# Patient Record
Sex: Male | Born: 1950
Health system: Southern US, Community
[De-identification: ages and names within clinical notes are randomized; demographics above are authoritative.]

## PROBLEM LIST (undated history)

## (undated) DIAGNOSIS — J189 Pneumonia, unspecified organism: Secondary | ICD-10-CM

## (undated) DIAGNOSIS — F1010 Alcohol abuse, uncomplicated: Secondary | ICD-10-CM

## (undated) DIAGNOSIS — T8859XA Other complications of anesthesia, initial encounter: Secondary | ICD-10-CM

## (undated) DIAGNOSIS — I509 Heart failure, unspecified: Secondary | ICD-10-CM

## (undated) DIAGNOSIS — E039 Hypothyroidism, unspecified: Secondary | ICD-10-CM

## (undated) DIAGNOSIS — I10 Essential (primary) hypertension: Secondary | ICD-10-CM

## (undated) DIAGNOSIS — C329 Malignant neoplasm of larynx, unspecified: Secondary | ICD-10-CM

## (undated) DIAGNOSIS — M1990 Unspecified osteoarthritis, unspecified site: Secondary | ICD-10-CM

## (undated) DIAGNOSIS — K7460 Unspecified cirrhosis of liver: Secondary | ICD-10-CM

## (undated) DIAGNOSIS — I779 Disorder of arteries and arterioles, unspecified: Secondary | ICD-10-CM

## (undated) DIAGNOSIS — N189 Chronic kidney disease, unspecified: Secondary | ICD-10-CM

## (undated) DIAGNOSIS — R569 Unspecified convulsions: Secondary | ICD-10-CM

## (undated) DIAGNOSIS — M87051 Idiopathic aseptic necrosis of right femur: Secondary | ICD-10-CM

## (undated) DIAGNOSIS — T4145XA Adverse effect of unspecified anesthetic, initial encounter: Secondary | ICD-10-CM

## (undated) DIAGNOSIS — E785 Hyperlipidemia, unspecified: Secondary | ICD-10-CM

## (undated) HISTORY — PX: TRACHEAL SURGERY: SHX1096

## (undated) HISTORY — DX: Pneumonia, unspecified organism: J18.9

## (undated) HISTORY — PX: TRACHEOSTOMY: SUR1362

## (undated) HISTORY — PX: ANKLE FRACTURE SURGERY: SHX122

## (undated) HISTORY — DX: Chronic kidney disease, unspecified: N18.9

---
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
## 2007-10-29 ENCOUNTER — Ambulatory Visit: Payer: Self-pay | Admitting: Family Medicine

## 2007-10-29 ENCOUNTER — Observation Stay (HOSPITAL_COMMUNITY): Admission: EM | Admit: 2007-10-29 | Discharge: 2007-10-30 | Payer: Self-pay | Admitting: Emergency Medicine

## 2007-10-30 LAB — CONVERTED CEMR LAB
HDL: 47 mg/dL
LDL Cholesterol: 90 mg/dL

## 2007-11-06 ENCOUNTER — Ambulatory Visit: Payer: Self-pay

## 2007-11-06 ENCOUNTER — Encounter: Payer: Self-pay | Admitting: Sports Medicine

## 2007-11-08 ENCOUNTER — Encounter: Payer: Self-pay | Admitting: Sports Medicine

## 2007-11-08 ENCOUNTER — Ambulatory Visit: Payer: Self-pay | Admitting: Family Medicine

## 2007-11-08 DIAGNOSIS — I10 Essential (primary) hypertension: Secondary | ICD-10-CM | POA: Insufficient documentation

## 2007-11-08 LAB — CONVERTED CEMR LAB
ALT: 20 U/L (ref 0–53)
AST: 33 units/L (ref 0–37)
Albumin: 4 g/dL (ref 3.5–5.2)
Alkaline Phosphatase: 132 units/L — ABNORMAL HIGH (ref 39–117)
BUN: 25 mg/dL — ABNORMAL HIGH (ref 6–23)
Bilirubin, Direct: 0.3 mg/dL (ref 0.0–0.3)
CO2: 25 meq/L (ref 19–32)
Calcium: 9.2 mg/dL (ref 8.4–10.5)
Chloride: 96 meq/L (ref 96–112)
Creatinine, Ser: 1.14 mg/dL
Creatinine, Ser: 1.14 mg/dL (ref 0.40–1.50)
Free T4: 0.95 ng/dL (ref 0.89–1.80)
Glucose, Bld: 88 mg/dL (ref 70–99)
Indirect Bilirubin: 1.2 mg/dL — ABNORMAL HIGH (ref 0.0–0.9)
PSA: 0.44 ng/mL (ref 0.10–4.00)
Potassium: 3.5 meq/L
Potassium: 3.5 meq/L (ref 3.5–5.3)
Sodium: 136 meq/L (ref 135–145)
T3, Free: 2.3 pg/mL (ref 2.3–4.2)
TSH: 3.527 u[IU]/mL (ref 0.350–4.50)
Total Bilirubin: 1.5 mg/dL — ABNORMAL HIGH (ref 0.3–1.2)
Total Protein: 8.3 g/dL (ref 6.0–8.3)

## 2007-11-09 DIAGNOSIS — I10 Essential (primary) hypertension: Secondary | ICD-10-CM | POA: Insufficient documentation

## 2007-11-21 ENCOUNTER — Encounter: Payer: Self-pay | Admitting: Sports Medicine

## 2007-12-30 ENCOUNTER — Observation Stay (HOSPITAL_COMMUNITY): Admission: EM | Admit: 2007-12-30 | Discharge: 2007-12-31 | Payer: Self-pay | Admitting: Emergency Medicine

## 2007-12-30 ENCOUNTER — Ambulatory Visit: Payer: Self-pay | Admitting: Family Medicine

## 2007-12-31 ENCOUNTER — Encounter: Payer: Self-pay | Admitting: Family Medicine

## 2007-12-31 DIAGNOSIS — J984 Other disorders of lung: Secondary | ICD-10-CM

## 2008-01-07 ENCOUNTER — Ambulatory Visit: Payer: Self-pay | Admitting: Family Medicine

## 2008-01-07 ENCOUNTER — Encounter: Payer: Self-pay | Admitting: *Deleted

## 2008-01-10 ENCOUNTER — Telehealth: Payer: Self-pay | Admitting: *Deleted

## 2008-11-14 ENCOUNTER — Encounter (INDEPENDENT_AMBULATORY_CARE_PROVIDER_SITE_OTHER): Payer: Self-pay | Admitting: *Deleted

## 2008-11-14 DIAGNOSIS — F17200 Nicotine dependence, unspecified, uncomplicated: Secondary | ICD-10-CM

## 2009-03-06 DIAGNOSIS — C329 Malignant neoplasm of larynx, unspecified: Secondary | ICD-10-CM

## 2009-03-06 HISTORY — DX: Malignant neoplasm of larynx, unspecified: C32.9

## 2009-04-16 ENCOUNTER — Ambulatory Visit: Payer: Self-pay | Admitting: Family Medicine

## 2009-04-16 ENCOUNTER — Encounter: Payer: Self-pay | Admitting: Sports Medicine

## 2009-04-16 LAB — CONVERTED CEMR LAB: Rapid Strep: NEGATIVE

## 2009-04-17 LAB — CONVERTED CEMR LAB
BUN: 9 mg/dL (ref 6–23)
CO2: 26 meq/L (ref 19–32)
Calcium: 8.6 mg/dL (ref 8.4–10.5)
Chloride: 100 meq/L (ref 96–112)
Creatinine, Ser: 0.7 mg/dL (ref 0.40–1.50)
Glucose, Bld: 82 mg/dL (ref 70–99)
Potassium: 3.3 meq/L — ABNORMAL LOW (ref 3.5–5.3)
Sodium: 141 meq/L (ref 135–145)

## 2009-04-19 ENCOUNTER — Ambulatory Visit (HOSPITAL_COMMUNITY): Admission: RE | Admit: 2009-04-19 | Discharge: 2009-04-19 | Payer: Self-pay | Admitting: Sports Medicine

## 2009-04-22 ENCOUNTER — Ambulatory Visit (HOSPITAL_COMMUNITY): Admission: RE | Admit: 2009-04-22 | Discharge: 2009-04-22 | Payer: Self-pay | Admitting: Sports Medicine

## 2009-04-22 ENCOUNTER — Encounter: Payer: Self-pay | Admitting: Sports Medicine

## 2009-04-30 ENCOUNTER — Ambulatory Visit: Payer: Self-pay | Admitting: Family Medicine

## 2009-04-30 ENCOUNTER — Encounter: Payer: Self-pay | Admitting: Sports Medicine

## 2009-05-03 ENCOUNTER — Encounter: Payer: Self-pay | Admitting: *Deleted

## 2009-05-10 ENCOUNTER — Encounter (INDEPENDENT_AMBULATORY_CARE_PROVIDER_SITE_OTHER): Payer: Self-pay | Admitting: *Deleted

## 2009-05-25 ENCOUNTER — Telehealth (INDEPENDENT_AMBULATORY_CARE_PROVIDER_SITE_OTHER): Payer: Self-pay | Admitting: *Deleted

## 2009-05-28 ENCOUNTER — Encounter: Payer: Self-pay | Admitting: Sports Medicine

## 2009-05-31 ENCOUNTER — Telehealth: Payer: Self-pay | Admitting: Sports Medicine

## 2009-06-14 ENCOUNTER — Telehealth: Payer: Self-pay | Admitting: Sports Medicine

## 2009-06-15 ENCOUNTER — Encounter: Payer: Self-pay | Admitting: Sports Medicine

## 2009-07-02 ENCOUNTER — Telehealth (INDEPENDENT_AMBULATORY_CARE_PROVIDER_SITE_OTHER): Payer: Self-pay | Admitting: *Deleted

## 2009-07-05 ENCOUNTER — Encounter: Payer: Self-pay | Admitting: Sports Medicine

## 2009-07-05 ENCOUNTER — Ambulatory Visit: Payer: Self-pay | Admitting: Family Medicine

## 2009-07-06 ENCOUNTER — Encounter: Payer: Self-pay | Admitting: Sports Medicine

## 2009-07-06 ENCOUNTER — Ambulatory Visit: Payer: Self-pay | Admitting: Family Medicine

## 2009-07-06 LAB — CONVERTED CEMR LAB
ALT: 14 U/L (ref 0–53)
AST: 27 U/L (ref 0–37)
Albumin: 3.4 g/dL — ABNORMAL LOW (ref 3.5–5.2)
Alkaline Phosphatase: 138 U/L — ABNORMAL HIGH (ref 39–117)
BUN: 20 mg/dL (ref 6–23)
CO2: 27 meq/L (ref 19–32)
Calcium: 9.2 mg/dL (ref 8.4–10.5)
Chloride: 95 meq/L — ABNORMAL LOW (ref 96–112)
Creatinine, Ser: 0.83 mg/dL (ref 0.40–1.50)
Glucose, Bld: 98 mg/dL (ref 70–99)
HCT: 38.5 % — ABNORMAL LOW (ref 39.0–52.0)
Hemoglobin: 13.5 g/dL (ref 13.0–17.0)
MCHC: 35.1 g/dL (ref 30.0–36.0)
MCV: 89.7 fL (ref 78.0–100.0)
Platelets: 236 10*3/uL (ref 150–400)
Potassium: 3.6 meq/L (ref 3.5–5.3)
RBC: 4.29 M/uL (ref 4.22–5.81)
RDW: 15.4 % (ref 11.5–15.5)
Sodium: 133 meq/L — ABNORMAL LOW (ref 135–145)
TSH: 2.399 u[IU]/mL (ref 0.350–4.500)
Total Bilirubin: 0.7 mg/dL (ref 0.3–1.2)
Total Protein: 7.4 g/dL (ref 6.0–8.3)
WBC: 6.6 10*3/microliter (ref 4.0–10.5)

## 2009-07-07 ENCOUNTER — Encounter: Payer: Self-pay | Admitting: Sports Medicine

## 2009-07-22 ENCOUNTER — Telehealth (INDEPENDENT_AMBULATORY_CARE_PROVIDER_SITE_OTHER): Payer: Self-pay | Admitting: Family Medicine

## 2009-07-23 ENCOUNTER — Encounter: Payer: Self-pay | Admitting: Sports Medicine

## 2009-08-12 ENCOUNTER — Encounter: Payer: Self-pay | Admitting: Sports Medicine

## 2009-09-08 ENCOUNTER — Encounter: Payer: Self-pay | Admitting: Sports Medicine

## 2009-09-14 ENCOUNTER — Encounter: Payer: Self-pay | Admitting: Sports Medicine

## 2009-09-23 ENCOUNTER — Encounter: Payer: Self-pay | Admitting: Sports Medicine

## 2009-09-29 ENCOUNTER — Encounter: Payer: Self-pay | Admitting: Sports Medicine

## 2009-10-05 ENCOUNTER — Ambulatory Visit
Admission: RE | Admit: 2009-10-05 | Discharge: 2010-01-03 | Payer: Self-pay | Source: Home / Self Care | Admitting: Radiation Oncology

## 2009-10-18 ENCOUNTER — Encounter: Payer: Self-pay | Admitting: Sports Medicine

## 2009-10-28 ENCOUNTER — Ambulatory Visit: Payer: Self-pay | Admitting: Oncology

## 2009-11-04 LAB — CBC WITH DIFFERENTIAL/PLATELET
BASO%: 0.4 % (ref 0.0–2.0)
Basophils Absolute: 0 10*3/uL (ref 0.0–0.1)
Eosinophils Absolute: 0.1 10*3/uL (ref 0.0–0.5)
HCT: 38 % — ABNORMAL LOW (ref 38.4–49.9)
HGB: 12.4 g/dL — ABNORMAL LOW (ref 13.0–17.1)
MCHC: 32.6 g/dL (ref 32.0–36.0)
MCV: 91.2 fL (ref 79.3–98.0)
Platelets: 169 10*3/uL (ref 140–400)
RBC: 4.17 10*6/uL — ABNORMAL LOW (ref 4.20–5.82)
RDW: 17.2 % — ABNORMAL HIGH (ref 11.0–14.6)
lymph#: 2.1 10*3/uL (ref 0.9–3.3)

## 2009-11-04 LAB — COMPREHENSIVE METABOLIC PANEL
Albumin: 3.3 g/dL — ABNORMAL LOW (ref 3.5–5.2)
BUN: 12 mg/dL (ref 6–23)
Calcium: 8.5 mg/dL (ref 8.4–10.5)
Chloride: 96 mEq/L (ref 96–112)
Glucose, Bld: 60 mg/dL — ABNORMAL LOW (ref 70–99)

## 2009-11-09 ENCOUNTER — Emergency Department (HOSPITAL_COMMUNITY): Admission: EM | Admit: 2009-11-09 | Discharge: 2009-11-09 | Payer: Self-pay | Admitting: Emergency Medicine

## 2009-11-09 LAB — COMPREHENSIVE METABOLIC PANEL
Calcium: 8.1 mg/dL — ABNORMAL LOW (ref 8.4–10.5)
Creatinine, Ser: 0.85 mg/dL (ref 0.40–1.50)
Potassium: 2.6 mEq/L — CL (ref 3.5–5.3)
Sodium: 134 mEq/L — ABNORMAL LOW (ref 135–145)

## 2009-11-10 LAB — HEPATITIS PANEL, ACUTE
HCV Ab: NEGATIVE
Hepatitis B Surface Ag: NEGATIVE

## 2009-11-12 ENCOUNTER — Ambulatory Visit (HOSPITAL_COMMUNITY): Admission: RE | Admit: 2009-11-12 | Discharge: 2009-11-12 | Payer: Self-pay | Admitting: Oncology

## 2009-11-19 LAB — HEPATIC FUNCTION PANEL
ALT: 125 U/L — ABNORMAL HIGH (ref 0–53)
AST: 95 U/L — ABNORMAL HIGH (ref 0–37)
Albumin: 3.1 g/dL — ABNORMAL LOW (ref 3.5–5.2)
Alkaline Phosphatase: 571 U/L — ABNORMAL HIGH (ref 39–117)
Total Bilirubin: 0.7 mg/dL (ref 0.3–1.2)

## 2009-11-19 LAB — BASIC METABOLIC PANEL
BUN: 8 mg/dL (ref 6–23)
CO2: 28 mEq/L (ref 19–32)
Chloride: 95 mEq/L — ABNORMAL LOW (ref 96–112)
Creatinine, Ser: 0.7 mg/dL (ref 0.40–1.50)
Glucose, Bld: 61 mg/dL — ABNORMAL LOW (ref 70–99)

## 2009-11-29 ENCOUNTER — Ambulatory Visit: Payer: Self-pay | Admitting: Oncology

## 2009-12-01 LAB — COMPREHENSIVE METABOLIC PANEL
ALT: 198 U/L — ABNORMAL HIGH (ref 0–53)
Albumin: 2.9 g/dL — ABNORMAL LOW (ref 3.5–5.2)
Alkaline Phosphatase: 560 U/L — ABNORMAL HIGH (ref 39–117)
BUN: 15 mg/dL (ref 6–23)
Calcium: 8.1 mg/dL — ABNORMAL LOW (ref 8.4–10.5)
Creatinine, Ser: 0.86 mg/dL (ref 0.40–1.50)
Potassium: 3.1 mEq/L — ABNORMAL LOW (ref 3.5–5.3)
Sodium: 137 mEq/L (ref 135–145)
Total Bilirubin: 0.9 mg/dL (ref 0.3–1.2)

## 2009-12-13 ENCOUNTER — Ambulatory Visit (HOSPITAL_COMMUNITY)
Admission: RE | Admit: 2009-12-13 | Discharge: 2009-12-13 | Payer: Self-pay | Source: Home / Self Care | Admitting: Oncology

## 2009-12-16 ENCOUNTER — Encounter: Payer: Self-pay | Admitting: Sports Medicine

## 2009-12-16 LAB — COMPREHENSIVE METABOLIC PANEL
ALT: 132 U/L — ABNORMAL HIGH (ref 0–53)
AST: 163 U/L — ABNORMAL HIGH (ref 0–37)
BUN: 4 mg/dL — ABNORMAL LOW (ref 6–23)
CO2: 29 mEq/L (ref 19–32)
Calcium: 8.5 mg/dL (ref 8.4–10.5)
Creatinine, Ser: 0.91 mg/dL (ref 0.40–1.50)
Total Protein: 7.5 g/dL (ref 6.0–8.3)

## 2010-01-03 ENCOUNTER — Ambulatory Visit
Admission: RE | Admit: 2010-01-03 | Discharge: 2010-01-07 | Payer: Self-pay | Source: Home / Self Care | Admitting: Radiation Oncology

## 2010-01-10 ENCOUNTER — Encounter: Payer: Self-pay | Admitting: Sports Medicine

## 2010-01-10 DIAGNOSIS — C329 Malignant neoplasm of larynx, unspecified: Secondary | ICD-10-CM

## 2010-01-19 ENCOUNTER — Ambulatory Visit
Admission: RE | Admit: 2010-01-19 | Discharge: 2010-01-19 | Payer: Self-pay | Source: Home / Self Care | Admitting: Radiation Oncology

## 2010-01-19 LAB — BUN: BUN: 6 mg/dL (ref 6–23)

## 2010-02-18 ENCOUNTER — Ambulatory Visit (HOSPITAL_COMMUNITY): Admission: RE | Admit: 2010-02-18 | Payer: Self-pay | Source: Home / Self Care | Admitting: Radiation Oncology

## 2010-03-14 ENCOUNTER — Encounter: Payer: Self-pay | Admitting: *Deleted

## 2010-03-25 ENCOUNTER — Other Ambulatory Visit: Payer: Self-pay | Admitting: Radiation Oncology

## 2010-03-26 ENCOUNTER — Encounter: Payer: Self-pay | Admitting: Oncology

## 2010-03-27 ENCOUNTER — Encounter: Payer: Self-pay | Admitting: Radiation Oncology

## 2010-03-27 ENCOUNTER — Encounter: Payer: Self-pay | Admitting: Oncology

## 2010-03-30 ENCOUNTER — Encounter: Payer: Self-pay | Admitting: Sports Medicine

## 2010-04-05 NOTE — Miscellaneous (Signed)
Summary: Disability Forms  Papers left to be filled out for disability.  Please fax when completed and call pt to pick up a copy. [name]  Jul 05, 2009 5:18 PM   tried to call pt to make appt to get these forms done. # is not working, other one he does not work there. [phone] did work. he will be here 5/12 to see md to fill these forms out.(fprms are in md chart box).Golden Circle RN  Jul 06, 2009 8:41 AM  Filled out and in to-do box. Rodney Langton MD  Jul 07, 2009 5:13 PM

## 2010-04-05 NOTE — Letter (Signed)
Summary: [facility] - Discharge Summary  [facility] - Discharge Summary   Imported By: De Nurse 10/21/2009 15:01:23  _____________________________________________________________________  External Attachment:    Type:   Image     Comment:   External Document

## 2010-04-05 NOTE — Consult Note (Signed)
Summary: [facility]   Imported By: Clydell Hakim 10/27/2009 11:36:57  _____________________________________________________________________  External Attachment:    Type:   Image     Comment:   External Document

## 2010-04-05 NOTE — Miscellaneous (Signed)
  Clinical Lists Changes  Problems: Removed problem of DYSPHAGIA UNSPECIFIED (ICD-787.20) Removed problem of SORE THROAT (ICD-462) Removed problem of SYNCOPE (ICD-780.2) Removed problem of CHEST PAIN, HX OF (ICD-V12.50) Removed problem of SPECIAL SCREENING FOR MALIGNANT NEOPLASMS COLON (ICD-V76.51) Removed problem of WEIGHT LOSS (ICD-783.21) Added new problem of NEOPLASM, MALIGNANT, LARYNX (ICD-161.9)

## 2010-04-05 NOTE — Consult Note (Signed)
Summary: [facility]   Imported By: De Nurse 09/15/2009 10:11:34  _____________________________________________________________________  External Attachment:    Type:   Image     Comment:   External Document

## 2010-04-05 NOTE — Progress Notes (Signed)
Summary: question  Phone Note Call from Patient Call back at [phone]   Caller: [name]- Daughter Summary of Call: has a secondary ins and wants to know if this will cover surgery?  Initial call taken by: De Nurse,  July 02, 2009 11:24 AM  Follow-up for Phone Call        left message to call back. Follow-up by: Theresia Lo RN,  July 02, 2009 12:16 PM  Additional Follow-up for Phone Call Additional follow up Details #1::        spoke with [name] and she has had new insurance card faxed to [place].  Insurance is [employer]. advised [name] to contact insurance company to see if there are any neurosurgeons in the area that participate with this insurance. Additional Follow-up by: Theresia Lo RN,  Jul 05, 2009 2:10 PM

## 2010-04-05 NOTE — Consult Note (Signed)
Summary[facility]   Imported By: Clydell Hakim 08/06/2009 16:19:26  _____________________________________________________________________  External Attachment:    Type:   Image     Comment:   External Document  Appended Document: [facility]    Clinical Lists Changes  Observations: Added new observation of PAST MED HX: Hypertension  04/19/09: Repeat CT chest: previous nodule stable, but multiple new nodules.  Centrilobular emphysema.  Repeat CT chest in 6-12 months needed.   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 (08/07/2009 10:11)       Past History:  Past Medical History: Hypertension  04/19/09: Repeat CT chest: previous nodule stable, but multiple new nodules.  Centrilobular emphysema.  Repeat CT chest in 6-12 months needed.   Primary Anatomic Dysphagia, to [facility]NT 07/2009, large tumor seen in supraglottic region, workup underway for tissue dx and staging.

## 2010-04-05 NOTE — Assessment & Plan Note (Signed)
Summary: sore throat,df   Vital Signs:  Patient profile:   60 year old male Weight:      114 pounds Temp:     97.4 degrees F oral Pulse rate:   102 / minute BP sitting:   147 / 104  (right arm) Cuff size:   regular  Vitals Entered By: Gladstone Pih (April 16, 2009 1:40 PM)  Serial Vital Signs/Assessments:  Time      Position  BP       Pulse  Resp  Temp     By 1:42 PM             152/102                        Gladstone Pih  Comments: 1:42 PM re check manually By: Gladstone Pih   CC: C/O sore throat X 1 month and Right earsche X 2 weeks Is Patient Diabetic? No Pain Assessment Patient in pain? no        Primary Care Provider:  Rodney Langton MD  CC:  C/O sore throat X 1 month and Right earsche X 2 weeks.  History of Present Illness: 74M  ST: x 1 month, trouble swallowing, feels like food gets stuck in neck.  Some reflux symptoms.  Pain in throat radiates to right ear.  No wt loss, fevers, chills.  Pulm nodule:  Seen on CTA in 2009, has not had repeat CT.  HTN: has not taken any of his BP meds in some time now.  Habits & Providers  Alcohol-Tobacco-Diet     Tobacco Status: current     Tobacco Counseling: to quit use of tobacco products     Cigarette Packs/Day: 0.5  Current Medications (verified): 1)  Metoprolol Tartrate 25 Mg Tabs (Metoprolol Tartrate) .... One Tab By Mouth Bid 2)  Nitroglycerin 0.4 Mg Subl (Nitroglycerin) .... 3 Tabs Sublingual As Needed For Chest Pain. 3)  Megestrol Acetate 400 Mg/71ml Susp (Megestrol Acetate) .Marland Kitchen.. 10 Ml By Mouth Daily Dispense Enough For One Month 4)  Adult Aspirin Ec Low Strength 81 Mg Tbec (Aspirin) .Marland Kitchen.. 1 Tab By Mouth Daily 5)  Nicotine 21-14-7 Mg/24hr Kit (Nicotine) .... Use Patches As Directed, Do Not Smoke When Using Patches 6)  Acetaminophen 650 Mg Cr-Tabs (Acetaminophen) .... One Tab By Mouth Q8h As Needed For Pain 7)  Prilosec 40 Mg Cpdr (Omeprazole) .... One Tab By Mouth Qhs  Allergies (verified): No Known  Drug Allergies  Social History: Packs/Day:  0.5  Review of Systems       See HPI  Physical Exam  General:  Well-developed,well-nourished,in no acute distress; alert,appropriate and cooperative throughout examination Head:  Normocephalic and atraumatic without obvious abnormalities.  Eyes:  No corneal or conjunctival inflammation noted. EOMI. Perrla.  Ears:  External ear exam shows no significant lesions or deformities.  Otoscopic examination reveals clear canals, tympanic membranes are intact bilaterally without bulging, retraction, inflammation or discharge. Hearing is grossly normal bilaterally. Nose:  External nasal examination shows no deformity or inflammation. Nasal mucosa are pink and moist without lesions or exudates. Mouth:  Oral mucosa and oropharynx without lesions or exudates.  Neck:  No deformities, masses, or tenderness noted. Lungs:  Normal respiratory effort, chest expands symmetrically. Lungs are clear to auscultation, no crackles or wheezes. Heart:  Normal rate and regular rhythm. S1 and S2 normal without gallop, murmur, click, rub or other extra sounds.   Impression & Recommendations:  Problem # 1:  SORE THROAT (  ICD-462) Assessment New Unclear etiology, strep neg, no URI symptoms.  Will tx for GERD and do MBS to assess anatomy (concern for Zenker Diverticulum).  If no improvement with PPI and neg MBS, would refer to ENT for direct laryngoscopy to assess for malignant lesion (smoker).  Acetaminophen for pain.  His updated medication list for this problem includes:    Acetaminophen 650 Mg Cr-tabs (Acetaminophen) ..... One tab by mouth q8h as needed for pain  Orders: Rapid Strep-[facility] (10272) [facility]- Est  Level 4 (99214) Mod Barium Swallow (Mod Barium Swallow)  Problem # 2:  DYSPHAGIA UNSPECIFIED (ICD-787.20) Assessment: New See Above: #1  Orders: [facility]- Est  Level 4 (99214) Mod Barium Swallow (Mod Barium Swallow)  Problem # 3:  TOBACCO USER  (ICD-305.1) Assessment: Unchanged 2 cigarettes/day.  Counselled to quit.  His updated medication list for this problem includes:    Nicotine 21-14-7 Mg/24hr Kit (Nicotine) ..... Use patches as directed, do not smoke when using patches  Orders: [facility]- Est  Level 4 (53664)  Problem # 4:  PULMONARY NODULE (ICD-518.89) Assessment: Unchanged Fu pulm nodules on CT in 2009.  Checking BMET today for contrast.  Orders: CT with Contrast (CT w/ contrast)  Problem # 5:  HYPERTENSION (ICD-401.9) Assessment: Deteriorated Pt not taking any meds.  BP elevated.  WIll start low dose toprol. BMET today.  His updated medication list for this problem includes:    Metoprolol Tartrate 25 Mg Tabs (Metoprolol tartrate) ..... One tab by mouth bid  Orders: Basic Met-[facility] (40347-42595) [facility]- Est  Level 4 (63875)  Complete Medication List: 1)  Metoprolol Tartrate 25 Mg Tabs (Metoprolol tartrate) .... One tab by mouth bid 2)  Nitroglycerin 0.4 Mg Subl (Nitroglycerin) .... 3 tabs sublingual as needed for chest pain. 3)  Megestrol Acetate 400 Mg/92ml Susp (Megestrol acetate) .Marland Kitchen.. 10 ml by mouth daily dispense enough for one month 4)  Adult Aspirin Ec Low Strength 81 Mg Tbec (Aspirin) .Marland Kitchen.. 1 tab by mouth daily 5)  Nicotine 21-14-7 Mg/24hr Kit (Nicotine) .... Use patches as directed, do not smoke when using patches 6)  Acetaminophen 650 Mg Cr-tabs (Acetaminophen) .... One tab by mouth q8h as needed for pain 7)  Prilosec 40 Mg Cpdr (Omeprazole) .... One tab by mouth qhs  Patient Instructions: 1)  Get the barium swallow. 2)  Take the prilosec 3)  Go to lab for bmet 4)  Make an appt to see me as soon as the barium swallow study is done. 5)  Take the new BP medicine metoprolol, baby aspirin a day, acetaminophen for pain. 6)  -Dr. Karie Schwalbe. Prescriptions: PRILOSEC 40 MG CPDR (OMEPRAZOLE) One tab by mouth qHS  #30 x 6   Entered and Authorized by:   Rodney Langton MD   Signed by:   Rodney Langton MD on  04/16/2009   Method used:   Electronically to        [facility] [id number] (retail)       [address] [place], [place]  [address]       Ph: [phone]       Fax: [phone]   RxID:   [id number] ACETAMINOPHEN 650 MG CR-TABS (ACETAMINOPHEN) One tab by mouth q8h as needed for pain  #30 x 0   Entered and Authorized by:   Rodney Langton MD   Signed by:   Rodney Langton MD on 04/16/2009   Method used:  Electronically to        [facility] [address] (retail)       [address] [place], [place]  [address]       Ph: [phone]       Fax: [phone]   RxID:   [id number] ADULT ASPIRIN EC LOW STRENGTH 81 MG TBEC (ASPIRIN) 1 tab by mouth daily  #90 x 11   Entered and Authorized by:   Rodney Langton MD   Signed by:   Rodney Langton MD on 04/16/2009   Method used:   Electronically to        [facility] [address] (retail)       [address]       [place], [place]  [address]       Ph: [phone]       Fax: [phone]   RxID:   [id number] METOPROLOL TARTRATE 25 MG TABS (METOPROLOL TARTRATE) One tab by mouth BID  #60 x 0   Entered and Authorized by:   Rodney Langton MD   Signed by:   Rodney Langton MD on 04/16/2009   Method used:   Electronically to        [facility] [address] (retail)       [address]       [place], [place]  [address]       Ph: [phone]       Fax: [phone]   RxID:   [id number]    Laboratory Results  Date/Time Received: April 16, 2009 1:50 PM  Date/Time Reported: April 16, 2009 1:59 PM   Other Tests  Rapid Strep: negative Comments: ...........test performed by...........Marland KitchenTerese Door, CMA

## 2010-04-05 NOTE — Miscellaneous (Signed)
Summary: Re: ENT referral.  Clinical Lists Changes patient has no insurance and he does have Debra Hill's number to get in touch with her to see if her qualifies. he requests we hold off on ENT referral until he has the chance to talk with Jaynee Eagles. will notifiy MD . Theresia Lo RN  May 10, 2009 11:00 AM   Noted, please have him let me know when he has spoken to her, referral order and letter are in [name] for when he's ready for referral. Rodney Langton MD  May 11, 2009 10:11 AM

## 2010-04-05 NOTE — Consult Note (Signed)
[facility]   Imported By: Clydell Hakim 12/27/2009 16:57:18  _____________________________________________________________________  External Attachment:    Type:   Image     Comment:   External Document

## 2010-04-05 NOTE — Miscellaneous (Signed)
 Summary: hospital summary  Clinical Lists Changes NAME:  [name], [name] NO.:  [id number]      MEDICAL RECORD NO.:  [id number]          PATIENT TYPE:  OBV      LOCATION:  4735                         FACILITY:  [facility]      PHYSICIAN:  Camie LITTIE Mulch, MD        DATE OF BIRTH:  1950-05-08      DATE OF ADMISSION:  10/29/2007   DATE OF DISCHARGE:  10/30/2007                                  DISCHARGE SUMMARY      PRIMARY CARE PROVIDER:  None.      REASON FOR ADMISSION:  Chest pain and hypertensive urgency.      BRIEF HISTORY:  This is a 60 year old male with chest pain that began   Friday, October 25, 2007, after picking the dehumidifier at work.   Gradually increased to 7/10 over the day, located over the left chest,   just below the nipple and radiates to the left flank and not associated   with physical exertion, worsened by palpation and seems to move when   the patient lies down.  Also, the patient complained of headache, some   nausea, vomiting x1 this morning.  He states what made him come to the   ED today was just that the pain had not gotten better.  Incidentally, he   was found to have a blood pressure of 217/120 in the ED and the family   practice teaching service was called.  The patient got labetalol  20 mg   IV x1 and a nitroglycerin  drip and then the blood pressure dropped to   187/110s in the ED.      BRIEF HOSPITAL COURSE:  The patient was brought on to the floor to rule   out MI.  He did well on the floor.  There were no acute events and by   discharge, the chest pain and headache had completely resolved.      PERTINENT LABORATORIES:  The patient had a fasting lipid panel done.   Total cholesterol was 145, triglycerides 39, HDL 47, and LDL 90.   Cardiac enzymes were negative x2 and urine drug screen was negative   except for cannabinoids.  EKG showed normal sinus rhythm.  Also of note,   the ED did a CT of the head for the patient's headache, which  came back   negative, and the patient's chest x-ray was also negative.  CBC and   complete metabolic panel was also negative.      DISCHARGE CONDITION:  Stable and improved.      DISCHARGE DISPOSITION:  The patient will be discharged to home.      DISCHARGE MEDICATIONS:  The patient took no medications on admission and   will be discharged with Lopressor HCT 25/100 PO daily, nitroglycerin    0.4mg  SL prn chest pain, and aspirin  81 mg p.o. daily.      INSTRUCTIONS:  No physical exertion until outpatient stress test.      FOLLOWUP:  He will follow up with me Dr. Curtis at the [facility] on November 08, 2007, at 3:30 p.m.  He will   follow up at [facility] for an outpatient stress test, November 06, 2007, at 12'o clock noon.  The number there is [phone].               Debby Petties, MD   Electronically Signed               Camie LITTIE Mulch, MD   Electronically Signed         TT/MEDQ  D:  10/30/2007  T:  10/30/2007  Job:  [id number]

## 2010-04-05 NOTE — Progress Notes (Signed)
Summary: referral  Phone Note Call from Patient Call back at [phone]   Caller: Patient Summary of Call: wants to know about referral Initial call taken by: De Nurse,  June 14, 2009 1:40 PM  Follow-up for Phone Call        explained to patient that we have sent referral to [facility] for Neurosurgeon and are still waiting to see if an appointment can be scheduled. no appointment with ENT were available to be scheduled due to lack of volunteer MD with this speciality.  he states that he is having much difficulty swallowing liquids and unable to eat. if he drinks water it burns and he gets strangled easily. he is taking in 3 Boost daily only. will send message to Dr. Benjamin Stain  to please advise.  Follow-up by: Theresia Lo RN,  June 14, 2009 2:15 PM  Additional Follow-up for Phone Call Additional follow up Details #1::        Very hungry, lots of trouble swallowing, can only take liquids.  Cannot swallow solids.  Advised to make appt to see me in a month, if hasn't gained weight would need to admit for FTT and consult ENT vs Neurosurg in the hospital. Additional Follow-up by: Rodney Langton MD,  June 14, 2009 3:26 PM

## 2010-04-05 NOTE — Progress Notes (Signed)
Summary: needs referral  Phone Note Call from Patient Call back at Home Phone [phone]   Caller: Patient Summary of Call: [facility] needs auth for him to go and he has appt tomorrow. Initial call taken by: De Nurse,  Jul 22, 2009 2:28 PM  Follow-up for Phone Call        Faxed referral to [phone] Follow-up by: Gladstone Pih,  Jul 22, 2009 4:43 PM

## 2010-04-05 NOTE — Letter (Signed)
Summary: Generic Letter  [facility]  [address]   [place], [place] [address]   Phone: [phone]  Fax: [phone]    05/03/2009  [name] [address] [place], [place]  [address]  Dear Mr. KONOPKA,     Your doctor wanted [place] to schedule an appointment with an Ear , Nose and Throat specialist. I was unable to contact you by phone. Please call our office at your earliest convenience. Also I need to ask if you have any insurance or  will you   be self pay before I make the referral.     Thank You.        Sincerely,   Theresia Lo RN

## 2010-04-05 NOTE — Assessment & Plan Note (Signed)
 Summary: np/h fu /per thekkandam/wp   Vital Signs:  Patient Profile:   60 Years Old Male Weight:      121.5 pounds Temp:     98.3 degrees F Pulse rate:   93 / minute BP sitting:   108 / 83  (left arm)  Vitals Entered By: LETITIA REUSING (November 08, 2007 3:40 PM)             Is Patient Diabetic? No     PCP:  Raela Bohl  Chief Complaint:  hospital f/u and meet new doctor.  History of Present Illness: 60 year old male with hypertension, chest pain, weight loss presents for hospital follow up after coming to the ED for chest pain, rule out MI, and hypertensive urgency.  He has only had one further episode of chest pain, relieved by his NTG.  He also completed his stress test at [facility].  I will have this faxed over so that I may address the results at his next visit.  No other complaints other than weight loss despite a good appetite.  No HA, blurry vision, weakness, cough, sore throat, SOB, N/V/D/C, melena, hematochezia, dysuria, night sweats.    Current Allergies: No known allergies   Past Medical History:    Hypertension  Past Surgical History:    The patient had a right ankle ORIF many years      ago at [facility]   Family History:    Father died of cancer of unknown type.  Mother has a      heart murmur and stroke.  Brother died of cancer.  Another brother died      of complications following esophageal surgery.  The patient has no      history of MI in the family.  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, lives with      his wife and 2 sons at their house, has a tobacco smoking history of one-      third of a pack per day for 30 years, drinks EtOH approximately 1 shot      on the weekends, and smokes marijuana occasionally   Risk Factors:  Tobacco use:  current    Counseled to quit/cut down tobacco use:  yes  HDL Result Date:  10/30/2007 HDL Result:  47 HDL Next Due:  1 yr LDL Result Date:  10/30/2007 LDL Result:   90 LDL Next Due:  1 yr Colonoscopy Next Due:  10 yr PSA Result Date:  11/08/2007 Creatinine Result Date: 11/08/2007 Creatinine Result:  1.14 Potassium Result Date:  11/08/2007 Potassium Result:  3.5   Review of Systems       As above in HPI   Physical Exam  General:     Well-developed,Appears very thin ,in no acute distress; alert,appropriate and cooperative throughout examination Head:     Normocephalic and atraumatic without obvious abnormalities. Eyes:     No corneal or conjunctival inflammation noted. EOMI. Perrla. Ears:     External ear exam shows no significant lesions or deformities. Nose:     External nasal examination shows no deformity or inflammation. Mouth:     Oral mucosa and oropharynx without lesions or exudates.  Neck:     No deformities, masses, or tenderness noted. Chest Wall:     No deformities, masses, tenderness or gynecomastia noted. Lungs:     Normal respiratory effort, chest expands symmetrically. Lungs are clear to auscultation, no crackles or wheezes. Heart:  Normal rate and regular rhythm. S1 and S2 normal without gallop, murmur, click, rub or other extra sounds. Abdomen:     Bowel sounds positive,abdomen soft and non-tender without masses, organomegaly or hernias noted. Extremities:     No clubbing, cyanosis, edema, or deformity noted with normal full range of motion of all joints.   Neurologic:     Grossly non-focal Cervical Nodes:     No lymphadenopathy noted Axillary Nodes:     No palpable lymphadenopathy Inguinal Nodes:     No significant adenopathy    Impression & Recommendations:  Problem # 1:  HYPERTENSION (ICD-401.9) Assessment: Improved Well controlled on current medication regimen, no report of side effects, no changes in medication.  His updated medication list for this problem includes:    Lopressor Hct 100-25 Mg Tabs (Metoprolol-hydrochlorothiazide) .SABRA... 1 tab by mouth daily   Problem # 2:  CHEST PAIN, HX OF  (ICD-V12.50) One episode since hospitalization.  relieved by NTG.  Will have reports of [place] stress test faxed over and added to chart.  Lipids and blood pressure controlled, risk factors optimized.  Pt is a smoker and I will address this at his next visit.  Problem # 3:  WEIGHT LOSS (ICD-783.21) Unsure as to cause, will do basic screening (colonoscopy scheduled, will check PSA, will consider CT chest with history of smoking but current concerns include pt's ability to pay for a CT, CXR negative but low sensitivity)  Will check results of TFTs at next visit.  Alk phos and T bili slightly elevated.  Will consider RUQ ultrasound to rule out biliary tract pathology/dilatation.  Will also try megestrol  for wt gain in the meantime.  Plan to plant PPD also at next visit.  Orders: Free T3-[facility] [id number]) Free T4-[facility] [id number]) TSH-[facility] [phone]) PSA-[facility] [id number]) Hepatic-[facility] [id number]) Basic Met-[facility] [id number])   Problem # 4:  SPECIAL SCREENING FOR MALIGNANT NEOPLASMS COLON (ICD-V76.51) Scheduled, will f/u results when available.  Orders: Colonoscopy (Colon)   Complete Medication List: 1)  Lopressor Hct 100-25 Mg Tabs (Metoprolol-hydrochlorothiazide) .SABRA.. 1 tab by mouth daily 2)  Nitroglycerin  0.4 Mg Subl (Nitroglycerin ) .... 3 tabs sublingual as needed for chest pain. 3)  Megestrol  Acetate 400 Mg/10ml Susp (Megestrol  acetate) .SABRA.. 10 ml by mouth daily dispense enough for one month 4)  Adult Aspirin  Ec Low Strength 81 Mg Tbec (Aspirin ) .SABRA.. 1 tab by mouth daily   Patient Instructions: 1)  It was good to see you again [name].  Your blood pressure is well controlled, so I will continue your current BP meds.  I will also draw a few labs including PSA today to assess your weight loss, depending on the results, I may need to order further tests.  I will also do a colonoscopy for routine screening.  Lastly I need the results of your stress test at [facility] cardiology.  I will  have this faxed over so its available for your next visit.   2)  lastly I will try a medication to improve your appetite while I search for a cause of your weight loss: megestrol  400 mg daily. 3)  See you again in one month. 4)  -Dr. Curtis   Prescriptions: ADULT ASPIRIN  EC LOW STRENGTH 81 MG TBEC (ASPIRIN ) 1 tab by mouth daily  #30 x 6   Entered and Authorized by:   DEBBY CURTIS MD   Signed by:   DEBBY CURTIS MD on 11/08/2007   Method used:   Electronically to  [facility] [address] (retail)       [address] [place], [place]  [address]       Ph: [phone]       Fax: [phone]   RxID:   [id number] MEGESTROL  ACETATE 400 MG/10ML SUSP (MEGESTROL  ACETATE) 10 mL by mouth daily dispense enough for one month  #300 x 3   Entered and Authorized by:   DEBBY PETTIES MD   Signed by:   DEBBY PETTIES MD on 11/08/2007   Method used:   Electronically to        [facility] [address] (retail)       [address] [place], [place]  [address]       Ph: [phone]       Fax: [phone]   RxID:   [id number] NITROGLYCERIN  0.4 MG SUBL (NITROGLYCERIN ) 3 tabs sublingual as needed for chest pain.  #90 x 6   Entered and Authorized by:   DEBBY PETTIES MD   Signed by:   DEBBY PETTIES MD on 11/08/2007   Method used:   Electronically to        [facility] [address] (retail)       [address] [place], [place]  [address]       Ph: [phone]       Fax: [phone]   RxID:   [id number] LOPRESSOR HCT 100-25 MG TABS (METOPROLOL-HYDROCHLOROTHIAZIDE) 1 tab by mouth daily  #30 x 6   Entered and Authorized by:   DEBBY PETTIES MD   Signed by:   DEBBY PETTIES MD on 11/08/2007   Method used:   Electronically to        [facility] [address] (retail)       [address]       [place], [place]  [address]        Ph: [phone]       Fax: [phone]   RxID:   [id number] ADULT ASPIRIN  EC LOW STRENGTH 81 MG TBEC (ASPIRIN ) 1 tab by mouth daily  #30 x 6   Entered and Authorized by:   DEBBY PETTIES MD   Signed by:   DEBBY PETTIES MD on 11/08/2007   Method used:   Electronically to        [facility] [address] (retail)       [address]       [place], [place]  [address]       Ph: [phone]       Fax: [phone]   RxID:   [id number] MEGESTROL  ACETATE 400 MG/10ML SUSP (MEGESTROL  ACETATE) 10 mL by mouth daily dispense enough for one month  #300 x 6   Entered and Authorized by:   DEBBY PETTIES MD   Signed by:   DEBBY PETTIES MD on 11/08/2007   Method used:   Electronically to        [facility] [address] (retail)       [address]       [place], [place]  [address]  Ph: [phone]       Fax: [phone]   RxID:   [id number] NITROGLYCERIN  0.4 MG SUBL (NITROGLYCERIN ) 3 tabs sublingual as needed for chest pain.  #90 x 6   Entered and Authorized by:   DEBBY PETTIES MD   Signed by:   DEBBY PETTIES MD on 11/08/2007   Method used:   Electronically to        [facility] [address] (retail)       [address] [place], [place]  [address]       Ph: [phone]       Fax: [phone]   RxID:   [id number] LOPRESSOR HCT 100-25 MG TABS (METOPROLOL-HYDROCHLOROTHIAZIDE) 1 tab by mouth daily  #30 x 6   Entered and Authorized by:   DEBBY PETTIES MD   Signed by:   DEBBY PETTIES MD on 11/08/2007   Method used:   Electronically to        [facility] [address] (retail)       [address]       [place], [place]  [address]       Ph: [phone]       Fax: [phone]   RxID:   [id number]  ]

## 2010-04-05 NOTE — Letter (Signed)
Summary: [facility]   [facility]   Imported By: Clydell Hakim 09/03/2009 13:51:07  _____________________________________________________________________  External Attachment:    Type:   Image     Comment:   External Document

## 2010-04-05 NOTE — Letter (Signed)
Summary: [facility] - Total Laryngectomy  [facility]   Imported By: Clydell Hakim 10/01/2009 15:13:45  _____________________________________________________________________  External Attachment:    Type:   Image     Comment:   External Document

## 2010-04-05 NOTE — Miscellaneous (Signed)
Summary: Re: neurosurgeon referral  Clinical Lists Changes   received notification from [facility] that they are unable to complete neurosurgeon referral at this time due to lack of volunteer physicians in this speciality group. They will notify patient when they can process this referral. Theresia Lo RN  June 15, 2009 8:58 AM  Noted, please notify patient. Rodney Langton MD  June 15, 2009 8:53 PM   patient notified. Theresia Lo RN  June 16, 2009 9:48 AM

## 2010-04-05 NOTE — Consult Note (Signed)
Summary: [facility] - Discharge Summary  [facility] - Discharge Summary   Imported By: Clydell Hakim 10/27/2009 12:14:17  _____________________________________________________________________  External Attachment:    Type:   Image     Comment:   External Document

## 2010-04-05 NOTE — Consult Note (Signed)
Summary: Functional Problems  Functional Problems   Imported By: De Nurse 05/12/2009 11:18:56  _____________________________________________________________________  External Attachment:    Type:   Image     Comment:   External Document

## 2010-04-05 NOTE — Assessment & Plan Note (Signed)
 Summary: h fu per [facility]   Vital Signs:  Patient Profile:   60 Years Old Male Weight:      123.3 pounds Pulse rate:   106 / minute BP sitting:   153 / 109  (right arm)  Pt. in pain?   no  Vitals Entered By: JACK BLOODGOOD CMA, (January 07, 2008 1:43 PM)              Is Patient Diabetic? No     PCP:  Rafel Garde  Chief Complaint:  hospital f/up.  History of Present Illness: Hospital follow up for syncope.  Pt states that he had been cooking and then felt weak, next thing he knew he was on the floor waking up.  No witnessed seizure, no chest pain, was ruled out for MI and PE during admission.  No prior cough, SOB, CP, fevers/chills, N/V/C/D, abd pain, HA, URI symptoms.  His vitals were stable (temp was 100.62F).  Unlikely cardiac event, CVA, or seizure.  Likely vasovagal.  Since admission, has had no further events, completely asymptomatic.  Is gaining weight (3 lbs since last office visit) eating well, but still smoking 3 cigarettes a day.  Says is cutting down slowly and would like to try the nicotine patch.      Current Allergies: No known allergies   Past Medical History:    Reviewed history from 11/08/2007 and no changes required:       Hypertension  Past Surgical History:    Reviewed history from 11/08/2007 and no changes required:       The patient had a right ankle ORIF many years         ago at [facility]   Family History:    Reviewed history from 11/08/2007 and no changes required:       Father died of cancer of unknown type.  Mother has a         heart murmur and stroke.  Brother died of cancer.  Another brother died         of complications following esophageal surgery.  The patient has no         history of MI in the family.  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[place], lives with         his wife and 2 sons at their house, has a tobacco smoking history of one-           third of a pack per day for 30 years, currently smokes 3 cigarettes a day, drinks EtOH approximately 1 shot on the weekends, and smokes marijuana occasionally   Risk Factors:     Counseled to quit/cut down tobacco use:  yes   Review of Systems       See HPI    Physical Exam  General:     no acute distress; alert,appropriate and cooperative throughout examination, thin appearing but better and fuller face than last time I saw him in the office Head:     Normocephalic and atraumatic without obvious abnormalities. Eyes:     No corneal or conjunctival inflammation noted. EOMI. Perrla. Ears:     External ear exam shows no significant lesions or deformities.  Nose:     External nasal examination shows no deformity or inflammation. Mouth:     Oral mucosa and oropharynx without lesions or exudates.  Poor dentition Neck:  No deformities, masses, or tenderness noted. Lungs:     Normal respiratory effort, chest expands symmetrically. Lungs are clear to auscultation, no crackles or wheezes. Heart:     Tachy but regular rhythm. S1 and S2 normal without gallop, murmur, click, rub or other extra sounds. Neurologic:     Grossly non-focal Cervical Nodes:     No lymphadenopathy noted Axillary Nodes:     No palpable lymphadenopathy Inguinal Nodes:     No significant adenopathy    Impression & Recommendations:  Problem # 1:  SYNCOPE (ICD-780.2) No further episodes, ruled out for MI, CVA, PE at admission.  Could have been 2/2 antihypertensive meds, vasovagal, dehydration, mild viral infection (T: 100.2 at admission).  Will hold off on tilt-table test for now, however if another episode occurs, will do it to rule out neurocardiogenic syncope.  Recommended adequate daily hydration. Orders: [facility]- Est  Level 4 (00785)   Problem # 2:  HYPERTENSION (ICD-401.9) BP meds held at discharge, BP elevated today 150/109, pulse 106, will restart Lopressor 100/25 but at 1/2 the dose.  Pt  will break tabs in half and take one half tab daily.  I will see him again in 2 weeks to see how his BP is doing  and track for further side effects of meds.  His updated medication list for this problem includes:    Lopressor Hct 100-25 Mg Tabs (Metoprolol-hydrochlorothiazide) ..... One half tab by mouth daily   Problem # 3:  PULMONARY NODULE (ICD-518.89) Found on CT scan at admission, see radiology report.  Will recheck CT in 3 months to track size of nodules.  Also recommended smoking cessation and will send script for nicotine patches to pharmacy.  Will plant PPD 3 days prior to next appt.  Problem # 4:  WEIGHT LOSS (ICD-783.21) Pt has gained 3 lbs since last office visit on megace , appetite better.  Will follow for continued improvement.  Problem # 5:  SPECIAL SCREENING FOR MALIGNANT NEOPLASMS COLON (ICD-V76.51) Pt currently unable to afford colonoscopy, will refer to [facility] for assistance, if qualified will immediately schedule colonoscopy, currently no alarm symptoms (constipation, pencil stools, melena, hematochezia).  Complete Medication List: 1)  Lopressor Hct 100-25 Mg Tabs (Metoprolol-hydrochlorothiazide) .... One half tab by mouth daily 2)  Nitroglycerin  0.4 Mg Subl (Nitroglycerin ) .... 3 tabs sublingual as needed for chest pain. 3)  Megestrol  Acetate 400 Mg/10ml Susp (Megestrol  acetate) .SABRA.. 10 ml by mouth daily dispense enough for one month 4)  Adult Aspirin  Ec Low Strength 81 Mg Tbec (Aspirin ) .SABRA.. 1 tab by mouth daily 5)  Nicotine 21-14-7 Mg/24hr Kit (Nicotine) .... Use patches as directed, do not smoke when using patches   Patient Instructions: 1)  -Good to see you again. 2)  -Your blood pressure today was very high.  Take your blood pressure meds again, but this time break the pill in half and take once a day.   3)  -I will send you to [facility]. 4)  -Once [facility] qualified, we can get you a colonoscopy. 5)  -I plan to do another Chest CT scan in 3 months  (03/31/08) to track the size of the nodules found in your lungs. 6)  -Keep yourself well hydrated 7)  -Have my nurses make an appt to see me again in 2 weeks. 8)  -Dr. ONEIDA.   Prescriptions: NICOTINE 21-14-7 MG/24HR KIT (NICOTINE) Use patches as directed, do not smoke when using patches  #1 x 0   Entered and Authorized  by:   DEBBY PETTIES MD   Signed by:   DEBBY PETTIES MD on 01/07/2008   Method used:   Electronically to        [facility] [address] (retail)       [address] [place], [place]  [address]       Ph: [phone]       Fax: [phone]   RxID:   [id number]  ]

## 2010-04-05 NOTE — Letter (Signed)
Summary: Disability Paperwork  Medical Statement Mental Ability   Imported By: Clydell Hakim 08/06/2009 16:25:57  _____________________________________________________________________  External Attachment:    Type:   Image     Comment:   External Document

## 2010-04-05 NOTE — Progress Notes (Signed)
Summary: referral  Phone Note Call from Patient Call back at [phone]   Caller: Patient Summary of Call: pt is now ready to go to ENT- pls call when referral is ready pt has [facility] Initial call taken by: De Nurse,  May 25, 2009 9:40 AM  Follow-up for Phone Call        spoke with male person answering phone and she will bring card in for a copy to be made. Follow-up by: Theresia Lo RN,  May 25, 2009 10:17 AM

## 2010-04-05 NOTE — Consult Note (Signed)
Summary: [facility]   Imported By: Clydell Hakim 10/12/2009 14:43:18  _____________________________________________________________________  External Attachment:    Type:   Image     Comment:   External Document

## 2010-04-05 NOTE — Progress Notes (Signed)
Summary: referral  Phone Note Call from Patient Call back at [phone]   Caller: Shirl Harris- foster daughter Summary of Call: wants to know how much this surgery will cost Initial call taken by: De Nurse,  May 31, 2009 2:58 PM  Follow-up for Phone Call        message left to return call. Follow-up by: Theresia Lo RN,  May 31, 2009 3:20 PM  Additional Follow-up for Phone Call Additional follow up Details #1::        explained  to Adonia that we have no estimate of cost . the surgeons are the ones that would determine this cost. she wants to know the name of the surgery that he is going to need. . she wants to try to get him insurance and needs this information. will send message to MD to ask if he can give name of surgery . will then relay message to patient. Additional Follow-up by: Theresia Lo RN,  June 01, 2009 9:01 AM    Additional Follow-up for Phone Call Additional follow up Details #2::    No idea, again, this would be the decision on the surgeons. Follow-up by: Rodney Langton MD,  June 01, 2009 10:36 AM  Additional Follow-up for Phone Call Additional follow up Details #3:: Details for Additional Follow-up Action Taken:  above message left on voicemail. Additional Follow-up by: Theresia Lo RN,  June 01, 2009 11:26 AM

## 2010-04-05 NOTE — Miscellaneous (Signed)
Summary: Re: ENT referral  Clinical Lists Changes   received notification from [facility] that they are unable to complete ENT referral at this time due to lack of physicians in this speciality group. they will notify patient when they can process the referral. Theresia Lo RN  May 28, 2009 1:53 PM  Noted, thanks.  Is there any way we can try a neurosurgeon as the primary problem does involve vertebral spurs?  Let me know if i need to put in the referral. Rodney Langton MD  May 28, 2009 1:57 PM   yes put in referral. Theresia Lo RN  May 28, 2009 4:28 PM  Done, Rodney Langton MD  May 28, 2009 5:50 PM

## 2010-04-05 NOTE — Letter (Signed)
Summary: *Referral Letter ENT  [facility]  [address]   [place], [place] [address]   Phone: [phone]  Fax: [phone]    04/30/2009  Thank you in advance for agreeing to see my patient:  [name] [address] [place], [place]  [address]  Phone: [phone]  Reason for Referral: This is a 60 year old male with the below past medical history, long history of difficulty swallowing described as food getting stuck in upper esophagus.  He has been losing weight as well.  No palliation with PPI.  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  This prevented full epiglottal deflection/inversion.  Detailed report is available in E-chart under VIEWER>MBSS MD VIEW.  A scanned copy of the report will also be included with this referral letter.  Procedures Requested: Assistance in management of the above issue.  Current Medications: 1)  METOPROLOL TARTRATE 25 MG TABS (METOPROLOL TARTRATE) Two tabs by mouth two times a day 2)  NITROGLYCERIN 0.4 MG SUBL (NITROGLYCERIN) 3 tabs sublingual as needed for chest pain. 3)  MEGESTROL ACETATE 400 MG/10ML SUSP (MEGESTROL ACETATE) 10 mL by mouth daily dispense enough for one month 4)  ADULT ASPIRIN EC LOW STRENGTH 81 MG TBEC (ASPIRIN) 1 tab by mouth daily 5)  NICOTINE 21-14-7 MG/24HR KIT (NICOTINE) Use patches as directed, do not smoke when using patches 6)  ACETAMINOPHEN 650 MG CR-TABS (ACETAMINOPHEN) One tab by mouth q8h as needed for pain 7)  PRILOSEC 40 MG CPDR (OMEPRAZOLE) One tab by mouth qHS 8)  KLOR-CON M20 20 MEQ CR-TABS (POTASSIUM CHLORIDE CRYS CR) One tab by mouth daily   Past Medical History: 1)  Hypertension 2)  04/19/09: Repeat CT chest: previous nodule stable, but multiple new nodules.  Centrilobular emphysema.  Repeat CT chest in 6-12 months needed.    Thank you again  for agreeing to see our patient; please contact us if you have any further questions or need additional information.  Sincerely,  Rodney Langton MD

## 2010-04-05 NOTE — Letter (Signed)
Summary: [facility]  [facility]   Imported By: Clydell Hakim 08/19/2009 13:56:44  _____________________________________________________________________  External Attachment:    Type:   Image     Comment:   External Document  Appended Document: [facility]    Clinical Lists Changes  Observations: Added new observation of PAST MED HX: Hypertension  04/19/09: Repeat CT chest: previous nodule stable, but multiple new nodules.  Centrilobular emphysema.  Repeat CT chest in 6-12 months needed.   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 (08/19/2009 14:06)       Past History:  Past Medical History: Hypertension  04/19/09: Repeat CT chest: previous nodule stable, but multiple new nodules.  Centrilobular emphysema.  Repeat CT chest in 6-12 months needed.   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

## 2010-04-05 NOTE — Assessment & Plan Note (Signed)
Summary: losing more weight,tcb   Vital Signs:  Patient profile:   60 year old male Weight:      106.8 pounds Temp:     98.1 degrees F oral Pulse rate:   112 / minute BP sitting:   123 / 82  (left arm) Cuff size:   regular  Vitals Entered By: Gladstone Pih (Jul 05, 2009 3:23 PM) CC: C/O losing more wt Is Patient Diabetic? No Pain Assessment Patient in pain? no        Primary Care Provider:  Rodney Langton MD  CC:  C/O losing more wt.  History of Present Illness: 54M  ST: x 2.5 months, trouble swallowing, feels like food gets stuck in neck.  Some reflux symptoms.  Pain in throat radiates to right ear.  No wt loss, fevers, chills.  Swallow eval done, noted to have severe ant vertebral spurs that obstructed epiglottal movement. (see eval in echart).  No ENT in [place] would see him.  Pulm nodule:  repeat CT showed old nodules stable, several new nodules, recommended repeat CT chest in 6-12 mos.  HTN: Controlled  Habits & Providers  Alcohol-Tobacco-Diet     Tobacco Status: current     Tobacco Counseling: to quit use of tobacco products     Cigarette Packs/Day: <0.25  Current Medications (verified): 1)  Metoprolol Tartrate 50 Mg Tabs (Metoprolol Tartrate) .... One Tab By Mouth Bid 2)  Nitroglycerin 0.4 Mg Subl (Nitroglycerin) .... 3 Tabs Sublingual As Needed For Chest Pain. 3)  Megestrol Acetate 400 Mg/48ml Susp (Megestrol Acetate) .Marland Kitchen.. 10 Ml By Mouth Daily Dispense Enough For One Month 4)  Adult Aspirin Ec Low Strength 81 Mg Tbec (Aspirin) .Marland Kitchen.. 1 Tab By Mouth Daily 5)  Nicotine 21-14-7 Mg/24hr Kit (Nicotine) .... Use Patches As Directed, Do Not Smoke When Using Patches 6)  Acetaminophen 650 Mg Cr-Tabs (Acetaminophen) .... One Tab By Mouth Q8h As Needed For Pain 7)  Prilosec 40 Mg Cpdr (Omeprazole) .... One Tab By Mouth Qhs 8)  Klor-Con M20 20 Meq Cr-Tabs (Potassium Chloride Crys Cr) .... One Tab By Mouth Daily  Allergies (verified): No Known Drug  Allergies  Past History:  Past Medical History: Hypertension  04/19/09: Repeat CT chest: previous nodule stable, but multiple new nodules.  Centrilobular emphysema.  Repeat CT chest in 6-12 months needed.   Primary Anatomic Dysphagia  Social History: Packs/Day:  <0.25  Physical Exam  General:  Thin, WD/WH Lungs:  Normal respiratory effort, chest expands symmetrically. Lungs are clear to auscultation, no crackles or wheezes. Heart:  Normal rate and regular rhythm. S1 and S2 normal without gallop, murmur, click, rub or other extra sounds.   Impression & Recommendations:  Problem # 1:  DYSPHAGIA UNSPECIFIED (ICD-787.20) Assessment Unchanged [facility] E[facility] will see pt on May 20.  I want to see him after to discuss plans.  Neurosurg couldn't see pt until September.  Pt amenable to May 20 ENT visit.  Will send referral letter and MBSS reports.  Orders: ENT Referral (ENT)  Future Orders: Comp Met-[facility] [id number]) ... 07/06/2010 CBC-[facility] ([id number]) ... 07/06/2010 TSH-[facility] [id number]) ... 07/06/2010  Problem # 2:  WEIGHT LOSS (ICD-783.21) Assessment: Deteriorated Checking the below labs (pt to come back for appt).  Likely cause is the anatomic dysphagia noted on MBSS.  Orders: ENT Referral (ENT) [facility]- Est  Level 4 (99214)Future Orders: Comp Met-[facility] ([id number]) ... 07/06/2010 CBC-[facility] ([id number]) ... 07/06/2010 TSH-[facility] [id number]) ... 07/06/2010  Problem # 3:  PULMONARY NODULE (ICD-518.89) Assessment: Unchanged  Repeat  CT chest will be needed in 04/2010.  Orders: [facility]- Est  Level 4 (25956)  Problem # 4:  HYPERTENSION (ICD-401.9) Assessment: Improved Controlled on current meds.  His updated medication list for this problem includes:    Metoprolol Tartrate 50 Mg Tabs (Metoprolol tartrate) ..... One tab by mouth bid  Complete Medication List: 1)  Metoprolol Tartrate 50 Mg Tabs (Metoprolol tartrate) .... One tab by mouth bid 2)  Nitroglycerin 0.4 Mg Subl (Nitroglycerin) .... 3  tabs sublingual as needed for chest pain. 3)  Megestrol Acetate 400 Mg/90ml Susp (Megestrol acetate) .Marland Kitchen.. 10 ml by mouth daily dispense enough for one month 4)  Adult Aspirin Ec Low Strength 81 Mg Tbec (Aspirin) .Marland Kitchen.. 1 tab by mouth daily 5)  Nicotine 21-14-7 Mg/24hr Kit (Nicotine) .... Use patches as directed, do not smoke when using patches 6)  Acetaminophen 650 Mg Cr-tabs (Acetaminophen) .... One tab by mouth q8h as needed for pain 7)  Prilosec 40 Mg Cpdr (Omeprazole) .... One tab by mouth qhs 8)  Klor-con M20 20 Meq Cr-tabs (Potassium chloride crys cr) .... One tab by mouth daily  Patient Instructions: 1)  Great to see you today, 2)  Got ENT at [facility] to see you.  May 20th. 3)  Come back to see me IMMEDIATELY after your visit with them to discuss plans. 4)  Come back for a lab visit. 5)  -Dr. Karie Schwalbe.         CMP ordered

## 2010-04-05 NOTE — Assessment & Plan Note (Signed)
Summary: f/up,tcb   Vital Signs:  Patient profile:   60 year old male Weight:      116.3 pounds Temp:     98.6 degrees F oral Pulse rate:   97 / minute BP sitting:   162 / 106  (left arm)  Vitals Entered By: Loralee Pacas CMA (April 30, 2009 2:12 PM)  Primary Care Provider:  Rodney Langton MD   History of Present Illness: 42M  ST: x 1.5 months, trouble swallowing, feels like food gets stuck in neck.  Some reflux symptoms.  Pain in throat radiates to right ear.  No wt loss, fevers, chills.  Swallow eval done, noted to have severe ant vertebral spurs that obstructed epiglottal movement. (see eval in echart)  Pulm nodule:  repeat CT showed old nodules stable, several new nodules, recommended repeat CT chest in 6-12 mos.  HTN: elevated, took all meds.  Flu shot.  Current Medications (verified): 1)  Metoprolol Tartrate 25 Mg Tabs (Metoprolol Tartrate) .... Two Tabs By Mouth Two Times A Day 2)  Nitroglycerin 0.4 Mg Subl (Nitroglycerin) .... 3 Tabs Sublingual As Needed For Chest Pain. 3)  Megestrol Acetate 400 Mg/39ml Susp (Megestrol Acetate) .Marland Kitchen.. 10 Ml By Mouth Daily Dispense Enough For One Month 4)  Adult Aspirin Ec Low Strength 81 Mg Tbec (Aspirin) .Marland Kitchen.. 1 Tab By Mouth Daily 5)  Nicotine 21-14-7 Mg/24hr Kit (Nicotine) .... Use Patches As Directed, Do Not Smoke When Using Patches 6)  Acetaminophen 650 Mg Cr-Tabs (Acetaminophen) .... One Tab By Mouth Q8h As Needed For Pain 7)  Prilosec 40 Mg Cpdr (Omeprazole) .... One Tab By Mouth Qhs 8)  Klor-Con M20 20 Meq Cr-Tabs (Potassium Chloride Crys Cr) .... One Tab By Mouth Daily  Allergies (verified): No Known Drug Allergies  Past History:  Past Medical History: Hypertension  04/19/09: Repeat CT chest: previous nodule stable, but multiple new nodules.  Centrilobular emphysema.  Repeat CT chest in 6-12 months needed.   Review of Systems       See HPI  Physical Exam  General:  Well-developed,well-nourished,in no acute  distress; alert,appropriate and cooperative throughout examination Neck:  No deformities, masses, or tenderness noted. Lungs:  Normal respiratory effort, chest expands symmetrically. Lungs are clear to auscultation, no crackles or wheezes. Heart:  Normal rate and regular rhythm. S1 and S2 normal without gallop, murmur, click, rub or other extra sounds.   Impression & Recommendations:  Problem # 1:  DYSPHAGIA UNSPECIFIED (ICD-787.20) Assessment Unchanged ENT referral, see Swallow eval in echart, report will be scanned into his chart as well and sent to ENT.  Orders: ENT Referral (ENT) [facility]- Est  Level 4 (65784)  Problem # 2:  PULMONARY NODULE (ICD-518.89) Assessment: Unchanged Repeat CT chest in 6-12 months.  Orders: [facility]- Est  Level 4 (69629)  Problem # 3:  HYPERTENSION (ICD-401.9) Assessment: Deteriorated uncontrolled, question with a HR of 90 if he is truly beta blocked.  Will double metoprolol. Can change to 50mg  two times a day when he runs out  of 25mg .  His updated medication list for this problem includes:    Metoprolol Tartrate 25 Mg Tabs (Metoprolol tartrate) .Marland Kitchen..Marland Kitchen Two tabs by mouth two times a day  Orders: [facility]- Est  Level 4 (52841)  Complete Medication List: 1)  Metoprolol Tartrate 25 Mg Tabs (Metoprolol tartrate) .... Two tabs by mouth two times a day 2)  Nitroglycerin 0.4 Mg Subl (Nitroglycerin) .... 3 tabs sublingual as needed for chest pain. 3)  Megestrol Acetate 400 Mg/57ml Susp (  Megestrol acetate) .Marland Kitchen.. 10 ml by mouth daily dispense enough for one month 4)  Adult Aspirin Ec Low Strength 81 Mg Tbec (Aspirin) .Marland Kitchen.. 1 tab by mouth daily 5)  Nicotine 21-14-7 Mg/24hr Kit (Nicotine) .... Use patches as directed, do not smoke when using patches 6)  Acetaminophen 650 Mg Cr-tabs (Acetaminophen) .... One tab by mouth q8h as needed for pain 7)  Prilosec 40 Mg Cpdr (Omeprazole) .... One tab by mouth qhs 8)  Klor-con M20 20 Meq Cr-tabs (Potassium chloride crys cr) .... One tab  by mouth daily  Patient Instructions: 1)  ENT referral 2)  Double your metoprolol to two 25mg  tabs two times a day. I will refill with 50mg  tabs when you run out. 3)  Come back to see me after your ENT visit. 4)  -Dr. Karie Schwalbe.  Last Creatinine:  0.70 (04/16/2009 8:55:00 PM) Creatinine Next Due: 1 yr Last Potassium:  3.3 (04/16/2009 8:55:00 PM) Potassium Next Due:  1 yr

## 2010-04-08 NOTE — Consult Note (Signed)
Summary: MBSS Report  MBSS Report   Imported By: De Nurse 05/12/2009 11:40:17  _____________________________________________________________________  External Attachment:    Type:   Image     Comment:   External Document

## 2010-04-13 NOTE — Consult Note (Signed)
Summary: [facility] - Otol[facility]  [facility] - [facility]   Imported By: De Nurse 04/08/2010 14:20:34  _____________________________________________________________________  External Attachment:    Type:   Image     Comment:   External Document

## 2010-04-13 NOTE — Consult Note (Signed)
Summary: [facility] - Otolaryngology  [facility] - Otolaryngology   Imported By: De Nurse 04/05/2010 17:04:02  _____________________________________________________________________  External Attachment:    Type:   Image     Comment:   External Document

## 2010-04-18 ENCOUNTER — Other Ambulatory Visit (HOSPITAL_COMMUNITY): Payer: Self-pay

## 2010-04-28 ENCOUNTER — Inpatient Hospital Stay (HOSPITAL_COMMUNITY): Admission: RE | Admit: 2010-04-28 | Payer: Self-pay | Source: Ambulatory Visit

## 2010-05-12 ENCOUNTER — Ambulatory Visit: Payer: PRIVATE HEALTH INSURANCE | Admitting: Radiation Oncology

## 2010-05-13 ENCOUNTER — Ambulatory Visit: Payer: PRIVATE HEALTH INSURANCE | Admitting: Radiation Oncology

## 2010-05-19 ENCOUNTER — Encounter: Payer: PRIVATE HEALTH INSURANCE | Admitting: Oncology

## 2010-05-19 ENCOUNTER — Ambulatory Visit (HOSPITAL_COMMUNITY)
Admission: RE | Admit: 2010-05-19 | Discharge: 2010-05-19 | Disposition: A | Discharge status: Home / Self Care | Payer: PRIVATE HEALTH INSURANCE | Source: Ambulatory Visit | Attending: Radiation Oncology | Admitting: Radiation Oncology

## 2010-05-19 ENCOUNTER — Encounter (HOSPITAL_COMMUNITY): Payer: Self-pay

## 2010-05-19 DIAGNOSIS — C329 Malignant neoplasm of larynx, unspecified: Secondary | ICD-10-CM | POA: Insufficient documentation

## 2010-05-19 DIAGNOSIS — J438 Other emphysema: Secondary | ICD-10-CM | POA: Insufficient documentation

## 2010-05-19 HISTORY — DX: Essential (primary) hypertension: I10

## 2010-05-19 LAB — CREATININE, URINE, RANDOM: Creatinine, Urine: 138.7 mg/dL

## 2010-05-19 LAB — CBC
HCT: 37 % — ABNORMAL LOW (ref 39.0–52.0)
RDW: 17.3 % — ABNORMAL HIGH (ref 11.5–15.5)
WBC: 3.6 10*3/uL — ABNORMAL LOW (ref 4.0–10.5)

## 2010-05-19 LAB — DIFFERENTIAL
Eosinophils Relative: 1 % (ref 0–5)
Monocytes Relative: 8 % (ref 3–12)
Neutrophils Relative %: 37 % — ABNORMAL LOW (ref 43–77)

## 2010-05-19 LAB — BASIC METABOLIC PANEL
BUN: 12 mg/dL (ref 6–23)
Calcium: 8.1 mg/dL — ABNORMAL LOW (ref 8.4–10.5)
Creatinine, Ser: 0.79 mg/dL (ref 0.4–1.5)
GFR calc Af Amer: >60 mL/min (ref >60)
GFR calc non Af Amer: >60 mL/min (ref >60)

## 2010-05-19 LAB — GLUCOSE, CAPILLARY: Glucose-Capillary: 173 mg/dL — ABNORMAL HIGH (ref 70–99)

## 2010-05-19 LAB — SODIUM, URINE, RANDOM: Sodium, Ur: 44 mEq/L

## 2010-05-19 LAB — MAGNESIUM: Magnesium: 2 mg/dL (ref 1.5–2.5)

## 2010-05-19 MED ORDER — IOHEXOL 300 MG/ML  SOLN
80.0000 mL | Freq: Once | INTRAMUSCULAR | Status: AC | PRN
  Administered 2010-05-19: 80 mL via INTRAVENOUS

## 2010-05-26 ENCOUNTER — Ambulatory Visit: Payer: PRIVATE HEALTH INSURANCE | Attending: Radiation Oncology | Admitting: Radiation Oncology

## 2010-07-19 NOTE — H&P (Signed)
NAMEJAECE, [name] NO.:  [id number]   MEDICAL RECORD NO.:  [id number]          PATIENT TYPE:  OBV   LOCATION:  4735                         FACILITY:  [facility]   PHYSICIAN:  Nestor Ramp, MD        DATE OF BIRTH:  December 26, 1950   DATE OF ADMISSION:  10/29/2007  DATE OF DISCHARGE:                              HISTORY & PHYSICAL   TIME OF ADMISSION:  3:30 p.m.   PRIMARY CARE Elige Shouse:  None; this is an unassigned patient.   CHIEF COMPLAINT:  Chest pain and hypertension.   HISTORY OF PRESENTING ILLNESS:  This is a 60 year old male with no past  medical history that presents with chest pain that began on Friday,  October 25, 2007, after picking up a dehumidifier at work.  Gradually,  the pain increased to 6 to 7 out of 10 over the day, was located over  the left chest just below the nipple, and radiated to the left flank.  It was not associated with physical exertion, worsened by palpation, and  seems to move downward when the patient lays down.  The patient also  complains of associated headache, some nausea, and vomited x1 this  morning.  He states that what made him come to the ED today was just  that the pain had not gotten better.  He was found to have a blood  pressure, incidentally, of 217/120 in the ED, and the [facility]  teaching service was called.  The patient got labetalol 20 mg IV x1 and  a nitroglycerin drip in the ED, and blood pressure dropped to 180s/110s.  Chest pain was resolving when I saw him and it was 3/10, but the patient  did have some worsening headache from the nitroglycerin.  Denies any  shortness of breath, constipation, diarrhea, or limb pain or weakness.   PAST MEDICAL HISTORY:  None.   PAST SURGICAL HISTORY:  The patient had a right ankle ORIF many years  ago at [facility].   ALLERGIES:  The patient has no known drug allergies.   MEDICATIONS:  None.   FAMILY HISTORY:  Father died of cancer of unknown type.  Mother has  a  heart murmur and stroke.  Brother died of cancer.  Another brother died  of complications following esophageal surgery.  The patient has no  history of MI in the family.   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   PHYSICAL EXAMINATION:  VITAL SIGNS:  Blood pressure of 180/110, pulse of  81, respiratory rate of 18, temperature of 98.3 degrees Fahrenheit, and  oxygen saturation of 98% on 2 L nasal cannula.  GENERAL:  He is alert and oriented x3, in no acute distress, appears  gaunt.  HEENT:  Normocephalic and atraumatic with bitemporal wasting.  Poor  dentition.  Pupils were equal, round,  and reactive to light and  accommodation.  Extraocular muscles were intact.  Funduscopy was normal  and showed no vascular notching, no exudate, no hemorrhages, and no  papilledema bilaterally.  NECK:  Supple with no masses.  CARDIAC:  Regular rate and rhythm with no murmurs, rubs, or gallops.  There was no edema.  No carotid bruits and no jugular venous distention.  LUNGS:  Clear to auscultation bilaterally.  The left chest was tender to  palpation just below the left nipple.  ABDOMEN:  Soft, nontender, and nondistended with positive bowel sounds  and no masses palpated.  GENITALIA:  Normal for male.  EXTREMITIES:  The patient moved all 4.  There is no edema, and pulses  were 2+ x4.   LABORATORIES AT ADMISSION:  CBC:  White blood cell count of 6,  hemoglobin 14.7, hematocrit 43.5, and platelets 220.  BMP showed sodium  of 139, potassium 3.6, chloride 104, bicarb 29, BUN of 5, creatinine of  0.8, glucose of 80.  LFTs showed total protein 6.6, albumin 2.6, total  bilirubin 1.0, direct bilirubin 0.2, alkaline phosphatase 119, AST 28,  ALT 19, and lipase 11.  Cardiac  enzymes were negative x1; these were  point-of-care enzymes.  EKG showed normal sinus rhythm with some T-wave  inversions in V1 and V2 and no other ST changes.  CT head done in the ED  was normal, and chest x-ray done in the ED showed no acute process and  COPD.   ASSESSMENT AND PLAN:  We have a 60 year old male with chest pain.  1. Chest pain.  I will place the patient on observation in Telemetry      and draw cardiac enzymes x2.  This pain is unlikely cardiac, but I      must rule this out.  I will draw a fasting lipid panel in the      morning to risk stratify.  I will also get an EKG in the morning,      and this is likely musculoskeletal pain in etiology.  2. Hypertension.  There are no signs of end-organ damage.  The patient      will be put on labetalol 200 mg t.i.d. with parameters, and it is      possible that I may start hydrochlorothiazide prior to discharge.      I will also draw a urine drug screen to rule out stimulant as a      cause of his hypertension.  3. The patient's disposition will likely be tomorrow in the morning if      cardiac enzymes are negative.      Rodney Langton, MD  Electronically Signed      Nestor Ramp, MD  Electronically Signed    TT/MEDQ  D:  10/29/2007  T:  10/30/2007  Job:  [id number]

## 2010-07-19 NOTE — H&P (Signed)
NAMESANTIEL, [name]              ACCOUNT NO.:  [id number]   MEDICAL RECORD NO.:  [id number]          PATIENT TYPE:  OBV   LOCATION:  5524                         FACILITY:  [facility]   PHYSICIAN:  Santiago Bumpers. Hensel, M.D.DATE OF BIRTH:  04/14/1950   DATE OF ADMISSION:  12/30/2007  DATE OF DISCHARGE:                              HISTORY & PHYSICAL   PRIMARY CARE PHYSICIAN:  Enid Cutter, Family Practice.   CHIEF COMPLAINT:  Syncope.   HISTORY OF PRESENT ILLNESS:  This is a 60 year old male who was in the  kitchen cutting potatoes this afternoon.  He felt his vision go black  and blurry.  Next thing he knew people were around him and was  passed  out.  He did not have chest pain or shortness of breath.  He felt fine  up until that point.  He denies fevers, chills, body aches.  He has  never passed out before.  He is not on any new medications.  He ate  breakfast this morning.  He feels better now, although he still is a  little lightheaded.  Syncope was witnessed by the family.  There was no  report of shaking of extremities, though the family is not around to  confirm this.  He had loss of consciousness for approximately 30 seconds  per EMS report.  He denies heart palpitations.  He had a low-risk  nuclear imaging study in September.   PAST MEDICAL HISTORY:  Hypertension and weight loss.   SURGICAL HISTORY:  He had an ORIF of the right ankle many years ago.   MEDICATIONS:  He is on Lopressor HCT 100/25 one tab p.o. daily, Megace  400 mg per 10 mL p.o. daily, aspirin 81 mg p.o. daily, nitroglycerin  p.r.n.   FAMILY HISTORY:  He has a father who has lung cancer.  Brother died of  some kind of cancer unknown.  There is no history of coronary artery  disease in the family.   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  He lives  with his wife and 2 sons.  He smokes approximately one third of a pack a  day.  He drinks alcohol occasionally and smokes marijuana  occasionally.   ALLERGIES:  No known drug allergies.   REVIEW OF SYSTEMS:  As in HPI as well as complains of night sweats and  weight loss.  Complains of fainting and lightheadedness.  Complains of  falling down.  He denies sore throat, difficulty breathing, cough,  palpitations, shortness of breath, edema, abdominal pain, bloody stools,  change in bowel habits, diarrhea, dysuria, rash, seizures, anxiety,  bleeding.   PHYSICAL EXAMINATION:  VITAL SIGNS:  His O2 sat is 99% on room air, his  temperature is 98.7 with a T-max of 100.2, his heart rate is 82.  His  respiratory rate is 20.  His blood pressure orthostatic supine 110/76,  sitting 103/70, and standing 105/71, his heart rate ranged from 82, 99,  104.  GENERAL:  Not in acute distress, although he is thin appearing.  HEENT:  No abnormalities of the head.  Vision intact.  His pupils equal,  round, reactive to light and accommodation.  His extraocular muscles are  intact.  Ears showed no abnormalities.  Mouth shows no abnormalities.  Mouth shows dry mucous membranes and poor dentition.  No erythema of  throat.  NECK:  No deformities, masses, or tenderness.  LUNGS:  Clear to auscultation bilaterally.  No increased work of  breathing.  HEART:  Regular rate and rhythm.  No rubs, gallops, or murmurs.  ABDOMEN:  Soft, nontender, normal bowel sounds.  No distention and no  masses.  MUSCULOSKELETAL:  He has got normal range of movement.  There is some  joint tenderness.  No swelling.  No erythema.  PULSES:  He has got normal radial, DP, and posterior tibialis pulses.  EXTREMITIES:  No edema.  NEURO:  He is oriented x3.  Cranial nerves are grossly intact.  Reflexes  are equal and symmetric, and finger-to-nose was normal.  SKIN:  Without lesions or rashes.  LYMPH NODES:  No lymphadenopathy.   LABS:  He has point of care cardiac enzymes which are negative x2.  He  has a true set of cardiac enzyme which are negative x1.  A CT angiogram   of the chest showed no pulmonary embolism, however, it does show  pulmonary nodules bilaterally, the largest being approximately 14 mm.  CBC, his white count normal 9.6, hemoglobin 12.3, hematocrit 37,  platelets 260.  His CMP, sodium 132, potassium 3.3, chloride 94, bicarb  29, glucose 85, BUN 10, creatinine 0.74, calcium 8.6, albumin 2.6, AST  20, ALT 19, alk phos 96, and BNP is 36.  Urinalysis is negative except  for a small amount of esterase.  Repeat potassium 3.8 and repeat sodium  is 130.  Chest x-ray obtained shows no acute disease and a head CT was  negative.   ASSESSMENT:  This is a 60 year old male with syncope.  1. Syncope:  Differential diagnoses include vasovagal event, cardiac      etiology (arrhythmia, MI), orthostatic hypotension related to      medications.  Orthostatic blood pressures were normal in the      emergency room, however, the patient continued to have a low blood      pressure.  We are going to hold the Lopressor for now.  We will      rule out the patient for MI.  Place him on tele monitor and repeat      an EKG in the morning.  Monitor for cardiac events and arrhythmias.      Possibly this is vasovagal event without reasons.  He has had      normal nuclear stress test.  We will consider an outpatient Holter      monitor versus tilt table test in the future.  His head CT was      negative.  2. Hypertension:  The patient's blood pressure is fairly low for him,      although he is not orthostatic.  We will hold his Lopressor as this      still caused symptomatic hypotension.  Follow his blood pressure      overnight and continue his hydrochlorothiazide.  3. Weight loss:  Given the findings of pulmonary nodules on CT, cancer      is a strong possibility.  The patient can have workup in npatient      versus outpatient setting.  I will go ahead and check an HIV as      well.  4.  Pulmonary nodule:  This is a new finding on CT angio and is      concerned for  malignancy.  No treatment at this time.  CT cannot      rule out pneumonia.  Therefore, if the patient spikes a true      temperature greater than 101.5 we will treat for hospital-acquired      pneumonia as the patient was hospitalized within 90 days.      Currently, he is afebrile.  Therefore, I will not add on      antibiotics.  5. Hyponatremia.  This is concerning for an SIADH picture related to      malignancy.  He received fluids in the emergency room.  I will      saline lock him for now and follow with the sodium in the morning.  6. Hypokalemia.  He was repleted in the emergency room and this has      resolved.  We will follow up the BMET in the morning.  7. Prophylaxis.  We will start a PPI and early ambulation Lovenox      since no discharge in the morning.  8. Disposition:  No syncopal events, we can likely discharge him      tomorrow, however, I will discuss the workup for the pulmonary      nodules and whether this could be done inpatient given the      patient's financial difficulty.      Johney Maine, M.D.  Electronically Signed      Santiago Bumpers. Leveda Anna, M.D.  Electronically Signed    JT/MEDQ  D:  12/31/2007  T:  12/31/2007  Job:  [id number]

## 2010-07-19 NOTE — Discharge Summary (Signed)
[name], [name]              ACCOUNT NO.:  [id number]   MEDICAL RECORD NO.:  [id number]          PATIENT TYPE:  OBV   LOCATION:  5524                         FACILITY:  [facility]   PHYSICIAN:  [name], M.D.DATE OF BIRTH:  1950/09/12   DATE OF ADMISSION:  12/30/2007  DATE OF DISCHARGE:  12/31/2007                               DISCHARGE SUMMARY   DATE OF ADMISSION:  December 30, 2007.   DATE OF DISCHARGE:  December 31, 2007.   PRIMARY CARE [name]:  Nathaniel Langton, MD at [facility].   DISCHARGE DIAGNOSES:  1. Syncope.  2. Pulmonary nodules.   DISCHARGE MEDICATIONS:  1. Nitroglycerin 0.4 mg to take 3 tablets sublingual as needed for      chest pain.  2. Megestrol acetate 400 mg every 10 mL, take 10 mL by mouth daily.  3. Aspirin 81 mg take 1 tablet by mouth daily.   Discontinued medications, Lopressor HCL.   CONSULTS:  None.   PROCEDURES:  None.   STUDIES AND LABORATORY DATA:  Admission labs, troponin less than 0.05,  CK-MB less than 1.0, then troponin 0.01, CK-MB 0.4.  Basic metabolic  panel, sodium 130, potassium 3.8, hemoglobin 12.3, hematocrit 37, TSH  4.630, BNP 36.0.  Other pertinent labs troponin 0.01, CK-MB 0.5, CK 44,  sodium 134, and potassium 3.6.  Hemoglobin 13.3, hematocrit 39.0, white  blood cells 9.6, and platelets 260.   RADIOLOGY:  CT of the head without contrast on December 30, 2007.  Normal  CT of the head performed without contrast.  Chest x-ray on December 30, 2007, COPD with no active lung disease.  CT angiogram of the chest on  December 30, 2007.   IMPRESSION:  No evidence of acute pulmonary thromboembolism.  Bilateral  pulmonary nodules and/or focal opacities, largest of which is 14-mm of  the right lower lobe.  Followup chest CT in 3-6 months as recommended to  ensure resolution.  Early pneumonia of the right lung base is in the  differential.  EKG on December 31, 2007, a.m., normal sinus rhythm  without ST or  T-wave changes.   BRIEF HOSPITAL COURSE:  1. Syncope:  The patient initially presented to the hospital with      reported episode of syncope that occurred last night.  He was      reportedly was in the kitchen doing some work when he passed out.      This event was preceded by some blurry vision and feeling      lightheaded.  His wife states that he was on the ground for      approximately 2 or 3 minutes.  He denied any tongue biting or loss      of continence from this episode.  This was the first time an      episode such as this had occurred.  In the hospital, we worked him      up to evaluate for any cardiac etiology.  The patient had 2 EKGs,      which were normal as well as 2 sets of cardiac enzymes  that were      normal in this hospitalization.  The patient was also kept on      telemetry throughout the hospitalization, which showed no events or      arrhythmias.  He had no further symptoms of syncope,      lightheadedness, or blurry vision during this hospitalization.      This was felt to be likely due to hypotension, so his metoprolol      was held initially during the hospitalization.  Because of the risk      for further episodes of hypotension, we decided to discharge the      patient without any metoprolol or hydrochlorothiazide, with his      blood pressure to be followed up by his primary care [name] at      the next appointment.  2. Pulmonary nodules:  The patient was found to have several pulmonary      nodules bilaterally on chest CT on admission.  The largest of these      nodules was 14-mm in the right lower lobe.  This was especially      concerning given the patient's recent history of weight loss and      his cachectic appearance, though the patient does state that his      weight loss has only been about 10 pounds over the past several      months and he has always been skinny.  There is still some      suspicion for malignancy, however, we spoke with the  radiologist      directly to ask his suspicion and he stated that at this point it      is difficult to tell and the 14-mm nodule may actually be      atelectasis in the right lower lobe given its location.  He      recommended followup chest CT in 3 months to determine if these      nodules are changing or have disappeared by that time.  If still      present, we would consider further workup for infectious causes      versus malignancy.  The patient is also noted to have smoking      history, which is one reason why we are concerned for malignancy.      Given this patient's age, however, we are concerned about ruling      out other malignant processes that could be causing his weight loss      including prostate and colon cancer.  He has a recent PSA done in      clinic, which is normal.  We also recommend that the patient have a      followup colonoscopy in the outpatient setting.   DISCHARGE INSTRUCTIONS:  1. Diet:  Low sodium, heart healthy.  2. Wound care, not applicable.  3. Activity:  No restrictions, may return to work when ready.   FOLLOWUP APPOINTMENTS:  Please return to Dr. Benjamin Solomon on January 07, 2008, at 1:30 p.m. for follow up appointment.  The phone number for Dr.  Benjamin Solomon is [phone].   DISCHARGE CONDITION:  Improved and stable.   FOLLOWUP STUDIES:  The patient would benefit from a screening  colonoscopy in the outpatient setting.  Dr. Blossom Solomon practice  frequently is able to work with patients who do not have a way to pay  such as this one.  We will suggest calling him to schedule an outpatient  colonoscopy.  The patient will also benefit from a repeat chest CT scan  in 3 months to further evaluate the pulmonary nodules found on this  hospitalization.  Furthermore, we have stopped  this patient's blood pressure medications due to hypotension before  presentation to the hospital.  We will suggest close followup and  restarting these medications carefully  as the patient does have a recent  hospitalization for hypertensive crises.      Nathaniel Muir, MD  Electronically Signed      [name], M.D.  Electronically Signed    SO/MEDQ  D:  12/31/2007  T:  01/01/2008  Job:  [id number]   cc:   Nathaniel Langton, MD

## 2010-07-19 NOTE — Discharge Summary (Signed)
[name], [name] NO.:  [id number]   MEDICAL RECORD NO.:  [id number]          PATIENT TYPE:  OBV   LOCATION:  4735                         FACILITY:  [facility]   PHYSICIAN:  Nestor Ramp, MD        DATE OF BIRTH:  12/17/1950   DATE OF ADMISSION:  10/29/2007  DATE OF DISCHARGE:  10/30/2007                               DISCHARGE SUMMARY   PRIMARY CARE Andreu Drudge:  None.   REASON FOR ADMISSION:  Chest pain and hypertensive urgency.   BRIEF HISTORY:  This is a 60 year old male with chest pain that began  Friday, October 25, 2007, after picking the dehumidifier at work.  Gradually increased to 7/10 over the day, located over the left chest,  just below the nipple and radiates to the left flank and not associated  with physical exertion, worsened by palpation and seems to move when  the patient lies down.  Also, the patient complained of headache, some  nausea, vomiting x1 this morning.  He states what made him come to the  ED today was just that the pain had not gotten better.  Incidentally, he  was found to have a blood pressure of 217/120 in the ED and the family  practice teaching service was called.  The patient got labetalol 20 mg  IV x1 and a nitroglycerin drip and then the blood pressure dropped to  187/110s in the ED.   BRIEF HOSPITAL COURSE:  The patient was brought on to the floor to rule  out MI.  He did well on the floor.  There were no acute events and by  discharge, the chest pain and headache had completely resolved.   PERTINENT LABORATORIES:  The patient had a fasting lipid panel done.  Total cholesterol was 145, triglycerides 39, HDL 47, and LDL 90.  Cardiac enzymes were negative x2 and urine drug screen was negative  except for cannabinoids.  EKG showed normal sinus rhythm.  Also of note,  the ED did a CT of the head for the patient's headache, which came back  negative, and the patient's chest x-ray was also negative.  CBC and  complete metabolic  panel was also negative.   DISCHARGE CONDITION:  Stable and improved.   DISCHARGE DISPOSITION:  The patient will be discharged to home.   DISCHARGE MEDICATIONS:  The patient took no medications on admission and  will be discharged with Lopressor HCT 25/100 PO daily, nitroglycerin  0.4mg  SL prn chest pain, and aspirin 81 mg p.o. daily.   INSTRUCTIONS:  No physical exertion until outpatient stress test.   FOLLOWUP:  He will follow up with me Dr. Benjamin Stain at the [facility] on November 08, 2007, at 3:30 p.m.  He will  follow up at [facility] for an outpatient stress test, November 06, 2007, at 12'o clock noon.  The number there is [phone].      Rodney Langton, MD  Electronically Signed      Nestor Ramp, MD  Electronically Signed    TT/MEDQ  D:  10/30/2007  T:  10/30/2007  Job:  [id number]

## 2010-09-25 ENCOUNTER — Inpatient Hospital Stay (HOSPITAL_COMMUNITY)
Admission: EM | Admit: 2010-09-25 | Discharge: 2010-09-28 | DRG: 690 | Disposition: A | Discharge status: Home / Self Care | Payer: No Typology Code available for payment source | Attending: Family Medicine | Admitting: Family Medicine

## 2010-09-25 ENCOUNTER — Emergency Department (HOSPITAL_COMMUNITY): Payer: No Typology Code available for payment source

## 2010-09-25 ENCOUNTER — Encounter: Payer: Self-pay | Admitting: Family Medicine

## 2010-09-25 DIAGNOSIS — Z79899 Other long term (current) drug therapy: Secondary | ICD-10-CM

## 2010-09-25 DIAGNOSIS — I10 Essential (primary) hypertension: Secondary | ICD-10-CM

## 2010-09-25 DIAGNOSIS — E039 Hypothyroidism, unspecified: Secondary | ICD-10-CM | POA: Diagnosis present

## 2010-09-25 DIAGNOSIS — A498 Other bacterial infections of unspecified site: Secondary | ICD-10-CM | POA: Diagnosis present

## 2010-09-25 DIAGNOSIS — N390 Urinary tract infection, site not specified: Principal | ICD-10-CM | POA: Diagnosis present

## 2010-09-25 DIAGNOSIS — Z681 Body mass index (BMI) 19 or less, adult: Secondary | ICD-10-CM

## 2010-09-25 DIAGNOSIS — D638 Anemia in other chronic diseases classified elsewhere: Secondary | ICD-10-CM | POA: Diagnosis present

## 2010-09-25 DIAGNOSIS — F17200 Nicotine dependence, unspecified, uncomplicated: Secondary | ICD-10-CM | POA: Diagnosis present

## 2010-09-25 DIAGNOSIS — F1010 Alcohol abuse, uncomplicated: Secondary | ICD-10-CM | POA: Diagnosis present

## 2010-09-25 DIAGNOSIS — K769 Liver disease, unspecified: Secondary | ICD-10-CM | POA: Diagnosis present

## 2010-09-25 DIAGNOSIS — R627 Adult failure to thrive: Secondary | ICD-10-CM | POA: Diagnosis present

## 2010-09-25 DIAGNOSIS — R64 Cachexia: Secondary | ICD-10-CM | POA: Diagnosis present

## 2010-09-25 DIAGNOSIS — R188 Other ascites: Secondary | ICD-10-CM | POA: Diagnosis present

## 2010-09-25 DIAGNOSIS — E5111 Dry beriberi: Secondary | ICD-10-CM | POA: Diagnosis present

## 2010-09-25 DIAGNOSIS — A5900 Urogenital trichomoniasis, unspecified: Secondary | ICD-10-CM | POA: Diagnosis present

## 2010-09-25 DIAGNOSIS — Z8521 Personal history of malignant neoplasm of larynx: Secondary | ICD-10-CM

## 2010-09-25 LAB — URINE MICROSCOPIC-ADD ON

## 2010-09-25 LAB — DIFFERENTIAL
Basophils Relative: 1 % (ref 0–1)
Eosinophils Absolute: 0.2 10*3/uL (ref 0.0–0.7)
Eosinophils Relative: 4 % (ref 0–5)
Lymphs Abs: 0.7 10*3/uL (ref 0.7–4.0)
Monocytes Relative: 11 % (ref 3–12)

## 2010-09-25 LAB — CBC
MCH: 31.4 pg (ref 26.0–34.0)
MCV: 89.3 fL (ref 78.0–100.0)
Platelets: 128 10*3/uL — ABNORMAL LOW (ref 150–400)
RDW: 18.6 % — ABNORMAL HIGH (ref 11.5–15.5)
WBC: 4.3 10*3/uL (ref 4.0–10.5)

## 2010-09-25 LAB — CK TOTAL AND CKMB (NOT AT ARMC)
CK, MB: 1.8 ng/mL (ref 0.3–4.0)
Relative Index: INVALID (ref 0.0–2.5)
Total CK: 28 U/L (ref 7–232)

## 2010-09-25 LAB — URINALYSIS, ROUTINE W REFLEX MICROSCOPIC
Ketones, ur: 15 mg/dL — AB
Nitrite: POSITIVE — AB
Urobilinogen, UA: >8 mg/dL — ABNORMAL HIGH (ref 0.0–1.0)
pH: 6 (ref 5.0–8.0)

## 2010-09-25 LAB — COMPREHENSIVE METABOLIC PANEL
AST: 182 U/L — ABNORMAL HIGH (ref 0–37)
Albumin: 2 g/dL — ABNORMAL LOW (ref 3.5–5.2)
Calcium: 8.5 mg/dL (ref 8.4–10.5)
Creatinine, Ser: 0.61 mg/dL (ref 0.50–1.35)
Total Protein: 7.9 g/dL (ref 6.0–8.3)

## 2010-09-25 LAB — PROTIME-INR: Prothrombin Time: 19.4 seconds — ABNORMAL HIGH (ref 11.6–15.2)

## 2010-09-25 LAB — TROPONIN I: Troponin I: <0.3 ng/mL (ref <0.30)

## 2010-09-25 LAB — ETHANOL: Alcohol, Ethyl (B): 134 mg/dL — ABNORMAL HIGH (ref 0–11)

## 2010-09-25 MED ORDER — IOHEXOL 300 MG/ML  SOLN
80.0000 mL | Freq: Once | INTRAMUSCULAR | Status: AC | PRN
  Administered 2010-09-25: 80 mL via INTRAVENOUS

## 2010-09-25 NOTE — H&P (Signed)
Family Medicine Teaching [facility] Admission History and Physical  Patient name: [name] Medical record number: [id number] Date of birth: 1950-11-20 Age: 60 y.o. Gender: male  Primary Care Provider: DE Michel Bickers, MD  Chief Complaint: weakness, alcohol abuse  History of Present Illness: [name] is a 60 y.o. year old male presenting with generalized weakness and failure to thrive. Pt reports progressive weakness over the last 3-4 weeks. Weakness most prominent in lower extremities bilaterally. Pt denies any hemiparesis. Pt states that he came in to the ED secondary to acute worsening in pain over the weekend after drinking binge over the weekend. Pt cannot speak at baseline secondary to laryngeal cancer s/p throat surgery. Pt denies any CP, SOB, abd pain, nausea, vomiting, fevers, or chills. Pt's primary complaint is lower extremity weakness. Pt denies any numbness in LEs.   Patient Active Problem List  Diagnoses  . NEOPLASM, MALIGNANT, LARYNX  . TOBACCO USER  . ESSENTIAL HYPERTENSION, BENIGN  . HYPERTENSION  . PULMONARY NODULE   Past Medical History: Past Medical History  Diagnosis Date  . Hypertension     Past Surgical History: No past surgical history on file.  Social History: Pt currently lives with son. Drinks 1 quart of liquor weekly. Smokes 1 cigarette per day. Denies any other drug use   Allergies: vicodin  No current facility-administered medications for this visit.   Current Outpatient Prescriptions  Medication Sig Dispense Refill  . acetaminophen (TYLENOL) 650 MG CR tablet Take 650 mg by mouth every 8 (eight) hours as needed. For pain       . aspirin (ADULT ASPIRIN EC LOW STRENGTH) 81 MG EC tablet Take 81 mg by mouth daily.        . megestrol (MEGACE) 400 MG/10ML SUSP Take 400 mg by mouth daily. Dispense enough for one month       . metoprolol (LOPRESSOR) 50 MG tablet Take 50 mg by mouth 2 (two) times daily.        . Nicotine 21-14-7 MG/24HR KIT  Place onto the skin as directed. Use patches as directed, do not smoke when using patches       . nitroGLYCERIN (NITROSTAT) 0.4 MG SL tablet Place under the tongue as directed. 3 tablets sublingual as needed for chest pain       . omeprazole (PRILOSEC) 40 MG capsule Take 40 mg by mouth at bedtime.        . potassium chloride SA (KLOR-CON M20) 20 MEQ tablet Take 20 mEq by mouth daily.         Facility-Administered Medications Ordered in Other Visits  Medication Dose Route Frequency Provider Last Rate Last Dose  . iohexol (OMNIPAQUE) 300 MG/ML injection 80 mL  80 mL Intravenous Once PRN Medication Radiologist   80 mL at 09/25/10 1650   Review Of Systems: See HPI, otherwise 12 point ROS negative.       Otherwise 12 point review of systems was performed and was unremarkable.  Physical Exam: Pulse: 89-112  Blood Pressure:119-137/90-105 RR:14-19  O2:97% ion RA  Temp: 97.7 General: alert, cooperative and cachectic appearing HEENT: scleral icterus, + L eye ptosis, poor dentition, stoma in lower neck, post surgical changes in neck noted.  Heart: S1, S2 normal, no murmur, rub or gallop, regular rate and rhythm Lungs: clear to auscultation, no wheezes or rales and unlabored breathing Abdomen: abdomen is soft without significant tenderness, masses, organomegaly or guarding Extremities: extremities normal, atraumatic, no cyanosis or edema and extremities mildly cool to touch  Skin:faint jaundice diffusely Neurology: CN II-XII grossly intact, 3-4/5 strength in upper extremities, 2-3/5 strength in upper extremities bilaterally, no sensory deficits noted.   Labs and Imaging: Lab Results  Component Value Date/Time   NA 133* 09/25/2010  2:56 PM   K 3.8 09/25/2010  2:56 PM   CL 95* 09/25/2010  2:56 PM   CO2 25 09/25/2010  2:56 PM   BUN 6 09/25/2010  2:56 PM   CREATININE 0.61 09/25/2010  2:56 PM   GLUCOSE 86 09/25/2010  2:56 PM   Lab Results  Component Value Date   WBC 4.3 09/25/2010   HGB 9.4* 09/25/2010     HCT 26.7* 09/25/2010   MCV 89.3 09/25/2010   PLT 128* 09/25/2010  Lactate: 5.5 CT Abd:   1. Hepatomegaly with severe diffuse hepatic steatosis.  No focal   hepatic parenchymal masses.   2.  Moderately large amount of ascites throughout the abdomen and   pelvis.   3.  Bowel gas pattern consistent with moderate generalized ileus.   No evidence of bowel obstruction or free intraperitoneal air.   4.  Non-obstructing bilateral renal calculi.   5.  Small hiatal hernia.   6.  Cholelithiasis without CT evidence of acute cholecystitis. Head CT: cerebral atrophy, no acute findings.  CXR: No acute cardiopulmonary disease.    Assessment and Plan: [name] is a 60 y.o. year old male presenting with weakness and alcohol abuse.   Weakness: likely secondary to failure to thrive in setting of chronic ETOH abuse, poor nutrition, with question of contribution of malignancy. Will check prealbumin. Will consult nutrition. I anticipate that there may a contribution of B12/thiamine deficiency in this pt as a contribution to this pt's weakness as well. Will hydrate pt w/ D5 1/2NS. Wil l also check FOBT.  Elevated lactate: Likely secondary to ETOH abuse and liver disease. Will recheck in 8-12 hours s/p hydration. If unchanged, may consider further aggressive management including infectious work up. No noted WBC or fever, which is reassuring.  Hyponatremia: Likely secondary to ETOH abuse. Will hydrate and recheck.  HTN: Noted tachycardia. Question dehydration component. BPs overall stable. Will place on 1/2-1/4 dose of home metoprolol. Will also check TSH.  ETOH abuse: Likely major contribution to overall presentation. ETOH abuse counseling. CIWA protocol ETOH education. UDS.  Will continue to follow.  FEN/GI: NPO pending swallow eval. Respiratory for trach care. D5 1/2 NS @ 125/hr. Wil also check prealbumin to assess nutritional status.  Prophylaxis: Heparin subq Disposition: Pending further evaluation.

## 2010-09-26 LAB — BASIC METABOLIC PANEL
CO2: 27 mEq/L (ref 19–32)
Chloride: 96 mEq/L (ref 96–112)
Creatinine, Ser: 0.48 mg/dL — ABNORMAL LOW (ref 0.50–1.35)
Potassium: 3.3 mEq/L — ABNORMAL LOW (ref 3.5–5.1)
Sodium: 132 mEq/L — ABNORMAL LOW (ref 135–145)

## 2010-09-26 LAB — CBC
MCV: 89.1 fL (ref 78.0–100.0)
Platelets: 105 10*3/uL — ABNORMAL LOW (ref 150–400)
RBC: 2.38 MIL/uL — ABNORMAL LOW (ref 4.22–5.81)
WBC: 3.7 10*3/uL — ABNORMAL LOW (ref 4.0–10.5)

## 2010-09-26 LAB — FERRITIN: Ferritin: 3190 ng/mL — ABNORMAL HIGH (ref 22–322)

## 2010-09-26 LAB — URINE DRUGS OF ABUSE SCREEN W ALC, ROUTINE (REF LAB)
Barbiturate Quant, Ur: NEGATIVE
Benzodiazepines.: NEGATIVE
Cocaine Metabolites: NEGATIVE
Ethyl Alcohol: 64 mg/dL — ABNORMAL HIGH (ref <10)
Phencyclidine (PCP): NEGATIVE

## 2010-09-26 LAB — LACTIC ACID, PLASMA: Lactic Acid, Venous: 2.6 mmol/L — ABNORMAL HIGH (ref 0.5–2.2)

## 2010-09-26 LAB — MAGNESIUM: Magnesium: 1.5 mg/dL (ref 1.5–2.5)

## 2010-09-26 LAB — PHOSPHORUS: Phosphorus: 2.8 mg/dL (ref 2.3–4.6)

## 2010-09-26 LAB — IRON AND TIBC: Iron: 97 ug/dL (ref 42–135)

## 2010-09-26 LAB — PREALBUMIN: Prealbumin: 4 mg/dL — ABNORMAL LOW (ref 17.0–34.0)

## 2010-09-26 LAB — TSH: TSH: 18.57 u[IU]/mL — ABNORMAL HIGH (ref 0.350–4.500)

## 2010-09-26 LAB — HIV ANTIBODY (ROUTINE TESTING W REFLEX): HIV: NONREACTIVE

## 2010-09-26 LAB — GLUCOSE, CAPILLARY: Glucose-Capillary: 115 mg/dL — ABNORMAL HIGH (ref 70–99)

## 2010-09-27 LAB — URINE CULTURE
Colony Count: >=100000
Culture  Setup Time: 201207230121

## 2010-09-27 LAB — T3, FREE: T3, Free: 1.6 pg/mL — ABNORMAL LOW (ref 2.3–4.2)

## 2010-09-27 LAB — T4, FREE: Free T4: 0.69 ng/dL — ABNORMAL LOW (ref 0.80–1.80)

## 2010-09-28 LAB — BASIC METABOLIC PANEL
Chloride: 98 mEq/L (ref 96–112)
Creatinine, Ser: 0.69 mg/dL (ref 0.50–1.35)
GFR calc Af Amer: >60 mL/min (ref >60)
GFR calc Af Amer: >60 mL/min (ref >60)
GFR calc non Af Amer: >60 mL/min (ref >60)
Potassium: 3.7 mEq/L (ref 3.5–5.1)
Potassium: 3.6 mEq/L (ref 3.5–5.1)
Sodium: 128 mEq/L — ABNORMAL LOW (ref 135–145)
Sodium: 128 mEq/L — ABNORMAL LOW (ref 135–145)

## 2010-09-28 LAB — CBC
MCHC: 36.5 g/dL — ABNORMAL HIGH (ref 30.0–36.0)
Platelets: 102 10*3/uL — ABNORMAL LOW (ref 150–400)
RDW: 18.6 % — ABNORMAL HIGH (ref 11.5–15.5)

## 2010-10-02 LAB — CULTURE, BLOOD (ROUTINE X 2)
Culture  Setup Time: 201207230833
Culture: NO GROWTH

## 2010-10-03 LAB — ETHANOL CONFIRM, URINE: Ethanol, Ur - Confirmation: 0.06 GMS% — ABNORMAL HIGH (ref ?–0.04)

## 2010-10-11 ENCOUNTER — Ambulatory Visit (INDEPENDENT_AMBULATORY_CARE_PROVIDER_SITE_OTHER): Payer: No Typology Code available for payment source | Admitting: Family Medicine

## 2010-10-11 DIAGNOSIS — I10 Essential (primary) hypertension: Secondary | ICD-10-CM

## 2010-10-11 DIAGNOSIS — C329 Malignant neoplasm of larynx, unspecified: Secondary | ICD-10-CM

## 2010-10-11 DIAGNOSIS — E039 Hypothyroidism, unspecified: Secondary | ICD-10-CM

## 2010-10-11 NOTE — Discharge Summary (Signed)
NAMEDUWAYNE, MATTERS NO.:  [id number]  MEDICAL RECORD NO.:  [id number]  LOCATION:  [facility]                         FACILITY:  [facility]  PHYSICIAN:  Nathaniel Bumpers. Sevannah Madia, M.D.DATE OF BIRTH:  1950/09/02  DATE OF ADMISSION:  09/25/2010 DATE OF DISCHARGE:  09/28/2010                              DISCHARGE SUMMARY   DISCHARGE DIAGNOSES: 1. Urinary tract infection. 2. Hypothyroidism. 3. Neoplasm of larynx, status post resection. 4. Failure to thrive. 5. Hypertension. 6. Alcohol and tobacco abuser. 7. Anemia of chronic disease.  DISCHARGE MEDICATIONS: 1. Ciprofloxacin 250 mg tablet, take 1 tablet p.o. b.i.d. for 9 days. 2. Ensure 237 mL p.o. t.i.d. 3. Folic acid 1 mg p.o. daily. 4. Levothyroxine 25 mcg take half p.o. before breakfast daily. 5. Megace 40 mg/mL take 400 mg p.o. daily. 6. Multivitamins 1 capsule p.o. daily. 7. Thiamine 100 mg take 1 tablet p.o. daily. 8. Metoprolol 25 mg 1 tablet p.o. b.i.d.  CONSULTS:  Nutrition.  LABORATORY DATA:  Ammonia 21, prealbumin 4.0, magnesium 1.5, phosphorus 2.8, anemia panel with item 97, UIBC less than 55, folate 4.1, ferritin 3190.  HIV and RPR nonreactive.  Urinalysis micro with bacteria and trichomonas and urine culture positive for E coli sensitive to Cipro. Urine drug positive for alcohol.  TSH 18.4, free T3 of 1.63, T4 of 0.69, and blood cultures negative.  PERTINENT STUDIES: 1. Chest x-ray in September 25, 2010, with no acute cardiopulmonary     disease. 2. CT of the head without contrast in September 25, 2010, with no acute     intracranial abnormality and mild cerebral atrophy. 3. CT of abdomen with contrast that showed hepatomegaly with severely     diffuse steatosis.  No focal masses.  Moderate ascites throughout     abdomen and pelvis.     Cholelithiasis without cholecystitis.     Nonobstructive bilateral renal calculi.  With moderate generalized     ileus with no bowel obstruction.  BRIEF HOSPITAL COURSE:  This  is a 60 year old male with the history of alcohol and tobacco abuse and larynx cancer, status post resection that comes with lower extremity weakness. 1. Failure to thrive.  The patient looks cachectic.  Evaluated by     nutrition that recommends Ensure t.i.d.  Also, multivitamin     supplement.  The patient is initiated on CIWA protocol due his     alcohol abuse so thiamine is given. Lower extremity weakness improved     after few days of treatment.  2. UTI.  Urine positive for trichomonas and cultures positive for E.     coli.  On treatment here 1 dose of 2 g of metronidazole and then     continue with ciprofloxacin for a total of 14 days.  Other STDs     tests were negative. 3. TSH.  High in free T3 and T4 low, so diagnosis of hypothyroidism     was established and was started with low dose of levothyroxine due     to the patient's general status and sinus tachycardia present.  We     will recommend checking TSH as an outpatient and adjusting dose as     needed. 4.  Tobacco and alcohol abuse.  The patient had evaluation for social     worker that states the patient is at low risk and recommend     reinforce abstinence. Pt urine was positive for alcohol and signs      of thiamine deficency present make [place] think that he will probably benefit from     more detail evaluation about alcohol intake to better aproach future management/     counseling      The patient was evaluated and counseled about tobacco cesation. His other medical conditions were stable at this hospitalization.  DISCHARGE INSTRUCTIONS: 1. Activity.  Return to daily activities. 2. Heart-healthy diet. 3. Appointment with Dr. Domenick Bookbinder on October 11, 2010, at 3:30 p.m. 4. The patient is discharged home in stable medical condition.    ______________________________ Nathaniel Both, MD   ______________________________ [name], M.D.    DP/MEDQ  D:  10/02/2010  T:  10/03/2010  Job:   [id number]  Electronically Signed by Delorse Lek PAZ  on 10/06/2010 11:33:44 PM Electronically Signed by Doralee Albino M.D. on 10/11/2010 10:05:00 AM

## 2010-10-11 NOTE — Patient Instructions (Addendum)
It was great to meet you today. Please contact ENT physician at [facility] for follow up appointment. Please cut back on smoking and alcohol intake. Continue to eat foods high in calories and supplement with Ensure three times a day with meals. If you develop fever, decreased energy, weakness, inability to stand/walk, please call MD or return to clinic. Return to clinic in 4-6 weeks.  I will recheck TSH at that time.

## 2010-10-16 ENCOUNTER — Encounter: Payer: Self-pay | Admitting: Family Medicine

## 2010-10-16 DIAGNOSIS — E039 Hypothyroidism, unspecified: Secondary | ICD-10-CM | POA: Insufficient documentation

## 2010-10-16 NOTE — Assessment & Plan Note (Addendum)
Last TSH was 18.4 during hospitalization. Continue with Synthroid 12.5 mg daily. Return to clinic in 4-6 weeks to repeat TSH and adjust dose.

## 2010-10-16 NOTE — Assessment & Plan Note (Signed)
Advised patient to follow up with ENT physician at [facility].   Spent 7-10 minutes discussing the importance of smoking cessation. Advised patient to continue with Megace and Ensure supplements TID with meals. Will follow up ENT recommendations.

## 2010-10-16 NOTE — Progress Notes (Signed)
  Subjective:    [name], male    DOB: 11-30-1950, 60 y.o.   MRN: [id number]  HPI  Patient here for hospital follow up: diagnosed with UTI and failure to thrive.  Patient has hx laryngeal cancer s/p radiation - he is non-verbal. Currently taking Cipro x 14 days.  Per son, patient is doing well.  His appetite has increased and is slowly gaining weight with Megace and Ensure.   Patient continues to smoke (1-2 cig/day) and drink alcohol (2-3 drinks on weekends).  Patient and son cannot remember name of ENT physician at [facility].  They do not know if patient was to follow up with him/her after radiation.    Patient denies any fever, chills, night sweats, nausea/vomiting, chest pain, abdominal pain.  Denies dysuria, burning with urination, decreased UOP.  Still endorses weakness.  Review of Systems  Per HPI    Objective:   Physical Exam  Constitutional: Vital signs are normal. He appears cachectic.       Non-verbal s/p laryngeal cancer, history provided by son  HENT:  Head: Normocephalic and atraumatic.  Eyes: EOM are normal. Pupils are equal, round, and reactive to light.  Cardiovascular: Normal rate and regular rhythm.  Exam reveals no gallop and no friction rub.   No murmur heard. Pulmonary/Chest: Effort normal and breath sounds normal. No respiratory distress. He has no wheezes. He has no rales. He exhibits no tenderness.  Abdominal: Bowel sounds are normal. There is no tenderness.       Moderately distended secondary to ascites  Musculoskeletal: Normal range of motion. He exhibits no edema.  Neurological: He is alert. No cranial nerve deficit.  Skin: No rash noted.  Psychiatric: He has a normal mood and affect. Thought content normal. He is slowed and withdrawn. Cognition and memory are normal. He is noncommunicative.          Assessment & Plan:

## 2010-10-16 NOTE — Assessment & Plan Note (Signed)
BP currently controlled on Lopressor 50 BID. Continue current regimen.  Follow up in  4-6 weeks.

## 2010-11-13 ENCOUNTER — Inpatient Hospital Stay (HOSPITAL_COMMUNITY)
Admission: EM | Admit: 2010-11-13 | Discharge: 2010-11-22 | DRG: 187 | Disposition: A | Discharge status: Home / Self Care | Payer: Medicaid Other | Attending: Internal Medicine | Admitting: Internal Medicine

## 2010-11-13 DIAGNOSIS — Z9089 Acquired absence of other organs: Secondary | ICD-10-CM

## 2010-11-13 DIAGNOSIS — F1020 Alcohol dependence, uncomplicated: Secondary | ICD-10-CM | POA: Diagnosis present

## 2010-11-13 DIAGNOSIS — IMO0002 Reserved for concepts with insufficient information to code with codable children: Secondary | ICD-10-CM | POA: Diagnosis present

## 2010-11-13 DIAGNOSIS — I252 Old myocardial infarction: Secondary | ICD-10-CM

## 2010-11-13 DIAGNOSIS — I10 Essential (primary) hypertension: Secondary | ICD-10-CM | POA: Diagnosis present

## 2010-11-13 DIAGNOSIS — Z87828 Personal history of other (healed) physical injury and trauma: Secondary | ICD-10-CM

## 2010-11-13 DIAGNOSIS — J90 Pleural effusion, not elsewhere classified: Principal | ICD-10-CM | POA: Diagnosis present

## 2010-11-13 DIAGNOSIS — F17200 Nicotine dependence, unspecified, uncomplicated: Secondary | ICD-10-CM | POA: Diagnosis present

## 2010-11-13 DIAGNOSIS — Z930 Tracheostomy status: Secondary | ICD-10-CM

## 2010-11-13 DIAGNOSIS — K5900 Constipation, unspecified: Secondary | ICD-10-CM | POA: Diagnosis present

## 2010-11-13 DIAGNOSIS — N390 Urinary tract infection, site not specified: Secondary | ICD-10-CM | POA: Diagnosis present

## 2010-11-13 DIAGNOSIS — K7030 Alcoholic cirrhosis of liver without ascites: Secondary | ICD-10-CM | POA: Diagnosis present

## 2010-11-13 DIAGNOSIS — E039 Hypothyroidism, unspecified: Secondary | ICD-10-CM | POA: Diagnosis present

## 2010-11-13 DIAGNOSIS — E860 Dehydration: Secondary | ICD-10-CM | POA: Diagnosis present

## 2010-11-13 DIAGNOSIS — R188 Other ascites: Secondary | ICD-10-CM | POA: Diagnosis present

## 2010-11-13 DIAGNOSIS — Z8521 Personal history of malignant neoplasm of larynx: Secondary | ICD-10-CM

## 2010-11-13 DIAGNOSIS — Z79899 Other long term (current) drug therapy: Secondary | ICD-10-CM

## 2010-11-13 DIAGNOSIS — T17908A Unspecified foreign body in respiratory tract, part unspecified causing other injury, initial encounter: Secondary | ICD-10-CM | POA: Diagnosis present

## 2010-11-13 DIAGNOSIS — R911 Solitary pulmonary nodule: Secondary | ICD-10-CM | POA: Diagnosis present

## 2010-11-13 DIAGNOSIS — Y92009 Unspecified place in unspecified non-institutional (private) residence as the place of occurrence of the external cause: Secondary | ICD-10-CM

## 2010-11-13 DIAGNOSIS — A498 Other bacterial infections of unspecified site: Secondary | ICD-10-CM | POA: Diagnosis present

## 2010-11-13 DIAGNOSIS — Y998 Other external cause status: Secondary | ICD-10-CM

## 2010-11-13 DIAGNOSIS — I959 Hypotension, unspecified: Secondary | ICD-10-CM | POA: Diagnosis not present

## 2010-11-14 ENCOUNTER — Emergency Department: Admission: EM | Admit: 2010-11-14 | Payer: Self-pay | Source: Ambulatory Visit | Admitting: Family Medicine

## 2010-11-14 ENCOUNTER — Encounter (HOSPITAL_COMMUNITY): Payer: Self-pay

## 2010-11-14 ENCOUNTER — Emergency Department (HOSPITAL_COMMUNITY): Payer: Medicaid Other

## 2010-11-14 LAB — COMPREHENSIVE METABOLIC PANEL
ALT: 20 U/L (ref 0–53)
Alkaline Phosphatase: 197 U/L — ABNORMAL HIGH (ref 39–117)
CO2: 21 mEq/L (ref 19–32)
Chloride: 100 mEq/L (ref 96–112)
GFR calc Af Amer: >60 mL/min (ref >60)
GFR calc non Af Amer: >60 mL/min (ref >60)
Glucose, Bld: 93 mg/dL (ref 70–99)
Potassium: 3.6 mEq/L (ref 3.5–5.1)
Sodium: 136 mEq/L (ref 135–145)

## 2010-11-14 LAB — CBC
MCH: 30.7 pg (ref 26.0–34.0)
Platelets: 188 10*3/uL (ref 150–400)
RBC: 2.93 MIL/uL — ABNORMAL LOW (ref 4.22–5.81)

## 2010-11-14 LAB — URINALYSIS, ROUTINE W REFLEX MICROSCOPIC
Nitrite: NEGATIVE
Protein, ur: 30 mg/dL — AB
pH: 7 (ref 5.0–8.0)

## 2010-11-14 LAB — DIFFERENTIAL
Basophils Relative: 0 % (ref 0–1)
Eosinophils Absolute: 0 10*3/uL (ref 0.0–0.7)
Monocytes Relative: 8 % (ref 3–12)
Neutrophils Relative %: 79 % — ABNORMAL HIGH (ref 43–77)

## 2010-11-14 LAB — LACTIC ACID, PLASMA
Lactic Acid, Venous: 7.5 mmol/L — ABNORMAL HIGH (ref 0.5–2.2)
Lactic Acid, Venous: 5.8 mmol/L — ABNORMAL HIGH (ref 0.5–2.2)

## 2010-11-14 LAB — PRO B NATRIURETIC PEPTIDE: Pro B Natriuretic peptide (BNP): 519.7 pg/mL — ABNORMAL HIGH (ref 0–125)

## 2010-11-14 LAB — POCT I-STAT TROPONIN I

## 2010-11-14 MED ORDER — IOHEXOL 300 MG/ML  SOLN
100.0000 mL | Freq: Once | INTRAMUSCULAR | Status: AC | PRN
  Administered 2010-11-14: 100 mL via INTRAVENOUS

## 2010-11-15 ENCOUNTER — Other Ambulatory Visit: Payer: Self-pay | Admitting: Interventional Radiology

## 2010-11-15 ENCOUNTER — Inpatient Hospital Stay (HOSPITAL_COMMUNITY): Payer: Medicaid Other

## 2010-11-15 LAB — T3, FREE: T3, Free: 1.4 pg/mL — ABNORMAL LOW (ref 2.3–4.2)

## 2010-11-15 LAB — CBC
HCT: 26.3 % — ABNORMAL LOW (ref 39.0–52.0)
Hemoglobin: 9.1 g/dL — ABNORMAL LOW (ref 13.0–17.0)
MCH: 31.5 pg (ref 26.0–34.0)
MCV: 91 fL (ref 78.0–100.0)
Platelets: 180 10*3/uL (ref 150–400)
RBC: 2.89 MIL/uL — ABNORMAL LOW (ref 4.22–5.81)
WBC: 7.7 10*3/uL (ref 4.0–10.5)

## 2010-11-15 LAB — COMPREHENSIVE METABOLIC PANEL
Albumin: 1.5 g/dL — ABNORMAL LOW (ref 3.5–5.2)
Alkaline Phosphatase: 179 U/L — ABNORMAL HIGH (ref 39–117)
BUN: 6 mg/dL (ref 6–23)
CO2: 22 mEq/L (ref 19–32)
Chloride: 101 mEq/L (ref 96–112)
Creatinine, Ser: 0.59 mg/dL (ref 0.50–1.35)
GFR calc Af Amer: >60 mL/min (ref >60)
GFR calc non Af Amer: >60 mL/min (ref >60)
Glucose, Bld: 86 mg/dL (ref 70–99)
Potassium: 3.5 mEq/L (ref 3.5–5.1)
Total Bilirubin: 1.3 mg/dL — ABNORMAL HIGH (ref 0.3–1.2)

## 2010-11-15 LAB — DIFFERENTIAL
Eosinophils Absolute: 0 10*3/uL (ref 0.0–0.7)
Lymphocytes Relative: 13 % (ref 12–46)
Lymphs Abs: 1 10*3/uL (ref 0.7–4.0)
Monocytes Relative: 10 % (ref 3–12)
Neutrophils Relative %: 76 % (ref 43–77)

## 2010-11-15 LAB — TSH: TSH: 27.001 u[IU]/mL — ABNORMAL HIGH (ref 0.350–4.500)

## 2010-11-15 LAB — BODY FLUID CELL COUNT WITH DIFFERENTIAL: Lymphs, Fluid: 5 %

## 2010-11-15 LAB — PROTEIN, BODY FLUID
Total protein, fluid: 1.2 g/dL
Total protein, fluid: 1.2 g/dL

## 2010-11-15 LAB — LACTATE DEHYDROGENASE, PLEURAL OR PERITONEAL FLUID: LD, Fluid: 66 U/L — ABNORMAL HIGH (ref 3–23)

## 2010-11-15 LAB — T4, FREE: Free T4: 0.5 ng/dL — ABNORMAL LOW (ref 0.80–1.80)

## 2010-11-15 MED ORDER — IOHEXOL 300 MG/ML  SOLN: 50.0000 mL | Freq: Once | INTRAMUSCULAR | Status: AC | PRN

## 2010-11-16 LAB — PATHOLOGIST SMEAR REVIEW

## 2010-11-17 ENCOUNTER — Other Ambulatory Visit (HOSPITAL_COMMUNITY): Payer: No Typology Code available for payment source

## 2010-11-17 ENCOUNTER — Inpatient Hospital Stay (HOSPITAL_COMMUNITY): Payer: Medicaid Other

## 2010-11-17 LAB — BASIC METABOLIC PANEL
BUN: 8 mg/dL (ref 6–23)
CO2: 23 mEq/L (ref 19–32)
Calcium: 6.9 mg/dL — ABNORMAL LOW (ref 8.4–10.5)
Creatinine, Ser: 0.73 mg/dL (ref 0.50–1.35)
GFR calc non Af Amer: >60 mL/min (ref >60)
Glucose, Bld: 83 mg/dL (ref 70–99)
Sodium: 131 mEq/L — ABNORMAL LOW (ref 135–145)

## 2010-11-17 LAB — CBC
HCT: 25.7 % — ABNORMAL LOW (ref 39.0–52.0)
Hemoglobin: 9.2 g/dL — ABNORMAL LOW (ref 13.0–17.0)
MCH: 31.8 pg (ref 26.0–34.0)
MCHC: 35.8 g/dL (ref 30.0–36.0)
MCV: 88.9 fL (ref 78.0–100.0)
RBC: 2.89 MIL/uL — ABNORMAL LOW (ref 4.22–5.81)

## 2010-11-18 ENCOUNTER — Other Ambulatory Visit (HOSPITAL_COMMUNITY): Payer: No Typology Code available for payment source

## 2010-11-18 LAB — BASIC METABOLIC PANEL
CO2: 22 mEq/L (ref 19–32)
Chloride: 102 mEq/L (ref 96–112)
GFR calc Af Amer: >60 mL/min (ref >60)
Potassium: 3.7 mEq/L (ref 3.5–5.1)
Sodium: 131 mEq/L — ABNORMAL LOW (ref 135–145)

## 2010-11-18 LAB — MAGNESIUM: Magnesium: 1.5 mg/dL (ref 1.5–2.5)

## 2010-11-19 ENCOUNTER — Inpatient Hospital Stay (HOSPITAL_COMMUNITY): Payer: Medicaid Other

## 2010-11-19 LAB — BASIC METABOLIC PANEL
BUN: 9 mg/dL (ref 6–23)
Chloride: 103 mEq/L (ref 96–112)
GFR calc Af Amer: >60 mL/min (ref >60)
Glucose, Bld: 85 mg/dL (ref 70–99)
Potassium: 3.7 mEq/L (ref 3.5–5.1)
Sodium: 131 mEq/L — ABNORMAL LOW (ref 135–145)

## 2010-11-19 LAB — BODY FLUID CELL COUNT WITH DIFFERENTIAL
Eos, Fluid: 3 %
Neutrophil Count, Fluid: 22 % (ref 0–25)
Total Nucleated Cell Count, Fluid: 389 cu mm (ref 0–1000)

## 2010-11-19 LAB — BODY FLUID CULTURE: Culture: NO GROWTH

## 2010-11-19 LAB — CBC
HCT: 24.3 % — ABNORMAL LOW (ref 39.0–52.0)
Hemoglobin: 8.4 g/dL — ABNORMAL LOW (ref 13.0–17.0)
RBC: 2.71 MIL/uL — ABNORMAL LOW (ref 4.22–5.81)
WBC: 6 10*3/uL (ref 4.0–10.5)

## 2010-11-19 LAB — LACTATE DEHYDROGENASE, PLEURAL OR PERITONEAL FLUID

## 2010-11-19 LAB — PROTEIN, BODY FLUID

## 2010-11-20 LAB — BASIC METABOLIC PANEL
BUN: 7 mg/dL (ref 6–23)
CO2: 19 mEq/L (ref 19–32)
Calcium: 7 mg/dL — ABNORMAL LOW (ref 8.4–10.5)
Creatinine, Ser: 0.66 mg/dL (ref 0.50–1.35)
GFR calc non Af Amer: >60 mL/min (ref >60)
Glucose, Bld: 86 mg/dL (ref 70–99)
Sodium: 127 mEq/L — ABNORMAL LOW (ref 135–145)

## 2010-11-20 LAB — CBC
Hemoglobin: 8.2 g/dL — ABNORMAL LOW (ref 13.0–17.0)
MCH: 30.5 pg (ref 26.0–34.0)
MCHC: 34.6 g/dL (ref 30.0–36.0)
MCV: 88.1 fL (ref 78.0–100.0)
RBC: 2.69 MIL/uL — ABNORMAL LOW (ref 4.22–5.81)

## 2010-11-20 LAB — CULTURE, BLOOD (ROUTINE X 2)

## 2010-11-21 ENCOUNTER — Other Ambulatory Visit: Payer: Self-pay | Admitting: Interventional Radiology

## 2010-11-21 LAB — URINE CULTURE

## 2010-11-22 LAB — DIFFERENTIAL
Basophils Relative: 1 % (ref 0–1)
Eosinophils Absolute: 0.2 10*3/uL (ref 0.0–0.7)
Eosinophils Relative: 4 % (ref 0–5)
Lymphs Abs: 1.2 10*3/uL (ref 0.7–4.0)
Monocytes Absolute: 0.8 10*3/uL (ref 0.1–1.0)
Monocytes Relative: 16 % — ABNORMAL HIGH (ref 3–12)
Neutrophils Relative %: 53 % (ref 43–77)

## 2010-11-22 LAB — CBC
MCH: 30.5 pg (ref 26.0–34.0)
MCHC: 34.5 g/dL (ref 30.0–36.0)
MCV: 88.3 fL (ref 78.0–100.0)
Platelets: 120 10*3/uL — ABNORMAL LOW (ref 150–400)
RBC: 2.56 MIL/uL — ABNORMAL LOW (ref 4.22–5.81)
RDW: 18 % — ABNORMAL HIGH (ref 11.5–15.5)

## 2010-11-22 LAB — BASIC METABOLIC PANEL
CO2: 21 mEq/L (ref 19–32)
Calcium: 7.1 mg/dL — ABNORMAL LOW (ref 8.4–10.5)
Creatinine, Ser: 0.76 mg/dL (ref 0.50–1.35)
GFR calc non Af Amer: >60 mL/min (ref >60)
Sodium: 126 mEq/L — ABNORMAL LOW (ref 135–145)

## 2010-11-22 LAB — BODY FLUID CULTURE
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
## 2010-11-23 ENCOUNTER — Emergency Department (HOSPITAL_COMMUNITY)
Admission: EM | Admit: 2010-11-23 | Discharge: 2010-11-24 | Disposition: A | Discharge status: Home / Self Care | Payer: Medicaid Other | Attending: Emergency Medicine | Admitting: Emergency Medicine

## 2010-11-23 ENCOUNTER — Emergency Department (HOSPITAL_COMMUNITY): Payer: Medicaid Other

## 2010-11-23 DIAGNOSIS — I10 Essential (primary) hypertension: Secondary | ICD-10-CM | POA: Insufficient documentation

## 2010-11-23 DIAGNOSIS — R0602 Shortness of breath: Secondary | ICD-10-CM | POA: Insufficient documentation

## 2010-11-23 DIAGNOSIS — Z930 Tracheostomy status: Secondary | ICD-10-CM | POA: Insufficient documentation

## 2010-11-23 DIAGNOSIS — J4489 Other specified chronic obstructive pulmonary disease: Secondary | ICD-10-CM | POA: Insufficient documentation

## 2010-11-23 DIAGNOSIS — Z9981 Dependence on supplemental oxygen: Secondary | ICD-10-CM | POA: Insufficient documentation

## 2010-11-23 DIAGNOSIS — J449 Chronic obstructive pulmonary disease, unspecified: Secondary | ICD-10-CM | POA: Insufficient documentation

## 2010-11-24 ENCOUNTER — Telehealth (INDEPENDENT_AMBULATORY_CARE_PROVIDER_SITE_OTHER): Payer: No Typology Code available for payment source | Admitting: *Deleted

## 2010-11-24 DIAGNOSIS — Z8521 Personal history of malignant neoplasm of larynx: Secondary | ICD-10-CM

## 2010-11-24 NOTE — Telephone Encounter (Signed)
Called [facility] to clarify orders.  Requested Rx for trach suction machine and supplies.  I faxed Rx to [facility] today.

## 2010-11-24 NOTE — Telephone Encounter (Signed)
Received call from [facility] stating they have set patient up with oxygen but they need an order . Also needs order for trach suction machine  and suction catheters. Please fax to [phone]. Will forward to MD.

## 2010-11-24 NOTE — Discharge Summary (Addendum)
[name], [name] NO.:  [id number]  MEDICAL RECORD NO.:  [id number]  LOCATION:  1403                         FACILITY:  [facility]  PHYSICIAN:  Zannie Cove, MD     DATE OF BIRTH:  Apr 01, 1950  DATE OF ADMISSION:  11/13/2010 DATE OF DISCHARGE:  11/22/2010                        DISCHARGE SUMMARY - REFERRING   PRIMARY CARE PROVIDER:  Dr. Domenick Bookbinder with [facility].  ONCOLOGIST:  Dr. Sinclair Grooms at [facility] in [place].  DISCHARGE DIAGNOSES: 1. Dyspnea likely secondary to mucous plug, complicating pleural     effusion and abdominal distention. 2. EtOH cirrhosis. 3. Bilateral pleural effusion, transudative. 4. Transudative ascites. 5. Escherichia coli urinary tract infection. 6. Pulmonary nodule. 7. History of laryngeal cancer, status post laryngectomy. 8. Hypertension. 9. Hypothyroidism.  DISCHARGE MEDICATIONS: 1. Lasix 40 mg p.o. daily. 2. Levothyroxine 25 mcg half a tablet p.o. daily. 3. Propranolol 10 mg p.o. b.i.d. 4. Megace 2 teaspoons p.o. daily. 5. Potassium chloride 20 mEq p.o. b.i.d..  DIAGNOSTIC LABORATORY DATA:  WBCs 8.8, hemoglobin 9.0, hematocrit 26.5, platelets 188, neutrophils 79%, absolute neutrophils 7.0, PT 17.1, and INR 1.37.  Sodium 136, potassium 3.6, chloride 100, CO2 of 21, BUN 7, creatinine 0.56, glucose 93, total bilirubin 1.3, alkaline phosphatase 197, AST 72, ALT 20, albumin 1.7, and calcium 7.6.  Procalcitonin was 0.11.  Lactic acid was 7.5.  ProBNP 519.7.  Urinalysis showed large leukocytes, negative nitrites, 30 protein, moderate blood, trace ketones, small bilirubin, turbid in appearance, many bacteria, and wbc's too numerous to count.  MRSA PCR screening is negative.  TSH 27.001, free T4 is 0.50, and T3 is 1.4.  Cell count for ascites yielded 51 monocyte macrophages, 5 lymphocytes, 44 segmented neutrophils, and 88 WBCs.  Total protein of ascitic fluid 1.2.  LDH of the ascitic fluid 66. Magnesium  is 1.5.  Culture of ascitic fluid yields no growth to date. Pleural fluid total protein is 1.4.  Cell count of pleural fluid, WBCs 389, segmented neutrophils 22, lymphocytes 36, monocyte macrophage 39, and eosinophils 3.  LDH of pleural fluid is 73.  Blood cultures x2, showed no growth to date.  Culture of pleuritic fluid yields WBC's present, both PMN and mononuclear.  No organisms seen.  No growth to date.  Urine culture on September 10th, yields greater than 100,000 colonies of Escherichia coli.  DIAGNOSTIC IMAGING: 1. Chest x-ray done on September 10th, yields left pleural effusion,     associated consolidation, atelectasis versus pneumonia. 2. Abdominal x-ray done on September 10th, yields air within     nondilated loops of bowel. 3. CT angiogram of the chest done on September 10th, yields no     evidence of pulmonary emboli.  Moderate left-sided pleural effusion     with associated atelectasis.  Mild right basilar atelectasis seen.     A small 5-mm nodule at the inferior aspect of the right upper lobe.     This appears to have increased mildly in size from 2011 at which     time, it measured 3 mm.  If the patient had high risk for     bronchogenic carcinoma, followup chest CT in 6 to 12 months is  recommended. 4. CT of the abdomen and pelvis done on September 10th, yields new     large volume abdominopelvic ascites with marked distention of the     abdomen and pelvis.  Apparent new focal laceration or focal     relatively acute devascularization involving the lateral aspect of     the spleen.  No evidence of surrounding hemorrhage.  Diffuse fatty     infiltration within the liver.  No definite hepatic mass seen.     Stable prominence of the proximal right ureter without significant     hydronephrosis.  Nonobstructing stone again noted at the lower pole     of the right kidney.  Scattered calcification along the abdominal     aorta and its branches. 5. Chest ultrasound done on  September 14th, revealed moderate pleural     effusion on left. 6. Chest x-ray done on September 15th, no pneumothorax, post right-     sided thoracentesis.  Grossly unchanged moderate size, left-sided     pleural effusion, and left basilar heterogeneous opacities.  CONSULTATIONS:  None.  PROCEDURES: 1. Ultrasound-guided paracentesis on September 11th. 2. Ultrasound-guided right thoracentesis on September 15th.  BRIEF HISTORY:  [name] is a 60 year old male, status post laryngectomy for laryngeal carcinoma, status post chemoradiation, who is in remission and also who has a history of hypertension, hypothyroidism, alcohol dependence, and tobacco use, presented to the [facility] ED on September 10th with a chief complaint of shortness of breath, phlegm, abdominal distention, and abdominal pain.  He indicated he was in his usual state of health until the day before presentation when he developed phlegm in his throat, was unable to cough it up or clear it. This led to dyspnea.  He denied any chest pain, fever, chills, or recent illnesses.  In addition, he was having worsening abdominal distention since his last hospital discharge on October 16, 2010.  Evaluation in the emergency room revealed a left-sided moderate pleural effusion on CT scan as well as a significant amount of ascites.  Hospitalist were asked to admit for further evaluation and treatment.  HOSPITAL COURSE BY PROBLEM: 1. Dyspnea likely secondary to mucous plug complicated by pleural     effusion and abdominal distention.  The patient was suctioned in     the emergency room and dyspnea resolved.  He was admitted to     telemetry floor, continued on oxygen and bronchodilator     nebulizations.  He was able to maintain his saturations on 35%     trach collar.  He had no further issues with dyspnea during this     hospitalization.  On September 15th, he had a thoracentesis as     indicated above for diagnostic  purposes. 2. Transudative ascites.  The patient had paracentesis on September     11th as indicated above, 1.4 L of fluid was removed; however,     substantial amount of fluid remained, but the patient became     hypotensive.  Cytology was sent as dictated above.  The patient     continues to have ascites with a small amount of fluid leaking from     the paracentesis site. 3. Escherichia coli urinary tract infection.  The patient has     completed a 5-day course of Primaxin. 4. Hypertension.  The patient initially is hypotensive most likely     secondary to the third spacing of fluids.  Of note, his     thoracentesis had to be  postponed twice due to hypotension.  Lasix     was initially held and then restarted on the 16th with parameters     to hold for systolic blood pressure less than 95.  The patient was     started on Inderal and has tolerated that well.  His blood pressure     remained systolically between 90 and 100 and diastolically in the     70s. 5. Hypothyroidism.  TSH was drawn and dictated above.  The patient was     continued on Synthroid.  The patient will need to follow up with     primary care provider to evaluate, dosage adjust his Synthroid. 6. Pulmonary nodule noted on CT scan above.  Outpatient followup. 7. Laryngeal cancer, status post laryngectomy.  PHYSICAL EXAMINATION:  VITAL SIGNS:  Temperature 99.1, blood pressure 100/73, heart rate 95, respiratory rate 16, and saturation 90% on 35% trach collar. GENERAL:  Sitting up in chair, awake, alert, and well-nourished, no acute distress. CV:  Regular rate and rhythm.  No murmur, gallop, or rub.  No lower extremity edema. RESPIRATORY:  Normal effort.  Breath sounds clear, but diminished.  No rhonchi, wheezes, or rales. ABDOMEN:  Soft with fluid and nontender. NEUROLOGIC:  Alert and oriented x3.  Speech clear.  Facial asymmetry.  FOLLOWUP:  The patient will need to see his primary care provider in 7 to 10 days.  He  will need a BMET to evaluate his potassium.  He will need blood pressure check to evaluate his antihypertensives for any adjustments.  DISPOSITION:  The patient is medically stable and ready for discharge. He is being discharged to home.  Time spent, 45 minutes.     Nathaniel Bender, NP   ______________________________ Zannie Cove, MD    KMB/MEDQ  D:  11/22/2010  T:  11/22/2010  Job:  [id number]  cc:   Dr. Domenick Bookbinder.  Dr. Sinclair Grooms.  Electronically Signed by Toya Smothers  on 11/24/2010 07:56:01 AM Electronically Signed by Zannie Cove  on 12/15/2010 02:27:36 PM

## 2010-12-06 LAB — POCT I-STAT, CHEM 8
BUN: 11
Calcium, Ion: 1.12
Chloride: 94 — ABNORMAL LOW
Creatinine, Ser: 1.1
Glucose, Bld: 88
HCT: 39
Hemoglobin: 13.3
Potassium: 3.3 — ABNORMAL LOW
Sodium: 134 — ABNORMAL LOW
TCO2: 31

## 2010-12-06 LAB — BASIC METABOLIC PANEL WITH GFR
BUN: 11
CO2: 28
Calcium: 8.7
Chloride: 97
Creatinine, Ser: 0.83
GFR calc Af Amer: >60
GFR calc non Af Amer: >60
Glucose, Bld: 95
Potassium: 3.6
Sodium: 134 — ABNORMAL LOW

## 2010-12-06 LAB — URINE CULTURE: Colony Count: >=100000

## 2010-12-06 LAB — URINALYSIS, ROUTINE W REFLEX MICROSCOPIC
Bilirubin Urine: NEGATIVE
Glucose, UA: NEGATIVE
Hgb urine dipstick: NEGATIVE
Ketones, ur: NEGATIVE
Nitrite: NEGATIVE
Protein, ur: 30 — AB
Specific Gravity, Urine: 1.018
Urobilinogen, UA: 1
pH: 6.5

## 2010-12-06 LAB — URINE MICROSCOPIC-ADD ON

## 2010-12-06 LAB — COMPREHENSIVE METABOLIC PANEL
ALT: 19
AST: 20
Albumin: 2.6 — ABNORMAL LOW
Alkaline Phosphatase: 96
BUN: 10
Chloride: 94 — ABNORMAL LOW
Potassium: 3.3 — ABNORMAL LOW
Sodium: 132 — ABNORMAL LOW
Total Bilirubin: 1

## 2010-12-06 LAB — CARDIAC PANEL(CRET KIN+CKTOT+MB+TROPI)
CK, MB: 0.5
Relative Index: INVALID
Total CK: 44

## 2010-12-06 LAB — CBC
HCT: 37 — ABNORMAL LOW
Platelets: 260
WBC: 9.6

## 2010-12-06 LAB — BASIC METABOLIC PANEL
BUN: 10
Calcium: 8.4
GFR calc non Af Amer: >60
Glucose, Bld: 93
Potassium: 3.8

## 2010-12-06 LAB — TSH: TSH: 4.63 — ABNORMAL HIGH

## 2010-12-06 LAB — DIFFERENTIAL
Basophils Absolute: 0
Basophils Relative: 0
Eosinophils Absolute: 0.3
Eosinophils Relative: 3
Monocytes Absolute: 0.8
Monocytes Relative: 9
Neutro Abs: 5.4

## 2010-12-06 LAB — POCT CARDIAC MARKERS
CKMB, poc: <1 — ABNORMAL LOW
Myoglobin, poc: 98.3
Troponin i, poc: <0.05

## 2010-12-06 LAB — PROTIME-INR
INR: 1
Prothrombin Time: 13.1

## 2010-12-06 LAB — GLUCOSE, CAPILLARY: Glucose-Capillary: 76

## 2010-12-06 LAB — CK TOTAL AND CKMB (NOT AT ARMC)
CK, MB: 0.4
Relative Index: INVALID
Total CK: 41

## 2010-12-06 LAB — HIV ANTIBODY (ROUTINE TESTING W REFLEX): HIV: NONREACTIVE

## 2010-12-11 ENCOUNTER — Emergency Department (HOSPITAL_COMMUNITY): Payer: Medicaid Other

## 2010-12-11 ENCOUNTER — Emergency Department (HOSPITAL_COMMUNITY)
Admission: EM | Admit: 2010-12-11 | Discharge: 2010-12-12 | Disposition: A | Discharge status: Other Acute Inpatient Hospital | Payer: Medicaid Other | Source: Home / Self Care | Attending: Emergency Medicine | Admitting: Emergency Medicine

## 2010-12-11 DIAGNOSIS — I10 Essential (primary) hypertension: Secondary | ICD-10-CM | POA: Diagnosis present

## 2010-12-11 DIAGNOSIS — E860 Dehydration: Secondary | ICD-10-CM | POA: Diagnosis present

## 2010-12-11 DIAGNOSIS — D638 Anemia in other chronic diseases classified elsewhere: Secondary | ICD-10-CM | POA: Diagnosis present

## 2010-12-11 DIAGNOSIS — K219 Gastro-esophageal reflux disease without esophagitis: Secondary | ICD-10-CM | POA: Diagnosis present

## 2010-12-11 DIAGNOSIS — Z7982 Long term (current) use of aspirin: Secondary | ICD-10-CM

## 2010-12-11 DIAGNOSIS — Z01812 Encounter for preprocedural laboratory examination: Secondary | ICD-10-CM

## 2010-12-11 DIAGNOSIS — Z8521 Personal history of malignant neoplasm of larynx: Secondary | ICD-10-CM

## 2010-12-11 DIAGNOSIS — Z930 Tracheostomy status: Secondary | ICD-10-CM

## 2010-12-11 DIAGNOSIS — T17308A Unspecified foreign body in larynx causing other injury, initial encounter: Principal | ICD-10-CM | POA: Diagnosis present

## 2010-12-11 DIAGNOSIS — I252 Old myocardial infarction: Secondary | ICD-10-CM

## 2010-12-11 DIAGNOSIS — F1020 Alcohol dependence, uncomplicated: Secondary | ICD-10-CM | POA: Diagnosis present

## 2010-12-11 DIAGNOSIS — R188 Other ascites: Secondary | ICD-10-CM | POA: Diagnosis present

## 2010-12-11 DIAGNOSIS — Z87891 Personal history of nicotine dependence: Secondary | ICD-10-CM

## 2010-12-11 DIAGNOSIS — K7030 Alcoholic cirrhosis of liver without ascites: Secondary | ICD-10-CM | POA: Diagnosis present

## 2010-12-11 DIAGNOSIS — R911 Solitary pulmonary nodule: Secondary | ICD-10-CM | POA: Diagnosis present

## 2010-12-11 DIAGNOSIS — Z87440 Personal history of urinary (tract) infections: Secondary | ICD-10-CM

## 2010-12-11 DIAGNOSIS — G4700 Insomnia, unspecified: Secondary | ICD-10-CM | POA: Diagnosis present

## 2010-12-11 DIAGNOSIS — E039 Hypothyroidism, unspecified: Secondary | ICD-10-CM | POA: Diagnosis present

## 2010-12-11 DIAGNOSIS — Z9981 Dependence on supplemental oxygen: Secondary | ICD-10-CM

## 2010-12-11 DIAGNOSIS — Z9089 Acquired absence of other organs: Secondary | ICD-10-CM

## 2010-12-11 LAB — COMPREHENSIVE METABOLIC PANEL
ALT: 13 U/L (ref 0–53)
CO2: 26 mEq/L (ref 19–32)
Calcium: 7.9 mg/dL — ABNORMAL LOW (ref 8.4–10.5)
Chloride: 99 mEq/L (ref 96–112)
GFR calc Af Amer: >90 mL/min (ref >90)
GFR calc non Af Amer: >90 mL/min (ref >90)
Glucose, Bld: 114 mg/dL — ABNORMAL HIGH (ref 70–99)
Sodium: 135 mEq/L (ref 135–145)
Total Bilirubin: 1.2 mg/dL (ref 0.3–1.2)

## 2010-12-11 LAB — CBC
HCT: 22.6 % — ABNORMAL LOW (ref 39.0–52.0)
Platelets: 262 10*3/uL (ref 150–400)
RBC: 2.59 MIL/uL — ABNORMAL LOW (ref 4.22–5.81)
RDW: 15.9 % — ABNORMAL HIGH (ref 11.5–15.5)
WBC: 6.8 10*3/uL (ref 4.0–10.5)

## 2010-12-11 LAB — DIFFERENTIAL
Basophils Absolute: 0 10*3/uL (ref 0.0–0.1)
Eosinophils Relative: 0 % (ref 0–5)
Lymphocytes Relative: 15 % (ref 12–46)
Lymphs Abs: 1 10*3/uL (ref 0.7–4.0)
Neutro Abs: 5.3 10*3/uL (ref 1.7–7.7)
Neutrophils Relative %: 77 % (ref 43–77)

## 2010-12-11 LAB — POCT I-STAT, CHEM 8
Chloride: 100 mEq/L (ref 96–112)
Glucose, Bld: 113 mg/dL — ABNORMAL HIGH (ref 70–99)
HCT: 34 % — ABNORMAL LOW (ref 39.0–52.0)
Hemoglobin: 11.6 g/dL — ABNORMAL LOW (ref 13.0–17.0)
Potassium: 3.7 mEq/L (ref 3.5–5.1)
Sodium: 138 mEq/L (ref 135–145)

## 2010-12-11 LAB — PROTIME-INR
INR: 1.52 — ABNORMAL HIGH (ref 0.00–1.49)
Prothrombin Time: 18.6 seconds — ABNORMAL HIGH (ref 11.6–15.2)

## 2010-12-11 LAB — POCT I-STAT TROPONIN I

## 2010-12-12 ENCOUNTER — Encounter: Payer: Self-pay | Admitting: Family Medicine

## 2010-12-12 ENCOUNTER — Inpatient Hospital Stay (HOSPITAL_COMMUNITY)
Admission: EM | Admit: 2010-12-12 | Discharge: 2010-12-14 | DRG: 155 | Disposition: A | Discharge status: Home Health | Payer: Medicaid Other | Source: Ambulatory Visit | Attending: Family Medicine | Admitting: Family Medicine

## 2010-12-12 DIAGNOSIS — D638 Anemia in other chronic diseases classified elsewhere: Secondary | ICD-10-CM

## 2010-12-12 DIAGNOSIS — R627 Adult failure to thrive: Secondary | ICD-10-CM

## 2010-12-12 DIAGNOSIS — E039 Hypothyroidism, unspecified: Secondary | ICD-10-CM

## 2010-12-12 DIAGNOSIS — R0902 Hypoxemia: Secondary | ICD-10-CM

## 2010-12-12 LAB — BASIC METABOLIC PANEL
BUN: 4 mg/dL — ABNORMAL LOW (ref 6–23)
CO2: 25 mEq/L (ref 19–32)
Calcium: 7.4 mg/dL — ABNORMAL LOW (ref 8.4–10.5)
Creatinine, Ser: 0.62 mg/dL (ref 0.50–1.35)
GFR calc non Af Amer: >90 mL/min (ref >90)
Glucose, Bld: 97 mg/dL (ref 70–99)
Sodium: 135 mEq/L (ref 135–145)

## 2010-12-12 LAB — FERRITIN: Ferritin: 1528 ng/mL — ABNORMAL HIGH (ref 22–322)

## 2010-12-12 LAB — IRON AND TIBC
Saturation Ratios: 47 % (ref 20–55)
UIBC: 56 ug/dL — ABNORMAL LOW (ref 125–400)

## 2010-12-12 LAB — FOLATE: Folate: 11.5 ng/mL
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
## 2010-12-13 LAB — CBC
MCHC: 34.9 g/dL (ref 30.0–36.0)
Platelets: 194 10*3/uL (ref 150–400)
RDW: 16 % — ABNORMAL HIGH (ref 11.5–15.5)
WBC: 4.6 10*3/uL (ref 4.0–10.5)

## 2010-12-13 LAB — GLUCOSE, CAPILLARY: Glucose-Capillary: 119 mg/dL — ABNORMAL HIGH (ref 70–99)

## 2010-12-13 LAB — LACTATE DEHYDROGENASE: LDH: 170 U/L (ref 94–250)

## 2010-12-14 ENCOUNTER — Emergency Department (HOSPITAL_COMMUNITY)
Admission: EM | Admit: 2010-12-14 | Discharge: 2010-12-14 | Disposition: A | Discharge status: Home / Self Care | Payer: Medicaid Other | Attending: Emergency Medicine | Admitting: Emergency Medicine

## 2010-12-14 DIAGNOSIS — I10 Essential (primary) hypertension: Secondary | ICD-10-CM | POA: Insufficient documentation

## 2010-12-14 DIAGNOSIS — R143 Flatulence: Secondary | ICD-10-CM | POA: Insufficient documentation

## 2010-12-14 DIAGNOSIS — R141 Gas pain: Secondary | ICD-10-CM | POA: Insufficient documentation

## 2010-12-14 DIAGNOSIS — K7030 Alcoholic cirrhosis of liver without ascites: Secondary | ICD-10-CM | POA: Insufficient documentation

## 2010-12-14 DIAGNOSIS — R142 Eructation: Secondary | ICD-10-CM | POA: Insufficient documentation

## 2010-12-14 DIAGNOSIS — R0602 Shortness of breath: Secondary | ICD-10-CM | POA: Insufficient documentation

## 2010-12-14 DIAGNOSIS — E039 Hypothyroidism, unspecified: Secondary | ICD-10-CM | POA: Insufficient documentation

## 2010-12-14 DIAGNOSIS — R000 Tachycardia, unspecified: Secondary | ICD-10-CM | POA: Insufficient documentation

## 2010-12-14 DIAGNOSIS — Z8521 Personal history of malignant neoplasm of larynx: Secondary | ICD-10-CM | POA: Insufficient documentation

## 2010-12-14 DIAGNOSIS — F1020 Alcohol dependence, uncomplicated: Secondary | ICD-10-CM | POA: Insufficient documentation

## 2010-12-14 DIAGNOSIS — Z79899 Other long term (current) drug therapy: Secondary | ICD-10-CM | POA: Insufficient documentation

## 2010-12-14 NOTE — Discharge Summary (Signed)
Physician Discharge Summary  [name] MRN: [id number] DOB/AGE: [birth year] 60 y.o.  Admit date: 12/12/2010 Discharge date: 12/14/2010  PCP: Barnabas Lister, MD [facility]     Discharge Diagnosis:  Primary 1. Mucus plug causing respiratory distress in patient with stoma due to laryngectomy 2. Decreased functional status due to chronic disease  Secondary 1. Laryngeal carcinoma status post laryngectomy and chemoradiation.   2. Tobacco use-quit 1 month ago  3. Alcohol use-every other day  4. Essential hypertension.   5. Alcoholic cirrhosis with ascites.   6. Pulmonary nodule with recommendations for repeat CT in 6-12 months       from September 2012.   7. Hypothyroidism.   8. Anemia of chronic disease.   9. Questionable history of cardiac arrest noted on most recent H and       P, but not on other documentation, the patient denies  10. history of UTIs  and Trichomonas.    Current Discharge Medication List  Aspirin 81 mg by mouth daily  Levothyroxine by mouth daily Propranolol 10mg  1 tablet by mouth BID Potassium Chloride BID Megace 400 mg/76ml. Take 400 mg by mouth daily.  Tums 200 mg TID prn Resource Peach Liquid 240 ml PO TID Patient to stop taking lasix 40 mg 1 tablet by mouth daily   Hospital Course [name] is a 60 y.o. year old male with a history of larygneal carcinoma s/p laryngectomy and chemoradiation now with stoma, alcoholic cirrhosis, HTN, hypothryoidism presenting with shortness of breath and cough to [place] ED With drastic improvement after removal of mucus plug transferred to [facility] for further care.   Problem List 1. Respiratory distress-improved with suctioning at [facility]. Patient stable on 60% TC which was able to be weaned to 40%. Plan was to give suctioning teaching to family by respiratory therapy but family never visited patient. Patient will have HHRN who will help with instructions on teaching  family how to suction.  2. Traumatic Cath-patient with traumatic cath at [facility] ED. Urine initially with red tint. Attempted to obtain UA but order not entered by staff then not obtained once entered despite multiple attempts to have UA obtained. Urine cleared so UA was discontinued. Haptoglobin and LDH obtained to rule out hemolysis and these were wnl.  3. Elevated lactate to 4.9: Patient with chronically elevated level likely secondary to ETOH abuse and liver disease. With hydrating decreased to 3.2. No  signs of infection so did not continue to follow.  Patient has not had any recent lactic acids lower than 2.  4. Dehydration-patient did not eat as a result of SOB on day of admission. Initially tachy to 140 trending down with hydration to <100. Held lasix which patient had been started on during most recent hospitalization. Due to tachy, monitored on tele. Patient also with a possible history of MI and POC troponin not elevated and EKG with sinus tach at [facility].  5. Alcoholic Cirrhosis-LFTs consistent with previous value-mildly elevated. Patient on lasix per recent D/C from [place]. Held Lasix. Continue propranolol. Alcohol level not elevated and patient reportedly only drinking 1 beer every other day. Patient showed no signs of withdrawal.  *Patient will likely benefit from spironolactone 25mg  and lasix 10mg  on an outpatient basis.  6. HTN/CV-moderate control with max sbp 143. continued on home propranolol 10mg  BID. Previously had been on lopressor 50 BID before recent admission. Continued ASA  7. Hypothryoidism-TSH elevated to >18. Increased  synthroid to 50 mcg from 12.5 mcg.  8. Anemia of Chronic Disease-confirmed by ferritin >1500 and otherwise normal anemia panel. Hgb stable.  9. Insomnia-ambien 5mg  while in house. Will not continue  10. Decreased functional status-option of placement discussed but patient in SNF on 3 separate occasions and poor experience. Patient refuses. May need to  readdress if continued admissions. Patient home with home health RN. Also home health PT/OT eval. Patient will go home with rolling walker and bed side toilet per PT/OT recommendations.     Procedures/Imaging:  1. CXR 9/19  Improving infiltration or atelectasis in the left lung base.   Improving left pleural effusion from 9/19 CXR. Peribronchial thickening suggesting chronic bronchitis.    Labs  Lab Results  Component Value Date   IRON 49 12/12/2010   TIBC 105* 12/12/2010   FERRITIN 1528* 12/12/2010   Lab Results  Component Value Date   NA 135 12/12/2010   K 3.6 12/12/2010   CL 102 12/12/2010   CO2 25 12/12/2010   Lab Results  Component Value Date   CREATININE 0.62 12/12/2010   Lab Results  Component Value Date   ALT 13 12/11/2010   AST 38* 12/11/2010   ALKPHOS 150* 12/11/2010   BILITOT 1.2 12/11/2010   Lab Results  Component Value Date   TSH 18.958* 12/12/2010   Results for orders placed during the hospital encounter of 12/12/10 (from the past 48 hour(s))  LACTATE DEHYDROGENASE     Status: Normal   Collection Time   12/13/10  5:15 AM      Component Value Range Comment   LDH 170  94 - 250 (U/L)   CBC     Status: Abnormal   Collection Time   12/13/10  5:15 AM      Component Value Range Comment   WBC 4.6  4.0 - 10.5 (K/uL)    RBC 2.79 (*) 4.22 - 5.81 (MIL/uL)    Hemoglobin 8.2 (*) 13.0 - 17.0 (g/dL)    HCT 11.9 (*) 14.7 - 52.0 (%)    MCV 84.2  78.0 - 100.0 (fL)    MCH 29.4  26.0 - 34.0 (pg)    MCHC 34.9  30.0 - 36.0 (g/dL)    RDW 82.9 (*) 56.2 - 15.5 (%)    Platelets 194  150 - 400 (K/uL)   HAPTOGLOBIN     Status: Normal   Collection Time   12/13/10  5:15 AM      Component Value Range Comment   Haptoglobin 137  30 - 200 (mg/dL)   GLUCOSE, CAPILLARY     Status: Normal   Collection Time   12/13/10 10:56 AM      Component Value Range Comment   Glucose-Capillary 77  70 - 99 (mg/dL)   GLUCOSE, CAPILLARY     Status: Abnormal   Collection Time   12/13/10  4:50 PM       Component Value Range Comment   Glucose-Capillary 119 (*) 70 - 99 (mg/dL)    Comment 1 Notify RN        Patient condition at time of discharge/disposition: stable  Disposition-home with home health RN as well as home health PT/OT eval   Pertinent Physical exam: protuberant abdomen with mild fluid wave stable over hospitalization without lasix    Follow up issues: 1. Please make sure patient's family has been receiving training per home health on suctioning. Mucus plugs have brought Mr. Lundahl into the hospital on 2 occasions within the last month.  2. Please monitor abdominal ascites. Please consider a regimen of spironolactone 25 and lasix 10. Patient had been on lasix 40 but came in dehydrated so this was held. Follow up blood pressure to make sure patient can tolerate regimen 3. Consider screening for depression.  4 please repeat tsh in 4-6 weeks as patient had increased dose while in hospital   Discharge Orders    Future Appointments: Provider: Department: Dept Phone: Center:   12/15/2010 9:45 AM Lurline Hare [facility] [phone] None   12/16/2010 1:30 PM Lajoyce Corners Charolett Bumpers Med Resident [phone] [facility]     Dictation [phone]  Signed: Tana Conch 12/14/2010, 9:59 AM

## 2010-12-15 ENCOUNTER — Emergency Department (HOSPITAL_COMMUNITY)
Admission: EM | Admit: 2010-12-15 | Discharge: 2010-12-15 | Disposition: A | Discharge status: Home / Self Care | Payer: Medicaid Other | Attending: Emergency Medicine | Admitting: Emergency Medicine

## 2010-12-15 ENCOUNTER — Emergency Department (HOSPITAL_COMMUNITY): Payer: Medicaid Other

## 2010-12-15 ENCOUNTER — Ambulatory Visit: Payer: No Typology Code available for payment source | Admitting: Radiation Oncology

## 2010-12-15 DIAGNOSIS — I10 Essential (primary) hypertension: Secondary | ICD-10-CM | POA: Insufficient documentation

## 2010-12-15 DIAGNOSIS — R0602 Shortness of breath: Secondary | ICD-10-CM | POA: Insufficient documentation

## 2010-12-15 DIAGNOSIS — Z8521 Personal history of malignant neoplasm of larynx: Secondary | ICD-10-CM | POA: Insufficient documentation

## 2010-12-15 DIAGNOSIS — Z79899 Other long term (current) drug therapy: Secondary | ICD-10-CM | POA: Insufficient documentation

## 2010-12-15 DIAGNOSIS — K7460 Unspecified cirrhosis of liver: Secondary | ICD-10-CM | POA: Insufficient documentation

## 2010-12-15 DIAGNOSIS — J90 Pleural effusion, not elsewhere classified: Secondary | ICD-10-CM | POA: Insufficient documentation

## 2010-12-15 DIAGNOSIS — Z8674 Personal history of sudden cardiac arrest: Secondary | ICD-10-CM | POA: Insufficient documentation

## 2010-12-15 DIAGNOSIS — R0682 Tachypnea, not elsewhere classified: Secondary | ICD-10-CM | POA: Insufficient documentation

## 2010-12-15 DIAGNOSIS — R062 Wheezing: Secondary | ICD-10-CM | POA: Insufficient documentation

## 2010-12-15 DIAGNOSIS — Z930 Tracheostomy status: Secondary | ICD-10-CM | POA: Insufficient documentation

## 2010-12-15 LAB — CBC
MCHC: 34.5 g/dL (ref 30.0–36.0)
Platelets: 211 10*3/uL (ref 150–400)
RDW: 15.9 % — ABNORMAL HIGH (ref 11.5–15.5)
WBC: 8.6 10*3/uL (ref 4.0–10.5)

## 2010-12-15 LAB — POCT I-STAT, CHEM 8
Calcium, Ion: 1.1 mmol/L — ABNORMAL LOW (ref 1.12–1.32)
Creatinine, Ser: 0.8 mg/dL (ref 0.50–1.35)
Glucose, Bld: 82 mg/dL (ref 70–99)
HCT: 30 % — ABNORMAL LOW (ref 39.0–52.0)
Hemoglobin: 10.2 g/dL — ABNORMAL LOW (ref 13.0–17.0)
Potassium: 4.3 mEq/L (ref 3.5–5.1)

## 2010-12-15 LAB — URINALYSIS, ROUTINE W REFLEX MICROSCOPIC
Glucose, UA: NEGATIVE mg/dL
Ketones, ur: NEGATIVE mg/dL
Protein, ur: NEGATIVE mg/dL
pH: 7 (ref 5.0–8.0)

## 2010-12-15 LAB — URINE MICROSCOPIC-ADD ON

## 2010-12-15 LAB — DIFFERENTIAL
Basophils Absolute: 0 10*3/uL (ref 0.0–0.1)
Basophils Relative: 0 % (ref 0–1)
Eosinophils Relative: 1 % (ref 0–5)
Monocytes Absolute: 0.8 10*3/uL (ref 0.1–1.0)

## 2010-12-15 NOTE — Progress Notes (Signed)
Subjective:    [name], male    DOB: 1950/04/24, 60 y.o.   MRN: [id number]  HPI 60 yo male with recent admission for respiratory distress secondary to mucous plugging who return on same day of discharge due to respiratory distress.  Pt was discharged home after pt declined placement on 3 separate occasions, went home, family saw pt was unable to suction pt called EMS and pt was brought back here, pt was suction put on humidified air and pt back at his baseline with sats in the 80's on 40% trach collar. Pt physical exam unchanged from discharge with scattered rhonchi but good air movement, pt afebrile and CXR mild concern for increase in ? Consolidation and some effusion but when recheck does not appear much change physically. Dr. Veverly Fells will send pt home on levaquin.  Attempted to call family members on multiple occasions and 2 phone numbers are out of service and the one that does work does not have name on voice mail. Did leave 3 messages on this number [phone] without a call back.  Discussed at length with pt that we are concern that he is not getting the care he needs at home. Pt though states he would not like placement. With family being unable to be reached concern they are neglecting pt who needs fairly acute care.  Pt states he has family scheduled to be there to help with his teaching for suctioning with home health nurse today.  Made plan with pt that if he needs to return to ED again then we need to have pt with placement. Pt agreed with plan.   Primary  1. Mucus plug causing respiratory distress in patient with stoma due to laryngectomy  2. Decreased functional status due to chronic disease  Secondary  1. Laryngeal carcinoma status post laryngectomy and chemoradiation.  2. Tobacco use-quit 1 month ago  3. Alcohol use-every other day  4. Essential hypertension.  5. Alcoholic cirrhosis with ascites.  6. Pulmonary nodule with recommendations for repeat CT in 6-12  months  from September 2012.  7. Hypothyroidism.  8. Anemia of chronic disease.  9. Questionable history of cardiac arrest noted on most recent H and  P, but not on other documentation, the patient denies  10. history of UTIs and Trichomonas.   Current Discharge Medication List   Aspirin 81 mg by mouth daily  Levothyroxine by mouth daily  Propranolol 10mg  1 tablet by mouth BID  Potassium Chloride BID  Megace 400 mg/4ml. Take 400 mg by mouth daily.  Tums 200 mg TID prn  Resource Peach Liquid 240 ml PO TID  Patient to stop taking lasix 40 mg 1 tablet by mouth daily  Levaquin 750 mg PO daily for next 7 days.   Pt recent Discharge summary below:  Hospital Course  [name] is a 60 y.o. year old male with a history of larygneal carcinoma s/p laryngectomy and chemoradiation now with stoma, alcoholic cirrhosis, HTN, hypothryoidism presenting with shortness of breath and cough to [facility] ED With drastic improvement after removal of mucus plug transferred to [facility] for further care.  Problem List  1. Respiratory distress-improved with suctioning at [facility]. Patient stable on 60% TC which was able to be weaned to 40%. Plan was to give suctioning teaching to family by respiratory therapy but family never visited patient. Patient will have HHRN who will help with instructions on teaching family how to suction.  2. Traumatic Cath-patient with traumatic  cath at [facility] ED. Urine initially with red tint. Attempted to obtain UA but order not entered by staff then not obtained once entered despite multiple attempts to have UA obtained. Urine cleared so UA was discontinued. Haptoglobin and LDH obtained to rule out hemolysis and these were wnl.  3. Elevated lactate to 4.9: Patient with chronically elevated level likely secondary to ETOH abuse and liver disease. With hydrating decreased to 3.2. No signs of infection so did not continue to follow. Patient has not had any recent lactic  acids lower than 2.  4. Dehydration-patient did not eat as a result of SOB on day of admission. Initially tachy to 140 trending down with hydration to <100. Held lasix which patient had been started on during most recent hospitalization. Due to tachy, monitored on tele. Patient also with a possible history of MI and POC troponin not elevated and EKG with sinus tach at [facility].  5. Alcoholic Cirrhosis-LFTs consistent with previous value-mildly elevated. Patient on lasix per recent D/C from [place]. Held Lasix. Continue propranolol. Alcohol level not elevated and patient reportedly only drinking 1 beer every other day. Patient showed no signs of withdrawal.  *Patient will likely benefit from spironolactone 25mg  and lasix 10mg  on an outpatient basis.  6. HTN/CV-moderate control with max sbp 143. continued on home propranolol 10mg  BID. Previously had been on lopressor 50 BID before recent admission. Continued ASA  7. Hypothryoidism-TSH elevated to >18. Increased synthroid to 50 mcg from 12.5 mcg.  8. Anemia of Chronic Disease-confirmed by ferritin >1500 and otherwise normal anemia panel. Hgb stable.  9. Insomnia-ambien 5mg  while in house. Will not continue  10. Decreased functional status-option of placement discussed but patient in SNF on 3 separate occasions and poor experience. Patient refuses. May need to readdress if continued admissions. Patient home with home health RN. Also home health PT/OT eval. Patient will go home with rolling walker and bed side toilet per PT/OT recommendations.  Review of Systems    Objective:   Physical Exam    Assessment & Plan:

## 2010-12-16 ENCOUNTER — Telehealth: Payer: Self-pay | Admitting: Family Medicine

## 2010-12-16 ENCOUNTER — Ambulatory Visit: Payer: No Typology Code available for payment source | Admitting: Family Medicine

## 2010-12-16 DIAGNOSIS — R188 Other ascites: Secondary | ICD-10-CM | POA: Insufficient documentation

## 2010-12-16 DIAGNOSIS — J189 Pneumonia, unspecified organism: Secondary | ICD-10-CM | POA: Insufficient documentation

## 2010-12-16 LAB — URINE CULTURE: Colony Count: 6000

## 2010-12-16 NOTE — Telephone Encounter (Signed)
Need flexible suction catheters to make it easier for suction.  Pt coming in for appt today.  Diane from [facility] wanted you to know this before he comes.

## 2010-12-20 NOTE — Telephone Encounter (Signed)
Patient was a no show.  Can you please send me a phone number for Diane from [facility] - maybe I can give her a verbal order.  Thanks!

## 2010-12-20 NOTE — Telephone Encounter (Signed)
[phone] 

## 2010-12-21 LAB — CULTURE, BLOOD (ROUTINE X 2)
Culture  Setup Time: 201210110849
Culture: NO GROWTH

## 2010-12-23 ENCOUNTER — Telehealth: Payer: Self-pay | Admitting: Family Medicine

## 2010-12-23 NOTE — Telephone Encounter (Signed)
Ivy---FYI

## 2010-12-23 NOTE — Telephone Encounter (Signed)
States at her visit today that his heart rate was 120 - but he had not taken his inderol His temp is 100.1-sats 90% - but he was just discharged from [facility] for pneumonia She just needed to report this.
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
## 2010-12-29 NOTE — Discharge Summary (Signed)
[name], [name] NO.:  [id number]  MEDICAL RECORD NO.:  [id number]  LOCATION:                                 FACILITY:  PHYSICIAN:  Dr. Deirdre Priest          DATE OF BIRTH:  1950-06-19  DATE OF ADMISSION:  12/12/2010 DATE OF DISCHARGE:  12/14/2010                              DISCHARGE SUMMARY   PRIMARY CARE PHYSICIAN:  Barnabas Lister, MD of [facility].  DISCHARGE DIAGNOSES:  Primary: 1. Mucous plug causing respiratory distress in a patient with stoma     due to laryngectomy. 2. Decreased functional status due to chronic disease.  Secondary: 1. Laryngeal carcinoma, status post laryngectomy and chemoradiation. 2. Tobacco use, quit one month ago. 3. Alcohol use every other day. 4. Essential hypertension. 5. Alcoholic cirrhosis with ascites. 6. Pulmonary nodule with recommendations to repeat CT in 6-12 months     from September 2012. 7. Hypothyroidism. 8. Anemia of chronic disease. 9. Questionable history of cardiac arrest, noted on most recent H and     P, but not on other documentation, the patient denies. 10.History of urinary tract infection.  DISCHARGE MEDICATION: 1. Aspirin 81 mg by mouth daily. 2. Levothyroxine 50 mcg by mouth daily. 3. Propranolol 10 mg 1 tablet by mouth b.i.d. 4. Potassium chloride 20 mEq b.i.d. 5. Megace 100 mg by mouth daily. 6. Tums 200 mg t.i.d. p.r.n. 7. Resource peach liquid 240 mL p.o. t.i.d. 8. The patient to stop taking 40 mg Lasix 1 tablet by mouth daily.  HOSPITAL COURSE:  [name] is a 60 year old male with a history of laryngeal carcinoma, status post laryngectomy and chemoradiation, now with stoma, alcoholic cirrhosis, hypertension, hypothyroidism presenting with shortness of breath and cough to the [facility] ED with drastic improvement after removal of mucous plug, transferred to [facility] for further care.  PROBLEMS LIST: 1. Respiratory distress, improved with suctioning at  [facility].  The     patient stable on 60% trach; however, this was able to be weaned to     40% at the time of discharge.  Plan was to give suctioning teaching     to family by Respiratory Therapy, though family never visited the     patient.  The patient will have home health RN, who will help with     instructions on teaching family how to suction. 2. Traumatic cath.  The patient with traumatic cath at [facility] ED.     Urine initially with red tint, attempted to obtain UA but order not     entered by staff and not obtained despite multiple attempts     to have obtained by staff. The urine cleared, so UA was discontinued. 3. Elevated lactate of 4.9.  The patient with chronically elevated     level of lactate secondary to alcohol abuse and liver disease.     With hydrating, it decreased to 3.2.  No signs of infection, so did     not continue to follow.  The patient has not had any recent lactic     acid lower than 2. 4. Dehydration.  The patient did  not eat as a result of shortness of     breath on the day of admission.  Initially, tachy to 140, trending     down with hydration to less than 100.  Held Lasix, which the     patient had been started on during most recent hospitalization on     November 23, 2010.  Due to tachycardia, monitored on telemetry.     The patient also with a possible history of MI and troponin not     elevated and EKG with sinus tach at [facility].  No complaints of     chest pain. 5. Alcoholic cirrhosis.  This is consistent with previous value mildly     elevated.  The patient on Lasix per recent hospitalization from [facility].  Held Lasix.  Continued propranolol.  Alcohol level not     elevated and the patient reported only drinking one beer every     other day.  The patient showed no signs of withdrawal and was not     placed on CIWA. *  The patient will likely benefit from spironolactone 25 mg and Lasix 10 mg on outpatient basis. 1.  Hypertension/CV.  Moderate control with max systolic blood pressure     of 143.  Continued on home proprandol 10 mg b.i.d., previously had     been on Lopressor 50 b.i.d. before recent admission.  Continued     aspirin. 2. Hypothyroidism.  TSH elevated to greater than 18.  Increased     Synthroid to 50 mcg from 12.5 mcg. 3. Anemia of chronic disease.  Confirmed by ferritin of greater than     1500, and otherwise normal anemia panel.  Hemoglobin stable. 4. Insomnia.  Ambien 5 mg one half.  Soma continued. 5. Decreased functional status.  Option of placement discussed with     the patient but 3x previously in SNF on and poor experience.  The patient     refused at this time.  We need to readdress if continued     admissions.  The patient went home with home health RN.  Also home     health PT/OT eval.  The patient will go home with a rolling walker     and advised toilet per PT/OT recommendations.  RADIOLOGY/IMAGING:  Chest x-ray on November 23, 2010, showed improving infiltration or atelectasis of the left lung base.  Improving left pleural effusion from November 23, 2010 chest x-ray.  Peribronchial thickening suggesting chronic bronchitis.  LABORATORY DATA: 1. Iron 49, TIBC 105, ferritin 1528. 2. Electrolytes:  Sodium 135, potassium 3.6, chloride 102, CO2 of 25. 3. Creatinine of 0.62. 4. LFTs:  ALT 13, AST 38, alk phos 150, bili total 1.2. 5. TSH of 18.9. 6. LDH of 170. 7. Haptoglobin 137. 8. CBC with a white count of 4.6, hemoglobin 8.2, platelets 194.  The patient's condition at the time of discharge/disposition:  Stable. Disposition to home with home health RN as well as home health PT/OT eval.  PERTINENT PHYSICAL EXAMINATION:  Protuberant abdomen with mild fluid wave, stable over hospitalization without Lasix.  FOLLOWUP ISSUES: 1. Please make sure the patient's family has been receiving training     for home of home suctioning.  Mucous plugs have brought Mr. Schwinn      into the hospital on two occasions within the last month. 2. Please monitor abdominal ascites.  Please consider regimen of     spironolactone 25 and Lasix 10.  The  patient had lasix 40 mg but     came in dehydrated, so this was held.  Follow up blood pressure to     make sure the patient can tolerate regimen. 3. Consider screening for depression. 4. Please repeat TSH in 4-6 weeks as the patient had increased dose in     hospital.  DISCHARGE FOLLOWUP: 1. With Barnabas Lister, MD of Family Medicine on December 16, 2010 at     1:30 p.m. 2. With Radiation Oncology appointment on December 15, 2010 at 9:45     a.m.    ______________________________ Tana Conch, MD   ______________________________ Dr. Deirdre Priest    SH/MEDQ  D:  12/14/2010  T:  12/14/2010  Job:  [id number]  Electronically Signed by Tana Conch MD on 12/17/2010 09:30:33 PM Electronically Signed by Pearlean Brownie M.D. on 12/29/2010 02:41:45 PM

## 2011-01-05 ENCOUNTER — Ambulatory Visit (INDEPENDENT_AMBULATORY_CARE_PROVIDER_SITE_OTHER): Payer: No Typology Code available for payment source | Admitting: Family Medicine

## 2011-01-05 ENCOUNTER — Encounter: Payer: Self-pay | Admitting: Family Medicine

## 2011-01-05 VITALS — BP 87/63 | HR 114 | Temp 99.1°F | Ht 69.0 in | Wt 91.2 lb

## 2011-01-05 DIAGNOSIS — C329 Malignant neoplasm of larynx, unspecified: Secondary | ICD-10-CM

## 2011-01-05 DIAGNOSIS — E039 Hypothyroidism, unspecified: Secondary | ICD-10-CM

## 2011-01-05 DIAGNOSIS — I10 Essential (primary) hypertension: Secondary | ICD-10-CM

## 2011-01-05 MED ORDER — MEGESTROL ACETATE 400 MG/10ML PO SUSP: 400.0000 mg | Freq: Every day | ORAL | Status: DC

## 2011-01-05 MED ORDER — PROPRANOLOL HCL 10 MG PO TABS: 10.0000 mg | ORAL_TABLET | Freq: Three times a day (TID) | ORAL | Status: DC

## 2011-01-05 MED ORDER — LEVOTHYROXINE SODIUM 50 MCG PO TABS: 50.0000 ug | ORAL_TABLET | Freq: Every day | ORAL | Status: DC

## 2011-01-05 NOTE — Patient Instructions (Addendum)
Please fill prescriptions for Propranolol, Synthroid, Megace, and flexible suction catheters. Continue to suction stoma to prevent mucus plugging and hospital visits. Return to clinic in 3 months - please bring all medication bottles at that time.

## 2011-01-06 LAB — TSH: TSH: 16.327 u[IU]/mL — ABNORMAL HIGH (ref 0.350–4.500)

## 2011-01-07 ENCOUNTER — Encounter: Payer: Self-pay | Admitting: Family Medicine

## 2011-01-07 NOTE — Assessment & Plan Note (Signed)
S/p chemo and radiation.  Now with stoma that becomes plugged because patient and family do not suction it out. Spoke with son today regarding placement - son and patient politely decline. Strongly expressed the importance of suctioning mucus plug in stoma to prevent frequent hospitalizations. Will write Rx for flexible suction catheters. Follow up in 3 months.

## 2011-01-07 NOTE — Assessment & Plan Note (Addendum)
BP very low today 87/63. Was started on Propanolol 10 mg TID at last hospitalization. Home Health RN comes out to check patient's BP. Will refill medication for now - told patient to hold if SBP < 90 or he feels lightheaded or weak. Follow up in 3 months - recheck BP and adjust or discontinue Propanolol as necessary.

## 2011-01-07 NOTE — Progress Notes (Signed)
  Subjective:    [name], male    DOB: October 12, 1950, 60 y.o.   MRN: [id number]  HPI  Patient presents to clinic for hospital follow up for respiratory distress secondary to mucus plugging of stoma.  At last hospitalization, FPTS discussed placement because patient was not receiving enough care at home.  He has been admitted to hospital and been to ED multiple times for mucous plugging/SOB.  Was told that if he returns to [facility] again, he will be placed in a SNF.  Therefore, patient went to [facility] a few weeks ago and was hospitalized for PNA.  He was discharged home and since then has had no complaints.  Son states that he and his sister (who both live at home with patient) have learned how to suction out patient's stoma.  Son is very adamant about no SNF placement for patient.  They believe he is not at that point yet.  Patient endorses decreased appetite and inability to gain weight.  Denies fever, chills, night sweats, cough, congestion, chest pain.   Review of Systems  Per HPI    Objective:   Physical Exam  Constitutional: No distress.       Chronically ill appearing  HENT:  Mouth/Throat: Oropharynx is clear and moist.  Neck: Normal range of motion. Neck supple. No JVD present. Erythema present.       Stoma - clear and dry  Cardiovascular:       Distant heart sounds, no murmur appreciated  Pulmonary/Chest: Effort normal and breath sounds normal. No respiratory distress. He has no wheezes. He has no rales.  Lymphadenopathy:    He has no cervical adenopathy.  Skin: Skin is warm and dry.          Assessment & Plan:

## 2011-01-07 NOTE — Assessment & Plan Note (Signed)
Will recheck TSH today. Patient and son are not sure if patient takes Synthroid daily. Advised them to bring in all medication bottles to next office visit. Will give refill for Synthroid 50 mg PO daily.  May need to adjust as necessary. Will call or send letter with results. Patient agreed/understood plan.

## 2011-01-12 ENCOUNTER — Telehealth: Payer: Self-pay | Admitting: Family Medicine

## 2011-01-12 NOTE — Telephone Encounter (Signed)
Angie with [facility] called to say Mr. Villella has been discharged from receiving services today.  Any questions call number given

## 2011-01-18 MED ORDER — LEVOTHYROXINE SODIUM 125 MCG PO CAPS: 1.0000 | ORAL_CAPSULE | Freq: Every day | ORAL | Status: DC

## 2011-01-18 NOTE — Progress Notes (Signed)
Addended by: Tye Savoy, IVY on: 01/18/2011 11:56 AM   Modules accepted: Orders

## 2011-06-19 ENCOUNTER — Ambulatory Visit: Payer: No Typology Code available for payment source

## 2011-06-22 ENCOUNTER — Encounter: Payer: Self-pay | Admitting: Family Medicine

## 2011-06-22 ENCOUNTER — Ambulatory Visit (INDEPENDENT_AMBULATORY_CARE_PROVIDER_SITE_OTHER): Payer: Self-pay | Admitting: Family Medicine

## 2011-06-22 VITALS — BP 126/82 | HR 76 | Temp 98.9°F | Ht 67.0 in | Wt 120.0 lb

## 2011-06-22 DIAGNOSIS — C329 Malignant neoplasm of larynx, unspecified: Secondary | ICD-10-CM

## 2011-06-22 DIAGNOSIS — I10 Essential (primary) hypertension: Secondary | ICD-10-CM

## 2011-06-22 DIAGNOSIS — J309 Allergic rhinitis, unspecified: Secondary | ICD-10-CM | POA: Insufficient documentation

## 2011-06-22 NOTE — Assessment & Plan Note (Signed)
[name] site appeared clear today. Will order her Ultra voice device per patient's request.

## 2011-06-22 NOTE — Patient Instructions (Signed)
Start taking Benadryl 25 mg 1 tablet daily as needed for allergic rhinitis. Pick up Flonase in both nostrils twice a day as needed for sneezing and runny nose. I will fax Ultra Voice papers and Rx next week. Please inform your pharmacy to send refills to Dr. Tye Savoy from now on. Schedule follow up appointment with me in 3-4 months.  Allergic Rhinitis Allergic rhinitis is when the mucous membranes in the nose respond to allergens. Allergens are particles in the air that cause your body to have an allergic reaction. This causes you to release allergic antibodies. Through a chain of events, these eventually cause you to release histamine into the blood stream (hence the use of antihistamines). Although meant to be protective to the body, it is this release that causes your discomfort, such as frequent sneezing, congestion and an itchy runny nose.  CAUSES  The pollen allergens may come from grasses, trees, and weeds. This is seasonal allergic rhinitis, or "hay fever." Other allergens cause year-round allergic rhinitis (perennial allergic rhinitis) such as house dust mite allergen, pet dander and mold spores.  SYMPTOMS   Nasal stuffiness (congestion).   Runny, itchy nose with sneezing and tearing of the eyes.   There is often an itching of the mouth, eyes and ears.  It cannot be cured, but it can be controlled with medications. DIAGNOSIS  If you are unable to determine the offending allergen, skin or blood testing may find it. TREATMENT   Avoid the allergen.   Medications and allergy shots (immunotherapy) can help.   Hay fever may often be treated with antihistamines in pill or nasal spray forms. Antihistamines block the effects of histamine. There are over-the-counter medicines that may help with nasal congestion and swelling around the eyes. Check with your caregiver before taking or giving this medicine.  If the treatment above does not work, there are many new medications your caregiver  can prescribe. Stronger medications may be used if initial measures are ineffective. Desensitizing injections can be used if medications and avoidance fails. Desensitization is when a patient is given ongoing shots until the body becomes less sensitive to the allergen. Make sure you follow up with your caregiver if problems continue. SEEK MEDICAL CARE IF:   You develop fever (more than 100.5 F (38.1 C).   You develop a cough that does not stop easily (persistent).   You have shortness of breath.   You start wheezing.   Symptoms interfere with normal daily activities.  Document Released: 11/15/2000 Document Revised: 02/09/2011 Document Reviewed: 05/27/2008 [facility] Patient Information 2012 [place], [place].

## 2011-06-22 NOTE — Assessment & Plan Note (Addendum)
Flonase for sneezing and nasal congestion as directed.  Benadryl 25 mg as needed for seasonal allergies. Followup in 3 months.

## 2011-06-22 NOTE — Progress Notes (Signed)
  Subjective:    [name], male    DOB: 29-Sep-1950, 61 y.o.   MRN: [id number]  HPI  Patient presents to clinic for followup trach site. [name] daughter-in-law is present to translate. They say that ever since patient was admitted to [facility] for pneumonia about one year ago, patient has been doing very well. He has not needed to go to the hospital for mucus plugging of his trach site. Family has been good about suctioning mucous out of the opening, but lately they have not needed to do so. Patient is able to cough up phlegm on his own. He is actually gaining weight after starting Megace.  He does complain of "swelling and tightness" in the back of his throat. This is associated with sneezing and runny nose. It has been going on for about one week. Patient thinks he may be allergic to pollen. Endorses cough, but this is chronic. Sputum is clear. Denies any fevers, chills, night sweats, nausea or vomiting.   Hypertension: Per patient, he was told by a doctor at [facility] to stop taking propanolol because his blood pressure was too low. He currently takes spironolactone and Lasix for blood pressure control. He denies any headaches, chest pain, fatigue.  Review of Systems Per history of present illness    Objective:   Physical Exam  Constitutional: No distress.  HENT:  Head: Normocephalic and atraumatic.  Mouth/Throat: Oropharynx is clear and moist.  Eyes: Conjunctivae are normal. Pupils are equal, round, and reactive to light.  Neck:       Trach site clear of any mucous or plugging  Cardiovascular: Normal rate and regular rhythm.   Pulmonary/Chest: Effort normal and breath sounds normal.  Abdominal:       Mildly distended, but soft on palpation, nontender, active bowel sounds  Musculoskeletal: He exhibits no edema.  Neurological: He is alert.  Skin: Skin is warm and dry.          Assessment & Plan:

## 2011-06-22 NOTE — Assessment & Plan Note (Signed)
Reviewed the medications with patient. Blood pressure well controlled on spironolactone, Lasix. Discontinued propanolol. Followup in 3-4 months.

## 2011-10-30 ENCOUNTER — Encounter (HOSPITAL_COMMUNITY): Payer: Self-pay | Admitting: *Deleted

## 2011-10-30 ENCOUNTER — Emergency Department (HOSPITAL_COMMUNITY)
Admission: EM | Admit: 2011-10-30 | Discharge: 2011-10-30 | Disposition: A | Discharge status: Home / Self Care | Payer: PRIVATE HEALTH INSURANCE | Attending: Emergency Medicine | Admitting: Emergency Medicine

## 2011-10-30 DIAGNOSIS — I10 Essential (primary) hypertension: Secondary | ICD-10-CM | POA: Insufficient documentation

## 2011-10-30 DIAGNOSIS — Z430 Encounter for attention to tracheostomy: Secondary | ICD-10-CM | POA: Insufficient documentation

## 2011-10-30 DIAGNOSIS — Z87891 Personal history of nicotine dependence: Secondary | ICD-10-CM | POA: Insufficient documentation

## 2011-10-30 DIAGNOSIS — C140 Malignant neoplasm of pharynx, unspecified: Secondary | ICD-10-CM | POA: Insufficient documentation

## 2011-10-30 DIAGNOSIS — J9503 Malfunction of tracheostomy stoma: Secondary | ICD-10-CM | POA: Diagnosis not present

## 2011-10-30 HISTORY — DX: Unspecified cirrhosis of liver: K74.60

## 2011-10-30 NOTE — ED Provider Notes (Signed)
History     CSN: [id number]  Arrival date & time 10/30/11  1615   First MD Initiated Contact with Patient 10/30/11 1722      Chief Complaint  Patient presents with  . Trach Stoma Irritation   . Needs trach suctioned     (Consider location/radiation/quality/duration/timing/severity/associated sxs/prior treatment) The history is provided by the patient and a relative.   patient here with closing tracheostomy site over the past 3 years which is beginning more pronounced over the past few weeks. Denies any dyspnea. No fever or increased secretions from the tracheostomy site. Denies any distress. Has not had followup with ENT. He is receiving cancer treatment currently with radiation. Nothing makes her symptoms better worse. No medications used prior to arrival  Past Medical History  Diagnosis Date  . Hypertension   . Throat cancer   . Cirrhosis of liver     Past Surgical History  Procedure Date  . Tracheal surgery   . Tracheostomy     No family history on file.  History  Substance Use Topics  . Smoking status: Former Smoker -- 0.2 packs/day for 50 years    Types: Cigarettes  . Smokeless tobacco: Never Used  . Alcohol Use: Yes     "rarely"      Review of Systems  All other systems reviewed and are negative.    Allergies  Vicodin  Home Medications   Current Outpatient Rx  Name Route Sig Dispense Refill  . ACETAMINOPHEN ER 650 MG PO TBCR Oral Take 650 mg by mouth every 8 (eight) hours as needed. For pain     . ASPIRIN EC 81 MG PO TBEC Oral Take 81 mg by mouth daily.    . MEGESTROL ACETATE 400 MG/10ML PO SUSP Oral Take 10 mLs (400 mg total) by mouth daily. Dispense enough for one month 240 mL 5  . SPIRONOLACTONE 50 MG PO TABS Oral Take 50 mg by mouth daily.    . FUROSEMIDE 20 MG PO TABS Oral Take 20 mg by mouth 2 (two) times daily.    Marland Kitchen LEVOTHYROXINE SODIUM 125 MCG PO CAPS Oral Take 1 capsule (125 mcg total) by mouth daily. 90 capsule 3  . POTASSIUM CHLORIDE  CRYS ER 20 MEQ PO TBCR Oral Take 20 mEq by mouth daily.        BP 154/94  Pulse 100  Temp 99.2 F (37.3 C) (Oral)  Resp 20  SpO2 98%  Physical Exam  Nursing note and vitals reviewed. Constitutional: He is oriented to person, place, and time. He appears well-developed and well-nourished.  Non-toxic appearance. No distress.  HENT:  Head: Normocephalic and atraumatic.  Eyes: Conjunctivae, EOM and lids are normal. Pupils are equal, round, and reactive to light.  Neck: Normal range of motion. Neck supple. No tracheal deviation present. No mass present.       Tracheostomy site with good granulation tissue and healing. No discharge or erythema.  Cardiovascular: Normal rate, regular rhythm and normal heart sounds.  Exam reveals no gallop.   No murmur heard. Pulmonary/Chest: Effort normal and breath sounds normal. No stridor. No respiratory distress. He has no decreased breath sounds. He has no wheezes. He has no rhonchi. He has no rales.  Abdominal: Soft. Normal appearance and bowel sounds are normal. He exhibits no distension. There is no tenderness. There is no rebound and no CVA tenderness.  Musculoskeletal: Normal range of motion. He exhibits no edema and no tenderness.  Neurological: He is alert and oriented to  person, place, and time. He has normal strength. No cranial nerve deficit or sensory deficit. GCS eye subscore is 4. GCS verbal subscore is 5. GCS motor subscore is 6.  Skin: Skin is warm and dry. No abrasion and no rash noted.  Psychiatric: He has a normal mood and affect. His speech is normal and behavior is normal.    ED Course  Procedures (including critical care time)  Labs Reviewed - No data to display No results found.   No diagnosis found.    MDM  Patient given referral to ENT on call and return instructions        Toy Baker, MD 10/30/11 1734

## 2011-10-30 NOTE — ED Notes (Signed)
Respiratory notified of pt request. Upon assessment suction not needed. Humidified air applied with improvement of symptoms. Pt educated about closing of trach and need for humidified air.

## 2011-10-30 NOTE — ED Notes (Signed)
Pt reports irritation to trach and "hole getting smaller." Trach placed at [facility] 3 years ago, pt receives radiation at [facility] center. C/o increased thick, yellow secretions. Requests trach suctioning.

## 2011-11-09 ENCOUNTER — Encounter: Payer: Self-pay | Admitting: Family Medicine

## 2011-11-09 ENCOUNTER — Telehealth: Payer: Self-pay | Admitting: *Deleted

## 2011-11-09 ENCOUNTER — Ambulatory Visit (INDEPENDENT_AMBULATORY_CARE_PROVIDER_SITE_OTHER): Payer: Medicare Other | Admitting: Family Medicine

## 2011-11-09 VITALS — BP 124/81 | HR 108 | Temp 98.6°F | Wt 123.9 lb

## 2011-11-09 DIAGNOSIS — J9503 Malfunction of tracheostomy stoma: Secondary | ICD-10-CM | POA: Diagnosis not present

## 2011-11-09 DIAGNOSIS — R0989 Other specified symptoms and signs involving the circulatory and respiratory systems: Secondary | ICD-10-CM

## 2011-11-09 DIAGNOSIS — R0600 Dyspnea, unspecified: Secondary | ICD-10-CM

## 2011-11-09 MED ORDER — IBUPROFEN 600 MG PO TABS: 600.0000 mg | ORAL_TABLET | Freq: Three times a day (TID) | ORAL | Status: AC | PRN

## 2011-11-09 MED ORDER — MEGESTROL ACETATE 400 MG/10ML PO SUSP: 400.0000 mg | Freq: Every day | ORAL | Status: DC

## 2011-11-09 MED ORDER — MUPIROCIN 2 % EX OINT: TOPICAL_OINTMENT | Freq: Three times a day (TID) | CUTANEOUS | Status: AC

## 2011-11-09 NOTE — Patient Instructions (Addendum)
It was great to see you today.  I ordered a referral to ENT. Please call Dr. Jeannine Boga and ask to be seen for follow up appointment. If you develop worsening shortness of breath, increased secretions, please go to [facility] ER. We will call you with time and date of ENT appointment. In the meantime, apply topical antibiotic and take Ibuprofen every 8 hours as needed for pain and swelling. Return to clinic as needed.

## 2011-11-09 NOTE — Telephone Encounter (Signed)
Called and spoke with Ms. [name] concerning the ENT referral that Dr.de la Cruz placed for him. I told her that I had spoken to the referral person @ [facility] and she informed me that it is in the best interest of the pt to be seen by the Dr that did his tracheotomy. That is not to say that they won't see him however, they will have to get the notes from our Dr and the Dr that did the trach.   I asked her if she remembered the Dr that performed the surgery. She told me that it was Dr. [place] Wallin at [facility] [phone]. I told her that I would contact his office and see if I can get him in with him instead and I will give her a call back once I obtain the needed information. She agreed to this.Loralee Pacas [place]

## 2011-11-10 ENCOUNTER — Encounter: Payer: Self-pay | Admitting: Family Medicine

## 2011-11-10 DIAGNOSIS — R0600 Dyspnea, unspecified: Secondary | ICD-10-CM | POA: Insufficient documentation

## 2011-11-10 NOTE — Progress Notes (Signed)
  Subjective:    [name], male    DOB: [birth year], 61 y.o.   MRN: [id number]  HPI  Patient presents to clinic for concerns of trach stoma closing up.  Patient was seen in ED for this and was told by ED physician that his stoma is "supposed to close, and he will start breathing through his nose and mouth again."  Patient and family member are concerned because patient was told by his physician at [facility] that patient will not be able to breathe through his nose, only through his stoma.  Patient has had issues with mucous congestion of his stoma in the past, but he was able to control this with suctioning at home.  However, he says that because his stoma is narrowing, he can no longer use suction to break up the mucous, which leads to dyspnea.  Currently in clinic, patient is in no respiratory distress.  Family member will call Dr. Jeannine Boga at [facility] for clarification of stoma closure.  They would also like a referral to ENT as recommended per ED physician.  Denies chest pain, nausea/vomiting, fever, or chills.  Weight gain - improving with Megace.  Review of Systems  Per HPI    Objective:   Physical Exam  Constitutional: No distress.  Neck:       Stoma is patent; skin is dry, cracked, erythematous surrounding stoma; no active bleeding or drainage  Pulmonary/Chest: Effort normal. No respiratory distress. He has no wheezes. He has no rales.       Inspiratory stridor  Abdominal: There is no tenderness. There is no rebound and no guarding.       Mild distension, but soft on palpation  Skin:       See Neck exam           Assessment & Plan:

## 2011-11-10 NOTE — Assessment & Plan Note (Signed)
Dyspnea secondary to irritation of trach stoma. For dry skin, rash - gave Rx for Bactroban and advised patient to take NSAIDs to decrease inflammation. For SOB, advised patient to use supplemental O2 PRN. Referral placed for ENT to address stoma as recommended per ED physician. Also encouraged patient's family member to call Dr. Jeannine Boga (who performed procedure) with questions about stoma.

## 2011-11-13 ENCOUNTER — Telehealth: Payer: Self-pay | Admitting: *Deleted

## 2011-11-13 NOTE — Telephone Encounter (Signed)
Told.

## 2011-11-13 NOTE — Telephone Encounter (Signed)
Calling about ENT - states that his throat is getting sore - wants to know if we have heard anything

## 2011-11-22 NOTE — Telephone Encounter (Signed)
Spoke with triage nurse and told her that pt was having issues with his trach and would need to be seen ASAP she stated that Dr. Eather Colas no longer accepts [facility] pt and that he would have to be seen by someone else. I also told her that I spoke with Mr. [place] relative and she informed me that pt stated that it felt like it was something stuck in it and that he had been picking at it which was making it difficult for him to breath. I told her that I told Mr. Matsuo relative [phone]) to take him immediately to [facility] ED to be seen. Boneta Lucks agreed and told me that if he was seen thru their ED that they would make a follow up appt for him prior to him leaving.Loralee Pacas [place]

## 2011-11-23 DIAGNOSIS — C321 Malignant neoplasm of supraglottis: Secondary | ICD-10-CM | POA: Insufficient documentation

## 2011-11-24 ENCOUNTER — Ambulatory Visit: Payer: Medicare Other | Admitting: Family Medicine

## 2011-12-24 ENCOUNTER — Inpatient Hospital Stay (HOSPITAL_COMMUNITY)
Admission: EM | Admit: 2011-12-24 | Discharge: 2011-12-29 | DRG: 286 | Disposition: A | Discharge status: Home / Self Care | Payer: PRIVATE HEALTH INSURANCE | Attending: Family Medicine | Admitting: Family Medicine

## 2011-12-24 ENCOUNTER — Emergency Department (HOSPITAL_COMMUNITY): Payer: PRIVATE HEALTH INSURANCE

## 2011-12-24 ENCOUNTER — Encounter (HOSPITAL_COMMUNITY): Payer: Self-pay | Admitting: Emergency Medicine

## 2011-12-24 DIAGNOSIS — I428 Other cardiomyopathies: Secondary | ICD-10-CM | POA: Diagnosis present

## 2011-12-24 DIAGNOSIS — E8770 Fluid overload, unspecified: Secondary | ICD-10-CM

## 2011-12-24 DIAGNOSIS — J4489 Other specified chronic obstructive pulmonary disease: Secondary | ICD-10-CM | POA: Diagnosis present

## 2011-12-24 DIAGNOSIS — R0902 Hypoxemia: Secondary | ICD-10-CM

## 2011-12-24 DIAGNOSIS — Z9119 Patient's noncompliance with other medical treatment and regimen: Secondary | ICD-10-CM

## 2011-12-24 DIAGNOSIS — Z930 Tracheostomy status: Secondary | ICD-10-CM

## 2011-12-24 DIAGNOSIS — Z91199 Patient's noncompliance with other medical treatment and regimen due to unspecified reason: Secondary | ICD-10-CM

## 2011-12-24 DIAGNOSIS — E039 Hypothyroidism, unspecified: Secondary | ICD-10-CM | POA: Diagnosis present

## 2011-12-24 DIAGNOSIS — J9620 Acute and chronic respiratory failure, unspecified whether with hypoxia or hypercapnia: Secondary | ICD-10-CM

## 2011-12-24 DIAGNOSIS — N289 Disorder of kidney and ureter, unspecified: Secondary | ICD-10-CM | POA: Diagnosis not present

## 2011-12-24 DIAGNOSIS — Z87891 Personal history of nicotine dependence: Secondary | ICD-10-CM

## 2011-12-24 DIAGNOSIS — I5023 Acute on chronic systolic (congestive) heart failure: Secondary | ICD-10-CM

## 2011-12-24 DIAGNOSIS — R0600 Dyspnea, unspecified: Secondary | ICD-10-CM

## 2011-12-24 DIAGNOSIS — K7030 Alcoholic cirrhosis of liver without ascites: Secondary | ICD-10-CM | POA: Diagnosis present

## 2011-12-24 DIAGNOSIS — J449 Chronic obstructive pulmonary disease, unspecified: Secondary | ICD-10-CM | POA: Diagnosis present

## 2011-12-24 DIAGNOSIS — I5021 Acute systolic (congestive) heart failure: Secondary | ICD-10-CM

## 2011-12-24 DIAGNOSIS — Z8521 Personal history of malignant neoplasm of larynx: Secondary | ICD-10-CM

## 2011-12-24 DIAGNOSIS — I509 Heart failure, unspecified: Secondary | ICD-10-CM | POA: Diagnosis present

## 2011-12-24 DIAGNOSIS — I10 Essential (primary) hypertension: Secondary | ICD-10-CM | POA: Diagnosis present

## 2011-12-24 DIAGNOSIS — M129 Arthropathy, unspecified: Secondary | ICD-10-CM | POA: Diagnosis present

## 2011-12-24 HISTORY — DX: Unspecified osteoarthritis, unspecified site: M19.90

## 2011-12-24 LAB — CBC
HCT: 32.8 % — ABNORMAL LOW (ref 39.0–52.0)
MCV: 91.4 fL (ref 78.0–100.0)
RBC: 3.59 MIL/uL — ABNORMAL LOW (ref 4.22–5.81)
RDW: 17.3 % — ABNORMAL HIGH (ref 11.5–15.5)
WBC: 7.3 10*3/uL (ref 4.0–10.5)

## 2011-12-24 LAB — BASIC METABOLIC PANEL
CO2: 22 mEq/L (ref 19–32)
GFR calc non Af Amer: 89 mL/min — ABNORMAL LOW (ref >90)
Glucose, Bld: 94 mg/dL (ref 70–99)
Potassium: 4.2 mEq/L (ref 3.5–5.1)
Sodium: 137 mEq/L (ref 135–145)

## 2011-12-24 LAB — PRO B NATRIURETIC PEPTIDE: Pro B Natriuretic peptide (BNP): 13789 pg/mL — ABNORMAL HIGH (ref 0–125)

## 2011-12-24 LAB — CBC WITH DIFFERENTIAL/PLATELET
Basophils Absolute: 0 10*3/uL (ref 0.0–0.1)
Eosinophils Absolute: 0.3 10*3/uL (ref 0.0–0.7)
Lymphocytes Relative: 25 % (ref 12–46)
Lymphs Abs: 1.9 10*3/uL (ref 0.7–4.0)
Neutrophils Relative %: 60 % (ref 43–77)
Platelets: 271 10*3/uL (ref 150–400)
RBC: 3.7 MIL/uL — ABNORMAL LOW (ref 4.22–5.81)
RDW: 17.8 % — ABNORMAL HIGH (ref 11.5–15.5)
WBC: 7.6 10*3/uL (ref 4.0–10.5)

## 2011-12-24 LAB — TROPONIN I
Troponin I: <0.3 ng/mL (ref <0.30)
Troponin I: <0.3 ng/mL (ref <0.30)

## 2011-12-24 LAB — CREATININE, SERUM
GFR calc Af Amer: >90 mL/min (ref >90)
GFR calc non Af Amer: 79 mL/min — ABNORMAL LOW (ref >90)

## 2011-12-24 LAB — TSH: TSH: 84.299 u[IU]/mL — ABNORMAL HIGH (ref 0.350–4.500)

## 2011-12-24 MED ORDER — ASPIRIN EC 81 MG PO TBEC
81.0000 mg | DELAYED_RELEASE_TABLET | Freq: Every day | ORAL | Status: DC
  Administered 2011-12-25 – 2011-12-29 (×4): 81 mg via ORAL
  Filled 2011-12-24 (×6): qty 1

## 2011-12-24 MED ORDER — LEVOTHYROXINE SODIUM 125 MCG PO TABS
125.0000 ug | ORAL_TABLET | Freq: Every day | ORAL | Status: DC
  Administered 2011-12-25 – 2011-12-26 (×2): 125 ug via ORAL
  Filled 2011-12-24 (×4): qty 1

## 2011-12-24 MED ORDER — LISINOPRIL 10 MG PO TABS
10.0000 mg | ORAL_TABLET | Freq: Every day | ORAL | Status: DC
  Administered 2011-12-24 – 2011-12-25 (×2): 10 mg via ORAL
  Filled 2011-12-24 (×2): qty 1

## 2011-12-24 MED ORDER — FUROSEMIDE 10 MG/ML IJ SOLN
40.0000 mg | Freq: Once | INTRAMUSCULAR | Status: AC
  Administered 2011-12-24: 40 mg via INTRAVENOUS
  Filled 2011-12-24: qty 4

## 2011-12-24 MED ORDER — SODIUM CHLORIDE 0.9 % IJ SOLN
3.0000 mL | Freq: Two times a day (BID) | INTRAMUSCULAR | Status: DC
  Administered 2011-12-25 – 2011-12-29 (×5): 3 mL via INTRAVENOUS

## 2011-12-24 MED ORDER — NITROGLYCERIN 0.4 MG SL SUBL: 0.4000 mg | SUBLINGUAL_TABLET | Freq: Once | SUBLINGUAL | Status: DC

## 2011-12-24 MED ORDER — ONDANSETRON HCL 4 MG PO TABS: 4.0000 mg | ORAL_TABLET | Freq: Four times a day (QID) | ORAL | Status: DC | PRN

## 2011-12-24 MED ORDER — HEPARIN SODIUM (PORCINE) 5000 UNIT/ML IJ SOLN
5000.0000 [IU] | Freq: Three times a day (TID) | INTRAMUSCULAR | Status: DC
  Administered 2011-12-24 – 2011-12-28 (×12): 5000 [IU] via SUBCUTANEOUS
  Filled 2011-12-24 (×19): qty 1

## 2011-12-24 MED ORDER — SODIUM CHLORIDE 0.9 % IV SOLN: 250.0000 mL | INTRAVENOUS | Status: DC | PRN

## 2011-12-24 MED ORDER — SPIRONOLACTONE 50 MG PO TABS
50.0000 mg | ORAL_TABLET | Freq: Every day | ORAL | Status: DC
  Administered 2011-12-24 – 2011-12-26 (×3): 50 mg via ORAL
  Filled 2011-12-24 (×3): qty 1

## 2011-12-24 MED ORDER — ASPIRIN EC 81 MG PO TBEC
81.0000 mg | DELAYED_RELEASE_TABLET | Freq: Every day | ORAL | Status: DC
  Filled 2011-12-24: qty 1

## 2011-12-24 MED ORDER — SODIUM CHLORIDE 0.9 % IJ SOLN
3.0000 mL | INTRAMUSCULAR | Status: DC | PRN
  Administered 2011-12-28: 3 mL via INTRAVENOUS

## 2011-12-24 MED ORDER — LEVOTHYROXINE SODIUM 125 MCG PO CAPS: 1.0000 | ORAL_CAPSULE | Freq: Every day | ORAL | Status: DC

## 2011-12-24 MED ORDER — FUROSEMIDE 10 MG/ML IJ SOLN
40.0000 mg | Freq: Two times a day (BID) | INTRAMUSCULAR | Status: DC
  Administered 2011-12-25 – 2011-12-26 (×3): 40 mg via INTRAVENOUS
  Filled 2011-12-24 (×4): qty 4

## 2011-12-24 MED ORDER — ASPIRIN 325 MG PO TABS
325.0000 mg | ORAL_TABLET | Freq: Once | ORAL | Status: AC
  Administered 2011-12-24: 325 mg via ORAL
  Filled 2011-12-24: qty 1

## 2011-12-24 MED ORDER — MEGESTROL ACETATE 400 MG/10ML PO SUSP
400.0000 mg | Freq: Every day | ORAL | Status: DC
  Administered 2011-12-24 – 2011-12-25 (×2): 400 mg via ORAL
  Filled 2011-12-24 (×2): qty 10

## 2011-12-24 MED ORDER — ONDANSETRON HCL 4 MG/2ML IJ SOLN: 4.0000 mg | Freq: Four times a day (QID) | INTRAMUSCULAR | Status: DC | PRN

## 2011-12-24 MED ORDER — NITROGLYCERIN 0.4 MG SL SUBL: 0.4000 mg | SUBLINGUAL_TABLET | SUBLINGUAL | Status: DC | PRN

## 2011-12-24 MED ORDER — SODIUM CHLORIDE 0.9 % IJ SOLN
3.0000 mL | Freq: Two times a day (BID) | INTRAMUSCULAR | Status: DC
  Administered 2011-12-24 – 2011-12-25 (×3): 3 mL via INTRAVENOUS

## 2011-12-24 NOTE — H&P (Signed)
Family Medicine Teaching [facility] Admission History and Physical Service Pager: [phone]  Patient name: [name] Medical record number: [id number] Date of birth: 02/25/1951 Age: 61 y.o. Gender: male  Primary Care Provider: DE LA CRUZ,IVY, DO  Chief Complaint: SOB, left sided chest pain  Assessment and Plan: [name] is a 61 y.o. year old male with a PMH of Laryngeal cancer s/p laryngectomy and tracheostomy, HTN, hypothyroidism, and cirrhosis of the liver who presents with SOB and left-sided chest pain x 2 days.   Chest xray and BNP consistent with acute CHF.  # SOB - Likely secondary to new onset CHF.  Chest xray revealed pulmonary vascular congestion and perihilar edema and pro-BNP was elevated at 13789 (last prior was 519.7 in 2012) - Will admit to Telemetry, FMTS, Attending Dr. Mauricio Po - Will obtain Echo, Daily weights, Strict I/O's. - O2 via Trach Collar. Respiratory/nursing can suction as needed. - Patient received 1 dose of Lasix 40 mg IV in the ED.  Will continue diuresis with additional 40 mg IV now.  Also starting patient on ACEI. - May consider Heart Failure team consult during admission.    # Chest pain - Appears non cardiac in origin.  However, given presentation will rule out ACS. - Initial Troponin I was negative.  EKG revealed no signs of ischemia - Will cycle Troponin I x 2. - EKG in the am.  - Patient already received ASA in the [facility] ED.  Will continue ASA 81 mg starting tomorrow.  # HTN - Blood pressure elevated on admission - 154/108.    - Patient is currently on Lasix and Spirinolactone at home. - Will add Lisinopril 10 mg   # Alcoholic Liver Cirrhosis - Will continue Spironolactone. Lasix as indicated above. - Will check CMP to evaluate LFT's.  - Will monitor closely during admission   # Hypothyroidism - Last TSH was 16.327 (01/2011) - Will recheck.  Will continue home Synthroid 125 mcg and titrate up based on TSH.  FEN/GI: Heart  healthy diet. Prophylaxis: Heparin SQ Disposition: Pending clinical improvement Code Status: Full Code  History of Present Illness:  [name] is a 61 y.o. year old male with a PMH of Laryngeal cancer s/p laryngectomy and tracheostomy, HTN, and hypothyroidism who presents with SOB and left-sided chest pain x 2 days.    Patient reports worsening SOB over the past 2 days.  He also reports left-sided chest pain, located over the lower ribs.  He states the chest pain occurs with cough and inspiration.  Patient denies any other complaints at this time.  In the [facility] ED, patient was hypoxemic (83 % O2 on room air) and was subsequently placed on 6 L trach collar.  Chest xray revealed pulmonary vascular congestion and perihilar edema and BNP was elevated at 13789.  Of note, history is limited due to patient's tracheostomy.  No family present at time of admission.  ROS: Reports SOB worse at night, chest pain, cough  Denies fevers, chills, N/V, abdominal pain, abdominal swelling, lower extremity edema.    Patient Active Problem List  Diagnosis  . NEOPLASM, MALIGNANT, LARYNX  . ESSENTIAL HYPERTENSION, BENIGN  . HYPERTENSION  . PULMONARY NODULE  . Hypothyroidism  . Allergic rhinitis  . Dyspnea   Past Medical History: Past Medical History  Diagnosis Date  . Hypertension   . Throat cancer   . Cirrhosis of liver   . Shortness of breath   . Arthritis    Past Surgical History: Past  Surgical History  Procedure Date  . Tracheal surgery   . Tracheostomy    Social History: History  Substance Use Topics  . Smoking status: Former Smoker -- 0.2 packs/day for 50 years    Types: Cigarettes  . Smokeless tobacco: Never Used  . Alcohol Use: Yes     "rarely"   Family History: Father - Cancer Brother - Cancer Mother - Stroke  Allergies: Allergies  Allergen Reactions  . Vicodin (Hydrocodone-Acetaminophen) Rash   Review Of Systems: Per HPI.   Physical Exam: BP 167/114  Pulse  109  Resp 28  SpO2 99%  Exam: General: awake, alert. NAD. HEENT: NCAT. No nasal discharge noted. Cardiovascular: Tachycardic. No murmurs, rubs, or gallops auscultated. Respiratory: Coarse breath sounds and diffuse rhonchi throughout all lung fields. Abdomen: soft, nontender, nondistended. No organomegaly appreciated. Extremities: warm, well perfused.  No lower extremity edema noted. Skin: warm, dry, intact.  Neuro: AO x 3. No focal deficits.   Labs and Imaging: CBC BMET   Lab 12/24/11 0500  WBC 7.6  HGB 11.4*  HCT 34.4*  PLT 271    Lab 12/24/11 0500  NA 137  K 4.2  CL 104  CO2 22  BUN 14  CREATININE 0.97  GLUCOSE 94  CALCIUM 9.0     BNP (last 3 results)  Basename 12/24/11 0709  PROBNP 13789.0*   Dg Chest Portable 1 View 12/24/2011 IMPRESSION: Cardiac enlargement with pulmonary vascular congestion and perihilar edema.  Possible small bilateral pleural effusions.  EKG - Sinus tachycardia, probable LAE. Prolonged QT interval.  Everlene Other, DO 12/24/2011, 11:48 AM   PGY-3 Addendum  I have seen and examined patient along with Dr. Adriana Simas and agree with exam findings, assessment and plan as outlined above.  Appears to be new onset CHF with exam findings, cxr findings and elevated BNP.  Will assess response to loop diuretic and titrate as needed.  BP elevated, will go ahead and start ACE-I.  Can plan to add on BB as needed if can tolerate.  Doubt ACS as cause but will cycle enzymes to be sure.   ECHO ordered to assess LV function and assess for structural abnormalities.  Respiratory following along for trach/stoma care.   Everrett Coombe, DO

## 2011-12-24 NOTE — Progress Notes (Signed)
PT resting comfortably at this time time. No distress. No sx needed. Pt has had stoma for 4 years. Pt states he does wear O2 at home but is unsure of the percentage.

## 2011-12-24 NOTE — ED Notes (Signed)
Call placed to [facility] 

## 2011-12-24 NOTE — ED Notes (Signed)
Attempted to call report, was informed Tai, Consulting civil engineer will be taking report but in with another pt, this RN to call back in 5 minutes.

## 2011-12-24 NOTE — Progress Notes (Signed)
  Echocardiogram 2D Echocardiogram has been performed.  [name] 12/24/2011, 2:27 PM

## 2011-12-24 NOTE — ED Provider Notes (Signed)
History     CSN: [id number]  Arrival date & time 12/24/11  0430   First MD Initiated Contact with Patient 12/24/11 [phone]      Chief Complaint  Patient presents with  . Shortness of Breath    (Consider location/radiation/quality/duration/timing/severity/associated sxs/prior treatment) The history is provided by the patient and a relative.   patient reports developing some difficulty breathing today as well as left-sided chest pain.  He reports his left-sided chest pain is worse when he coughs.  The patient has a history of throat cancer per his daughter-in-law and his had his trach for approximately 4 years ago.  As reported that he is trach-dependent per the daughter-in-law and that his stoma was recently dilated to help him with his breathing.  He was discharged from [facility] after a bout with pneumonia several weeks ago.  Patient denies fevers or chills.  His had no abdominal pain.  He denies nausea or vomiting.  He denies unilateral leg swelling.  He currently agrees to home on room air through his stoma.  He apparently has oxygen at home when necessary  Past Medical History  Diagnosis Date  . Hypertension   . Throat cancer   . Cirrhosis of liver     Past Surgical History  Procedure Date  . Tracheal surgery   . Tracheostomy     No family history on file.  History  Substance Use Topics  . Smoking status: Former Smoker -- 0.2 packs/day for 50 years    Types: Cigarettes  . Smokeless tobacco: Never Used  . Alcohol Use: Yes     "rarely"      Review of Systems  Respiratory: Positive for shortness of breath.   All other systems reviewed and are negative.    Allergies  Vicodin  Home Medications   Current Outpatient Rx  Name Route Sig Dispense Refill  . ACETAMINOPHEN ER 650 MG PO TBCR Oral Take 650 mg by mouth every 8 (eight) hours as needed. For pain     . ASPIRIN EC 81 MG PO TBEC Oral Take 81 mg by mouth daily.    . FUROSEMIDE 20 MG PO TABS  Oral Take 20 mg by mouth 2 (two) times daily.    Marland Kitchen LEVOTHYROXINE SODIUM 125 MCG PO CAPS Oral Take 1 capsule (125 mcg total) by mouth daily. 90 capsule 3  . MEGESTROL ACETATE 400 MG/10ML PO SUSP Oral Take 10 mLs (400 mg total) by mouth daily. Dispense enough for one month 240 mL 5  . POTASSIUM CHLORIDE CRYS ER 20 MEQ PO TBCR Oral Take 20 mEq by mouth daily.      Marland Kitchen SPIRONOLACTONE 50 MG PO TABS Oral Take 50 mg by mouth daily.      BP 155/115  Pulse 114  Resp 22  SpO2 96%  Physical Exam  Nursing note and vitals reviewed. Constitutional: He is oriented to person, place, and time. He appears well-developed and well-nourished.  HENT:  Head: Normocephalic and atraumatic.  Eyes: EOM are normal.  Neck: Normal range of motion.       Stoma is well-healed and normal in appearance.  Normal granulation tissue.  This is wide open and patent.  He is moving air freely through his stoma.  No gross secretions noted  Cardiovascular: Normal rate, regular rhythm, normal heart sounds and intact distal pulses.   Pulmonary/Chest: Effort normal and breath sounds normal. No respiratory distress. He has no wheezes. He has no rales.  Abdominal: Soft. He exhibits  no distension. There is no tenderness.  Musculoskeletal: Normal range of motion.  Neurological: He is alert and oriented to person, place, and time.  Skin: Skin is warm and dry.  Psychiatric: He has a normal mood and affect. Judgment normal.    ED Course  Procedures (including critical care time)   Date: 12/24/2011  Rate: 105  Rhythm: sinus tachycardia  QRS Axis: normal  Intervals: normal  ST/T Wave abnormalities: normal  Conduction Disutrbances: none  Narrative Interpretation:   Old EKG Reviewed: No significant changes noted     Labs Reviewed  CBC WITH DIFFERENTIAL - Abnormal; Notable for the following:    RBC 3.70 (*)     Hemoglobin 11.4 (*)     HCT 34.4 (*)     RDW 17.8 (*)     All other components within normal limits  BASIC  METABOLIC PANEL - Abnormal; Notable for the following:    GFR calc non Af Amer 89 (*)     All other components within normal limits  TROPONIN I   Dg Chest Portable 1 View  12/24/2011  *RADIOLOGY REPORT*  Clinical Data: Increasing shortness of breath.  Difficulty breathing.  PORTABLE CHEST - 1 VIEW  Comparison: 12/15/2010  Findings: Cardiac enlargement with increased pulmonary vascularity and perihilar infiltrates suggesting congestive failure and perihilar edema.  Probable small bilateral pleural effusions.  No pneumothorax.  Mediastinal contours appear intact.  Postoperative changes in the base of the neck.  IMPRESSION: Cardiac enlargement with pulmonary vascular congestion and perihilar edema.  Possible small bilateral pleural effusions.   Original Report Authenticated By: Marlon Pel, M.D.     I personally reviewed the imaging tests through PACS system  I reviewed available ER/hospitalization records thought the EMR   1. Hypoxia   2. Volume overload       MDM  The patient is slightly hypertensive.  He will see if aspirin and nitroglycerin now.  This may help with afterload reduction.  He has pulmonary vascular congestion and peripheral edema suggesting early congestive heart failure.  IV Lasix now as well.  This would be a new diagnosis of heart failure for the patient.  He is hypoxic his O2 sats on arrival were 83% on room air.  On 6 L trach collar over his stoma his sats) 94%.  The patient be admitted to the family practice Center and transferred to [facility].        Lyanne Co, MD 12/24/11 [phone]

## 2011-12-24 NOTE — ED Notes (Signed)
JYN:[id number]<AO> Expected date:<BR> Expected time:<BR> Means of arrival:<BR> Comments:<BR> Rm 24, Medic 140, 61 M, SOB

## 2011-12-24 NOTE — ED Notes (Signed)
[facility] here for transport. 

## 2011-12-24 NOTE — ED Notes (Addendum)
Per EMS , pt. Is from home who complaint of SOB starting yesterday , pt. Had tracheostomy tube and suctioned himself many times but no relief and decided to take trach tube out . Pt. SOB worsen. Alert and oriented x 4.  Denied of chest pain. Pt. Claimed of having trach tube for 2 months.

## 2011-12-24 NOTE — ED Notes (Signed)
Respiratory called, awaiting their arrival.

## 2011-12-24 NOTE — H&P (Signed)
FMTS Attending Admit Note Patient seen and examined by me, discussed with resident team and I agree with their assessment and plan as presented in note.  Patient appears comfortable after diuresis.  Agree with plan for 2D ECHO, monitoring I/O, weights.  Paula Compton, MD

## 2011-12-25 LAB — CBC
MCH: 31.4 pg (ref 26.0–34.0)
MCV: 90.7 fL (ref 78.0–100.0)
Platelets: 263 10*3/uL (ref 150–400)
RDW: 17.3 % — ABNORMAL HIGH (ref 11.5–15.5)
WBC: 6.4 10*3/uL (ref 4.0–10.5)

## 2011-12-25 LAB — COMPREHENSIVE METABOLIC PANEL
AST: 21 U/L (ref 0–37)
Albumin: 2.7 g/dL — ABNORMAL LOW (ref 3.5–5.2)
Alkaline Phosphatase: 73 U/L (ref 39–117)
BUN: 23 mg/dL (ref 6–23)
CO2: 21 mEq/L (ref 19–32)
Chloride: 104 mEq/L (ref 96–112)
Potassium: 3.5 mEq/L (ref 3.5–5.1)
Total Bilirubin: 0.9 mg/dL (ref 0.3–1.2)

## 2011-12-25 MED ORDER — LISINOPRIL 20 MG PO TABS
20.0000 mg | ORAL_TABLET | Freq: Every day | ORAL | Status: DC
  Administered 2011-12-26 – 2011-12-29 (×3): 20 mg via ORAL
  Filled 2011-12-25 (×4): qty 1

## 2011-12-25 NOTE — Progress Notes (Signed)
Spoke to MD regarding pt's request to remove  scabs  from his stoma. I explain to pt that I was not trained to do that procedure, and  that I would get the RT to assist. Pt was not very happy . He stated that at home he uses a Q-Tip to clean the stoma. Pt was not in any respiratory distress at this time. 02 Sat. 99% FI0 2 @ 40 % HR 89 . We increase FI02 to 60% and clear the stoma site. Pt was suction for small amount of thin white mucus. Pt was also inform that the MD was informed and someone will see him this AM. Pt mouth "ok" We will continue to monitor.

## 2011-12-25 NOTE — Progress Notes (Signed)
Family Medicine Teaching Service Daily Progress Note Service Page: [phone]  Patient Assessment: [name] is a 61 y.o. year old male with a PMH of Laryngeal cancer s/p laryngectomy and tracheostomy, HTN, hypothyroidism, and cirrhosis of the liver who presents with SOB and left-sided chest pain x 2 days. Chest xray and BNP consistent with acute CHF.   Subjective: Feels "so so" this morning.  Breathing has improved.  Wanting nurse to pick away scabs of his stoma, states he uses qtip at home for this.    Objective: Temp:  [98.5 F (36.9 C)-99.4 F (37.4 C)] 99 F (37.2 C) (10/21 0544) Pulse Rate:  [90-109] 104  (10/21 0544) Resp:  [20-28] 22  (10/21 0544) BP: (106-150)/(79-90) 148/84 mmHg (10/21 0941) SpO2:  [95 %-100 %] 95 % (10/21 0544) FiO2 (%):  [40 %-80 %] 40 % (10/21 0544) Weight:  [130 lb 9.6 oz (59.24 kg)] 130 lb 9.6 oz (59.24 kg) (10/21 0544) Exam: General: Sitting up in bed, no distress, trach collar in place Cardiovascular: RRR, no murmur, rub or gallop Respiratory: Coarse breath sounds, difficult to differentiate transmitted upper airway sounds.  He does have bibasilar crackles Abdomen: Soft, NT/ND. BS + Extremities: No edema.  I have reviewed the patient's medications, labs, imaging, and diagnostic testing.  Notable results are summarized below.  CBC BMET   Lab 12/25/11 0610 12/24/11 1236 12/24/11 0500  WBC 6.4 7.3 7.6  HGB 11.1* 11.4* 11.4*  HCT 32.0* 32.8* 34.4*  PLT 263 258 271    Lab 12/25/11 0610 12/24/11 1236 12/24/11 0500  NA 137 -- 137  K 3.5 -- 4.2  CL 104 -- 104  CO2 21 -- 22  BUN 23 -- 14  CREATININE 1.07 1.02 0.97  GLUCOSE 103* -- 94  CALCIUM 8.7 -- 9.0      Plan: # SOB - Likely secondary to new onset CHF. Chest xray revealed pulmonary vascular congestion and perihilar edema and pro-BNP was elevated at 13789 (last prior was 519.7 in 2012)  - ECHO with EF 30% and diffuse hypokinesis -Continue Daily weights, Strict I/O's.  - O2 via Trach  Collar. Respiratory/nursing can suction as needed.  - Patient received 1 dose of Lasix 40 mg IV in the ED. Will continue diuresis with additional 40 mg IV now.  -Continue ACE-I, cr stable - May consider Heart Failure team consult during admission.   # Chest pain - Appears non cardiac in origin. However, given presentation will rule out ACS.  - only when coughing, CE negative. -continue asa 81mg   # HTN  - Pressures remain elevated, - Patient is currently on Lasix and Spirinolactone at home.  - Increase lisinopril to 20mg   # Alcoholic Liver Cirrhosis  - Will continue Spironolactone. Lasix as indicated above.   - Will monitor closely during admission   # Hypothyroidism  - TSH pending -  Will continue home Synthroid 125 mcg and titrate up based on TSH.  FEN/GI: Heart healthy diet.  Prophylaxis: Heparin SQ  Disposition: Pending continued clinical improvement  Code Status: Full Code   Earlie Arciga, DO 12/25/2011, 11:01 AM

## 2011-12-25 NOTE — Clinical Documentation Improvement (Signed)
RESPIRATORY FAILURE DOCUMENTATION CLARIFICATION QUERY   THIS DOCUMENT IS NOT A PERMANENT PART OF THE MEDICAL RECORD  Please update your documentation within the medical record to reflect your response to this query.                                                                                     12/25/11  Dr. Ashley Royalty and/or Associates,  In a better effort to capture your patient's severity of illness, reflect appropriate length of stay and utilization of resources, a review of the patient medical record has revealed the following indicators:  Admitted with Heart Failure  "He is hypoxic his O2 sats on arrival were 83% on room air. On 6 L trach collar over his stoma his sats) 94%. The patient be admitted to the family practice Center and transferred to [facility]."  Lyanne Co, MD  12/24/11 [phone]  In the [facility] ED, patient was hypoxemic (83 % O2 on room air) and was subsequently placed on 6 L trach collar. Chest xray revealed pulmonary vascular congestion and perihilar edema and BNP was elevated at 13789. Respiratory: Coarse breath sounds and diffuse rhonchi throughout all lung fields. Everrett Coombe, DO 12/24/11  Heart Rate in ED  100's -110's  Resp Rate in ED   22-35  Fi02 titrated up to 60% to improve 02 Sat  12/24/11     PORTABLE CHEST - 1 VIEW  Comparison: 12/15/2010  Findings: Cardiac enlargement with increased pulmonary vascularity and perihilar infiltrates suggesting congestive failure and perihilar edema. Probable small bilateral pleural effusions. No pneumothorax. Mediastinal contours appear intact. Postoperative changes in the base of the neck.  IMPRESSION:  Cardiac enlargement with pulmonary vascular congestion and  perihilar edema. Possible small bilateral pleural effusions.  Original Report Authenticated By: Marlon Pel, M.D.   Based on your clinical judgment, please document in the progress  Notes and discharge summary if a condition below  provides greater specificity regarding the patient's respiratory status:   - Acute Respiratory Failure, improved   - Other Condition   - Unable to Clinically Determine   In responding to this query please exercise your independent judgment.    The fact that a query is asked, does not imply that any particular answer is desired or expected.   Reviewed: 12/26/11 - a on c resp failure documented in hf consult.  Mathis Dad RN  Thank You,  Jerral Ralph  RN BSN CCDS Certified Clinical Documentation Specialist: Cell   [phone]  Health Information Management [place]   TO RESPOND TO THE THIS QUERY, FOLLOW THE INSTRUCTIONS BELOW:  1. If needed, update documentation for the patient's encounter via the notes activity.  2. Access this query again and click edit on the In [employer].  3. After updating, or not, click F2 to complete all highlighted (required) fields concerning your review. Select "additional documentation in the medical record" OR "no additional documentation provided".  4. Click Sign note button.  5. The deficiency will fall out of your In Basket *Please let us know if you are not able to complete this workflow by phone or e-mail (listed below).

## 2011-12-25 NOTE — Progress Notes (Signed)
UR done. 

## 2011-12-25 NOTE — Progress Notes (Signed)
I discussed the care plan with Dr. Ashley Royalty and the [facility] team and agree with assessment and plan as documented in the progress note for today.  Chaye Misch A. Sheffield Slider, MD Family Medicine Teaching Service Attending  12/25/2011 6:47 PM

## 2011-12-25 NOTE — Progress Notes (Signed)
Pt has requested on several occassions tweezers to clean the upper (inside) of his stoma. I talked with pt about concerns for infection. He advised me that this is what he does at home. I obtained a suture removal kit which has sterile tweezers. I removed the scissors.

## 2011-12-26 ENCOUNTER — Inpatient Hospital Stay (HOSPITAL_COMMUNITY): Payer: PRIVATE HEALTH INSURANCE

## 2011-12-26 DIAGNOSIS — I5021 Acute systolic (congestive) heart failure: Principal | ICD-10-CM

## 2011-12-26 DIAGNOSIS — J9620 Acute and chronic respiratory failure, unspecified whether with hypoxia or hypercapnia: Secondary | ICD-10-CM | POA: Diagnosis not present

## 2011-12-26 DIAGNOSIS — R0989 Other specified symptoms and signs involving the circulatory and respiratory systems: Secondary | ICD-10-CM | POA: Diagnosis not present

## 2011-12-26 DIAGNOSIS — R0902 Hypoxemia: Secondary | ICD-10-CM | POA: Diagnosis not present

## 2011-12-26 DIAGNOSIS — R0609 Other forms of dyspnea: Secondary | ICD-10-CM

## 2011-12-26 LAB — BASIC METABOLIC PANEL
CO2: 24 mEq/L (ref 19–32)
Calcium: 9.2 mg/dL (ref 8.4–10.5)
Creatinine, Ser: 1.31 mg/dL (ref 0.50–1.35)
Glucose, Bld: 90 mg/dL (ref 70–99)

## 2011-12-26 MED ORDER — SPIRONOLACTONE 25 MG PO TABS
25.0000 mg | ORAL_TABLET | Freq: Every day | ORAL | Status: DC
  Administered 2011-12-27 – 2011-12-29 (×2): 25 mg via ORAL
  Filled 2011-12-26 (×3): qty 1

## 2011-12-26 MED ORDER — FUROSEMIDE 20 MG PO TABS
20.0000 mg | ORAL_TABLET | Freq: Every day | ORAL | Status: DC
  Administered 2011-12-27: 20 mg via ORAL
  Filled 2011-12-26 (×2): qty 1

## 2011-12-26 MED ORDER — DIGOXIN 125 MCG PO TABS
0.1250 mg | ORAL_TABLET | Freq: Every day | ORAL | Status: DC
  Administered 2011-12-26 – 2011-12-29 (×3): 0.125 mg via ORAL
  Filled 2011-12-26 (×4): qty 1

## 2011-12-26 MED ORDER — LEVOTHYROXINE SODIUM 150 MCG PO TABS
150.0000 ug | ORAL_TABLET | Freq: Every day | ORAL | Status: DC
  Administered 2011-12-27: 150 ug via ORAL
  Filled 2011-12-26 (×3): qty 1

## 2011-12-26 NOTE — Consult Note (Signed)
Advanced Heart Failure Team Consult Note  Referring Physician: Dr Ivin Booty Primary Physician: Dr Sondra Come Primary Cardiologist:  None Oncologist: Dr Sinclair Grooms [facility]  Reason for Consultation: New Onset Heart Failure  HPI:   Mr [name] is a 61 year old with a PMH of COPD, laryngeal cancer S/P laryngectomy (2009), HTN, hypothyroidism, cirrohsis (drinks 1/2 pint of liquor on weekends), and former tobacco (quit 2012).   Denies any h/o known heart disease. Says he had stress test 3 years ago. (Walked on treadmill). [place] Spaniel it was normal.  He presented to [facility] ED 10//20/13 with increased dyspnea and  L sided chest pain. Admission weight 132 pounds.  Pertinent labs included Pro BNP  13,789, CEs negative, and TSH 84.2. 12/24/11 EF 30% Not dilated. Diffuse hypokinesis. RV ok. Started on Lasix 40 mg IV bid. Negative 4.2 liters. Weight down 5 pounds. CR now going up 0.8 -. 1.3.  Lives with his 2 daughters and son. Able to perform ADLs. Able to walk independently. Uses 3 liters oxygen at home. Ran out of synthroid for about 2 months. Says that he can pay for his medications.   Denies Dyspnea/PND/Orthopnea/CP  Review of Systems: [y] = yes, [ ]  = no   Limited due to desaturations while mouthing the answers to questions.  (oxygen sats dropped to 64%)  General: Weight gain [ ] ; Weight loss [ ] ; Anorexia [ ] ; Fatigue [ ] ; Fever [ ] ; Chills [ ] ; Weakness [ ]   Cardiac: Chest pain/pressure [ ] ; Resting SOB [ ] ; Exertional SOB [ Y]; Orthopnea [ ] ; Pedal Edema [ ] ; Palpitations [ ] ; Syncope [ ] ; Presyncope [ ] ; Paroxysmal nocturnal dyspnea[ ]   Pulmonary: Cough [ Y]; Wheezing[ ] ; Hemoptysis[ ] ; Sputum [ ] ; Snoring [ ]   GI: Vomiting[ ] ; Dysphagia[ ] ; Melena[ ] ; Hematochezia [ ] ; Heartburn[ ] ; Abdominal pain [ ] ; Constipation [ ] ; Diarrhea [ ] ; BRBPR [ ]   GU: Hematuria[ ] ; Dysuria [ ] ; Nocturia[ ]   Vascular: Pain in legs with walking [ ] ; Pain in feet with lying flat [ ] ; Non-healing sores [ ] ; Stroke [ ] ; TIA [ ] ; Slurred  speech [ ] ;  Neuro: Headaches[ ] ; Vertigo[ ] ; Seizures[ ] ; Paresthesias[ ] ;Blurred vision [ ] ; Diplopia [ ] ; Vision changes [ ]   Ortho/Skin: Arthritis [ ] ; Joint pain [ ] ; Muscle pain [ ] ; Joint swelling [ ] ; Back Pain [ ] ; Rash [ ]   Psych: Depression[ ] ; Anxiety[ ]   Heme: Bleeding problems [ ] ; Clotting disorders [ ] ; Anemia [ ]   Endocrine: Diabetes [ ] ; Thyroid dysfunction[ Y]  Home Medications Prior to Admission medications   Medication Sig Start Date End Date Taking? Authorizing Provider  acetaminophen (TYLENOL) 650 MG CR tablet Take 650 mg by mouth every 8 (eight) hours as needed. For pain    Yes Historical Provider, MD  aspirin EC 81 MG tablet Take 81 mg by mouth daily.   Yes Historical Provider, MD  furosemide (LASIX) 20 MG tablet Take 20 mg by mouth 2 (two) times daily.   Yes Historical Provider, MD  Levothyroxine Sodium 125 MCG CAPS Take 1 capsule (125 mcg total) by mouth daily. 01/18/11  Yes Ivy de Lawson Radar, DO  megestrol (MEGACE) 400 MG/10ML suspension Take 10 mLs (400 mg total) by mouth daily. Dispense enough for one month 11/09/11  Yes Ivy de La Cruz, DO  potassium chloride SA (KLOR-CON M20) 20 MEQ tablet Take 20 mEq by mouth daily.     Yes Historical Provider, MD  spironolactone (ALDACTONE) 50 MG  tablet Take 50 mg by mouth daily.   Yes Historical Provider, MD    Past Medical History: Past Medical History  Diagnosis Date  . Hypertension   . Throat cancer   . Cirrhosis of liver   . Shortness of breath   . Arthritis     Past Surgical History: Past Surgical History  Procedure Date  . Tracheal surgery   . Tracheostomy     Family History: No family history on file.  Social History: History   Social History  . Marital Status: Married    Spouse Name: N/A    Number of Children: N/A  . Years of Education: N/A   Social History Main Topics  . Smoking status: Former Smoker -- 0.2 packs/day for 50 years    Types: Cigarettes  . Smokeless tobacco: Never Used  . Alcohol  Use: Yes     "rarely"  . Drug Use: No  . Sexually Active: None   Other Topics Concern  . None   Social History Narrative  . None    Allergies:  Allergies  Allergen Reactions  . Vicodin (Hydrocodone-Acetaminophen) Rash    Objective:    Vital Signs:   Temp:  [98.5 F (36.9 C)-98.9 F (37.2 C)] 98.7 F (37.1 C) (10/22 0948) Pulse Rate:  [88-110] 98  (10/22 0948) Resp:  [18-21] 20  (10/22 0948) BP: (108-138)/(70-108) 108/70 mmHg (10/22 0948) SpO2:  [93 %-100 %] 97 % (10/22 0900) FiO2 (%):  [28 %-40 %] 28 % (10/22 0900) Weight:  [57.7 kg (127 lb 3.3 oz)] 57.7 kg (127 lb 3.3 oz) (10/22 0439) Last BM Date: 12/24/11  Weight change: Filed Weights   12/24/11 1100 12/25/11 0544 12/26/11 0439  Weight: 60.2 kg (132 lb 11.5 oz) 59.24 kg (130 lb 9.6 oz) 57.7 kg (127 lb 3.3 oz)    Intake/Output:   Intake/Output Summary (Last 24 hours) at 12/26/11 1027 Last data filed at 12/26/11 0858  Gross per 24 hour  Intake   1020 ml  Output   3750 ml  Net  -2730 ml     Physical Exam: General: Chronically ill appearing. No resp difficulty HEENT: normal Neck: supple. JVP hard to see - does not look overly elevated. Carotids 2+ bilat; no bruits. No lymphadenopathy or thryomegaly appreciated. Tracheostomy  Cor: PMI nondisplaced. Regular rate & rhythm. +s3 Lungs: RML RLL LLL Rhonchi Abdomen: soft, nontender, mildly distended. No hepatosplenomegaly. No bruits or masses. Good bowel sounds. Extremities: no cyanosis, clubbing, rash, edema Neuro: alert & orientedx3, cranial nerves grossly intact. moves all 4 extremities w/o difficulty. Affect pleasant  Telemetry: SR-Stach 85-100  Labs: Basic Metabolic Panel:  Lab 12/26/11 8469 12/25/11 0610 12/24/11 1236 12/24/11 0500  NA 136 137 -- 137  K 3.7 3.5 -- 4.2  CL 98 104 -- 104  CO2 24 21 -- 22  GLUCOSE 90 103* -- 94  BUN 29* 23 -- 14  CREATININE 1.31 1.07 1.02 0.97  CALCIUM 9.2 8.7 -- 9.0  MG -- -- -- --  PHOS -- -- -- --    Liver  Function Tests:  Lab 12/25/11 0610  AST 21  ALT 17  ALKPHOS 73  BILITOT 0.9  PROT 7.9  ALBUMIN 2.7*   No results found for this basename: LIPASE:5,AMYLASE:5 in the last 168 hours No results found for this basename: AMMONIA:3 in the last 168 hours  CBC:  Lab 12/25/11 0610 12/24/11 1236 12/24/11 0500  WBC 6.4 7.3 7.6  NEUTROABS -- -- 4.6  HGB 11.1* 11.4*  11.4*  HCT 32.0* 32.8* 34.4*  MCV 90.7 91.4 93.0  PLT 263 258 271    Cardiac Enzymes:  Lab 12/24/11 1715 12/24/11 1236 12/24/11 0500  CKTOTAL -- -- --  CKMB -- -- --  CKMBINDEX -- -- --  TROPONINI <0.30 <0.30 <0.30    BNP: BNP (last 3 results)  Basename 12/24/11 0709  PROBNP 13789.0*    CBG: No results found for this basename: GLUCAP:5 in the last 168 hours  Coagulation Studies: No results found for this basename: LABPROT:5,INR:5 in the last 72 hours  Other results:  ECG: STach 105. LVH No ST-T wave abnormalities.    Imaging:  No results found.   Medications:     Current Medications:    . aspirin EC  81 mg Oral Daily  . furosemide  40 mg Intravenous BID  . heparin  5,000 Units Subcutaneous Q8H  . levothyroxine  125 mcg Oral QAC breakfast  . lisinopril  20 mg Oral Daily  . sodium chloride  3 mL Intravenous Q12H  . sodium chloride  3 mL Intravenous Q12H  . spironolactone  50 mg Oral Daily  . DISCONTD: lisinopril  10 mg Oral Daily  . DISCONTD: megestrol  400 mg Oral Daily     Infusions:      Assessment:  1. Acute Systolic Heart Failure EF 30% 2. Hypothyroidism 3. HTN 4. Cirrohosis Ongoing alcohol use 1/2 pint per week 5. Laryngeal Cancer -S/P laryngectomy 2009  6. Non compliance. 7. A/C respiratory failure  (desaturation noted when talking < 80%) 8. COPD   Plan/Discussion:     Difficult situation. Multiple possible etiologies for his cardiomyopathy including CAD, ETOH (not dilated though), HTN, hypothyroidism etc. Hard to assess volume status but currently seems improved after  about 5 pound diuresis.   Ideally, I think he needs R and L heart cath to further assess but before we proceed I would like to have pulmonary see as he appears to drop his sats into the 60-70s while sitting in bed and mouthing words with his trach. Not sure if this is accurate or not but pulse-ox appears to track and hypoxemia seems way out of proportion to his HF.  For now would focus on titrating HF meds. Will continue Spironolactone (decrease to 25 daily) and Lisinopril. Can add bisoprolol soon. Would also add low dose lasix and digoxin.   Have asked pulmonary to see to comment on his degree of COPD and help with managing his hypoxia.   Once these issues straightened out will rediscuss heart cath with him and his daughter.   Check abdominal ultrasound due to assess for cirrhotic liver/ascites.  Will follow closely.  Daniel Bensimhon,MD 3:13 PM  Length of Stay: 2

## 2011-12-26 NOTE — Plan of Care (Signed)
Problem: Undesirable Food Choices (NB-1.7) Goal: Nutrition education Formal process to instruct or train a patient/client in a skill or to impart knowledge to help patients/clients voluntarily manage or modify food choices and eating behavior to maintain or improve health.  Outcome: Completed/Met Date Met:  12/26/11 Nutrition Education Note  RD consulted for nutrition education regarding new onset CHF.  RD provided "Living Well with Heart Failure" booklet. Reviewed patient's dietary recall. Provided examples on ways to decrease sodium intake in diet. Discouraged intake of processed foods and use of salt shaker. Encouraged fresh fruits and vegetables as well as whole grain sources of carbohydrates to maximize fiber intake.   RD discussed why it is important for patient to adhere to diet recommendations, and emphasized the role of fluids, foods to avoid, and importance of weighing self daily.  Pt frequents [employer] and [employer] for meals. Encouraged pt to chose lower sodium options at both paces such as substituting fresh fruit for french fries, choosing grilled meats over fried meats, increasing salads ETC. Pt does use the salt shaker at the table. Pt was encouraged to stop using salt shaker completely. Pt asked about salt substitutes, encouraged pt to speak with physician prior to using any salt substitutes.   Expect fair compliance.  Body mass index is 17.74 kg/(m^2). Pt meets criteria for underweight based on current BMI. Pt denies any recent weight loss. States he eats about 3-4 meals daily and has a good appetite.  Wt Readings from Last 5 Encounters:  12/26/11 127 lb 3.3 oz (57.7 kg)  11/09/11 123 lb 14.4 oz (56.201 kg)  06/22/11 120 lb (54.432 kg)  01/05/11 91 lb 3.2 oz (41.368 kg)  10/11/10 110 lb 6.4 oz (50.077 kg)     Current diet order is Heart, patient is consuming approximately 100% of meals at this time. Labs and medications reviewed. No further nutrition interventions  warranted at this time. RD contact information provided. If additional nutrition issues arise, please re-consult RD.   Clarene Duke RD, LDN Pager [phone] After Hours pager [phone]

## 2011-12-26 NOTE — Progress Notes (Signed)
Family Medicine Teaching Service Daily Progress Note Service Page: [phone]   Subjective: Patient complains of difficulty breathing this morning.  He complains of chest congestion and feels like he needs to cough up the secretions.  RT was in the room with me.  She says his stoma looks great and she cleaned it out the best she could.  Patient denies any chest pain, nausea/vomiting, or abdominal pain.  Objective: Temp:  [98.5 F (36.9 C)-98.9 F (37.2 C)] 98.8 F (37.1 C) (10/22 0439) Pulse Rate:  [88-110] 88  (10/22 0525) Resp:  [18-21] 20  (10/22 0525) BP: (113-148)/(79-108) 113/79 mmHg (10/22 0439) SpO2:  [93 %-100 %] 100 % (10/22 0525) FiO2 (%):  [28 %-40 %] 28 % (10/22 0525) Weight:  [127 lb 3.3 oz (57.7 kg)] 127 lb 3.3 oz (57.7 kg) (10/22 0439)  Exam: General: pleasant, laying in bed in no acute distress Cardiovascular: RRR, no murmur appreciated Respiratory: coarse BS, but no wheezes, rales, or rhonchi Abdomen: mild distension, but soft, + BS Extremities: no edema  I have reviewed the patient's medications, labs, imaging, and diagnostic testing.  Notable results are summarized below.  CBC BMET   Lab 12/25/11 0610 12/24/11 1236 12/24/11 0500  WBC 6.4 7.3 7.6  HGB 11.1* 11.4* 11.4*  HCT 32.0* 32.8* 34.4*  PLT 263 258 271    Lab 12/26/11 0545 12/25/11 0610 12/24/11 1236 12/24/11 0500  NA 136 137 -- 137  K 3.7 3.5 -- 4.2  CL 98 104 -- 104  CO2 24 21 -- 22  BUN 29* 23 -- 14  CREATININE 1.31 1.07 1.02 --  GLUCOSE 90 103* -- 94  CALCIUM 9.2 8.7 -- 9.0     Imaging/Diagnostic Tests:  BNP: 13789.0  CXR: Cardiac enlargement with pulmonary vascular congestion and  perihilar edema. Possible small bilateral pleural effusions.  TSH: 84.99   Plan: # SOB - Likely secondary to new onset CHF. Chest xray revealed pulmonary vascular congestion and perihilar edema and pro-BNP was elevated at 13789 (last prior was 519.7 in 2012). ECHO with EF 30% and diffuse hypokinesis -  Will consult Heart Failure Team for new onset systolic HF - Continue Daily weights, Strict I/O's.  - Creatinine increased from 1.07 to 1.31, likely over diuresis - Will decrease Lasix to 40 mg PO daily - Continue ACE-I - O2 via Trach Collar. Respiratory/nursing can suction as needed.   # Chest pain - Appears non cardiac in origin. However, given presentation will rule out ACS.  - CE negative - Continue asa 81mg   # HTN: BP improved after increasing Lasix to 20 mg daily.  Patient is currently on Lasix and Spirinolactone at home.  - Will continue to monitor  # Alcoholic Liver Cirrhosis  - Will continue Spironolactone. Lasix as indicated above.    # Hypothyroidism  - TSH elevated 84.99 - Will adjust synthroid (home dose 125 mcg) to 150 mcg - Recheck in 6 weeks outpatient  FEN/GI: Heart healthy diet.  Prophylaxis: Heparin SQ  Disposition: Pending continued clinical improvement  Code Status: Full Code   DE LA CRUZ,Shonica Weier, DO 12/26/2011, 9:01 AM

## 2011-12-26 NOTE — Progress Notes (Signed)
I interviewed and examined this patient and discussed the care plan with Dr. Tye Savoy and the FPTS team and agree with assessment and plan as documented in the progress note for today. He says that he had run out of his levothyroxine 3-4 days ago. A TSH in 6 weeks will tell us if he really needs the higher dose.    Skylinn Vialpando A. Sheffield Slider, MD Family Medicine Teaching Service Attending  12/26/2011 7:30 PM

## 2011-12-26 NOTE — Consult Note (Signed)
Name: [name] MRN: [id number] DOB: 1950/09/18    LOS: 2  Reason for consult  Preop catb clearance  Consulting MD Bensimhon   History of Present Illness:  [name] is a 61 year old with a PMH of COPD, laryngeal cancer S/P laryngectomy (2009), w/ chronic trach, HTN, hypothyroidism, cirrohsis (drinks 1/2 pint of liquor on weekends), and former tobacco (quit 2012).  He presented to [facility] ED 10//20/13 with 2 day h/o increased dyspnea and L sided chest pain, Pro BNP 13,789, CXR w/ diffuse pulmonary edema, CEs negative, and  EF 30% Not dilated w/ Diffuse hypokinesis. RV ok. Did endorse cough w/ yellow tinged sputum which is now resolved, no fever, chills or wheeze.  Started on Lasix 40 mg IV bid. Negative 4.2 liters. Weight down 5 pounds. CR now going up 0.8 -. 1.3. Now feels close to baseline denying dyspnea.  Cardiology feels he needs a left and right heart cath  However he has short witnessed episodes of desaturation:  noted to 60-70%, felt to be out of proportion to his heart failure. Of note he feels better, and from a pulm stand-point he is back at baseline.   Lines / Drains:  Cultures:  Antibiotics: None  Tests / Events: Echo 10/20: Left ventricle: LVEF is severely depressed at approximately 30%with diffuse hypokinesis. The cavity size was normal. Wall thickness was increased in a pattern of mild LVH. - Pulmonary arteries: PA peak pressure: 40mm Hg (S).    Past Medical History  Diagnosis Date  . Hypertension   . Throat cancer   . Cirrhosis of liver   . Shortness of breath   . Arthritis    Past Surgical History  Procedure Date  . Tracheal surgery   . Tracheostomy    Prior to Admission medications   Medication Sig Start Date End Date Taking? Authorizing Provider  acetaminophen (TYLENOL) 650 MG CR tablet Take 650 mg by mouth every 8 (eight) hours as needed. For pain    Yes Historical Provider, MD  aspirin EC 81 MG tablet Take 81 mg by mouth daily.   Yes  Historical Provider, MD  furosemide (LASIX) 20 MG tablet Take 20 mg by mouth 2 (two) times daily.   Yes Historical Provider, MD  Levothyroxine Sodium 125 MCG CAPS Take 1 capsule (125 mcg total) by mouth daily. 01/18/11  Yes Ivy de Lawson Radar, DO  megestrol (MEGACE) 400 MG/10ML suspension Take 10 mLs (400 mg total) by mouth daily. Dispense enough for one month 11/09/11  Yes Ivy de La Cruz, DO  potassium chloride SA (KLOR-CON M20) 20 MEQ tablet Take 20 mEq by mouth daily.     Yes Historical Provider, MD  spironolactone (ALDACTONE) 50 MG tablet Take 50 mg by mouth daily.   Yes Historical Provider, MD   Allergies Allergies  Allergen Reactions  . Vicodin (Hydrocodone-Acetaminophen) Rash    Family History No family history on file.  Social History  reports that he has quit smoking. His smoking use included Cigarettes. He has a 10 pack-year smoking history. He has never used smokeless tobacco. He reports that he drinks alcohol. He reports that he does not use illicit drugs.  Review Of Systems    Constitutional: No weight loss, + gain,no  night sweats, Fevers, chills, fatigue .  HEENT: No headaches, visual changes, Difficulty swallowing, Tooth/dental problems, or Sore throat,  No sneezing, itching, ear ache, nasal congestion, post nasal drip, no visual complaints CV:+ chest pain (resolved), - Orthopnea, -  PND, swelling in lower extremities (improved), -dizziness,- palpitations, -syncope.  GI No heartburn, indigestion, abdominal pain, nausea, vomiting, diarrhea, change in bowel habits, loss of appetite, bloody stools.  Resp: No cough further , No coughing up of blood. No . No wheezing.  Skin: no rash or itching or icterus GU: no dysuria, change in color of urine, no urgency or frequency. No flank pain, no hematuria  MS: No joint pain or swelling. No decreased range of motion  Psych: No change in mood or affect. No depression or anxiety.  Neuro: no difficulty with speech, weakness, numbness, ataxia     Vital Signs: BP 101/81  Pulse 99  Temp 98.4 F (36.9 C) (Oral)  Resp 20  Ht 5\' 11"  (1.803 m)  Wt 57.7 kg (127 lb 3.3 oz)  BMI 17.74 kg/m2  SpO2 97%        Intake/Output Summary (Last 24 hours) at 12/26/11 1537 Last data filed at 12/26/11 1300  Gross per 24 hour  Intake   1020 ml  Output   2100 ml  Net  -1080 ml    Physical Examination: General:  61 year old male currently in no acute distress.  Neuro:  Awake, oriented. No focal def  HEENT:  Stoma unremarkable  Cardiovascular:  rrr Lungs:  occ rhonchi Abdomen:  Soft, non-tender  Musculoskeletal:  Intact  Skin:  No sig edema   Ventilator settings: Vent Mode:  [-]  FiO2 (%):  [28 %-40 %] 28 %  Labs and Imaging:   Lab 12/26/11 0545 12/25/11 0610 12/24/11 1236 12/24/11 0500  NA 136 137 -- 137  K 3.7 3.5 -- 4.2  CL 98 104 -- 104  CO2 24 21 -- 22  BUN 29* 23 -- 14  CREATININE 1.31 1.07 1.02 --  GLUCOSE 90 103* -- 94    Lab 12/25/11 0610 12/24/11 1236 12/24/11 0500  HGB 11.1* 11.4* 11.4*  HCT 32.0* 32.8* 34.4*  WBC 6.4 7.3 7.6  PLT 263 258 271   PCXR: diffuse bilateral airspace disease c/w edema.   Assessment and Plan: 1) Acute respiratory distress (now resolved) in setting of decompensated heart failure and resultant pulmonary edema. Etiology of HF unclear: CAD, ETOH, HTN, or hypothyroidism. Do not think he has COPD.   2) Cirrhosis w/ ongoing ETOH abuse: currently no significant gross ascites, certainly not enough to contribute to dyspnea. Had paracentesis over a year ago. Pulled ~1.5 liters.   3) transient and episodic desaturation events. These are short term, and seem to correlate w/ hand movement, supporting artifact. Especially as he is completely asymptomatic during these events. I had [name] ambulate in the room. Did observe pulse ox reading drop, but this was only transient and he was completely asymptomatic. Desaturation events to to 70% range do not rebound drop and rebound in less than 5 seconds  (which his did).  Do not think these episodes reflect actual hypoxia. Given his symptomatic improvement and CXR findings on admission think CHF and edema are indeed significant enough to account for presenting dyspnea.   4) Chronic trach after prior laryngectomy. On chronic Oxygen. Not sure if he really needs this, but does feel that humidification helps so would continue.   Recommendations 1) ok from pulm stand-point to proceed w/ right and left heart cath at discretion of cards.  2) continue humidified oxygen 3) continue diuretics as Creatinine tolerates 4) repeat CXR in am 5) can't have PFTs due to trach status 6) no further pulm work-up   BABCOCK,PETE  12/26/2011, 3:37 PM  Attending:  I have seen and examined the patient with nurse practitioner/resident and agree with the note above.   This is a 61 y/o former smoker who was admitted on 10/20 with new onset heart failure and was found to have an EF of 30%.  After significant diuresis his CXR and symptoms have improved.   We have been asked to evaluate for ongoing hypoxemia.  Unfortunately neither [name] nor I were able to reproduce significant hypoxemia.  I had the patient breath room air for fifteen minutes both supine and lying flat and his O2 saturation never dropped below 93%.    I agree that there is likely a component of artifact.  Should this situation arise again to confirm hypoxemia you should check an ABG immediately as well as change placement of the O2 sat probe to the patient's forehead or earlobe rather than the finger (finger placement is a well documented cause of artifact so I always prefer forehead placement).  He is on chronic oxygen, so if he does prove to be hypoxemic while euvolemic I would consider other causes such as emphysema (noted on 2011 CT chest) vs. sess likely pulmonary AVM's from cirrhosis.  The latter would have be be diagnosed with a shunt study which is very easy to do but not indicated in the  absence of resting hypoxemia.  Unfortunately performing PFT's is technically difficult considering his total laryngectomy.  Recommend proceed with RHC and LHC and we will follow up the results with you.  If hypoxemia recurs we will f/u ABG results and perform a shunt study.    Yolonda Kida PCCM Pager: [phone] Cell: [phone] If no response, call [phone]

## 2011-12-27 ENCOUNTER — Inpatient Hospital Stay (HOSPITAL_COMMUNITY): Payer: PRIVATE HEALTH INSURANCE

## 2011-12-27 DIAGNOSIS — I5021 Acute systolic (congestive) heart failure: Secondary | ICD-10-CM | POA: Diagnosis not present

## 2011-12-27 DIAGNOSIS — J9620 Acute and chronic respiratory failure, unspecified whether with hypoxia or hypercapnia: Secondary | ICD-10-CM | POA: Diagnosis not present

## 2011-12-27 DIAGNOSIS — R0989 Other specified symptoms and signs involving the circulatory and respiratory systems: Secondary | ICD-10-CM | POA: Diagnosis not present

## 2011-12-27 DIAGNOSIS — R0902 Hypoxemia: Secondary | ICD-10-CM | POA: Diagnosis not present

## 2011-12-27 LAB — BASIC METABOLIC PANEL
BUN: 31 mg/dL — ABNORMAL HIGH (ref 6–23)
Chloride: 100 mEq/L (ref 96–112)
Creatinine, Ser: 1.21 mg/dL (ref 0.50–1.35)
GFR calc Af Amer: 74 mL/min — ABNORMAL LOW (ref >90)
GFR calc non Af Amer: 64 mL/min — ABNORMAL LOW (ref >90)

## 2011-12-27 LAB — FERRITIN: Ferritin: 489 ng/mL — ABNORMAL HIGH (ref 22–322)

## 2011-12-27 LAB — PROTIME-INR
INR: 1.01 (ref 0.00–1.49)
Prothrombin Time: 13.2 seconds (ref 11.6–15.2)

## 2011-12-27 MED ORDER — LEVOTHYROXINE SODIUM 150 MCG PO TABS
150.0000 ug | ORAL_TABLET | Freq: Every day | ORAL | Status: DC
  Administered 2011-12-28 – 2011-12-29 (×2): 150 ug via ORAL
  Filled 2011-12-27 (×3): qty 1

## 2011-12-27 MED ORDER — SODIUM CHLORIDE 0.9 % IJ SOLN
3.0000 mL | INTRAMUSCULAR | Status: DC | PRN
  Administered 2011-12-28: 3 mL via INTRAVENOUS

## 2011-12-27 MED ORDER — BISOPROLOL FUMARATE 5 MG PO TABS
2.5000 mg | ORAL_TABLET | Freq: Every day | ORAL | Status: DC
  Administered 2011-12-27: 2.5 mg via ORAL
  Filled 2011-12-27 (×3): qty 0.5

## 2011-12-27 MED ORDER — SODIUM CHLORIDE 0.9 % IV SOLN
INTRAVENOUS | Status: DC
  Administered 2011-12-28: 20 mL via INTRAVENOUS

## 2011-12-27 MED ORDER — SODIUM CHLORIDE 0.9 % IJ SOLN: 3.0000 mL | Freq: Two times a day (BID) | INTRAMUSCULAR | Status: DC

## 2011-12-27 MED ORDER — SODIUM CHLORIDE 0.9 % IV SOLN: 250.0000 mL | INTRAVENOUS | Status: DC | PRN

## 2011-12-27 MED ORDER — ASPIRIN 81 MG PO CHEW
324.0000 mg | CHEWABLE_TABLET | ORAL | Status: AC
  Administered 2011-12-28: 324 mg via ORAL
  Filled 2011-12-27: qty 4

## 2011-12-27 NOTE — Plan of Care (Signed)
Problem: Phase I Progression Outcomes Goal: EF % per last Echo/documented,Core Reminder form on chart Outcome: Completed/Met Date Met:  12/27/11 12/24/11 Echo EF 30 %

## 2011-12-27 NOTE — Progress Notes (Signed)
Advanced Heart Failure Rounding Note   Subjective:    [name] is a 61 year old with a PMH of COPD, laryngeal cancer S/P laryngectomy (2009), HTN, hypothyroidism, cirrohsis (drinks 1/2 pint of liquor on weekends), and former tobacco (quit 2012).   Denies any h/o known heart disease. Says he had stress test 3 years ago. (Walked on treadmill). Said it was normal.  He presented to [facility] ED 10//20/13 with increased dyspnea and L sided chest pain. Admission weight 132 pounds. Pertinent labs included Pro BNP 13,789, CEs negative, and TSH 84.2. 12/24/11 EF 30% Not dilated. Diffuse hypokinesis. RV ok. Started on Lasix 40 mg IV bid. Negative 4.2 liters. Weight down 5 pounds. CR now going up 0.8 -. 1.3.   Yesterday IV lasix stopped and switched to lasix 20 mg po and spironolactone cut back to 25 mg daily. Pulmonary evaluated with recommendations for ABG if he desaturates. During consultation O2 sat remained > 93%.  CXR improved pulmonary edema. Says he coughed up mucus.  Feels ok. Denies SOB/PND/Orthopnea     Objective:   Weight Range:  Vital Signs:   Temp:  [98.4 F (36.9 C)-98.7 F (37.1 C)] 98.4 F (36.9 C) (10/23 0505) Pulse Rate:  [84-99] 90  (10/23 0505) Resp:  [20] 20  (10/23 0505) BP: (101-120)/(70-85) 120/85 mmHg (10/23 0505) SpO2:  [96 %-97 %] 96 % (10/23 0505) FiO2 (%):  [28 %] 28 % (10/23 0505) Weight:  [57.834 kg (127 lb 8 oz)] 57.834 kg (127 lb 8 oz) (10/23 0505) Last BM Date: 12/25/11  Weight change: Filed Weights   12/25/11 0544 12/26/11 0439 12/27/11 0505  Weight: 59.24 kg (130 lb 9.6 oz) 57.7 kg (127 lb 3.3 oz) 57.834 kg (127 lb 8 oz)    Intake/Output:   Intake/Output Summary (Last 24 hours) at 12/27/11 0805 Last data filed at 12/27/11 0553  Gross per 24 hour  Intake    840 ml  Output   1025 ml  Net   -185 ml    Physical Exam:  General: Chronically ill appearing. No resp difficulty  HEENT: normal  Neck: supple. JVP hard to see - does not look overly elevated.  Carotids 2+ bilat; no bruits. No lymphadenopathy or thryomegaly appreciated. Tracheostomy  Cor: PMI nondisplaced. Regular rate & rhythm. +s3  Lungs: Clear 28%  Abdomen: soft, nontender, mildly distended. No hepatosplenomegaly. No bruits or masses. Good bowel sounds.  Extremities: no cyanosis, clubbing, rash, edema  Neuro: alert & orientedx3, cranial nerves grossly intact. moves all 4 extremities w/o difficulty. Affect pleasant     Telemetry: SR  Labs: Basic Metabolic Panel:  Lab 12/27/11 0819 12/26/11 0545 12/25/11 0610 12/24/11 1236 12/24/11 0500  NA 135 136 137 -- 137  K 4.0 3.7 3.5 -- 4.2  CL 100 98 104 -- 104  CO2 23 24 21 -- 22  GLUCOSE 92 90 103* -- 94  BUN 31* 29* 23 -- 14  CREATININE 1.21 1.31 1.07 1.02 0.97  CALCIUM 9.1 9.2 8.7 -- --  MG -- -- -- -- --  PHOS -- -- -- -- --    Liver Function Tests:  Lab 12/25/11 0610  AST 21  ALT 17  ALKPHOS 73  BILITOT 0.9  PROT 7.9  ALBUMIN 2.7*   No results found for this basename: LIPASE:5,AMYLASE:5 in the last 168 hours No results found for this basename: AMMONIA:3 in the last 168 hours  CBC:  Lab 12/25/11 0610 12/24/11 1236 12/24/11 0500  WBC 6.4 7.3 7.6  NEUTROABS -- --   4.6  HGB 11.1* 11.4* 11.4*  HCT 32.0* 32.8* 34.4*  MCV 90.7 91.4 93.0  PLT 263 258 271    Cardiac Enzymes:  Lab 12/24/11 1715 12/24/11 1236 12/24/11 0500  CKTOTAL -- -- --  CKMB -- -- --  CKMBINDEX -- -- --  TROPONINI <0.30 <0.30 <0.30    BNP: BNP (last 3 results)  Basename 12/24/11 0709  PROBNP 13789.0*     Other results:  EKG:   Imaging: Dg Chest Port 1 View  12/26/2011  *RADIOLOGY REPORT*  Clinical Data: Evaluate for edema  PORTABLE CHEST - 1 VIEW  Comparison: 12/24/2011  Findings: Heart size and vascular pattern are normal.  Minimal residual reticular nodular opacities seen in the bilateral lung bases.  There has been dramatic improvement in bilateral aeration with near complete resolution of previously described bilateral  opacities.  IMPRESSION: Near complete resolution of pulmonary edema.   Original Report Authenticated By: RAYMOND C. RUBNER, M.D.       Medications:     Scheduled Medications:    . aspirin EC  81 mg Oral Daily  . digoxin  0.125 mg Oral Daily  . furosemide  20 mg Oral Daily  . heparin  5,000 Units Subcutaneous Q8H  . levothyroxine  150 mcg Oral Q breakfast  . lisinopril  20 mg Oral Daily  . sodium chloride  3 mL Intravenous Q12H  . sodium chloride  3 mL Intravenous Q12H  . spironolactone  25 mg Oral Daily  . DISCONTD: furosemide  40 mg Intravenous BID  . DISCONTD: levothyroxine  125 mcg Oral QAC breakfast  . DISCONTD: spironolactone  50 mg Oral Daily     Infusions:     PRN Medications:  sodium chloride, ondansetron (ZOFRAN) IV, ondansetron, sodium chloride   Assessment:  1. Acute Systolic Heart Failure EF 30%  2. Hypothyroidism  3. HTN  4. Cirrohosis Ongoing alcohol use 1/2 pint per week  5. Laryngeal Cancer -S/P laryngectomy 2009  6. Non compliance.  7. A/C respiratory failure (desaturation noted when talking < 80%)  8. COPD    Plan/Discussion:    Volume status stable. Weight unchanged from yesterday (down 5 pounds from admit). Add bisoprolol 2.5 mg today. Renal function improving. Will continue lasix 20mg daily. Spiro 25. RHC/LHC tomorrow am.   Pulmonary recommendations appreciated. Saturations remained > 90% when answering questions.   PT consult ordered.  Nathaniel Rutledge,MD 3:34 PM     

## 2011-12-27 NOTE — Progress Notes (Signed)
I interviewed and examined this patient and discussed the care plan with Dr. Adriana Simas and the [facility] team and agree with assessment and plan as documented in the progress note for today. Dr Adriana Reams plan cardiac catheterization at 7:30 AM    Reiana Poteet A. Sheffield Slider, MD Family Medicine Teaching Service Attending  12/27/2011 9:15 PM

## 2011-12-27 NOTE — Consult Note (Signed)
Name: [name] MRN: [id number] DOB: September 20, 1950    LOS: 3  Reason for consult  Preop catb clearance  Consulting MD Bensimhon   History of Present Illness:  [name] is a 61 year old with a PMH of COPD, laryngeal cancer S/P laryngectomy (2009), w/ chronic trach, HTN, hypothyroidism, cirrohsis (drinks 1/2 pint of liquor on weekends), and former tobacco (quit 2012).  He presented to [facility] ED 10//20/13 with 2 day h/o increased dyspnea and L sided chest pain, Pro BNP 13,789, CXR w/ diffuse pulmonary edema, CEs negative, and  EF 30% Not dilated w/ Diffuse hypokinesis. RV ok. Did endorse cough w/ yellow tinged sputum which is now resolved, no fever, chills or wheeze.  Started on Lasix 40 mg IV bid. Negative 4.2 liters. Weight down 5 pounds. CR now going up 0.8 -. 1.3. Now feels close to baseline denying dyspnea.  Cardiology feels he needs a left and right heart cath  However he has short witnessed episodes of desaturation:  noted to 60-70%, felt to be out of proportion to his heart failure. Of note he feels better, and from a pulm stand-point he is back at baseline.   Lines / Drains:  Cultures:  Antibiotics: None  Tests / Events: Echo 10/20: Left ventricle: LVEF is severely depressed at approximately 30%with diffuse hypokinesis. The cavity size was normal. Wall thickness was increased in a pattern of mild LVH. - Pulmonary arteries: PA peak pressure: 40mm Hg (S).   Vital Signs: BP 125/82  Pulse 90  Temp 98.4 F (36.9 C) (Oral)  Resp 20  Ht 5\' 11"  (1.803 m)  Wt 57.834 kg (127 lb 8 oz)  BMI 17.78 kg/m2  SpO2 97%        Intake/Output Summary (Last 24 hours) at 12/27/11 1118 Last data filed at 12/27/11 1100  Gross per 24 hour  Intake    480 ml  Output   1300 ml  Net   -820 ml    Physical Examination: General:  61 year old male currently in no acute distress.  Neuro:  Awake, oriented. No focal def  HEENT:  Stoma unremarkable on t collar Cardiovascular:  rrr Lungs:   occ rhonchi Abdomen:  Soft, non-tender  Musculoskeletal:  Intact  Skin:  No sig edema   Ventilator settings: Vent Mode:  [-]  FiO2 (%):  [28 %] 28 %  Labs and Imaging:   Lab 12/27/11 0819 12/26/11 0545 12/25/11 0610  NA 135 136 137  K 4.0 3.7 3.5  CL 100 98 104  CO2 23 24 21   BUN 31* 29* 23  CREATININE 1.21 1.31 1.07  GLUCOSE 92 90 103*    Lab 12/25/11 0610 12/24/11 1236 12/24/11 0500  HGB 11.1* 11.4* 11.4*  HCT 32.0* 32.8* 34.4*  WBC 6.4 7.3 7.6  PLT 263 258 271   PCXR: diffuse bilateral airspace disease c/w edema.   Assessment and Plan: 1) Acute respiratory distress (now resolved) in setting of decompensated heart failure and resultant pulmonary edema. Etiology of HF unclear: CAD, ETOH, HTN, or hypothyroidism.  2) Cirrhosis w/ ongoing ETOH abuse: currently no significant gross ascites, certainly not enough to contribute to dyspnea. Had paracentesis over a year ago. Pulled ~1.5 liters. [place] abdo reviewed  3) transient and episodic desaturation events. Concern is artifactual.  He is at risk shunt and hepatopulmonary. Would pay close attention to eval for platypnea / orthodeoxia   4) Chronic trach after prior laryngectomy. On chronic Oxygen. Not sure if he  really needs this, but does feel that humidification helps so would continue.   5) some rise crt with lasix and cleared edema on pcxr and likely back to baseline resp status Recommendations 1) ok from pulm stand-point to proceed w/ right and left heart cath at discretion of cards.  2) continue humidified oxygen 3) consider reduction  Diuretics, per chf service 4) repeat CXR reviewed from 22nd, complete resolution edema 5) may need to hold acei if crt rises further 6) Abdo [place] reviewed, no ascites 7) will follow after rt heart cath to review results with chf service 8) if more concerns arise hypoxia with normal pcxr now, would have bubble study echo for shunt related to liver dz  [name]. Tyson Alias, MD, FACP Pgr:  [phone] [facility]

## 2011-12-27 NOTE — Progress Notes (Signed)
Family Medicine Teaching Service Daily Progress Note Service Page: [phone]   Subjective:  Patient reports substantial improvement in SOB.   Objective: Temp:  [98.4 F (36.9 C)-98.7 F (37.1 C)] 98.4 F (36.9 C) (10/23 0505) Pulse Rate:  [84-99] 90  (10/23 0505) Resp:  [20] 20  (10/23 0505) BP: (101-120)/(70-85) 120/85 mmHg (10/23 0505) SpO2:  [96 %-97 %] 96 % (10/23 0505) FiO2 (%):  [28 %] 28 % (10/23 0505) Weight:  [127 lb 8 oz (57.834 kg)] 127 lb 8 oz (57.834 kg) (10/23 0505)   Intake/Output Summary (Last 24 hours) at 12/27/11 0806 Last data filed at 12/27/11 0553  Gross per 24 hour  Intake    840 ml  Output   1025 ml  Net   -185 ml   Exam: General: pleasant, laying in bed in no acute distress Cardiovascular: RRR, no murmur appreciated Respiratory: CTAB. No rales, rhonchi, or wheezing. Abdomen: soft, nontender. Mildly distended. Extremities: No edema noted.   CBC BMET   Lab 12/25/11 0610 12/24/11 1236 12/24/11 0500  WBC 6.4 7.3 7.6  HGB 11.1* 11.4* 11.4*  HCT 32.0* 32.8* 34.4*  PLT 263 258 271    Lab 12/26/11 0545 12/25/11 0610 12/24/11 1236 12/24/11 0500  NA 136 137 -- 137  K 3.7 3.5 -- 4.2  CL 98 104 -- 104  CO2 24 21 -- 22  BUN 29* 23 -- 14  CREATININE 1.31 1.07 1.02 --  GLUCOSE 90 103* -- 94  CALCIUM 9.2 8.7 -- 9.0     Imaging/Diagnostic Tests:  BNP: 13789.0  CXR: Cardiac enlargement with pulmonary vascular congestion and  perihilar edema. Possible small bilateral pleural effusions.  TSH: 84.99  Plan: # SOB secondary to new onset CHF. Chest xray revealed pulmonary vascular congestion and perihilar edema and pro-BNP was elevated at 13789 (last prior was 519.7 in 2012). ECHO with EF 30% and diffuse hypokinesis - No weight loss overnight; net output of 185 mL - Heart failure team following and we greatly appreciate their help - Will continue Digoxin 0.125 mg, Lasix 20 mg, Lisinopril 20 mg, Spironolactone 25 mg.  HF to start Bisoprolol soon - O2  via Trach Collar. Respiratory/nursing can suction as needed.  - Will continue strict I/O's and daily weights - Patient to be schedule for R & L heart cath.   # Chest pain - Appears non cardiac in origin. However, given presentation will rule out ACS.  - CE negative x 3 - Continue asa 81mg   # HTN - Currently normotensive - Will continue to monitor  # Alcoholic Liver Cirrhosis  - Will continue Spironolactone. Lasix as indicated above.   - Patient going for Abd [place] today.  # Hypothyroidism  - TSH elevated 84.99 - Will continue Synthroid 150 mcg - Recheck in 6 weeks outpatient  FEN/GI: Heart healthy diet.  Prophylaxis: Heparin SQ  Disposition: Pending continued clinical improvement  Code Status: Full Code   Everlene Other, DO 12/27/2011, 8:05 AM

## 2011-12-28 ENCOUNTER — Ambulatory Visit (HOSPITAL_COMMUNITY)
Admission: RE | Admit: 2011-12-28 | Payer: PRIVATE HEALTH INSURANCE | Source: Ambulatory Visit | Admitting: Internal Medicine

## 2011-12-28 ENCOUNTER — Encounter (HOSPITAL_COMMUNITY)
Admission: EM | Disposition: A | Discharge status: Home / Self Care | Payer: Self-pay | Source: Home / Self Care | Attending: Family Medicine

## 2011-12-28 DIAGNOSIS — I509 Heart failure, unspecified: Secondary | ICD-10-CM

## 2011-12-28 HISTORY — PX: LEFT AND RIGHT HEART CATHETERIZATION WITH CORONARY ANGIOGRAM: SHX5449

## 2011-12-28 LAB — CBC
HCT: 40.6 % (ref 39.0–52.0)
Hemoglobin: 11.4 g/dL — ABNORMAL LOW (ref 13.0–17.0)
MCHC: 34.2 g/dL (ref 30.0–36.0)
MCHC: 33.7 g/dL (ref 30.0–36.0)
MCV: 94.4 fL (ref 78.0–100.0)
RBC: 3.6 MIL/uL — ABNORMAL LOW (ref 4.22–5.81)
RDW: 18 % — ABNORMAL HIGH (ref 11.5–15.5)
WBC: 7.2 10*3/uL (ref 4.0–10.5)

## 2011-12-28 LAB — BASIC METABOLIC PANEL
BUN: 33 mg/dL — ABNORMAL HIGH (ref 6–23)
GFR calc Af Amer: 73 mL/min — ABNORMAL LOW (ref >90)
GFR calc non Af Amer: 63 mL/min — ABNORMAL LOW (ref >90)
Potassium: 4.8 mEq/L (ref 3.5–5.1)
Sodium: 134 mEq/L — ABNORMAL LOW (ref 135–145)

## 2011-12-28 LAB — POCT I-STAT 3, VENOUS BLOOD GAS (G3P V)
Acid-base deficit: 2 mmol/L (ref 0.0–2.0)
Bicarbonate: 22.8 mEq/L (ref 20.0–24.0)
O2 Saturation: 65 %
O2 Saturation: 66 %
TCO2: 24 mmol/L (ref 0–100)
pCO2, Ven: 38.2 mmHg — ABNORMAL LOW (ref 45.0–50.0)
pH, Ven: 7.377 — ABNORMAL HIGH (ref 7.250–7.300)

## 2011-12-28 LAB — POCT I-STAT 3, ART BLOOD GAS (G3+)
Acid-base deficit: 2 mmol/L (ref 0.0–2.0)
O2 Saturation: 99 %

## 2011-12-28 LAB — CREATININE, SERUM: GFR calc non Af Amer: 69 mL/min — ABNORMAL LOW (ref >90)

## 2011-12-28 LAB — HEPATITIS PANEL, ACUTE
HCV Ab: NEGATIVE
Hep A IgM: NEGATIVE
Hep B C IgM: NEGATIVE
Hepatitis B Surface Ag: NEGATIVE

## 2011-12-28 SURGERY — LEFT AND RIGHT HEART CATHETERIZATION WITH CORONARY ANGIOGRAM
Anesthesia: LOCAL

## 2011-12-28 MED ORDER — SODIUM CHLORIDE 0.9 % IV SOLN
INTRAVENOUS | Status: AC
  Administered 2011-12-28: 11:00:00 via INTRAVENOUS

## 2011-12-28 MED ORDER — FENTANYL CITRATE 0.05 MG/ML IJ SOLN
INTRAMUSCULAR | Status: AC
  Filled 2011-12-28: qty 2

## 2011-12-28 MED ORDER — ACETAMINOPHEN 325 MG PO TABS: 650.0000 mg | ORAL_TABLET | ORAL | Status: DC | PRN

## 2011-12-28 MED ORDER — MIDAZOLAM HCL 2 MG/2ML IJ SOLN
INTRAMUSCULAR | Status: AC
  Filled 2011-12-28: qty 2

## 2011-12-28 MED ORDER — LIDOCAINE HCL (PF) 1 % IJ SOLN
INTRAMUSCULAR | Status: AC
  Filled 2011-12-28: qty 30

## 2011-12-28 MED ORDER — HEPARIN (PORCINE) IN NACL 2-0.9 UNIT/ML-% IJ SOLN
INTRAMUSCULAR | Status: AC
  Filled 2011-12-28: qty 1000

## 2011-12-28 MED ORDER — NITROGLYCERIN 0.2 MG/ML ON CALL CATH LAB
INTRAVENOUS | Status: AC
  Filled 2011-12-28: qty 1

## 2011-12-28 MED ORDER — HEPARIN SODIUM (PORCINE) 5000 UNIT/ML IJ SOLN
5000.0000 [IU] | Freq: Three times a day (TID) | INTRAMUSCULAR | Status: DC
  Administered 2011-12-28 – 2011-12-29 (×3): 5000 [IU] via SUBCUTANEOUS

## 2011-12-28 MED ORDER — ONDANSETRON HCL 4 MG/2ML IJ SOLN: 4.0000 mg | Freq: Four times a day (QID) | INTRAMUSCULAR | Status: DC | PRN

## 2011-12-28 NOTE — H&P (View-Only) (Signed)
Advanced Heart Failure Rounding Note   Subjective:    [name] is a 61 year old with a PMH of COPD, laryngeal cancer S/P laryngectomy (2009), HTN, hypothyroidism, cirrohsis (drinks 1/2 pint of liquor on weekends), and former tobacco (quit 2012).   Denies any h/o known heart disease. Says he had stress test 3 years ago. (Walked on treadmill). [place] Spaniel it was normal.  He presented to [facility] ED 10//20/13 with increased dyspnea and L sided chest pain. Admission weight 132 pounds. Pertinent labs included Pro BNP 13,789, CEs negative, and TSH 84.2. 12/24/11 EF 30% Not dilated. Diffuse hypokinesis. RV ok. Started on Lasix 40 mg IV bid. Negative 4.2 liters. Weight down 5 pounds. CR now going up 0.8 -. 1.3.   Yesterday IV lasix stopped and switched to lasix 20 mg po and spironolactone cut back to 25 mg daily. Pulmonary evaluated with recommendations for ABG if he desaturates. During consultation O2 sat remained > 93%.  CXR improved pulmonary edema. Says he coughed up mucus.  Feels ok. Denies SOB/PND/Orthopnea     Objective:   Weight Range:  Vital Signs:   Temp:  [98.4 F (36.9 C)-98.7 F (37.1 C)] 98.4 F (36.9 C) (10/23 0505) Pulse Rate:  [84-99] 90  (10/23 0505) Resp:  [20] 20  (10/23 0505) BP: (101-120)/(70-85) 120/85 mmHg (10/23 0505) SpO2:  [96 %-97 %] 96 % (10/23 0505) FiO2 (%):  [28 %] 28 % (10/23 0505) Weight:  [57.834 kg (127 lb 8 oz)] 57.834 kg (127 lb 8 oz) (10/23 0505) Last BM Date: 12/25/11  Weight change: Filed Weights   12/25/11 0544 12/26/11 0439 12/27/11 0505  Weight: 59.24 kg (130 lb 9.6 oz) 57.7 kg (127 lb 3.3 oz) 57.834 kg (127 lb 8 oz)    Intake/Output:   Intake/Output Summary (Last 24 hours) at 12/27/11 0805 Last data filed at 12/27/11 0553  Gross per 24 hour  Intake    840 ml  Output   1025 ml  Net   -185 ml    Physical Exam:  General: Chronically ill appearing. No resp difficulty  HEENT: normal  Neck: supple. JVP hard to see - does not look overly elevated.  Carotids 2+ bilat; no bruits. No lymphadenopathy or thryomegaly appreciated. Tracheostomy  Cor: PMI nondisplaced. Regular rate & rhythm. +s3  Lungs: Clear 28%  Abdomen: soft, nontender, mildly distended. No hepatosplenomegaly. No bruits or masses. Good bowel sounds.  Extremities: no cyanosis, clubbing, rash, edema  Neuro: alert & orientedx3, cranial nerves grossly intact. moves all 4 extremities w/o difficulty. Affect pleasant     Telemetry: SR  Labs: Basic Metabolic Panel:  Lab 12/27/11 4098 12/26/11 0545 12/25/11 0610 12/24/11 1236 12/24/11 0500  NA 135 136 137 -- 137  K 4.0 3.7 3.5 -- 4.2  CL 100 98 104 -- 104  CO2 23 24 21  -- 22  GLUCOSE 92 90 103* -- 94  BUN 31* 29* 23 -- 14  CREATININE 1.21 1.31 1.07 1.02 0.97  CALCIUM 9.1 9.2 8.7 -- --  MG -- -- -- -- --  PHOS -- -- -- -- --    Liver Function Tests:  Lab 12/25/11 0610  AST 21  ALT 17  ALKPHOS 73  BILITOT 0.9  PROT 7.9  ALBUMIN 2.7*   No results found for this basename: LIPASE:5,AMYLASE:5 in the last 168 hours No results found for this basename: AMMONIA:3 in the last 168 hours  CBC:  Lab 12/25/11 0610 12/24/11 1236 12/24/11 0500  WBC 6.4 7.3 7.6  NEUTROABS -- --  4.6  HGB 11.1* 11.4* 11.4*  HCT 32.0* 32.8* 34.4*  MCV 90.7 91.4 93.0  PLT 263 258 271    Cardiac Enzymes:  Lab 12/24/11 1715 12/24/11 1236 12/24/11 0500  CKTOTAL -- -- --  CKMB -- -- --  CKMBINDEX -- -- --  TROPONINI <0.30 <0.30 <0.30    BNP: BNP (last 3 results)  Basename 12/24/11 0709  PROBNP 13789.0*     Other results:  EKG:   Imaging: Dg Chest Port 1 View  12/26/2011  *RADIOLOGY REPORT*  Clinical Data: Evaluate for edema  PORTABLE CHEST - 1 VIEW  Comparison: 12/24/2011  Findings: Heart size and vascular pattern are normal.  Minimal residual reticular nodular opacities seen in the bilateral lung bases.  There has been dramatic improvement in bilateral aeration with near complete resolution of previously described bilateral  opacities.  IMPRESSION: Near complete resolution of pulmonary edema.   Original Report Authenticated By: Otilio Carpen, M.D.       Medications:     Scheduled Medications:    . aspirin EC  81 mg Oral Daily  . digoxin  0.125 mg Oral Daily  . furosemide  20 mg Oral Daily  . heparin  5,000 Units Subcutaneous Q8H  . levothyroxine  150 mcg Oral Q breakfast  . lisinopril  20 mg Oral Daily  . sodium chloride  3 mL Intravenous Q12H  . sodium chloride  3 mL Intravenous Q12H  . spironolactone  25 mg Oral Daily  . DISCONTD: furosemide  40 mg Intravenous BID  . DISCONTD: levothyroxine  125 mcg Oral QAC breakfast  . DISCONTD: spironolactone  50 mg Oral Daily     Infusions:     PRN Medications:  sodium chloride, ondansetron (ZOFRAN) IV, ondansetron, sodium chloride   Assessment:  1. Acute Systolic Heart Failure EF 30%  2. Hypothyroidism  3. HTN  4. Cirrohosis Ongoing alcohol use 1/2 pint per week  5. Laryngeal Cancer -S/P laryngectomy 2009  6. Non compliance.  7. A/C respiratory failure (desaturation noted when talking < 80%)  8. COPD    Plan/Discussion:    Volume status stable. Weight unchanged from yesterday (down 5 pounds from admit). Add bisoprolol 2.5 mg today. Renal function improving. Will continue lasix 20mg  daily. Spiro 25. RHC/LHC tomorrow am.   Pulmonary recommendations appreciated. Saturations remained > 90% when answering questions.   PT consult ordered.  Truman Hayward 3:34 PM

## 2011-12-28 NOTE — Interval H&P Note (Signed)
History and Physical Interval Note:  12/28/2011 8:31 AM  [name]  has presented today for surgery, with the diagnosis of heart failure. The various methods of treatment have been discussed with the patient and family. After consideration of risks, benefits and other options for treatment, the patient has consented to  Procedure(s) (LRB) with comments: LEFT AND RIGHT HEART CATHETERIZATION WITH CORONARY ANGIOGRAM (N/A) as a surgical intervention .  The patient's history has been reviewed, patient examined, no change in status, stable for surgery.  I have reviewed the patient's chart and labs.  Questions were answered to the patient's satisfaction.     Roxan Yamamoto

## 2011-12-28 NOTE — Progress Notes (Signed)
I interviewed and examined this patient and discussed the care plan with Dr. Adriana Simas and the [facility] team and agree with assessment and plan as documented in the progress note for today.    Nathaniel Bowsher A. Sheffield Slider, MD Family Medicine Teaching Service Attending  12/28/2011 3:54 PM

## 2011-12-28 NOTE — Progress Notes (Signed)
Family Medicine Teaching Service Daily Progress Note Service Page: [phone]   Subjective:  Patient seen following R & L heart cath. Patient feeling well.  No complaints.  No CP, SOB.  Objective: Temp:  [98.4 F (36.9 C)-99 F (37.2 C)] 98.4 F (36.9 C) (10/24 0620) Pulse Rate:  [71-92] 71  (10/24 0620) Resp:  [20-22] 20  (10/24 0620) BP: (101-125)/(67-85) 119/85 mmHg (10/24 0620) SpO2:  [95 %-99 %] 98 % (10/24 0620) FiO2 (%):  [28 %] 28 % (10/24 0620) Weight:  [126 lb 5.2 oz (57.3 kg)] 126 lb 5.2 oz (57.3 kg) (10/24 0516) Weight on Admission - 132. Patient down 1 lb from yesterday.  Intake/Output Summary (Last 24 hours) at 12/28/11 0756 Last data filed at 12/28/11 0730  Gross per 24 hour  Intake    540 ml  Output   2310 ml  Net  -1770 ml   Exam: General: pleasant, laying in bed in no acute distress Cardiovascular: RRR, no murmur appreciated Respiratory: CTAB. No rales, rhonchi, or wheezing. Abdomen: soft, nontender. Mildly distended. Extremities: No edema noted.   CBC BMET   Lab 12/28/11 0500 12/25/11 0610 12/24/11 1236  WBC 7.2 6.4 7.3  HGB 11.4* 11.1* 11.4*  HCT 33.3* 32.0* 32.8*  PLT 288 263 258    Lab 12/28/11 0500 12/27/11 0819 12/26/11 0545  NA 134* 135 136  K 4.8 4.0 3.7  CL 100 100 98  CO2 23 23 24   BUN 33* 31* 29*  CREATININE 1.22 1.21 1.31  GLUCOSE 89 92 90  CALCIUM 9.5 9.1 9.2     Imaging/Diagnostic Tests:  BNP: 13789.0  CXR: Cardiac enlargement with pulmonary vascular congestion and  perihilar edema. Possible small bilateral pleural effusions.  TSH: 84.99  Abd [place]: IMPRESSION:  1. Cholelithiasis without cholecystitis.  2. Normal appearance of the liver, with resolution of ascites  since 11/14/2010.  3. Bilateral pleural effusions.  4. Right-sided renal collecting system stone.  Assessment/Plan: [name] is a 61 y.o. year old male with a PMH of Laryngeal cancer s/p laryngectomy and tracheostomy, HTN, hypothyroidism, and  cirrhosis of the liver who presents with SOB and left-sided chest pain x 2 days. Chest xray and BNP consistent with acute CHF.  # SOB secondary to new onset CHF. Chest xray revealed pulmonary vascular congestion and perihilar edema and pro-BNP was elevated at 13789 (last prior was 519.7 in 2012). ECHO with EF 30% and diffuse hypokinesis - Net output of 1770 mL - Heart failure team following and we greatly appreciate their help - Will continue Digoxin 0.125 mg, Lasix 20 mg, Lisinopril 20 mg, Spironolactone 25 mg, Bisoprolol 2.5 mg daily - O2 via Trach Collar. Respiratory/nursing can suction as needed.  - Will continue strict I/O's and daily weights - R & L heart cath today revealed:  1. Normal coronaries, 2. Severe non-ischemic CM. EF 15-20%, 3. Low filling pressures with normal cardiac output  # Chest pain - Appears non cardiac in origin. However, given presentation will rule out ACS.  - CE negative x 3 - Continue asa 81mg   # HTN - Currently normotensive - Will continue to monitor  # Alcoholic Liver Cirrhosis  - Will continue Spironolactone. Lasix as indicated above.   - US revealed normal appearing liver with resolution of prior ascites   # Hypothyroidism  - TSH elevated 84.99 - Will continue Synthroid 150 mcg - Recheck in 6 weeks outpatient  FEN/GI: Heart healthy diet.  Prophylaxis: Heparin SQ  Disposition: Pending continued clinical improvement  Code Status: Full Code   Everlene Other, DO 12/28/2011, 7:56 AM

## 2011-12-28 NOTE — CV Procedure (Addendum)
Cardiac Cath Procedure Note  Indication: Heart Failure  Procedures performed:  1) Right heart cathererization 2) Selective coronary angiography 3) Left heart catheterization 4) Left ventriculogram  Description of procedure:     The risks and indication of the procedure were explained. Consent was signed and placed on the chart. An appropriate timeout was taken prior to the procedure. The right groin was prepped and draped in the routine sterile fashion and anesthetized with 1% local lidocaine.   A 5 FR arterial sheath was placed in the right femoral artery using a modified Seldinger technique. Standard catheters including a JL4, JR4 and angled pigtail were used. All catheter exchanges were made over a wire. A 7 FR venous sheath was placed in the right femoral vein using a modified Seldinger technique. A standard Swan-Ganz catheter was used for the procedure.   Complications:  None apparent  Findings:  RA =  1  RV =  16/0/2 PA =   16/6 (11) PCW = 3 Fick cardiac output/index = 4.4/2.5 PVR = 1.7 Woods FA sat = 99% PA sat = 65%, 66%  Ao Pressure: LV Pressure: There was no signficant gradient across the aortic valve on pullback.  Left main: Normal  LAD: Large vessel with 2 large diagonals. Mild streaming in midsection. No stenosis.  LCX: Large dominant vessel with 2 OMs, 1PL and PDA. No stenosis  RCA: Small non-dominant vessel. No stenosis  LV-gram done in the RAO projection: Ejection fraction = Severe global HK. EF 15-20% On panning down of the descending aorta -> sluggish flow with mild plaquing  Assessment: 1. Normal coronaries 2. Severe non-ischemic CM. EF 15-20% 3. Low filling pressures with normal cardiac output  Plan/Discussion:  He has a severe cardiomyopathy of unknown etiology. His coronaries are normal. His filling pressures are low with normal cardiac output despite severely depressed EF. We will hydrate gently post-cath.  He will need aggressive f/u in HF  clinic. Probable d/c in am.   Arvilla Meres, MD 8:55 AM

## 2011-12-29 DIAGNOSIS — R0609 Other forms of dyspnea: Secondary | ICD-10-CM | POA: Diagnosis not present

## 2011-12-29 DIAGNOSIS — I5021 Acute systolic (congestive) heart failure: Secondary | ICD-10-CM | POA: Diagnosis not present

## 2011-12-29 DIAGNOSIS — R0902 Hypoxemia: Secondary | ICD-10-CM | POA: Diagnosis not present

## 2011-12-29 DIAGNOSIS — J9620 Acute and chronic respiratory failure, unspecified whether with hypoxia or hypercapnia: Secondary | ICD-10-CM | POA: Diagnosis not present

## 2011-12-29 LAB — BASIC METABOLIC PANEL
BUN: 26 mg/dL — ABNORMAL HIGH (ref 6–23)
CO2: 21 mEq/L (ref 19–32)
Chloride: 102 mEq/L (ref 96–112)
Creatinine, Ser: 1.11 mg/dL (ref 0.50–1.35)

## 2011-12-29 MED ORDER — FUROSEMIDE 20 MG PO TABS: 20.0000 mg | ORAL_TABLET | Freq: Every day | ORAL | Status: DC

## 2011-12-29 MED ORDER — SPIRONOLACTONE 25 MG PO TABS: 25.0000 mg | ORAL_TABLET | Freq: Every day | ORAL | Status: DC

## 2011-12-29 MED ORDER — BISOPROLOL FUMARATE 5 MG PO TABS: 2.5000 mg | ORAL_TABLET | Freq: Every day | ORAL | Status: DC

## 2011-12-29 MED ORDER — DIGOXIN 125 MCG PO TABS: 0.1250 mg | ORAL_TABLET | Freq: Every day | ORAL | Status: DC

## 2011-12-29 MED ORDER — FUROSEMIDE 20 MG PO TABS
20.0000 mg | ORAL_TABLET | Freq: Every day | ORAL | Status: DC
  Administered 2011-12-29: 20 mg via ORAL
  Filled 2011-12-29: qty 1

## 2011-12-29 MED ORDER — LEVOTHYROXINE SODIUM 150 MCG PO TABS: 150.0000 ug | ORAL_TABLET | Freq: Every day | ORAL | Status: DC

## 2011-12-29 MED ORDER — LISINOPRIL 20 MG PO TABS: 20.0000 mg | ORAL_TABLET | Freq: Every day | ORAL | Status: DC

## 2011-12-29 MED ORDER — BISOPROLOL FUMARATE 5 MG PO TABS
5.0000 mg | ORAL_TABLET | Freq: Every day | ORAL | Status: DC
  Administered 2011-12-29: 5 mg via ORAL
  Filled 2011-12-29: qty 1

## 2011-12-29 MED ORDER — FUROSEMIDE 20 MG PO TABS
20.0000 mg | ORAL_TABLET | ORAL | Status: DC | PRN
  Filled 2011-12-29: qty 1

## 2011-12-29 MED ORDER — FUROSEMIDE 20 MG PO TABS: 20.0000 mg | ORAL_TABLET | ORAL | Status: DC | PRN

## 2011-12-29 NOTE — Progress Notes (Signed)
Family Medicine Teaching Service Daily Progress Note Service Page: [phone]   Subjective:  Feeling well this am but did report some right sided rib pain. No other complaints.  Objective: Temp:  [98.2 F (36.8 C)-98.6 F (37 C)] 98.3 F (36.8 C) (10/25 0434) Pulse Rate:  [66-88] 88  (10/25 0434) Resp:  [20-22] 20  (10/25 0434) BP: (101-140)/(59-94) 130/94 mmHg (10/25 0434) SpO2:  [98 %-100 %] 100 % (10/25 0434) FiO2 (%):  [28 %] 28 % (10/25 0434) Weight:  [128 lb 1.6 oz (58.106 kg)] 128 lb 1.6 oz (58.106 kg) (10/25 0434) Weight on Admission - 132.   Intake/Output Summary (Last 24 hours) at 12/29/11 0758 Last data filed at 12/29/11 0600  Gross per 24 hour  Intake    720 ml  Output   2300 ml  Net  -1580 ml   Exam: General: pleasant, laying in bed in no acute distress Cardiovascular: RRR, no murmur appreciated Respiratory: CTAB. No rales, rhonchi, or wheezing. Abdomen: soft, nontender. Mildly distended. Extremities: No edema noted.   CBC BMET   Lab 12/28/11 1314 12/28/11 0500 12/25/11 0610  WBC 5.6 7.2 6.4  HGB 13.7 11.4* 11.1*  HCT 40.6 33.3* 32.0*  PLT 287 288 263    Lab 12/29/11 0630 12/28/11 1314 12/28/11 0500 12/27/11 0819  NA 133* -- 134* 135  K 4.9 -- 4.8 4.0  CL 102 -- 100 100  CO2 21 -- 23 23  BUN 26* -- 33* 31*  CREATININE 1.11 1.14 1.22 --  GLUCOSE 95 -- 89 92  CALCIUM 9.3 -- 9.5 9.1     Imaging/Diagnostic Tests:  BNP: 13789.0  CXR: Cardiac enlargement with pulmonary vascular congestion and  perihilar edema. Possible small bilateral pleural effusions.  TSH: 84.99  Abd [place]: IMPRESSION:  1. Cholelithiasis without cholecystitis.  2. Normal appearance of the liver, with resolution of ascites  since 11/14/2010.  3. Bilateral pleural effusions.  4. Right-sided renal collecting system stone.  Assessment/Plan: [name] is a 61 y.o. year old male with a PMH of Laryngeal cancer s/p laryngectomy and tracheostomy, HTN, hypothyroidism, and  cirrhosis of the liver who presents with SOB and left-sided chest pain x 2 days. Chest xray and BNP consistent with acute CHF.  # SOB secondary to new onset CHF. Chest xray revealed pulmonary vascular congestion and perihilar edema and pro-BNP was elevated at 13789 (last prior was 519.7 in 2012). ECHO with EF 30% and diffuse hypokinesis - Heart failure team following and we greatly appreciate their help - Will continue Digoxin 0.125 mg, Lisinopril 20 mg, Spironolactone 25 mg, Bisoprolol 5 mg daily - O2 via Trach Collar. Respiratory/nursing can suction as needed.  - R & L heart cath today revealed:  1. Normal coronaries, 2. Severe non-ischemic CM. EF 15-20%, 3. Low filling pressures with normal cardiac output - D/C today after Cardiology sees.    # Chest pain - Appears non cardiac in origin. However, given presentation will rule out ACS.  - CE negative x 3 - Continue ASA 81 mg daily  # HTN - Will continue Lisinopril, Bisoprolol, and Spironolactone as indicated above.  # Alcoholic Liver Cirrhosis  - Will continue Spironolactone. Lasix as indicated above.   - US revealed normal appearing liver with resolution of prior ascites   # Hypothyroidism  - TSH elevated 84.99 - Will continue Synthroid 150 mcg - Recheck in 6 weeks outpatient  FEN/GI: Heart healthy diet.  Prophylaxis: Heparin SQ  Disposition: D/C today Code Status: Full Code  Everlene Other, DO 12/29/2011, 7:58 AM

## 2011-12-29 NOTE — Progress Notes (Signed)
Advanced Heart Failure Rounding Note   Subjective:    [name] is a 61 year old with a PMH of COPD, laryngeal cancer S/P laryngectomy (2009), HTN, hypothyroidism, cirrohsis (drinks 1/2 pint of liquor on weekends), and former tobacco (quit 2012).   Yesterday after RHC diuretics held. Weight up 2 poounds.  12/29/11  RHC/LHC  RA = 1  RV = 16/0/2  PA = 16/6 (11)  PCW = 3  Fick cardiac output/index = 4.4/2.5  PVR = 1.7 Woods  FA sat = 99%  PA sat = 65%, 66% Normal coronaries  BMET pending. Ambulating in room.    Denies SOB/PND/Orthopnea/Dizziness. Productive cough     Objective:   Weight Range:  Vital Signs:   Temp:  [98.2 F (36.8 C)-98.6 F (37 C)] 98.3 F (36.8 C) (10/25 0434) Pulse Rate:  [66-88] 88  (10/25 0919) Resp:  [18-22] 18  (10/25 0816) BP: (101-140)/(59-94) 120/77 mmHg (10/25 0920) SpO2:  [98 %-100 %] 98 % (10/25 0816) FiO2 (%):  [28 %] 28 % (10/25 0816) Weight:  [58.106 kg (128 lb 1.6 oz)] 58.106 kg (128 lb 1.6 oz) (10/25 0434) Last BM Date: 12/25/11  Weight change: Filed Weights   12/27/11 0505 12/28/11 0516 12/29/11 0434  Weight: 57.834 kg (127 lb 8 oz) 57.3 kg (126 lb 5.2 oz) 58.106 kg (128 lb 1.6 oz)    Intake/Output:   Intake/Output Summary (Last 24 hours) at 12/29/11 0947 Last data filed at 12/29/11 0925  Gross per 24 hour  Intake   1203 ml  Output   2300 ml  Net  -1097 ml    Physical Exam:  General: Chronically ill appearing. No resp difficulty  HEENT: normal  Neck: supple. JVP hard to see - does not look overly elevated. Carotids 2+ bilat; no bruits. No lymphadenopathy or thryomegaly appreciated. Tracheostomy  Cor: PMI nondisplaced. Regular rate & rhythm. +s3  Lungs: Clear 28%  Abdomen: soft, nontender, mildly distended. No hepatosplenomegaly. No bruits or masses. Good bowel sounds.  Extremities: no cyanosis, clubbing, rash, edema  Neuro: alert & orientedx3, cranial nerves grossly intact. moves all 4 extremities w/o difficulty. Affect  pleasant     Telemetry: SR  Labs: Basic Metabolic Panel:  Lab 12/29/11 4098 12/28/11 1314 12/28/11 0500 12/27/11 0819 12/26/11 0545 12/25/11 0610  NA 133* -- 134* 135 136 137  K 4.9 -- 4.8 4.0 3.7 3.5  CL 102 -- 100 100 98 104  CO2 21 -- 23 23 24 21   GLUCOSE 95 -- 89 92 90 103*  BUN 26* -- 33* 31* 29* 23  CREATININE 1.11 1.14 1.22 1.21 1.31 --  CALCIUM 9.3 -- 9.5 9.1 -- --  MG -- -- -- -- -- --  PHOS -- -- -- -- -- --    Liver Function Tests:  Lab 12/25/11 0610  AST 21  ALT 17  ALKPHOS 73  BILITOT 0.9  PROT 7.9  ALBUMIN 2.7*   No results found for this basename: LIPASE:5,AMYLASE:5 in the last 168 hours No results found for this basename: AMMONIA:3 in the last 168 hours  CBC:  Lab 12/28/11 1314 12/28/11 0500 12/25/11 0610 12/24/11 1236 12/24/11 0500  WBC 5.6 7.2 6.4 7.3 7.6  NEUTROABS -- -- -- -- 4.6  HGB 13.7 11.4* 11.1* 11.4* 11.4*  HCT 40.6 33.3* 32.0* 32.8* 34.4*  MCV 94.4 92.5 90.7 91.4 93.0  PLT 287 288 263 258 271    Cardiac Enzymes:  Lab 12/24/11 1715 12/24/11 1236 12/24/11 0500  CKTOTAL -- -- --  CKMB -- -- --  CKMBINDEX -- -- --  TROPONINI <0.30 <0.30 <0.30    BNP: BNP (last 3 results)  Basename 12/24/11 0709  PROBNP 13789.0*     Other results:  EKG:   Imaging: US Abdomen Complete  12/27/2011  *RADIOLOGY REPORT*  Clinical Data:  Abdominal distention.  Possible liver abnormalities.  COMPLETE ABDOMINAL ULTRASOUND  Comparison:  CT of 11/14/2010  Findings:  Gallbladder:  Gallstones, measuring up to 1.4 cm.  No wall thickening or pericholecystic fluid. Sonographic Murphy's sign was not elicited.  Common bile duct: Normal, 4 mm.  Liver: Normal in echogenicity, without focal lesion.  IVC: Negative  Pancreas:  Negative  Spleen:  Normal in size and echogenicity.  Right Kidney:  11.0 cm.  Mild renal cortical thinning.  9 mm lower pole collecting system nonobstructive stone.  Left Kidney:  11.2 cm. No hydronephrosis.  Abdominal aorta:   Atherosclerotic irregularity, without aneurysm.  Left and probable right pleural effusion.  No residual ascites identified.  IMPRESSION:  1.  Cholelithiasis without cholecystitis. 2.  Normal appearance of the liver, with resolution of ascites since 11/14/2010. 3.  Bilateral pleural effusions. 4.  Right-sided renal collecting system stone.   Original Report Authenticated By: Consuello Bossier, M.D.      Medications:     Scheduled Medications:    . aspirin EC  81 mg Oral Daily  . bisoprolol  5 mg Oral Daily  . digoxin  0.125 mg Oral Daily  . heparin  5,000 Units Subcutaneous Q8H  . levothyroxine  150 mcg Oral QAC breakfast  . lisinopril  20 mg Oral Daily  . sodium chloride  3 mL Intravenous Q12H  . sodium chloride  3 mL Intravenous Q12H  . spironolactone  25 mg Oral Daily  . DISCONTD: bisoprolol  2.5 mg Oral Daily  . DISCONTD: furosemide  20 mg Oral Daily  . DISCONTD: heparin  5,000 Units Subcutaneous Q8H  . DISCONTD: sodium chloride  3 mL Intravenous Q12H    Infusions:    . sodium chloride 75 mL/hr at 12/28/11 1044  . DISCONTD: sodium chloride 20 mL (12/28/11 0618)    PRN Medications: sodium chloride, acetaminophen, furosemide, ondansetron (ZOFRAN) IV, ondansetron (ZOFRAN) IV, ondansetron, sodium chloride, DISCONTD: sodium chloride, DISCONTD: sodium chloride   Assessment:  1. Acute Systolic Heart Failure EF 30%  2. Hypothyroidism  3. HTN  4. Cirrohosis Ongoing alcohol use 1/2 pint per week  5. Laryngeal Cancer -S/P laryngectomy 2009  6. Non compliance.  7. A/C respiratory failure (desaturation noted when talking < 80%)  8. COPD    Plan/Discussion:   Cath from yesterday reviewed with him. Normal cors. Volume low.   Volume status stable. Much improved. Can d/c today..  Will continue home dose lasix at 20mg  daily + sliding scale.  Give lasix 20 mg daily  po if his weight is 130 pounds or > in 24 hours. BMET pending. He will be followed closely in HF clinic with  appointment scheduled next Thursday.   Explained the need to weigh and record daily. He does have a scale and can not afford one therefore we will provide a scale . Would like to use tele-monitoring however he only has a cell phone. Will need HH RN for HF management. Explained need for ETOH abstinence.  Meds on d/c: Bisoprolol 2.5 mg daily Spironolactone 25 mg daily Lasix 20md daily + 20 mg prn if weight is 130 pounds or greater in 24 hour period. Lisinopril 20 mg daily Digoxin  0.125 mg daily   Can stop asa  Reuel Boom Attila Mccarthy,MD 9:50 AM

## 2011-12-29 NOTE — Discharge Summary (Signed)
Family Medicine [facility] Discharge Summary  Patient name: [name] Medical record number: [id number] Date of birth: 1950-12-13 Age: 62 y.o. Gender: male Date of Admission: 12/24/2011  Date of Discharge: 12/29/11 Admitting Physician: Dolores Patty, MD  Primary Care Provider: DE LA CRUZ,IVY, DO  Indication for Hospitalization: Shortness of breath, left sided chest pain Discharge Diagnoses:  Acute Systolic Heart Failure secondary to non-ischemic cardiomyopathy Chest pain Chronic Respiratory Failure Hypertension Alcoholic Liver Cirrhosis Hypothyroidism  Brief Hospital Course:  61 y.o. year old male with a PMH of Laryngeal cancer s/p laryngectomy and tracheostomy, HTN, hypothyroidism, and cirrhosis of the liver who presented with SOB and left-sided chest pain x 2 days. Chest xray and BNP consistent with new onset acute congestive heart failure.  1) Acute Systolic Heart Failure secondary to non-ischemic cardiomyopathy On admission, chest xray revealed pulmonary vascular congestion and perihilar edema and pro-BNP 13789.  Echo revealed EF of 30 % with diffuse hypokinesis.  Patient was diuresed with IV and PO Lasix with marked improvement in SOB.  Heart failure team was consulted during admission.  Patient was subsequently started on Lisinopril, Bisoprolol, and Digoxin.  Patient was continued on Spironolactone.  Right and Left heart catherization was done and revealed severely depressed EF (15 - 20%) with normal coronary arteries.  Patient continued to improve with the above therapy and was asymptomatic at discharge.  Patient will need continued follow up with the Heart Failure clinic for aggressive medical management.  2) Chest pain, non-cardiac Patient presented with SOB and cough.  Chest pain was left-sided and located at the lower ribs.  EKG revealed sinus tachycardia but no signs of ischemia.  Troponin I was negative x 1.   Catherization revealed normal coronary  arteries.   Chest pain subsequently resolved following improvement in SOB.  3) Chronic respiratory failure Patient received supplemental oxygen via Trach collar during admission.  4) HTN Patient was treated with Bisoprolol, Lisinopril, Spironolactone, and Lasix during admission.  5) Alcoholic liver cirrhosis Patient was continued on Lasix and Spironolactone.  Abdominal ultrasound was also done during admission and revealed normal appearing liver with resolution of ascites noted in 2012 (for complete results see below).    6) Hypothyroidism Patient was on Synthroid 125 mcg.  TSH obtained during admission revealed TSH of 84.299.  Synthroid was increased to 150 mcg daily.  Patient will need further titration of therapy as an outpatient.  Significant Labs and Imaging:   CBC BMET   Lab 12/28/11 1314 12/28/11 0500 12/25/11 0610  WBC 5.6 7.2 6.4  HGB 13.7 11.4* 11.1*  HCT 40.6 33.3* 32.0*  PLT 287 288 263    Lab 12/29/11 0630 12/28/11 1314 12/28/11 0500 12/27/11 0819  NA 133* -- 134* 135  K 4.9 -- 4.8 4.0  CL 102 -- 100 100  CO2 21 -- 23 23  BUN 26* -- 33* 31*  CREATININE 1.11 1.14 1.22 --  GLUCOSE 95 -- 89 92  CALCIUM 9.3 -- 9.5 9.1     BNP    Component Value Date/Time   PROBNP 13789.0* 12/24/2011 0709   Lab Results  Component Value Date   TSH 84.299* 12/24/2011   R & L heart cath 1. Normal coronaries 2. Severe non-ischemic CM. EF 15-20% 3. Low filling pressures with normal cardiac output  CXR: Cardiac enlargement with pulmonary vascular congestion and  perihilar edema. Possible small bilateral pleural effusions.   Abd [place]:  IMPRESSION:  1. Cholelithiasis without cholecystitis.  2. Normal appearance of the liver,  with resolution of ascites  since 11/14/2010.  3. Bilateral pleural effusions.  4. Right-sided renal collecting system stone.  Echo: Left ventricle: LVEF is severely depressed at approximately 30% with diffuse hypokinesis. The cavity size was normal.  Wall thickness was increased in a pattern of mild LVH.  Procedures: Right and Left Heart Catherization  Consultations: Heart Failure, Dr. Gala Romney  Discharge Medications:    Medication List     As of 12/29/2011  2:09 PM    STOP taking these medications         acetaminophen 650 MG CR tablet   Commonly known as: TYLENOL      aspirin EC 81 MG tablet      KLOR-CON M20 20 MEQ tablet   Generic drug: potassium chloride SA      Levothyroxine Sodium 125 MCG Caps      megestrol 400 MG/10ML suspension   Commonly known as: MEGACE      TAKE these medications         bisoprolol 5 MG tablet   Commonly known as: ZEBETA   Take 0.5 tablets (2.5 mg total) by mouth daily.      digoxin 0.125 MG tablet   Commonly known as: LANOXIN   Take 1 tablet (0.125 mg total) by mouth daily.      furosemide 20 MG tablet   Commonly known as: LASIX   Take 1 tablet (20 mg total) by mouth daily.      furosemide 20 MG tablet   Commonly known as: LASIX   Take 1 tablet (20 mg total) by mouth as needed (weight 130 poounds or greater).      levothyroxine 150 MCG tablet   Commonly known as: SYNTHROID, LEVOTHROID   Take 1 tablet (150 mcg total) by mouth daily before breakfast.      lisinopril 20 MG tablet   Commonly known as: PRINIVIL,ZESTRIL   Take 1 tablet (20 mg total) by mouth daily.      spironolactone 25 MG tablet   Commonly known as: ALDACTONE   Take 1 tablet (25 mg total) by mouth daily.       Issues for Follow Up:  1) Patient needs follow up TSH in approximately 6 weeks as Synthroid dose was increased during admission 2) Compliance with medications and continued follow up with Heart Failure clinic 3) Recommend follow up BMP to assess potassium  Outstanding Results: None  Discharge Instructions: Patient was counseled important signs and symptoms that should prompt return to medical care, changes in medications, dietary instructions, activity restrictions, and follow up  appointments.      Follow-up Information    Follow up with Arvilla Meres, MD. On 01/04/2012. (at 2:00 garage code 0800)    Contact information:   [address] [place] [address] [phone]       Follow up with DE LA CRUZ,IVY, DO. On 01/03/2012. (10:15 am)    Contact information:   [address] [place] [address] [phone]          Discharge Condition: Stable. Discharged home.  Adriana Simas [place], DO 12/29/2011, 2:09 PM

## 2011-12-29 NOTE — Consult Note (Signed)
Name: [name] MRN: [id number] DOB: Jul 27, 1950    LOS: 5  Reason for consult  Preop catb clearance  Consulting MD Bensimhon   History of Present Illness:  Mr Clear is a 61 year old with a PMH of COPD, laryngeal cancer S/P laryngectomy (2009), w/ chronic trach, HTN, hypothyroidism, cirrohsis (drinks 1/2 pint of liquor on weekends), and former tobacco (quit 2012).  He presented to [facility] ED 10//20/13 with 2 day h/o increased dyspnea and L sided chest pain, Pro BNP 13,789, CXR w/ diffuse pulmonary edema, CEs negative, and  EF 30% Not dilated w/ Diffuse hypokinesis. RV ok. Did endorse cough w/ yellow tinged sputum which is now resolved, no fever, chills or wheeze.  Started on Lasix 40 mg IV bid. Negative 4.2 liters. Weight down 5 pounds. CR now going up 0.8 -. 1.3. Now feels close to baseline denying dyspnea.  Cardiology feels he needs a left and right heart cath  However he has short witnessed episodes of desaturation:  noted to 60-70%, felt to be out of proportion to his heart failure. Of note he feels better, and from a pulm stand-point he is back at baseline.      Antibiotics: None  Tests / Events: Echo 10/20: Left ventricle: LVEF is severely depressed at approximately 30%with diffuse hypokinesis. The cavity size was normal. Wall thickness was increased in a pattern of mild LVH. - Pulmonary arteries: PA peak pressure: 40mm Hg (S).   Vital Signs: BP 130/94  Pulse 73  Temp 98.3 F (36.8 C) (Oral)  Resp 18  Ht 5\' 11"  (1.803 m)  Wt 128 lb 1.6 oz (58.106 kg)  BMI 17.87 kg/m2  SpO2 98% FIO2 0.28 t collar      . sodium chloride 75 mL/hr at 12/28/11 1044  . DISCONTD: sodium chloride 20 mL (12/28/11 0618)     Intake/Output Summary (Last 24 hours) at 12/29/11 0843 Last data filed at 12/29/11 0800  Gross per 24 hour  Intake    960 ml  Output   2300 ml  Net  -1340 ml    Physical Examination: General:  61 year old male currently in no acute distress.  Neuro:  Awake,  oriented. No focal def  HEENT:  Stoma unremarkable on t collar Cardiovascular:  rrr Lungs:  occ rhonchi Abdomen:  Soft, non-tender  Musculoskeletal:  Intact  Skin:  No sig edema    Labs and Imaging:   Lab 12/29/11 0630 12/28/11 1314 12/28/11 0500 12/27/11 0819  NA 133* -- 134* 135  K 4.9 -- 4.8 4.0  CL 102 -- 100 100  CO2 21 -- 23 23  BUN 26* -- 33* 31*  CREATININE 1.11 1.14 1.22 --  GLUCOSE 95 -- 89 92    Lab 12/28/11 1314 12/28/11 0500 12/25/11 0610  HGB 13.7 11.4* 11.1*  HCT 40.6 33.3* 32.0*  WBC 5.6 7.2 6.4  PLT 287 288 263   PCXR 10/22 Near complete resolution of pulmonary edema.   Assessment and Plan: 1) Acute respiratory distress (now resolved) in setting of decompensated heart failure and resultant pulmonary edema. Etiology of HF unclear: CAD, ETOH, HTN, or hypothyroidism. - LHC/RHC c/w cardiomyopathy with nl PVR  2) Cirrhosis w/ ongoing ETOH abuse: currently no significant gross ascites, certainly not enough to contribute to dyspnea.   3) transient and episodic desaturation events. Concern is artifactual.  He is at risk shunt and hepatopulmonary. Would pay close attention to eval for platypnea / orthodeoxia   4) Chronic  trach after prior laryngectomy. On chronic Oxygen. Not sure if he really needs this, but does feel that humidification helps so would continue.   5) Mild renal insufficiency some rise crt with lasix and cleared edema on pcxr and likely back to baseline resp status  Recommendations No further pccm inpt f/u needed, we can see as outpt prn   Sandrea Hughs, MD Pulmonary and Critical Care Medicine [facility] [phone]

## 2011-12-29 NOTE — Care Management Note (Signed)
    Page 1 of 1   12/29/2011     2:34:16 PM   CARE MANAGEMENT NOTE 12/29/2011  Patient:  JACINTO, LEVA   Account Number:  [id number]  Date Initiated:  12/29/2011  Documentation initiated by:  CRAFT,TERRI  Subjective/Objective Assessment:   61 yo male admitted 10/20/3 with CP, SOB     Action/Plan:   D/C when medically stable   Anticipated DC Date:  12/29/2011   Anticipated DC Plan:  HOME W HOME HEALTH SERVICES      DC Planning Services  CM consult      Choice offered to / List presented to:  C-1 Patient        HH arranged  HH-1 RN      [facility] agency  [facility]   Status of service:  Completed, signed off  Discharge Disposition:  HOME W HOME HEALTH SERVICES  Per UR Regulation:  Reviewed for med. necessity/level of care/duration of stay  Comments:  12/29/11, Kathi Der RNC-MNN, BSN, [phone], CM received referral and met with pt to offer choice for [facility] services. Pt chose [facility].  Marie at [facility] contacted with order and confirmation received.

## 2011-12-30 NOTE — Progress Notes (Signed)
I interviewed and examined this patient and discussed the care plan with Dr. Adriana Simas and the [facility] team and agree with assessment and plan as documented in the progress note.    Kendricks Reap A. Sheffield Slider, MD Family Medicine Teaching Service Attending  12/30/2011 9:30 PM

## 2011-12-31 NOTE — Discharge Summary (Signed)
I discussed the care plan with Dr. Adriana Simas and the [facility] team and agree with assessment and plan as documented in the discharge note.    Maelyn Berrey A. Sheffield Slider, MD Family Medicine Teaching Service Attending  12/31/2011 8:31 PM

## 2012-01-03 ENCOUNTER — Ambulatory Visit (INDEPENDENT_AMBULATORY_CARE_PROVIDER_SITE_OTHER): Payer: PRIVATE HEALTH INSURANCE | Admitting: Family Medicine

## 2012-01-03 ENCOUNTER — Encounter: Payer: Self-pay | Admitting: Family Medicine

## 2012-01-03 VITALS — BP 164/96 | HR 81 | Temp 98.7°F | Wt 131.0 lb

## 2012-01-03 DIAGNOSIS — R630 Anorexia: Secondary | ICD-10-CM

## 2012-01-03 DIAGNOSIS — I10 Essential (primary) hypertension: Secondary | ICD-10-CM

## 2012-01-03 MED ORDER — DICLOFENAC SODIUM 1 % TD GEL: 2.0000 g | Freq: Four times a day (QID) | TRANSDERMAL | Status: DC | PRN

## 2012-01-03 MED ORDER — MIRTAZAPINE 15 MG PO TABS: 15.0000 mg | ORAL_TABLET | Freq: Every day | ORAL | Status: DC

## 2012-01-03 NOTE — Assessment & Plan Note (Addendum)
BP elevated today.  He takes Lasix 20 mg PRN for weight 130 lbs or greater, Spironolactone 20, and Lisinopril 20. Weight today 131.  Advised patient to take one Lasix tablet today. Patient has an appointment with HF clinic tomorrow. Follow up with me in 3 months or sooner as needed. If BP remains elevated, may increase Lisinopril to 40 mg daily.

## 2012-01-03 NOTE — Progress Notes (Signed)
  Subjective:    [name], male    DOB: 11/03/1950, 61 y.o.   MRN: [id number]  HPI  Hypertension: BP elevated today.  He has taken BP medications today, but no Lasix.  He has been diagnosed with systolic CHF and is followed at HF Clinic.  Patient denies any dyspnea/SOB, chest pain, pedal edema.  He has been watching sodium intake.  Complains of pain in RT upper extremity (shoulder, elbow) due to OA, which may be contributing to BP elevation.  Denies any HA, blurry vision, chest pain, or weakness at this time.  Decreased appetite: Patient has lost sense of smell and taste status post laryngectomy/tracheostomy.  He was taking Megace which was working well until it was stopped in the hospital for newly diagnosed systolic CHF.  [name] family would like to know which vitamins to purchase now that patient is not eating well.  Family mentioned HH RN wanting to start Marinol.  They have tried Ensure and Boost, but too costly.  Weight actually increased today 131 lb, but may be related to fluid.  Review of Systems  Per HPI    Objective:   Physical Exam  Constitutional: No distress.  HENT:       Stoma appears normal  Cardiovascular: Normal rate and regular rhythm.   Pulmonary/Chest: Effort normal and breath sounds normal. He has no wheezes. He has no rales. He exhibits no tenderness.  Musculoskeletal: He exhibits no edema.       Assessment & Plan:

## 2012-01-03 NOTE — Assessment & Plan Note (Signed)
Megace stopped in hospital due to adverse effect of fluid retention in patient with systolic heart failure. Will start Remeron 15 mg daily today to increase appetite. Patient/family to call me if they do not see improvement in appetite in 4-6 weeks.

## 2012-01-03 NOTE — Patient Instructions (Addendum)
Please purchase one a day multivitamin over the counter. Pick up Remeron and use as directed as an appetite stimulant. Your blood pressure was elevated today.  Take one extra Lasix tablet this evening. Schedule follow up appointment with me in 3 months. It was great to see you today.  I am glad you are feeling better.

## 2012-01-04 ENCOUNTER — Ambulatory Visit (HOSPITAL_COMMUNITY)
Admit: 2012-01-04 | Discharge: 2012-01-04 | Disposition: A | Discharge status: Home / Self Care | Payer: PRIVATE HEALTH INSURANCE | Attending: Internal Medicine | Admitting: Internal Medicine

## 2012-01-04 ENCOUNTER — Encounter (HOSPITAL_COMMUNITY): Payer: Self-pay

## 2012-01-04 VITALS — BP 112/74 | HR 78 | Wt 128.1 lb

## 2012-01-04 DIAGNOSIS — F1020 Alcohol dependence, uncomplicated: Secondary | ICD-10-CM

## 2012-01-04 DIAGNOSIS — I5022 Chronic systolic (congestive) heart failure: Secondary | ICD-10-CM

## 2012-01-04 LAB — BASIC METABOLIC PANEL
CO2: 19 mEq/L (ref 19–32)
Chloride: 101 mEq/L (ref 96–112)
GFR calc Af Amer: 60 mL/min — ABNORMAL LOW (ref >90)
Potassium: 4.4 mEq/L (ref 3.5–5.1)
Sodium: 134 mEq/L — ABNORMAL LOW (ref 135–145)

## 2012-01-04 MED ORDER — BISOPROLOL FUMARATE 5 MG PO TABS: 5.0000 mg | ORAL_TABLET | Freq: Every day | ORAL | Status: DC

## 2012-01-04 NOTE — Assessment & Plan Note (Addendum)
NYHA II. Volume status stable. Continue current diuretic regimen and only take lasix 20 mg daily if his weight is 130 pounds or greater. Increase bisoprolol to 5 mg daily. Plan to titrate HF medications as he tolerates and repeat ECHO in 3 months. Lengthy conversation with him and his daughters about daily weights, medications compliance, remaining alcohol free, low salt food choices, and limiting fluid intake to less than 2 liters per day. Check BMET today. Follow up in 2 weeks.

## 2012-01-04 NOTE — Progress Notes (Signed)
[name], male   DOB: March 02, 1951, 61 y.o.   MRN: [id number]  Weight Range   Baseline proBNP     HPI: [name] is a 61 year old with a PMH of COPD, , chronic systolic heart failure (EF 30%), laryngeal cancer S/P laryngectomy (2009), HTN, hypothyroidism, cirrohsis (drinks 1/2 - 1  Bottle of liquor daily prior to hospital admit in October) and former tobacco (quit 2012). 12/24/11 ECHO EF 30%   12/29/11 D/C from [facility] A/C systolic heart failure. Bisoprolol 2.5 mg daily Spironolactone 25 mg daily Lasix 20 mg as needed if weight is 130 pounds or greater in 24 hour period Lisinopril 20 mg daily Digoxin 0.125 mg daily . Discharge weight 128 pounds.    12/29/11 RHC/LHC  RA = 1  RV = 16/0/2  PA = 16/6 (11)  PCW = 3  Fick cardiac output/index = 4.4/2.5  PVR = 1.7 [place]  FA sat = 99%  PA sat = 65%, 66%  Normal coronaries   He returns for post hospital follow up. Denies SOB/PND/Orthopnea. Denies dyspnea going up steps.  Weight at home 126-131. He has taken one lasix for weight 131 pounds. He only take lasix if his weight is 130 pounds or greater. Compliant with medications. [facility] following. He has remained alchol free but his daughter says he drank 1/2 - 1 bottle of bourbon per day prior most recent admission. She is concerned he will start drinking again as this has been an ongoing struggle. Daughter says he cooks daily with lots of salt for flavoring. Followed by [facility] with tele monitoring.    ROS: All systems negative except as listed in HPI, PMH and Problem List.  Past Medical History  Diagnosis Date  . Hypertension   . Throat cancer   . Cirrhosis of liver   . Shortness of breath   . Arthritis     Current Outpatient Prescriptions  Medication Sig Dispense Refill  . bisoprolol (ZEBETA) 5 MG tablet Take 0.5 tablets (2.5 mg total) by mouth daily.  30 tablet  0  . diclofenac sodium (VOLTAREN) 1 % GEL Apply 2 g topically 4 (four) times daily as needed.  100 g  3  . digoxin (LANOXIN)  0.125 MG tablet Take 1 tablet (0.125 mg total) by mouth daily.  30 tablet  0  . furosemide (LASIX) 20 MG tablet Take 1 tablet (20 mg total) by mouth daily.  30 tablet  0  . furosemide (LASIX) 20 MG tablet Take 1 tablet (20 mg total) by mouth as needed (weight 130 poounds or greater).  30 tablet  0  . levothyroxine (SYNTHROID, LEVOTHROID) 150 MCG tablet Take 1 tablet (150 mcg total) by mouth daily before breakfast.  30 tablet  0  . lisinopril (PRINIVIL,ZESTRIL) 20 MG tablet Take 1 tablet (20 mg total) by mouth daily.  30 tablet  0  . mirtazapine (REMERON) 15 MG tablet Take 1 tablet (15 mg total) by mouth at bedtime.  30 tablet  1  . spironolactone (ALDACTONE) 25 MG tablet Take 1 tablet (25 mg total) by mouth daily.  30 tablet  0     PHYSICAL EXAM: Filed Vitals:   01/04/12 1429  BP: 112/74  Pulse: 78  Weight: 128 lb 1.9 oz (58.115 kg)  SpO2: 98%   128 (discharge weight 128 pounds) General:  Cachetic . Chronically ill appearing. No resp difficulty (daughter and daughter in law present) HEENT: tracheostomy with pink peristoma Neck: supple. JVP flat. Carotids 2+ bilaterally; no  bruits. No lymphadenopathy or thryomegaly appreciated. Cor: PMI normal. Regular rate & rhythm. No rubs, gallops or murmurs. Lungs: clear Abdomen: soft, nontender, nondistended. No hepatosplenomegaly. No bruits or masses. Good bowel sounds. Extremities: no cyanosis, clubbing, rash, edema Neuro: alert & orientedx3, cranial nerves grossly intact. Moves all 4 extremities w/o difficulty. Affect pleasant.      ASSESSMENT & PLAN:

## 2012-01-04 NOTE — Patient Instructions (Addendum)
Take Bisoprolol 5 mg daily  Do the following things EVERYDAY: 1) Weigh yourself in the morning before breakfast. Write it down and keep it in a log. 2) Take your medicines as prescribed 3) Eat low salt foods-Limit salt (sodium) to 2000 mg per day.  4) Stay as active as you can everyday 5) Limit all fluids for the day to less than 2 liters  Follow up in 2 weeks.

## 2012-01-04 NOTE — Assessment & Plan Note (Signed)
Former alcoholic. Congratulated on him on remaining alcohol free. Discussed negative impact alcohol can have on his heart. His daughter is very concerned about a relapse however [name] was not interested in counseling for alcohol abuse. Will follow up at outpatient visit.

## 2012-01-18 ENCOUNTER — Ambulatory Visit (INDEPENDENT_AMBULATORY_CARE_PROVIDER_SITE_OTHER): Payer: PRIVATE HEALTH INSURANCE | Admitting: Family Medicine

## 2012-01-18 ENCOUNTER — Encounter: Payer: Self-pay | Admitting: Family Medicine

## 2012-01-18 VITALS — BP 107/70 | HR 96 | Temp 98.8°F | Wt 128.0 lb

## 2012-01-18 DIAGNOSIS — T148XXA Other injury of unspecified body region, initial encounter: Secondary | ICD-10-CM | POA: Insufficient documentation

## 2012-01-18 DIAGNOSIS — R630 Anorexia: Secondary | ICD-10-CM

## 2012-01-18 MED ORDER — ACETAMINOPHEN 500 MG PO TABS: 500.0000 mg | ORAL_TABLET | Freq: Three times a day (TID) | ORAL | Status: DC | PRN

## 2012-01-18 NOTE — Progress Notes (Signed)
  Subjective:    [name], male    DOB: Jan 16, 1951, 61 y.o.   MRN: [id number]  HPI  Decreased appetite: Patient has lost sense of smell and taste status post laryngectomy/tracheostomy.  He was taking Megace which was working well until it was stopped in the hospital for newly diagnosed systolic CHF. They have tried Ensure and Boost, but too costly.  Weight actually stable today 128 lb.  At last visit, I gave Rx for Remeron 15 mg daily.  He has been taking this for 2 weeks and believes that has actually made symptoms worse.  Patient sees good food but cannot eat it because he has no appetite.  Family does not want to continue Remeron.  Patient has an appointment with HF clinic tomorrow and will discuss restarting Megace since this seemed to the only medication that helped stimulate appetite.  Daughter is concerned about restarting Megace and increasing Lasix as needed for fluid retention.  Family is interested in Nutrition referral.  Stoma lesion: Patient complains of throbbing pain inside stoma site.  He has Voltaren gel for arthritis and wants to know if he can use it skin surrounding stoma. There is a lesion that looks like an open wound.  He used to use Q-tips to clean out stoma which irritates his skin, but he does it anyway.  No associated fever, chills, or NS.    Review of Systems Per HPI    Objective:   Physical Exam Constitutional: No distress.  HENT: Stoma open but there is mild erythema superiorly with open scab and ulcer Pulmonary/Chest: Effort normal and breath sounds normal.  Musculoskeletal: He exhibits no edema.       Assessment & Plan:

## 2012-01-18 NOTE — Patient Instructions (Addendum)
Thanks for coming back to see me today. Apply antibiotic ointment to your stoma until it heals. Take Tylenol 500 mg every 8 hours as needed for pain. STOP taking Remeron and see if your appetite returns. Discuss with Heart Failure Clinic that Megace is the only medication that helped stimulate appetite. We will call and let you know when to come in to Nutrition clinic. Schedule follow up appointment with me in 1-2 months or sooner as needed.

## 2012-01-18 NOTE — Assessment & Plan Note (Signed)
Located on skin surrounding stoma.  No cellulitis. - For pain, gave Tylenol 500 Q 8 hr PRN - Gave samples of triple antibiotic ointment to be applied TID until lesion heals - Follow up as needed

## 2012-01-18 NOTE — Assessment & Plan Note (Signed)
Megace stopped in hospital due to adverse effect of fluid retention in patient with systolic heart failure.  Started Remeron 15 mg daily, but this has made symptoms worse. - Gave patient and family 2 options: 1) continue Remeron for 2 more weeks to see if it makes a difference or 2) restart Megace knowing that it can cause fluid retention and increase Lasix PRN - Patient and family want to stop Remeron and see if patient's appetite comes back on its own and will discuss restarting Megace with HF clinic tomorrow - Will schedule appointment with Nutrition Clinic next week for further recommendations

## 2012-01-19 ENCOUNTER — Encounter (HOSPITAL_COMMUNITY): Payer: Self-pay

## 2012-01-19 ENCOUNTER — Ambulatory Visit (HOSPITAL_COMMUNITY)
Admission: RE | Admit: 2012-01-19 | Discharge: 2012-01-19 | Disposition: A | Discharge status: Home / Self Care | Payer: PRIVATE HEALTH INSURANCE | Source: Ambulatory Visit | Attending: Internal Medicine | Admitting: Internal Medicine

## 2012-01-19 VITALS — BP 102/78 | HR 109 | Wt 128.0 lb

## 2012-01-19 DIAGNOSIS — I5022 Chronic systolic (congestive) heart failure: Secondary | ICD-10-CM

## 2012-01-19 NOTE — Progress Notes (Signed)
Patient is considered to be at high risk for readmission due to: [x]  Recently admitted for CHF [ ]  >3 admissions in 6 months [ ]  >/=6 ED visits/year  According to the Adherence Estimator tool, the patient is low risk due to commitment, low risk due to concern (safety), and low risk due to cost.   The biggest concern the patient had was he has been getting dizzy when standing up. BP is low today in clinic. The patient reports missing zero doses/week.  Lasix, bisoprolol, spiro, and lisinopril were discussed in detail regarding how it works, importance of taking it, possible sides effects and how to avoid them. Written information was provided to support the discussion.    Reported weighing on a daily basis.   Restricts sodium, but no specific was given. He eats mainly fresh foods.  Time spent counseling: 20 minutes   Doris Cheadle, PharmD Clinical Pharmacist Pager: [phone] Phone: [phone] 01/19/2012 10:55 AM

## 2012-01-19 NOTE — Patient Instructions (Addendum)
Follow up in 3 weeks  If your are dizzy cut back lisinopril to 10 mg daily  Do the following things EVERYDAY: 1) Weigh yourself in the morning before breakfast. Write it down and keep it in a log. 2) Take your medicines as prescribed 3) Eat low salt foods-Limit salt (sodium) to 2000 mg per day.  4) Stay as active as you can everyday 5) Limit all fluids for the day to less than 2 liters

## 2012-01-19 NOTE — Progress Notes (Signed)
[name], male   DOB: 10-28-1950, 61 y.o.   MRN: [id number]   Weight Range   Baseline proBNP     HPI: [name] is a 61 year old with a PMH of COPD, , chronic systolic heart failure (EF 30%), laryngeal cancer S/P laryngectomy (2009), HTN, hypothyroidism, cirrohsis (drinks 1/2 - 1  Bottle of liquor daily prior to hospital admit in October) and former tobacco (quit 2012). 12/24/11 ECHO EF 30%   12/29/11 D/C from [facility] A/C systolic heart failure. Bisoprolol 2.5 mg daily Spironolactone 25 mg daily Lasix 20 mg as needed if weight is 130 pounds or greater in 24 hour period Lisinopril 20 mg daily Digoxin 0.125 mg daily . Discharge weight 128 pounds.    12/29/11 RHC/LHC  RA = 1  RV = 16/0/2  PA = 16/6 (11)  PCW = 3  Fick cardiac output/index = 4.4/2.5  PVR = 1.7 Woods  FA sat = 99%  PA sat = 65%, 66%  Normal coronaries   He returns for follow up with his daughter and daughter in law.  Last visit bisoprolol increased to 5 mg daily. Denies SOB/PND/Orthopnea. Poor appetite.  Weight at home 121-126 pounds. He has had lasix 2 times over the last 2 weeks.  Occasionally dizzy. Poor appetite. He says he has quit drinking alcohol but his daughter says he had one drink in the last 2 weeks. Compliant with medications.   ROS: All systems negative except as listed in HPI, PMH and Problem List.  Past Medical History  Diagnosis Date  . Hypertension   . Throat cancer   . Cirrhosis of liver   . Shortness of breath   . Arthritis     Current Outpatient Prescriptions  Medication Sig Dispense Refill  . acetaminophen (TYLENOL) 500 MG tablet Take 1 tablet (500 mg total) by mouth every 8 (eight) hours as needed for pain.  90 tablet  0  . bisoprolol (ZEBETA) 5 MG tablet Take 1 tablet (5 mg total) by mouth daily.  30 tablet  3  . diclofenac sodium (VOLTAREN) 1 % GEL Apply 2 g topically 4 (four) times daily as needed.  100 g  3  . digoxin (LANOXIN) 0.125 MG tablet Take 1 tablet (0.125 mg total) by  mouth daily.  30 tablet  0  . furosemide (LASIX) 20 MG tablet Take 1 tablet (20 mg total) by mouth as needed (weight 130 poounds or greater).  30 tablet  0  . levothyroxine (SYNTHROID, LEVOTHROID) 150 MCG tablet Take 1 tablet (150 mcg total) by mouth daily before breakfast.  30 tablet  0  . lisinopril (PRINIVIL,ZESTRIL) 20 MG tablet Take 1 tablet (20 mg total) by mouth daily.  30 tablet  0  . spironolactone (ALDACTONE) 25 MG tablet Take 1 tablet (25 mg total) by mouth daily.  30 tablet  0  . furosemide (LASIX) 20 MG tablet Take 20 mg by mouth See admin instructions. Take as needed if you gain 3 or more pounds (baseline 127)      . ibuprofen (ADVIL,MOTRIN) 100 MG tablet Take 100 mg by mouth every 8 (eight) hours as needed.      . mirtazapine (REMERON) 15 MG tablet Take 1 tablet (15 mg total) by mouth at bedtime.  30 tablet  1     PHYSICAL EXAM: Filed Vitals:   01/19/12 1030  BP: 102/78  Pulse: 109  Weight: 128 lb (58.06 kg)  SpO2: 99%   128 (discharge weight 128 pounds) General:  Cachetic . Chronically ill appearing. No resp difficulty (daughter and daughter in law present) HEENT: tracheostomy with pink peristoma Neck: supple. JVP flat. Carotids 2+ bilaterally; no bruits. No lymphadenopathy or thryomegaly appreciated. Cor: PMI normal. Regular rate & rhythm. No rubs, gallops or murmurs. Lungs: clear Abdomen: soft, nontender, nondistended. No hepatosplenomegaly. No bruits or masses. Good bowel sounds. Extremities: no cyanosis, clubbing, rash, edema Neuro: alert & orientedx3, cranial nerves grossly intact. Moves all 4 extremities w/o difficulty. Affect pleasant.      ASSESSMENT & PLAN:

## 2012-01-19 NOTE — Assessment & Plan Note (Addendum)
Volume status stable. Continue current diuretic regimen. If he has persistent dizziness he is instructed to cut lisinopril back to 10 mg daily. Will not titrate HF medication due soft BP. Follow up in 3 weeks.   Patient seen and examined with Tonye Becket, NP. We discussed all aspects of the encounter. I agree with the assessment and plan as stated above.  Volume status improved but overall he is still a bit tenuous. Can cut back lisinopril, if needed. Again stressed need for ETOH abstinence. Reinforced need for daily weights and reviewed use of sliding scale diuretics.

## 2012-01-25 ENCOUNTER — Encounter: Payer: Self-pay | Admitting: Family Medicine

## 2012-01-25 ENCOUNTER — Ambulatory Visit (INDEPENDENT_AMBULATORY_CARE_PROVIDER_SITE_OTHER): Payer: PRIVATE HEALTH INSURANCE | Admitting: Family Medicine

## 2012-01-25 DIAGNOSIS — R630 Anorexia: Secondary | ICD-10-CM

## 2012-01-25 MED ORDER — MEGESTROL ACETATE 400 MG/10ML PO SUSP: 400.0000 mg | Freq: Every day | ORAL | Status: DC

## 2012-01-25 NOTE — Progress Notes (Signed)
  Subjective:    [name], male    DOB: [birth year], 61 y.o.   MRN: [id number]  HPI  Patient presents to Nutrition clinic to discuss decreased appetite since stopping Megace due to fluid retention.  Remeron was started but patient says this made symptoms were.  He was seen at CHF clinic and they agreed to restart Megace and increase Lasix PRN.   Since then, patient's appetite has improved.  He can smell and taste certain foods and he is eating well.  Nutrition: Breakfast, Lunch, Dinner and one snack  24-hr: B ( 6 AM)- Eggs (2), grits (instant pack), toast (1 serving), baked chicken (1 serving)  Snk ( 10 AM)- Bologna sandwich & juice  L ( PM)- Baked chicken, rice, 1 piece of bread, bolonga  Snk ( PM)- Oatmeal cookie  D ( PM)- Ham sandwich, coffee, and 1 piece of chicken, green beans, mashed potatoes  Snk ( PM)- Oatmeal   Review of Systems  Per HPI    Objective:   Physical Exam  Weight: 130.3 lb      Assessment & Plan:   See Problem List.

## 2012-01-25 NOTE — Patient Instructions (Addendum)
Goals from today's visit: 1. Increase amount of green, leafy vegetables at both lunch and dinner 2. Cook your foods with garlic powder and onion powder 3. Do not cook with SALT.  This will worsen leg swelling.  Try purchasing MR. DASH. 4. Schedule follow up appointment with me in 2-3 months.

## 2012-01-25 NOTE — Assessment & Plan Note (Addendum)
Now improving after restarting Megace.  [name] appetite is back, he is eating well, and gaining weight.  Continue Megace and strict daily weights/routine appointments with CHF clinic. Met with Dr. Gerilyn Pilgrim today - discussed increasing caloric intake by using unsalted butter, mayonnaise. Avoiding salt and using MR. DASH, peppers, or garlic/onion powder instead.

## 2012-01-29 ENCOUNTER — Telehealth: Payer: Self-pay | Admitting: Family Medicine

## 2012-01-29 NOTE — Telephone Encounter (Signed)
Patient needs refills on Synthroid, Digoxin, Lisinopril, Spironolactone, Zebeta.  He has changed to a different pharmacy which is why the refill request is not coming from them.  He now uses [facility] on [address].  He is out of his Synthroid.

## 2012-01-30 MED ORDER — DIGOXIN 125 MCG PO TABS: 0.1250 mg | ORAL_TABLET | Freq: Every day | ORAL | Status: DC

## 2012-01-30 MED ORDER — LISINOPRIL 20 MG PO TABS: 20.0000 mg | ORAL_TABLET | Freq: Every day | ORAL | Status: DC

## 2012-01-30 MED ORDER — SPIRONOLACTONE 25 MG PO TABS: 25.0000 mg | ORAL_TABLET | Freq: Every day | ORAL | Status: DC

## 2012-01-30 MED ORDER — LEVOTHYROXINE SODIUM 150 MCG PO TABS: 150.0000 ug | ORAL_TABLET | Freq: Every day | ORAL | Status: DC

## 2012-01-30 MED ORDER — BISOPROLOL FUMARATE 5 MG PO TABS: 5.0000 mg | ORAL_TABLET | Freq: Every day | ORAL | Status: DC

## 2012-01-30 NOTE — Telephone Encounter (Signed)
[name], please notify Rx has been sent, but he needs an office visit and to have his thyroid re-checked within the next month because his thyroid tests were abnormal on 10/22 (they were low).  Thank you.

## 2012-02-12 ENCOUNTER — Ambulatory Visit (HOSPITAL_COMMUNITY): Payer: PRIVATE HEALTH INSURANCE | Attending: Internal Medicine

## 2012-02-15 ENCOUNTER — Encounter (HOSPITAL_COMMUNITY): Payer: Self-pay

## 2012-02-15 ENCOUNTER — Ambulatory Visit (HOSPITAL_COMMUNITY)
Admission: RE | Admit: 2012-02-15 | Discharge: 2012-02-15 | Disposition: A | Discharge status: Home / Self Care | Payer: PRIVATE HEALTH INSURANCE | Source: Ambulatory Visit | Attending: Internal Medicine | Admitting: Internal Medicine

## 2012-02-15 VITALS — BP 162/94 | HR 83 | Wt 135.8 lb

## 2012-02-15 DIAGNOSIS — I10 Essential (primary) hypertension: Secondary | ICD-10-CM

## 2012-02-15 DIAGNOSIS — I5022 Chronic systolic (congestive) heart failure: Secondary | ICD-10-CM

## 2012-02-15 MED ORDER — ISOSORBIDE MONONITRATE ER 30 MG PO TB24: 30.0000 mg | ORAL_TABLET | Freq: Every day | ORAL | Status: DC

## 2012-02-15 MED ORDER — HYDRALAZINE HCL 25 MG PO TABS: 25.0000 mg | ORAL_TABLET | Freq: Two times a day (BID) | ORAL | Status: DC

## 2012-02-15 NOTE — Progress Notes (Signed)
[name], male   DOB: [birth year], 61 y.o.   MRN: [id number]   Weight Range   Baseline proBNP     HPI: [name] is a 61 year old with a PMH of COPD, , chronic systolic heart failure (EF 30%), laryngeal cancer S/P laryngectomy (2009), HTN, hypothyroidism, cirrohsis (drinks 1/2 - 1  Bottle of liquor daily prior to hospital admit in October) and former tobacco (quit 2012). 12/24/11 ECHO EF 30%   12/29/11 D/C from [facility] A/C systolic heart failure. Bisoprolol 2.5 mg daily Spironolactone 25 mg daily Lasix 20 mg as needed if weight is 130 pounds or greater in 24 hour period Lisinopril 20 mg daily Digoxin 0.125 mg daily . Discharge weight 128 pounds.   12/29/11 RHC/LHC  RA = 1  RV = 16/0/2  PA = 16/6 (11)  PCW = 3  Fick cardiac output/index = 4.4/2.5  PVR = 1.7 Woods  FA sat = 99%  PA sat = 65%, 66%  Normal coronaries   He returns for follow up with his daughter.  Last visit lisinopril cut back to 10 mg daily due to dizziness but he continued on Lisinopril 20 mg daily and the dizziness has resolved.  PCP restarted Megace 2 weeks ago. Appetite has improved. Denies SOB/PND/Orthopnea. Weight at home increased to 135-136 pounds. He has not had any lasix. SBP has increased at home to > 130 during home health visits. Compliant with medications. He remains alcohol free.   ROS: All systems negative except as listed in HPI, PMH and Problem List.  Past Medical History  Diagnosis Date  . Hypertension   . Throat cancer   . Cirrhosis of liver   . Shortness of breath   . Arthritis     Current Outpatient Prescriptions  Medication Sig Dispense Refill  . acetaminophen (TYLENOL) 500 MG tablet Take 1 tablet (500 mg total) by mouth every 8 (eight) hours as needed for pain.  90 tablet  0  . bisoprolol (ZEBETA) 5 MG tablet Take 1 tablet (5 mg total) by mouth daily.  30 tablet  3  . diclofenac sodium (VOLTAREN) 1 % GEL Apply 2 g topically 4 (four) times daily as needed.  100 g  3  . digoxin  (LANOXIN) 0.125 MG tablet Take 1 tablet (0.125 mg total) by mouth daily.  30 tablet  0  . furosemide (LASIX) 20 MG tablet Take 1 tablet (20 mg total) by mouth as needed (weight 130 poounds or greater).  30 tablet  0  . furosemide (LASIX) 20 MG tablet Take 20 mg by mouth See admin instructions. Take as needed if you gain 3 or more pounds (baseline 127)      . ibuprofen (ADVIL,MOTRIN) 100 MG tablet Take 100 mg by mouth every 8 (eight) hours as needed.      Marland Kitchen levothyroxine (SYNTHROID, LEVOTHROID) 150 MCG tablet Take 1 tablet (150 mcg total) by mouth daily before breakfast.  30 tablet  0  . lisinopril (PRINIVIL,ZESTRIL) 20 MG tablet Take 1 tablet (20 mg total) by mouth daily.  30 tablet  0  . megestrol (MEGACE) 400 MG/10ML suspension Take 10 mLs (400 mg total) by mouth daily. Dispense enough for one month  240 mL  5  . spironolactone (ALDACTONE) 25 MG tablet Take 1 tablet (25 mg total) by mouth daily.  30 tablet  0     PHYSICAL EXAM: Filed Vitals:   02/15/12 1100  BP: 162/94  Pulse: 83  Weight: 135 lb 12.8 oz (  61.598 kg)  SpO2: 97%   Weight 128>138 pounds General:  Cachetic . Chronically ill appearing. No resp difficulty (daughter present) HEENT: tracheostomy with pink peristoma Neck: supple. JVP flat. Carotids 2+ bilaterally; no bruits. No lymphadenopathy or thryomegaly appreciated. Cor: PMI normal. Regular rate & rhythm. No rubs, gallops or murmurs. Lungs: clear Abdomen: soft, nontender, nondistended. No hepatosplenomegaly. No bruits or masses. Good bowel sounds. Extremities: no cyanosis, clubbing, rash, edema Neuro: alert & orientedx3, cranial nerves grossly intact. Moves all 4 extremities w/o difficulty. Affect pleasant.      ASSESSMENT & PLAN:

## 2012-02-15 NOTE — Assessment & Plan Note (Addendum)
NYHA II. Volume status stable despite weight gain likely due to addition of Megace. Add hydralazine 25 mg bid and not tid because he preferred bid for compliance. Also add IMDUR 30 mg daily. Reinforced daily weights, limiting fluid intake, alcohol abstinence, and low salt food choices. Will need to continue [facility] due medication titration. Follow up in 3 weeks. Consider increasing beta blocker at that time.

## 2012-02-15 NOTE — Assessment & Plan Note (Signed)
As noted hydralazine and IMDUR added today.

## 2012-02-15 NOTE — Patient Instructions (Addendum)
Take IMDUR 30 mg daily  Take Hydralazine 25 mg twice a day  Call us HF if your weight is 140 pounds or greater.    Do the following things EVERYDAY: 1) Weigh yourself in the morning before breakfast. Write it down and keep it in a log. 2) Take your medicines as prescribed 3) Eat low salt foods-Limit salt (sodium) to 2000 mg per day.  4) Stay as active as you can everyday 5) Limit all fluids for the day to less than 2 liters  Follow up in 3 weeks

## 2012-03-03 ENCOUNTER — Other Ambulatory Visit: Payer: Self-pay | Admitting: Family Medicine

## 2012-03-07 ENCOUNTER — Encounter (HOSPITAL_COMMUNITY): Payer: Self-pay

## 2012-03-07 ENCOUNTER — Ambulatory Visit (HOSPITAL_COMMUNITY)
Admission: RE | Admit: 2012-03-07 | Discharge: 2012-03-07 | Disposition: A | Discharge status: Home / Self Care | Payer: PRIVATE HEALTH INSURANCE | Source: Ambulatory Visit | Attending: Internal Medicine | Admitting: Internal Medicine

## 2012-03-07 VITALS — BP 154/82 | HR 83 | Wt 142.8 lb

## 2012-03-07 DIAGNOSIS — I10 Essential (primary) hypertension: Secondary | ICD-10-CM | POA: Insufficient documentation

## 2012-03-07 DIAGNOSIS — I5022 Chronic systolic (congestive) heart failure: Secondary | ICD-10-CM

## 2012-03-07 DIAGNOSIS — F1020 Alcohol dependence, uncomplicated: Secondary | ICD-10-CM | POA: Insufficient documentation

## 2012-03-07 LAB — BASIC METABOLIC PANEL
CO2: 21 mEq/L (ref 19–32)
Calcium: 9.5 mg/dL (ref 8.4–10.5)
Chloride: 106 mEq/L (ref 96–112)
Glucose, Bld: 83 mg/dL (ref 70–99)
Potassium: 4.1 mEq/L (ref 3.5–5.1)
Sodium: 139 mEq/L (ref 135–145)

## 2012-03-07 MED ORDER — HYDRALAZINE HCL 25 MG PO TABS: 50.0000 mg | ORAL_TABLET | Freq: Two times a day (BID) | ORAL | Status: DC

## 2012-03-07 NOTE — Patient Instructions (Addendum)
Increase hydralazine 50 mg (2 tabs) twice daily.  Take lasix for weight 138 pounds or more.    Follow up 3 weeks.

## 2012-03-07 NOTE — Progress Notes (Signed)
HPI: [name] is a 62 year old with a PMH of COPD, chronic systolic heart failure (EF 30%), laryngeal cancer S/P laryngectomy (2009), HTN, hypothyroidism, cirrohsis (drinks 1/2 - 1  Bottle of liquor daily prior to hospital admit in October) and former tobacco (quit 2012). 12/24/11 ECHO EF 30%   12/29/11 D/C from [facility] A/C systolic heart failure. Bisoprolol 2.5 mg daily Spironolactone 25 mg daily Lasix 20 mg as needed if weight is 130 pounds or greater in 24 hour period Lisinopril 20 mg daily Digoxin 0.125 mg daily . Discharge weight 128 pounds.   12/29/11 RHC/LHC  RA = 1  RV = 16/0/2  PA = 16/6 (11)  PCW = 3  Fick cardiac output/index = 4.4/2.5  PVR = 1.7 Woods  FA sat = 99%  PA sat = 65%, 66%  Normal coronaries   He returns for follow up today with his daughter.  Last visit hydralazine/imdur added.  He feels well.  His weight at home is 137-139 pounds.  Has mild HA since addition of imdur but it is not terrible.  He feels well at home and appetite is increasing.  He is able to walk up stairs without difficulty.  Denies dyspnea/orthopnea/PND.  Compliant with meds. He remains alcohol free.   ROS: All systems negative except as listed in HPI, PMH and Problem List.  Past Medical History  Diagnosis Date  . Hypertension   . Throat cancer   . Cirrhosis of liver   . Shortness of breath   . Arthritis     Current Outpatient Prescriptions  Medication Sig Dispense Refill  . acetaminophen (TYLENOL) 500 MG tablet Take 1 tablet (500 mg total) by mouth every 8 (eight) hours as needed for pain.  90 tablet  0  . bisoprolol (ZEBETA) 5 MG tablet Take 1 tablet (5 mg total) by mouth daily.  30 tablet  3  . diclofenac sodium (VOLTAREN) 1 % GEL Apply 2 g topically 4 (four) times daily as needed.  100 g  3  . digoxin (LANOXIN) 0.125 MG tablet Take 1 tablet (0.125 mg total) by mouth daily.  30 tablet  0  . furosemide (LASIX) 20 MG tablet Take 1 tablet (20 mg total) by mouth as needed (weight 130 poounds  or greater).  30 tablet  0  . furosemide (LASIX) 20 MG tablet Take 20 mg by mouth See admin instructions. Take as needed if you gain 3 or more pounds (baseline 127)      . hydrALAZINE (APRESOLINE) 25 MG tablet Take 1 tablet (25 mg total) by mouth 2 (two) times daily.  60 tablet  2  . ibuprofen (ADVIL,MOTRIN) 100 MG tablet Take 100 mg by mouth every 8 (eight) hours as needed.      . isosorbide mononitrate (IMDUR) 30 MG 24 hr tablet Take 1 tablet (30 mg total) by mouth daily.  30 tablet  3  . levothyroxine (SYNTHROID, LEVOTHROID) 150 MCG tablet TAKE 1 TABLET BY MOUTH EVERY DAY BEFORE BREAKFAST  30 tablet  0  . lisinopril (PRINIVIL,ZESTRIL) 20 MG tablet TAKE 1 TABLET BY MOUTH EVERY DAY  30 tablet  0  . megestrol (MEGACE) 400 MG/10ML suspension Take 10 mLs (400 mg total) by mouth daily. Dispense enough for one month  240 mL  5  . spironolactone (ALDACTONE) 25 MG tablet TAKE 1 TABLET BY MOUTH EVERY DAY  30 tablet  0     PHYSICAL EXAM: Filed Vitals:   03/07/12 1043  BP: 154/82  Pulse:  83  Weight: 142 lb 12.8 oz (64.774 kg)  SpO2: 94%   General:  Cachetic . Chronically ill appearing. No resp difficulty HEENT: tracheostomy with pink peristoma Neck: supple. JVP 7-8 . Carotids 2+ bilaterally; no bruits. No lymphadenopathy or thryomegaly appreciated. Cor: PMI normal. Regular rate & rhythm. No rubs, gallops or murmurs. Lungs: clear Abdomen: soft, nontender, mild distention. No hepatosplenomegaly. No bruits or masses. Good bowel sounds. Extremities: no cyanosis, clubbing, rash, edema Neuro: alert & orientedx3, cranial nerves grossly intact. Moves all 4 extremities w/o difficulty. Affect pleasant.      ASSESSMENT & PLAN:

## 2012-03-09 NOTE — Assessment & Plan Note (Signed)
Encouraged him to continue with abstinence.

## 2012-03-09 NOTE — Assessment & Plan Note (Signed)
As above, increase hydralazine 50 mg.

## 2012-03-09 NOTE — Assessment & Plan Note (Signed)
NYHA II.  Volume status mildly elevated.  Will have patient take lasix today and for weight 138 pounds or greater.  Have explained taking daily weights first thing in the morning after voiding as patient is weighing several times a day, the patient and his daughter voice understanding.  Will increase hydralazine 50 mg.  Will continue Imdur at this time as patient states he can handle the HA, have discussed adding tylenol before dose.

## 2012-03-19 ENCOUNTER — Telehealth: Payer: Self-pay | Admitting: Family Medicine

## 2012-03-19 NOTE — Telephone Encounter (Signed)
Medicaid needs another DX code to go along with Pleural Effusion in order to provide oxygen.

## 2012-03-20 ENCOUNTER — Telehealth (HOSPITAL_COMMUNITY): Payer: Self-pay | Admitting: Cardiology

## 2012-03-20 NOTE — Telephone Encounter (Signed)
Returned call to [facility],The nurse.  [name] weight today is 139 pounds and he ambulated up a flight of stairs without difficulty or SOB.  Therefore will change lasix to prn for weight 140 pounds or more.  Will draw BMET and BNP next week and fax to clinic.  She appreciated the call back.

## 2012-03-20 NOTE — Telephone Encounter (Signed)
[facility] nurse called with concerns about pts weight. Concerned if his weight is fluid gain vs body weight gain Currently order to take lasix if weight is over 137 previously order was for weight over 140  Weight has been steady between 137-139  Please advise

## 2012-03-21 NOTE — Telephone Encounter (Signed)
Contacted Blair at [facility] dx of laryngeal carcinoma 161.9.

## 2012-03-28 ENCOUNTER — Ambulatory Visit (HOSPITAL_COMMUNITY)
Admission: RE | Admit: 2012-03-28 | Discharge: 2012-03-28 | Disposition: A | Discharge status: Home / Self Care | Payer: PRIVATE HEALTH INSURANCE | Source: Ambulatory Visit | Attending: Internal Medicine | Admitting: Internal Medicine

## 2012-03-28 VITALS — BP 118/78 | HR 92 | Wt 140.5 lb

## 2012-03-28 DIAGNOSIS — I5022 Chronic systolic (congestive) heart failure: Secondary | ICD-10-CM | POA: Insufficient documentation

## 2012-03-28 MED ORDER — BISOPROLOL FUMARATE 5 MG PO TABS: 5.0000 mg | ORAL_TABLET | Freq: Two times a day (BID) | ORAL | Status: DC

## 2012-03-28 NOTE — Assessment & Plan Note (Signed)
Well controlled, continue current therapy. 

## 2012-03-28 NOTE — Assessment & Plan Note (Addendum)
Volume status well controlled with prn lasix for weight 140 pounds or more.  Will continue sliding scale.  Increase bisoprolol 5 mg BID.  Follow up 4-6 weeks with repeat echo.  Encouraged them to keep up the great work with daily weights and sodium restrictions.   Patient seen and examined with Ulyess Blossom, PA-C. We discussed all aspects of the encounter. I agree with the assessment and plan as stated above.  He is doing very well. Volume status looks good. Reinforced need for daily weights and reviewed use of sliding scale diuretics.Increase bisoprolol.  Will need f/u echo at next visit. If EF < 35% will need to discuss ICD.

## 2012-03-28 NOTE — Patient Instructions (Addendum)
Increase bisoprolol 5 mg twice daily.  Follow up 6 weeks with echo.

## 2012-03-28 NOTE — Progress Notes (Signed)
HPI: [name] is a 62 year old with a PMH of COPD, chronic systolic heart failure (EF 30%), laryngeal cancer S/P laryngectomy (2009), HTN, hypothyroidism, cirrohsis (drinks 1/2 - 1  Bottle of liquor daily prior to hospital admit in October) and former tobacco (quit 2012). 12/24/11 ECHO EF 30%   12/29/11 D/C from [facility] A/C systolic heart failure. Bisoprolol 2.5 mg daily Spironolactone 25 mg daily Lasix 20 mg as needed if weight is 130 pounds or greater in 24 hour period Lisinopril 20 mg daily Digoxin 0.125 mg daily . Discharge weight 128 pounds.   12/29/11 RHC/LHC  RA = 1  RV = 16/0/2  PA = 16/6 (11)  PCW = 3  Fick cardiac output/index = 4.4/2.5  PVR = 1.7 Woods  FA sat = 99%  PA sat = 65%, 66%  Normal coronaries   He returns for follow up today with his daughters.   Feels pretty good.  Weight at home is stable 137-138 pounds.  Only taking lasix as needed for weight 140 pounds or more.  Has only used 2 tablets in last 3 weeks.  Occ has dizziness in morning when he wakes up.  HA improving and now only every now and again.  Walking up steps without trouble.  He denies orthopnea/PND.  Compliant with meds.  He remains alcohol free.   ROS: All systems negative except as listed in HPI, PMH and Problem List.  Past Medical History  Diagnosis Date  . Hypertension   . Throat cancer   . Cirrhosis of liver   . Shortness of breath   . Arthritis     Current Outpatient Prescriptions  Medication Sig Dispense Refill  . acetaminophen (TYLENOL) 500 MG tablet Take 1 tablet (500 mg total) by mouth every 8 (eight) hours as needed for pain.  90 tablet  0  . bisoprolol (ZEBETA) 5 MG tablet Take 1 tablet (5 mg total) by mouth daily.  30 tablet  3  . diclofenac sodium (VOLTAREN) 1 % GEL Apply 2 g topically 4 (four) times daily as needed.  100 g  3  . digoxin (LANOXIN) 0.125 MG tablet Take 1 tablet (0.125 mg total) by mouth daily.  30 tablet  0  . furosemide (LASIX) 20 MG tablet Take 20 mg by mouth as needed.  As needed for weight over 140 lbs      . hydrALAZINE (APRESOLINE) 25 MG tablet Take 2 tablets (50 mg total) by mouth 2 (two) times daily.  60 tablet  2  . isosorbide mononitrate (IMDUR) 30 MG 24 hr tablet Take 1 tablet (30 mg total) by mouth daily.  30 tablet  3  . levothyroxine (SYNTHROID, LEVOTHROID) 150 MCG tablet TAKE 1 TABLET BY MOUTH EVERY DAY BEFORE BREAKFAST  30 tablet  0  . lisinopril (PRINIVIL,ZESTRIL) 20 MG tablet TAKE 1 TABLET BY MOUTH EVERY DAY  30 tablet  0  . megestrol (MEGACE) 400 MG/10ML suspension Take 10 mLs (400 mg total) by mouth daily. Dispense enough for one month  240 mL  5  . spironolactone (ALDACTONE) 25 MG tablet TAKE 1 TABLET BY MOUTH EVERY DAY  30 tablet  0  . furosemide (LASIX) 20 MG tablet Take 20 mg by mouth See admin instructions. Take as needed if you gain 3 or more pounds (baseline 127)         PHYSICAL EXAM: Filed Vitals:   03/28/12 0955  BP: 118/78  Pulse: 92  Weight: 140 lb 8 oz (63.73 kg)  SpO2: 99%  General:  Cachetic . Chronically ill appearing. No resp difficulty HEENT: tracheostomy with pink peristoma Neck: supple. JVP flat . Carotids 2+ bilaterally; no bruits. No lymphadenopathy or thryomegaly appreciated. Cor: PMI normal. Regular rate & rhythm. No rubs, gallops or murmurs. Lungs: clear Abdomen: obese, soft, nontender, nondistended. No hepatosplenomegaly. No bruits or masses. Good bowel sounds. Extremities: no cyanosis, clubbing, rash, edema Neuro: alert & orientedx3, cranial nerves grossly intact. Moves all 4 extremities w/o difficulty. Affect pleasant.      ASSESSMENT & PLAN:

## 2012-03-29 NOTE — Addendum Note (Signed)
Encounter addended by: Dolores Patty, MD on: 03/29/2012  7:28 PM<BR>     Documentation filed: Follow-up Section, LOS Section

## 2012-04-02 ENCOUNTER — Telehealth (HOSPITAL_COMMUNITY): Payer: Self-pay | Admitting: Cardiology

## 2012-04-02 MED ORDER — BISOPROLOL FUMARATE 5 MG PO TABS: 5.0000 mg | ORAL_TABLET | Freq: Every day | ORAL | Status: DC

## 2012-04-02 MED ORDER — SPIRONOLACTONE 25 MG PO TABS: 12.5000 mg | ORAL_TABLET | Freq: Every day | ORAL | Status: DC

## 2012-04-02 NOTE — Telephone Encounter (Signed)
Daughter called with concerns of decreased B/P x 2 days  95/58 98/77 89/65    W/o any meds Pt does have weakness and dizziness. A little off balance.  Please advise

## 2012-04-02 NOTE — Telephone Encounter (Signed)
Spoke w/pt's daughter, per Ulyess Blossom, [place] decrease Cleda Daub to 12.5 daily and Bisoprolol to 5 mg daily

## 2012-04-04 ENCOUNTER — Other Ambulatory Visit: Payer: Self-pay | Admitting: Family Medicine

## 2012-04-09 NOTE — Telephone Encounter (Signed)
Needs a letter faxed to them stating his need for oxygen - she said that they are not accepting just dx code - now they are asking for a letter

## 2012-04-17 ENCOUNTER — Encounter: Payer: Self-pay | Admitting: Internal Medicine

## 2012-05-09 ENCOUNTER — Ambulatory Visit (HOSPITAL_BASED_OUTPATIENT_CLINIC_OR_DEPARTMENT_OTHER)
Admission: RE | Admit: 2012-05-09 | Discharge: 2012-05-09 | Disposition: A | Discharge status: Home / Self Care | Payer: PRIVATE HEALTH INSURANCE | Source: Ambulatory Visit | Attending: Internal Medicine | Admitting: Internal Medicine

## 2012-05-09 ENCOUNTER — Encounter (HOSPITAL_COMMUNITY): Payer: Self-pay

## 2012-05-09 ENCOUNTER — Ambulatory Visit (HOSPITAL_COMMUNITY)
Admission: RE | Admit: 2012-05-09 | Discharge: 2012-05-09 | Disposition: A | Discharge status: Home / Self Care | Payer: PRIVATE HEALTH INSURANCE | Source: Ambulatory Visit | Attending: Internal Medicine | Admitting: Internal Medicine

## 2012-05-09 VITALS — BP 130/78 | HR 74 | Wt 141.8 lb

## 2012-05-09 DIAGNOSIS — I5022 Chronic systolic (congestive) heart failure: Secondary | ICD-10-CM | POA: Insufficient documentation

## 2012-05-09 DIAGNOSIS — I369 Nonrheumatic tricuspid valve disorder, unspecified: Secondary | ICD-10-CM

## 2012-05-09 DIAGNOSIS — F1020 Alcohol dependence, uncomplicated: Secondary | ICD-10-CM

## 2012-05-09 NOTE — Progress Notes (Signed)
[name], male   DOB: 03-21-1950, 62 y.o.   MRN: [id number]  HPI: Mr Primm is a 62 year old with a PMH of COPD, chronic systolic heart failure (EF 30%), laryngeal cancer S/P laryngectomy (2009), HTN, hypothyroidism, cirrohsis (drinks 1/2 - 1  Bottle of liquor daily prior to hospital admit in October) and former tobacco (quit 2012). 12/24/11 ECHO EF 30%   12/29/11 D/C from [facility] A/C systolic heart failure. Bisoprolol 2.5 mg daily Spironolactone 25 mg daily Lasix 20 mg as needed if weight is 130 pounds or greater in 24 hour period Lisinopril 20 mg daily Digoxin 0.125 mg daily . Discharge weight 128 pounds.   12/29/11 RHC/LHC  RA = 1  RV = 16/0/2  PA = 16/6 (11)  PCW = 3  Fick cardiac output/index = 4.4/2.5  PVR = 1.7 Woods  FA sat = 99%  PA sat = 65%, 66%  Normal coronaries   05/09/12 EF 55%   He returns for follow up today with his daughters.   Last visit bisoprolol 5 mg twice a day. Complains of dizziness. He has had some falls at home.  Weight at home 138-140 pounds. He has had one lasix over the last couple of weeks. Complaint with medications. He has started drinking alcohol again.     ROS: All systems negative except as listed in HPI, PMH and Problem List.  Past Medical History  Diagnosis Date  . Hypertension   . Throat cancer   . Cirrhosis of liver   . Shortness of breath   . Arthritis     Current Outpatient Prescriptions  Medication Sig Dispense Refill  . acetaminophen (TYLENOL) 500 MG tablet Take 1 tablet (500 mg total) by mouth every 8 (eight) hours as needed for pain.  90 tablet  0  . bisoprolol (ZEBETA) 5 MG tablet Take 1 tablet (5 mg total) by mouth daily.  60 tablet  2  . diclofenac sodium (VOLTAREN) 1 % GEL Apply 2 g topically 4 (four) times daily as needed.  100 g  3  . digoxin (LANOXIN) 0.125 MG tablet Take 1 tablet (0.125 mg total) by mouth daily.  30 tablet  0  . furosemide (LASIX) 20 MG tablet Take 20 mg by mouth as needed. As needed for weight  over 140 lbs      . hydrALAZINE (APRESOLINE) 25 MG tablet Take 2 tablets (50 mg total) by mouth 2 (two) times daily.  60 tablet  2  . isosorbide mononitrate (IMDUR) 30 MG 24 hr tablet Take 1 tablet (30 mg total) by mouth daily.  30 tablet  3  . levothyroxine (SYNTHROID, LEVOTHROID) 150 MCG tablet TAKE 1 TABLET BY MOUTH DAILY BEFORE BREAKFAST  30 tablet  0  . lisinopril (PRINIVIL,ZESTRIL) 20 MG tablet TAKE 1 TABLET BY MOUTH DAILY  30 tablet  0  . megestrol (MEGACE) 400 MG/10ML suspension Take 10 mLs (400 mg total) by mouth daily. Dispense enough for one month  240 mL  5  . spironolactone (ALDACTONE) 25 MG tablet Take 0.5 tablets (12.5 mg total) by mouth daily.  30 tablet  0  . spironolactone (ALDACTONE) 25 MG tablet TAKE 1 TABLET BY MOUTH DAILY  30 tablet  0  . furosemide (LASIX) 20 MG tablet Take 20 mg by mouth See admin instructions. Take as needed if you gain 3 or more pounds (baseline 127)       No current facility-administered medications for this encounter.     PHYSICAL EXAM:  Filed Vitals:   05/09/12 1054  BP: 130/78  Pulse: 74  Weight: 141 lb 12.8 oz (64.32 kg)  SpO2: 96%   General:  Cachetic . Chronically ill appearing. No resp difficulty HEENT: tracheostomy with pink peristoma Neck: supple. JVP flat . Carotids 2+ bilaterally; no bruits. No lymphadenopathy or thryomegaly appreciated. Cor: PMI normal. Regular rate & rhythm. No rubs, gallops or murmurs. Lungs: clear Abdomen: obese, soft, nontender, nondistended. No hepatosplenomegaly. No bruits or masses. Good bowel sounds. Extremities: no cyanosis, clubbing, rash, edema Neuro: alert & orientedx3, cranial nerves grossly intact. Moves all 4 extremities w/o difficulty. Affect pleasant.      ASSESSMENT & PLAN:

## 2012-05-09 NOTE — Progress Notes (Signed)
   Echocardiogram 2D Echocardiogram has been performed.  Ellender Hose A 05/09/2012, 10:50 AM

## 2012-05-09 NOTE — Assessment & Plan Note (Addendum)
He has resumed alcohol again. Reinforced alcohol cessation.   Patient seen and examined with Tonye Becket, NP. We discussed all aspects of the encounter. I agree with the assessment and plan as stated above.  Agree. See above.

## 2012-05-09 NOTE — Assessment & Plan Note (Addendum)
NYHA I. Volume status stable. ECHO discussed and reviewed during OV by Dr Gala Romney. EF recovery noted. Stop digoxin. Will cut back bisoprolol to 5 mg daily. He is instructed to only take Lasix if his weight is 145 pounds or greater. Follow up in 4 months.   Patient seen and examined with Tonye Becket, NP. We discussed all aspects of the encounter. I agree with the assessment and plan as stated above. Echo reviewed personally in clinic with him and his daughter. EF now normalized. Will stop dig. Cut back bisoprolol due to low BP. Counseled extensively on need to abstain completely from ETOH to prevent recurrence of cardiomyopathy.

## 2012-05-09 NOTE — Patient Instructions (Addendum)
Only take Lasix if your weight is 145 pounds or greater.   Stop digoxin  Take bisoprolol 5 mg daily  Do the following things EVERYDAY: 1) Weigh yourself in the morning before breakfast. Write it down and keep it in a log. 2) Take your medicines as prescribed 3) Eat low salt foods-Limit salt (sodium) to 2000 mg per day.  4) Stay as active as you can everyday 5) Limit all fluids for the day to less than 2 liters  Follow up in 4 months

## 2012-05-16 ENCOUNTER — Other Ambulatory Visit: Payer: Self-pay | Admitting: Family Medicine

## 2012-05-29 ENCOUNTER — Encounter: Payer: Self-pay | Admitting: Family Medicine

## 2012-06-03 ENCOUNTER — Encounter (HOSPITAL_COMMUNITY): Payer: PRIVATE HEALTH INSURANCE

## 2012-06-24 ENCOUNTER — Encounter: Payer: Self-pay | Admitting: Family Medicine

## 2012-06-24 DIAGNOSIS — C329 Malignant neoplasm of larynx, unspecified: Secondary | ICD-10-CM

## 2012-10-03 ENCOUNTER — Other Ambulatory Visit: Payer: Self-pay | Admitting: Family Medicine

## 2012-10-04 ENCOUNTER — Ambulatory Visit (HOSPITAL_COMMUNITY): Payer: PRIVATE HEALTH INSURANCE | Attending: Internal Medicine

## 2012-11-07 ENCOUNTER — Telehealth: Payer: Self-pay | Admitting: Family Medicine

## 2012-11-07 NOTE — Telephone Encounter (Signed)
Daughter called because her fathers prescription are not be filled. The pharmacy has faxed [place] request but we not responded. He has not seen his new doctor yet and does have an appointment 9/25. Her father has been out of megestrol for a month and it is very important that he has this. He is also low on other medications but she wasn't sure which were out of refills. She would like refill on all medications to hold him until his appointment 9/25. She would like them sent to the [facility] on [address] and [address]. JW

## 2012-11-08 ENCOUNTER — Other Ambulatory Visit: Payer: Self-pay | Admitting: Family Medicine

## 2012-11-08 MED ORDER — MEGESTROL ACETATE 400 MG/10ML PO SUSP: 400.0000 mg | Freq: Every day | ORAL | Status: DC

## 2012-11-08 NOTE — Telephone Encounter (Signed)
I have called in his megace, however I have never received a prescription request for this patient prior. If he needs other medicines refilled prior to his appointment have his pharmacy send an e-request and I would be happy to bridge him until his appointment. Thanks.

## 2012-11-08 NOTE — Telephone Encounter (Signed)
Spoke with patient 's daughter(shannon) and informed her of below

## 2012-11-25 ENCOUNTER — Ambulatory Visit (HOSPITAL_COMMUNITY)
Admission: RE | Admit: 2012-11-25 | Discharge: 2012-11-25 | Disposition: A | Discharge status: Home / Self Care | Payer: PRIVATE HEALTH INSURANCE | Source: Ambulatory Visit | Attending: Internal Medicine | Admitting: Internal Medicine

## 2012-11-25 ENCOUNTER — Encounter (HOSPITAL_COMMUNITY): Payer: Self-pay

## 2012-11-25 VITALS — BP 215/118 | HR 84 | Wt 150.0 lb

## 2012-11-25 DIAGNOSIS — I5022 Chronic systolic (congestive) heart failure: Secondary | ICD-10-CM | POA: Insufficient documentation

## 2012-11-25 DIAGNOSIS — I10 Essential (primary) hypertension: Secondary | ICD-10-CM | POA: Insufficient documentation

## 2012-11-25 LAB — BASIC METABOLIC PANEL
CO2: 20 mEq/L (ref 19–32)
Chloride: 102 mEq/L (ref 96–112)
GFR calc non Af Amer: 47 mL/min — ABNORMAL LOW (ref >90)
Glucose, Bld: 97 mg/dL (ref 70–99)
Potassium: 4.1 mEq/L (ref 3.5–5.1)
Sodium: 135 mEq/L (ref 135–145)

## 2012-11-25 MED ORDER — SPIRONOLACTONE 25 MG PO TABS: 25.0000 mg | ORAL_TABLET | Freq: Every day | ORAL | Status: DC

## 2012-11-25 MED ORDER — ISOSORBIDE MONONITRATE ER 30 MG PO TB24: 30.0000 mg | ORAL_TABLET | Freq: Every day | ORAL | Status: DC

## 2012-11-25 MED ORDER — BISOPROLOL FUMARATE 10 MG PO TABS: 10.0000 mg | ORAL_TABLET | Freq: Every day | ORAL | Status: DC

## 2012-11-25 MED ORDER — AMLODIPINE BESYLATE 5 MG PO TABS: 5.0000 mg | ORAL_TABLET | Freq: Every day | ORAL | Status: DC

## 2012-11-25 MED ORDER — LISINOPRIL 20 MG PO TABS: 20.0000 mg | ORAL_TABLET | Freq: Every day | ORAL | Status: DC

## 2012-11-25 MED ORDER — HYDRALAZINE HCL 25 MG PO TABS: 50.0000 mg | ORAL_TABLET | Freq: Two times a day (BID) | ORAL | Status: DC

## 2012-11-25 NOTE — Patient Instructions (Addendum)
Start amlodipine 5 mg daily.  Increase bisoprolol to 10 mg daily. Call if notice increase dizziness or blood pressure dropping.  Sent in refills for medications.  Keep blood pressure log and bring to next visit.  Follow up in 2 months.   Do the following things EVERYDAY: 1) Weigh yourself in the morning before breakfast. Write it down and keep it in a log. 2) Take your medicines as prescribed 3) Eat low salt foods-Limit salt (sodium) to 2000 mg per day.  4) Stay as active as you can everyday 5) Limit all fluids for the day to less than 2 liters

## 2012-11-25 NOTE — Progress Notes (Addendum)
Patient ID: OWENN ROTHERMEL, male   DOB: March 17, 1950, 62 y.o.   MRN: [id number]  HPI: Mr Schrecengost is a 62 year old with a PMH of COPD, chronic systolic heart failure (EF 30%), laryngeal cancer S/P laryngectomy (2009), HTN, hypothyroidism, cirrohsis (drinks 1/2 - 1  Bottle of liquor daily prior to hospital admit in October) and former tobacco (quit 2012).    12/29/11 D/C from [facility] A/C systolic heart failure. Bisoprolol 2.5 mg daily Spironolactone 25 mg daily Lasix 20 mg as needed if weight is 130 pounds or greater in 24 hour period Lisinopril 20 mg daily Digoxin 0.125 mg daily . Discharge weight 128 pounds.   12/29/11 RHC/LHC  RA = 1  RV = 16/0/2  PA = 16/6 (11)  PCW = 3  Fick cardiac output/index = 4.4/2.5  PVR = 1.7 Woods  FA sat = 99%  PA sat = 65%, 66%  Normal coronaries   ECHO 12/24/11 EF 30%               05/09/12   EF 55%  Follow up: Here with daughters. Last visit stopped digoxin and cut bisoprolol back to 5 mg daily. Has been out of IMDUR 30 mg for a month. Takes 20 mg lasix as needed. Denies SOB, orthopnea, edema or CP. Walking 15-20 min daily. Does not take meds as prescribed especially when drinking, but says he took all his meds today. Following low salt diet. Dizziness has improved. Weight at home 148-150 lbs. Still drinking everyday hard liquor, drinks heavily between 3rd-10th month when check comes in.   SH: Lives with two daughters on disability.     ROS: All systems negative except as listed in HPI, PMH and Problem List.  Past Medical History  Diagnosis Date  . Hypertension   . Throat cancer   . Cirrhosis of liver   . Shortness of breath   . Arthritis     Current Outpatient Prescriptions  Medication Sig Dispense Refill  . acetaminophen (TYLENOL) 500 MG tablet Take 1 tablet (500 mg total) by mouth every 8 (eight) hours as needed for pain.  90 tablet  0  . bisoprolol (ZEBETA) 5 MG tablet Take 1 tablet (5 mg total) by mouth daily.  60 tablet  2  . diclofenac sodium  (VOLTAREN) 1 % GEL Apply 2 g topically 4 (four) times daily as needed.  100 g  3  . hydrALAZINE (APRESOLINE) 25 MG tablet Take 2 tablets (50 mg total) by mouth 2 (two) times daily.  60 tablet  2  . levothyroxine (SYNTHROID, LEVOTHROID) 150 MCG tablet TAKE 1 TABLET DAILY BEFORE BREAKFAST  30 tablet  0  . lisinopril (PRINIVIL,ZESTRIL) 20 MG tablet TAKE 1 TABLET BY MOUTH DAILY  30 tablet  0  . megestrol (MEGACE) 400 MG/10ML suspension Take 10 mLs (400 mg total) by mouth daily. Dispense enough for one month  240 mL  5  . spironolactone (ALDACTONE) 25 MG tablet TAKE 1 TABLET BY MOUTH DAILY  30 tablet  0  . furosemide (LASIX) 20 MG tablet Take 20 mg by mouth See admin instructions. Take as needed if you gain 3 or more pounds (baseline 127)      . furosemide (LASIX) 20 MG tablet Take 20 mg by mouth as needed. As needed for weight over 140 lbs      . isosorbide mononitrate (IMDUR) 30 MG 24 hr tablet Take 1 tablet (30 mg total) by mouth daily.  30 tablet  3   No  current facility-administered medications for this encounter.    Filed Vitals:   11/25/12 1049  BP: 196/108  Pulse: 84  Weight: 150 lb (68.04 kg)  SpO2: 98%    PHYSICAL EXAM: General:  Cachetic . Chronically ill appearing. No resp difficulty; daughters present HEENT: tracheostomy with pink peristoma Neck: supple. JVP flat . Carotids 2+ bilaterally; no bruits. No lymphadenopathy or thryomegaly appreciated. Cor: PMI normal. Regular rate & rhythm. No rubs, gallops or murmurs. Lungs: clear Abdomen: obese, soft, nontender, nondistended. No hepatosplenomegaly. No bruits or masses. Good bowel sounds. Extremities: no cyanosis, clubbing, rash, edema Neuro: alert & orientedx3, cranial nerves grossly intact. Moves all 4 extremities w/o difficulty. Affect pleasant.   ASSESSMENT & PLAN:  1) Chronic systolic HF: NICM, likely related to ETOH. EF improved to 55% (05/2012).  NYHA I-II symptoms. Volume status good. EF improved. - Continues to drink  heavily and does not always take medications => I strongly encouraged him to cut back on ETOH.  - Reinforced the need and importance of daily weights, a low sodium diet, and fluid restriction (less than 2 L a day). Instructed to call the HF clinic if weight increases more than 3 lbs overnight or 5 lbs in a week. - BMET today  2) HTN: Uncontrolled.  He says he has been taking all his medicine.  - Will send refills for all medications. - Start amlodipine 5 mg daily and increase bisoprolol to 10 mg daily - Keep BP log at home and follow up here in 2 weeks. - Call any dizziness or severe BP drops, SBP less than 110   Ulla Potash B NP-C 11:29 AM  Patient seen with NP, agree with the above note.  Patient continues to drink heavily.  EF was improved on last echo to 55%.  However, his BP is uncontrolled.  Will add amlodipine and increase bisoprolol as above.  Needs to cut back on ETOH.   Marca Ancona 11/25/2012

## 2012-11-27 ENCOUNTER — Telehealth (HOSPITAL_COMMUNITY): Payer: Self-pay | Admitting: *Deleted

## 2012-11-27 MED ORDER — AMLODIPINE BESYLATE 5 MG PO TABS: 5.0000 mg | ORAL_TABLET | Freq: Every day | ORAL | Status: DC

## 2012-11-27 MED ORDER — ISOSORBIDE MONONITRATE ER 30 MG PO TB24: 30.0000 mg | ORAL_TABLET | Freq: Every day | ORAL | Status: DC

## 2012-11-27 MED ORDER — SPIRONOLACTONE 25 MG PO TABS: 12.5000 mg | ORAL_TABLET | Freq: Every day | ORAL | Status: DC

## 2012-11-27 NOTE — Telephone Encounter (Signed)
Spoke with patient and he is aware to decrease spiro.  We will check labs again in 2 weeks.  Rx sent in.

## 2012-11-28 ENCOUNTER — Encounter: Payer: Self-pay | Admitting: Family Medicine

## 2012-11-28 ENCOUNTER — Ambulatory Visit (INDEPENDENT_AMBULATORY_CARE_PROVIDER_SITE_OTHER): Payer: PRIVATE HEALTH INSURANCE | Admitting: Family Medicine

## 2012-11-28 ENCOUNTER — Telehealth: Payer: Self-pay | Admitting: Family Medicine

## 2012-11-28 VITALS — BP 124/76 | HR 80 | Ht 71.0 in | Wt 144.0 lb

## 2012-11-28 DIAGNOSIS — M25529 Pain in unspecified elbow: Secondary | ICD-10-CM | POA: Insufficient documentation

## 2012-11-28 DIAGNOSIS — I5021 Acute systolic (congestive) heart failure: Secondary | ICD-10-CM

## 2012-11-28 DIAGNOSIS — R630 Anorexia: Secondary | ICD-10-CM

## 2012-11-28 DIAGNOSIS — R7989 Other specified abnormal findings of blood chemistry: Secondary | ICD-10-CM | POA: Insufficient documentation

## 2012-11-28 DIAGNOSIS — R799 Abnormal finding of blood chemistry, unspecified: Secondary | ICD-10-CM

## 2012-11-28 DIAGNOSIS — M25521 Pain in right elbow: Secondary | ICD-10-CM

## 2012-11-28 DIAGNOSIS — I10 Essential (primary) hypertension: Secondary | ICD-10-CM

## 2012-11-28 DIAGNOSIS — E039 Hypothyroidism, unspecified: Secondary | ICD-10-CM

## 2012-11-28 MED ORDER — TRAMADOL HCL 50 MG PO TABS: 50.0000 mg | ORAL_TABLET | Freq: Three times a day (TID) | ORAL | Status: DC | PRN

## 2012-11-28 MED ORDER — LEVOTHYROXINE SODIUM 150 MCG PO TABS: 150.0000 ug | ORAL_TABLET | Freq: Every day | ORAL | Status: DC

## 2012-11-28 NOTE — Assessment & Plan Note (Signed)
Patient has been using Megace for weight control. It appears it has caused him some fluid retention in the past but better Remeron. At this time will continue Megace use with caution of fluid retention in a patient with systolic heart failure and reduced ejection fraction.

## 2012-11-28 NOTE — Assessment & Plan Note (Signed)
Ordered routine labs today including TSH. Patient will have them drawn Tuesday morning after fasting so that we can also obtain a cholesterol profile. I will call him with results. I refilled his Synthroid dose today

## 2012-11-28 NOTE — Assessment & Plan Note (Signed)
Data recent laboratories completed in CHF clinic with increasing creatinine to 1.5. He has had elevated creatinines in the past, but prior creatinine 8 months ago was normal. Possibly could be acute kidney injury from severe hypertension and/or dehydration. Will follow closely with repeat lab work next week.

## 2012-11-28 NOTE — Assessment & Plan Note (Addendum)
I am concerned this may be nerve damage from prior surgery. Question possible scar formation. Unlikely extension of cancerous lesion considering he has had good followup with surgeon. However I will order imaging study today on shoulder in neck. At this time I have chosen to give him a one-time prescription of tramadol. I will follow closely. Difficult to assess since he was not in pain today. NSAIDs not ideal in this patient considering he's had a recent bump in creatinine to 1.5. Followup in 2-3 months

## 2012-11-28 NOTE — Telephone Encounter (Signed)
Please call daughter [name] and explain to her I have decided to go ahead and get imagining studies of her fathers neck and shoulder. I have placed these orders for future orders, so she can get them Tuesday, if she desires, when she gets his lab work drawn. The xrays will need to be completed at [facility].

## 2012-11-28 NOTE — Progress Notes (Signed)
Subjective:     [name], male   DOB: 11-28-1950, 62 y.o.   MRN: [id number]  HPI [name] is a 62 year old with a PMH of COPD, chronic systolic heart failure (EF 30%), laryngeal cancer S/P laryngectomy (2009), HTN, hypothyroidism, cirrohsis (drinks 1/2 - 1 Bottle of liquor daily prior to hospital admit in October) and former tobacco (quit 2012).  12/29/11 D/C from [facility] A/C systolic heart failure.   Hypothyroid: Patient has a history of hypothyroidism. Doesn't appear that his TSH has been checked in over a year. Patient has been lost to followup for some time. He does not report signs or symptoms of hypothyroidism, increased fatigue, dry skin, memory loss, confusion or unintentional weight gain.  Weight loss: Patient with pharyngeal cancer status post laryngectomy in 2009 with weight loss at that time. Patient has been on Megace and since has gained weight currently dry weight about 150 pounds. He should call his up with Dr. Earlene Plater for laryngeal cancer he's had resection and currently has a stoma. He is unable to speak, but can word/move lips with enough soft speech to understand him. He is with his daughter today, [name]. Patient  desires to continue Megace use.  Right arm pain: Patient complains of right arm weakness, mild pain that is throbbing in nature all the way down his arm to his fingertips which he feels a tingling sensation. This has been going on intermittently for 4-6 months. He has been told prior that this was likely due to arthritis. Has no trauma to his neck arm or shoulder prior. Has had surgery in the neck area for laryngeal cancer. He follows up with Dr. Earlene Plater for his cancer. He endorses the arthritis pain in his arm from prior starting after his neck surgery, has become increasingly worse over the past for 6 months. Takes Tylenol on occasion when needed but does not this controls the pain. He used to be a heavy drinker, Tylenol might not be the best option for  him. He also has a bump in his creatinine since last year at this time, so long-term NSAIDs are also not ideal. He currently is not in pain during this visit.  HF R. EF: Issue with history of chronic systolic heart failure with ejection fraction of 30%. Patient was also  lost to followup in the CHF clinic. He has been able to reestablish this was passed Monday with his cardiologist in the CHF clinic. In which he had elevated blood pressures. At that time they started him on Norvasc in addition to his Aldactone, bisoprolol, hydralazine, Imdur and lisinopril. He takes Lasix 20 mg when necessary for weight above 150.   [name] past medical, social, and family history were reviewed and updated as appropriate. Review of Systems Negative, with the exception of above mentioned in HPI     Objective:   Physical Exam BP 124/76  Pulse 80  Ht 5\' 11"  (1.803 m)  Wt 144 lb (65.318 kg)  BMI 20.09 kg/m2 Gen: NAD. Pleasant gentleman here with his daughter [name]. Notably his weight is down today to 144 per our office scale.  CV: RRR  Chest: CTAB, no wheeze or crackles Abd: Soft. NTND. BS present. No Masses palpated.  Ext: No erythema. No edema.  Skin: No rashes, purpura or petechiae.  Neuro: Normal gait. PERLA. EOMi. Alert. Grossly intact.    Next visit: Repeat creatinine

## 2012-11-28 NOTE — Patient Instructions (Addendum)
It was a pleasure meeting you both of you today . I will refill his thyroid medication and his Megace today. I will look into possible medications for him to take for his arthritis pain. I want to make sure it is safe with his elevated kidney function.  Like him to get his cholesterol and routine lab work completed this coming week. He will need to be fasting for this so please make a lab appointment on the morning that he will be able to eat nothing after midnight. Followup the CHF clinic as needed for her cardiac followup.  Degenerative Arthritis You have osteoarthritis. This is the wear and tear arthritis that comes with aging. It is also called degenerative arthritis. This is common in people past middle age. It is caused by stress on the joints. The large weight bearing joints of the lower extremities are most often affected. The knees, hips, back, neck, and hands can become painful, swollen, and stiff. This is the most common type of arthritis. It comes on with age, carrying too much weight, or from an injury. Treatment includes resting the sore joint until the pain and swelling improve. Crutches or a walker may be needed for severe flares. Only take over-the-counter or prescription medicines for pain, discomfort, or fever as directed by your caregiver. Local heat therapy may improve motion. Cortisone shots into the joint are sometimes used to reduce pain and swelling during flares. Osteoarthritis is usually not crippling and progresses slowly. There are things you can do to decrease pain:  Avoid high impact activities.  Exercise regularly.  Low impact exercises such as walking, biking and swimming help to keep the muscles strong and keep normal joint function.  Stretching helps to keep your range of motion.  Lose weight if you are overweight. This reduces joint stress. In severe cases when you have pain at rest or increasing disability, joint surgery may be helpful. See your caregiver for  follow-up treatment as recommended.  SEEK IMMEDIATE MEDICAL CARE IF:   You have severe joint pain.  Marked swelling and redness in your joint develops.  You develop a high fever. Document Released: 02/20/2005 Document Revised: 05/15/2011 Document Reviewed: 07/23/2006 [facility] Patient Information 2014 [place], [place].

## 2012-11-28 NOTE — Assessment & Plan Note (Addendum)
Patient recently reestablish this week with CHF clinic. At this time they added Norvasc to control his blood pressure. His blood pressures are good today 124/76 with a heart rate of 80. Patient is currently taking lisinopril, Aldactone, hydralazine, Imdur, bisoprolol  and Norvasc for his blood pressure. He is prescribed Lasix when necessary for weight above 150.

## 2012-11-28 NOTE — Assessment & Plan Note (Signed)
Patient seen in the CHF clinic. Blood pressure at goal today with 124/76

## 2012-11-29 NOTE — Telephone Encounter (Signed)
Related message patient daughter Carollee Herter voiced understanding.Veera Stapleton, Virgel Bouquet

## 2012-12-03 ENCOUNTER — Other Ambulatory Visit: Payer: PRIVATE HEALTH INSURANCE

## 2012-12-04 ENCOUNTER — Encounter: Payer: Self-pay | Admitting: Family Medicine

## 2012-12-04 ENCOUNTER — Other Ambulatory Visit: Payer: Medicare Other

## 2012-12-04 ENCOUNTER — Ambulatory Visit (HOSPITAL_COMMUNITY)
Admission: RE | Admit: 2012-12-04 | Discharge: 2012-12-04 | Disposition: A | Discharge status: Home / Self Care | Payer: PRIVATE HEALTH INSURANCE | Source: Ambulatory Visit | Attending: Family Medicine | Admitting: Family Medicine

## 2012-12-04 DIAGNOSIS — I10 Essential (primary) hypertension: Secondary | ICD-10-CM

## 2012-12-04 DIAGNOSIS — M25521 Pain in right elbow: Secondary | ICD-10-CM

## 2012-12-04 DIAGNOSIS — M47812 Spondylosis without myelopathy or radiculopathy, cervical region: Secondary | ICD-10-CM | POA: Insufficient documentation

## 2012-12-04 DIAGNOSIS — M25519 Pain in unspecified shoulder: Secondary | ICD-10-CM | POA: Insufficient documentation

## 2012-12-04 DIAGNOSIS — C329 Malignant neoplasm of larynx, unspecified: Secondary | ICD-10-CM | POA: Insufficient documentation

## 2012-12-04 LAB — CBC WITH DIFFERENTIAL/PLATELET
Basophils Absolute: 0 10*3/uL (ref 0.0–0.1)
Basophils Relative: 1 % (ref 0–1)
Eosinophils Absolute: 0.1 10*3/uL (ref 0.0–0.7)
Hemoglobin: 12.4 g/dL — ABNORMAL LOW (ref 13.0–17.0)
MCH: 30.2 pg (ref 26.0–34.0)
MCHC: 34.2 g/dL (ref 30.0–36.0)
Neutro Abs: 3.2 10*3/uL (ref 1.7–7.7)
Neutrophils Relative %: 48 % (ref 43–77)
Platelets: 329 10*3/uL (ref 150–400)
RDW: 16.1 % — ABNORMAL HIGH (ref 11.5–15.5)
WBC: 6.7 10*3/uL (ref 4.0–10.5)

## 2012-12-04 LAB — COMPREHENSIVE METABOLIC PANEL
AST: 25 U/L (ref 0–37)
Albumin: 4 g/dL (ref 3.5–5.2)
Alkaline Phosphatase: 76 U/L (ref 39–117)
BUN: 19 mg/dL (ref 6–23)
Glucose, Bld: 74 mg/dL (ref 70–99)
Potassium: 4.1 mEq/L (ref 3.5–5.3)
Sodium: 134 mEq/L — ABNORMAL LOW (ref 135–145)
Total Bilirubin: 0.6 mg/dL (ref 0.3–1.2)
Total Protein: 8.3 g/dL (ref 6.0–8.3)

## 2012-12-04 LAB — LIPID PANEL
Cholesterol: 166 mg/dL (ref 0–200)
HDL: 35 mg/dL — ABNORMAL LOW (ref >39)
LDL Cholesterol: 115 mg/dL — ABNORMAL HIGH (ref 0–99)
Total CHOL/HDL Ratio: 4.7 Ratio
Triglycerides: 78 mg/dL (ref <150)
VLDL: 16 mg/dL (ref 0–40)

## 2012-12-04 NOTE — Progress Notes (Signed)
Drew DMET, CBC/DIFF, LIPID, TSH   BAJORDAN, MLS

## 2012-12-06 ENCOUNTER — Encounter (HOSPITAL_COMMUNITY): Payer: Self-pay

## 2012-12-06 ENCOUNTER — Other Ambulatory Visit: Payer: Self-pay | Admitting: Family Medicine

## 2012-12-06 ENCOUNTER — Encounter: Payer: Self-pay | Admitting: Family Medicine

## 2012-12-06 ENCOUNTER — Ambulatory Visit (HOSPITAL_COMMUNITY)
Admission: RE | Admit: 2012-12-06 | Discharge: 2012-12-06 | Disposition: A | Discharge status: Home / Self Care | Payer: PRIVATE HEALTH INSURANCE | Source: Ambulatory Visit | Attending: Internal Medicine | Admitting: Internal Medicine

## 2012-12-06 ENCOUNTER — Telehealth: Payer: Self-pay | Admitting: Family Medicine

## 2012-12-06 VITALS — BP 122/70 | HR 88 | Wt 146.8 lb

## 2012-12-06 DIAGNOSIS — F1020 Alcohol dependence, uncomplicated: Secondary | ICD-10-CM

## 2012-12-06 DIAGNOSIS — I10 Essential (primary) hypertension: Secondary | ICD-10-CM

## 2012-12-06 DIAGNOSIS — I5022 Chronic systolic (congestive) heart failure: Secondary | ICD-10-CM

## 2012-12-06 MED ORDER — LEVOTHYROXINE SODIUM 200 MCG PO TABS: 200.0000 ug | ORAL_TABLET | Freq: Every day | ORAL | Status: DC

## 2012-12-06 MED ORDER — BISOPROLOL FUMARATE 10 MG PO TABS: 10.0000 mg | ORAL_TABLET | Freq: Every day | ORAL | Status: DC

## 2012-12-06 MED ORDER — ATORVASTATIN CALCIUM 20 MG PO TABS: 20.0000 mg | ORAL_TABLET | Freq: Every day | ORAL | Status: DC

## 2012-12-06 NOTE — Progress Notes (Signed)
[name], male   DOB: 1950-05-10, 62 y.o.   MRN: [id number]  HPI: Mr [name] is a 62 year old with a PMH of COPD, chronic systolic heart failure (EF 30%), laryngeal cancer S/P laryngectomy (2009), HTN, hypothyroidism, cirrohsis (drinks 1/2 - 1  Bottle of liquor daily prior to hospital admit in October) and former tobacco (quit 2012).    12/29/11 D/C from [facility] systolic heart failure. Bisoprolol 2.5 mg daily Spironolactone 25 mg daily Lasix 20 mg as needed if weight is 130 pounds or greater in 24 hour period Lisinopril 20 mg daily Digoxin 0.125 mg daily . Discharge weight 128 pounds.   12/29/11 RHC/LHC  RA = 1  RV = 16/0/2  PA = 16/6 (11)  PCW = 3  Fick cardiac output/index = 4.4/2.5  PVR = 1.7 Woods  FA sat = 99%  PA sat = 65%, 66%  Normal coronaries   ECHO 12/24/11 EF 30%               05/09/12   EF 55%  Labs 11/25/12 Creatinine 1.5 K 4.1  Labs 12/04/12 Creatinine 1.1 K 4.1   He returns for follow up. Last visit amlodipine 5 mg daily added and  bisoprolol increased to 10 mg daily. BP at home much improved. SBP at home down to 130s after amlodipine added.Also spiro cut back due to elevated creatinine. Denies SOB/PND/Orthopnea. Drinks alcohol 1 pint a day the few weeks of the month. Weight at hoome 148-150 pounds.   HSH: Lives with two daughters on disability.     ROS: All systems negative except as listed in HPI, PMH and Problem List.  Past Medical History  Diagnosis Date  . Hypertension   . Throat cancer   . Cirrhosis of liver   . Shortness of breath   . Arthritis     Current Outpatient Prescriptions  Medication Sig Dispense Refill  . acetaminophen (TYLENOL) 500 MG tablet Take 1 tablet (500 mg total) by mouth every 8 (eight) hours as needed for pain.  90 tablet  0  . amLODipine (NORVASC) 5 MG tablet Take 1 tablet (5 mg total) by mouth daily.  180 tablet  3  . bisoprolol (ZEBETA) 10 MG tablet Take 1 tablet (10 mg total) by mouth daily.  30 tablet  6  .  diclofenac sodium (VOLTAREN) 1 % GEL Apply 2 g topically 4 (four) times daily as needed.  100 g  3  . furosemide (LASIX) 20 MG tablet Take 20 mg by mouth as needed. As needed for weight over 140 lbs      . hydrALAZINE (APRESOLINE) 25 MG tablet Take 2 tablets (50 mg total) by mouth 2 (two) times daily.  60 tablet  6  . isosorbide mononitrate (IMDUR) 30 MG 24 hr tablet Take 1 tablet (30 mg total) by mouth daily.  30 tablet  3  . lisinopril (PRINIVIL,ZESTRIL) 20 MG tablet Take 1 tablet (20 mg total) by mouth daily.  30 tablet  6  . megestrol (MEGACE) 400 MG/10ML suspension Take 10 mLs (400 mg total) by mouth daily. Dispense enough for one month  240 mL  5  . spironolactone (ALDACTONE) 25 MG tablet Take 0.5 tablets (12.5 mg total) by mouth daily.  30 tablet  6  . traMADol (ULTRAM) 50 MG tablet Take 1 tablet (50 mg total) by mouth every 8 (eight) hours as needed for pain.  90 tablet  0  . atorvastatin (LIPITOR) 20 MG tablet Take 1  tablet (20 mg total) by mouth daily.  30 tablet  11  . levothyroxine (SYNTHROID) 200 MCG tablet Take 1 tablet (200 mcg total) by mouth daily.  30 tablet  2   No current facility-administered medications for this encounter.    Filed Vitals:   12/06/12 1034  BP: 122/70  Pulse: 88  Weight: 146 lb 12.8 oz (66.588 kg)  SpO2: 98%    PHYSICAL EXAM: General:  Cachetic . Chronically ill appearing. No resp difficulty; daughters present HEENT: tracheostomy with pink peristoma Neck: supple. JVP flat . Carotids 2+ bilaterally; no bruits. No lymphadenopathy or thryomegaly appreciated. Cor: PMI normal. Regular rate & rhythm. No rubs, gallops or murmurs. Lungs: clear Abdomen: obese, soft, nontender, nondistended. No hepatosplenomegaly. No bruits or masses. Good bowel sounds. Extremities: no cyanosis, clubbing, rash, edema Neuro: alert & orientedx3, cranial nerves grossly intact. Moves all 4 extremities w/o difficulty. Affect pleasant.   ASSESSMENT & PLAN:  1) Chronic systolic  HF: NICM, likely related to ETOH. EF improved to 55% (05/2012).  NYHA I symptoms. Volume status good. Continue lasix as needed. Continue spironolactone 12.5 mg daily. Labs 12/04/12 Creatinine 1.1 K 4.1 - Continue bisoprolol 10 mg daily.   -Continue hydralazine 50 mg twice a day. Not tid due to compliance. Continue Imdur 30 mg daily - Reinforced the need and importance of daily weights, a low sodium diet, and fluid restriction (less than 2 L a day). Instructed to call the HF clinic if weight increases more than 3 lbs overnight or 5 lbs in a week.  2) HTN: Much better controlled.  - Continue  amlodipine 5 mg daily and bisoprolol to 10 mg daily  3) Current alcohol - Drinks 1 pint a day at least 14 days a week Reinforced alcohol cessation however he declines.    Follow up in 2-3 months CLEGG,AMY NP-C 10:45 AM

## 2012-12-06 NOTE — Patient Instructions (Addendum)
Follow 2-3 months   Take bisoprolol 10 mg daily   Do the following things EVERYDAY: 1) Weigh yourself in the morning before breakfast. Write it down and keep it in a log. 2) Take your medicines as prescribed 3) Eat low salt foods-Limit salt (sodium) to 2000 mg per day.  4) Stay as active as you can everyday 5) Limit all fluids for the day to less than 2 liters

## 2012-12-06 NOTE — Telephone Encounter (Signed)
Please call [name] and make him aware I have called in two scripts for him. I have received the results of his lab work and his thyroid is still a little low, so I have increased his synthroid dose. I will need him to make an appointment in 5 weeks to have this rechecked. I also would like him to start a statin medication for his cholesterol, if he can tolerate it. I will send all his lab values to him by mail. Thanks.

## 2013-02-07 ENCOUNTER — Ambulatory Visit (INDEPENDENT_AMBULATORY_CARE_PROVIDER_SITE_OTHER): Payer: PRIVATE HEALTH INSURANCE | Admitting: Emergency Medicine

## 2013-02-07 ENCOUNTER — Encounter: Payer: Self-pay | Admitting: Emergency Medicine

## 2013-02-07 VITALS — BP 123/86 | HR 81 | Temp 98.2°F | Wt 134.0 lb

## 2013-02-07 DIAGNOSIS — J029 Acute pharyngitis, unspecified: Secondary | ICD-10-CM | POA: Insufficient documentation

## 2013-02-07 MED ORDER — BENZOCAINE 20 % MT SOLN: 1.0000 "application " | Freq: Three times a day (TID) | OROMUCOSAL | Status: DC | PRN

## 2013-02-07 MED ORDER — GUAIFENESIN ER 600 MG PO TB12: 600.0000 mg | ORAL_TABLET | Freq: Two times a day (BID) | ORAL | Status: DC

## 2013-02-07 NOTE — Assessment & Plan Note (Addendum)
Pain with swallowing. Concern for recurrent laryngeal cancer vs late side effect of radiation. Not consistent with infectious process. Weight down 12lbs in 2 months Will check plain films of neck and refer to ENT for laryngoscopy. Mucinex to help with secretions. Hurricaine solution to try and help symptomatically. Follow up with PCP in 4 weeks or sooner if worsening.

## 2013-02-07 NOTE — Patient Instructions (Signed)
It was nice to meet you!  I am concerned about a recurrence of your cancer or a late side effect from the radiation.  Use hurricaine solution to numb your throat before meals to see if this will help you eat better.  Take mucinex 1 pill twice a day to help with thick secretions.  You should get a call from our office in the next week to schedule an appointment with an ENT doctor who can look at your throat with a camera.  Follow up with your regular doctor in 4 weeks.

## 2013-02-07 NOTE — Progress Notes (Signed)
   Subjective:    Patient ID: Theodoro Kalata, male    DOB: 1950-04-06, 62 y.o.   MRN: [id number]  HPI PEDRO WHITERS is here for a SDA for pain in his throat.  History is obtained from the patient and his daughter/daughter-in-law over the phone.  He has had pain in his throat with food for the last week.  Pain is described as a burning that occurs with eating only. Pain is located under the jaw bones bilaterally.  Also states neck feels "tight."  He is able to drink water and coffee well, but has pain with sodas.  He has had decreased appetite and weight loss associated with this.  He denies increased cough (has chronic cough), but does report very thick sputum that will stop up his tracheostomy.  Denies any nasal symptoms or fevers.  No sick contacts.    He has a history of laryngeal cancers treated with surgery and radiation several years ago.    I have reviewed and updated the following as appropriate: allergies and current medications SHx: former smoker   Review of Systems See HPI    Objective:   Physical Exam BP 123/86  Pulse 81  Temp(Src) 98.2 F (36.8 C) (Oral)  Wt 134 lb (60.782 kg)  SpO2 100% Gen: alert, cooperative, NAD HEENT: AT/, sclera white, MMM, no pharyngeal erythema or edema, no erythema or swelling of gums Neck: supple, no LAD, tracheostomy clean and dry CV: RRR, no murmurs Pulm: CTAB, no wheezes or rales     Assessment & Plan:

## 2013-02-17 ENCOUNTER — Ambulatory Visit (HOSPITAL_COMMUNITY)
Admission: RE | Admit: 2013-02-17 | Discharge: 2013-02-17 | Disposition: A | Discharge status: Home / Self Care | Payer: PRIVATE HEALTH INSURANCE | Source: Ambulatory Visit | Attending: Family Medicine | Admitting: Family Medicine

## 2013-02-17 DIAGNOSIS — R070 Pain in throat: Secondary | ICD-10-CM | POA: Insufficient documentation

## 2013-02-17 DIAGNOSIS — Z8521 Personal history of malignant neoplasm of larynx: Secondary | ICD-10-CM | POA: Insufficient documentation

## 2013-02-17 DIAGNOSIS — J029 Acute pharyngitis, unspecified: Secondary | ICD-10-CM

## 2013-03-12 ENCOUNTER — Encounter (HOSPITAL_COMMUNITY): Payer: Self-pay | Admitting: Emergency Medicine

## 2013-03-12 ENCOUNTER — Other Ambulatory Visit: Payer: Self-pay

## 2013-03-12 ENCOUNTER — Encounter: Payer: Self-pay | Admitting: Family Medicine

## 2013-03-12 ENCOUNTER — Emergency Department (HOSPITAL_COMMUNITY): Payer: PRIVATE HEALTH INSURANCE

## 2013-03-12 ENCOUNTER — Ambulatory Visit (INDEPENDENT_AMBULATORY_CARE_PROVIDER_SITE_OTHER): Payer: PRIVATE HEALTH INSURANCE | Admitting: Family Medicine

## 2013-03-12 ENCOUNTER — Emergency Department (HOSPITAL_COMMUNITY)
Admission: EM | Admit: 2013-03-12 | Discharge: 2013-03-12 | Disposition: A | Discharge status: Home / Self Care | Payer: PRIVATE HEALTH INSURANCE | Attending: Emergency Medicine | Admitting: Emergency Medicine

## 2013-03-12 ENCOUNTER — Ambulatory Visit (HOSPITAL_COMMUNITY)
Admission: RE | Admit: 2013-03-12 | Discharge: 2013-03-12 | Disposition: A | Discharge status: Home / Self Care | Payer: PRIVATE HEALTH INSURANCE | Source: Ambulatory Visit | Attending: Family Medicine | Admitting: Family Medicine

## 2013-03-12 VITALS — BP 171/120 | HR 122 | Wt 127.0 lb

## 2013-03-12 DIAGNOSIS — R0789 Other chest pain: Secondary | ICD-10-CM | POA: Insufficient documentation

## 2013-03-12 DIAGNOSIS — Z79899 Other long term (current) drug therapy: Secondary | ICD-10-CM | POA: Insufficient documentation

## 2013-03-12 DIAGNOSIS — R062 Wheezing: Secondary | ICD-10-CM

## 2013-03-12 DIAGNOSIS — E039 Hypothyroidism, unspecified: Secondary | ICD-10-CM | POA: Insufficient documentation

## 2013-03-12 DIAGNOSIS — Z87891 Personal history of nicotine dependence: Secondary | ICD-10-CM | POA: Insufficient documentation

## 2013-03-12 DIAGNOSIS — R079 Chest pain, unspecified: Secondary | ICD-10-CM

## 2013-03-12 DIAGNOSIS — J441 Chronic obstructive pulmonary disease with (acute) exacerbation: Secondary | ICD-10-CM | POA: Insufficient documentation

## 2013-03-12 DIAGNOSIS — I498 Other specified cardiac arrhythmias: Secondary | ICD-10-CM | POA: Insufficient documentation

## 2013-03-12 DIAGNOSIS — Z8521 Personal history of malignant neoplasm of larynx: Secondary | ICD-10-CM | POA: Insufficient documentation

## 2013-03-12 DIAGNOSIS — I5022 Chronic systolic (congestive) heart failure: Secondary | ICD-10-CM | POA: Insufficient documentation

## 2013-03-12 DIAGNOSIS — R05 Cough: Secondary | ICD-10-CM

## 2013-03-12 DIAGNOSIS — R634 Abnormal weight loss: Secondary | ICD-10-CM | POA: Insufficient documentation

## 2013-03-12 DIAGNOSIS — Z8719 Personal history of other diseases of the digestive system: Secondary | ICD-10-CM | POA: Insufficient documentation

## 2013-03-12 DIAGNOSIS — R059 Cough, unspecified: Secondary | ICD-10-CM

## 2013-03-12 DIAGNOSIS — I10 Essential (primary) hypertension: Secondary | ICD-10-CM | POA: Insufficient documentation

## 2013-03-12 DIAGNOSIS — M129 Arthropathy, unspecified: Secondary | ICD-10-CM | POA: Insufficient documentation

## 2013-03-12 DIAGNOSIS — R9431 Abnormal electrocardiogram [ECG] [EKG]: Secondary | ICD-10-CM | POA: Insufficient documentation

## 2013-03-12 LAB — CBC WITH DIFFERENTIAL/PLATELET
Basophils Absolute: 0 10*3/uL (ref 0.0–0.1)
Basophils Relative: 0 % (ref 0–1)
EOS ABS: 0.1 10*3/uL (ref 0.0–0.7)
EOS PCT: 2 % (ref 0–5)
HCT: 38.2 % — ABNORMAL LOW (ref 39.0–52.0)
Hemoglobin: 12.9 g/dL — ABNORMAL LOW (ref 13.0–17.0)
LYMPHS ABS: 1.6 10*3/uL (ref 0.7–4.0)
LYMPHS PCT: 33 % (ref 12–46)
MCH: 29.3 pg (ref 26.0–34.0)
MCHC: 33.8 g/dL (ref 30.0–36.0)
MCV: 86.6 fL (ref 78.0–100.0)
Monocytes Absolute: 0.6 10*3/uL (ref 0.1–1.0)
Monocytes Relative: 12 % (ref 3–12)
Neutro Abs: 2.5 10*3/uL (ref 1.7–7.7)
Neutrophils Relative %: 52 % (ref 43–77)
Platelets: 256 10*3/uL (ref 150–400)
RBC: 4.41 MIL/uL (ref 4.22–5.81)
RDW: 17.5 % — AB (ref 11.5–15.5)
WBC: 4.7 10*3/uL (ref 4.0–10.5)

## 2013-03-12 LAB — PRO B NATRIURETIC PEPTIDE: Pro B Natriuretic peptide (BNP): 87.6 pg/mL (ref 0–125)

## 2013-03-12 LAB — CREATININE, SERUM
CREATININE: 1.17 mg/dL (ref 0.50–1.35)
GFR calc Af Amer: 77 mL/min — ABNORMAL LOW (ref >90)
GFR calc non Af Amer: 66 mL/min — ABNORMAL LOW (ref >90)

## 2013-03-12 LAB — POCT I-STAT TROPONIN I
TROPONIN I, POC: 0 ng/mL (ref 0.00–0.08)
Troponin i, poc: 0 ng/mL (ref 0.00–0.08)

## 2013-03-12 LAB — D-DIMER, QUANTITATIVE: D-Dimer, Quant: 3.84 ug/mL-FEU — ABNORMAL HIGH (ref 0.00–0.48)

## 2013-03-12 MED ORDER — ASPIRIN 81 MG PO CHEW
324.0000 mg | CHEWABLE_TABLET | Freq: Once | ORAL | Status: AC
  Administered 2013-03-12: 324 mg via ORAL
  Filled 2013-03-12: qty 4

## 2013-03-12 MED ORDER — NITROGLYCERIN 0.4 MG SL SUBL
0.4000 mg | SUBLINGUAL_TABLET | SUBLINGUAL | Status: AC | PRN
  Administered 2013-03-12 (×3): 0.4 mg via SUBLINGUAL
  Filled 2013-03-12: qty 25

## 2013-03-12 MED ORDER — ONDANSETRON HCL 4 MG/2ML IJ SOLN
4.0000 mg | Freq: Once | INTRAMUSCULAR | Status: AC
  Administered 2013-03-12: 4 mg via INTRAVENOUS
  Filled 2013-03-12: qty 2

## 2013-03-12 MED ORDER — PREDNISONE 20 MG PO TABS: 60.0000 mg | ORAL_TABLET | Freq: Once | ORAL | Status: DC

## 2013-03-12 MED ORDER — PREDNISONE 20 MG PO TABS: 60.0000 mg | ORAL_TABLET | Freq: Every day | ORAL | Status: AC

## 2013-03-12 MED ORDER — IPRATROPIUM-ALBUTEROL 0.5-2.5 (3) MG/3ML IN SOLN
3.0000 mL | Freq: Once | RESPIRATORY_TRACT | Status: AC
  Administered 2013-03-12: 3 mL via RESPIRATORY_TRACT
  Filled 2013-03-12: qty 3

## 2013-03-12 MED ORDER — IOHEXOL 350 MG/ML SOLN
100.0000 mL | Freq: Once | INTRAVENOUS | Status: AC | PRN
  Administered 2013-03-12: 80 mL via INTRAVENOUS

## 2013-03-12 MED ORDER — MORPHINE SULFATE 2 MG/ML IJ SOLN
2.0000 mg | Freq: Once | INTRAMUSCULAR | Status: AC
  Administered 2013-03-12: 2 mg via INTRAVENOUS
  Filled 2013-03-12: qty 1

## 2013-03-12 NOTE — ED Provider Notes (Signed)
CSN: [id number]     Arrival date & time 03/12/13  1519 History   First MD Initiated Contact with Patient 03/12/13 1520     Chief Complaint  Patient presents with  . Chest Pain   HPI  63 y/o male with history of COPD, chronic systolic heart failure (EF 30%), laryngeal cancer S/P laryngectomy (2009), HTN, hypothyroidism, cirrohsis who presents with cc of chest pain. States the pain started last night. Has been intermittent. It is currently a 3/10. It was a 5/10 at his PCP's office but improved with a SL nitro given by EMS. It is described as "sharp" and "burning". Radiates to right chest. Denies any modifying factors. Went to pcp today for a checkup. Informed them of the pain and he was sent here for further evaluation. He states that for the last 5 days he has felt weak and fatigued. He has had a mild cough productive of white sputum. He denies any fevers, chills, vomiting, or diarrhea.  Past Medical History  Diagnosis Date  . Hypertension   . Throat cancer   . Cirrhosis of liver   . Shortness of breath   . Arthritis    Past Surgical History  Procedure Laterality Date  . Tracheal surgery    . Tracheostomy     No family history on file. History  Substance Use Topics  . Smoking status: Former Smoker -- 0.20 packs/day for 50 years    Types: Cigarettes  . Smokeless tobacco: Never Used  . Alcohol Use: Yes     Comment: "rarely"    Review of Systems  Constitutional: Negative for fever and chills.  Respiratory: Positive for cough and shortness of breath.   Cardiovascular: Positive for chest pain. Negative for palpitations and leg swelling.  Gastrointestinal: Negative for nausea, vomiting, abdominal pain, diarrhea and constipation.  Genitourinary: Negative for dysuria and frequency.  All other systems reviewed and are negative.   Allergies  Vicodin  Home Medications   Current Outpatient Rx  Name  Route  Sig  Dispense  Refill  . acetaminophen (TYLENOL) 500 MG tablet   Oral    Take 1 tablet (500 mg total) by mouth every 8 (eight) hours as needed for pain.   90 tablet   0   . amLODipine (NORVASC) 5 MG tablet   Oral   Take 1 tablet (5 mg total) by mouth daily.   180 tablet   3   . atorvastatin (LIPITOR) 20 MG tablet   Oral   Take 1 tablet (20 mg total) by mouth daily.   30 tablet   11   . benzocaine (HURRICAINE) 20 % SOLN   Mouth/Throat   Use as directed 1 application in the mouth or throat 3 (three) times daily as needed for mouth pain.   30 mL   3   . bisoprolol (ZEBETA) 10 MG tablet   Oral   Take 1 tablet (10 mg total) by mouth daily.   30 tablet   6   . diclofenac sodium (VOLTAREN) 1 % GEL   Topical   Apply 2 g topically 4 (four) times daily as needed. For pain         . furosemide (LASIX) 20 MG tablet   Oral   Take 20 mg by mouth daily as needed for fluid. As needed for weight over 140 lbs         . guaiFENesin (MUCINEX) 600 MG 12 hr tablet   Oral   Take 1 tablet (600 mg  total) by mouth 2 (two) times daily.   60 tablet   1   . hydrALAZINE (APRESOLINE) 25 MG tablet   Oral   Take 2 tablets (50 mg total) by mouth 2 (two) times daily.   60 tablet   6   . isosorbide mononitrate (IMDUR) 30 MG 24 hr tablet   Oral   Take 1 tablet (30 mg total) by mouth daily.   30 tablet   3   . levothyroxine (SYNTHROID) 200 MCG tablet   Oral   Take 1 tablet (200 mcg total) by mouth daily.   30 tablet   2   . lisinopril (PRINIVIL,ZESTRIL) 20 MG tablet   Oral   Take 1 tablet (20 mg total) by mouth daily.   30 tablet   6   . megestrol (MEGACE) 400 MG/10ML suspension   Oral   Take 10 mLs (400 mg total) by mouth daily. Dispense enough for one month   240 mL   5   . spironolactone (ALDACTONE) 25 MG tablet   Oral   Take 0.5 tablets (12.5 mg total) by mouth daily.   30 tablet   6   . traMADol (ULTRAM) 50 MG tablet   Oral   Take 1 tablet (50 mg total) by mouth every 8 (eight) hours as needed for pain.   90 tablet   0   . predniSONE  (DELTASONE) 20 MG tablet   Oral   Take 3 tablets (60 mg total) by mouth daily.   12 tablet   0    BP 150/104  Pulse 99  Temp(Src) 98.7 F (37.1 C) (Oral)  Resp 23  SpO2 99% Physical Exam  Constitutional: He is oriented to person, place, and time. He appears well-developed and well-nourished. No distress.  HENT:  Head: Normocephalic and atraumatic.  Mouth/Throat: No oropharyngeal exudate.  Eyes: Conjunctivae are normal. Pupils are equal, round, and reactive to light.  Neck: Normal range of motion. Neck supple.  Tracheal stoma  Cardiovascular: Normal rate and normal heart sounds.  Exam reveals no gallop and no friction rub.   No murmur heard. Pulmonary/Chest: Tachypnea (mild) noted. He has no wheezes.  diminished  Abdominal: Soft. He exhibits no distension. There is no tenderness.  Musculoskeletal: Normal range of motion. He exhibits no edema and no tenderness.  Neurological: He is alert and oriented to person, place, and time. He has normal strength and normal reflexes. No cranial nerve deficit or sensory deficit. Coordination normal. GCS eye subscore is 4. GCS verbal subscore is 5. GCS motor subscore is 6.  Skin: Skin is warm and dry.   ED Course  Procedures (including critical care time) Labs Review Labs Reviewed  CBC WITH DIFFERENTIAL - Abnormal; Notable for the following:    Hemoglobin 12.9 (*)    HCT 38.2 (*)    RDW 17.5 (*)    All other components within normal limits  D-DIMER, QUANTITATIVE - Abnormal; Notable for the following:    D-Dimer, Quant 3.84 (*)    All other components within normal limits  CREATININE, SERUM - Abnormal; Notable for the following:    GFR calc non Af Amer 66 (*)    GFR calc Af Amer 77 (*)    All other components within normal limits  PRO B NATRIURETIC PEPTIDE  POCT I-STAT TROPONIN I  POCT I-STAT TROPONIN I   Imaging Review Dg Chest 2 View  03/12/2013   CLINICAL DATA:  Chest pain, history of hypertension and previous tobacco use  EXAM:  CHEST  2 VIEW  COMPARISON:  Portable chest x-ray of December 26, 2011.  FINDINGS: The lungs are well-expanded. There is no focal infiltrate. There is no pleural effusion or pneumothorax. The cardiac silhouette is normal in size. The pulmonary vascularity is not engorged. The mediastinum is normal in width. There is a stable surgical Magic vascular clips projecting over the medial aspect of the right 2nd rib. The thoracic vertebral bodies are preserved in height with visualized.  IMPRESSION: There is no evidence of pneumonia nor CHF or other acute cardiopulmonary abnormality. Mild hyperinflation may reflect reactive airway disease or COPD. There is no significant hemidiaphragm flattening.   Electronically Signed   By: David  Martinique   On: 03/12/2013 16:36   Ct Angio Chest Pe W/cm &/or Wo Cm  03/12/2013   CLINICAL DATA:  Unintentional weight loss, laryngeal cancer post resection, chest pain, decreased appetite, history cirrhosis  EXAM: CT ANGIOGRAPHY CHEST WITH CONTRAST  TECHNIQUE: Multidetector CT imaging of the chest was performed using the standard protocol during bolus administration of intravenous contrast. Multiplanar CT image reconstructions including MIPs were obtained to evaluate the vascular anatomy.  CONTRAST:  2mL OMNIPAQUE IOHEXOL 350 MG/ML SOLN  COMPARISON:  CT ANGIO CHEST W/CM &/OR WO/CM dated 11/14/2010  FINDINGS: Post tracheostomy.  Atherosclerotic calcifications aorta and coronary arteries.  No aortic aneurysm or dissection.  Fatty infiltration of liver and significant ascites seen on previous exam no longer identified.  Bowel interposition between liver and diaphragm.  Visualized portion of upper abdomen grossly unremarkable.  No thoracic adenopathy.  Pulmonary arteries patent.  No evidence of pulmonary embolism.  3 mm right upper lobe nodule image 38 unchanged.  Calcified granuloma left lower lobe image 68 unchanged.  No pleural effusion or pneumothorax.  Minimal anterior height loss of a mid  thoracic vertebra secondary to an inferior endplate compression deformity, approximately T6, appears old but new since 11/14/2010.  Review of the MIP images confirms the above findings.  IMPRESSION: No evidence of pulmonary embolism.  Dependent atelectasis in bilateral lower lobes.  Stable pulmonary nodules.  Resolution of ascites and fatty infiltration of liver seen on previous exam.   Electronically Signed   By: Lavonia Dana M.D.   On: 03/12/2013 20:26    EKG Interpretation   None      MDM   1. Chest pain   2. Cough   3. Wheezing     Here with chest pain. AtypicalHervey Ard. With slight pleuritic component. + D-dimer. CT without evidence of pneumonia, dissection or pulmonary embolism. EKG without acute ischemic change. Delta troponin negative. Recent negative cath (13'). Doubt ACS. Likely MSK from bronchitis. The patient was instructed to continue using his home nebulizer treatments. Given mild wheezing on initial presentation will prescribe a 5 day course of predniosone. Follow up with pcp in 1 day for recheck.     Donita Brooks, MD 03/12/13 [phone]

## 2013-03-12 NOTE — ED Notes (Signed)
Pt back from CT

## 2013-03-12 NOTE — ED Notes (Signed)
EDP aware PT needs doppler IV start.

## 2013-03-12 NOTE — Assessment & Plan Note (Addendum)
Patient was seen for chest pain today in clinic. He has a history of cardiac arrest status post surgery 3 years ago. EKG obtained in office did not show signs of NSTEMI. NSR.  It is available in the chart and given to EMS.  Considering past medical history, unstable blood pressure and tachycardia patient was transported to ED via EMS. Patient was given 325 ASA and supplemental oxygen while in office awaiting EMS any event he was having an MI. Of note patient has had a 12 pound weight loss with decreased appetite since his visit one month ago. Concern for other possible cancer etiologies. Differential diagnosis: MI, PE or pneumonia.

## 2013-03-12 NOTE — ED Notes (Signed)
Pt transported to CT ?

## 2013-03-12 NOTE — Progress Notes (Signed)
   Subjective:    [name], male    DOB: 1951-03-03, 63 y.o.   MRN: [id number]  HPI  Chest Pain with dyspnea: Patient is seen today for chest pain that he reports is 10 out of 10 crushing pain mid sternum. Patient has history of cardiac arrest after surgery 3 years ago. He also has history of  laryngeal cancer, that has left him with a tracheostomy. Patient reports shortness of breath that started 4 days ago. He states he wasn't feeling well last week and did not take his blood pressure meds on Friday or Saturday because he was having low blood pressures in the 82-505 systolic. His blood pressures normally run within range during office on medications. He has complained of headaches over the weekend. Patient states his chest pain started late last night/early morning. It is sharp and crushing, radiates to his shoulder. He has nausea and vomit since reaching the office today. He is abruptly complained during EKG of sharp right lower quadrant pain.   Weight loss: Patient with approximate 12 pound unintentional weight loss in one month. Daughter explains that she is unable to get him to eat anything. Again, patient has a history of malignant neoplasm of the larynx and a possible pulmonary nodule. A full workup will eventually need to be completed. Unable to do so today with acute chest pain and transport to ED.  Patient Active Problem List   Diagnosis Date Noted  . Chest pain 03/12/2013  . Sore throat 02/07/2013  . Pain in joint, upper arm 11/28/2012  . Elevated serum creatinine 11/28/2012  . Chronic systolic heart failure 39/76/7341  . Alcohol dependence 01/04/2012  . Decreased appetite 01/03/2012  . Acute and chronic respiratory failure 12/26/2011  . Acute systolic HF (heart failure) 12/26/2011  . Dyspnea 11/10/2011  . Allergic rhinitis 06/22/2011  . Hypothyroidism 10/16/2010  . NEOPLASM, MALIGNANT, LARYNX 01/10/2010  . PULMONARY NODULE 12/31/2007  . ESSENTIAL  HYPERTENSION, BENIGN 11/08/2007     Review of Systems Negative, with the exception of above mentioned in HPI  Objective:   Physical Exam BP 171/120  Pulse 122  Wt 127 lb (57.607 kg)  SpO2 95% Gen: Pt distressed. Active CP. SOB.  CV: Tachycardic. No murmurs noted.  Chest: diminished breath sounds. No crackles appreciated. Abd: Soft. Tender RLQ. Vomiting. ND. No masses palpated.  Ext: RLQ abdominal pain.

## 2013-03-12 NOTE — Assessment & Plan Note (Signed)
Patient has had approximately 12 pound unintentional weight loss in one month. Has been a cancer patient with resection (laryngeal). Daughter reports decreased appetite unable to get him to eat anything. Was unable to address any possible issues of his weight loss or workup considering acute chest pain. Patient sent directly to ED via EMS for chest pain.

## 2013-03-12 NOTE — Progress Notes (Signed)
Pt has a stoma that is clean and WNL, Pt is tolerating well on RA with no complications noted. RT will monitor.

## 2013-03-12 NOTE — ED Notes (Signed)
Pt transported from His PCP office Via EMS. Pt reported CP and SHOB . Pt does have a HX of a stoma / Trach.EMS gave one SL  NGT . Pt CP was 5/10 and CP decreased to 3/10 following NGT.

## 2013-03-12 NOTE — ED Notes (Signed)
Pt reports feeling better following RESP. Treatment.

## 2013-03-12 NOTE — Patient Instructions (Signed)
Pt transported via EMS to ED for CP

## 2013-03-13 NOTE — ED Provider Notes (Signed)
I saw and evaluated the patient, reviewed the resident's note and I agree with the findings and plan.  EKG Interpretation   None       Date: 03/13/2013  Rate: 121  Rhythm: sinus tachycardia  QRS Axis: left  Intervals: normal  ST/T Wave abnormalities: nonspecific ST/T changes  Conduction Disutrbances:none  Narrative Interpretation:   Old EKG Reviewed: unchanged    Patient with atypical CP in setting of viral illness with cough. Doubt ACS, and CT shows no PE or dissection. Improved with breathing treatments and pain control. HR improved. Will treat as COPD exacerbation and encourage outpatient f/u.  Ephraim Hamburger, MD 03/13/13 1736

## 2013-05-08 ENCOUNTER — Encounter (HOSPITAL_COMMUNITY): Payer: Self-pay | Admitting: Emergency Medicine

## 2013-05-08 ENCOUNTER — Inpatient Hospital Stay (HOSPITAL_COMMUNITY)
Admission: EM | Admit: 2013-05-08 | Discharge: 2013-05-14 | DRG: 641 | Disposition: A | Discharge status: Skilled Nursing Facility | Payer: Medicare Other | Attending: Family Medicine | Admitting: Family Medicine

## 2013-05-08 ENCOUNTER — Emergency Department (HOSPITAL_COMMUNITY): Payer: Medicare Other

## 2013-05-08 DIAGNOSIS — E876 Hypokalemia: Secondary | ICD-10-CM | POA: Diagnosis not present

## 2013-05-08 DIAGNOSIS — J449 Chronic obstructive pulmonary disease, unspecified: Secondary | ICD-10-CM | POA: Diagnosis not present

## 2013-05-08 DIAGNOSIS — R680 Hypothermia, not associated with low environmental temperature: Secondary | ICD-10-CM | POA: Diagnosis not present

## 2013-05-08 DIAGNOSIS — R634 Abnormal weight loss: Secondary | ICD-10-CM | POA: Diagnosis present

## 2013-05-08 DIAGNOSIS — M1990 Unspecified osteoarthritis, unspecified site: Secondary | ICD-10-CM | POA: Diagnosis present

## 2013-05-08 DIAGNOSIS — I509 Heart failure, unspecified: Secondary | ICD-10-CM | POA: Diagnosis present

## 2013-05-08 DIAGNOSIS — D649 Anemia, unspecified: Secondary | ICD-10-CM | POA: Diagnosis not present

## 2013-05-08 DIAGNOSIS — I10 Essential (primary) hypertension: Secondary | ICD-10-CM | POA: Diagnosis not present

## 2013-05-08 DIAGNOSIS — Z87891 Personal history of nicotine dependence: Secondary | ICD-10-CM

## 2013-05-08 DIAGNOSIS — K861 Other chronic pancreatitis: Secondary | ICD-10-CM | POA: Diagnosis present

## 2013-05-08 DIAGNOSIS — R64 Cachexia: Secondary | ICD-10-CM | POA: Diagnosis present

## 2013-05-08 DIAGNOSIS — M25529 Pain in unspecified elbow: Secondary | ICD-10-CM

## 2013-05-08 DIAGNOSIS — I5042 Chronic combined systolic (congestive) and diastolic (congestive) heart failure: Secondary | ICD-10-CM | POA: Diagnosis present

## 2013-05-08 DIAGNOSIS — N179 Acute kidney failure, unspecified: Secondary | ICD-10-CM | POA: Diagnosis present

## 2013-05-08 DIAGNOSIS — Z79899 Other long term (current) drug therapy: Secondary | ICD-10-CM

## 2013-05-08 DIAGNOSIS — J9620 Acute and chronic respiratory failure, unspecified whether with hypoxia or hypercapnia: Secondary | ICD-10-CM

## 2013-05-08 DIAGNOSIS — R630 Anorexia: Secondary | ICD-10-CM

## 2013-05-08 DIAGNOSIS — R7989 Other specified abnormal findings of blood chemistry: Secondary | ICD-10-CM

## 2013-05-08 DIAGNOSIS — E46 Unspecified protein-calorie malnutrition: Secondary | ICD-10-CM | POA: Diagnosis present

## 2013-05-08 DIAGNOSIS — K7030 Alcoholic cirrhosis of liver without ascites: Secondary | ICD-10-CM | POA: Diagnosis present

## 2013-05-08 DIAGNOSIS — S0990XA Unspecified injury of head, initial encounter: Secondary | ICD-10-CM | POA: Diagnosis not present

## 2013-05-08 DIAGNOSIS — I959 Hypotension, unspecified: Secondary | ICD-10-CM | POA: Diagnosis not present

## 2013-05-08 DIAGNOSIS — J029 Acute pharyngitis, unspecified: Secondary | ICD-10-CM

## 2013-05-08 DIAGNOSIS — F1020 Alcohol dependence, uncomplicated: Secondary | ICD-10-CM | POA: Diagnosis present

## 2013-05-08 DIAGNOSIS — C329 Malignant neoplasm of larynx, unspecified: Secondary | ICD-10-CM

## 2013-05-08 DIAGNOSIS — E785 Hyperlipidemia, unspecified: Secondary | ICD-10-CM | POA: Diagnosis present

## 2013-05-08 DIAGNOSIS — Z9181 History of falling: Secondary | ICD-10-CM

## 2013-05-08 DIAGNOSIS — I951 Orthostatic hypotension: Secondary | ICD-10-CM

## 2013-05-08 DIAGNOSIS — Z923 Personal history of irradiation: Secondary | ICD-10-CM

## 2013-05-08 DIAGNOSIS — R079 Chest pain, unspecified: Secondary | ICD-10-CM

## 2013-05-08 DIAGNOSIS — Z681 Body mass index (BMI) 19 or less, adult: Secondary | ICD-10-CM

## 2013-05-08 DIAGNOSIS — T68XXXA Hypothermia, initial encounter: Secondary | ICD-10-CM

## 2013-05-08 DIAGNOSIS — J984 Other disorders of lung: Secondary | ICD-10-CM

## 2013-05-08 DIAGNOSIS — Z930 Tracheostomy status: Secondary | ICD-10-CM

## 2013-05-08 DIAGNOSIS — E039 Hypothyroidism, unspecified: Secondary | ICD-10-CM | POA: Diagnosis present

## 2013-05-08 DIAGNOSIS — I5021 Acute systolic (congestive) heart failure: Secondary | ICD-10-CM

## 2013-05-08 DIAGNOSIS — Z8521 Personal history of malignant neoplasm of larynx: Secondary | ICD-10-CM

## 2013-05-08 DIAGNOSIS — I5022 Chronic systolic (congestive) heart failure: Secondary | ICD-10-CM

## 2013-05-08 DIAGNOSIS — E860 Dehydration: Principal | ICD-10-CM | POA: Diagnosis present

## 2013-05-08 DIAGNOSIS — R0600 Dyspnea, unspecified: Secondary | ICD-10-CM

## 2013-05-08 DIAGNOSIS — E2749 Other adrenocortical insufficiency: Secondary | ICD-10-CM | POA: Diagnosis present

## 2013-05-08 HISTORY — DX: Hyperlipidemia, unspecified: E78.5

## 2013-05-08 HISTORY — DX: Malignant neoplasm of larynx, unspecified: C32.9

## 2013-05-08 HISTORY — DX: Alcohol abuse, uncomplicated: F10.10

## 2013-05-08 HISTORY — DX: Heart failure, unspecified: I50.9

## 2013-05-08 HISTORY — DX: Hypothyroidism, unspecified: E03.9

## 2013-05-08 LAB — CBC WITH DIFFERENTIAL/PLATELET
Basophils Absolute: 0 10*3/uL (ref 0.0–0.1)
Basophils Relative: 1 % (ref 0–1)
EOS ABS: 0 10*3/uL (ref 0.0–0.7)
Eosinophils Relative: 1 % (ref 0–5)
HCT: 37.9 % — ABNORMAL LOW (ref 39.0–52.0)
HEMOGLOBIN: 13.7 g/dL (ref 13.0–17.0)
LYMPHS ABS: 0.9 10*3/uL (ref 0.7–4.0)
LYMPHS PCT: 15 % (ref 12–46)
MCH: 31 pg (ref 26.0–34.0)
MCHC: 36.1 g/dL — ABNORMAL HIGH (ref 30.0–36.0)
MCV: 85.7 fL (ref 78.0–100.0)
Monocytes Absolute: 0.9 10*3/uL (ref 0.1–1.0)
Monocytes Relative: 15 % — ABNORMAL HIGH (ref 3–12)
NEUTROS ABS: 4 10*3/uL (ref 1.7–7.7)
Neutrophils Relative %: 70 % (ref 43–77)
PLATELETS: 165 10*3/uL (ref 150–400)
RBC: 4.42 MIL/uL (ref 4.22–5.81)
RDW: 16 % — ABNORMAL HIGH (ref 11.5–15.5)
WBC: 5.8 10*3/uL (ref 4.0–10.5)

## 2013-05-08 LAB — COMPREHENSIVE METABOLIC PANEL
ALK PHOS: 94 U/L (ref 39–117)
ALT: 39 U/L (ref 0–53)
AST: 40 U/L — ABNORMAL HIGH (ref 0–37)
Albumin: 3.5 g/dL (ref 3.5–5.2)
BILIRUBIN TOTAL: 2.1 mg/dL — AB (ref 0.3–1.2)
BUN: 53 mg/dL — AB (ref 6–23)
CHLORIDE: 98 meq/L (ref 96–112)
CO2: 20 meq/L (ref 19–32)
Calcium: 9.7 mg/dL (ref 8.4–10.5)
Creatinine, Ser: 1.75 mg/dL — ABNORMAL HIGH (ref 0.50–1.35)
GFR calc non Af Amer: 41 mL/min — ABNORMAL LOW (ref >90)
GFR, EST AFRICAN AMERICAN: 47 mL/min — AB (ref >90)
Glucose, Bld: 92 mg/dL (ref 70–99)
POTASSIUM: 3.7 meq/L (ref 3.7–5.3)
SODIUM: 140 meq/L (ref 137–147)
TOTAL PROTEIN: 8.9 g/dL — AB (ref 6.0–8.3)

## 2013-05-08 LAB — RAPID URINE DRUG SCREEN, HOSP PERFORMED
AMPHETAMINES: NOT DETECTED
BARBITURATES: NOT DETECTED
BENZODIAZEPINES: NOT DETECTED
COCAINE: NOT DETECTED
Opiates: NOT DETECTED
Tetrahydrocannabinol: NOT DETECTED

## 2013-05-08 LAB — LIPASE, BLOOD: Lipase: 29 U/L (ref 11–59)

## 2013-05-08 LAB — URINALYSIS, ROUTINE W REFLEX MICROSCOPIC
GLUCOSE, UA: NEGATIVE mg/dL
KETONES UR: 15 mg/dL — AB
NITRITE: POSITIVE — AB
PH: 5 (ref 5.0–8.0)
Protein, ur: 30 mg/dL — AB
SPECIFIC GRAVITY, URINE: 1.023 (ref 1.005–1.030)
Urobilinogen, UA: 1 mg/dL (ref 0.0–1.0)

## 2013-05-08 LAB — TROPONIN I: Troponin I: <0.3 ng/mL (ref <0.30)

## 2013-05-08 LAB — PRO B NATRIURETIC PEPTIDE: PRO B NATRI PEPTIDE: 157.7 pg/mL — AB (ref 0–125)

## 2013-05-08 LAB — URINE MICROSCOPIC-ADD ON

## 2013-05-08 LAB — PROTIME-INR
INR: 1.16 (ref 0.00–1.49)
Prothrombin Time: 14.6 seconds (ref 11.6–15.2)

## 2013-05-08 LAB — LACTIC ACID, PLASMA: Lactic Acid, Venous: 2.1 mmol/L (ref 0.5–2.2)

## 2013-05-08 MED ORDER — LISINOPRIL 20 MG PO TABS
20.0000 mg | ORAL_TABLET | Freq: Every day | ORAL | Status: DC
  Administered 2013-05-09: 20 mg via ORAL
  Filled 2013-05-08 (×2): qty 1

## 2013-05-08 MED ORDER — ONDANSETRON HCL 4 MG PO TABS: 4.0000 mg | ORAL_TABLET | Freq: Four times a day (QID) | ORAL | Status: DC | PRN

## 2013-05-08 MED ORDER — HYDRALAZINE HCL 20 MG/ML IJ SOLN
10.0000 mg | INTRAMUSCULAR | Status: DC | PRN
  Administered 2013-05-08: 10 mg via INTRAVENOUS
  Filled 2013-05-08: qty 1

## 2013-05-08 MED ORDER — ONDANSETRON HCL 4 MG/2ML IJ SOLN: 4.0000 mg | Freq: Four times a day (QID) | INTRAMUSCULAR | Status: DC | PRN

## 2013-05-08 MED ORDER — ISOSORBIDE MONONITRATE ER 30 MG PO TB24
30.0000 mg | ORAL_TABLET | Freq: Every day | ORAL | Status: DC
  Administered 2013-05-09: 30 mg via ORAL
  Filled 2013-05-08 (×2): qty 1

## 2013-05-08 MED ORDER — SODIUM CHLORIDE 0.9 % IV SOLN
INTRAVENOUS | Status: AC
  Administered 2013-05-08: 18:00:00 via INTRAVENOUS

## 2013-05-08 MED ORDER — BISOPROLOL FUMARATE 10 MG PO TABS
10.0000 mg | ORAL_TABLET | Freq: Every day | ORAL | Status: DC
  Administered 2013-05-09: 10 mg via ORAL
  Filled 2013-05-08 (×3): qty 1

## 2013-05-08 MED ORDER — HEPARIN SODIUM (PORCINE) 5000 UNIT/ML IJ SOLN
5000.0000 [IU] | Freq: Three times a day (TID) | INTRAMUSCULAR | Status: DC
Start: 2013-05-08 — End: 2013-05-14
  Administered 2013-05-08 – 2013-05-14 (×18): 5000 [IU] via SUBCUTANEOUS
  Filled 2013-05-08 (×22): qty 1

## 2013-05-08 MED ORDER — ATORVASTATIN CALCIUM 20 MG PO TABS
20.0000 mg | ORAL_TABLET | Freq: Every day | ORAL | Status: DC
  Administered 2013-05-09 – 2013-05-14 (×6): 20 mg via ORAL
  Filled 2013-05-08 (×7): qty 1

## 2013-05-08 MED ORDER — ACETAMINOPHEN 650 MG RE SUPP: 650.0000 mg | Freq: Four times a day (QID) | RECTAL | Status: DC | PRN

## 2013-05-08 MED ORDER — ENSURE COMPLETE PO LIQD
237.0000 mL | Freq: Two times a day (BID) | ORAL | Status: DC
  Administered 2013-05-09 – 2013-05-14 (×11): 237 mL via ORAL

## 2013-05-08 MED ORDER — TRAMADOL HCL 50 MG PO TABS
50.0000 mg | ORAL_TABLET | Freq: Every day | ORAL | Status: DC | PRN
  Administered 2013-05-09: 50 mg via ORAL
  Filled 2013-05-08: qty 1

## 2013-05-08 MED ORDER — MEGESTROL ACETATE 400 MG/10ML PO SUSP
400.0000 mg | Freq: Every day | ORAL | Status: DC
  Administered 2013-05-09 – 2013-05-13 (×5): 400 mg via ORAL
  Filled 2013-05-08 (×6): qty 10

## 2013-05-08 MED ORDER — ACETAMINOPHEN 325 MG PO TABS: 650.0000 mg | ORAL_TABLET | Freq: Four times a day (QID) | ORAL | Status: DC | PRN

## 2013-05-08 MED ORDER — SODIUM CHLORIDE 0.45 % IV SOLN
INTRAVENOUS | Status: DC
  Administered 2013-05-08 – 2013-05-09 (×2): via INTRAVENOUS
  Administered 2013-05-09: 75 mL/h via INTRAVENOUS
  Administered 2013-05-11 (×2): via INTRAVENOUS

## 2013-05-08 MED ORDER — LEVOTHYROXINE SODIUM 200 MCG PO TABS
200.0000 ug | ORAL_TABLET | Freq: Every day | ORAL | Status: DC
  Filled 2013-05-08 (×2): qty 1

## 2013-05-08 MED ORDER — AMLODIPINE BESYLATE 5 MG PO TABS
5.0000 mg | ORAL_TABLET | Freq: Every day | ORAL | Status: DC
  Administered 2013-05-09: 5 mg via ORAL
  Filled 2013-05-08 (×2): qty 1

## 2013-05-08 MED ORDER — IOHEXOL 300 MG/ML  SOLN: 80.0000 mL | Freq: Once | INTRAMUSCULAR | Status: DC | PRN

## 2013-05-08 MED ORDER — PANTOPRAZOLE SODIUM 40 MG IV SOLR
40.0000 mg | Freq: Every day | INTRAVENOUS | Status: DC
  Administered 2013-05-08: 40 mg via INTRAVENOUS
  Filled 2013-05-08 (×2): qty 40

## 2013-05-08 MED ORDER — ASPIRIN EC 81 MG PO TBEC
81.0000 mg | DELAYED_RELEASE_TABLET | Freq: Every day | ORAL | Status: DC
  Administered 2013-05-09 – 2013-05-14 (×6): 81 mg via ORAL
  Filled 2013-05-08 (×7): qty 1

## 2013-05-08 NOTE — Progress Notes (Signed)
Pt is alert and oriented. Brought up from ED via stretcher. Skin is in tact but dry, with pink skin to his stoma area. Pt is oriented to room with call bell in place. Bed is in lowest position. Pt diastolic BP is 131. Provider Lamar Benes paged and PRN for hydralazine was place. Pt also asked if family could be contacted to let them know that he was in 5W19. Granddaugher Vonita Moss was called and was told his room information. Will continue to monitor.  Telvin Reinders J. Conley Canal RN

## 2013-05-08 NOTE — ED Notes (Signed)
Report called to 5W RN

## 2013-05-08 NOTE — ED Notes (Signed)
Pt's family reports that patient has been failing to thrive and has lost a lot of weight (aprox 40 lb). Pt's family states that patient has been nauseated, having back pain, and coughing up a lot of phlegm through the patient's stoma. Sign around stoma is clean. Pt is nonverbal, but family is able to read his lips.

## 2013-05-08 NOTE — ED Notes (Signed)
MD at bedside. 

## 2013-05-08 NOTE — ED Notes (Signed)
Patient unable to stand for orthostatic vital signs.

## 2013-05-08 NOTE — ED Notes (Signed)
Family states patient normally consumes alcohol, but has not had any to drink in one week. Family reports that patient has been dizzy, but has hx of dizziness. Pt always has cough, but is has increased with phlegm.

## 2013-05-08 NOTE — ED Notes (Signed)
PBT at bedside 

## 2013-05-08 NOTE — ED Notes (Signed)
PBT at bedside. MD informed of hard stick.

## 2013-05-08 NOTE — ED Provider Notes (Signed)
CSN: [id number]     Arrival date & time 05/08/13  1017 History   First MD Initiated Contact with Patient 05/08/13 1150     Chief Complaint  Patient presents with  . Fall  . Weight Loss     (Consider location/radiation/quality/duration/timing/severity/associated sxs/prior Treatment) Patient is a 63 y.o. male presenting with fall.  Fall Associated symptoms include abdominal pain and shortness of breath.   patient brought to the ER by family for evaluation. Patient has a history of alcoholism, throat cancer status post tracheostomy, cirrhosis of the liver as well as hypertension. Patient has had a progressive decline over the past week or so. Initially he was experiencing nausea and vomiting, not eating much. In the last few days, he has simply stopped eating altogether. Family reports that he hasn't gotten out of bed for the last couple of days. When he tries to get out of bed on his own now, he has fallen. He cannot ambulate due to generalized weakness. Patient does report mild lower abdominal pain. Additionally he has had increased cough with thick sputum production that is colored and occasionally bloody.   Past Medical History  Diagnosis Date  . Hypertension   . Throat cancer   . Cirrhosis of liver   . Shortness of breath   . Arthritis    Past Surgical History  Procedure Laterality Date  . Tracheal surgery    . Tracheostomy     No family history on file. History  Substance Use Topics  . Smoking status: Former Smoker -- 0.20 packs/day for 50 years    Types: Cigarettes  . Smokeless tobacco: Never Used  . Alcohol Use: Yes     Comment: "rarely"    Review of Systems  Respiratory: Positive for cough and shortness of breath.   Gastrointestinal: Positive for nausea, vomiting and abdominal pain.  Neurological: Positive for dizziness and weakness.  All other systems reviewed and are negative.      Allergies  Vicodin  Home Medications   Current Outpatient Rx  Name  Route   Sig  Dispense  Refill  . acetaminophen (TYLENOL) 500 MG tablet   Oral   Take 500 mg by mouth daily as needed (pain).         Marland Kitchen amLODipine (NORVASC) 5 MG tablet   Oral   Take 1 tablet (5 mg total) by mouth daily.   180 tablet   3   . atorvastatin (LIPITOR) 20 MG tablet   Oral   Take 1 tablet (20 mg total) by mouth daily.   30 tablet   11   . bisoprolol (ZEBETA) 10 MG tablet   Oral   Take 1 tablet (10 mg total) by mouth daily.   30 tablet   6   . guaiFENesin (MUCINEX) 600 MG 12 hr tablet   Oral   Take by mouth daily as needed (congestion).         . hydrALAZINE (APRESOLINE) 25 MG tablet   Oral   Take 25 mg by mouth daily.         . isosorbide mononitrate (IMDUR) 30 MG 24 hr tablet   Oral   Take 1 tablet (30 mg total) by mouth daily.   30 tablet   3   . levothyroxine (SYNTHROID) 200 MCG tablet   Oral   Take 1 tablet (200 mcg total) by mouth daily.   30 tablet   2   . lisinopril (PRINIVIL,ZESTRIL) 20 MG tablet   Oral   Take  1 tablet (20 mg total) by mouth daily.   30 tablet   6   . megestrol (MEGACE) 400 MG/10ML suspension   Oral   Take 10 mLs (400 mg total) by mouth daily. Dispense enough for one month   240 mL   5   . traMADol (ULTRAM) 50 MG tablet   Oral   Take 50 mg by mouth daily as needed (pain).          BP 117/93  Pulse 98  Temp(Src) 95.2 F (35.1 C) (Rectal)  Resp 25  Ht 5\' 11"  (1.803 m)  Wt 120 lb (54.432 kg)  BMI 16.74 kg/m2  SpO2 95% Physical Exam  Constitutional: He is oriented to person, place, and time. No distress.  Thin, cachectic  HENT:  Head: Normocephalic and atraumatic.  Right Ear: Hearing normal.  Left Ear: Hearing normal.  Nose: Nose normal.  Mouth/Throat: Mucous membranes are dry.  Eyes: Conjunctivae and EOM are normal. Pupils are equal, round, and reactive to light.  Neck: Normal range of motion. Neck supple.  Cardiovascular: Regular rhythm, S1 normal and S2 normal.  Exam reveals no gallop and no friction  rub.   No murmur heard. Pulmonary/Chest: Effort normal and breath sounds normal. No respiratory distress. He exhibits no tenderness.  Abdominal: Soft. Normal appearance and bowel sounds are normal. There is no hepatosplenomegaly. There is no tenderness. There is no rebound, no guarding, no tenderness at McBurney's point and negative Murphy's sign. No hernia.  Musculoskeletal: Normal range of motion.  Neurological: He is alert and oriented to person, place, and time. He has normal strength. No cranial nerve deficit or sensory deficit. Coordination normal. GCS eye subscore is 4. GCS verbal subscore is 5. GCS motor subscore is 6.  Skin: Skin is warm, dry and intact. No rash noted. No cyanosis.     Psychiatric: He has a normal mood and affect. His speech is normal and behavior is normal. Thought content normal.    ED Course  Procedures (including critical care time)  Angiocath insertion Performed by: Kennith Morss J.  Consent: Verbal consent obtained. Risks and benefits: risks, benefits and alternatives were discussed Time out: Immediately prior to procedure a "time out" was called to verify the correct patient, procedure, equipment, support staff and site/side marked as required.  Preparation: Patient was prepped and draped in the usual sterile fashion.  Vein Location: left antecubital fossa  Ultrasound Guided  Gauge: 20G  Normal blood return and flush without difficulty Patient tolerance: Patient tolerated the procedure well with no immediate complications.     Labs Review Labs Reviewed  CBC WITH DIFFERENTIAL - Abnormal; Notable for the following:    HCT 37.9 (*)    MCHC 36.1 (*)    RDW 16.0 (*)    Monocytes Relative 15 (*)    All other components within normal limits  COMPREHENSIVE METABOLIC PANEL - Abnormal; Notable for the following:    BUN 53 (*)    Creatinine, Ser 1.75 (*)    Total Protein 8.9 (*)    AST 40 (*)    Total Bilirubin 2.1 (*)    GFR calc non Af  Amer 41 (*)    GFR calc Af Amer 47 (*)    All other components within normal limits  PRO B NATRIURETIC PEPTIDE - Abnormal; Notable for the following:    Pro B Natriuretic peptide (BNP) 157.7 (*)    All other components within normal limits  URINALYSIS, ROUTINE W REFLEX MICROSCOPIC - Abnormal; Notable  for the following:    Color, Urine AMBER (*)    APPearance CLOUDY (*)    Hgb urine dipstick LARGE (*)    Bilirubin Urine MODERATE (*)    Ketones, ur 15 (*)    Protein, ur 30 (*)    Nitrite POSITIVE (*)    Leukocytes, UA SMALL (*)    All other components within normal limits  URINE MICROSCOPIC-ADD ON - Abnormal; Notable for the following:    Squamous Epithelial / LPF MANY (*)    Bacteria, UA FEW (*)    All other components within normal limits  CULTURE, BLOOD (ROUTINE X 2)  CULTURE, BLOOD (ROUTINE X 2)  URINE CULTURE  TROPONIN I  PROTIME-INR  URINE RAPID DRUG SCREEN (HOSP PERFORMED)  LACTIC ACID, PLASMA  LIPASE, BLOOD  AMMONIA   Imaging Review Ct Head Wo Contrast  05/08/2013   CLINICAL DATA:  Recent falls.  Weakness.  EXAM: CT HEAD WITHOUT CONTRAST  TECHNIQUE: Contiguous axial images were obtained from the base of the skull through the vertex without intravenous contrast.  COMPARISON:  CT 09/24/2012  FINDINGS: Moderate to advanced atrophy. Mild chronic microvascular ischemia. No acute infarct. Negative for hemorrhage or mass. Negative for skull fracture.  IMPRESSION: Moderate to advanced atrophy.  No acute abnormality.   Electronically Signed   By: Franchot Gallo M.D.   On: 05/08/2013 13:37   Dg Chest Port 1 View  05/08/2013   CLINICAL DATA:  Cough and short of breath  EXAM: PORTABLE CHEST - 1 VIEW  COMPARISON:  03/12/2013  FINDINGS: COPD.  Negative for pneumonia or heart failure.  Lungs remain clear.  IMPRESSION: COPD.  No acute abnormality.   Electronically Signed   By: Franchot Gallo M.D.   On: 05/08/2013 12:44     EKG Interpretation   Date/Time:  Thursday May 08 2013 12:21:31  EST Ventricular Rate:  108 PR Interval:  186 QRS Duration: 81 QT Interval:  375 QTC Calculation: 503 R Axis:   50 Text Interpretation:  Sinus tachycardia Biatrial enlargement Nonspecific  ST and T wave abnormality Prolonged QT interval Baseline wander in lead(s)  I No significant change since last tracing Confirmed by Sarabeth Benton  MD,  [place] [phone]) on 05/08/2013 1:35:25 PM      MDM   Final diagnoses:  Dehydration  AKI (acute kidney injury)  Hypothermia    Patient presents to ER for evaluation of generalized weakness. He has had a progressive decline over a period of at least one week. He has not been eating or drinking over the last several days. Patient is cachectic. He has a history of cancer and has a tracheostomy. His vital signs were stable on arrival. His only abnormality was mild hypothermia. He does not appear septic. This might be environmental, or secondary to the patient's extremely thin body habitus. He was placed under a [facility].  Cardiac evaluation negative. CBC unremarkable. Patient does have evidence of acute kidney injury with BUN of 53 and creatinine 1.75. This is consistent with the stated history of very poor and reduced by mouth intake over the period of several days.  Family reports that he does have a history of alcoholism, generally drinks every day. Her last week or so, however, he has not been able to get out of bed and has not had access to alcohol. After one week, he is beyond the timing to be concerned for DTs. Will need to monitor for any signs of withdrawal.  Admitted to the hospital for IV  hydration and further management.     Orpah Greek, MD 05/08/13 [phone]

## 2013-05-08 NOTE — ED Notes (Signed)
Called family to let them know patient's status.

## 2013-05-08 NOTE — ED Notes (Signed)
Pt. seen from worklist for next shift, 96% r/a in no distress Stoma clean/WNL b/l b.s. Clear/slight diminished.

## 2013-05-08 NOTE — ED Notes (Signed)
[name] (Daughter/caretaker)- [phone] Vonita Moss (Grandaugher/secondary caretaker)- [phone]

## 2013-05-08 NOTE — ED Notes (Signed)
Pt placed on bare hugger.

## 2013-05-08 NOTE — H&P (Signed)
[facility] Admission History and Physical Service Pager: [phone]  Patient name: [name] Medical record number: [id number] Date of birth: 11/19/1950 Age: 63 y.o. Gender: male  Primary Care Provider: Howard Pouch, DO Consultants: none Code Status: Full  Chief Complaint: nausea  Assessment and Plan: [name] is a 63 y.o. male presenting with generalized weakness, fall, nausea, and dehydration. PMH is significant for larnygeal cancer s/p laryngectomy 2009, CHF HFrEF 30%, HTN, HLD, cirrhosis of liver, history of alcoholism, osteoarthritis.   # Nausea/generalized weakness/weight loss: likely secondary to malnutrition from poor oral intake. On megace to help with appetite at home. Denies any pain. History of laryngeal cancer in 2009, concerning for relapse with nonspecific symptoms other than weight loss and decreased appetite. THough pt was T3N0M0 w/ complete resection and radiation.  Vitals with tachycardia, temperature 95.63F. Initial workup in ED with normal electrolytes, BUN 53, Cr 1.75, Total protein 8.9, albumin 3.5, total bilirubin 2.1, normal CBC (may be hemoconcentrated with Hgb 13.7 above apparent baseline 11-12), BNP 157.7, UDS negative, UA nitrites positive with small leuks, EKG with prolonged QTc but unchanged from prior, CXR with no acute abnormality (COPD appearance). Weight 120lbs on admission, down from 144lbs in office visit from Sept 2014.  - admit to inpatient  - IV protonix - continue megace, has been on remeron in the past. - ensure TID between meals.  - pending Urine and Blood cultures - AM labs = CBC, Bmet, TSH - nutrition consult - PT/OT eval - +/- ENT consult as pt not following up  # UA with nitrites, small leukocytes: microscopy with many squamous epi cells; likely a dirty catch. No complaints of dysuria and well appearing. Based on McGreer criteria will not treat at this time - follow up urine culture, may need to repeat  with cath sample given sample was likely contaminated - low threshold for starting ABX  # CHF, HFrEF: does not currently appear fluid overloaded, no CP and not SOB. - continue bisoprolol, imdur - gentle IV hydration  # Hypertension: home meds bisoprolol, hydralazine, lisinopril. BP 110s-140s/80s-110 in ED. - continue bisoprolol, lisinopril, amlodipine, hold hydralazine - if BP remains elevated add hydralazine  # Cirrhosis: pt still w/ adequate function given protein/albumin and bili levels w/ nml LFTs and nml INR. Last ETOH several wks ago. - follow up ammonia - start CIWA   # Hyperlipidemia:  - continue lipitor 20mg   # Hypothyroidism - continue home synthroid - follow up TSH in the AM  # Fall: denies symptoms, neurologically intact. CT head in the ED with no acute abnormality, moderate to advanced atrophy. See above - will continue to monitor clinically, if worsens consider repeat CT head for slow subdural bleed.  # Osteoarthritis: - continue tramadol PRN  FEN/GI: 1/2NS at 75cc/hr, soft diet, ensure supplements - consider swallow eval if shows any difficulty w/ feeding  Prophylaxis: heparin  Disposition: admit to inpatient  History of Present Illness: [name] is a 63 y.o. male presenting with generalized weakness and fall, with nausea and decreased oral intake for the past 2 weeks. He has a tracheostomy that makes it difficult for him to speak. When asked what brings him to the hospital he states his family. He complains primarily of nausea and decreased appetite, writes on paper "I was eating good then it stop", says 2 weeks ago. Denies any vomiting, pain (including abdominal pain, CP), denies heart burn. Says he voids and stools 2-3 times a day, no changes  including no diarrhea or constipation, blood in urine or stool. He is very aware that he is getting thinner, grabs his arm to show how small it is. He has felt weak since he stopped eating, and has not gotten out of  bed much and today when he did he fell, denies striking his head or pain anywhere. He denies feeling depressed, SI or HI. His wife did pass away about 2 years ago. While in ED he is asking if he can eat food and coffee, currently feels hungry. At home he does drink protein shakes.  Review Of Systems: Per HPI with the following additions: endorses weight loss, denies fevers, currently feels cold, no SOB Otherwise 12 point review of systems was performed and was unremarkable.  Patient Active Problem List   Diagnosis Date Noted  . Dehydration 05/08/2013  . Chest pain 03/12/2013  . Loss of weight 03/12/2013  . Sore throat 02/07/2013  . Pain in joint, upper arm 11/28/2012  . Elevated serum creatinine 11/28/2012  . Chronic systolic heart failure 35/32/9924  . Alcohol dependence 01/04/2012  . Decreased appetite 01/03/2012  . Acute and chronic respiratory failure 12/26/2011  . Acute systolic HF (heart failure) 12/26/2011  . Dyspnea 11/10/2011  . Allergic rhinitis 06/22/2011  . Hypothyroidism 10/16/2010  . NEOPLASM, MALIGNANT, LARYNX 01/10/2010  . PULMONARY NODULE 12/31/2007  . ESSENTIAL HYPERTENSION, BENIGN 11/08/2007   Past Medical History: Past Medical History  Diagnosis Date  . Hypertension   . Throat cancer   . Cirrhosis of liver   . Shortness of breath   . Arthritis    Past Surgical History: Past Surgical History  Procedure Laterality Date  . Tracheal surgery    . Tracheostomy     Social History: History  Substance Use Topics  . Smoking status: Former Smoker -- 0.20 packs/day for 50 years    Types: Cigarettes  . Smokeless tobacco: Never Used  . Alcohol Use: Yes     Comment: "rarely"   Additional social history: none Please also refer to relevant sections of EMR.  Family History: No family history on file. Allergies and Medications: Allergies  Allergen Reactions  . Vicodin [Hydrocodone-Acetaminophen] Rash   No current facility-administered medications on file  prior to encounter.   Current Outpatient Prescriptions on File Prior to Encounter  Medication Sig Dispense Refill  . amLODipine (NORVASC) 5 MG tablet Take 1 tablet (5 mg total) by mouth daily.  180 tablet  3  . atorvastatin (LIPITOR) 20 MG tablet Take 1 tablet (20 mg total) by mouth daily.  30 tablet  11  . bisoprolol (ZEBETA) 10 MG tablet Take 1 tablet (10 mg total) by mouth daily.  30 tablet  6  . isosorbide mononitrate (IMDUR) 30 MG 24 hr tablet Take 1 tablet (30 mg total) by mouth daily.  30 tablet  3  . levothyroxine (SYNTHROID) 200 MCG tablet Take 1 tablet (200 mcg total) by mouth daily.  30 tablet  2  . lisinopril (PRINIVIL,ZESTRIL) 20 MG tablet Take 1 tablet (20 mg total) by mouth daily.  30 tablet  6  . megestrol (MEGACE) 400 MG/10ML suspension Take 10 mLs (400 mg total) by mouth daily. Dispense enough for one month  240 mL  5    Objective: BP 117/93  Pulse 98  Temp(Src) 95.2 F (35.1 C) (Rectal)  Resp 25  Ht 5\' 11"  (1.803 m)  Wt 120 lb (54.432 kg)  BMI 16.74 kg/m2  SpO2 95% Exam: General: NAD, thin appearing, smells of urine  incontinence HEENT: PERRL, EOMI. Oral mucosa dry (opens mouth and saliva bubble covers whole mouth) Neck: tracheostomy clean  Cardiovascular: RRR borderline tachy, normal heart sounds, no murmur Respiratory: decreased air movement throughout, a few scattered wheezes Abdomen: soft, thin, nontender to palpation, nondistended, no organomegaly, no suprapubic pain Extremities: no edema or cyanosis. Lower limbs somewhat cool to touch. Arms and legs appear cachectic. Skin: cool, very dry MSK: no CVA tenderness Neuro: alert and oriented. CN2-12 normal. Strength 5/5 in grip, biceps, triceps, hip flexion.  Labs and Imaging: CBC BMET   Recent Labs Lab 05/08/13 1213  WBC 5.8  HGB 13.7  HCT 37.9*  PLT 165    Recent Labs Lab 05/08/13 1213  NA 140  K 3.7  CL 98  CO2 20  BUN 53*  CREATININE 1.75*  GLUCOSE 92  CALCIUM 9.7     Ct Head Wo  Contrast  05/08/2013   CLINICAL DATA:  Recent falls.  Weakness.  EXAM: CT HEAD WITHOUT CONTRAST  TECHNIQUE: Contiguous axial images were obtained from the base of the skull through the vertex without intravenous contrast.  COMPARISON:  CT 09/24/2012  FINDINGS: Moderate to advanced atrophy. Mild chronic microvascular ischemia. No acute infarct. Negative for hemorrhage or mass. Negative for skull fracture.  IMPRESSION: Moderate to advanced atrophy.  No acute abnormality.   Electronically Signed   By: Franchot Gallo M.D.   On: 05/08/2013 13:37   Dg Chest Port 1 View  05/08/2013   CLINICAL DATA:  Cough and short of breath  EXAM: PORTABLE CHEST - 1 VIEW  COMPARISON:  03/12/2013  FINDINGS: COPD.  Negative for pneumonia or heart failure.  Lungs remain clear.  IMPRESSION: COPD.  No acute abnormality.   Electronically Signed   By: Franchot Gallo M.D.   On: 05/08/2013 12:44    Tawanna Sat, MD 05/08/2013, 6:35 PM PGY-1, [facility] Intern pager: [phone], text pages welcome   -----------------------------------------------------------------------------------  Upper Level Addendum  Pt seen and examined w/ Dr. Lamar Benes and agree w/ above H&P w/ additions noted in [place], MD Family Medicine PGY-3 05/08/2013, 9:02 PM

## 2013-05-08 NOTE — Progress Notes (Signed)
RT assessed patient with stoma. Patient stated his trach had been out for 3 years. Vitals within normal limits and no apparent distress at this time. Patient was instructed to notify his nurse if he needed respiratory. RT will continue to monitor.

## 2013-05-08 NOTE — ED Notes (Addendum)
PT's grandaughter with pt and reports PT has not been eating for the past few weeks, but has been consuming liquids. PT reports he is not hungry. PT has had mucous and blood coming out of his stoma site for ~3 days as well. PT has had been falling @ home the past few days. He feels like he has lost >10lbs in 1 month and appears malnourished (very thin).

## 2013-05-08 NOTE — ED Notes (Signed)
Pt is at CT. Will call PBT for blood when patient returns.

## 2013-05-09 ENCOUNTER — Encounter (HOSPITAL_COMMUNITY): Payer: Self-pay | Admitting: Family Medicine

## 2013-05-09 DIAGNOSIS — F1020 Alcohol dependence, uncomplicated: Secondary | ICD-10-CM

## 2013-05-09 DIAGNOSIS — R634 Abnormal weight loss: Secondary | ICD-10-CM

## 2013-05-09 DIAGNOSIS — E860 Dehydration: Principal | ICD-10-CM

## 2013-05-09 DIAGNOSIS — R630 Anorexia: Secondary | ICD-10-CM

## 2013-05-09 LAB — CBC
HCT: 36.2 % — ABNORMAL LOW (ref 39.0–52.0)
Hemoglobin: 12.7 g/dL — ABNORMAL LOW (ref 13.0–17.0)
MCH: 30.2 pg (ref 26.0–34.0)
MCHC: 35.1 g/dL (ref 30.0–36.0)
MCV: 86.2 fL (ref 78.0–100.0)
Platelets: 142 10*3/uL — ABNORMAL LOW (ref 150–400)
RBC: 4.2 MIL/uL — ABNORMAL LOW (ref 4.22–5.81)
RDW: 16.3 % — AB (ref 11.5–15.5)
WBC: 4.6 10*3/uL (ref 4.0–10.5)

## 2013-05-09 LAB — BASIC METABOLIC PANEL
BUN: 49 mg/dL — ABNORMAL HIGH (ref 6–23)
CO2: 19 mEq/L (ref 19–32)
CREATININE: 1.67 mg/dL — AB (ref 0.50–1.35)
Calcium: 9.1 mg/dL (ref 8.4–10.5)
Chloride: 99 mEq/L (ref 96–112)
GFR calc non Af Amer: 43 mL/min — ABNORMAL LOW (ref >90)
GFR, EST AFRICAN AMERICAN: 50 mL/min — AB (ref >90)
Glucose, Bld: 84 mg/dL (ref 70–99)
Potassium: 3.5 mEq/L — ABNORMAL LOW (ref 3.7–5.3)
Sodium: 137 mEq/L (ref 137–147)

## 2013-05-09 LAB — URINE CULTURE
COLONY COUNT: NO GROWTH
CULTURE: NO GROWTH

## 2013-05-09 LAB — TSH: TSH: 99.937 u[IU]/mL — AB (ref 0.350–4.500)

## 2013-05-09 LAB — AMMONIA: Ammonia: 30 umol/L (ref 11–60)

## 2013-05-09 MED ORDER — ONDANSETRON HCL 4 MG PO TABS
4.0000 mg | ORAL_TABLET | Freq: Three times a day (TID) | ORAL | Status: DC
  Administered 2013-05-09 – 2013-05-14 (×17): 4 mg via ORAL
  Filled 2013-05-09 (×21): qty 1

## 2013-05-09 MED ORDER — SODIUM CHLORIDE 0.9 % IV BOLUS (SEPSIS)
1000.0000 mL | Freq: Once | INTRAVENOUS | Status: AC
  Administered 2013-05-09: 1000 mL via INTRAVENOUS

## 2013-05-09 MED ORDER — POTASSIUM CHLORIDE CRYS ER 20 MEQ PO TBCR
40.0000 meq | EXTENDED_RELEASE_TABLET | Freq: Once | ORAL | Status: AC
  Administered 2013-05-09: 40 meq via ORAL
  Filled 2013-05-09: qty 2

## 2013-05-09 MED ORDER — PANTOPRAZOLE SODIUM 40 MG PO TBEC
40.0000 mg | DELAYED_RELEASE_TABLET | Freq: Every day | ORAL | Status: DC
  Administered 2013-05-09 – 2013-05-13 (×5): 40 mg via ORAL
  Filled 2013-05-09 (×5): qty 1

## 2013-05-09 MED ORDER — LEVOTHYROXINE SODIUM 125 MCG PO TABS
250.0000 ug | ORAL_TABLET | Freq: Every day | ORAL | Status: DC
  Administered 2013-05-09 – 2013-05-14 (×6): 250 ug via ORAL
  Filled 2013-05-09 (×8): qty 2

## 2013-05-09 MED ORDER — WHITE PETROLATUM GEL
Status: AC
  Filled 2013-05-09: qty 5

## 2013-05-09 NOTE — H&P (Addendum)
FMTS ATTENDING ADMISSION NOTE Ravindra Baranek,MD I  have seen and examined this patient, reviewed their chart. I have discussed this patient with the resident. I agree with the resident's findings, assessment and care plan.  Briefly, this is a 63 Y/O Male with PMX of HTN,Laryngeal CA S/P resection,pulmonary nodule,CHF, presented to the hospital for hx of fall at home,according to patient he has been falling more often in the past few months,he denies any head injury,no fainting or LOC,no seizures, he stated he normally ambulates at home by himself with no support but has become weaker in the last few days. He also mentioned his appetite has been poor hence has not been eating or drinking well at home, he denies any other GI symptoms.Denies fever,no respiratory or cardiovascular symptoms,he endorsed weight loss over the last few months.  Filed Vitals:   05/08/13 1930 05/08/13 2011 05/08/13 2202 05/09/13 0619  BP: 140/109 153/114 122/88 114/85  Pulse:    97  Temp:  97.5 F (36.4 C)  97.7 F (36.5 C)  TempSrc:  Oral  Oral  Resp: 15 18  15   Height:  5\' 11"  (1.803 m)    Weight:  102 lb 15.3 oz (46.7 kg)  103 lb 2.8 oz (46.8 kg)  SpO2:  100%  95%   Exam: Gen: Calm in bed,not in distress. HEENT: EOMI,PERRLA,+ stoma at the base of his neck anteriorly. Neuro: Awake and alert, oriented X 3, CN grossly intact. CV: S1 S2 normal,RRR,no murmurs. Resp: Air entry equal and clear B/L. Abd: Soft, flat,NT,BS+ Ext: No edema.  A/P; 63 Y/O M with  1. Frequent fall/ generalized weakness:     R/O postural hypotension,vs dehydration vs infection (UTI).     CT head reviewed,no intracranial pathology suggestive of frequent fall.     Hemoglobin just few points below normal,not sufficient enough to cause dizziness leading to fall.     Orthostatic BP measured reviewed.BP dropped some with sitting from lying,HR not measured at the time.     Fall precaution.     Continue IV hydration till he improved his oral  intake.    PT/OP recommended.    Bmet, CBC.  2. Weight loss: Likely a cause of his weakness due to poor oral intake.     Nutritionist consult recommended.     I agree with ensure TID.  3. ?? UTI, patient asymptomatic.     Urine culture pending, consider treating if culture +.     Monitor vital sign, if becomes febrile may treat for UTI as well.  4.Alcoholism: On CIWA protocol.    I recommend MVI, Thiamine and Folate as well.   Might help improve his nutritional status.  5. Laryngeal CA: S/P resection.    May be contributing to his poor oral intake.    Will benefit from swallow evaluation.    ENT assess as outpatient.  6. Other chronic conditions, continue home regimen.

## 2013-05-09 NOTE — Progress Notes (Signed)
MD at bedside. 

## 2013-05-09 NOTE — Progress Notes (Signed)
Occupational Therapy Evaluation Patient Details Name: [name] MRN: [id number] DOB: [birth year] Today's Date: 05/09/2013 Time: 6812-7517 OT Time Calculation (min): 18 min  OT Assessment / Plan / Recommendation History of present illness [name] is a 63 y.o. male presenting with generalized weakness, fall, nausea, and dehydration. PMH is significant for larnygeal cancer s/p laryngectomy 2009, CHF HFrEF 30%, HTN, HLD, cirrhosis of liver, history of alcoholism, osteoarthritis.    Clinical Impression   Patient presents with decreased ADL independence and safety due to weakness, will benefit from OT to maximize independence. Patient may need short term SNF.     OT Assessment  Patient needs continued OT Services    Follow Up Recommendations  Other (comment) (short term SNF vs HHOT and 24/7 A)    Barriers to Discharge Decreased caregiver support dtr works  Optician, dispensing   (tbd)    Recommendations for Other Services    Frequency  Min 2X/week    Precautions / Restrictions Precautions Precautions: Fall Restrictions Weight Bearing Restrictions: No   Pertinent Vitals/Pain No c/o pain    ADL  Eating/Feeding: Set up Where Assessed - Eating/Feeding: Bed level Grooming: Performed;Wash/dry hands;Wash/dry face;Set up Where Assessed - Grooming: Supine, head of bed up Upper Body Bathing: Simulated;Minimal assistance Where Assessed - Upper Body Bathing: Supine, head of bed up Transfers/Ambulation Related to ADLs: After eval was started and it was time to try to sit EOB, pt reported he was on the bedpan and needed to stay on it "for a while." Mobility NT. ADL Comments: Limited eval, but pt able to perform some bed level activities. Reports he is weaker than usual and not at baseline.    OT Diagnosis: Generalized weakness  OT Problem List: Decreased strength;Decreased activity tolerance OT Treatment Interventions: Self-care/ADL training;Therapeutic exercise;DME and/or  AE instruction;Therapeutic activities;Patient/family education   OT Goals(Current goals can be found in the care plan section) Acute Rehab OT Goals Patient Stated Goal: none stated OT Goal Formulation: With patient Time For Goal Achievement: 05/23/13 Potential to Achieve Goals: Good  Visit Information  Last OT Received On: 05/09/13 History of Present Illness: [name] is a 63 y.o. male presenting with generalized weakness, fall, nausea, and dehydration. PMH is significant for larnygeal cancer s/p laryngectomy 2009, CHF HFrEF 30%, HTN, HLD, cirrhosis of liver, history of alcoholism, osteoarthritis.        Prior [place] expects to be discharged to:: Private residence Living Arrangements: Children Available Help at Discharge: Family;Available PRN/intermittently Type of Home: House Home Layout: Two level Alternate Level Stairs-Number of Steps: flight Home Equipment: None Prior Function Level of Independence: Independent Comments: he reports he occasionally gets dizzy and needs assistance Communication Communication: Other (comment) (stoma from previous trach, can mouth words only) Dominant Hand: Right         Vision/Perception Vision - History Baseline Vision: No visual deficits Patient Visual Report: No change from baseline   Cognition  Cognition Arousal/Alertness: Awake/alert Behavior During Therapy: WFL for tasks assessed/performed Overall Cognitive Status: Within Functional Limits for tasks assessed    Extremity/Trunk Assessment Upper Extremity Assessment Upper Extremity Assessment: Overall WFL for tasks assessed Lower Extremity Assessment Lower Extremity Assessment: Defer to PT evaluation     End of Session OT - End of Session Activity Tolerance: Patient tolerated treatment well (limited eval) Patient left: in bed;with call bell/phone within reach  GO     [name] A 05/09/2013, 10:08 AM

## 2013-05-09 NOTE — Progress Notes (Deleted)
MD at bedside. 

## 2013-05-09 NOTE — Progress Notes (Addendum)
Interval note  Called to bedside by RN for manual blood pressure of 60/38. Patient states that he feels weak but denies any dizziness, he is alert and oriented to person place and time. He speaks by mouthing words as he has a history of a laryngeal cancer resected previously with a patent stoma.  Nurse states that family reported confusion and dizziness during a transfer earlier.   On review his medications he was given amlodipine, bisoprolol, Imdur, and lisinopril today. He was also found to have a TSH of 99 and given a dose of 250 mcg of Synthroid at that time.  Started 1 L bolus of IV NS and call to discuss the case with the critical care doctor on call. Hypertension is most likely due to the medications he has been receiving, will hold home in the morning. Currently he is mentating well despite his hypotension, all bolus and 1-2 L repeat serial blood pressures, Intal CCM again if his blood pressures do respond fluid.  Will transfer to step-down unit for closer monitoring  Laroy Apple, MD [place] Resident, PGY-2 05/09/2013, 10:23 PM   Pt moved to step down, notified by RN that BP is up to 72/54. He is still mentating well, will bolus X 1 more liter.   Laroy Apple, MD [place] Resident, PGY-2 05/10/2013, 12:00 AM

## 2013-05-09 NOTE — Progress Notes (Signed)
Utilization review completed.  

## 2013-05-09 NOTE — Progress Notes (Signed)
Pts BP on the dinamap showed 45/27. A manual BP was taken which showed 60/38. The patients family also noted a new onset of confusion. The patient was also complaining of dizziness. Dr. Wendi Snipes paged and notified. 1L NS bolus was ordered.

## 2013-05-09 NOTE — Progress Notes (Signed)
[name] daughter [name] notified that patient will be moving to 2C18.

## 2013-05-09 NOTE — Progress Notes (Signed)
INITIAL NUTRITION ASSESSMENT  Pt meets criteria for SEVERE malnutrition in the context of chronic illness as evidenced by weight loss of 19 % in 3 months and severe fat and muscle mass depletion.  DOCUMENTATION CODES Per approved criteria  -Severe malnutrition in the context of chronic illness -Underweight   INTERVENTION: Continue Ensure Complete BID between meals, each container provides 350 kcals and 13 grams of protein.  Continue with Megace.  Pt at risk for refeeding syndrome, monitor phosphorus, potassium, and magnesium labs.  NUTRITION DIAGNOSIS: Inadequate oral intake related to decreased appetite as evidenced by meal completion 25%.   Goal: Pt to meet >/= 90% of their estimated nutrition needs.  Monitor:  PO intake, weight trends, labs, I/O's  Reason for Assessment: Consult, MST  63 y.o. male  Admitting Dx: Dehydration, weakness  ASSESSMENT: 63 y.o. male presenting with generalized weakness, fall, nausea, and dehydration. PMH is significant for larnygeal cancer s/p laryngectomy 2009, CHF HFrEF 30%, HTN, HLD, cirrhosis of liver, history of alcoholism, osteoarthritis. Pt present with nausea, generalized weakness, and weight loss secondary to poor oral intake.   Pt unable to verbalize due to stoma in trachea. Pt communicated with either head nods, shakes, or lip reading. Pt reports he has been eating poorly at home due to a decreased appetite and occasional nausea and dizziness. Pt currently denies any stomach pains or nausea, but reports he currently gets dizzy when he tries to get up from his bed. Pt reports losing weight. Pt reports usual body weight of 150 lbs. Pt reports feeling very weak and tired. Pt reports he hasn't been eating the past couple of days only drinking some liquids. Pt reports drinking ensure at home and likes it. Pt reports currently has an appetite. Pt requested another Ensure--brought directly to bedside. Pt reports he ate pretty good for breakfast,  however per EPIC report pt with meal completion of 25%. Pt was encouraged to eat more at meals to prevent further weight loss.  Nutrition Focused Physical Exam:  Subcutaneous Fat:  Orbital Region: N/A Upper Arm Region: Severe depletion Thoracic and Lumbar Region: Severe depletion  Muscle:  Temple Region: Severe depletion Clavicle Bone Region: Severe depletion Clavicle and Acromion Bone Region: Severe depletion Scapular Bone Region: N/A Dorsal Hand: Severe depletion Patellar Region: Severe depletion Anterior Thigh Region: Severe depletion Posterior Calf Region: Severe depletion  Edema: none  Labs: Low potassium (repleted with potassium chloride) and GFR High BUN and creatinine  Height: Ht Readings from Last 1 Encounters:  05/08/13 5\' 11"  (1.803 m)    Weight: Wt Readings from Last 1 Encounters:  05/09/13 103 lb 2.8 oz (46.8 kg)    Ideal Body Weight: 172 lb  % Ideal Body Weight: 60%  Wt Readings from Last 10 Encounters:  05/09/13 103 lb 2.8 oz (46.8 kg)  03/12/13 127 lb (57.607 kg)  02/07/13 134 lb (60.782 kg)  12/06/12 146 lb 12.8 oz (66.588 kg)  11/28/12 144 lb (65.318 kg)  11/25/12 150 lb (68.04 kg)  05/09/12 141 lb 12.8 oz (64.32 kg)  03/28/12 140 lb 8 oz (63.73 kg)  03/07/12 142 lb 12.8 oz (64.774 kg)  02/15/12 135 lb 12.8 oz (61.598 kg)    Usual Body Weight: 150 lb  % Usual Body Weight: 69%  BMI:  Body mass index is 14.4 kg/(m^2). Underweight  Estimated Nutritional Needs: Kcal: 1700-1900 Protein: 80-90 grams Fluid: 1.7-1.9 L/day  Skin: no issues noted, stoma on trach  Diet Order: Criss Rosales soft diet  EDUCATION NEEDS: -No education needs  identified at this time   Intake/Output Summary (Last 24 hours) at 05/09/13 0849 Last data filed at 05/09/13 3382  Gross per 24 hour  Intake 713.75 ml  Output    150 ml  Net 563.75 ml    Last BM: 3/5   Labs:   Recent Labs Lab 05/08/13 1213 05/09/13 0552  NA 140 137  K 3.7 3.5*  CL 98 99  CO2 20  19  BUN 53* 49*  CREATININE 1.75* 1.67*  CALCIUM 9.7 9.1  GLUCOSE 92 84    CBG (last 3)  No results found for this basename: GLUCAP,  in the last 72 hours  Scheduled Meds: . amLODipine  5 mg Oral Daily  . aspirin EC  81 mg Oral Daily  . atorvastatin  20 mg Oral Daily  . bisoprolol  10 mg Oral Daily  . feeding supplement (ENSURE COMPLETE)  237 mL Oral BID BM  . heparin  5,000 Units Subcutaneous 3 times per day  . isosorbide mononitrate  30 mg Oral Daily  . levothyroxine  200 mcg Oral QAC breakfast  . lisinopril  20 mg Oral Daily  . megestrol  400 mg Oral Daily  . ondansetron  4 mg Oral 3 times per day  . pantoprazole (PROTONIX) IV  40 mg Intravenous QHS  . potassium chloride  40 mEq Oral Once    Continuous Infusions: . sodium chloride 75 mL/hr at 05/08/13 2007    Past Medical History  Diagnosis Date  . Hypertension   . Laryngeal cancer 2011    Laryngectomy, T3N0M0  . Cirrhosis of liver   . Arthritis   . Hypothyroidism   . Hyperlipidemia   . Alcohol abuse   . CHF (congestive heart failure)     EF 45-50% 05/2012    Past Surgical History  Procedure Laterality Date  . Tracheal surgery    . Tracheostomy      Kallie Locks Dietetic Intern Pager: [phone]  I agree with the above information and made appropriate revisions. Inda Coke MS, RD, LDN Inpatient Registered Dietitian Pager: [phone] After-hours pager: [phone]

## 2013-05-09 NOTE — Progress Notes (Signed)
Patient case discussed with resident team and I agree with Dr Marissa Calamity note for today. Plan to get in touch with his ENT for discussion about subsequent follow-up. Continue IV rehydration and watch Cr (appears pre-renal).  For CT evaluation head/neck, chest, abd/pelvis in setting of 40# weight loss and prior history of laryngeal cancer.  To await PT evaluation and recommendations, assessment of patient's home living situation to help determine his eventual disposition upon discharge. Dalbert Mayotte, MD

## 2013-05-09 NOTE — Progress Notes (Signed)
Family Medicine Teaching Service Daily Progress Note Intern Pager: [phone]  Patient name: [name] Medical record number: [id number] Date of birth: 01/11/1951 Age: 63 y.o. Gender: male  Primary Care Provider: Howard Pouch, DO Consultants: none Code Status: Full  Pt Overview and Major Events to Date:   Assessment and Plan: [name] is a 63 y.o. male presenting with generalized weakness, fall, nausea, and dehydration. PMH is significant for larnygeal cancer s/p laryngectomy 2009, CHF HFrEF 45-50%, HTN, HLD, cirrhosis of liver, history of alcoholism, osteoarthritis.   # Nausea/generalized weakness/weight loss: likely secondary to malnutrition from poor oral intake. On megace to help with appetite at home. Denies any pain. History of laryngeal cancer in 2009, concerning for relapse with nonspecific symptoms other than weight loss and decreased appetite. Though pt was T3N0M0 w/ complete resection and radiation. Vitals with tachycardia, temperature 95.42F. Initial workup in ED with normal electrolytes, BUN 53, Cr 1.75, Total protein 8.9, albumin 3.5, total bilirubin 2.1, normal CBC (may be hemoconcentrated with Hgb 13.7 above apparent baseline 11-12), BNP 157.7, UDS negative, UA nitrites positive with small leuks, EKG with prolonged QTc but unchanged from prior, CXR with no acute abnormality (COPD appearance). Weight 120lbs on admission, down from 144lbs in office visit from Sept 2014.  - IV protonix  - continue megace, has been on remeron in the past.  - did not receive PRN zofran, schedule 4mg  zofran q8hrs - ensure TID between meals.  - pending Urine and Blood cultures  - nutrition consult  - PT/OT eval  - +/- ENT consult as pt not following up   # UA with nitrites, small leukocytes: microscopy with many squamous epi cells; likely a dirty catch. No complaints of dysuria and well appearing. Based on McGreer criteria will not treat at this time  - follow up urine culture, may need  to repeat with cath sample given sample was likely contaminated  - low threshold for starting ABX   # CHF, HFrEF 45-50% March 2014: does not currently appear fluid overloaded, no CP and not SOB.  - continue bisoprolol, imdur  - gentle IV hydration   # Hypertension: home meds bisoprolol, hydralazine, lisinopril. BP 110s-140s/80s-110 in ED.  - continue bisoprolol, lisinopril, amlodipine, hold home daily hydralazine  - PRN IV hydralazine for SBP >180, DBP >110  # Cirrhosis: pt still w/ adequate function given protein/albumin and bili levels w/ nml LFTs and nml INR. Last ETOH several wks ago.  - follow up ammonia  - start CIWA   # Hypokalemia, mild: 3.7 on admission, 3.5 this morning - replete 52mEq kdur x 1  # Hyperlipidemia:  - continue lipitor 20mg    # Hypothyroidism:  - continue home synthroid  - TSH 99.9 today  # Fall: denies symptoms, neurologically intact. CT head in the ED with no acute abnormality, moderate to advanced atrophy. See above  - will continue to monitor clinically, if worsens consider repeat CT head for slow subdural bleed.   # Osteoarthritis:  - continue tramadol PRN   FEN/GI: 1/2NS at 75cc/hr, soft diet, ensure supplements  Prophylaxis: heparin  Disposition: pending clinical improvement  Subjective:  Complains of nausea that is unchanged from yesterday, still feels a little cold. Has some pain in abdomen and left lower chest that hurts when pushed on. Denies pain on urination, has gone 5-6 times throughout the night.  Objective: Temp:  [95.2 F (35.1 C)-97.7 F (36.5 C)] 97.7 F (36.5 C) (03/06 0619) Pulse Rate:  [88-98] 97 (03/06 0619)  Resp:  [15-25] 15 (03/06 0619) BP: (113-153)/(78-114) 114/85 mmHg (03/06 0619) SpO2:  [95 %-100 %] 95 % (03/06 0619) Weight:  [102 lb 15.3 oz (46.7 kg)-120 lb (54.432 kg)] 103 lb 2.8 oz (46.8 kg) (03/06 1308) Physical Exam: General: NAD, sitting up in bed eating some breakfast (not much gone from plate), quiet  (almost absent) voice at baseline HEENT: PERRL, EOMI, oral mucosa dry (improved) Neck: stoma pink, hypopigmented skin around stoma, no secretions. Cardiovascular: RRR (borderline tachy), normal heart sounds, no murmur Chest wall: tenderness to palp anterior lower left ribs, no skin lesions Respiratory: CTAB, normal effort Abdomen: soft, thin, tender primarily epigastrium Extremities: no edema or cyanosis Neuro: alert and oriented, no focal deficits  Laboratory:  Recent Labs Lab 05/08/13 1213 05/09/13 0552  WBC 5.8 4.6  HGB 13.7 12.7*  HCT 37.9* 36.2*  PLT 165 142*    Recent Labs Lab 05/08/13 1213 05/09/13 0552  NA 140 137  K 3.7 3.5*  CL 98 99  CO2 20 19  BUN 53* 49*  CREATININE 1.75* 1.67*  CALCIUM 9.7 9.1  PROT 8.9*  --   BILITOT 2.1*  --   ALKPHOS 94  --   ALT 39  --   AST 40*  --   GLUCOSE 92 84    Imaging/Diagnostic Tests: Ct Head Wo Contrast  05/08/2013   CLINICAL DATA:  Recent falls.  Weakness.  EXAM: CT HEAD WITHOUT CONTRAST  TECHNIQUE: Contiguous axial images were obtained from the base of the skull through the vertex without intravenous contrast.  COMPARISON:  CT 09/24/2012  FINDINGS: Moderate to advanced atrophy. Mild chronic microvascular ischemia. No acute infarct. Negative for hemorrhage or mass. Negative for skull fracture.  IMPRESSION: Moderate to advanced atrophy.  No acute abnormality.   Electronically Signed   By: Franchot Gallo M.D.   On: 05/08/2013 13:37   Dg Chest Port 1 View  05/08/2013   CLINICAL DATA:  Cough and short of breath  EXAM: PORTABLE CHEST - 1 VIEW  COMPARISON:  03/12/2013  FINDINGS: COPD.  Negative for pneumonia or heart failure.  Lungs remain clear.  IMPRESSION: COPD.  No acute abnormality.   Electronically Signed   By: Franchot Gallo M.D.   On: 05/08/2013 12:44    Tawanna Sat, MD 05/09/2013, 7:41 AM PGY-1, [place] Intern pager: [phone], text pages welcome

## 2013-05-09 NOTE — Evaluation (Signed)
Physical Therapy Evaluation Patient Details Name: [name] MRN: [id number] DOB: [birth year] Today's Date: 05/09/2013 Time: 7846-9629 PT Time Calculation (min): 18 min  PT Assessment / Plan / Recommendation History of Present Illness  63 y.o. male presenting with generalized weakness, fall, nausea, and dehydration. PMH is significant for larnygeal cancer s/p laryngectomy 2009, CHF HFrEF 45-50%, HTN, HLD, cirrhosis of liver, history of alcoholism, osteoarthritis.  Clinical Impression  Pt admitted with above. Pt currently with functional limitations due to the deficits listed below (see PT Problem List).  Pt will benefit from skilled PT to increase their independence and safety with mobility to allow discharge to the venue listed below. Feel pt can benefit from ST-SNF prior to return home. Pt wants to talk to daughter about this option.      PT Assessment  Patient needs continued PT services    Follow Up Recommendations  SNF (unless family able to provide 24 hour assist)    Does the patient have the potential to tolerate intense rehabilitation      Barriers to Discharge        Equipment Recommendations  Rolling walker with 5" wheels    Recommendations for Other Services     Frequency Min 3X/week    Precautions / Restrictions Precautions Precautions: Fall   Pertinent Vitals/Pain See flow sheet.      Mobility  Bed Mobility Overal bed mobility: Needs Assistance Bed Mobility: Supine to Sit;Sit to Supine Supine to sit: Supervision Sit to supine: Supervision Transfers Overall transfer level: Needs assistance Equipment used: Rolling walker (2 wheeled) Transfers: Sit to/from Stand Sit to Stand: Min assist General transfer comment: assist to bring hips up and for balance. Ambulation/Gait Ambulation/Gait assistance: Min assist Ambulation Distance (Feet): 20 Feet Assistive device: Rolling walker (2 wheeled) Gait Pattern/deviations: Step-through pattern;Decreased step  length - right;Decreased step length - left;Shuffle;Trunk flexed;Narrow base of support Gait velocity: very slow Gait velocity interpretation: Below normal speed for age/gender General Gait Details: Verbal cues to stand more erect and lengthen steps    Exercises     PT Diagnosis: Difficulty walking;Generalized weakness  PT Problem List: Decreased strength;Decreased activity tolerance;Decreased balance;Decreased mobility;Decreased knowledge of use of DME PT Treatment Interventions: DME instruction;Gait training;Functional mobility training;Therapeutic activities;Therapeutic exercise;Balance training;Patient/family education     PT Goals(Current goals can be found in the care plan section) Acute Rehab PT Goals Patient Stated Goal: Go home and be able to be getting around all of the time PT Goal Formulation: With patient Time For Goal Achievement: 05/16/13 Potential to Achieve Goals: Good  Visit Information  Last PT Received On: 05/09/13 Assistance Needed: +1 History of Present Illness: 63 y.o. male presenting with generalized weakness, fall, nausea, and dehydration. PMH is significant for larnygeal cancer s/p laryngectomy 2009, CHF HFrEF 45-50%, HTN, HLD, cirrhosis of liver, history of alcoholism, osteoarthritis.       Prior [place] expects to be discharged to:: Private residence Living Arrangements: Children Available Help at Discharge: Family;Available PRN/intermittently Type of Home: House Home Layout: Two level Alternate Level Stairs-Number of Steps: flight Home Equipment: None Prior Function Level of Independence: Independent Comments: Reports he was cooking and cleaning until 2 weeks ago. Communication Communication: Other (comment) (stoma, mouths words or writes.) Dominant Hand: Right    Cognition  Cognition Arousal/Alertness: Awake/alert Behavior During Therapy: WFL for tasks assessed/performed Overall Cognitive Status: Within  Functional Limits for tasks assessed    Extremity/Trunk Assessment Upper Extremity Assessment Upper Extremity Assessment: Defer to OT evaluation Lower Extremity  Assessment Lower Extremity Assessment: Generalized weakness   Balance Balance Overall balance assessment: Needs assistance Sitting-balance support: No upper extremity supported Sitting balance-[name]: Good Standing balance support: Bilateral upper extremity supported Standing balance-[name]: Poor Standing balance comment: Min A to stand with rolling walker.  End of Session PT - End of Session Equipment Utilized During Treatment: Gait belt Activity Tolerance: Patient limited by fatigue Patient left: in bed;with call bell/phone within reach;with bed alarm set Nurse Communication: Mobility status  GP     [name] 05/09/2013, 3:03 PM  [facility] PT [phone]

## 2013-05-10 DIAGNOSIS — J9620 Acute and chronic respiratory failure, unspecified whether with hypoxia or hypercapnia: Secondary | ICD-10-CM | POA: Diagnosis not present

## 2013-05-10 DIAGNOSIS — I959 Hypotension, unspecified: Secondary | ICD-10-CM | POA: Diagnosis not present

## 2013-05-10 DIAGNOSIS — N179 Acute kidney failure, unspecified: Secondary | ICD-10-CM

## 2013-05-10 DIAGNOSIS — I951 Orthostatic hypotension: Secondary | ICD-10-CM

## 2013-05-10 DIAGNOSIS — E860 Dehydration: Secondary | ICD-10-CM | POA: Diagnosis not present

## 2013-05-10 LAB — COMPREHENSIVE METABOLIC PANEL
ALBUMIN: 2.5 g/dL — AB (ref 3.5–5.2)
ALT: 40 U/L (ref 0–53)
AST: 62 U/L — ABNORMAL HIGH (ref 0–37)
Alkaline Phosphatase: 91 U/L (ref 39–117)
BUN: 45 mg/dL — ABNORMAL HIGH (ref 6–23)
CALCIUM: 7.9 mg/dL — AB (ref 8.4–10.5)
CO2: 21 mEq/L (ref 19–32)
Chloride: 108 mEq/L (ref 96–112)
Creatinine, Ser: 1.98 mg/dL — ABNORMAL HIGH (ref 0.50–1.35)
GFR calc Af Amer: 41 mL/min — ABNORMAL LOW (ref >90)
GFR calc non Af Amer: 35 mL/min — ABNORMAL LOW (ref >90)
Glucose, Bld: 97 mg/dL (ref 70–99)
POTASSIUM: 3.9 meq/L (ref 3.7–5.3)
SODIUM: 142 meq/L (ref 137–147)
TOTAL PROTEIN: 6.6 g/dL (ref 6.0–8.3)
Total Bilirubin: 1.2 mg/dL (ref 0.3–1.2)

## 2013-05-10 LAB — MAGNESIUM: Magnesium: 2.2 mg/dL (ref 1.5–2.5)

## 2013-05-10 LAB — CBC
HCT: 28.9 % — ABNORMAL LOW (ref 39.0–52.0)
HEMOGLOBIN: 10 g/dL — AB (ref 13.0–17.0)
MCH: 30 pg (ref 26.0–34.0)
MCHC: 34.6 g/dL (ref 30.0–36.0)
MCV: 86.8 fL (ref 78.0–100.0)
Platelets: 119 10*3/uL — ABNORMAL LOW (ref 150–400)
RBC: 3.33 MIL/uL — ABNORMAL LOW (ref 4.22–5.81)
RDW: 16.3 % — AB (ref 11.5–15.5)
WBC: 4.3 10*3/uL (ref 4.0–10.5)

## 2013-05-10 LAB — MRSA PCR SCREENING: MRSA by PCR: NEGATIVE

## 2013-05-10 LAB — T4, FREE: FREE T4: 0.89 ng/dL (ref 0.80–1.80)

## 2013-05-10 LAB — LACTIC ACID, PLASMA: Lactic Acid, Venous: 1.4 mmol/L (ref 0.5–2.2)

## 2013-05-10 LAB — T3, FREE: T3, Free: 1.7 pg/mL — ABNORMAL LOW (ref 2.3–4.2)

## 2013-05-10 LAB — PHOSPHORUS: Phosphorus: 1.9 mg/dL — ABNORMAL LOW (ref 2.3–4.6)

## 2013-05-10 MED ORDER — VANCOMYCIN HCL IN DEXTROSE 1-5 GM/200ML-% IV SOLN
1000.0000 mg | Freq: Once | INTRAVENOUS | Status: AC
  Administered 2013-05-10: 1000 mg via INTRAVENOUS
  Filled 2013-05-10 (×2): qty 200

## 2013-05-10 MED ORDER — SENNOSIDES-DOCUSATE SODIUM 8.6-50 MG PO TABS
2.0000 | ORAL_TABLET | Freq: Every day | ORAL | Status: DC
  Administered 2013-05-10 – 2013-05-12 (×3): 2 via ORAL
  Filled 2013-05-10 (×4): qty 2

## 2013-05-10 MED ORDER — PIPERACILLIN-TAZOBACTAM 3.375 G IVPB
3.3750 g | Freq: Three times a day (TID) | INTRAVENOUS | Status: DC
Start: 2013-05-10 — End: 2013-05-11
  Administered 2013-05-10 – 2013-05-11 (×4): 3.375 g via INTRAVENOUS
  Filled 2013-05-10 (×6): qty 50

## 2013-05-10 MED ORDER — SODIUM CHLORIDE 0.9 % IV BOLUS (SEPSIS)
1000.0000 mL | Freq: Once | INTRAVENOUS | Status: AC
  Administered 2013-05-10: 1000 mL via INTRAVENOUS

## 2013-05-10 MED ORDER — SODIUM CHLORIDE 0.9 % IV BOLUS (SEPSIS)
500.0000 mL | Freq: Once | INTRAVENOUS | Status: AC
  Administered 2013-05-10: 500 mL via INTRAVENOUS

## 2013-05-10 MED ORDER — VANCOMYCIN HCL 500 MG IV SOLR
500.0000 mg | INTRAVENOUS | Status: DC
  Administered 2013-05-11: 500 mg via INTRAVENOUS
  Filled 2013-05-10: qty 500

## 2013-05-10 NOTE — Progress Notes (Signed)
ANTIBIOTIC CONSULT NOTE - INITIAL  Pharmacy Consult for Vancomycin Indication: rule out sepsis  Allergies  Allergen Reactions  . Vicodin [Hydrocodone-Acetaminophen] Rash    Patient Measurements: Height: 5\' 11"  (180.3 cm) Weight: 119 lb 11.4 oz (54.3 kg) IBW/kg (Calculated) : 75.3  Vital Signs: Temp: 97.5 F (36.4 C) (03/07 1100) Temp src: Oral (03/07 1100) BP: 85/55 mmHg (03/07 1100) Pulse Rate: 65 (03/07 0813) Intake/Output from previous day: 03/06 0701 - 03/07 0700 In: 2096.3 [P.O.:110; I.V.:936.3; IV Piggyback:1050] Out: 176 [Urine:175; Stool:1] Intake/Output from this shift: Total I/O In: 300 [I.V.:300] Out: -   Labs:  Recent Labs  05/08/13 1213 05/09/13 0552 05/10/13 0320  WBC 5.8 4.6 4.3  HGB 13.7 12.7* 10.0*  PLT 165 142* 119*  CREATININE 1.75* 1.67* 1.98*   Estimated Creatinine Clearance: 30.5 ml/min (by C-G formula based on Cr of 1.98). No results found for this basename: VANCOTROUGH, VANCOPEAK, VANCORANDOM, [place], GENTPEAK, GENTRANDOM, TOBRATROUGH, TOBRAPEAK, TOBRARND, AMIKACINPEAK, AMIKACINTROU, AMIKACIN,  in the last 72 hours   Microbiology: Recent Results (from the past 720 hour(s))  CULTURE, BLOOD (ROUTINE X 2)     Status: None   Collection Time    05/08/13  3:46 PM      Result Value Ref Range Status   Specimen Description BLOOD LEFT ANTECUBITAL   Final   Special Requests BOTTLES DRAWN AEROBIC AND ANAEROBIC [facility]   Final   Culture  Setup Time     Final   Value: 05/08/2013 21:29     Performed at [facility]   Culture     Final   Value:        BLOOD CULTURE RECEIVED NO GROWTH TO DATE CULTURE WILL BE HELD FOR 5 DAYS BEFORE ISSUING A FINAL NEGATIVE REPORT     Performed at [facility]   Report Status PENDING   Incomplete  CULTURE, BLOOD (ROUTINE X 2)     Status: None   Collection Time    05/08/13  3:46 PM      Result Value Ref Range Status   Specimen Description BLOOD LEFT ANTECUBITAL   Final   Special Requests BOTTLES  DRAWN AEROBIC ONLY [place]   Final   Culture  Setup Time     Final   Value: 05/08/2013 21:29     Performed at [facility]   Culture     Final   Value:        BLOOD CULTURE RECEIVED NO GROWTH TO DATE CULTURE WILL BE HELD FOR 5 DAYS BEFORE ISSUING A FINAL NEGATIVE REPORT     Performed at [facility]   Report Status PENDING   Incomplete  URINE CULTURE     Status: None   Collection Time    05/08/13  4:20 PM      Result Value Ref Range Status   Specimen Description URINE, CLEAN CATCH   Final   Special Requests NONE   Final   Culture  Setup Time     Final   Value: 05/09/2013 01:10     Performed at [place]     Final   Value: NO GROWTH     Performed at [facility]   Culture     Final   Value: NO GROWTH     Performed at [facility]   Report Status 05/09/2013 FINAL   Final  MRSA PCR SCREENING     Status: None   Collection Time    05/10/13  3:25 AM      Result Value Ref Range Status   MRSA by PCR NEGATIVE  NEGATIVE Final   Comment:            The GeneXpert MRSA Assay (FDA     approved for NASAL specimens     only), is one component of a     comprehensive MRSA colonization     surveillance program. It is not     intended to diagnose MRSA     infection nor to guide or     monitor treatment for     MRSA infections.    Medical History: Past Medical History  Diagnosis Date  . Hypertension   . Laryngeal cancer 2011    Laryngectomy, T3N0M0  . Cirrhosis of liver   . Arthritis   . Hypothyroidism   . Hyperlipidemia   . Alcohol abuse   . CHF (congestive heart failure)     EF 45-50% 05/2012    Medications:  Scheduled:  . aspirin EC  81 mg Oral Daily  . atorvastatin  20 mg Oral Daily  . feeding supplement (ENSURE COMPLETE)  237 mL Oral BID BM  . heparin  5,000 Units Subcutaneous 3 times per day  . levothyroxine  250 mcg Oral QAC breakfast  . megestrol  400 mg Oral Daily  . ondansetron  4 mg Oral 3 times per day  .  pantoprazole  40 mg Oral QHS  . piperacillin-tazobactam (ZOSYN)  IV  3.375 g Intravenous 3 times per day  . senna-docusate  2 tablet Oral QHS   Infusions:  . sodium chloride 75 mL/hr at 05/09/13 2123   Assessment: 63 yo M presenting on 3/5 with generalized, weakness, fall, nausea and dehydration. Patient now with unresolved hypotension and hypothermia to start empiric IV abx for possible sepsis. WBC wnl, LA wnl. Patient noted to have AKI with SCr 1.67>1.98 (CrCl ~30 ml/min) likely d/t poor perfusion with hypotension.   Vanc 3/7>> Zosyn 3/7>>  3/7 BCx2>> 3/5 BCx2>>ngtd 3/5 UCx>>neg   Goal of Therapy:  Vancomycin trough level 15-20 mcg/ml  Plan:  - Start Vancomycin 1000 mg IV x 1, then continue with 500 mg IV q24h - Continue Zosyn 3.375 gm IV q8h - Monitor renal function closely - Continue to monitor temp, WBC, VT prn  Harolyn Rutherford, PharmD Clinical Pharmacist - Resident Pager: [phone] Pharmacy: [phone] 05/10/2013 12:22 PM

## 2013-05-10 NOTE — Progress Notes (Signed)
Patient seen and examined by me,discussed with Dr Bridgett Larsson and agree with her note.  Appreciate CCM consult.  Will continue to hold BP meds and administer IV fluids.   Dalbert Mayotte, MD

## 2013-05-10 NOTE — Progress Notes (Signed)
Pt does not wish to wear trach collar the rest of the night. Pt does not wear normally but was placed on by previous shift due to crusty secretions around stoma. Pt encouraged to call RT if he needs anything. RN also encouraged to call RT with any questions or concerns regarding stoma.

## 2013-05-10 NOTE — Progress Notes (Signed)
Pt still hypotensive at 72/47. Rectal temp 97.2. Pt is comfortable and resting. Asymptomatic. Pt has received a total of 3L NS bolus.  Output 175cc.  MD notified. No new orders at this time.    M.Forest Gleason, RN

## 2013-05-10 NOTE — Progress Notes (Signed)
Family Medicine Teaching Service Daily Progress Note Intern Pager: [phone]  Patient name: [name] Medical record number: [id number] Date of birth: 1950-06-25 Age: 63 y.o. Gender: male  Primary Care Provider: Howard Pouch, DO Consultants: none Code Status: Full  Pt Overview and Major Events to Date:   Assessment and Plan: [name] is a 63 y.o. male presenting with generalized weakness, fall, nausea, and dehydration. PMH is significant for larnygeal cancer s/p laryngectomy 2009, CHF HFrEF 45-50%, HTN, HLD, cirrhosis of liver, history of alcoholism, osteoarthritis.   # Hypotension: Unclear etiology.  Patient is completely asymptomatic from this.  MAP ranging from 55-65.  Minimal response to fluid boluses.  Lactic acid is normal.  Creatinine up slightly. - started on zosyn for possible infection, although no source of history - ?med non compliance at home and he tanked after getting all his home BP meds - hemoglobin has dropped to 10 today - ?dilution given all the fluids; will check hemoccult; repeat CBC in am - monitor closely in step down today  # Nausea/generalized weakness/weight loss: likely secondary to malnutrition from poor oral intake. On megace to help with appetite at home. Denies any pain. History of laryngeal cancer in 2009, concerning for relapse with nonspecific symptoms other than weight loss and decreased appetite. Though pt was T3N0M0 w/ complete resection and radiation. Vitals with tachycardia, temperature 95.38F. Initial workup in ED with normal electrolytes, BUN 53, Cr 1.75, Total protein 8.9, albumin 3.5, total bilirubin 2.1, normal CBC (may be hemoconcentrated with Hgb 13.7 above apparent baseline 11-12), BNP 157.7, UDS negative, UA nitrites positive with small leuks, EKG with prolonged QTc but unchanged from prior, CXR with no acute abnormality (COPD appearance). Weight 120lbs on admission, down from 144lbs in office visit from Sept 2014.  - IV protonix  -  continue megace, has been on remeron in the past.  - did not receive PRN zofran, schedule 4mg  zofran q8hrs - ensure TID between meals.  - pending Urine and Blood cultures  - nutrition consult  - PT/OT eval  - +/- ENT consult as pt not following up   # UA with nitrites, small leukocytes: microscopy with many squamous epi cells; likely a dirty catch. No complaints of dysuria and well appearing. Based on McGreer criteria will not treat at this time  - urine culture negative - zosyn started 3/7 due to hypotension  # CHF, HFrEF 45-50% March 2014: does not currently appear fluid overloaded, no CP and not SOB.  - holding all meds due to hypotension   # Hypertension: home meds bisoprolol, hydralazine, lisinopril. BP 110s-140s/80s-110 in ED.  - holding all meds due to hypotension  # Cirrhosis: pt still w/ adequate function given protein/albumin and bili levels w/ nml LFTs and nml INR. Last ETOH several wks ago.  - start CIWA   # Hypokalemia, mild: 3.7 on admission, 3.5 this morning, normal this morning - continue to monitor  # Hyperlipidemia:  - continue lipitor 20mg    # Hypothyroidism:  - continue home synthroid  - TSH 99.9 today, T3 and T4 are okay  # Fall: denies symptoms, neurologically intact. CT head in the ED with no acute abnormality, moderate to advanced atrophy. See above  - will continue to monitor clinically, if worsens consider repeat CT head for slow subdural bleed.   # Osteoarthritis:  - continue tramadol PRN   FEN/GI: 1/2NS at 75cc/hr, soft diet, ensure supplements  Prophylaxis: heparin  Disposition: pending clinical improvement  Subjective:  Feeling good  this morning.  No dizziness, chest pain, shortness of breath, cough, nausea, abdominal pain, diarrhea, dysuria.  Complaining of some constipation.  Eating well this morning.  Objective: Temp:  [96.3 F (35.7 C)-97.9 F (36.6 C)] 97.3 F (36.3 C) (03/07 0813) Pulse Rate:  [27-99] 65 (03/07 0813) Resp:   [12-18] 15 (03/07 0813) BP: (45-124)/(28-88) 79/48 mmHg (03/07 0813) SpO2:  [93 %-100 %] 100 % (03/07 0813) Weight:  [104 lb 8 oz (47.401 kg)-119 lb 11.4 oz (54.3 kg)] 119 lb 11.4 oz (54.3 kg) (03/07 0500) Physical Exam: General: NAD, sitting up in bed eating some breakfast (not much gone from plate), quiet (almost absent) voice at baseline HEENT: EOMI, MMM Neck: stoma pink, hypopigmented skin around stoma, no secretions.  Scab present in stoma Cardiovascular: RRR, normal heart sounds, no murmur Respiratory: CTAB, normal effort Abdomen: soft, thin, non tender Extremities: no edema or cyanosis Neuro: alert and oriented, no focal deficits  Laboratory:  Recent Labs Lab 05/08/13 1213 05/09/13 0552 05/10/13 0320  WBC 5.8 4.6 4.3  HGB 13.7 12.7* 10.0*  HCT 37.9* 36.2* 28.9*  PLT 165 142* 119*    Recent Labs Lab 05/08/13 1213 05/09/13 0552 05/10/13 0320  NA 140 137 142  K 3.7 3.5* 3.9  CL 98 99 108  CO2 20 19 21   BUN 53* 49* 45*  CREATININE 1.75* 1.67* 1.98*  CALCIUM 9.7 9.1 7.9*  PROT 8.9*  --  6.6  BILITOT 2.1*  --  1.2  ALKPHOS 94  --  91  ALT 39  --  40  AST 40*  --  62*  GLUCOSE 92 84 97   Lactic acid 1.4  Imaging/Diagnostic Tests: Ct Head Wo Contrast  05/08/2013   CLINICAL DATA:  Recent falls.  Weakness.  EXAM: CT HEAD WITHOUT CONTRAST  TECHNIQUE: Contiguous axial images were obtained from the base of the skull through the vertex without intravenous contrast.  COMPARISON:  CT 09/24/2012  FINDINGS: Moderate to advanced atrophy. Mild chronic microvascular ischemia. No acute infarct. Negative for hemorrhage or mass. Negative for skull fracture.  IMPRESSION: Moderate to advanced atrophy.  No acute abnormality.   Electronically Signed   By: Franchot Gallo M.D.   On: 05/08/2013 13:37   Dg Chest Port 1 View  05/08/2013   CLINICAL DATA:  Cough and short of breath  EXAM: PORTABLE CHEST - 1 VIEW  COMPARISON:  03/12/2013  FINDINGS: COPD.  Negative for pneumonia or heart  failure.  Lungs remain clear.  IMPRESSION: COPD.  No acute abnormality.   Electronically Signed   By: Franchot Gallo M.D.   On: 05/08/2013 12:44    Melony Overly, MD 05/10/2013, 10:03 AM PGY-3, [place] Intern pager: [phone], text pages welcome

## 2013-05-10 NOTE — Progress Notes (Signed)
Pt has received 2L NS bolus. BP 68/48 manual. MD at bedside.   M.Forest Gleason, RN

## 2013-05-10 NOTE — Progress Notes (Signed)
I have seen patient and discussed current hospitalization. I agree with the excellent care provided by the primary team of [facility] and want to thank them for their continued efforts in caring for my patient.   Ares Tegtmeyer DO 

## 2013-05-10 NOTE — Progress Notes (Addendum)
Interval note  Second liter in, pt still hypotensive to 68/48. Somewhat limited as to how much fluid can be given due to diastolic CHF. He is mentating of, alert and oriented to person, hesitates on place and time now but still appropriately interactive.   Tried to place A-Line and check more accurate pressure here after discussion with CCM attending. A line cant be placed without moving to ICU so will have CCM consult.   I appreciate their recommendations and management.   Laroy Apple, MD [place] Resident, PGY-2 05/10/2013, 2:07 AM   On admission patient had UA suspicious for UTI but denied any symptoms and hypotension at that point could be attributed to dehydration, so it was considered asymptomatic bacteruria. Clearly he is well hydrated in the face of persistent hypotension now. Will go ahead and treat with IV Zosyn to cover for UTI related sepsis. Blood cultures still negative to date from admission.   Laroy Apple, MD [place] Resident, PGY-2 05/10/2013, 2:20 AM   Discussed with Critical after their exam. Patient well perfused and looks overall surprisingly well considering his blood pressure. They encourage more aggressive IV hydration and checking lactate to look for evidence of hypoperfusion. They agree with zosyn.   Will follow along closely.   Laroy Apple, MD [place] Resident, PGY-2 05/10/2013, 2:25 AM

## 2013-05-10 NOTE — Progress Notes (Signed)
FMTS Attending Note Patient seen and examined by me this morning, events overnight (hypotension) discussed with Dr Wendi Snipes.  Patient continues to have low BP readings (SBP 70s); he reports feeling well, denies dizziness or lightheadedness, denies chest pain or dyspnea.  Has been slightly hypothermic on vital readings.  On exam he is cachectic-appearing but in no distress. Breathing comfortably.   COR regular S1S2; PULM Clear bilaterally, without wheezes or rales.  EXTS: without edema.  Lactate not elevated.   A/P: Hypotension, concern for hypotension related to sepsis (given associated hypothermia).  Possible urinary source.  Agree with Zosyn.  His serum creatinine has increased slightly as well, suggesting possible hypoperfusion.  Agree with more aggressive approach to IVF rehydration and close monitoring.  Would appreciate further thoughts of CCM team as well.  Dalbert Mayotte, MD

## 2013-05-10 NOTE — Consult Note (Signed)
Name: [name] MRN: [id number] DOB: 1950-06-01    ADMISSION DATE:  05/08/2013 CONSULTATION DATE:  3/7  REFERRING MD :  FPTS PRIMARY SERVICE: FPTS  CHIEF COMPLAINT: weak/ low bp  BRIEF PATIENT DESCRIPTION:  35 yobm admitted with h/o weakness and falling spells develop profound hypotension p admit and rx with his "outpt" meds but turned out he had not been taking them for at least 3 d pta   SIGNIFICANT EVENTS / STUDIES:   cortisol level 3/7 >>>>    CULTURES: 3/5 Urine > neg  3/7 BC x 2 >>>    ANTIBIOTICS:  Zoysn 3/7 >>>  HISTORY OF PRESENT ILLNESS:   63 Y/O Male with PMX of HTN,Laryngeal CA S/P resection,pulmonary nodule,CHF, presented to the hospital for hx of fall at home,according to patient he has been falling more often in the past few months,he denies any head injury,no fainting or LOC,no seizures, he stated he normally ambulates at home by himself with no support but has become weaker in the last few days. He also mentioned his appetite has been poor hence has not been eating or drinking well at home, he denies any other GI symptoms.Denies fever,no respiratory or cardiovascular symptoms,he endorsed weight loss over the last few months.   No obvious day to day or daytime variabilty or assoc chronic sob or cough or cp or chest tightness, subjective wheeze overt sinus or hb symptoms. No unusual exp hx or h/o childhood pna/ asthma or knowledge of premature birth.  Sleeping ok without nocturnal  or early am exacerbation  of respiratory  c/o's or need for noct saba. Also denies any obvious fluctuation of symptoms with weather or environmental changes or other aggravating or alleviating factors except as outlined above   Current Medications, Allergies, Complete Past Medical History, Past Surgical History, Family History, and Social History were reviewed in [employer] record.  ROS  The following are not active complaints unless bolded sore throat,  dysphagia, dental problems, itching, sneezing,  nasal congestion or excess/ purulent secretions, ear ache,   fever, chills, sweats, unintended wt loss, pleuritic or exertional cp, hemoptysis,  orthopnea pnd or leg swelling, presyncope, palpitations, heartburn, abdominal pain, anorexia, nausea, vomiting, diarrhea  or change in bowel or urinary habits, change in stools or urine, dysuria,hematuria,  rash, arthralgias, visual complaints, headache, numbness weakness or ataxia or problems with walking or coordination,  change in mood/affect or memory.         PAST MEDICAL HISTORY :  Past Medical History  Diagnosis Date  . Hypertension   . Laryngeal cancer 2011    Laryngectomy, T3N0M0  . Cirrhosis of liver   . Arthritis   . Hypothyroidism   . Hyperlipidemia   . Alcohol abuse   . CHF (congestive heart failure)     EF 45-50% 05/2012   Past Surgical History  Procedure Laterality Date  . Tracheal surgery    . Tracheostomy     Prior to Admission medications   Medication Sig Start Date End Date Taking? Authorizing Provider  acetaminophen (TYLENOL) 500 MG tablet Take 500 mg by mouth daily as needed (pain).   Yes Historical Provider, MD  amLODipine (NORVASC) 5 MG tablet Take 1 tablet (5 mg total) by mouth daily. 11/27/12  Yes Larey Dresser, MD  atorvastatin (LIPITOR) 20 MG tablet Take 1 tablet (20 mg total) by mouth daily. 12/06/12 12/06/13 Yes Renee A Kuneff, DO  bisoprolol (ZEBETA) 10 MG tablet Take 1 tablet (10 mg  total) by mouth daily. 12/06/12  Yes Amy D Clegg, NP  guaiFENesin (MUCINEX) 600 MG 12 hr tablet Take by mouth daily as needed (congestion).   Yes Historical Provider, MD  hydrALAZINE (APRESOLINE) 25 MG tablet Take 25 mg by mouth daily.   Yes Historical Provider, MD  isosorbide mononitrate (IMDUR) 30 MG 24 hr tablet Take 1 tablet (30 mg total) by mouth daily. 11/27/12  Yes Laurey Morale, MD  levothyroxine (SYNTHROID) 200 MCG tablet Take 1 tablet (200 mcg total) by mouth daily. 12/06/12  12/06/13 Yes Renee A Kuneff, DO  lisinopril (PRINIVIL,ZESTRIL) 20 MG tablet Take 1 tablet (20 mg total) by mouth daily. 11/25/12  Yes Aundria Rud, NP  megestrol (MEGACE) 400 MG/10ML suspension Take 10 mLs (400 mg total) by mouth daily. Dispense enough for one month 11/08/12  Yes Renee A Kuneff, DO  traMADol (ULTRAM) 50 MG tablet Take 50 mg by mouth daily as needed (pain).   Yes Historical Provider, MD   Allergies  Allergen Reactions  . Vicodin [Hydrocodone-Acetaminophen] Rash    FAMILY HISTORY:  No family history on file. SOCIAL HISTORY:  reports that he has quit smoking. His smoking use included Cigarettes. He has a 10 pack-year smoking history. He has never used smokeless tobacco. He reports that he drinks alcohol. He reports that he does not use illicit drugs.  REVIEW OF SYSTEMS:    see above       SUBJECTIVE:  nad at 45 degrees, alert with good uop  Wt Readings from Last 3 Encounters:  05/10/13 119 lb 11.4 oz (54.3 kg)  03/12/13 127 lb (57.607 kg)  02/07/13 134 lb (60.782 kg)      VITAL SIGNS: Temp:  [96.3 F (35.7 C)-97.9 F (36.6 C)] 97.3 F (36.3 C) (03/07 0813) Pulse Rate:  [27-99] 65 (03/07 0813) Resp:  [12-18] 15 (03/07 0813) BP: (45-92)/(28-56) 79/48 mmHg (03/07 0813) SpO2:  [93 %-100 %] 100 % (03/07 0813) Weight:  [104 lb 8 oz (47.401 kg)-119 lb 11.4 oz (54.3 kg)] 119 lb 11.4 oz (54.3 kg) (03/07 0500) HEMODYNAMICS:   VENTILATOR SETTINGS:   INTAKE / OUTPUT: Intake/Output     03/06 0701 - 03/07 0700 03/07 0701 - 03/08 0700   P.O. 110    I.V. (mL/kg) 936.3 (17.2)    IV Piggyback 1050    Total Intake(mL/kg) 2096.3 (38.6)    Urine (mL/kg/hr) 175 (0.1)    Stool 1 (0)    Total Output 176     Net +1920.3            PHYSICAL EXAMINATION: General:  Elderly bm > stated age with permanent trach  HEENT mild turbinate edema.  Oropharynx no thrush or excess pnd or cobblestoning.  No JVD or cervical adenopathy. Mild accessory muscle hypertrophy. Trachea midline,  nl thryroid. Chest was hyperinflated by percussion with diminished breath sounds and moderate increased exp time without wheeze. Hoover sign positive at mid inspiration. Regular rate and rhythm without murmur gallop or rub or increase P2 or edema.  Abd: no hsm, nl excursion. Ext warm without cyanosis or clubbing.    LABS:  CBC  Recent Labs Lab 05/08/13 1213 05/09/13 0552 05/10/13 0320  WBC 5.8 4.6 4.3  HGB 13.7 12.7* 10.0*  HCT 37.9* 36.2* 28.9*  PLT 165 142* 119*   Coag's  Recent Labs Lab 05/08/13 1213  INR 1.16   BMET  Recent Labs Lab 05/08/13 1213 05/09/13 0552 05/10/13 0320  NA 140 137 142  K 3.7 3.5* 3.9  CL 98 99 108  CO2 20 19 21   BUN 53* 49* 45*  CREATININE 1.75* 1.67* 1.98*  GLUCOSE 92 84 97   Electrolytes  Recent Labs Lab 05/08/13 1213 05/09/13 0552 05/10/13 0320  CALCIUM 9.7 9.1 7.9*  MG  --   --  2.2  PHOS  --   --  1.9*   Sepsis Markers  Recent Labs Lab 05/08/13 1213 05/10/13 0320  LATICACIDVEN 2.1 1.4   ABG No results found for this basename: PHART, PCO2ART, PO2ART,  in the last 168 hours Liver Enzymes  Recent Labs Lab 05/08/13 1213 05/10/13 0320  AST 40* 62*  ALT 39 40  ALKPHOS 94 91  BILITOT 2.1* 1.2  ALBUMIN 3.5 2.5*   Cardiac Enzymes  Recent Labs Lab 05/08/13 1315  TROPONINI <0.30  PROBNP 157.7*   Glucose No results found for this basename: GLUCAP,  in the last 168 hours  Imaging Ct Head Wo Contrast  05/08/2013   CLINICAL DATA:  Recent falls.  Weakness.  EXAM: CT HEAD WITHOUT CONTRAST  TECHNIQUE: Contiguous axial images were obtained from the base of the skull through the vertex without intravenous contrast.  COMPARISON:  CT 09/24/2012  FINDINGS: Moderate to advanced atrophy. Mild chronic microvascular ischemia. No acute infarct. Negative for hemorrhage or mass. Negative for skull fracture.  IMPRESSION: Moderate to advanced atrophy.  No acute abnormality.   Electronically Signed   By: Franchot Gallo M.D.   On:  05/08/2013 13:37   Dg Chest Port 1 View  05/08/2013   CLINICAL DATA:  Cough and short of breath  EXAM: PORTABLE CHEST - 1 VIEW  COMPARISON:  03/12/2013  FINDINGS: COPD.  Negative for pneumonia or heart failure.  Lungs remain clear.  IMPRESSION: COPD.  No acute abnormality.   Electronically Signed   By: Franchot Gallo M.D.   On: 05/08/2013 12:44       ASSESSMENT / PLAN:     1) Hypotension from vol depletion and antihypertensive meds which have a prolonged effect - R/O  hypocortisolism  2) Mild acute renal insuff probably due to #1 plus acei > would continue to hold indefinitely  Lab Results  Component Value Date   CREATININE 1.98* 05/10/2013   CREATININE 1.67* 05/09/2013   CREATININE 1.75* 05/08/2013   CREATININE 1.15 12/04/2012      comment He looks way to alert with good uop to consider this shock or sepsis, agree with conservative rx with Fluids and hold bp meds indefinitely.   Christinia Gully, MD Pulmonary and [place] [phone] After 5:30 PM or weekends, call [phone]

## 2013-05-11 DIAGNOSIS — E860 Dehydration: Secondary | ICD-10-CM | POA: Diagnosis not present

## 2013-05-11 DIAGNOSIS — N179 Acute kidney failure, unspecified: Secondary | ICD-10-CM | POA: Diagnosis not present

## 2013-05-11 DIAGNOSIS — I959 Hypotension, unspecified: Secondary | ICD-10-CM | POA: Diagnosis not present

## 2013-05-11 LAB — CBC
HCT: 28.1 % — ABNORMAL LOW (ref 39.0–52.0)
HEMOGLOBIN: 9.6 g/dL — AB (ref 13.0–17.0)
MCH: 29.4 pg (ref 26.0–34.0)
MCHC: 34.2 g/dL (ref 30.0–36.0)
MCV: 86.2 fL (ref 78.0–100.0)
PLATELETS: 102 10*3/uL — AB (ref 150–400)
RBC: 3.26 MIL/uL — AB (ref 4.22–5.81)
RDW: 16.6 % — ABNORMAL HIGH (ref 11.5–15.5)
WBC: 4 10*3/uL (ref 4.0–10.5)

## 2013-05-11 LAB — BASIC METABOLIC PANEL
BUN: 22 mg/dL (ref 6–23)
CO2: 18 meq/L — AB (ref 19–32)
CREATININE: 1.45 mg/dL — AB (ref 0.50–1.35)
Calcium: 7.6 mg/dL — ABNORMAL LOW (ref 8.4–10.5)
Chloride: 103 mEq/L (ref 96–112)
GFR calc Af Amer: 59 mL/min — ABNORMAL LOW (ref >90)
GFR calc non Af Amer: 51 mL/min — ABNORMAL LOW (ref >90)
GLUCOSE: 87 mg/dL (ref 70–99)
Potassium: 3.6 mEq/L — ABNORMAL LOW (ref 3.7–5.3)
Sodium: 133 mEq/L — ABNORMAL LOW (ref 137–147)

## 2013-05-11 LAB — PROCALCITONIN: Procalcitonin: 0.12 ng/mL

## 2013-05-11 LAB — CORTISOL: CORTISOL PLASMA: 3.8 ug/dL

## 2013-05-11 MED ORDER — POTASSIUM CHLORIDE CRYS ER 20 MEQ PO TBCR
20.0000 meq | EXTENDED_RELEASE_TABLET | Freq: Two times a day (BID) | ORAL | Status: AC
  Administered 2013-05-11 (×2): 20 meq via ORAL
  Filled 2013-05-11: qty 1

## 2013-05-11 NOTE — Progress Notes (Signed)
Name: [name] MRN: [id number] DOB: 1950/08/11    ADMISSION DATE:  05/08/2013 CONSULTATION DATE:  3/7  REFERRING MD :  FPTS PRIMARY SERVICE: FPTS  CHIEF COMPLAINT: weak/ low bp  BRIEF PATIENT DESCRIPTION:  34 yobm admitted with h/o weakness and falling spells develop profound hypotension p admit and rx with his "outpt" meds but turned out he had not been taking them for at least 3 d pta   SIGNIFICANT EVENTS / STUDIES:   cortisol level 3/7 >>>>    CULTURES: 3/5 Urine > neg  3/7 BC x 2 >>>    ANTIBIOTICS:  Zoysn 3/6 > 3/7        SUBJECTIVE:  nad at 45 degrees, alert with good uop  Wt Readings from Last 3 Encounters:  05/11/13 117 lb 11.6 oz (53.4 kg)  03/12/13 127 lb (57.607 kg)  02/07/13 134 lb (60.782 kg)      VITAL SIGNS: Temp:  [97.5 F (36.4 C)-98.6 F (37 C)] 98.5 F (36.9 C) (03/08 0737) Pulse Rate:  [68-101] 75 (03/08 0800) Resp:  [13-22] 22 (03/08 0800) BP: (78-146)/(59-99) 108/63 mmHg (03/08 0800) SpO2:  [94 %-100 %] 94 % (03/08 0800) FiO2 (%):  [28 %] 28 % (03/07 1627) Weight:  [117 lb 11.6 oz (53.4 kg)] 117 lb 11.6 oz (53.4 kg) (03/08 0500) HEMODYNAMICS:   VENTILATOR SETTINGS: Vent Mode:  [-]  FiO2 (%):  [28 %] 28 % INTAKE / OUTPUT: Intake/Output     03/07 0701 - 03/08 0700 03/08 0701 - 03/09 0700   P.O. 240    I.V. (mL/kg) 1650 (30.9) 75 (1.4)   IV Piggyback 350 0   Total Intake(mL/kg) 2240 (41.9) 75 (1.4)   Urine (mL/kg/hr) 1201 (0.9) 250 (1.1)   Stool     Total Output 1201 250   Net +1039 -175          Intake/Output Summary (Last 24 hours) at 05/11/13 1114 Last data filed at 05/11/13 0800  Gross per 24 hour  Intake   2015 ml  Output   1451 ml  Net    564 ml    PHYSICAL EXAMINATION: General:  Elderly bm > stated age with permanent trach  HEENT mild turbinate edema.  Oropharynx no thrush or excess pnd or cobblestoning.  No JVD or cervical adenopathy. Mild accessory muscle hypertrophy. Trachea midline, nl thryroid. Chest  was hyperinflated by percussion with diminished breath sounds and moderate increased exp time without wheeze. Hoover sign positive at mid inspiration. Regular rate and rhythm without murmur gallop or rub or increase P2 or edema.  Abd: no hsm, nl excursion. Ext warm without cyanosis or clubbing.    LABS:  CBC  Recent Labs Lab 05/09/13 0552 05/10/13 0320 05/11/13 0540  WBC 4.6 4.3 4.0  HGB 12.7* 10.0* 9.6*  HCT 36.2* 28.9* 28.1*  PLT 142* 119* 102*   Coag's  Recent Labs Lab 05/08/13 1213  INR 1.16   BMET  Recent Labs Lab 05/09/13 0552 05/10/13 0320 05/11/13 0540  NA 137 142 133*  K 3.5* 3.9 3.6*  CL 99 108 103  CO2 19 21 18*  BUN 49* 45* 22  CREATININE 1.67* 1.98* 1.45*  GLUCOSE 84 97 87   Electrolytes  Recent Labs Lab 05/09/13 0552 05/10/13 0320 05/11/13 0540  CALCIUM 9.1 7.9* 7.6*  MG  --  2.2  --   PHOS  --  1.9*  --    Sepsis Markers  Recent Labs Lab 05/08/13 1213 05/10/13 0320 05/11/13  0915  LATICACIDVEN 2.1 1.4  --   PROCALCITON  --   --  0.12   ABG No results found for this basename: PHART, PCO2ART, PO2ART,  in the last 168 hours Liver Enzymes  Recent Labs Lab 05/08/13 1213 05/10/13 0320  AST 40* 62*  ALT 39 40  ALKPHOS 94 91  BILITOT 2.1* 1.2  ALBUMIN 3.5 2.5*   Cardiac Enzymes  Recent Labs Lab 05/08/13 1315  TROPONINI <0.30  PROBNP 157.7*   Glucose No results found for this basename: GLUCAP,  in the last 168 hours  Imaging No results found.     ASSESSMENT / PLAN:     1) Hypotension from vol depletion and antihypertensive meds which have a prolonged effect - R/O  hypocortisolism  2) Mild acute renal insuff probably due to #1 plus acei > would continue to hold indefinitely  Lab Results  Component Value Date   CREATININE 1.45* 05/11/2013   CREATININE 1.98* 05/10/2013   CREATININE 1.67* 05/09/2013   CREATININE 1.15 12/04/2012       Looks great, will sign off but be sure to check cortisol when available   Christinia Gully, MD Pulmonary and [place] [phone] After 5:30 PM or weekends, call [phone]

## 2013-05-11 NOTE — Progress Notes (Signed)
Family Medicine Teaching Service Daily Progress Note Intern Pager: [phone]  Patient name: [name] Medical record number: [id number] Date of birth: [birth year] Age: 63 y.o. Gender: male  Primary Care Provider: Howard Pouch, DO Consultants: none Code Status: Full  Pt Overview and Major Events to Date:  3/7 - hypotension, so given IVF, started on broad spectrum anti-bx, and help BP meds   Assessment and Plan: [name] is a 63 y.o. male presenting with generalized weakness, fall, nausea, and dehydration. PMH is significant for larnygeal cancer s/p laryngectomy 2009, CHF HFrEF 45-50%, HTN, HLD, cirrhosis of liver, history of alcoholism, osteoarthritis.   # Hypotension: Unclear etiology.  Patient is completely asymptomatic from this.  MAP ranging from 55-65.  Minimal response to fluid boluses.  Lactic acid is normal.  Creatinine up slightly. - started on van and zosyn 3/7 for possible infection, although no source of history - ?med non compliance at home and he tanked after getting all his home BP meds - Stable over last 24 hours, with most likely due to dehydration + over medication and less likely sepsis; order procalcitonin and if normal, then stop antibiotics; otherwise cont antibiotics for 48 hours and if blood cultures negative then stop 3/9, cont to hold BP meds, keep fluid at 75 cc /hr - Transfer out of stepdown   # Anemia, Normocytic - 13.7 on admission, now 9.6 today 3/8, likely component of dilution since pt net + 3.5 L since admission and all cell lines down - cont to monitor and f/u fecal occult blood   # Nausea/generalized weakness/weight loss: likely secondary to malnutrition from poor oral intake. On megace to help with appetite at home. Denies any pain. History of laryngeal cancer in 2009, concerning for relapse with nonspecific symptoms other than weight loss and decreased appetite. Though pt was T3N0M0 w/ complete resection and radiation. Vitals with  tachycardia, temperature 95.53F. Initial workup in ED with normal electrolytes, BUN 53, Cr 1.75, Total protein 8.9, albumin 3.5, total bilirubin 2.1, normal CBC (may be hemoconcentrated with Hgb 13.7 above apparent baseline 11-12), BNP 157.7, UDS negative, UA nitrites positive with small leuks, EKG with prolonged QTc but unchanged from prior, CXR with no acute abnormality (COPD appearance). Weight 120lbs on admission, down from 144lbs in office visit from Sept 2014.  - IV protonix  - continue megace, has been on remeron in the past.  - did not receive PRN zofran, schedule 4mg  zofran q8hrs - ensure TID between meals.  - pending Urine and Blood cultures  - nutrition consult  - PT/OT eval  - +/- ENT consult as pt not following up  - CT soft tissue neck with stable clinically   # UA with nitrites, small leukocytes: microscopy with many squamous epi cells; likely a dirty catch. No complaints of dysuria and well appearing. Based on McGreer criteria will not treat at this time  - urine culture negative - zosyn started 3/7 due to hypotension  # CHF, HFrEF 45-50% March 2014: does not currently appear fluid overloaded, no CP and not SOB.  - holding all meds due to hypotension   # Hypertension: home meds bisoprolol, hydralazine, lisinopril. BP 110s-140s/80s-110 in ED.  - holding all meds due to hypotension  # Cirrhosis: pt still w/ adequate function given protein/albumin and bili levels w/ nml LFTs and nml INR. Last ETOH several wks ago.  - start CIWA   # Hypokalemia, mild: 3.7 on admission, 3.5 this morning, normal this morning - continue to monitor  #  Hyperlipidemia:  - continue lipitor 20mg    # Hypothyroidism:  - continue home synthroid  - TSH 99.9 today, T3 and T4 are okay  # Fall: denies symptoms, neurologically intact. CT head in the ED with no acute abnormality, moderate to advanced atrophy. See above  - will continue to monitor clinically, if worsens consider repeat CT head for slow  subdural bleed.   # Osteoarthritis:  - continue tramadol PRN   FEN/GI: 1/2NS at 75cc/hr, soft diet, ensure supplements  Prophylaxis: heparin  Disposition: out of step down  Subjective:  Feeling good this morning.  No dizziness, chest pain, shortness of breath, cough, nausea, abdominal pain, diarrhea, dysuria.  Complaining of some constipation.  Eating well this morning.  Objective: Temp:  [97.5 F (36.4 C)-98.6 F (37 C)] 98.5 F (36.9 C) (03/08 0737) Pulse Rate:  [68-101] 79 (03/08 0737) Resp:  [12-21] 18 (03/08 0737) BP: (76-146)/(50-99) 117/81 mmHg (03/08 0737) SpO2:  [94 %-100 %] 96 % (03/08 0737) FiO2 (%):  [28 %] 28 % (03/07 1627) Weight:  [117 lb 11.6 oz (53.4 kg)] 117 lb 11.6 oz (53.4 kg) (03/08 0500) Physical Exam: General: NAD, lying in bed quietly, quiet (almost absent) voice at baseline HEENT: EOMI, MMM Neck: stoma pink, hypopigmented skin around stoma, no secretions.  Scab present in stoma Cardiovascular: RRR, normal heart sounds, no murmur Respiratory: CTAB, normal effort Abdomen: soft, thin, non tender Extremities: no edema or cyanosis Neuro: alert and oriented, no focal deficits  Laboratory:  Recent Labs Lab 05/09/13 0552 05/10/13 0320 05/11/13 0540  WBC 4.6 4.3 4.0  HGB 12.7* 10.0* 9.6*  HCT 36.2* 28.9* 28.1*  PLT 142* 119* 102*    Recent Labs Lab 05/08/13 1213 05/09/13 0552 05/10/13 0320 05/11/13 0540  NA 140 137 142 133*  K 3.7 3.5* 3.9 3.6*  CL 98 99 108 103  CO2 20 19 21  18*  BUN 53* 49* 45* 22  CREATININE 1.75* 1.67* 1.98* 1.45*  CALCIUM 9.7 9.1 7.9* 7.6*  PROT 8.9*  --  6.6  --   BILITOT 2.1*  --  1.2  --   ALKPHOS 94  --  91  --   ALT 39  --  40  --   AST 40*  --  62*  --   GLUCOSE 92 84 97 87   Lactic acid 1.4  Imaging/Diagnostic Tests: Ct Head Wo Contrast  05/08/2013   CLINICAL DATA:  Recent falls.  Weakness.  EXAM: CT HEAD WITHOUT CONTRAST  TECHNIQUE: Contiguous axial images were obtained from the base of the skull  through the vertex without intravenous contrast.  COMPARISON:  CT 09/24/2012  FINDINGS: Moderate to advanced atrophy. Mild chronic microvascular ischemia. No acute infarct. Negative for hemorrhage or mass. Negative for skull fracture.  IMPRESSION: Moderate to advanced atrophy.  No acute abnormality.   Electronically Signed   By: Franchot Gallo M.D.   On: 05/08/2013 13:37   Dg Chest Port 1 View  05/08/2013   CLINICAL DATA:  Cough and short of breath  EXAM: PORTABLE CHEST - 1 VIEW  COMPARISON:  03/12/2013  FINDINGS: COPD.  Negative for pneumonia or heart failure.  Lungs remain clear.  IMPRESSION: COPD.  No acute abnormality.   Electronically Signed   By: Franchot Gallo M.D.   On: 05/08/2013 12:44    Angelica Ran, MD 05/11/2013, 8:38 AM PGY-3, [place] Intern pager: [phone], text pages welcome

## 2013-05-11 NOTE — Progress Notes (Signed)
FMTS Attending Note Patient seen and examined by me, discussed with resident team and I agree with Dr Thea Gist note for today.  [name] BP has remained stable, and he has remained afebrile.  He offers no complaints today.  Renal function is improving; lends credence to the idea that AKI may have been related to hypoperfusion.  Plan to transfer off SDU today; given low likelihood of sepsis as cause of hypotension, I agree with holding antibiotics and observing, watch for blood cultures.  May consider our CT investigation into the possible causes of his dramatic weight loss in setting of prior laryngeal cancer.  Dalbert Mayotte, MD

## 2013-05-12 ENCOUNTER — Encounter (HOSPITAL_COMMUNITY): Payer: Self-pay | Admitting: *Deleted

## 2013-05-12 ENCOUNTER — Inpatient Hospital Stay (HOSPITAL_COMMUNITY): Payer: Medicare Other

## 2013-05-12 DIAGNOSIS — I5021 Acute systolic (congestive) heart failure: Secondary | ICD-10-CM | POA: Diagnosis not present

## 2013-05-12 DIAGNOSIS — S3981XA Other specified injuries of abdomen, initial encounter: Secondary | ICD-10-CM | POA: Diagnosis not present

## 2013-05-12 DIAGNOSIS — D320 Benign neoplasm of cerebral meninges: Secondary | ICD-10-CM | POA: Diagnosis not present

## 2013-05-12 DIAGNOSIS — J9620 Acute and chronic respiratory failure, unspecified whether with hypoxia or hypercapnia: Secondary | ICD-10-CM | POA: Diagnosis not present

## 2013-05-12 DIAGNOSIS — N179 Acute kidney failure, unspecified: Secondary | ICD-10-CM | POA: Diagnosis not present

## 2013-05-12 DIAGNOSIS — S298XXA Other specified injuries of thorax, initial encounter: Secondary | ICD-10-CM | POA: Diagnosis not present

## 2013-05-12 DIAGNOSIS — E860 Dehydration: Secondary | ICD-10-CM | POA: Diagnosis not present

## 2013-05-12 LAB — BASIC METABOLIC PANEL
BUN: 14 mg/dL (ref 6–23)
CHLORIDE: 103 meq/L (ref 96–112)
CO2: 21 meq/L (ref 19–32)
Calcium: 8 mg/dL — ABNORMAL LOW (ref 8.4–10.5)
Creatinine, Ser: 1.22 mg/dL (ref 0.50–1.35)
GFR calc Af Amer: 73 mL/min — ABNORMAL LOW (ref >90)
GFR calc non Af Amer: 63 mL/min — ABNORMAL LOW (ref >90)
Glucose, Bld: 92 mg/dL (ref 70–99)
Potassium: 3.9 mEq/L (ref 3.7–5.3)
Sodium: 135 mEq/L — ABNORMAL LOW (ref 137–147)

## 2013-05-12 LAB — CBC
HCT: 28.1 % — ABNORMAL LOW (ref 39.0–52.0)
HEMOGLOBIN: 10 g/dL — AB (ref 13.0–17.0)
MCH: 30.2 pg (ref 26.0–34.0)
MCHC: 35.6 g/dL (ref 30.0–36.0)
MCV: 84.9 fL (ref 78.0–100.0)
Platelets: 115 10*3/uL — ABNORMAL LOW (ref 150–400)
RBC: 3.31 MIL/uL — ABNORMAL LOW (ref 4.22–5.81)
RDW: 16.2 % — AB (ref 11.5–15.5)
WBC: 4.8 10*3/uL (ref 4.0–10.5)

## 2013-05-12 LAB — MAGNESIUM: Magnesium: 1.4 mg/dL — ABNORMAL LOW (ref 1.5–2.5)

## 2013-05-12 MED ORDER — SODIUM CHLORIDE 0.9 % IV BOLUS (SEPSIS)
500.0000 mL | Freq: Once | INTRAVENOUS | Status: AC
  Administered 2013-05-12: 500 mL via INTRAVENOUS

## 2013-05-12 MED ORDER — COSYNTROPIN 0.25 MG IJ SOLR
0.2500 mg | Freq: Once | INTRAMUSCULAR | Status: AC
  Administered 2013-05-13: 0.25 mg via INTRAVENOUS
  Filled 2013-05-12: qty 0.25

## 2013-05-12 MED ORDER — MAGNESIUM SULFATE 4000MG/100ML IJ SOLN
4.0000 g | Freq: Once | INTRAMUSCULAR | Status: AC
  Administered 2013-05-12: 4 g via INTRAVENOUS
  Filled 2013-05-12: qty 100

## 2013-05-12 MED ORDER — IOHEXOL 300 MG/ML  SOLN
25.0000 mL | INTRAMUSCULAR | Status: AC
  Administered 2013-05-12 (×2): 25 mL via ORAL

## 2013-05-12 MED ORDER — IOHEXOL 300 MG/ML  SOLN
100.0000 mL | Freq: Once | INTRAMUSCULAR | Status: AC | PRN
  Administered 2013-05-12: 100 mL via INTRAVENOUS

## 2013-05-12 NOTE — Progress Notes (Signed)
Stoma cleansed at this time  And appears to be WNL. No complications noted. RT will monitor.

## 2013-05-12 NOTE — Progress Notes (Signed)
Discussed with fm practice teaching service resident re: cortisol 3.8 while hypotensive > defer w/u  to primary service but clearly this is abn adrenal response ? Etiology ?   pccm available prn  Christinia Gully, MD Pulmonary and [place] [phone] After 5:30 PM or weekends, call [phone]

## 2013-05-12 NOTE — Progress Notes (Signed)
Occupational Therapy Treatment Patient Details Name: [name] MRN: [id number] DOB: 1950/10/31 Today's Date: 05/12/2013 Time: 3016-0109 OT Time Calculation (min): 22 min  OT Assessment / Plan / Recommendation  History of present illness 63 y.o. male presenting with generalized weakness, fall, nausea, and dehydration. PMH is significant for larnygeal cancer s/p laryngectomy 2009, CHF HFrEF 45-50%, HTN, HLD, cirrhosis of liver, history of alcoholism, osteoarthritis.   OT comments  This 63 yo making progress and reports that he will have someone with him 24/7 at home. Says he still feels weak, but better than he was.  Follow Up Recommendations  Home health OT       Equipment Recommendations  None recommended by OT       Frequency Min 2X/week   Progress towards OT Goals Progress towards OT goals: Progressing toward goals  Plan Discharge plan needs to be updated    Precautions / Restrictions Precautions Precautions: Fall Restrictions Weight Bearing Restrictions: No       ADL  Lower Body Dressing: Min guard Where Assessed - Lower Body Dressing: Unsupported sit to stand Toilet Transfer: Min Psychiatric nurse Method: Sit to Loss adjuster, chartered: Regular height toilet;Grab bars Equipment Used: Rolling walker Transfers/Ambulation Related to ADLs: min guard A for all with RW      OT Goals(current goals can now be found in the care plan section)    Visit Information  Last OT Received On: 05/12/13 Assistance Needed: +1 History of Present Illness: 63 y.o. male presenting with generalized weakness, fall, nausea, and dehydration. PMH is significant for larnygeal cancer s/p laryngectomy 2009, CHF HFrEF 45-50%, HTN, HLD, cirrhosis of liver, history of alcoholism, osteoarthritis.          Cognition  Cognition Arousal/Alertness: Awake/alert Behavior During Therapy: WFL for tasks assessed/performed Overall Cognitive Status: Within Functional Limits for tasks  assessed    Mobility  Bed Mobility Overal bed mobility: Modified Independent Bed Mobility: Supine to Sit;Sit to Supine Supine to sit: Modified independent (Device/Increase time);HOB elevated Sit to supine: Modified independent (Device/Increase time);HOB elevated General bed mobility comments: Supervision for safety only.  Patient using bedrail to move to sitting. Transfers Overall transfer level: Needs assistance Equipment used: Rolling walker (2 wheeled) Transfers: Sit to/from Stand Sit to Stand: Min guard General transfer comment: increased time and rocking momentum to get up       Balance Balance Overall balance assessment: Needs assistance Sitting-balance support: No upper extremity supported;Feet supported Sitting balance-[name]: Good Standing balance support: Single extremity supported;During functional activity (toliet transfers) Standing balance-[name]: Fair  End of Session OT - End of Session Equipment Utilized During Treatment: Rolling walker Activity Tolerance: Patient tolerated treatment well Patient left: in bed;with call bell/phone within reach    [name] [phone] 05/12/2013, 2:14 PM

## 2013-05-12 NOTE — Progress Notes (Addendum)
FMTS Attending Daily Note:  Nathaniel Sabal MD  [phone] pager  Family Practice pager:  [phone] I have seen and examined this patient and have reviewed their chart. I have discussed this patient with the resident. I agree with the resident's findings, assessment and care plan.  Additionally:  - for CT scan neck, chest, abdomen/pelvis today. - will obtain AM cortisol tomorrow for adrenal insufficiency.   - AKI and hypotension resolving.   - DC Foley.  Saline lock IVF.    Alveda Reasons, MD 05/12/2013 1:54 PM

## 2013-05-12 NOTE — Progress Notes (Signed)
Family Medicine Teaching Service Daily Progress Note Intern Pager: [phone]  Patient name: [name] Medical record number: [id number] Date of birth: 25-Sep-1950 Age: 63 y.o. Gender: male  Primary Care Provider: Howard Pouch, DO Consultants: none Code Status: Full  Pt Overview and Major Events to Date:  3/7 - hypotension, so given IVF, started on broad spectrum anti-bx, and help BP meds 3/8 - improved BP, out of SDU  Assessment and Plan: [name] is a 63 y.o. male presenting with generalized weakness, fall, nausea, and dehydration. PMH is significant for larnygeal cancer s/p laryngectomy 2009, CHF HFrEF 45-50%, HTN, HLD, cirrhosis of liver, history of alcoholism, osteoarthritis.   # Hypotension: Unclear etiology.  Patient is completely asymptomatic from this.  MAP ranging from 55-65.  Minimal response to fluid boluses.  Lactic acid is normal.  Creatinine up slightly. Suspected from dehydration and over medication (question of medicine non-compliance at home). Procalcitonin normal.  - started on van and zosyn 3/7 for possible infection, d/c'd 3/8. - blood cx NGTD - AM cortisol pending, 4pm cortisol on 3/7 was 3.8 in the setting of hypotension brings up suspicion of adrenal insufficiency  # Nausea/generalized weakness/weight loss: likely secondary to malnutrition from poor oral intake. On megace to help with appetite at home. Denies any pain. History of laryngeal cancer in 2009, concerning for relapse with nonspecific symptoms other than weight loss and decreased appetite. Though pt was T3N0M0 w/ complete resection and radiation. Vitals with tachycardia, temperature 95.28F. Initial workup in ED with normal electrolytes, BUN 53, Cr 1.75, Total protein 8.9, albumin 3.5, total bilirubin 2.1, normal CBC (may be hemoconcentrated with Hgb 13.7 above apparent baseline 11-12), BNP 157.7, UDS negative, UA nitrites positive with small leuks, EKG with prolonged QTc but unchanged from prior, CXR  with no acute abnormality (COPD appearance). Weight 120lbs on admission, down from 144lbs in office visit from Sept 2014; multiple weight measurements around 103-105 but unclear if these are accurate.  - IV protonix  - continue megace, has been on remeron in the past.  - did not receive PRN zofran, schedule 4mg  zofran q8hrs - ensure TID between meals.  - Urine cx negative, and Blood culture NGTD - nutrition consult   - PT/OT eval: SNF, however patient does not want to go; he says he has 24hr supervision at home with the help of his daughter and granddaughter. - Called ENT at [facility], f/u scheduled for 3/17, no urgent need for ENT consult in hospital - CT soft tissue neck with stable clinically  # Hypothyroidism: home dose 287mcg synthroid daily increased while in hospital - synthroid 249mcg daily  - TSH 99.9, free T3 1.7 (slightly low) and free T4 0.89 (normal)  # Concern for adrenal insufficiency: cortisol level 3.8 in setting of hypotension, pending AM cortisol level. In setting of hypothyroidism would also suspect autoimmune etiology. Currently does not appear to be in crisis with stable BPs. - low dose ACTH test tomorrow morning - if acutely worsens consider steroids  # Anemia, Normocytic - 13.7 on admission, dropped to 9.6 on 3/8, up to 10.0 on 3/9; likely component of dilution since pt net + 3.5 L since admission and all cell lines down - cont to monitor and f/u fecal occult blood   # UA with nitrites, small leukocytes: microscopy with many squamous epi cells; likely a dirty catch. No complaints of dysuria and well appearing. Based on McGreer criteria will not treat at this time  - urine culture negative - zosyn started  3/7 due to hypotension  # CHF, HFrEF 45-50% March 2014: does not currently appear fluid overloaded, no CP and not SOB.  - holding all meds due to hypotension   # Hypertension: home meds bisoprolol, hydralazine, lisinopril. BP 110s-140s/80s-110 in ED.  - holding all meds  due to hypotension  # Cirrhosis: pt still w/ adequate function given protein/albumin and bili levels w/ nml LFTs and nml INR. Last ETOH several wks ago.  - start CIWA   # Hypokalemia, mild: 3.7 on admission, down to 3.5 but 3.9 this morning. - continue to monitor  # Hyperlipidemia:  - continue lipitor 20mg    # Fall: denies symptoms, neurologically intact. CT head in the ED with no acute abnormality, moderate to advanced atrophy. See above  - will continue to monitor clinically, if worsens consider repeat CT head for slow subdural bleed.   # Osteoarthritis:  - continue tramadol PRN   FEN/GI: 1/2NS at 75cc/hr, soft diet, ensure supplements  Prophylaxis: heparin  Disposition: pending clinical improvement  Subjective:  Feels okay this morning. Denies any pain, fevers/chills. He does endorse some lightheadedness when standing. His nausea is improved, but he still doesn't want to eat that much (only a few bites gone from breakfast).  Objective: Temp:  [97.6 F (36.4 C)-98.8 F (37.1 C)] 98.8 F (37.1 C) (03/09 0539) Pulse Rate:  [75-85] 85 (03/09 0539) Resp:  [18-24] 18 (03/09 0539) BP: (98-147)/(63-97) 136/97 mmHg (03/09 0539) SpO2:  [94 %-98 %] 98 % (03/09 0539) Physical Exam: General: NAD, sitting up in bed eating breakfast, quiet (almost absent) voice at baseline HEENT: EOMI, MMM Neck: stoma pink, hypopigmented skin around stoma, some secretions audible on forced expiration Cardiovascular: RRR, normal heart sounds, no murmur Respiratory: CTAB, normal effort Abdomen: soft, thin, non-tender, non-distended Extremities: no edema or cyanosis, WWP. Neuro: alert and oriented, no focal deficits  Laboratory:  Recent Labs Lab 05/10/13 0320 05/11/13 0540 05/12/13 0540  WBC 4.3 4.0 4.8  HGB 10.0* 9.6* 10.0*  HCT 28.9* 28.1* 28.1*  PLT 119* 102* 115*    Recent Labs Lab 05/08/13 1213  05/10/13 0320 05/11/13 0540 05/12/13 0540  NA 140  < > 142 133* 135*  K 3.7  < > 3.9  3.6* 3.9  CL 98  < > 108 103 103  CO2 20  < > 21 18* 21  BUN 53*  < > 45* 22 14  CREATININE 1.75*  < > 1.98* 1.45* 1.22  CALCIUM 9.7  < > 7.9* 7.6* 8.0*  PROT 8.9*  --  6.6  --   --   BILITOT 2.1*  --  1.2  --   --   ALKPHOS 94  --  91  --   --   ALT 39  --  40  --   --   AST 40*  --  62*  --   --   GLUCOSE 92  < > 97 87 92  < > = values in this interval not displayed. Lactic acid 1.4  Imaging/Diagnostic Tests: Ct Head Wo Contrast  05/08/2013   CLINICAL DATA:  Recent falls.  Weakness.  EXAM: CT HEAD WITHOUT CONTRAST  TECHNIQUE: Contiguous axial images were obtained from the base of the skull through the vertex without intravenous contrast.  COMPARISON:  CT 09/24/2012  FINDINGS: Moderate to advanced atrophy. Mild chronic microvascular ischemia. No acute infarct. Negative for hemorrhage or mass. Negative for skull fracture.  IMPRESSION: Moderate to advanced atrophy.  No acute abnormality.   Electronically Signed  By: Marlan Palauharles  Clark M.D.   On: 05/08/2013 13:37   Dg Chest Port 1 View  05/08/2013   CLINICAL DATA:  Cough and short of breath  EXAM: PORTABLE CHEST - 1 VIEW  COMPARISON:  03/12/2013  FINDINGS: COPD.  Negative for pneumonia or heart failure.  Lungs remain clear.  IMPRESSION: COPD.  No acute abnormality.   Electronically Signed   By: Marlan Palauharles  Clark M.D.   On: 05/08/2013 12:44    Tawni CarnesAndrew Castor Gittleman, MD 05/12/2013, 7:11 AM PGY-1, [facility] FPTS Intern pager: [phone], text pages welcome

## 2013-05-12 NOTE — Care Management Note (Signed)
CARE MANAGEMENT NOTE 05/12/2013  Patient:  OSAMA, COLESON   Account Number:  [id number]  Date Initiated:  05/12/2013  Documentation initiated by:  [facility]  Subjective/Objective Assessment:   admitted with dehydration, transfer to SDU secondary to hypotension     Action/Plan:   PT/OT evals-recommended SNF, patient states has 24hr assist at home   Anticipated DC Date:  05/13/2013   Anticipated DC Plan:  [place]  In-house referral  Clinical Social Worker      [place]  CM consult      Choice offered to / List presented to:          [facility] arranged  [place].   Status of service:  Completed, signed off Medicare Important Message given?   (If response is "NO", the following Medicare IM given date fields will be blank) Date Medicare IM given:   Date Additional Medicare IM given:    Discharge Disposition:  [place]  Per UR Regulation:    If discussed at Long Length of Stay Meetings, dates discussed:    Comments:  05/12/13 11:02am Ricki Miller, RN BSN Case Manager [phone] Case Manager spoke with patient concerning need for home health at discharge. Choice offered. Referral called to Pearletha [facility] with [facility].

## 2013-05-12 NOTE — Progress Notes (Signed)
Physical Therapy Treatment Patient Details Name: [name] MRN: [id number] DOB: 1950-04-08 Today's Date: 05/12/2013 Time: 6789-3810 PT Time Calculation (min): 23 min  PT Assessment / Plan / Recommendation  History of Present Illness 63 y.o. male presenting with generalized weakness, fall, nausea, and dehydration. PMH is significant for larnygeal cancer s/p laryngectomy 2009, CHF HFrEF 45-50%, HTN, HLD, cirrhosis of liver, history of alcoholism, osteoarthritis.   PT Comments   Patient making slow gains with mobility.  Agree with need for SNF at discharge unless family can provide 24 hour assist.  Follow Up Recommendations  SNF;Supervision/Assistance - 24 hour (Unless family able to provide 24 hour assistance)     Does the patient have the potential to tolerate intense rehabilitation     Barriers to Discharge        Equipment Recommendations  Rolling walker with 5" wheels    Recommendations for Other Services    Frequency Min 3X/week   Progress towards PT Goals Progress towards PT goals: Progressing toward goals  Plan Current plan remains appropriate    Precautions / Restrictions Precautions Precautions: Fall Restrictions Weight Bearing Restrictions: No   Pertinent Vitals/Pain     Mobility  Bed Mobility Overal bed mobility: Needs Assistance Bed Mobility: Supine to Sit;Sit to Supine Supine to sit: Supervision Sit to supine: Supervision General bed mobility comments: Supervision for safety only.  Patient using bedrail to move to sitting. Transfers Overall transfer level: Needs assistance Equipment used: Rolling walker (2 wheeled) Transfers: Sit to/from Stand Sit to Stand: Min assist General transfer comment: assist to bring hips up and for balance. Ambulation/Gait Ambulation/Gait assistance: Min assist Ambulation Distance (Feet): 32 Feet Assistive device: Rolling walker (2 wheeled) Gait Pattern/deviations: Step-through pattern;Decreased stride length;Shuffle;Trunk  flexed Gait velocity: very slow Gait velocity interpretation: Below normal speed for age/gender General Gait Details: Verbal cues to stand more erect and lengthen steps    Exercises General Exercises - Lower Extremity Ankle Circles/Pumps: AROM;Both;10 reps;Seated Long Arc Quad: AROM;Both;10 reps;Seated Hip ABduction/ADduction: AROM;Both;10 reps;Seated Hip Flexion/Marching: AROM;Both;10 reps;Seated Toe Raises: AROM;Both;10 reps;Seated Heel Raises: AROM;Both;10 reps;Seated   PT Diagnosis:    PT Problem List:   PT Treatment Interventions:     PT Goals (current goals can now be found in the care plan section)    Visit Information  Last PT Received On: 05/12/13 Assistance Needed: +1 History of Present Illness: 63 y.o. male presenting with generalized weakness, fall, nausea, and dehydration. PMH is significant for larnygeal cancer s/p laryngectomy 2009, CHF HFrEF 45-50%, HTN, HLD, cirrhosis of liver, history of alcoholism, osteoarthritis.    Subjective Data  Subjective: States daughter will be with patient 24/7.  Not verified with daughter.   Cognition  Cognition Arousal/Alertness: Awake/alert Behavior During Therapy: WFL for tasks assessed/performed Overall Cognitive Status: Within Functional Limits for tasks assessed    Balance  Balance Overall balance assessment: Needs assistance Sitting-balance support: No upper extremity supported;Feet supported Sitting balance-[name]: Good Standing balance support: Bilateral upper extremity supported Standing balance-[name]: Poor  End of Session PT - End of Session Equipment Utilized During Treatment: Gait belt Activity Tolerance: Patient limited by fatigue Patient left: in bed;with call bell/phone within reach;with bed alarm set Nurse Communication: Mobility status   GP     Despina Pole 05/12/2013, 1:39 PM Carita Pian. Sanjuana Kava, [place] Pager [phone]

## 2013-05-13 LAB — CBC
HCT: 28.1 % — ABNORMAL LOW (ref 39.0–52.0)
HEMOGLOBIN: 10.1 g/dL — AB (ref 13.0–17.0)
MCH: 29.9 pg (ref 26.0–34.0)
MCHC: 35.9 g/dL (ref 30.0–36.0)
MCV: 83.1 fL (ref 78.0–100.0)
PLATELETS: 165 10*3/uL (ref 150–400)
RBC: 3.38 MIL/uL — AB (ref 4.22–5.81)
RDW: 16.1 % — ABNORMAL HIGH (ref 11.5–15.5)
WBC: 5.1 10*3/uL (ref 4.0–10.5)

## 2013-05-13 LAB — BASIC METABOLIC PANEL
BUN: 7 mg/dL (ref 6–23)
CHLORIDE: 99 meq/L (ref 96–112)
CO2: 20 mEq/L (ref 19–32)
Calcium: 8.1 mg/dL — ABNORMAL LOW (ref 8.4–10.5)
Creatinine, Ser: 1.01 mg/dL (ref 0.50–1.35)
GFR calc non Af Amer: 79 mL/min — ABNORMAL LOW (ref >90)
Glucose, Bld: 91 mg/dL (ref 70–99)
Potassium: 4.1 mEq/L (ref 3.7–5.3)
SODIUM: 132 meq/L — AB (ref 137–147)

## 2013-05-13 LAB — CORTISOL-AM, BLOOD: Cortisol - AM: 2.3 ug/dL — ABNORMAL LOW (ref 4.3–22.4)

## 2013-05-13 LAB — ACTH STIMULATION, 3 TIME POINTS
Cortisol, 30 Min: 16.1 ug/dL — ABNORMAL LOW (ref >20.0)
Cortisol, 60 Min: 18.5 ug/dL — ABNORMAL LOW (ref >20)
Cortisol, Base: 2.5 ug/dL

## 2013-05-13 LAB — PHOSPHORUS: Phosphorus: 1.8 mg/dL — ABNORMAL LOW (ref 2.3–4.6)

## 2013-05-13 LAB — OCCULT BLOOD X 1 CARD TO LAB, STOOL: Fecal Occult Bld: NEGATIVE

## 2013-05-13 MED ORDER — PANCRELIPASE (LIP-PROT-AMYL) 12000-38000 UNITS PO CPEP
1.0000 | ORAL_CAPSULE | Freq: Three times a day (TID) | ORAL | Status: DC
  Administered 2013-05-13 – 2013-05-14 (×3): 1 via ORAL
  Filled 2013-05-13 (×7): qty 1

## 2013-05-13 MED ORDER — K PHOS MONO-SOD PHOS DI & MONO 155-852-130 MG PO TABS: 250.0000 mg | ORAL_TABLET | Freq: Three times a day (TID) | ORAL | Status: DC

## 2013-05-13 MED ORDER — K PHOS MONO-SOD PHOS DI & MONO 155-852-130 MG PO TABS
500.0000 mg | ORAL_TABLET | Freq: Three times a day (TID) | ORAL | Status: AC
  Administered 2013-05-13 (×3): 500 mg via ORAL
  Filled 2013-05-13 (×3): qty 2

## 2013-05-13 NOTE — Progress Notes (Signed)
Physical Therapy Treatment Patient Details Name: [name] MRN: [id number] DOB: 17-Jun-1950 Today's Date: 05/13/2013 Time: 0109-3235 PT Time Calculation (min): 21 min  PT Assessment / Plan / Recommendation  History of Present Illness 63 y.o. male presenting with generalized weakness, fall, nausea, and dehydration. PMH is significant for larnygeal cancer s/p laryngectomy 2009, CHF HFrEF 45-50%, HTN, HLD, cirrhosis of liver, history of alcoholism, osteoarthritis.   PT Comments   Pt progressing towards physical therapy goals. Continues to move slowly and demonstrate some balance deficits with mobility.  Follow Up Recommendations  SNF (Unless family able to provide 24 hour assist)     Does the patient have the potential to tolerate intense rehabilitation     Barriers to Discharge        Equipment Recommendations  Rolling walker with 5" wheels    Recommendations for Other Services    Frequency Min 3X/week   Progress towards PT Goals Progress towards PT goals: Progressing toward goals  Plan Current plan remains appropriate    Precautions / Restrictions Precautions Precautions: Fall Restrictions Weight Bearing Restrictions: No   Pertinent Vitals/Pain Pt does not report pain throughout session, however states he is fatigued during therapeutic exercise.     Mobility  Bed Mobility Overal bed mobility: Modified Independent Bed Mobility: Supine to Sit General bed mobility comments: Pt using bed rails for support, however required no assist to transition to EOB.  Transfers Overall transfer level: Needs assistance Equipment used: Rolling walker (2 wheeled) Transfers: Sit to/from Stand Sit to Stand: Min guard General transfer comment: Increased time required to achieve full stand, however no physical assist required.  Ambulation/Gait Ambulation/Gait assistance: Min guard Ambulation Distance (Feet): 125 Feet Assistive device: Rolling walker (2 wheeled) Gait Pattern/deviations:  Step-through pattern;Decreased stride length Gait velocity: very slow Gait velocity interpretation: Below normal speed for age/gender General Gait Details: VC's for improved posture, and for walker positioning close to pt's body.     Exercises General Exercises - Lower Extremity Long Arc Quad: 10 reps;Both Straight Leg Raises: 10 reps;Both;Standing Hip Flexion/Marching: 10 reps;Both;Standing Toe Raises: 10 reps;Both;Standing   PT Diagnosis:    PT Problem List:   PT Treatment Interventions:     PT Goals (current goals can now be found in the care plan section) Acute Rehab PT Goals Patient Stated Goal: None stated PT Goal Formulation: With patient Time For Goal Achievement: 05/16/13 Potential to Achieve Goals: Good  Visit Information  Last PT Received On: 05/13/13 Assistance Needed: +1 History of Present Illness: 63 y.o. male presenting with generalized weakness, fall, nausea, and dehydration. PMH is significant for larnygeal cancer s/p laryngectomy 2009, CHF HFrEF 45-50%, HTN, HLD, cirrhosis of liver, history of alcoholism, osteoarthritis.    Subjective Data  Subjective: Pt agreeable to OOB Patient Stated Goal: None stated   Cognition  Cognition Arousal/Alertness: Awake/alert Behavior During Therapy: WFL for tasks assessed/performed Overall Cognitive Status: Within Functional Limits for tasks assessed    Balance  Balance Overall balance assessment: Needs assistance Sitting-balance support: Feet supported Sitting balance-[name]: Good Standing balance support: Bilateral upper extremity supported Standing balance-[name]: Fair  End of Session PT - End of Session Equipment Utilized During Treatment: Gait belt Activity Tolerance: Patient limited by fatigue Patient left: in chair;with call bell/phone within reach Nurse Communication: Mobility status   GP     Jolyn Lent 05/13/2013, 12:23 PM  Jolyn Lent, PT, DPT Acute Rehabilitation Services Pager:  [phone]

## 2013-05-13 NOTE — Progress Notes (Signed)
Clinical Social Work Department CLINICAL SOCIAL WORK PLACEMENT NOTE 05/13/2013  Patient:  [name], [name]  Account Number:  [id number] Admit date:  05/08/2013  Clinical Social Worker:  Berton Mount, Latanya Presser  Date/time:  05/13/2013 04:00 PM  Clinical Social Work is seeking post-discharge placement for this patient at the following level of care:   SKILLED NURSING   (*CSW will update this form in Epic as items are completed)   05/13/2013  Patient/family provided with [place] Department of Clinical Social Work's list of facilities offering this level of care within the geographic area requested by the patient (or if unable, by the patient's family).  05/13/2013  Patient/family informed of their freedom to choose among providers that offer the needed level of care, that participate in Medicare, Medicaid or managed care program needed by the patient, have an available bed and are willing to accept the patient.  05/13/2013  Patient/family informed of [facility]' ownership interest in [facility], as well as of the fact that they are under no obligation to receive care at this facility.  PASARR submitted to EDS on 05/13/2013 PASARR number received from EDS on 05/13/2013  FL2 transmitted to all facilities in geographic area requested by pt/family on  05/13/2013 FL2 transmitted to all facilities within larger geographic area on   Patient informed that his/her managed care company has contracts with or will negotiate with  certain facilities, including the following:     Patient/family informed of bed offers received:   Patient chooses bed at  Physician recommends and patient chooses bed at    Patient to be transferred to  on   Patient to be transferred to facility by   The following physician request were entered in Epic:   Additional CommentsBerton Mount, [place]

## 2013-05-13 NOTE — Progress Notes (Signed)
Clinical Social Work Department   BRIEF PSYCHOSOCIAL ASSESSMENT 05/13/2013  Patient:  RAYVION, STUMPH     Account Number:  [id number]     Admit date:  05/08/2013  Clinical Social Worker:  Adair Laundry  Date/Time:  05/13/2013 03:00 PM  Referred by:  Physician  Date Referred:  05/13/2013 Referred for  SNF Placement   Other Referral:   Interview type:  Patient Other interview type:    PSYCHOSOCIAL DATA Living Status:  WITH ADULT CHILDREN Admitted from facility:   Level of care:   Primary support name:  [name] [phone] Primary support relationship to patient:  CHILD, ADULT Degree of support available:   Pt has supportive children    CURRENT CONCERNS Current Concerns  Post-Acute Placement   Other Concerns:    SOCIAL WORK ASSESSMENT / PLAN CSW made aware that family may not be able to provide needed supervision/assistance at home and pt may need SNF. CSW visited pt room. Pt was awake and laying in bed during assessment. Pt only able to speak very little but confirmed he was comfortable completing assessment with CSW. CSW explained CSW role and SNF referral process. Pt was hesitant to SNF but agreeable to CSW beginning SNF referal process. Pt did ask for CSW to contact his daughter [name]. Pt would like to be kept informed but did say he would agree to what [name] was okay with. CSW called pt daughter and left voicemail   Assessment/plan status:  Psychosocial Support/Ongoing Assessment of Needs Other assessment/ plan:   Information/referral to community resources:   SNF list to be provided with bed offers    [name]/FAMILY'S RESPONSE TO PLAN OF CARE: Pt is agreeable but hesitant towards SNF. CSW unable to speak with family at this time       Berton Mount, [place]

## 2013-05-13 NOTE — Progress Notes (Signed)
Family Medicine Teaching Service Daily Progress Note Intern Pager: [phone]  Patient name: [name] Medical record number: [id number] Date of birth: 05/18/1950 Age: 63 y.o. Gender: male  Primary Care Provider: Howard Pouch, DO Consultants: none Code Status: Full  Pt Overview and Major Events to Date:  3/7 - hypotension, so given IVF, started on broad spectrum anti-bx, and help BP meds 3/8 - improved BP, out of SDU 3/9 - pan CT negative for obvious malignancy, chronic pancreatitis  Assessment and Plan: [name] is a 63 y.o. male presenting with generalized weakness, fall, nausea, and dehydration. PMH is significant for larnygeal cancer s/p laryngectomy 2009, CHF HFrEF 45-50%, HTN, HLD, cirrhosis of liver, history of alcoholism, osteoarthritis.   # Nausea/generalized weakness/weight loss: likely secondary to malnutrition from poor oral intake. Pan CT yesterday with no obvious malignancy, did show chronic pancreatitis, findings suggestive of chronic bladder outlet obstruction - oral protonix  - continue megace, has been on remeron in the past.  - zofran 4mg  TID scheduled - ensure TID between meals.  - Called ENT at [facility], f/u scheduled for 3/17, no urgent need for ENT consult in hospital - GI consult for outpatient follow up  # Chronic pancreatitis: called GI, agreed with following him as outpatient but no urgent need for inpatient consult - creon added 1 capsule with meals TID  # Hypothyroidism: home dose 24mcg synthroid daily increased while in hospital - synthroid 292mcg daily  - TSH 99.9, free T3 1.7 (slightly low) and free T4 0.89 (normal)  # Suspected adrenal insufficiency: AM cortisol yesterday low at 2.3. Pending standard dose ACTH stim test this morning. This is potentially secondary to longstanding megace use (megestrol has some glucocorticoid activity). Has some continued hyponatremia to 132 this morning, which could also be explained by this.  # Concern for  refeeding syndrome: phos low on 3/7 to 1.9, repeat this morning 1.8. Potassium in normal range - oral repletion with 500mg  k-phos x 3 doses (49mmol)  # Anemia, Normocytic - stable hemoglobin at 10.1 - cont to monitor  # CHF, HFrEF 45-50% March 2014: does not currently appear fluid overloaded, no CP and not SOB.  - holding all meds due to hypotension   # Hypertension: home meds bisoprolol, hydralazine, lisinopril. BP 110s-140s/80s-110 in ED.  - holding all meds due to hypotension  # Hypokalemia, resolved: 3.7 on admission, down to 3.5; stable past 2 days (4.1 this morning) - continue to monitor  # Hyperlipidemia:  - continue lipitor 20mg    # Osteoarthritis:  - continue tramadol PRN   FEN/GI: saline lock, soft diet, ensure supplements  Prophylaxis: heparin  Disposition: pending clinical improvement  Subjective:  Feels better this morning. Denies nausea, lightheadedness. Ate all 3 meals yesterday including 2 ensure.   Objective: Temp:  [98.4 F (36.9 C)-99.2 F (37.3 C)] 98.6 F (37 C) (03/10 0540) Pulse Rate:  [82-95] 82 (03/10 0856) Resp:  [16-20] 16 (03/10 0856) BP: (124-154)/(82-95) 124/82 mmHg (03/10 0540) SpO2:  [96 %-99 %] 96 % (03/10 0856) Weight:  [118 lb 13.3 oz (53.9 kg)] 118 lb 13.3 oz (53.9 kg) (03/10 0500) Physical Exam: General: NAD HEENT: EOMI, MMM Neck: stoma pink/clean/dry, no secretions Cardiovascular: RRR, normal heart sounds, no murmur Respiratory: CTAB, normal effort Abdomen: soft, thin, non-tender, non-distended, normal bowel sounds Extremities: no edema or cyanosis, WWP. Neuro: alert and oriented, no focal deficits  Laboratory:  Recent Labs Lab 05/11/13 0540 05/12/13 0540 05/13/13 0645  WBC 4.0 4.8 5.1  HGB 9.6* 10.0*  10.1*  HCT 28.1* 28.1* 28.1*  PLT 102* 115* 165    Recent Labs Lab 05/08/13 1213  05/10/13 0320 05/11/13 0540 05/12/13 0540 05/13/13 0645  NA 140  < > 142 133* 135* 132*  K 3.7  < > 3.9 3.6* 3.9 4.1  CL 98  < >  108 103 103 99  CO2 20  < > 21 18* 21 20  BUN 53*  < > 45* 22 14 7   CREATININE 1.75*  < > 1.98* 1.45* 1.22 1.01  CALCIUM 9.7  < > 7.9* 7.6* 8.0* 8.1*  PROT 8.9*  --  6.6  --   --   --   BILITOT 2.1*  --  1.2  --   --   --   ALKPHOS 94  --  91  --   --   --   ALT 39  --  40  --   --   --   AST 40*  --  62*  --   --   --   GLUCOSE 92  < > 97 87 92 91  < > = values in this interval not displayed. Lactic acid 1.4  Imaging/Diagnostic Tests: Ct Soft Tissue Neck W Contrast  05/12/2013   CLINICAL DATA:  History of laryngeal carcinoma and laryngectomy. Weight loss and generalized weakness  EXAM: CT NECK WITH CONTRAST  TECHNIQUE: Multidetector CT imaging of the neck was performed using the standard protocol following the bolus administration of intravenous contrast.  CONTRAST:  165mL OMNIPAQUE IOHEXOL 300 MG/ML  SOLN  COMPARISON:  CT neck 05/19/2010  FINDINGS: Post op changes of laryngectomy and tracheostomy. No recurrent mass in the laryngectomy bed. Surgical clip in the right neck.  Negative for pathologic adenopathy in the neck.  Post radiation changes in the parotid glands which show mild hyper enhancement. Atrophy of the submandibular glands compared with the prior study also related to radiation treatment.  The tongue is normal. Oropharynx is intact. Jugular vein is patent bilaterally. Carotid artery also patent bilaterally. Cervical spondylosis without acute bony abnormality. Paranasal sinuses are clear.  15 mm round homogeneous enhancing mass in the subfrontal region just above the planum sphenoidale compatible with meningioma.  IMPRESSION: Stable postop changes of laryngectomy. No recurrent mass or adenopathy in the neck.  15 mm enhancing mass in the subfrontal region compatible with a meningioma of the planum sphenoidale.   Electronically Signed   By: Franchot Gallo M.D.   On: 05/12/2013 18:34   Ct Chest W Contrast  05/13/2013   CLINICAL DATA:  Generalized weakness. Fall and dehydration. History of  laryngeal cancer.  EXAM: CT CHEST, ABDOMEN, AND PELVIS WITH CONTRAST  TECHNIQUE: Multidetector CT imaging of the chest, abdomen and pelvis was performed following the standard protocol during bolus administration of intravenous contrast.  CONTRAST:  171mL OMNIPAQUE IOHEXOL 300 MG/ML  SOLN  COMPARISON:  03/12/2013  FINDINGS: CT CHEST FINDINGS  THORACIC INLET/BODY WALL:  Tracheal stoma.  No acute abnormality or lymphadenopathy.  MEDIASTINUM:  Normal heart size. Trace pericardial effusion. Atherosclerosis, including the coronary arteries. No lymphadenopathy. No acute vascular findings.  LUNG WINDOWS:  Small bilateral pleural effusions with her water density and layering. There is mild emphysematous changes at the apices bilaterally. 4 mm pulmonary nodule in the right upper lobe (image 25) stable from 2012. 4 mm pulmonary nodule in the upper right lower lobe, stable from 03/12/2013 and likely also present 11/14/2010. Stable nodularity around the lower left major fissure.  OSSEOUS:  Remote appearing  T6 compression fracture with minimal height loss.  CT ABDOMEN AND PELVIS FINDINGS  BODY WALL: Unremarkable.  Liver: No focal abnormality.  Biliary: No evidence of biliary obstruction or stone.  Pancreas: Coarse calcifications within the pancreatic head and to a lesser extent the remaining parenchyma. No ductal dilatation.  Spleen: Unremarkable.  Adrenals: Unremarkable.  Kidneys and ureters: There are extrarenal pelves at baseline based on remote imaging. There is however superimposed hydronephrosis, especially on the right were there is is 7 mm stone in the proximal ureter. No obstructing ureteral process seen on the left.  Bladder: There is a focal area of right lateral bladder wall thickening measuring 2 cm in diameter and approximately 6 mm in height. The bladder is diffusely irregularly thickened, favor chronic outlet obstruction.  Reproductive: Notably the prostate volume is normal.  Bowel: No obstruction. Normal  appendix.  Retroperitoneum: No mass or adenopathy.  Peritoneum: Trace ascites.  Vascular: No acute abnormality.  OSSEOUS: No acute abnormalities.  : 1. No acute intrathoracic or intra-abdominal abnormality. 2. 7 mm proximal right ureteral calculus with hydronephrosis. 3. Bladder wall thickening which suggests chronic outlet obstruction. Thickening is asymmetric to the right, suggest outpatient bladder urothelial workup. 4. Trace ascites and bilateral pleural effusions. 5. Chronic pancreatitis.   Electronically Signed   By: Jorje Guild M.D.   On: 05/13/2013 07:31   Ct Abdomen Pelvis W Contrast  05/13/2013   CLINICAL DATA:  Generalized weakness. Fall and dehydration. History of laryngeal cancer.  EXAM: CT CHEST, ABDOMEN, AND PELVIS WITH CONTRAST  TECHNIQUE: Multidetector CT imaging of the chest, abdomen and pelvis was performed following the standard protocol during bolus administration of intravenous contrast.  CONTRAST:  137mL OMNIPAQUE IOHEXOL 300 MG/ML  SOLN  COMPARISON:  03/12/2013  FINDINGS: CT CHEST FINDINGS  THORACIC INLET/BODY WALL:  Tracheal stoma.  No acute abnormality or lymphadenopathy.  MEDIASTINUM:  Normal heart size. Trace pericardial effusion. Atherosclerosis, including the coronary arteries. No lymphadenopathy. No acute vascular findings.  LUNG WINDOWS:  Small bilateral pleural effusions with her water density and layering. There is mild emphysematous changes at the apices bilaterally. 4 mm pulmonary nodule in the right upper lobe (image 25) stable from 2012. 4 mm pulmonary nodule in the upper right lower lobe, stable from 03/12/2013 and likely also present 11/14/2010. Stable nodularity around the lower left major fissure.  OSSEOUS:  Remote appearing T6 compression fracture with minimal height loss.  CT ABDOMEN AND PELVIS FINDINGS  BODY WALL: Unremarkable.  Liver: No focal abnormality.  Biliary: No evidence of biliary obstruction or stone.  Pancreas: Coarse calcifications within the pancreatic  head and to a lesser extent the remaining parenchyma. No ductal dilatation.  Spleen: Unremarkable.  Adrenals: Unremarkable.  Kidneys and ureters: There are extrarenal pelves at baseline based on remote imaging. There is however superimposed hydronephrosis, especially on the right were there is is 7 mm stone in the proximal ureter. No obstructing ureteral process seen on the left.  Bladder: There is a focal area of right lateral bladder wall thickening measuring 2 cm in diameter and approximately 6 mm in height. The bladder is diffusely irregularly thickened, favor chronic outlet obstruction.  Reproductive: Notably the prostate volume is normal.  Bowel: No obstruction. Normal appendix.  Retroperitoneum: No mass or adenopathy.  Peritoneum: Trace ascites.  Vascular: No acute abnormality.  OSSEOUS: No acute abnormalities.  : 1. No acute intrathoracic or intra-abdominal abnormality. 2. 7 mm proximal right ureteral calculus with hydronephrosis. 3. Bladder wall thickening which suggests chronic outlet obstruction.  Thickening is asymmetric to the right, suggest outpatient bladder urothelial workup. 4. Trace ascites and bilateral pleural effusions. 5. Chronic pancreatitis.   Electronically Signed   By: Jorje Guild M.D.   On: 05/13/2013 07:31     Tawanna Sat, MD 05/13/2013, 9:08 AM PGY-1, [place] Intern pager: [phone], text pages welcome

## 2013-05-13 NOTE — Progress Notes (Signed)
[facility] Attending Daily Note:  Annabell Sabal MD  [phone] pager  Family Practice pager:  [phone] I have discussed this patient with the resident Dr. Lamar Benes. I agree with their findings, assessment, and care plan.  Plan for watching for refeeding syndrome in light of hypophosphatemia.  Replete today and likely able to DC home tomorrow.

## 2013-05-14 DIAGNOSIS — K861 Other chronic pancreatitis: Secondary | ICD-10-CM | POA: Diagnosis not present

## 2013-05-14 DIAGNOSIS — R1310 Dysphagia, unspecified: Secondary | ICD-10-CM | POA: Diagnosis not present

## 2013-05-14 DIAGNOSIS — E785 Hyperlipidemia, unspecified: Secondary | ICD-10-CM | POA: Diagnosis not present

## 2013-05-14 DIAGNOSIS — I509 Heart failure, unspecified: Secondary | ICD-10-CM | POA: Diagnosis not present

## 2013-05-14 DIAGNOSIS — E876 Hypokalemia: Secondary | ICD-10-CM | POA: Diagnosis not present

## 2013-05-14 DIAGNOSIS — M19029 Primary osteoarthritis, unspecified elbow: Secondary | ICD-10-CM | POA: Diagnosis not present

## 2013-05-14 DIAGNOSIS — D649 Anemia, unspecified: Secondary | ICD-10-CM | POA: Diagnosis not present

## 2013-05-14 DIAGNOSIS — Z5189 Encounter for other specified aftercare: Secondary | ICD-10-CM | POA: Diagnosis not present

## 2013-05-14 DIAGNOSIS — I5020 Unspecified systolic (congestive) heart failure: Secondary | ICD-10-CM | POA: Diagnosis not present

## 2013-05-14 DIAGNOSIS — K859 Acute pancreatitis without necrosis or infection, unspecified: Secondary | ICD-10-CM | POA: Diagnosis not present

## 2013-05-14 DIAGNOSIS — E039 Hypothyroidism, unspecified: Secondary | ICD-10-CM | POA: Diagnosis not present

## 2013-05-14 DIAGNOSIS — I5022 Chronic systolic (congestive) heart failure: Secondary | ICD-10-CM

## 2013-05-14 DIAGNOSIS — E860 Dehydration: Secondary | ICD-10-CM | POA: Diagnosis not present

## 2013-05-14 DIAGNOSIS — M1990 Unspecified osteoarthritis, unspecified site: Secondary | ICD-10-CM | POA: Diagnosis not present

## 2013-05-14 DIAGNOSIS — N179 Acute kidney failure, unspecified: Secondary | ICD-10-CM | POA: Diagnosis not present

## 2013-05-14 DIAGNOSIS — C321 Malignant neoplasm of supraglottis: Secondary | ICD-10-CM | POA: Diagnosis not present

## 2013-05-14 DIAGNOSIS — E2749 Other adrenocortical insufficiency: Secondary | ICD-10-CM | POA: Diagnosis not present

## 2013-05-14 DIAGNOSIS — I10 Essential (primary) hypertension: Secondary | ICD-10-CM | POA: Diagnosis not present

## 2013-05-14 DIAGNOSIS — M6281 Muscle weakness (generalized): Secondary | ICD-10-CM | POA: Diagnosis not present

## 2013-05-14 DIAGNOSIS — E869 Volume depletion, unspecified: Secondary | ICD-10-CM | POA: Diagnosis not present

## 2013-05-14 DIAGNOSIS — J9620 Acute and chronic respiratory failure, unspecified whether with hypoxia or hypercapnia: Secondary | ICD-10-CM | POA: Diagnosis not present

## 2013-05-14 LAB — RENAL FUNCTION PANEL
Albumin: 2.3 g/dL — ABNORMAL LOW (ref 3.5–5.2)
BUN: 7 mg/dL (ref 6–23)
CO2: 20 mEq/L (ref 19–32)
Calcium: 8.2 mg/dL — ABNORMAL LOW (ref 8.4–10.5)
Chloride: 100 mEq/L (ref 96–112)
Creatinine, Ser: 0.93 mg/dL (ref 0.50–1.35)
GFR calc non Af Amer: 89 mL/min — ABNORMAL LOW (ref >90)
GLUCOSE: 93 mg/dL (ref 70–99)
Phosphorus: 2.6 mg/dL (ref 2.3–4.6)
Potassium: 4 mEq/L (ref 3.7–5.3)
SODIUM: 133 meq/L — AB (ref 137–147)

## 2013-05-14 LAB — MAGNESIUM: Magnesium: 1.8 mg/dL (ref 1.5–2.5)

## 2013-05-14 LAB — CULTURE, BLOOD (ROUTINE X 2)
CULTURE: NO GROWTH
Culture: NO GROWTH

## 2013-05-14 MED ORDER — BISOPROLOL FUMARATE 10 MG PO TABS: 10.0000 mg | ORAL_TABLET | Freq: Every day | ORAL | Status: DC

## 2013-05-14 MED ORDER — TRAMADOL HCL 50 MG PO TABS: 50.0000 mg | ORAL_TABLET | Freq: Every day | ORAL | Status: DC | PRN

## 2013-05-14 MED ORDER — LEVOTHYROXINE SODIUM 125 MCG PO TABS: 250.0000 ug | ORAL_TABLET | Freq: Every day | ORAL | Status: DC

## 2013-05-14 MED ORDER — ENSURE COMPLETE PO LIQD: 237.0000 mL | Freq: Two times a day (BID) | ORAL | Status: DC

## 2013-05-14 MED ORDER — PANCRELIPASE (LIP-PROT-AMYL) 12000-38000 UNITS PO CPEP: 1.0000 | ORAL_CAPSULE | Freq: Three times a day (TID) | ORAL | Status: DC

## 2013-05-14 MED ORDER — ASPIRIN 81 MG PO TBEC: 81.0000 mg | DELAYED_RELEASE_TABLET | Freq: Every day | ORAL | Status: DC

## 2013-05-14 MED ORDER — PANTOPRAZOLE SODIUM 40 MG PO TBEC: 40.0000 mg | DELAYED_RELEASE_TABLET | Freq: Every day | ORAL | Status: DC

## 2013-05-14 MED ORDER — ONDANSETRON HCL 4 MG PO TABS: 4.0000 mg | ORAL_TABLET | Freq: Three times a day (TID) | ORAL | Status: DC | PRN

## 2013-05-14 NOTE — Progress Notes (Signed)
Family Medicine Teaching Service Daily Progress Note Intern Pager: [phone]  Patient name: [name] Medical record number: [id number] Date of birth: [birth year] Age: 63 y.o. Gender: male  Primary Care Provider: Howard Pouch, DO Consultants: none Code Status: Full  Pt Overview and Major Events to Date:  3/7 - hypotension, so given IVF, started on broad spectrum anti-bx, and help BP meds 3/8 - improved BP, out of SDU 3/9 - pan CT negative for obvious malignancy, chronic pancreatitis 3/10 - added creon  Assessment and Plan: STEPHONE GUM is a 63 y.o. male presenting with generalized weakness, fall, nausea, and dehydration. PMH is significant for larnygeal cancer s/p laryngectomy 2009, CHF HFrEF 45-50%, HTN, HLD, cirrhosis of liver, history of alcoholism, osteoarthritis.   # Nausea/generalized weakness/weight loss: likely secondary to malnutrition from poor oral intake. Pan CT yesterday with no obvious malignancy, did show chronic pancreatitis, findings suggestive of chronic bladder outlet obstruction - oral protonix  - discontinued megace - zofran 4mg  TID scheduled - ensure TID between meals.   # Chronic pancreatitis: called GI, agreed with following him as outpatient but no urgent need for inpatient consult - creon added 1 capsule with meals TID  # Hypothyroidism: home dose 263mcg synthroid daily increased while in hospital - synthroid 260mcg daily  - TSH 99.9, free T3 1.7 (slightly low) and free T4 0.89 (normal)  # Suspected adrenal insufficiency: AM cortisol yesterday low at 2.3. Standard dose ACTH stim test borderline adrenal deficiency; 2.5 AM, 30 min 16.1, 60 min 18.5. This is potentially secondary to longstanding megace use (megestrol has some glucocorticoid activity).   # Concern for refeeding syndrome: phos repleted yesterday, this AM 2.6, potassium 4.0.  # Anemia, Normocytic - stable hemoglobin at 10.1 - cont to monitor  # CHF, HFrEF 45-50% March 2014: does not  currently appear fluid overloaded, no CP and not SOB.  - holding all meds due to hypotension   # Hypertension: home meds bisoprolol, hydralazine, lisinopril. BP 110s-140s/80s-110 in ED.  - holding all meds due to hypotension  # Hypokalemia, resolved: 3.7 on admission, down to 3.5; stable past 2 days (4.1 this morning) - continue to monitor  # Hyperlipidemia:  - continue lipitor 20mg    # Osteoarthritis:  - continue tramadol PRN   FEN/GI: saline lock, soft diet, ensure supplements  Prophylaxis: heparin  Disposition: pending discharge today  Subjective:  No complains this morning. Denies nausea, vomiting, pain, dizziness. When asked if he would like to go to SNF he says he wants to go home.   Objective: Temp:  [98.1 F (36.7 C)-98.3 F (36.8 C)] 98.2 F (36.8 C) (03/11 0528) Pulse Rate:  [82-96] 87 (03/11 0528) Resp:  [16-18] 16 (03/11 0528) BP: (108-137)/(75-90) 137/89 mmHg (03/11 0528) SpO2:  [96 %-100 %] 99 % (03/11 0528) Weight:  [118 lb 13.3 oz (53.9 kg)] 118 lb 13.3 oz (53.9 kg) (03/11 0500) Physical Exam: General: NAD HEENT: EOMI, MMM Neck: stoma pink/clean/dry, no secretions Cardiovascular: RRR, normal s1/s2, no murmur Respiratory: CTAB, normal effort Abdomen: soft, thin, non-tender, non-distended, normoactive bowel sounds Extremities: no edema or cyanosis, WWP. Neuro: alert and oriented, no focal deficits  Laboratory:  Recent Labs Lab 05/11/13 0540 05/12/13 0540 05/13/13 0645  WBC 4.0 4.8 5.1  HGB 9.6* 10.0* 10.1*  HCT 28.1* 28.1* 28.1*  PLT 102* 115* 165    Recent Labs Lab 05/08/13 1213  05/10/13 0320  05/12/13 0540 05/13/13 0645 05/14/13 0530  NA 140  < > 142  < > 135*  132* 133*  K 3.7  < > 3.9  < > 3.9 4.1 4.0  CL 98  < > 108  < > 103 99 100  CO2 20  < > 21  < > 21 20 20   BUN 53*  < > 45*  < > 14 7 7   CREATININE 1.75*  < > 1.98*  < > 1.22 1.01 0.93  CALCIUM 9.7  < > 7.9*  < > 8.0* 8.1* 8.2*  PROT 8.9*  --  6.6  --   --   --   --    BILITOT 2.1*  --  1.2  --   --   --   --   ALKPHOS 94  --  91  --   --   --   --   ALT 39  --  40  --   --   --   --   AST 40*  --  62*  --   --   --   --   GLUCOSE 92  < > 97  < > 92 91 93  < > = values in this interval not displayed.  ACTH stim test: baseline cortisol 2.5, 30 min 16.1, 60 min 18.5  Imaging/Diagnostic Tests: None new  Tawanna Sat, MD 05/14/2013, 8:36 AM PGY-1, [place] Intern pager: [phone], text pages welcome

## 2013-05-14 NOTE — Discharge Instructions (Addendum)
You were admitted for weight loss and weakness. While in the hospital we gave you are prescribed blood pressure medicines which caused your blood pressure to go to low. We monitored your blood work to see if there was evidence of "refeeding syndrome" which is where the phosphorous in your blood gets too low; your phosphorous did drop a little bit and we gave you some oral supplement to replace this and it is now back to normal. We also tested your thyroid function which was low and adrenal gland function which showed that your adrenal glands are also slightly low, which could have been caused by the megace, but not low enough that we feel you need to be started on daily steroids (the hormone the adrenal glands produce). We called your ENT physician who did the surgery and scheduled you a follow up appointment for 3/17 at 8:30am. Please bring the CDs of your CT scans with you to the office visit. The CT scans did not show recurrence of your cancer, but did show that you have chronic pancreatitis, which means that the pancrease may not be making enough enzymes to help with digestion of your food; we have started you on pancreatic enzyme replacement for this and would like you to follow up with a Gastroenterologist as an outpatient.  Medication changes: STOP megace, STOP your blood pressure medications (amlodipine, lisinopril, imdur)  CONTINUE taking your bisoprolol 10mg  daily CONTINUE taking your synthroid, but we have increased you to 279mcg  New medications: Creon (pancreatic enzyme): take 1 capsule with each meal Zofran 4mg  every 8 hours as needed for nausea Aspirin 81mg  daily  You have a follow up appointment scheduled with Dr. Raeford Razor at the [facility] on 3/19. If you would like to be seen by Dr. Raoul Pitch, your regular doctor, please call and reschedule.

## 2013-05-14 NOTE — Discharge Summary (Signed)
Family Medicine Teaching Service  Discharge Note : Attending Jeff Giancarlos Berendt MD Pager [phone] Inpatient Team Pager:  [phone]  I have reviewed this patient and the patient's chart and have discussed discharge planning with the resident at the time of discharge. I agree with the discharge plan as above.    

## 2013-05-14 NOTE — Progress Notes (Signed)
NUTRITION FOLLOW-UP  Pt meets criteria for SEVERE malnutrition in the context of chronic illness as evidenced by weight loss of 19 % in 3 months and severe fat and muscle mass depletion.  DOCUMENTATION CODES Per approved criteria  -Severe malnutrition in the context of chronic illness -Underweight   INTERVENTION: Continue Ensure Complete BID between meals, each container provides 350 kcals and 13 grams of protein.  Continue with Megace.  Pt at risk for refeeding syndrome, monitor phosphorus, potassium, and magnesium labs.  NUTRITION DIAGNOSIS: Inadequate oral intake related to decreased appetite as evidenced by meal completion 25%.   Goal: Pt to meet >/= 90% of their estimated nutrition needs.  Monitor:  PO intake, weight trends, labs, I/O's   ASSESSMENT: 63 y.o. male presenting with generalized weakness, fall, nausea, and dehydration. PMH is significant for larnygeal cancer s/p laryngectomy 2009, CHF HFrEF 30%, HTN, HLD, cirrhosis of liver, history of alcoholism, osteoarthritis. Pt present with nausea, generalized weakness, and weight loss secondary to poor oral intake.   Potassium, Magnesium, and Phosphorus now WNL after repletion.  Pt reports that he is eating 100% of his meals and is drinking one ensure per day but would drink more if offered.   Height: Ht Readings from Last 1 Encounters:  05/08/13 5\' 11"  (1.803 m)    Weight: Wt Readings from Last 1 Encounters:  05/14/13 118 lb 13.3 oz (53.9 kg)    BMI:  Body mass index is 16.58 kg/(m^2). Underweight  Estimated Nutritional Needs: Kcal: 1700-1900 Protein: 80-90 grams Fluid: 1.7-1.9 L/day  Skin: no issues noted, stoma on trach  Diet Order: Criss Rosales soft diet Meal Completion: 100%  EDUCATION NEEDS: -No education needs identified at this time   Intake/Output Summary (Last 24 hours) at 05/14/13 1015 Last data filed at 05/14/13 0900  Gross per 24 hour  Intake   1560 ml  Output   1500 ml  Net     60 ml     Last BM: 3/9   Labs:   Recent Labs Lab 05/10/13 0320  05/12/13 0540 05/13/13 0645 05/14/13 0530  NA 142  < > 135* 132* 133*  K 3.9  < > 3.9 4.1 4.0  CL 108  < > 103 99 100  CO2 21  < > 21 20 20   BUN 45*  < > 14 7 7   CREATININE 1.98*  < > 1.22 1.01 0.93  CALCIUM 7.9*  < > 8.0* 8.1* 8.2*  MG 2.2  --  1.4*  --  1.8  PHOS 1.9*  --   --  1.8* 2.6  GLUCOSE 97  < > 92 91 93  < > = values in this interval not displayed.  CBG (last 3)  No results found for this basename: GLUCAP,  in the last 72 hours  Scheduled Meds: . aspirin EC  81 mg Oral Daily  . atorvastatin  20 mg Oral Daily  . feeding supplement (ENSURE COMPLETE)  237 mL Oral BID BM  . heparin  5,000 Units Subcutaneous 3 times per day  . levothyroxine  250 mcg Oral QAC breakfast  . lipase/protease/amylase  1 capsule Oral TID AC  . ondansetron  4 mg Oral 3 times per day  . pantoprazole  40 mg Oral QHS  . senna-docusate  2 tablet Oral QHS    Continuous Infusions:    Maylon Peppers RD, LDN, CNSC [phone] Pager [phone] After Hours Pager

## 2013-05-14 NOTE — Progress Notes (Signed)
CSW (Clinical Social Worker) prepared pt dc packet and placed with shadow chart. CSW arranged non-emergent ambulance transport. Pt, pt family, pt nurse, and facility informed. CSW signing off.  Vonte Rossin, LCSWA [phone]  

## 2013-05-14 NOTE — Discharge Summary (Signed)
[facility] Discharge Summary  Patient name: [name] Medical record number: [id number] Date of birth: 03/14/1950 Age: 63 y.o. Gender: male Date of Admission: 05/08/2013  Date of Discharge: 05/14/2013 Admitting Physician: Andrena Mews, MD  Primary Care Provider: Howard Pouch, DO Consultants: none  Indication for Hospitalization: Weakness, poor PO intake  Discharge Diagnoses/Problem List:  Generalized weakness Current pancreatitis Hypothyroidism Adrenal insufficiency Normocytic anemia Hypertension Hypotension Chronic diastolic CHF Hypokalemia Hyperlipidemia Osteoarthritis  Disposition: SNF  Discharge Condition: Stable  Brief Hospital Course:  [name] is a 63 y.o. male who presented with generalized weakness, fall, nausea, and dehydration. PMH is significant for larnygeal cancer s/p laryngectomy 2009, CHF HFrEF 45-50%, HTN, HLD, cirrhosis of liver, history of alcoholism, osteoarthritis. His problems were addressed as summarized below.   His nausea/generalized weakness/weight loss was felt to be likely secondary to malnutrition from poor oral intake. A pan CT on 3/9 with no obvious malignancy did show chronic pancreatitis, and findings suggestive of chronic bladder outlet obstruction. He was treated with protonix, home dose megace, and scheduled zofran. A follow up appt with his surgeon at [facility] was scheduled for 3/17.   Chronic pancreatitis: GI was called who agreed with following him as outpatient. Creon was added 1 capsule with meals TID.   Hypothyroidism: His TSH was checked and found to be 99.9. His free T3 1.7 (slightly low) and free T4 0.89 (normal). He was initially continued on his home dose of synthroid at 264mcg synthroid daily which was increased to 250 mcg daily his TSH was checked.  Suspected adrenal insufficiency: An am cortisol on 3/10 was low at 2.3. ACTH stim test borderline adrenal deficiency; 2.5 AM, 30 min  16.1, 60 min 18.5. This is potentially secondary to longstanding megace use (megestrol has some glucocorticoid activity). He also has some continued hyponatremia to 132 this morning, which could also be explained by this.   Concern for refeeding syndrome: His phos was low on 3/7 to 1.9, theis was repleted orally with 500mg  k-phos x 3 doses (69mmol).   Anemia, Normocytic - stable on DC with Hg of 10.1.   CHF, HFrEF 45-50% March 2014: He never appeared fluid overloaded and no symptoms of exacerbation. His meds were held due to hypotension.   # Hypertension: His home meds (bisoprolol, hydralazine, lisinopril) were given and he developed hyotension to 60/30s without mental status changes. He ddid not require pressors but responded to aggressive fluid resuscitation.  We continued holding all meds due to hypotension which was stable at 137/89 prior to dc.   Hypokalemia, resolved before DC, replaced as needed.   Hyperlipidemia, we continued lipitor 20mg    Osteoarthritis: We continued tramadol PRN    Issues for Follow Up:  - Monitor BP and restart home meds very carefully as needed.  - Ensure GI and surgical f/u  Significant Procedures: None  Significant Labs and Imaging:   Recent Labs Lab 05/11/13 0540 05/12/13 0540 05/13/13 0645  WBC 4.0 4.8 5.1  HGB 9.6* 10.0* 10.1*  HCT 28.1* 28.1* 28.1*  PLT 102* 115* 165    Recent Labs Lab 05/08/13 1213  05/10/13 0320 05/11/13 0540 05/12/13 0540 05/13/13 0645 05/14/13 0530  NA 140  < > 142 133* 135* 132* 133*  K 3.7  < > 3.9 3.6* 3.9 4.1 4.0  CL 98  < > 108 103 103 99 100  CO2 20  < > 21 18* 21 20 20   GLUCOSE 92  < > 97 87 92  91 93  BUN 53*  < > 45* 22 14 7 7   CREATININE 1.75*  < > 1.98* 1.45* 1.22 1.01 0.93  CALCIUM 9.7  < > 7.9* 7.6* 8.0* 8.1* 8.2*  MG  --   --  2.2  --  1.4*  --  1.8  PHOS  --   --  1.9*  --   --  1.8* 2.6  ALKPHOS 94  --  91  --   --   --   --   AST 40*  --  62*  --   --   --   --   ALT 39  --  40  --   --    --   --   ALBUMIN 3.5  --  2.5*  --   --   --  2.3*  < > = values in this interval not displayed.  CT neck with 3/9 IMPRESSION:  Stable postop changes of laryngectomy. No recurrent mass or  adenopathy in the neck.  15 mm enhancing mass in the subfrontal region compatible with a  meningioma of the planum sphenoidale.  CT chest, abd, pelvis with 3/9 1. No acute intrathoracic or intra-abdominal abnormality.  2. 7 mm proximal right ureteral calculus with hydronephrosis.  3. Bladder wall thickening which suggests chronic outlet  obstruction. Thickening is asymmetric to the right, suggest  outpatient bladder urothelial workup.  4. Trace ascites and bilateral pleural effusions.  5. Chronic pancreatitis.   Results/Tests Pending at Time of Discharge: Blood culture  Discharge Medications:    Medication List    STOP taking these medications       amLODipine 5 MG tablet  Commonly known as:  NORVASC     hydrALAZINE 25 MG tablet  Commonly known as:  APRESOLINE     isosorbide mononitrate 30 MG 24 hr tablet  Commonly known as:  IMDUR     lisinopril 20 MG tablet  Commonly known as:  PRINIVIL,ZESTRIL     megestrol 400 MG/10ML suspension  Commonly known as:  MEGACE      TAKE these medications       acetaminophen 500 MG tablet  Commonly known as:  TYLENOL  Take 500 mg by mouth daily as needed (pain).     aspirin 81 MG EC tablet  Take 1 tablet (81 mg total) by mouth daily.     atorvastatin 20 MG tablet  Commonly known as:  LIPITOR  Take 1 tablet (20 mg total) by mouth daily.     bisoprolol 10 MG tablet  Commonly known as:  ZEBETA  Take 1 tablet (10 mg total) by mouth daily.     feeding supplement (ENSURE COMPLETE) Liqd  Take 237 mLs by mouth 2 (two) times daily between meals.     guaiFENesin 600 MG 12 hr tablet  Commonly known as:  MUCINEX  Take by mouth daily as needed (congestion).     levothyroxine 125 MCG tablet  Commonly known as:  SYNTHROID, LEVOTHROID  Take 2  tablets (250 mcg total) by mouth daily before breakfast.     lipase/protease/amylase [id number] UNITS Cpep capsule  Commonly known as:  CREON-12/PANCREASE  Take 1 capsule by mouth 3 (three) times daily before meals.     ondansetron 4 MG tablet  Commonly known as:  ZOFRAN  Take 1 tablet (4 mg total) by mouth every 8 (eight) hours as needed for nausea or vomiting.     pantoprazole 40 MG tablet  Commonly known as:  PROTONIX  Take 1 tablet (40 mg total) by mouth at bedtime.     traMADol 50 MG tablet  Commonly known as:  ULTRAM  Take 50 mg by mouth daily as needed (pain).        Discharge Instructions: Please refer to Patient Instructions section of EMR for full details.  Patient was counseled important signs and symptoms that should prompt return to medical care, changes in medications, dietary instructions, activity restrictions, and follow up appointments.   Follow-Up Appointments: Follow-up Information   Follow up with [facility] On 05/22/2013. (at 3pm, For hospital follow up; if you would like to be seen by your PCP Dr. Raoul Pitch please call and reschedule)    Specialty:  Family Medicine   Contact information:   [id number] [place] [address] [phone]      Follow up with Philomena Doheny, MD On 05/20/2013. (at 8:30am for ENT follow-up)    Specialty:  Plastic Surgery   Contact information:   [place] [address] [phone]       Follow up with Rowland Lathe, MD  On 05/20/2013. (8:30 am. )    Contact information:   [facility] Otol[facility]ology Outpatient clinic  [address]       Timmothy Euler, MD 05/14/2013, 12:32 PM PGY-1, [facility]

## 2013-05-14 NOTE — Progress Notes (Addendum)
FMTS Attending Daily Note:  Nathaniel Sabal MD  [phone] pager  Family Practice pager:  [phone] I have discussed this patient with the resident Dr. Lamar Benes.  I agree with their findings, assessment, and care plan.  Plan is for DC back to home today, patient refuses placement options.   As per coding query, I am documenting that patient has cachexia.

## 2013-05-14 NOTE — Progress Notes (Addendum)
Clinical Social Work Department CLINICAL SOCIAL WORK PLACEMENT NOTE 05/14/2013  Patient:  HAWKEN, BIELBY  Account Number:  [id number] Admit date:  05/08/2013  Clinical Social Worker:  Berton Mount, Latanya Presser  Date/time:  05/13/2013 04:00 PM  Clinical Social Work is seeking post-discharge placement for this patient at the following level of care:   SKILLED NURSING   (*CSW will update this form in Epic as items are completed)   05/13/2013  Patient/family provided with [facility] Department of Clinical Social Work's list of facilities offering this level of care within the geographic area requested by the patient (or if unable, by the patient's family).  05/13/2013  Patient/family informed of their freedom to choose among providers that offer the needed level of care, that participate in Medicare, Medicaid or managed care program needed by the patient, have an available bed and are willing to accept the patient.  05/13/2013  Patient/family informed of [facility]' ownership interest in [facility], as well as of the fact that they are under no obligation to receive care at this facility.  PASARR submitted to EDS on 05/13/2013 PASARR number received from EDS on 05/13/2013  FL2 transmitted to all facilities in geographic area requested by pt/family on  05/13/2013 FL2 transmitted to all facilities within larger geographic area on   Patient informed that his/her managed care company has contracts with or will negotiate with  certain facilities, including the following:     Patient/family informed of bed offers received:  05/14/2013 Patient chooses bed at [facility] Physician recommends and patient chooses bed at    Patient to be transferred [facility]  on  05/14/2013 Patient to be transferred to facility by [facility]  The following physician request were entered in Epic:   Additional Comments:  [place], [place]

## 2013-05-14 NOTE — Progress Notes (Addendum)
CSW (Clinical Education officer, museum) spoke with pt daughter [name] about SNF and other options. At this time, pt daughter agrees that [facility] would be best for pt. Pt daughter was given bed offers and has chosen [facility]. CSW to speak with pt and also pt daughter to visit hospital and explain to pt why this would be best decision.   ADDENDUM: CSW spoke with pt and pt is agreeable to SNF. Pt has hesitations but is understanding of why [facility] is safest dc plan. CSW updated MD and informed that facility is requesting dc summary by [place], [place]

## 2013-05-15 ENCOUNTER — Other Ambulatory Visit: Payer: Self-pay | Admitting: *Deleted

## 2013-05-16 LAB — CULTURE, BLOOD (ROUTINE X 2)
Culture: NO GROWTH
Culture: NO GROWTH

## 2013-05-20 ENCOUNTER — Non-Acute Institutional Stay (SKILLED_NURSING_FACILITY): Payer: Medicare Other | Admitting: Internal Medicine

## 2013-05-20 ENCOUNTER — Encounter: Payer: Self-pay | Admitting: Internal Medicine

## 2013-05-20 DIAGNOSIS — E271 Primary adrenocortical insufficiency: Secondary | ICD-10-CM | POA: Insufficient documentation

## 2013-05-20 DIAGNOSIS — E2749 Other adrenocortical insufficiency: Secondary | ICD-10-CM

## 2013-05-20 DIAGNOSIS — E860 Dehydration: Secondary | ICD-10-CM

## 2013-05-20 DIAGNOSIS — F1020 Alcohol dependence, uncomplicated: Secondary | ICD-10-CM

## 2013-05-20 DIAGNOSIS — E785 Hyperlipidemia, unspecified: Secondary | ICD-10-CM | POA: Insufficient documentation

## 2013-05-20 DIAGNOSIS — K861 Other chronic pancreatitis: Secondary | ICD-10-CM | POA: Insufficient documentation

## 2013-05-20 DIAGNOSIS — I10 Essential (primary) hypertension: Secondary | ICD-10-CM

## 2013-05-20 DIAGNOSIS — M1990 Unspecified osteoarthritis, unspecified site: Secondary | ICD-10-CM

## 2013-05-20 DIAGNOSIS — E039 Hypothyroidism, unspecified: Secondary | ICD-10-CM | POA: Diagnosis not present

## 2013-05-20 DIAGNOSIS — IMO0001 Reserved for inherently not codable concepts without codable children: Secondary | ICD-10-CM

## 2013-05-20 DIAGNOSIS — I5022 Chronic systolic (congestive) heart failure: Secondary | ICD-10-CM

## 2013-05-20 NOTE — Assessment & Plan Note (Signed)
EF 45-50% 3/14; pt meds held due to hypotension and not restarted

## 2013-05-20 NOTE — Assessment & Plan Note (Signed)
TSH was 99.9; synthroid increased to 250 mcg daily

## 2013-05-20 NOTE — Assessment & Plan Note (Signed)
Probably from long term megace which has some glucocortocoid activity; some mild hyponatremia may be from this

## 2013-05-20 NOTE — Assessment & Plan Note (Signed)
Sec to ETOH abuse;pancrease added to protonix and megace

## 2013-05-20 NOTE — Assessment & Plan Note (Signed)
Continue tramadol 

## 2013-05-20 NOTE — Assessment & Plan Note (Signed)
With liver cirrhosis and chronic pancreatitis

## 2013-05-20 NOTE — Progress Notes (Signed)
MRN: [id number] Name: [name]  Sex: male Age: 63 y.o. DOB: Apr 07, 1950  [place] #: [facility] Facility/Room: 120B Level Of Care: SNF Provider: Inocencio Homes D Emergency Contacts: Extended Emergency Contact Information Primary Emergency Contact: Reindl,Justin Address: [address]          [place], [place] [address] [place] Mobile Phone: [phone] Relation: Son Secondary Emergency Contact: Rejeana Brock [place] Phone: [phone] Relation: Daughter  Code Status: FULL  Allergies: Vicodin  Chief Complaint  Patient presents with  . nursing home admission    HPI: Patient is 63 y.o. male who had weakness and deconditioning from ETOH, poor po intake and chronic pancreatitis who is admitted for OT/PT.  Past Medical History  Diagnosis Date  . Hypertension   . Laryngeal cancer 2011    Laryngectomy, T3N0M0  . Cirrhosis of liver   . Hypothyroidism   . Alcohol abuse   . CHF (congestive heart failure)     EF 45-50% 05/2012  . Hyperlipidemia   . Arthritis     Past Surgical History  Procedure Laterality Date  . Tracheal surgery    . Tracheostomy        Medication List       This list is accurate as of: 05/20/13  2:16 PM.  Always use your most recent med list.               acetaminophen 500 MG tablet  Commonly known as:  TYLENOL  Take 500 mg by mouth daily as needed (pain).     aspirin 81 MG EC tablet  Take 1 tablet (81 mg total) by mouth daily.     atorvastatin 20 MG tablet  Commonly known as:  LIPITOR  Take 1 tablet (20 mg total) by mouth daily.     bisoprolol 10 MG tablet  Commonly known as:  ZEBETA  Take 1 tablet (10 mg total) by mouth daily.     feeding supplement (ENSURE COMPLETE) Liqd  Take 237 mLs by mouth 2 (two) times daily between meals.     guaiFENesin 600 MG 12 hr tablet  Commonly known as:  MUCINEX  Take by mouth daily as needed (congestion).     levothyroxine 125 MCG tablet  Commonly known  as:  SYNTHROID, LEVOTHROID  Take 2 tablets (250 mcg total) by mouth daily before breakfast.     lipase/protease/amylase 12000 UNITS Cpep capsule  Commonly known as:  CREON-12/PANCREASE  Take 1 capsule by mouth 3 (three) times daily before meals.     ondansetron 4 MG tablet  Commonly known as:  ZOFRAN  Take 1 tablet (4 mg total) by mouth every 8 (eight) hours as needed for nausea or vomiting.     pantoprazole 40 MG tablet  Commonly known as:  PROTONIX  Take 1 tablet (40 mg total) by mouth at bedtime.     traMADol 50 MG tablet  Commonly known as:  ULTRAM  Take 50 mg by mouth daily as needed (pain).     traMADol 50 MG tablet  Commonly known as:  ULTRAM  Take 1 tablet (50 mg total) by mouth daily as needed (pain).        No orders of the defined types were placed in this encounter.    Immunization History  Administered Date(s) Administered  . Influenza Split 11/25/2011    History  Substance Use Topics  . Smoking status: Former Smoker -- 0.20 packs/day for 50 years    Types: Cigarettes  .  Smokeless tobacco: Never Used  . Alcohol Use: Yes     Comment: "rarely"    Family history is noncontributory    Review of Systems  DATA OBTAINED: from patient;Pt has no c/o GENERAL: Feels well no fevers, fatigue, appetite changes SKIN: No itching, rash or wounds EYES: No eye pain, redness, discharge EARS: No earache, tinnitus, change in hearing NOSE: No congestion, drainage or bleeding  MOUTH/THROAT: No mouth or tooth pain, No sore throat, No difficulty chewing or swallowing  RESPIRATORY: No cough, wheezing, SOB CARDIAC: No chest pain, palpitations, lower extremity edema  GI: No abdominal pain, No N/V/D or constipation, No heartburn or reflux  GU: No dysuria, frequency or urgency, or incontinence  MUSCULOSKELETAL: No unrelieved bone/joint pain NEUROLOGIC: No headache, dizziness or focal weakness PSYCHIATRIC: No overt anxiety or sadness. Sleeps well. No behavior issue.   Filed  Vitals:   05/20/13 1400  BP: 130/80  Pulse: 85  Temp: 98.9 F (37.2 C)  Resp: 20    Physical Exam  GENERAL APPEARANCE: Alert, conversant. Appropriately groomed. No acute distress.  SKIN: No diaphoresis rash, or wounds HEAD: Normocephalic, atraumatic  EYES: Conjunctiva/lids clear. Pupils round, reactive. EOMs intact.  EARS: External exam WNL, canals clear. Hearing grossly normal.  NOSE: No deformity or discharge.  MOUTH/THROAT: Lips w/o lesions. RESPIRATORY: Breathing is even, unlabored. Lung sounds are clear   CARDIOVASCULAR: Heart RRR no murmurs, rubs or gallops. No peripheral edema.  GASTROINTESTINAL: Abdomen is soft, non-tender, not distended w/ normal bowel sounds GENITOURINARY: Bladder non tender, not distended  MUSCULOSKELETAL: No abnormal joints or musculature NEUROLOGIC: Oriented X3. Cranial nerves 2-12 grossly intact. Moves all extremities no tremor. PSYCHIATRIC: Mood and affect appropriate to situation, no behavioral issues  Patient Active Problem List   Diagnosis Date Noted  . Pancreatitis, chronic 05/20/2013  . Adrenal insufficiency, primary, iatrogenic 05/20/2013  . Hyperlipidemia   . Osteoarthritis   . Hypotension, unspecified 05/10/2013  . Dehydration 05/08/2013  . Chest pain 03/12/2013  . Loss of weight 03/12/2013  . Sore throat 02/07/2013  . Pain in joint, upper arm 11/28/2012  . Elevated serum creatinine 11/28/2012  . Chronic systolic heart failure 83/15/1761  . Alcohol dependence 01/04/2012  . Decreased appetite 01/03/2012  . Acute and chronic respiratory failure 12/26/2011  . Acute systolic HF (heart failure) 12/26/2011  . Dyspnea 11/10/2011  . Allergic rhinitis 06/22/2011  . Hypothyroidism 10/16/2010  . NEOPLASM, MALIGNANT, LARYNX 01/10/2010  . PULMONARY NODULE 12/31/2007  . ESSENTIAL HYPERTENSION, BENIGN 11/08/2007    CBC    Component Value Date/Time   WBC 5.1 05/13/2013 0645   WBC 4.3 11/04/2009 1559   RBC 3.38* 05/13/2013 0645   RBC 4.17*  11/04/2009 1559   HGB 10.1* 05/13/2013 0645   HGB 12.4* 11/04/2009 1559   HCT 28.1* 05/13/2013 0645   HCT 38.0* 11/04/2009 1559   PLT 165 05/13/2013 0645   PLT 169 11/04/2009 1559   MCV 83.1 05/13/2013 0645   MCV 91.2 11/04/2009 1559   LYMPHSABS 0.9 05/08/2013 1213   LYMPHSABS 2.1 11/04/2009 1559   MONOABS 0.9 05/08/2013 1213   MONOABS 0.4 11/04/2009 1559   EOSABS 0.0 05/08/2013 1213   EOSABS 0.1 11/04/2009 1559   BASOSABS 0.0 05/08/2013 1213   BASOSABS 0.0 11/04/2009 1559    CMP     Component Value Date/Time   NA 133* 05/14/2013 0530   K 4.0 05/14/2013 0530   CL 100 05/14/2013 0530   CO2 20 05/14/2013 0530   GLUCOSE 93 05/14/2013 0530   BUN  7 05/14/2013 0530   CREATININE 0.93 05/14/2013 0530   CREATININE 1.15 12/04/2012 0850   CALCIUM 8.2* 05/14/2013 0530   PROT 6.6 05/10/2013 0320   ALBUMIN 2.3* 05/14/2013 0530   AST 62* 05/10/2013 0320   ALT 40 05/10/2013 0320   ALKPHOS 91 05/10/2013 0320   BILITOT 1.2 05/10/2013 0320   GFRNONAA 89* 05/14/2013 0530   GFRAA >90 05/14/2013 0530    Assessment and Plan  Dehydration Pt presented with nausea , weakness, weight loss felt sec to poor po intake; improved with protonix megace, zofran  Pancreatitis, chronic Sec to ETOH abuse;pancrease added to protonix and megace  Hypothyroidism TSH was 99.9; synthroid increased to 250 mcg daily  Adrenal insufficiency, primary, iatrogenic Probably from long term megace which has some glucocortocoid activity; some mild hyponatremia may be from this  Chronic systolic heart failure EF 45-50% 3/14; pt meds held due to hypotension and not restarted  ESSENTIAL HYPERTENSION, BENIGN All home meds were held due to hypotension-bisoprolol, hydralazine, lisinopril- and were not restarted;BP toiday is fine without meds  Hyperlipidemia Continue zetia and lipitor  Osteoarthritis Continue tramadol  Alcohol dependence With liver cirrhosis and chronic pancreatitis    Hennie Duos, MD

## 2013-05-20 NOTE — Assessment & Plan Note (Signed)
Continue zetia and lipitor. 

## 2013-05-20 NOTE — Assessment & Plan Note (Signed)
All home meds were held due to hypotension-bisoprolol, hydralazine, lisinopril- and were not restarted;BP toiday is fine without meds

## 2013-05-20 NOTE — Assessment & Plan Note (Signed)
Pt presented with nausea , weakness, weight loss felt sec to poor po intake; improved with protonix megace, zofran

## 2013-05-22 ENCOUNTER — Inpatient Hospital Stay: Payer: Medicare Other | Admitting: Family Medicine

## 2013-05-28 DIAGNOSIS — R1310 Dysphagia, unspecified: Secondary | ICD-10-CM | POA: Diagnosis not present

## 2013-05-28 DIAGNOSIS — C321 Malignant neoplasm of supraglottis: Secondary | ICD-10-CM | POA: Diagnosis not present

## 2013-06-03 ENCOUNTER — Non-Acute Institutional Stay (SKILLED_NURSING_FACILITY): Payer: Medicare Other | Admitting: Internal Medicine

## 2013-06-03 ENCOUNTER — Encounter: Payer: Self-pay | Admitting: Internal Medicine

## 2013-06-03 DIAGNOSIS — M1990 Unspecified osteoarthritis, unspecified site: Secondary | ICD-10-CM

## 2013-06-03 DIAGNOSIS — E860 Dehydration: Secondary | ICD-10-CM

## 2013-06-03 DIAGNOSIS — IMO0001 Reserved for inherently not codable concepts without codable children: Secondary | ICD-10-CM

## 2013-06-03 DIAGNOSIS — K861 Other chronic pancreatitis: Secondary | ICD-10-CM

## 2013-06-03 DIAGNOSIS — E039 Hypothyroidism, unspecified: Secondary | ICD-10-CM

## 2013-06-03 DIAGNOSIS — F1020 Alcohol dependence, uncomplicated: Secondary | ICD-10-CM

## 2013-06-03 DIAGNOSIS — E2749 Other adrenocortical insufficiency: Secondary | ICD-10-CM

## 2013-06-03 DIAGNOSIS — E785 Hyperlipidemia, unspecified: Secondary | ICD-10-CM

## 2013-06-03 DIAGNOSIS — I10 Essential (primary) hypertension: Secondary | ICD-10-CM

## 2013-06-03 DIAGNOSIS — I5022 Chronic systolic (congestive) heart failure: Secondary | ICD-10-CM

## 2013-06-03 NOTE — Progress Notes (Signed)
MRN: [id number] Name: [name]  Sex: male Age: 63 y.o. DOB: 13-Feb-1951  [place] #: [facility] Facility/Room: 120B Level Of Care: SNF Provider: Inocencio Homes D Emergency Contacts: Extended Emergency Contact Information Primary Emergency Contact: Longton,Justin Address: [address]          [place], [place] [address] [place] Phone: [phone] Relation: Son Secondary Emergency Contact: Rejeana Brock [place] Phone: [phone] Relation: Daughter  Code Status: FULL  Allergies: Vicodin  Chief Complaint  Patient presents with  . Discharge Note    HPI: Patient is 63 y.o. male who was admitted for PT/OT for weakness from chronic pancreatitis with ETOH abuse who is now stable to be discharged.  Past Medical History  Diagnosis Date  . Hypertension   . Laryngeal cancer 2011    Laryngectomy, T3N0M0  . Cirrhosis of liver   . Hypothyroidism   . Alcohol abuse   . CHF (congestive heart failure)     EF 45-50% 05/2012  . Hyperlipidemia   . Arthritis     Past Surgical History  Procedure Laterality Date  . Tracheal surgery    . Tracheostomy        Medication List       This list is accurate as of: 06/03/13  2:00 PM.  Always use your most recent med list.               acetaminophen 500 MG tablet  Commonly known as:  TYLENOL  Take 500 mg by mouth every 3 (three) hours as needed (pain). Not to exceed 3000mg  /24 hrs     aspirin 81 MG EC tablet  Take 1 tablet (81 mg total) by mouth daily.     atorvastatin 20 MG tablet  Commonly known as:  LIPITOR  Take 1 tablet (20 mg total) by mouth daily.     bisoprolol 10 MG tablet  Commonly known as:  ZEBETA  Take 1 tablet (10 mg total) by mouth daily.     guaiFENesin 600 MG 12 hr tablet  Commonly known as:  MUCINEX  Take by mouth daily as needed (congestion).     levothyroxine 125 MCG tablet  Commonly known as:  SYNTHROID, LEVOTHROID  Take 2 tablets (250 mcg total) by  mouth daily before breakfast.     lipase/protease/amylase 12000 UNITS Cpep capsule  Commonly known as:  CREON-12/PANCREASE  Take 1 capsule by mouth 3 (three) times daily before meals.     megestrol 400 MG/10ML suspension  Commonly known as:  MEGACE  Take 400 mg by mouth daily.     mupirocin ointment 2 %  Commonly known as:  BACTROBAN  Apply 1 application topically daily.     ondansetron 4 MG tablet  Commonly known as:  ZOFRAN  Take 1 tablet (4 mg total) by mouth every 8 (eight) hours as needed for nausea or vomiting.     pantoprazole 40 MG tablet  Commonly known as:  PROTONIX  Take 1 tablet (40 mg total) by mouth at bedtime.     traMADol 50 MG tablet  Commonly known as:  ULTRAM  Take 50 mg by mouth daily as needed (pain).        Meds ordered this encounter  Medications  . megestrol (MEGACE) 400 MG/10ML suspension    Sig: Take 400 mg by mouth daily.  . mupirocin ointment (BACTROBAN) 2 %    Sig: Apply 1 application topically daily.    Immunization History  Administered Date(s) Administered  .  Influenza Split 11/25/2011    History  Substance Use Topics  . Smoking status: Former Smoker -- 0.20 packs/day for 50 years    Types: Cigarettes  . Smokeless tobacco: Never Used  . Alcohol Use: Yes     Comment: "rarely"    Filed Vitals:   06/03/13 1350  BP: 124/82  Pulse: 84  Temp: 97.6 F (36.4 C)  Resp: 20    Physical Exam  GENERAL APPEARANCE: Alert, conversant. Appropriately groomed. No acute distress.  HEENT: Unremarkable. RESPIRATORY: Breathing is even, unlabored. Lung sounds are clear   CARDIOVASCULAR: Heart RRR no murmurs, rubs or gallops. No peripheral edema.  GASTROINTESTINAL: Abdomen is soft, non-tender, not distended w/ normal bowel sounds.  NEUROLOGIC: Cranial nerves 2-12 grossly intact. Moves all extremities no tremor.  Patient Active Problem List   Diagnosis Date Noted  . Pancreatitis, chronic 05/20/2013  . Adrenal insufficiency, primary,  iatrogenic 05/20/2013  . Hyperlipidemia   . Osteoarthritis   . Hypotension, unspecified 05/10/2013  . Dehydration 05/08/2013  . Chest pain 03/12/2013  . Loss of weight 03/12/2013  . Sore throat 02/07/2013  . Pain in joint, upper arm 11/28/2012  . Elevated serum creatinine 11/28/2012  . Chronic systolic heart failure 09/32/3557  . Alcohol dependence 01/04/2012  . Decreased appetite 01/03/2012  . Acute and chronic respiratory failure 12/26/2011  . Acute systolic HF (heart failure) 12/26/2011  . Dyspnea 11/10/2011  . Allergic rhinitis 06/22/2011  . Hypothyroidism 10/16/2010  . NEOPLASM, MALIGNANT, LARYNX 01/10/2010  . PULMONARY NODULE 12/31/2007  . ESSENTIAL HYPERTENSION, BENIGN 11/08/2007    CBC    Component Value Date/Time   WBC 5.1 05/13/2013 0645   WBC 4.3 11/04/2009 1559   RBC 3.38* 05/13/2013 0645   RBC 4.17* 11/04/2009 1559   HGB 10.1* 05/13/2013 0645   HGB 12.4* 11/04/2009 1559   HCT 28.1* 05/13/2013 0645   HCT 38.0* 11/04/2009 1559   PLT 165 05/13/2013 0645   PLT 169 11/04/2009 1559   MCV 83.1 05/13/2013 0645   MCV 91.2 11/04/2009 1559   LYMPHSABS 0.9 05/08/2013 1213   LYMPHSABS 2.1 11/04/2009 1559   MONOABS 0.9 05/08/2013 1213   MONOABS 0.4 11/04/2009 1559   EOSABS 0.0 05/08/2013 1213   EOSABS 0.1 11/04/2009 1559   BASOSABS 0.0 05/08/2013 1213   BASOSABS 0.0 11/04/2009 1559    CMP     Component Value Date/Time   NA 133* 05/14/2013 0530   K 4.0 05/14/2013 0530   CL 100 05/14/2013 0530   CO2 20 05/14/2013 0530   GLUCOSE 93 05/14/2013 0530   BUN 7 05/14/2013 0530   CREATININE 0.93 05/14/2013 0530   CREATININE 1.15 12/04/2012 0850   CALCIUM 8.2* 05/14/2013 0530   PROT 6.6 05/10/2013 0320   ALBUMIN 2.3* 05/14/2013 0530   AST 62* 05/10/2013 0320   ALT 40 05/10/2013 0320   ALKPHOS 91 05/10/2013 0320   BILITOT 1.2 05/10/2013 0320   GFRNONAA 89* 05/14/2013 0530   GFRAA >90 05/14/2013 0530    Assessment and Plan  Patient is being discharged to home in stable and improved condition with  HH/OT/PT.  Hennie Duos, MD

## 2013-06-04 DIAGNOSIS — Z48815 Encounter for surgical aftercare following surgery on the digestive system: Secondary | ICD-10-CM | POA: Diagnosis not present

## 2013-06-04 DIAGNOSIS — I10 Essential (primary) hypertension: Secondary | ICD-10-CM | POA: Diagnosis not present

## 2013-06-04 DIAGNOSIS — Z5189 Encounter for other specified aftercare: Secondary | ICD-10-CM | POA: Diagnosis not present

## 2013-06-04 DIAGNOSIS — I509 Heart failure, unspecified: Secondary | ICD-10-CM | POA: Diagnosis not present

## 2013-06-10 ENCOUNTER — Telehealth: Payer: Self-pay | Admitting: Family Medicine

## 2013-06-10 ENCOUNTER — Ambulatory Visit (INDEPENDENT_AMBULATORY_CARE_PROVIDER_SITE_OTHER): Payer: Medicare Other | Admitting: Family Medicine

## 2013-06-10 ENCOUNTER — Encounter: Payer: Self-pay | Admitting: Family Medicine

## 2013-06-10 VITALS — BP 175/115 | HR 82 | Temp 98.1°F | Ht 71.0 in | Wt 121.0 lb

## 2013-06-10 DIAGNOSIS — R630 Anorexia: Secondary | ICD-10-CM

## 2013-06-10 DIAGNOSIS — I5021 Acute systolic (congestive) heart failure: Secondary | ICD-10-CM

## 2013-06-10 DIAGNOSIS — E785 Hyperlipidemia, unspecified: Secondary | ICD-10-CM

## 2013-06-10 DIAGNOSIS — I10 Essential (primary) hypertension: Secondary | ICD-10-CM

## 2013-06-10 DIAGNOSIS — E039 Hypothyroidism, unspecified: Secondary | ICD-10-CM

## 2013-06-10 DIAGNOSIS — K861 Other chronic pancreatitis: Secondary | ICD-10-CM | POA: Diagnosis not present

## 2013-06-10 LAB — COMPREHENSIVE METABOLIC PANEL WITH GFR
ALT: 63 U/L — ABNORMAL HIGH (ref 0–53)
AST: 47 U/L — ABNORMAL HIGH (ref 0–37)
Albumin: 3.6 g/dL (ref 3.5–5.2)
Alkaline Phosphatase: 122 U/L — ABNORMAL HIGH (ref 39–117)
BUN: 14 mg/dL (ref 6–23)
CO2: 22 meq/L (ref 19–32)
Calcium: 9.2 mg/dL (ref 8.4–10.5)
Chloride: 105 meq/L (ref 96–112)
Creat: 0.7 mg/dL (ref 0.50–1.35)
Glucose, Bld: 84 mg/dL (ref 70–99)
Potassium: 4.4 meq/L (ref 3.5–5.3)
Sodium: 138 meq/L (ref 135–145)
Total Bilirubin: 0.7 mg/dL (ref 0.2–1.2)
Total Protein: 7.8 g/dL (ref 6.0–8.3)

## 2013-06-10 LAB — CBC WITH DIFFERENTIAL/PLATELET
Basophils Absolute: 0 K/uL (ref 0.0–0.1)
Basophils Relative: 0 % (ref 0–1)
Eosinophils Absolute: 0.2 K/uL (ref 0.0–0.7)
Eosinophils Relative: 3 % (ref 0–5)
HCT: 31.9 % — ABNORMAL LOW (ref 39.0–52.0)
Hemoglobin: 11 g/dL — ABNORMAL LOW (ref 13.0–17.0)
Lymphocytes Relative: 29 % (ref 12–46)
Lymphs Abs: 2.2 K/uL (ref 0.7–4.0)
MCH: 28.7 pg (ref 26.0–34.0)
MCHC: 34.5 g/dL (ref 30.0–36.0)
MCV: 83.3 fL (ref 78.0–100.0)
Monocytes Absolute: 0.9 K/uL (ref 0.1–1.0)
Monocytes Relative: 12 % (ref 3–12)
Neutro Abs: 4.3 K/uL (ref 1.7–7.7)
Neutrophils Relative %: 56 % (ref 43–77)
Platelets: 282 K/uL (ref 150–400)
RBC: 3.83 MIL/uL — ABNORMAL LOW (ref 4.22–5.81)
RDW: 16.7 % — ABNORMAL HIGH (ref 11.5–15.5)
WBC: 7.6 K/uL (ref 4.0–10.5)

## 2013-06-10 MED ORDER — LISINOPRIL 10 MG PO TABS: 10.0000 mg | ORAL_TABLET | Freq: Every day | ORAL | Status: DC

## 2013-06-10 NOTE — Assessment & Plan Note (Signed)
Recheck TSH next appt -- will have been 6 weeks since increase to 250 mcg.

## 2013-06-10 NOTE — Progress Notes (Addendum)
Subjective:    [name] is a 63 y.o. male who presents to [facility] today for hospital FU after leaving SNF:  1.  Hospital FU:  Patient admitted for weakness early March.  Long-standing hypertension, actually become hypotensive in house.  Concern was that he was not eating at home. Pan-scanned via CT without any underlying malignancy found.  Began eating better and DC'ed to SNF.  D/Ced to [facility] from hospital.  Just DC'ed 1 week ago from SNF.  Daughter present today, states he is eating better and has much better energy.  Better while at SNF and still at Home.  Asks for home health PT, not currently with any.  Requires home health PT due to prolonged illness, severe weakness, and weight loss.   Endorses taking his medications regularly.  No falls at home.  No pain.    Recommended on DC to see GI and FU with [facility].  Daughter states these appts are later this month.   ROS as above per HPI, otherwise neg.  Pertinently, no chest pain, palpitations, SOB, Fever, Chills, Abd pain, N/V/D.   The following portions of the patient's history were reviewed and updated as appropriate: allergies, current medications, past medical history, family and social history, and problem list. Patient is a nonsmoker.    PMH reviewed.  Past Medical History  Diagnosis Date  . Hypertension   . Laryngeal cancer 2011    Laryngectomy, T3N0M0  . Cirrhosis of liver   . Hypothyroidism   . Alcohol abuse   . CHF (congestive heart failure)     EF 45-50% 05/2012  . Hyperlipidemia   . Arthritis    Past Surgical History  Procedure Laterality Date  . Tracheal surgery    . Tracheostomy      Medications reviewed. Current Outpatient Prescriptions  Medication Sig Dispense Refill  . acetaminophen (TYLENOL) 500 MG tablet Take 500 mg by mouth every 3 (three) hours as needed (pain). Not to exceed 3000mg  /24 hrs      . aspirin EC 81 MG EC tablet Take 1 tablet (81 mg total) by mouth daily.  30 tablet  11  .  atorvastatin (LIPITOR) 20 MG tablet Take 1 tablet (20 mg total) by mouth daily.  30 tablet  11  . bisoprolol (ZEBETA) 10 MG tablet Take 1 tablet (10 mg total) by mouth daily.  30 tablet  5  . guaiFENesin (MUCINEX) 600 MG 12 hr tablet Take by mouth daily as needed (congestion).      Marland Kitchen levothyroxine (SYNTHROID, LEVOTHROID) 125 MCG tablet Take 2 tablets (250 mcg total) by mouth daily before breakfast.  60 tablet  11  . lipase/protease/amylase (CREON-12/PANCREASE) 12000 UNITS CPEP capsule Take 1 capsule by mouth 3 (three) times daily before meals.  270 capsule  4  . megestrol (MEGACE) 400 MG/10ML suspension Take 400 mg by mouth daily.      . mupirocin ointment (BACTROBAN) 2 % Apply 1 application topically daily.      . ondansetron (ZOFRAN) 4 MG tablet Take 1 tablet (4 mg total) by mouth every 8 (eight) hours as needed for nausea or vomiting.  60 tablet  0  . pantoprazole (PROTONIX) 40 MG tablet Take 1 tablet (40 mg total) by mouth at bedtime.  30 tablet  11  . traMADol (ULTRAM) 50 MG tablet Take 50 mg by mouth daily as needed (pain).       No current facility-administered medications for this visit.     Objective:  Physical Exam BP 175/115  Pulse 82  Temp(Src) 98.1 F (36.7 C) (Oral)  Ht 5\' 11"  (1.803 m)  Wt 121 lb (54.885 kg)  BMI 16.88 kg/m2 Gen:  Cachectic male.  Chronically ill-appearing.  No distress. HEENT: EOMI,  MMM Neck: Well healed tracheostomy site, still open.  No signs of infection.  Some surrounding scar tissue.   Cardiac:  Regular rate and rhythm Pulm:  Clear to auscultation bilaterally  Abd:  Soft/nondistended/nontender. Scaphoid Exts: Non edematous BL  LE, warm and well perfused.  Thin Neuro: Strength 5/5 handgrip BL.  UE's 4/5 BL.  LE 4/5 BL. Able to stand on heels and toes equally.  Some balance issues with standing on one leg bilaterally but then can right himself easily.  Otherwise no deficits.   Psych:  Not depressed or anxious appearing.  Linear and coherent  thought process as evidenced by speech pattern. Smiles spontaneously.    No results found for this or any previous visit (from the past 72 hour(s)).

## 2013-06-10 NOTE — Assessment & Plan Note (Addendum)
Elevated. Restart lisinpril today at half dose. Return 2 weeks for BP recheck.

## 2013-06-10 NOTE — Patient Instructions (Addendum)
It was good to see you again today.  Rechecking some labs today.    Your blood pressure is up again.  Keep taking the Bisoprolol.  I have sent in Lisinopril for you as well.  This is at at lower dose.  Come back in 2 weeks for blood pressure recheck.  We will recheck your thyroid at that point as well.   It was good to see you today

## 2013-06-10 NOTE — Telephone Encounter (Signed)
Daughter calls, would like to know if patient should continue taking Lipitor due to him having pancreatitis, cirrhosis & consistent drinking. Please call [name] at [phone]

## 2013-06-10 NOTE — Telephone Encounter (Signed)
Will forward to PCP and MD seen today.

## 2013-06-11 DIAGNOSIS — I10 Essential (primary) hypertension: Secondary | ICD-10-CM | POA: Diagnosis not present

## 2013-06-11 DIAGNOSIS — I509 Heart failure, unspecified: Secondary | ICD-10-CM | POA: Diagnosis not present

## 2013-06-11 DIAGNOSIS — Z5189 Encounter for other specified aftercare: Secondary | ICD-10-CM | POA: Diagnosis not present

## 2013-06-11 DIAGNOSIS — Z48815 Encounter for surgical aftercare following surgery on the digestive system: Secondary | ICD-10-CM | POA: Diagnosis not present

## 2013-06-11 NOTE — Assessment & Plan Note (Signed)
If possible (BP tolerates it) continue to add back (maximize) ACE-I.

## 2013-06-11 NOTE — Assessment & Plan Note (Signed)
Continue Lipitor.   Daughter worried this will interfere with his pancreatitis and cirrhosis.    Discussed risk of strokes vs liver damage in patient with cirrhosis.  ALT not >3 upper limit of normal. Elected to continue statin.

## 2013-06-11 NOTE — Assessment & Plan Note (Signed)
Improving. Encouraged to continue supplementing with protein shakes and pushing food as tolerated.

## 2013-06-11 NOTE — Telephone Encounter (Signed)
Called, no answer, left message

## 2013-06-11 NOTE — Assessment & Plan Note (Signed)
No abdominal pain

## 2013-06-13 DIAGNOSIS — I10 Essential (primary) hypertension: Secondary | ICD-10-CM | POA: Diagnosis not present

## 2013-06-13 DIAGNOSIS — I509 Heart failure, unspecified: Secondary | ICD-10-CM | POA: Diagnosis not present

## 2013-06-13 DIAGNOSIS — Z5189 Encounter for other specified aftercare: Secondary | ICD-10-CM | POA: Diagnosis not present

## 2013-06-13 DIAGNOSIS — Z48815 Encounter for surgical aftercare following surgery on the digestive system: Secondary | ICD-10-CM | POA: Diagnosis not present

## 2013-06-16 DIAGNOSIS — I10 Essential (primary) hypertension: Secondary | ICD-10-CM | POA: Diagnosis not present

## 2013-06-16 DIAGNOSIS — Z48815 Encounter for surgical aftercare following surgery on the digestive system: Secondary | ICD-10-CM | POA: Diagnosis not present

## 2013-06-16 DIAGNOSIS — Z5189 Encounter for other specified aftercare: Secondary | ICD-10-CM | POA: Diagnosis not present

## 2013-06-16 DIAGNOSIS — I509 Heart failure, unspecified: Secondary | ICD-10-CM | POA: Diagnosis not present

## 2013-06-16 NOTE — Telephone Encounter (Signed)
Attempted to call no answer LVM to call back

## 2013-06-16 NOTE — Telephone Encounter (Signed)
If patient is still actively drinking, then he should be advised not to take his Lipitor. A pt with cirrhosis and statin use needs to be looked  individually to see if Lipitor is contraindicated.  Considering he is actively drinking alcohol and his recent labs were mildly elevated, I would advise Lipitor now be held and no longer given. Thanks

## 2013-06-17 DIAGNOSIS — I509 Heart failure, unspecified: Secondary | ICD-10-CM | POA: Diagnosis not present

## 2013-06-17 DIAGNOSIS — Z48815 Encounter for surgical aftercare following surgery on the digestive system: Secondary | ICD-10-CM | POA: Diagnosis not present

## 2013-06-17 DIAGNOSIS — I10 Essential (primary) hypertension: Secondary | ICD-10-CM | POA: Diagnosis not present

## 2013-06-17 DIAGNOSIS — Z5189 Encounter for other specified aftercare: Secondary | ICD-10-CM | POA: Diagnosis not present

## 2013-06-23 DIAGNOSIS — I10 Essential (primary) hypertension: Secondary | ICD-10-CM | POA: Diagnosis not present

## 2013-06-23 DIAGNOSIS — Z48815 Encounter for surgical aftercare following surgery on the digestive system: Secondary | ICD-10-CM | POA: Diagnosis not present

## 2013-06-23 DIAGNOSIS — I509 Heart failure, unspecified: Secondary | ICD-10-CM | POA: Diagnosis not present

## 2013-06-23 DIAGNOSIS — Z5189 Encounter for other specified aftercare: Secondary | ICD-10-CM | POA: Diagnosis not present

## 2013-06-25 ENCOUNTER — Telehealth: Payer: Self-pay | Admitting: Family Medicine

## 2013-06-25 DIAGNOSIS — Z48815 Encounter for surgical aftercare following surgery on the digestive system: Secondary | ICD-10-CM | POA: Diagnosis not present

## 2013-06-25 DIAGNOSIS — I10 Essential (primary) hypertension: Secondary | ICD-10-CM | POA: Diagnosis not present

## 2013-06-25 DIAGNOSIS — Z5189 Encounter for other specified aftercare: Secondary | ICD-10-CM | POA: Diagnosis not present

## 2013-06-25 DIAGNOSIS — I509 Heart failure, unspecified: Secondary | ICD-10-CM | POA: Diagnosis not present

## 2013-06-25 NOTE — Telephone Encounter (Signed)
Please advise.[name] S Laquinda Moller  

## 2013-06-25 NOTE — Telephone Encounter (Signed)
This is obviously higher than goal. IF he is taking his medications as prescribed, then he needs to make an appointment for evaluation in the same day clinic here. IF he has not taken his medications he needs to take his medications. He has had many issues with compliance in the past. Thanks.

## 2013-06-25 NOTE — Telephone Encounter (Signed)
The home health nurse doing PT for the pt called to let us know that his BP was elevated again 180/108. She also said that his nurse was on the way there now. jw

## 2013-06-26 ENCOUNTER — Telehealth: Payer: Self-pay | Admitting: Family Medicine

## 2013-06-26 NOTE — Telephone Encounter (Signed)
Yesterday nurse so [name].  Bp is out of control.  Reading was 165/105.  Daughters discontinued his bp medications Hydralazine 25 mg and Amlodipine 5mg . while in hospital and haven't restarted yet.  Please contact daughter [name] to advise on  continuance of medication.

## 2013-06-26 NOTE — Telephone Encounter (Signed)
I responded to his high bp yesterday, my answer is unchanged. "IF he is taking his medications as prescribed, then he needs to make an appointment for evaluation in the same day clinic here. IF he has not taken his medications he needs to take his medications." He has had many issues with compliance in the past. Thanks

## 2013-06-26 NOTE — Telephone Encounter (Signed)
Left detailed message on [name]'s voicemail.[name]

## 2013-07-02 DIAGNOSIS — I509 Heart failure, unspecified: Secondary | ICD-10-CM | POA: Diagnosis not present

## 2013-07-02 DIAGNOSIS — Z48815 Encounter for surgical aftercare following surgery on the digestive system: Secondary | ICD-10-CM | POA: Diagnosis not present

## 2013-07-02 DIAGNOSIS — I10 Essential (primary) hypertension: Secondary | ICD-10-CM | POA: Diagnosis not present

## 2013-07-02 DIAGNOSIS — Z5189 Encounter for other specified aftercare: Secondary | ICD-10-CM | POA: Diagnosis not present

## 2013-07-09 ENCOUNTER — Telehealth: Payer: Self-pay | Admitting: Family Medicine

## 2013-07-09 ENCOUNTER — Other Ambulatory Visit: Payer: Self-pay | Admitting: *Deleted

## 2013-07-09 DIAGNOSIS — Z48815 Encounter for surgical aftercare following surgery on the digestive system: Secondary | ICD-10-CM | POA: Diagnosis not present

## 2013-07-09 DIAGNOSIS — Z5189 Encounter for other specified aftercare: Secondary | ICD-10-CM | POA: Diagnosis not present

## 2013-07-09 DIAGNOSIS — I509 Heart failure, unspecified: Secondary | ICD-10-CM | POA: Diagnosis not present

## 2013-07-09 DIAGNOSIS — I10 Essential (primary) hypertension: Secondary | ICD-10-CM | POA: Diagnosis not present

## 2013-07-09 NOTE — Telephone Encounter (Signed)
[name] called to report that her father's BP today 5/6 was 170/110. She is bring him in 5/7 and has another appointment on 05/15. jw

## 2013-07-09 NOTE — Telephone Encounter (Signed)
She should repeat that BP and if it is > 774 systolic or >142 diastolic she needs to have him come into ED for evaluation and labs (if he has already taken his prescribed medications and his BP is still that high, otherwise he needs to take his medications and then repeat his BP reading).   If his repeat BP is  below the above parameters  then Please make sure they bring in all his current medications with him to his appointment tomorrow and any daily BP readings she has for him. Thanks.

## 2013-07-09 NOTE — Telephone Encounter (Signed)
Left voice message regarding for daughter to call back regarding blood pressure medication.  Derl Barrow, RN

## 2013-07-10 ENCOUNTER — Ambulatory Visit (INDEPENDENT_AMBULATORY_CARE_PROVIDER_SITE_OTHER): Payer: Medicare Other | Admitting: Family Medicine

## 2013-07-10 ENCOUNTER — Encounter: Payer: Self-pay | Admitting: Family Medicine

## 2013-07-10 VITALS — BP 151/94 | HR 92 | Temp 98.8°F | Ht 71.0 in | Wt 127.0 lb

## 2013-07-10 DIAGNOSIS — I10 Essential (primary) hypertension: Secondary | ICD-10-CM

## 2013-07-10 MED ORDER — HYDRALAZINE HCL 25 MG PO TABS: ORAL_TABLET | ORAL | Status: DC

## 2013-07-10 NOTE — Progress Notes (Signed)
   Subjective:    Patient ID: [name], male    DOB: 02-12-1951, 63 y.o.   MRN: [id number]  HPI HERSEL MCMEEN is a 63 y.o. male presenting to family medicine clinic for evaluation of elevated blood pressure despite compliance with medications.  Hypertension: Patient presents to family practice today in accompaniment of his wife and daughter. Daughter states that patient is compliant with medications now that she has been helping him daily. [name] medications have recently changed after hospitalization due to dehydration and hypotension. Patient with history  HFrEF 40-50% ejection fraction in March of 2014. Patient is seen in the CHF clinic, but hasn't been there in about 6 months. He is currently on DiaBeta 10 mg daily and lisinopril 10 mg daily. Patient has been experiencing morning blood pressures that seem to be normal with systolic between 749 to 449 and diastolic between 67-59. In the afternoon his blood pressures are increasing greater than 170/110. [name] daughter has been supplementing hydralazine 25 mg when his blood pressure is higher in the afternoon. This is an old prescription, however it seems to be working, per his daughter. She states that it brought his blood pressure down to 130/80's. Patient denies chest pain, shortness of breath, palpitations or dizziness. He does admit to a mild frontal headache. No changes in mental status. No syncope or near-syncope events.  Review of Systems Negative, with the exception of above mentioned in HPI      Objective:   Physical Exam BP 151/94  Pulse 92  Temp(Src) 98.8 F (37.1 C) (Oral)  Ht 5\' 11"  (1.803 m)  Wt 127 lb (57.607 kg)  BMI 17.72 kg/m2  SpO2 96% Gen: Thin African American man in no acute distress, nontoxic in appearance HEENT: AT. [place].  Bilateral eyes without injections or icterus. MMM. Tracheostomy. CV: RRR, no murmurs clicks gallops or rubs. Chest: CTAB, no wheeze or crackles Abd: Soft. NTND. BS present. No  Masses palpated.  Ext: No erythema. No edema.  Neuro: Alert, oriented x4. [place]. EOM intact. Cranial nerves II through XII grossly intact no focal deficits noted.

## 2013-07-10 NOTE — Patient Instructions (Signed)
Please start taking her hydralazine 25 mg around midmorning to lunch and at dinner time. I will call in refills for you. Please make an appointment with CHF clinic for your follow up.

## 2013-07-10 NOTE — Assessment & Plan Note (Addendum)
Blood pressure in clinic today is 151/94.  Will add back hydralazine 25 mg to be taken prior to lunch and after dinner. Family to monitor blood pressures and call office if BPs are consistently over 140/90. Patient to make appointment to followup with CHF clinic.

## 2013-07-17 DIAGNOSIS — Z5189 Encounter for other specified aftercare: Secondary | ICD-10-CM | POA: Diagnosis not present

## 2013-07-17 DIAGNOSIS — I10 Essential (primary) hypertension: Secondary | ICD-10-CM | POA: Diagnosis not present

## 2013-07-17 DIAGNOSIS — Z48815 Encounter for surgical aftercare following surgery on the digestive system: Secondary | ICD-10-CM | POA: Diagnosis not present

## 2013-07-17 DIAGNOSIS — I509 Heart failure, unspecified: Secondary | ICD-10-CM | POA: Diagnosis not present

## 2013-07-18 ENCOUNTER — Ambulatory Visit: Payer: Medicare Other | Admitting: Family Medicine

## 2013-07-22 DIAGNOSIS — I10 Essential (primary) hypertension: Secondary | ICD-10-CM | POA: Diagnosis not present

## 2013-07-22 DIAGNOSIS — I509 Heart failure, unspecified: Secondary | ICD-10-CM | POA: Diagnosis not present

## 2013-07-22 DIAGNOSIS — Z48815 Encounter for surgical aftercare following surgery on the digestive system: Secondary | ICD-10-CM | POA: Diagnosis not present

## 2013-07-22 DIAGNOSIS — Z5189 Encounter for other specified aftercare: Secondary | ICD-10-CM | POA: Diagnosis not present

## 2013-08-07 ENCOUNTER — Telehealth: Payer: Self-pay | Admitting: *Deleted

## 2013-08-07 NOTE — Telephone Encounter (Signed)
Left voice messge for pt stating that Dr. Raoul Pitch was unable to send/ fax office notes regarding oxygen therapy due to pt did not keep las appt.  Tried call [facility], unable to leave voice message with number provided.  Will fax form back to [facility].  Derl Barrow, RN

## 2013-12-07 ENCOUNTER — Emergency Department (HOSPITAL_COMMUNITY): Payer: Medicare Other

## 2013-12-07 ENCOUNTER — Emergency Department (HOSPITAL_COMMUNITY)
Admission: EM | Admit: 2013-12-07 | Discharge: 2013-12-07 | Disposition: A | Discharge status: Home / Self Care | Payer: Medicare Other | Attending: Emergency Medicine | Admitting: Emergency Medicine

## 2013-12-07 ENCOUNTER — Encounter (HOSPITAL_COMMUNITY): Payer: Self-pay | Admitting: Emergency Medicine

## 2013-12-07 DIAGNOSIS — E039 Hypothyroidism, unspecified: Secondary | ICD-10-CM | POA: Diagnosis not present

## 2013-12-07 DIAGNOSIS — S51822A Laceration with foreign body of left forearm, initial encounter: Secondary | ICD-10-CM | POA: Diagnosis not present

## 2013-12-07 DIAGNOSIS — E785 Hyperlipidemia, unspecified: Secondary | ICD-10-CM | POA: Diagnosis not present

## 2013-12-07 DIAGNOSIS — M1990 Unspecified osteoarthritis, unspecified site: Secondary | ICD-10-CM | POA: Insufficient documentation

## 2013-12-07 DIAGNOSIS — Y9289 Other specified places as the place of occurrence of the external cause: Secondary | ICD-10-CM | POA: Insufficient documentation

## 2013-12-07 DIAGNOSIS — S59919A Unspecified injury of unspecified forearm, initial encounter: Secondary | ICD-10-CM | POA: Diagnosis not present

## 2013-12-07 DIAGNOSIS — I10 Essential (primary) hypertension: Secondary | ICD-10-CM | POA: Diagnosis not present

## 2013-12-07 DIAGNOSIS — Z8521 Personal history of malignant neoplasm of larynx: Secondary | ICD-10-CM | POA: Insufficient documentation

## 2013-12-07 DIAGNOSIS — I509 Heart failure, unspecified: Secondary | ICD-10-CM | POA: Insufficient documentation

## 2013-12-07 DIAGNOSIS — Z7982 Long term (current) use of aspirin: Secondary | ICD-10-CM | POA: Diagnosis not present

## 2013-12-07 DIAGNOSIS — S51812A Laceration without foreign body of left forearm, initial encounter: Secondary | ICD-10-CM | POA: Diagnosis not present

## 2013-12-07 DIAGNOSIS — Z87891 Personal history of nicotine dependence: Secondary | ICD-10-CM | POA: Diagnosis not present

## 2013-12-07 DIAGNOSIS — Z79899 Other long term (current) drug therapy: Secondary | ICD-10-CM | POA: Insufficient documentation

## 2013-12-07 DIAGNOSIS — W01198A Fall on same level from slipping, tripping and stumbling with subsequent striking against other object, initial encounter: Secondary | ICD-10-CM | POA: Diagnosis not present

## 2013-12-07 DIAGNOSIS — Z8719 Personal history of other diseases of the digestive system: Secondary | ICD-10-CM | POA: Diagnosis not present

## 2013-12-07 DIAGNOSIS — Z23 Encounter for immunization: Secondary | ICD-10-CM | POA: Diagnosis not present

## 2013-12-07 DIAGNOSIS — Y9301 Activity, walking, marching and hiking: Secondary | ICD-10-CM | POA: Insufficient documentation

## 2013-12-07 DIAGNOSIS — S51819A Laceration without foreign body of unspecified forearm, initial encounter: Secondary | ICD-10-CM | POA: Diagnosis not present

## 2013-12-07 MED ORDER — LIDOCAINE-EPINEPHRINE 2 %-1:100000 IJ SOLN
20.0000 mL | Freq: Once | INTRAMUSCULAR | Status: AC
  Administered 2013-12-07: 20 mL
  Filled 2013-12-07: qty 1

## 2013-12-07 MED ORDER — TETANUS-DIPHTH-ACELL PERTUSSIS 5-2.5-18.5 LF-MCG/0.5 IM SUSP
0.5000 mL | Freq: Once | INTRAMUSCULAR | Status: AC
  Administered 2013-12-07: 0.5 mL via INTRAMUSCULAR
  Filled 2013-12-07: qty 0.5

## 2013-12-07 NOTE — ED Notes (Addendum)
Pt from home c/o a fall last night in which he injured his left forearm and hit his head. He and his daughter denies that he lost had LOC. Denies thinners. Large laceration to left elbow. Dressing from home removed and guaze dressing applied with normal saline.

## 2013-12-07 NOTE — ED Provider Notes (Signed)
CSN: [id number]     Arrival date & time 12/07/13  1313 History   First MD Initiated Contact with Patient 12/07/13 1334     Chief Complaint  Patient presents with  . Fall  . Arm Pain     (Consider location/radiation/quality/duration/timing/severity/associated sxs/prior Treatment) HPI 63 year old male presents after a fall at approximately 3 AM. He states he was walking and tripped on the rug. His left forearm hit a corner he sustained a laceration. He did not realize a laceration to the woke back up after going back to sleep. He states he "grazed his head". He did not land on his head otherwise. He did not get knocked out. No headache, nausea, vomiting, or weakness or numbness. Unsure of last tetanus shot. Is on baby ASA but no other blood thinners. It has been approximately 10-11 hours since the injury was sustained. Patient showed lac to daughter (lives with daughter) after he noticed his arm hurting this AM.  Past Medical History  Diagnosis Date  . Hypertension   . Laryngeal cancer 2011    Laryngectomy, T3N0M0  . Cirrhosis of liver   . Hypothyroidism   . Alcohol abuse   . CHF (congestive heart failure)     EF 45-50% 05/2012  . Hyperlipidemia   . Arthritis    Past Surgical History  Procedure Laterality Date  . Tracheal surgery    . Tracheostomy     No family history on file. History  Substance Use Topics  . Smoking status: Former Smoker -- 0.20 packs/day for 50 years    Types: Cigarettes  . Smokeless tobacco: Never Used  . Alcohol Use: Yes     Comment: "rarely"    Review of Systems  Gastrointestinal: Negative for vomiting.  Skin: Positive for wound.  Neurological: Negative for syncope, weakness and headaches.  All other systems reviewed and are negative.     Allergies  Vicodin  Home Medications   Prior to Admission medications   Medication Sig Start Date End Date Taking? Authorizing Provider  atorvastatin (LIPITOR) 20 MG tablet Take 20 mg by mouth daily.    Yes Historical Provider, MD  acetaminophen (TYLENOL) 500 MG tablet Take 500 mg by mouth every 3 (three) hours as needed (pain). Not to exceed 3000mg  /24 hrs    Historical Provider, MD  aspirin EC 81 MG EC tablet Take 1 tablet (81 mg total) by mouth daily. 05/14/13   Leone Brand, MD  bisoprolol (ZEBETA) 10 MG tablet Take 1 tablet (10 mg total) by mouth daily. 05/14/13   Leone Brand, MD  guaiFENesin (MUCINEX) 600 MG 12 hr tablet Take by mouth daily as needed (congestion).    Historical Provider, MD  hydrALAZINE (APRESOLINE) 25 MG tablet Take two times a day at lunch and after dinner 07/10/13   Renee A Kuneff, DO  levothyroxine (SYNTHROID, LEVOTHROID) 125 MCG tablet Take 2 tablets (250 mcg total) by mouth daily before breakfast. 05/14/13   Leone Brand, MD  lipase/protease/amylase (CREON-12/PANCREASE) 12000 UNITS CPEP capsule Take 1 capsule by mouth 3 (three) times daily before meals. 05/14/13   Leone Brand, MD  lisinopril (PRINIVIL,ZESTRIL) 10 MG tablet Take 1 tablet (10 mg total) by mouth daily. 06/10/13   Alveda Reasons, MD  megestrol (MEGACE) 400 MG/10ML suspension Take 400 mg by mouth daily.    Historical Provider, MD  mupirocin ointment (BACTROBAN) 2 % Apply 1 application topically daily.    Historical Provider, MD  ondansetron (ZOFRAN) 4 MG tablet Take 1  tablet (4 mg total) by mouth every 8 (eight) hours as needed for nausea or vomiting. 05/14/13   Leone Brand, MD  pantoprazole (PROTONIX) 40 MG tablet Take 1 tablet (40 mg total) by mouth at bedtime. 05/14/13   Leone Brand, MD  traMADol (ULTRAM) 50 MG tablet Take 50 mg by mouth daily as needed (pain).    Historical Provider, MD   BP 178/114  Pulse 88  Temp(Src) 99 F (37.2 C) (Oral)  Resp 18  SpO2 100% Physical Exam  Nursing note and vitals reviewed. Constitutional: He is oriented to person, place, and time. He appears well-developed and well-nourished. No distress.  HENT:  Head: Normocephalic and atraumatic.  Right Ear:  External ear normal.  Left Ear: External ear normal.  Nose: Nose normal.  No signs of trauma to left scalp/forehead. No tenderness or swelling  Eyes: Right eye exhibits no discharge. Left eye exhibits no discharge.  Neck: Neck supple.  Cardiovascular: Normal rate and intact distal pulses.   Pulmonary/Chest: Effort normal.  Abdominal: He exhibits no distension.  Neurological: He is alert and oriented to person, place, and time.  Skin: Skin is warm and dry.    ED Course  LACERATION REPAIR Date/Time: 12/07/2013 2:38 PM Performed by: Sherwood Gambler T Authorized by: Sherwood Gambler T Consent: Verbal consent obtained. Risks and benefits: risks, benefits and alternatives were discussed Consent given by: patient Time out: Immediately prior to procedure a "time out" was called to verify the correct patient, procedure, equipment, support staff and site/side marked as required. Body area: upper extremity Location details: left lower arm Laceration length: 5 cm Foreign bodies: no foreign bodies Tendon involvement: none Nerve involvement: none Vascular damage: no Anesthesia: local infiltration Local anesthetic: lidocaine 2% with epinephrine Anesthetic total: 10 ml Patient sedated: no Preparation: Patient was prepped and draped in the usual sterile fashion. Irrigation solution: saline Irrigation method: syringe Amount of cleaning: extensive Debridement: none Degree of undermining: none Skin closure: 4-0 Prolene Number of sutures: 8 Technique: simple Approximation: close Approximation difficulty: simple Dressing: 4x4 sterile gauze and antibiotic ointment Patient tolerance: Patient tolerated the procedure well with no immediate complications.   (including critical care time) Labs Review Labs Reviewed - No data to display  Imaging Review Dg Forearm Left  12/07/2013   CLINICAL DATA:  Patient fell and hit edge of door frame, laceration to proximal left lateral forearm  EXAM: LEFT  FOREARM - 2 VIEW  COMPARISON:  None.  FINDINGS: There is no evidence of fracture or other focal bone lesions. Soft tissues are unremarkable.  IMPRESSION: Negative.   Electronically Signed   By: Skipper Cliche M.D.   On: 12/07/2013 14:06     EKG Interpretation None      MDM   Final diagnoses:  Forearm laceration, left, initial encounter    Patient is NV intact. Superficial laceration with uncomplicated repair. No signs of foreign bodies, and wound appears clean. States he "grazed" his head, but has no headache, vomiting, signs of injury and has had no issues for over 10 hours. Do not feel he needs his head imaged. I did discuss increased infection risk given delayed presentation, patient is ok with getting lac repaired and will watch his wound closely.    Ephraim Hamburger, MD 12/07/13 [phone]

## 2013-12-07 NOTE — ED Notes (Addendum)
Pt and family leaving with ALL belongings they arrived with. They were advised to follow up with PCP in 7 days for suture removal. Pt was wheeled out by [place] [facility] in wheelchair

## 2013-12-07 NOTE — ED Notes (Signed)
Pt in XRAY 

## 2013-12-07 NOTE — ED Notes (Signed)
Bulky dressing applied to left elbow.

## 2013-12-07 NOTE — ED Notes (Addendum)
MD at bedside. 

## 2013-12-07 NOTE — Discharge Instructions (Signed)
Laceration Care, Adult °A laceration is a cut or lesion that goes through all layers of the skin and into the tissue just beneath the skin. °TREATMENT  °Some lacerations may not require closure. Some lacerations may not be able to be closed due to an increased risk of infection. It is important to see your caregiver as soon as possible after an injury to minimize the risk of infection and maximize the opportunity for successful closure. °If closure is appropriate, pain medicines may be given, if needed. The wound will be cleaned to help prevent infection. Your caregiver will use stitches (sutures), staples, wound glue (adhesive), or skin adhesive strips to repair the laceration. These tools bring the skin edges together to allow for faster healing and a better cosmetic outcome. However, all wounds will heal with a scar. Once the wound has healed, scarring can be minimized by covering the wound with sunscreen during the day for 1 full year. °HOME CARE INSTRUCTIONS  °For sutures or staples: °· Keep the wound clean and dry. °· If you were given a bandage (dressing), you should change it at least once a day. Also, change the dressing if it becomes wet or dirty, or as directed by your caregiver. °· Wash the wound with soap and water 2 times a day. Rinse the wound off with water to remove all soap. Pat the wound dry with a clean towel. °· After cleaning, apply a thin layer of the antibiotic ointment as recommended by your caregiver. This will help prevent infection and keep the dressing from sticking. °· You may shower as usual after the first 24 hours. Do not soak the wound in water until the sutures are removed. °· Only take over-the-counter or prescription medicines for pain, discomfort, or fever as directed by your caregiver. °· Get your sutures or staples removed as directed by your caregiver. °For skin adhesive strips: °· Keep the wound clean and dry. °· Do not get the skin adhesive strips wet. You may bathe  carefully, using caution to keep the wound dry. °· If the wound gets wet, pat it dry with a clean towel. °· Skin adhesive strips will fall off on their own. You may trim the strips as the wound heals. Do not remove skin adhesive strips that are still stuck to the wound. They will fall off in time. °For wound adhesive: °· You may briefly wet your wound in the shower or bath. Do not soak or scrub the wound. Do not swim. Avoid periods of heavy perspiration until the skin adhesive has fallen off on its own. After showering or bathing, gently pat the wound dry with a clean towel. °· Do not apply liquid medicine, cream medicine, or ointment medicine to your wound while the skin adhesive is in place. This may loosen the film before your wound is healed. °· If a dressing is placed over the wound, be careful not to apply tape directly over the skin adhesive. This may cause the adhesive to be pulled off before the wound is healed. °· Avoid prolonged exposure to sunlight or tanning lamps while the skin adhesive is in place. Exposure to ultraviolet light in the first year will darken the scar. °· The skin adhesive will usually remain in place for 5 to 10 days, then naturally fall off the skin. Do not pick at the adhesive film. °You may need a tetanus shot if: °· You cannot remember when you had your last tetanus shot. °· You have never had a tetanus   shot. °If you get a tetanus shot, your arm may swell, get red, and feel warm to the touch. This is common and not a problem. If you need a tetanus shot and you choose not to have one, there is a rare chance of getting tetanus. Sickness from tetanus can be serious. °SEEK MEDICAL CARE IF:  °· You have redness, swelling, or increasing pain in the wound. °· You see a red line that goes away from the wound. °· You have yellowish-white fluid (pus) coming from the wound. °· You have a fever. °· You notice a bad smell coming from the wound or dressing. °· Your wound breaks open before or  after sutures have been removed. °· You notice something coming out of the wound such as wood or glass. °· Your wound is on your hand or foot and you cannot move a finger or toe. °SEEK IMMEDIATE MEDICAL CARE IF:  °· Your pain is not controlled with prescribed medicine. °· You have severe swelling around the wound causing pain and numbness or a change in color in your arm, hand, leg, or foot. °· Your wound splits open and starts bleeding. °· You have worsening numbness, weakness, or loss of function of any joint around or beyond the wound. °· You develop painful lumps near the wound or on the skin anywhere on your body. °MAKE SURE YOU:  °· Understand these instructions. °· Will watch your condition. °· Will get help right away if you are not doing well or get worse. °Document Released: 02/20/2005 Document Revised: 05/15/2011 Document Reviewed: 08/16/2010 °[employer]® Patient Information ©2015 [employer]. This information is not intended to replace advice given to you by your health care provider. Make sure you discuss any questions you have with your health care provider. ° °Sutured Wound Care °Sutures are stitches that can be used to close wounds. Wound care helps prevent pain and infection.  °HOME CARE INSTRUCTIONS  °· Rest and elevate the injured area until all the pain and swelling are gone. °· Only take over-the-counter or prescription medicines for pain, discomfort, or fever as directed by your caregiver. °· After 48 hours, gently wash the area with mild soap and water once a day, or as directed. Rinse off the soap. Pat the area dry with a clean towel. Do not rub the wound. This may cause bleeding. °· Follow your caregiver's instructions for how often to change the bandage (dressing). Stop using a dressing after 2 days or after the wound stops draining. °· If the dressing sticks, moisten it with soapy water and gently remove it. °· Apply ointment on the wound as directed. °· Avoid stretching a sutured  wound. °· Drink enough fluids to keep your urine clear or pale yellow. °· Follow up with your caregiver for suture removal as directed. °· Use sunscreen on your wound for the next 3 to 6 months so the scar will not darken. °SEEK IMMEDIATE MEDICAL CARE IF:  °· Your wound becomes red, swollen, hot, or tender. °· You have increasing pain in the wound. °· You have a red streak that extends from the wound. °· There is pus coming from the wound. °· You have a fever. °· You have shaking chills. °· There is a bad smell coming from the wound. °· You have persistent bleeding from the wound. °MAKE SURE YOU:  °· Understand these instructions. °· Will watch your condition. °· Will get help right away if you are not doing well or get worse. °Document Released:   03/30/2004 Document Revised: 05/15/2011 Document Reviewed: 06/26/2010 °[employer]® Patient Information ©2015 [employer]. This information is not intended to replace advice given to you by your health care provider. Make sure you discuss any questions you have with your health care provider. ° °

## 2013-12-25 ENCOUNTER — Emergency Department (HOSPITAL_COMMUNITY)
Admission: EM | Admit: 2013-12-25 | Discharge: 2013-12-25 | Disposition: A | Discharge status: Home / Self Care | Payer: Medicare Other | Attending: Emergency Medicine | Admitting: Emergency Medicine

## 2013-12-25 ENCOUNTER — Emergency Department (HOSPITAL_COMMUNITY): Payer: Medicare Other

## 2013-12-25 ENCOUNTER — Encounter (HOSPITAL_COMMUNITY): Payer: Self-pay | Admitting: Emergency Medicine

## 2013-12-25 DIAGNOSIS — M1990 Unspecified osteoarthritis, unspecified site: Secondary | ICD-10-CM | POA: Insufficient documentation

## 2013-12-25 DIAGNOSIS — Z7982 Long term (current) use of aspirin: Secondary | ICD-10-CM | POA: Insufficient documentation

## 2013-12-25 DIAGNOSIS — R030 Elevated blood-pressure reading, without diagnosis of hypertension: Secondary | ICD-10-CM

## 2013-12-25 DIAGNOSIS — Z8719 Personal history of other diseases of the digestive system: Secondary | ICD-10-CM | POA: Diagnosis not present

## 2013-12-25 DIAGNOSIS — T814XXA Infection following a procedure, initial encounter: Secondary | ICD-10-CM | POA: Insufficient documentation

## 2013-12-25 DIAGNOSIS — I509 Heart failure, unspecified: Secondary | ICD-10-CM | POA: Diagnosis not present

## 2013-12-25 DIAGNOSIS — J029 Acute pharyngitis, unspecified: Secondary | ICD-10-CM | POA: Diagnosis not present

## 2013-12-25 DIAGNOSIS — Z85818 Personal history of malignant neoplasm of other sites of lip, oral cavity, and pharynx: Secondary | ICD-10-CM | POA: Diagnosis not present

## 2013-12-25 DIAGNOSIS — Y838 Other surgical procedures as the cause of abnormal reaction of the patient, or of later complication, without mention of misadventure at the time of the procedure: Secondary | ICD-10-CM | POA: Insufficient documentation

## 2013-12-25 DIAGNOSIS — Z79899 Other long term (current) drug therapy: Secondary | ICD-10-CM | POA: Insufficient documentation

## 2013-12-25 DIAGNOSIS — Z792 Long term (current) use of antibiotics: Secondary | ICD-10-CM | POA: Insufficient documentation

## 2013-12-25 DIAGNOSIS — E785 Hyperlipidemia, unspecified: Secondary | ICD-10-CM | POA: Insufficient documentation

## 2013-12-25 DIAGNOSIS — Z85819 Personal history of malignant neoplasm of unspecified site of lip, oral cavity, and pharynx: Secondary | ICD-10-CM

## 2013-12-25 DIAGNOSIS — T798XXA Other early complications of trauma, initial encounter: Secondary | ICD-10-CM

## 2013-12-25 DIAGNOSIS — Z87891 Personal history of nicotine dependence: Secondary | ICD-10-CM | POA: Diagnosis not present

## 2013-12-25 DIAGNOSIS — I10 Essential (primary) hypertension: Secondary | ICD-10-CM | POA: Diagnosis not present

## 2013-12-25 DIAGNOSIS — IMO0001 Reserved for inherently not codable concepts without codable children: Secondary | ICD-10-CM

## 2013-12-25 LAB — CBC WITH DIFFERENTIAL/PLATELET
BASOS PCT: 0 % (ref 0–1)
Basophils Absolute: 0 10*3/uL (ref 0.0–0.1)
Eosinophils Absolute: 0.1 10*3/uL (ref 0.0–0.7)
Eosinophils Relative: 2 % (ref 0–5)
HCT: 32.8 % — ABNORMAL LOW (ref 39.0–52.0)
Hemoglobin: 10.8 g/dL — ABNORMAL LOW (ref 13.0–17.0)
Lymphocytes Relative: 44 % (ref 12–46)
Lymphs Abs: 2.2 10*3/uL (ref 0.7–4.0)
MCH: 26.7 pg (ref 26.0–34.0)
MCHC: 32.9 g/dL (ref 30.0–36.0)
MCV: 81 fL (ref 78.0–100.0)
Monocytes Absolute: 0.8 10*3/uL (ref 0.1–1.0)
Monocytes Relative: 17 % — ABNORMAL HIGH (ref 3–12)
NEUTROS PCT: 37 % — AB (ref 43–77)
Neutro Abs: 1.8 10*3/uL (ref 1.7–7.7)
PLATELETS: 305 10*3/uL (ref 150–400)
RBC: 4.05 MIL/uL — AB (ref 4.22–5.81)
RDW: 15.8 % — ABNORMAL HIGH (ref 11.5–15.5)
WBC: 4.9 10*3/uL (ref 4.0–10.5)

## 2013-12-25 LAB — BASIC METABOLIC PANEL
Anion gap: 13 (ref 5–15)
BUN: 14 mg/dL (ref 6–23)
CALCIUM: 9.5 mg/dL (ref 8.4–10.5)
CO2: 22 mEq/L (ref 19–32)
Chloride: 104 mEq/L (ref 96–112)
Creatinine, Ser: 0.92 mg/dL (ref 0.50–1.35)
GFR, EST NON AFRICAN AMERICAN: 89 mL/min — AB (ref >90)
GLUCOSE: 92 mg/dL (ref 70–99)
POTASSIUM: 3.5 meq/L — AB (ref 3.7–5.3)
SODIUM: 139 meq/L (ref 137–147)

## 2013-12-25 LAB — RAPID STREP SCREEN (MED CTR MEBANE ONLY): Streptococcus, Group A Screen (Direct): NEGATIVE

## 2013-12-25 MED ORDER — SODIUM CHLORIDE 0.9 % IV BOLUS (SEPSIS)
1000.0000 mL | Freq: Once | INTRAVENOUS | Status: AC
  Administered 2013-12-25: 1000 mL via INTRAVENOUS

## 2013-12-25 MED ORDER — LISINOPRIL 10 MG PO TABS
10.0000 mg | ORAL_TABLET | Freq: Once | ORAL | Status: AC
  Administered 2013-12-25: 10 mg via ORAL
  Filled 2013-12-25: qty 1

## 2013-12-25 MED ORDER — HYDRALAZINE HCL 25 MG PO TABS
25.0000 mg | ORAL_TABLET | Freq: Once | ORAL | Status: AC
  Administered 2013-12-25: 25 mg via ORAL
  Filled 2013-12-25: qty 1

## 2013-12-25 MED ORDER — SULFAMETHOXAZOLE-TRIMETHOPRIM 800-160 MG PO TABS: 1.0000 | ORAL_TABLET | Freq: Two times a day (BID) | ORAL | Status: AC

## 2013-12-25 MED ORDER — SULFAMETHOXAZOLE-TRIMETHOPRIM 800-160 MG PO TABS: 1.0000 | ORAL_TABLET | Freq: Two times a day (BID) | ORAL | Status: DC

## 2013-12-25 MED ORDER — IOHEXOL 300 MG/ML  SOLN
100.0000 mL | Freq: Once | INTRAMUSCULAR | Status: AC | PRN
  Administered 2013-12-25: 100 mL via INTRAVENOUS

## 2013-12-25 MED ORDER — BISOPROLOL FUMARATE 10 MG PO TABS
10.0000 mg | ORAL_TABLET | Freq: Once | ORAL | Status: AC
  Administered 2013-12-25: 10 mg via ORAL
  Filled 2013-12-25: qty 1

## 2013-12-25 NOTE — ED Notes (Signed)
Patient transported to CT 

## 2013-12-25 NOTE — ED Notes (Signed)
Pt takes BP medication at 0900. Has not taken it yet this morning.

## 2013-12-25 NOTE — ED Notes (Signed)
Pt has a trach.  States his throat has been sore x 6 days.  Pt breathing easily through trach.  NAD.  Pt hypertensive in triage.

## 2013-12-25 NOTE — Discharge Instructions (Signed)
Call your Ear Nose Throat specialist at [facility] for further evaluation of your throat discomfort. Call for a follow up appointment with a Family or Primary Care Provider.  Return if Symptoms worsen.   Take medication as prescribed.  Salt water gargle 3-4 times a day. Apply warm compress to left elbow to allow to drain.

## 2013-12-25 NOTE — ED Notes (Signed)
PA at bedside.

## 2013-12-25 NOTE — ED Provider Notes (Signed)
CSN: [id number]     Arrival date & time 12/25/13  8250 History   First MD Initiated Contact with Patient 12/25/13 0825     No chief complaint on file.    (Consider location/radiation/quality/duration/timing/severity/associated sxs/prior Treatment) HPI Comments: The patient is a 63 year old male with past medical history hypertension, laryngeal cancer, present emergency room and chief complaint of left-sided throat discomfort for 7 days. Patient describes discomfort as left-sided burning, worsened with swallowing. Patient denies fever, ear pain, nasal congestion. Reports cough.  Patien with healing wound to left elbow, sutures placed 10/4 denies pain at site. Denies decrease in range of motion, drainage, fever. ENT specialist [facility]  The history is provided by the patient. No language interpreter was used.    Past Medical History  Diagnosis Date  . Hypertension   . Laryngeal cancer 2011    Laryngectomy, T3N0M0  . Cirrhosis of liver   . Hypothyroidism   . Alcohol abuse   . CHF (congestive heart failure)     EF 45-50% 05/2012  . Hyperlipidemia   . Arthritis    Past Surgical History  Procedure Laterality Date  . Tracheal surgery    . Tracheostomy     No family history on file. History  Substance Use Topics  . Smoking status: Former Smoker -- 0.20 packs/day for 50 years    Types: Cigarettes  . Smokeless tobacco: Never Used  . Alcohol Use: Yes     Comment: "rarely"    Review of Systems  Constitutional: Negative for fever and chills.  HENT: Positive for sore throat. Negative for congestion, ear pain and rhinorrhea.   Respiratory: Negative for cough.   Skin: Positive for wound. Negative for color change.      Allergies  Vicodin  Home Medications   Prior to Admission medications   Medication Sig Start Date End Date Taking? Authorizing Provider  acetaminophen (TYLENOL) 500 MG tablet Take 500 mg by mouth every 3 (three) hours as needed (pain). Not to exceed 3000mg  /24  hrs    Historical Provider, MD  aspirin EC 81 MG EC tablet Take 1 tablet (81 mg total) by mouth daily. 05/14/13   Leone Brand, MD  atorvastatin (LIPITOR) 20 MG tablet Take 20 mg by mouth daily.    Historical Provider, MD  bisoprolol (ZEBETA) 10 MG tablet Take 1 tablet (10 mg total) by mouth daily. 05/14/13   Leone Brand, MD  guaiFENesin (MUCINEX) 600 MG 12 hr tablet Take by mouth daily as needed (congestion).    Historical Provider, MD  hydrALAZINE (APRESOLINE) 25 MG tablet Take two times a day at lunch and after dinner 07/10/13   Renee A Kuneff, DO  levothyroxine (SYNTHROID, LEVOTHROID) 125 MCG tablet Take 2 tablets (250 mcg total) by mouth daily before breakfast. 05/14/13   Leone Brand, MD  lipase/protease/amylase (CREON-12/PANCREASE) 12000 UNITS CPEP capsule Take 1 capsule by mouth 3 (three) times daily before meals. 05/14/13   Leone Brand, MD  lisinopril (PRINIVIL,ZESTRIL) 10 MG tablet Take 1 tablet (10 mg total) by mouth daily. 06/10/13   Alveda Reasons, MD  megestrol (MEGACE) 400 MG/10ML suspension Take 400 mg by mouth daily.    Historical Provider, MD  mupirocin ointment (BACTROBAN) 2 % Apply 1 application topically daily.    Historical Provider, MD  ondansetron (ZOFRAN) 4 MG tablet Take 1 tablet (4 mg total) by mouth every 8 (eight) hours as needed for nausea or vomiting. 05/14/13   Leone Brand, MD  pantoprazole ([place]) 40  MG tablet Take 1 tablet (40 mg total) by mouth at bedtime. 05/14/13   Leone Brand, MD  traMADol (ULTRAM) 50 MG tablet Take 50 mg by mouth daily as needed (pain).    Historical Provider, MD   There were no vitals taken for this visit. Physical Exam  Nursing note and vitals reviewed. Constitutional: He is oriented to person, place, and time. He appears well-developed and well-nourished. He is cooperative. No distress.  HENT:  Head: Normocephalic and atraumatic.  Right Ear: Tympanic membrane normal. Tympanic membrane is not erythematous.  Left Ear: Tympanic  membrane normal. Tympanic membrane is not erythematous.  Mouth/Throat: Uvula is midline. No trismus in the jaw. Oropharyngeal exudate and posterior oropharyngeal erythema present.  Left sided, small increase in tissue adjacent to uvula. Minimal exudate.  Neck: Normal range of motion. Neck supple.    Left tonsillar area with hard tissue. Tracheostomy site, clean no erythremia.  Pulmonary/Chest: Effort normal. No respiratory distress.  Musculoskeletal:  Left elbow healing wound 7 intact sutures, minimal erythema. Full active ROM. Purulent discharge from wound, after suture removal.  Lymphadenopathy:       Head (right side): No tonsillar adenopathy present.       Head (left side): Tonsillar adenopathy present.  Neurological: He is alert and oriented to person, place, and time.  Skin: Skin is warm and dry. He is not diaphoretic.  Psychiatric: He has a normal mood and affect. His behavior is normal.    ED Course  SUTURE REMOVAL Date/Time: 12/25/2013 8:51 AM Performed by: Harvie Heck Authorized by: Harvie Heck Consent: Verbal consent obtained. Risks and benefits: risks, benefits and alternatives were discussed Consent given by: patient Patient understanding: patient states understanding of the procedure being performed Patient consent: the patient's understanding of the procedure matches consent given Procedure consent: procedure consent matches procedure scheduled Required items: required blood products, implants, devices, and special equipment available Patient identity confirmed: verbally with patient and arm band Time out: Immediately prior to procedure a "time out" was called to verify the correct patient, procedure, equipment, support staff and site/side marked as required. Body area: upper extremity Location details: left elbow Wound Appearance: red, purulent and draining Sutures Removed: 7 Facility: sutures placed in this facility Patient tolerance: Patient tolerated the  procedure well with no immediate complications.   (including critical care time) Labs Review Labs Reviewed  BASIC METABOLIC PANEL - Abnormal; Notable for the following:    Potassium 3.5 (*)    GFR calc non Af Amer 89 (*)    All other components within normal limits  CBC WITH DIFFERENTIAL - Abnormal; Notable for the following:    RBC 4.05 (*)    Hemoglobin 10.8 (*)    HCT 32.8 (*)    RDW 15.8 (*)    Neutrophils Relative % 37 (*)    Monocytes Relative 17 (*)    All other components within normal limits  RAPID STREP SCREEN  CULTURE, GROUP A STREP    Imaging Review Ct Soft Tissue Neck W Contrast  12/25/2013   CLINICAL DATA:  Sore throat. Tracheostomy. Hypertension. History of laryngeal carcinoma and laryngectomy.  EXAM: CT NECK WITH CONTRAST  TECHNIQUE: Multidetector CT imaging of the neck was performed using the standard protocol following the bolus administration of intravenous contrast.  CONTRAST:  149mL OMNIPAQUE IOHEXOL 300 MG/ML  SOLN  COMPARISON:  CT neck performed 05/12/2013 at [facility].  FINDINGS: Post treatment changes in the major and minor salivary glands are stable. Diffuse pharyngeal mucosal  space soft tissue edema and thickening is similar to priors, without definite recurrence. Unremarkable tracheostomy. COPD changes at the lung apices. Mild chronic medial pleural thickening on the RIGHT. Larynx is surgically absent. Scattered surgical clips in the neck. Both internal jugulars are patent. There is atheromatous change both carotid bifurcations. There is no visible retropharyngeal space edema. Parapharyngeal spaces appear symmetric/ unchanged from priors. The LEFT buccal space mass has been removed/treated compared with the scan from 05/19/2010. No acute osseous findings. Acute RIGHT maxillary sinusitis. Fluid layers also in the smaller RIGHT division of the sphenoid. Again noted is a subfrontal meningioma redemonstrated, 15 mm cross-section, similar to priors. Transverse  arch and non stenotic great vessel atherosclerotic changes. No pathologic adenopathy.  IMPRESSION: Stable post laryngectomy and post treatment changes of the neck status post tracheostomy. No findings worrisome for neck recurrence, abscess, or pathologic adenopathy.  Acute RIGHT maxillary and RIGHT division sphenoid sinus disease.   Electronically Signed   By: Rolla Flatten M.D.   On: 12/25/2013 12:48     EKG Interpretation None      MDM   Final diagnoses:  Sore throat  History of throat cancer  Wound infection, initial encounter  Elevated blood pressure   Patient presents with left-sided throat discomfort, history of throat cancer, negative CT 05/2013 is followed by ENT specialist at [facility]. Negative strep. Concern for recurrence of cancer CT soft tissue further evaluate. Patient also has right elbow wound, Sutures placed 10/4, removed during this encounter, purulent discharge noted after suture removal. Plan to discharge on antibiotic. Patient denies taking home antihypertensive medications prior to arrival, home medications ordered. Per EMR patient persistently elevated blood pressure on multiple readings. CT negative for abscess or cancer recurrence, plan to treat with saltwater gargles, plenty fluids and followup with ENT specialist. Discussed lab results, imaging results, and treatment plan with the patient and patient's family member. Return precautions given. Reports understanding and no other concerns at this time.  Patient is stable for discharge at this time. Meds given in ED:  Medications  sodium chloride 0.9 % bolus 1,000 mL (0 mLs Intravenous Stopped 12/25/13 1035)  hydrALAZINE (APRESOLINE) tablet 25 mg (25 mg Oral Given 12/25/13 0936)  lisinopril (PRINIVIL,ZESTRIL) tablet 10 mg (10 mg Oral Given 12/25/13 1116)  bisoprolol (ZEBETA) tablet 10 mg (10 mg Oral Given 12/25/13 1116)  iohexol (OMNIPAQUE) 300 MG/ML solution 100 mL (100 mLs Intravenous Contrast Given  12/25/13 1144)    Discharge Medication List as of 12/25/2013  1:21 PM    12/25/13 0000  sulfamethoxazole-trimethoprim (BACTRIM DS,SEPTRA DS) 800-160 MG per tablet 2 times daily      Harvie Heck, PA-C 12/25/13 1527

## 2013-12-26 LAB — CULTURE, GROUP A STREP

## 2013-12-26 NOTE — ED Provider Notes (Signed)
Medical screening examination/treatment/procedure(s) were performed by non-physician practitioner and as supervising physician I was immediately available for consultation/collaboration.   EKG Interpretation None       Virgel Manifold, MD 12/26/13 [phone]

## 2013-12-28 ENCOUNTER — Other Ambulatory Visit (HOSPITAL_COMMUNITY): Payer: Self-pay | Admitting: Cardiology

## 2013-12-28 ENCOUNTER — Other Ambulatory Visit: Payer: Self-pay | Admitting: Family Medicine

## 2014-01-01 ENCOUNTER — Encounter (HOSPITAL_COMMUNITY): Payer: Self-pay | Admitting: Cardiology

## 2014-01-13 ENCOUNTER — Other Ambulatory Visit: Payer: Self-pay | Admitting: *Deleted

## 2014-01-13 MED ORDER — ATORVASTATIN CALCIUM 20 MG PO TABS: 20.0000 mg | ORAL_TABLET | Freq: Every day | ORAL | Status: DC

## 2014-01-20 ENCOUNTER — Other Ambulatory Visit: Payer: Self-pay | Admitting: Family Medicine

## 2014-02-04 ENCOUNTER — Encounter (HOSPITAL_COMMUNITY): Payer: Self-pay | Admitting: Emergency Medicine

## 2014-02-04 ENCOUNTER — Emergency Department (HOSPITAL_COMMUNITY)
Admission: EM | Admit: 2014-02-04 | Discharge: 2014-02-04 | Disposition: A | Discharge status: Home / Self Care | Payer: Medicare Other | Attending: Emergency Medicine | Admitting: Emergency Medicine

## 2014-02-04 DIAGNOSIS — E039 Hypothyroidism, unspecified: Secondary | ICD-10-CM | POA: Diagnosis not present

## 2014-02-04 DIAGNOSIS — Z8719 Personal history of other diseases of the digestive system: Secondary | ICD-10-CM | POA: Diagnosis not present

## 2014-02-04 DIAGNOSIS — I10 Essential (primary) hypertension: Secondary | ICD-10-CM | POA: Insufficient documentation

## 2014-02-04 DIAGNOSIS — Z87891 Personal history of nicotine dependence: Secondary | ICD-10-CM | POA: Insufficient documentation

## 2014-02-04 DIAGNOSIS — C140 Malignant neoplasm of pharynx, unspecified: Secondary | ICD-10-CM | POA: Insufficient documentation

## 2014-02-04 DIAGNOSIS — J392 Other diseases of pharynx: Secondary | ICD-10-CM | POA: Diagnosis not present

## 2014-02-04 DIAGNOSIS — Z8521 Personal history of malignant neoplasm of larynx: Secondary | ICD-10-CM | POA: Diagnosis not present

## 2014-02-04 DIAGNOSIS — Z85819 Personal history of malignant neoplasm of unspecified site of lip, oral cavity, and pharynx: Secondary | ICD-10-CM

## 2014-02-04 DIAGNOSIS — R221 Localized swelling, mass and lump, neck: Secondary | ICD-10-CM | POA: Diagnosis not present

## 2014-02-04 DIAGNOSIS — Z8739 Personal history of other diseases of the musculoskeletal system and connective tissue: Secondary | ICD-10-CM | POA: Insufficient documentation

## 2014-02-04 DIAGNOSIS — N888 Other specified noninflammatory disorders of cervix uteri: Secondary | ICD-10-CM

## 2014-02-04 DIAGNOSIS — E785 Hyperlipidemia, unspecified: Secondary | ICD-10-CM | POA: Insufficient documentation

## 2014-02-04 DIAGNOSIS — Z79818 Long term (current) use of other agents affecting estrogen receptors and estrogen levels: Secondary | ICD-10-CM | POA: Diagnosis not present

## 2014-02-04 DIAGNOSIS — Z792 Long term (current) use of antibiotics: Secondary | ICD-10-CM | POA: Insufficient documentation

## 2014-02-04 DIAGNOSIS — Z79899 Other long term (current) drug therapy: Secondary | ICD-10-CM | POA: Insufficient documentation

## 2014-02-04 DIAGNOSIS — J029 Acute pharyngitis, unspecified: Secondary | ICD-10-CM | POA: Diagnosis present

## 2014-02-04 DIAGNOSIS — I509 Heart failure, unspecified: Secondary | ICD-10-CM | POA: Diagnosis not present

## 2014-02-04 MED ORDER — TRAMADOL HCL 50 MG PO TABS: 100.0000 mg | ORAL_TABLET | Freq: Four times a day (QID) | ORAL | Status: DC | PRN

## 2014-02-04 NOTE — Discharge Instructions (Signed)
Cancer of the Larynx Cancer of the voice box (larynx) is sometimes called throat cancer. Most of the time it affects the vocal cords. These are smooth, fiberlike cords. They stretch across the opening of the larynx. Cancer occurs when cells in the larynx become abnormal and start to grow out of control. It usually starts in the very thin, flat cells (squamous cells) on the outside of the larynx. Cancer cells can spread and form a mass of cells called a tumor. The tumor may spread deeper into the larynx, or it may spread to other areas of the body (metastasize). RISK FACTORS The exact cause of cancer of the larynx is not known. However, some risk factors make this more likely:  Use of cigarettes, cigars, pipes, smokeless (chewing) tobacco, and snuff. This is the biggest risk factor of cancer of the larynx.  Male gender.  Age of 64 years or older.  Frequent use of alcohol.  Human papillomavirus (HPV) infection.  Reflux esophagitis. Reflux esophagitis is the inflammation of the esophagus caused by regular backup of acid from the stomach into the esophagus. SYMPTOMS  A change in your voice. It may sound muffled or hoarse.  A lump on your neck.  A sore throat that does not go away.  Difficulty swallowing. It might hurt to swallow.  Ear pain.  Difficulty breathing, especially when doing exercise. Your breathing may be noisy (stridor), especially when you breathe out or breathe in very strongly. DIAGNOSIS To diagnose cancer of the larynx, your caregiver may perform the following exams:  A physical exam of your larynx. Your caregiver may use a mirror with a long handle or a thin, flexible tube with a tiny light and camera at the end (fiberscope) to see the back of your throat or your larynx. Your caregiver may also check for a sore or lump in other areas of your mouth, throat, and neck.  Removal and exam of a small number of cells (biopsy) from your larynx or a lump in your neck. These  cells are checked for cancerous formations under a microscope.  Imaging exams, such as X-rays of your larynx and neck. The images can show if there is an abnormal mass. If you do have cancer, your caregiver will stage your cancer. Staging provides an idea of how advanced your cancer is. The stage of your cancer will depend on how much your cancer has grown and if it has metastasized. The meaning of the stage depends on the type of cancer. For cancer of the larynx:  Stage I means the cancer is growing only on a vocal cord. It has not metastasized.  Stage II means the cancer has spread just above or below the vocal cord. The vocal cord might not be able to move like it should.  Stage III means the cancer is affecting movement of the vocal cords. It may have spread to a lymph node or lymph gland on the same side of your neck as the cancer. (Lymph is a fluid that carries white blood cells all over your body. White blood cells fight infection.)  Stage IV means the cancer has spread to nearby areas. It may have spread heavily into your lymph glands. TREATMENT Treatment for cancer of the larynx can vary. It will depend on the stage of your cancer and your overall health. Treatment options may include:  Radiation therapy. This uses waves of nuclear energy to kill cancer cells on your larynx. It may be used for stage I and stage  II cancers.  Surgery to remove the cancer cells. If you have stage III or stage IV cancer, surgery to remove your voice box (laryngectomy) may be necessary. If this is done, you may need to learn to use a device to speak again. You also may have a permanent hole (laryngectomy stoma) in your neck for breathing.  A combination of surgery, radiation, and drugs that kill cancer cells (chemotherapy). This may be used for stage III and stage IV cancers.  Additional surgery to make it easier for you to swallow and talk. This may be needed if the cancer has spread to the lymph nodes or  other parts of the neck. SEEK MEDICAL CARE IF:  You have trouble swallowing, or you lose weight without trying.  Your voice changes in any way, or you have difficulty speaking. SEEK IMMEDIATE MEDICAL CARE IF:   You start having pain in your throat or neck.  You have bleeding in your throat or mouth.  Your throat or neck swells.  You have difficulty swallowing.  You have difficulty breathing.  You have a fever. Document Released: 06/07/2010 Document Revised: 05/15/2011 Document Reviewed: 06/07/2010 [facility] Patient Information 2015 [place], [place]. This information is not intended to replace advice given to you by your health care provider. Make sure you discuss any questions you have with your health care provider.

## 2014-02-04 NOTE — ED Provider Notes (Signed)
CSN: [id number]     Arrival date & time 02/04/14  1555 History   First MD Initiated Contact with Patient 02/04/14 1620     Chief Complaint  Patient presents with  . Sore Throat     (Consider location/radiation/quality/duration/timing/severity/associated sxs/prior Treatment) HPI  Hard to swallow. Sore throat for 3 weeks. Slightly improved, now worse again. Has appointment with ENT end of December. Reports ENT told to get a prescription for a cream he can rub on his throat. Was being seen at [facility] and also a [facility] ENT doctor for 2 months.No fever. Ibuprofen 800mg  works for pain.  Past Medical History  Diagnosis Date  . Hypertension   . Cirrhosis of liver   . Hypothyroidism   . Alcohol abuse   . CHF (congestive heart failure)     EF 45-50% 05/2012  . Hyperlipidemia   . Arthritis   . Laryngeal cancer 2011    Laryngectomy, T3N0M0   Past Surgical History  Procedure Laterality Date  . Tracheal surgery    . Tracheostomy     No family history on file. History  Substance Use Topics  . Smoking status: Former Smoker -- 0.20 packs/day for 50 years    Types: Cigarettes  . Smokeless tobacco: Never Used  . Alcohol Use: Yes     Comment: "rarely"    Review of Systems 02/04/2014  Constitutional:No fever Respiratory:No cough, no SOB   Allergies  Vicodin and Tylenol  Home Medications   Prior to Admission medications   Medication Sig Start Date End Date Taking? Authorizing Provider  amLODipine (NORVASC) 5 MG tablet Take 1 tablet (5 mg total) by mouth daily. NEEDS OFFICE VISIT 01/01/14  Yes Jolaine Artist, MD  atorvastatin (LIPITOR) 20 MG tablet Take 1 tablet (20 mg total) by mouth daily. 01/13/14  Yes Renee A Kuneff, DO  bisoprolol (ZEBETA) 10 MG tablet Take 1 tablet (10 mg total) by mouth daily. 05/14/13  Yes Leone Brand, MD  guaiFENesin (MUCINEX) 600 MG 12 hr tablet Take 600 mg by mouth daily as needed for cough or to loosen phlegm.    Yes Historical Provider, MD   hydrALAZINE (APRESOLINE) 25 MG tablet TAKE 2 BY MOUTH TWICE DAILY AT LUNCH AND AFTER DINNER 01/20/14  Yes Renee A Kuneff, DO  levothyroxine (SYNTHROID, LEVOTHROID) 125 MCG tablet Take 2 tablets (250 mcg total) by mouth daily before breakfast. 05/14/13  Yes Leone Brand, MD  lipase/protease/amylase (CREON-12/PANCREASE) 12000 UNITS CPEP capsule Take 1 capsule by mouth 3 (three) times daily before meals. 05/14/13  Yes Leone Brand, MD  lisinopril (PRINIVIL,ZESTRIL) 10 MG tablet Take 1 tablet (10 mg total) by mouth daily. 06/10/13  Yes Alveda Reasons, MD  megestrol (MEGACE) 40 MG/ML suspension TAKE 10 MLS BY MOUTH DAILY 01/20/14  Yes Renee A Kuneff, DO  mupirocin ointment (BACTROBAN) 2 % Apply 1 application topically daily.   Yes Historical Provider, MD  ondansetron (ZOFRAN) 4 MG tablet Take 1 tablet (4 mg total) by mouth every 8 (eight) hours as needed for nausea or vomiting. 05/14/13  Yes Leone Brand, MD  pantoprazole (PROTONIX) 40 MG tablet Take 1 tablet (40 mg total) by mouth at bedtime. 05/14/13  Yes Leone Brand, MD  traMADol (ULTRAM) 50 MG tablet Take 2 tablets (100 mg total) by mouth every 6 (six) hours as needed. 02/04/14   Charlesetta Shanks, MD   BP 151/93 mmHg  Pulse 82  Temp(Src) 98.4 F (36.9 C) (Oral)  Resp 18  SpO2 100%  Physical Exam  Constitutional: He is oriented to person, place, and time.  The patient is a slender nontoxic-appearing gentleman. He has no respiratory distress.  HENT:  Head: Normocephalic and atraumatic.  Nose: Nose normal.  The posterior oropharynx is widely patent. The tonsillar pillar region on the left does appear slightly lobular and irregular compared to the right. There is no exudate. There is no difficulty handling secretions.  Eyes: EOM are normal. Pupils are equal, round, and reactive to light.  Neck:  Palpation of the left cervical tonsillar region reveals a rather hard mass that seems to be about 2 cm. This is tender and non-mobile. The patient's  tracheostomy site is very clean and dry area and there is no general swelling of neck. The neck itself is thin with muscular and anatomical structures easily discernible. The patient can turn his head side to side without difficulty. There is no meningismus or trismus.  Cardiovascular: Normal rate, regular rhythm and normal heart sounds.   Pulmonary/Chest: Effort normal and breath sounds normal. No respiratory distress. He has no wheezes. He has no rales.  Neurological: He is alert and oriented to person, place, and time. No cranial nerve deficit.  The patient's voice is difficult for me to understand however he is having and oriented conversation explaining his history. Due to my difficulty understanding his voice his daughter is helping to describe the situation.  Skin: Skin is warm and dry.  Psychiatric: He has a normal mood and affect.    ED Course  Procedures (including critical care time) Labs Review Labs Reviewed - No data to display  Imaging Review No results found.   EKG Interpretation None      MDM   Final diagnoses:  Cervical mass  Pharyngeal pain  History of throat cancer   At this point findings appear stable. Patient has a history of throat cancer. A CT scan was done in the emergency department 10/22 which did not identify any acute masses or changes. The patient needs ENT follow-up for further diagnostic evaluation. At this point it appears that pain management is the most appropriate treatment. The patient reports good response to high dose ibuprofen however he is noted to be hypertensive and on several medications. At this time rather than use ibuprofen I will suggest we used tramadol for pain and occasional lower dose ibuprofen.    Charlesetta Shanks, MD 02/04/14 [phone]

## 2014-02-04 NOTE — ED Notes (Signed)
Pt has been having sore throat x 3 wks.  Pt w/ hx of throat cancer.  Pt able to maintain secretions.

## 2014-02-12 ENCOUNTER — Encounter (HOSPITAL_COMMUNITY): Payer: Self-pay | Admitting: Internal Medicine

## 2014-02-17 ENCOUNTER — Other Ambulatory Visit: Payer: Self-pay | Admitting: Family Medicine

## 2014-02-17 DIAGNOSIS — Z8521 Personal history of malignant neoplasm of larynx: Secondary | ICD-10-CM | POA: Diagnosis not present

## 2014-02-17 DIAGNOSIS — J320 Chronic maxillary sinusitis: Secondary | ICD-10-CM | POA: Diagnosis not present

## 2014-02-17 DIAGNOSIS — J322 Chronic ethmoidal sinusitis: Secondary | ICD-10-CM | POA: Diagnosis not present

## 2014-02-17 DIAGNOSIS — J323 Chronic sphenoidal sinusitis: Secondary | ICD-10-CM | POA: Diagnosis not present

## 2014-02-17 DIAGNOSIS — Z9002 Acquired absence of larynx: Secondary | ICD-10-CM | POA: Diagnosis not present

## 2014-02-17 DIAGNOSIS — J029 Acute pharyngitis, unspecified: Secondary | ICD-10-CM | POA: Diagnosis not present

## 2014-02-17 DIAGNOSIS — R1310 Dysphagia, unspecified: Secondary | ICD-10-CM | POA: Diagnosis not present

## 2014-03-12 ENCOUNTER — Other Ambulatory Visit: Payer: Self-pay | Admitting: Family Medicine

## 2014-03-22 ENCOUNTER — Other Ambulatory Visit: Payer: Self-pay | Admitting: Family Medicine

## 2014-04-02 ENCOUNTER — Other Ambulatory Visit: Payer: Self-pay | Admitting: *Deleted

## 2014-04-02 MED ORDER — HYDRALAZINE HCL 25 MG PO TABS: ORAL_TABLET | ORAL | Status: DC

## 2014-04-02 NOTE — Telephone Encounter (Signed)
Covering for Dr Raoul Pitch today. Please call and let patient know I will fill for one month but he is overdue for f/u with Dr Raoul Pitch and from review of her last note in May, it looks like he was supposed to also f/u in CHF clinic and should make an appt to do this.  Hilton Sinclair, MD

## 2014-04-08 ENCOUNTER — Emergency Department (HOSPITAL_COMMUNITY): Payer: Medicare Other

## 2014-04-08 ENCOUNTER — Inpatient Hospital Stay (HOSPITAL_COMMUNITY)
Admission: EM | Admit: 2014-04-08 | Discharge: 2014-04-14 | DRG: 146 | Disposition: A | Discharge status: Home Health | Payer: Medicare Other | Attending: Family Medicine | Admitting: Family Medicine

## 2014-04-08 ENCOUNTER — Encounter (HOSPITAL_COMMUNITY): Payer: Self-pay | Admitting: Emergency Medicine

## 2014-04-08 DIAGNOSIS — I10 Essential (primary) hypertension: Secondary | ICD-10-CM | POA: Diagnosis present

## 2014-04-08 DIAGNOSIS — F1020 Alcohol dependence, uncomplicated: Secondary | ICD-10-CM | POA: Diagnosis present

## 2014-04-08 DIAGNOSIS — G8929 Other chronic pain: Secondary | ICD-10-CM | POA: Diagnosis not present

## 2014-04-08 DIAGNOSIS — R404 Transient alteration of awareness: Secondary | ICD-10-CM | POA: Diagnosis not present

## 2014-04-08 DIAGNOSIS — E43 Unspecified severe protein-calorie malnutrition: Secondary | ICD-10-CM | POA: Diagnosis not present

## 2014-04-08 DIAGNOSIS — C140 Malignant neoplasm of pharynx, unspecified: Secondary | ICD-10-CM | POA: Diagnosis not present

## 2014-04-08 DIAGNOSIS — Z85819 Personal history of malignant neoplasm of unspecified site of lip, oral cavity, and pharynx: Secondary | ICD-10-CM

## 2014-04-08 DIAGNOSIS — E876 Hypokalemia: Secondary | ICD-10-CM | POA: Diagnosis not present

## 2014-04-08 DIAGNOSIS — K229 Disease of esophagus, unspecified: Secondary | ICD-10-CM | POA: Diagnosis not present

## 2014-04-08 DIAGNOSIS — E860 Dehydration: Secondary | ICD-10-CM | POA: Diagnosis present

## 2014-04-08 DIAGNOSIS — R630 Anorexia: Secondary | ICD-10-CM | POA: Diagnosis not present

## 2014-04-08 DIAGNOSIS — E41 Nutritional marasmus: Secondary | ICD-10-CM | POA: Diagnosis not present

## 2014-04-08 DIAGNOSIS — K7460 Unspecified cirrhosis of liver: Secondary | ICD-10-CM | POA: Diagnosis not present

## 2014-04-08 DIAGNOSIS — R1310 Dysphagia, unspecified: Secondary | ICD-10-CM | POA: Diagnosis not present

## 2014-04-08 DIAGNOSIS — R55 Syncope and collapse: Secondary | ICD-10-CM | POA: Diagnosis not present

## 2014-04-08 DIAGNOSIS — E785 Hyperlipidemia, unspecified: Secondary | ICD-10-CM | POA: Diagnosis present

## 2014-04-08 DIAGNOSIS — R531 Weakness: Secondary | ICD-10-CM | POA: Diagnosis not present

## 2014-04-08 DIAGNOSIS — E039 Hypothyroidism, unspecified: Secondary | ICD-10-CM | POA: Diagnosis not present

## 2014-04-08 DIAGNOSIS — J929 Pleural plaque without asbestos: Secondary | ICD-10-CM | POA: Diagnosis not present

## 2014-04-08 DIAGNOSIS — Z681 Body mass index (BMI) 19 or less, adult: Secondary | ICD-10-CM

## 2014-04-08 DIAGNOSIS — K2289 Other specified disease of esophagus: Secondary | ICD-10-CM | POA: Insufficient documentation

## 2014-04-08 DIAGNOSIS — J439 Emphysema, unspecified: Secondary | ICD-10-CM | POA: Diagnosis not present

## 2014-04-08 DIAGNOSIS — R64 Cachexia: Secondary | ICD-10-CM | POA: Diagnosis present

## 2014-04-08 DIAGNOSIS — D0008 Carcinoma in situ of pharynx: Principal | ICD-10-CM | POA: Diagnosis present

## 2014-04-08 DIAGNOSIS — E2740 Unspecified adrenocortical insufficiency: Secondary | ICD-10-CM | POA: Diagnosis present

## 2014-04-08 DIAGNOSIS — Z87891 Personal history of nicotine dependence: Secondary | ICD-10-CM | POA: Diagnosis not present

## 2014-04-08 DIAGNOSIS — I739 Peripheral vascular disease, unspecified: Secondary | ICD-10-CM | POA: Diagnosis not present

## 2014-04-08 DIAGNOSIS — Z8521 Personal history of malignant neoplasm of larynx: Secondary | ICD-10-CM

## 2014-04-08 DIAGNOSIS — J029 Acute pharyngitis, unspecified: Secondary | ICD-10-CM | POA: Diagnosis present

## 2014-04-08 DIAGNOSIS — R070 Pain in throat: Secondary | ICD-10-CM | POA: Diagnosis not present

## 2014-04-08 DIAGNOSIS — K861 Other chronic pancreatitis: Secondary | ICD-10-CM | POA: Diagnosis present

## 2014-04-08 DIAGNOSIS — N200 Calculus of kidney: Secondary | ICD-10-CM | POA: Diagnosis not present

## 2014-04-08 DIAGNOSIS — J392 Other diseases of pharynx: Secondary | ICD-10-CM | POA: Diagnosis not present

## 2014-04-08 DIAGNOSIS — Z515 Encounter for palliative care: Secondary | ICD-10-CM | POA: Diagnosis not present

## 2014-04-08 DIAGNOSIS — K228 Other specified diseases of esophagus: Secondary | ICD-10-CM | POA: Insufficient documentation

## 2014-04-08 DIAGNOSIS — R634 Abnormal weight loss: Secondary | ICD-10-CM | POA: Insufficient documentation

## 2014-04-08 DIAGNOSIS — R4020 Unspecified coma: Secondary | ICD-10-CM

## 2014-04-08 DIAGNOSIS — Z9002 Acquired absence of larynx: Secondary | ICD-10-CM | POA: Diagnosis not present

## 2014-04-08 LAB — CBC WITH DIFFERENTIAL/PLATELET
Basophils Absolute: 0 10*3/uL (ref 0.0–0.1)
Basophils Relative: 0 % (ref 0–1)
EOS ABS: 0.1 10*3/uL (ref 0.0–0.7)
Eosinophils Relative: 1 % (ref 0–5)
HEMATOCRIT: 36.4 % — AB (ref 39.0–52.0)
HEMOGLOBIN: 11.9 g/dL — AB (ref 13.0–17.0)
LYMPHS ABS: 1.4 10*3/uL (ref 0.7–4.0)
LYMPHS PCT: 29 % (ref 12–46)
MCH: 26.6 pg (ref 26.0–34.0)
MCHC: 32.7 g/dL (ref 30.0–36.0)
MCV: 81.4 fL (ref 78.0–100.0)
Monocytes Absolute: 0.6 10*3/uL (ref 0.1–1.0)
Monocytes Relative: 12 % (ref 3–12)
Neutro Abs: 2.7 10*3/uL (ref 1.7–7.7)
Neutrophils Relative %: 58 % (ref 43–77)
Platelets: 245 10*3/uL (ref 150–400)
RBC: 4.47 MIL/uL (ref 4.22–5.81)
RDW: 16.4 % — AB (ref 11.5–15.5)
WBC: 4.8 10*3/uL (ref 4.0–10.5)

## 2014-04-08 LAB — COMPREHENSIVE METABOLIC PANEL
ALT: 10 U/L (ref 0–53)
AST: 18 U/L (ref 0–37)
Albumin: 3.6 g/dL (ref 3.5–5.2)
Alkaline Phosphatase: 84 U/L (ref 39–117)
Anion gap: 16 — ABNORMAL HIGH (ref 5–15)
BILIRUBIN TOTAL: 1.5 mg/dL — AB (ref 0.3–1.2)
BUN: 21 mg/dL (ref 6–23)
CALCIUM: 9.7 mg/dL (ref 8.4–10.5)
CO2: 21 mmol/L (ref 19–32)
Chloride: 107 mmol/L (ref 96–112)
Creatinine, Ser: 1.07 mg/dL (ref 0.50–1.35)
GFR calc non Af Amer: 73 mL/min — ABNORMAL LOW (ref >90)
GFR, EST AFRICAN AMERICAN: 85 mL/min — AB (ref >90)
GLUCOSE: 80 mg/dL (ref 70–99)
Potassium: 3.4 mmol/L — ABNORMAL LOW (ref 3.5–5.1)
Sodium: 144 mmol/L (ref 135–145)
Total Protein: 8.2 g/dL (ref 6.0–8.3)

## 2014-04-08 LAB — TROPONIN I

## 2014-04-08 LAB — LIPASE, BLOOD: LIPASE: 18 U/L (ref 11–59)

## 2014-04-08 MED ORDER — SODIUM CHLORIDE 0.9 % IV BOLUS (SEPSIS)
1000.0000 mL | Freq: Once | INTRAVENOUS | Status: AC
  Administered 2014-04-08: 1000 mL via INTRAVENOUS

## 2014-04-08 MED ORDER — ONDANSETRON HCL 4 MG/2ML IJ SOLN
4.0000 mg | Freq: Once | INTRAMUSCULAR | Status: AC
  Administered 2014-04-08: 4 mg via INTRAVENOUS
  Filled 2014-04-08: qty 2

## 2014-04-08 MED ORDER — HYDROMORPHONE HCL 1 MG/ML IJ SOLN
0.5000 mg | Freq: Once | INTRAMUSCULAR | Status: AC
  Administered 2014-04-08: 0.5 mg via INTRAVENOUS
  Filled 2014-04-08: qty 1

## 2014-04-08 NOTE — ED Notes (Signed)
Bed: [facility] Expected date:  Expected time:  Means of arrival:  Comments: Near syncope

## 2014-04-08 NOTE — ED Provider Notes (Signed)
CSN: [id number]     Arrival date & time 04/08/14  1551 History   First MD Initiated Contact with Patient 04/08/14 1612     Chief Complaint  Patient presents with  . Anorexia     (Consider location/radiation/quality/duration/timing/severity/associated sxs/prior Treatment) Patient is a 64 y.o. male presenting with syncope. The history is provided by the patient. No language interpreter was used.  Loss of Consciousness Episode history:  Single Most recent episode:  Today Duration:  1 minute Progression:  Resolved Chronicity:  New Context: normal activity   Witnessed: yes   Relieved by:  Lying down Worsened by:  Nothing tried Ineffective treatments:  None tried Associated symptoms: nausea and weakness   Risk factors: no congenital heart disease and no seizures   Pt's family reports pt was at the bank today and passed out and had a seizure.  Family reports no history of seizures.   Pt has a history of throat ca.  Pt has skin stretching of neck and pain to area.  Family reports pt will no longer eat or drink.  Pt has a history of etoh abuse.  Pt has not drank alcohol in over a week.  No sign of withdrawal.  No history of alcohol related seizures.  Pt saw Ent and had normal exam.  Pt has chronic pain but has recently worsened.   Past Medical History  Diagnosis Date  . Hypertension   . Cirrhosis of liver   . Hypothyroidism   . Alcohol abuse   . CHF (congestive heart failure)     EF 45-50% 05/2012  . Hyperlipidemia   . Arthritis   . Laryngeal cancer 2011    Laryngectomy, [id number]   Past Surgical History  Procedure Laterality Date  . Tracheal surgery    . Tracheostomy    . Left and right heart catheterization with coronary angiogram N/A 12/28/2011    Procedure: LEFT AND RIGHT HEART CATHETERIZATION WITH CORONARY ANGIOGRAM;  Surgeon: Jolaine Artist, MD;  Location: [facility] CATH LAB;  Service: Cardiovascular;  Laterality: N/A;   No family history on file. History  Substance Use Topics   . Smoking status: Former Smoker -- 0.20 packs/day for 50 years    Types: Cigarettes  . Smokeless tobacco: Never Used  . Alcohol Use: Yes     Comment: "rarely"    Review of Systems  Cardiovascular: Positive for syncope.  Gastrointestinal: Positive for nausea. Negative for abdominal pain.  Neurological: Positive for weakness.  All other systems reviewed and are negative.     Allergies  Vicodin and Tylenol  Home Medications   Prior to Admission medications   Medication Sig Start Date End Date Taking? Authorizing Provider  bisoprolol (ZEBETA) 10 MG tablet TAKE 1 TABLET BY MOUTH EVERY DAY 03/23/14  Yes Renee A Kuneff, DO  diclofenac sodium (VOLTAREN) 1 % GEL Apply 2 g topically 4 (four) times daily.   Yes Historical Provider, MD  guaiFENesin (MUCINEX) 600 MG 12 hr tablet Take 600 mg by mouth daily as needed for cough or to loosen phlegm.    Yes Historical Provider, MD  hydrALAZINE (APRESOLINE) 25 MG tablet TAKE 2 BY MOUTH TWICE DAILY AT LUNCH AND AFTER DINNER. Follow up with PCP. Patient taking differently: Take 25 mg by mouth 2 (two) times daily. TWICE DAILY AT LUNCH AND AFTER DINNER. Follow up with PCP. 04/02/14  Yes Hilton Sinclair, MD  levothyroxine (SYNTHROID, LEVOTHROID) 125 MCG tablet Take 2 tablets (250 mcg total) by mouth daily before breakfast. 05/14/13  Yes Leone Brand, MD  lipase/protease/amylase (CREON-12/PANCREASE) 12000 UNITS CPEP capsule Take 1 capsule by mouth 3 (three) times daily before meals. 05/14/13  Yes Leone Brand, MD  lisinopril (PRINIVIL,ZESTRIL) 10 MG tablet Take 1 tablet (10 mg total) by mouth daily. 06/10/13  Yes Alveda Reasons, MD  megestrol (MEGACE) 40 MG/ML suspension TAKE 10 ML BY MOUTH DAILY 03/13/14  Yes Renee A Kuneff, DO  ondansetron (ZOFRAN) 4 MG tablet Take 1 tablet (4 mg total) by mouth every 8 (eight) hours as needed for nausea or vomiting. 05/14/13  Yes Leone Brand, MD  pantoprazole (PROTONIX) 40 MG tablet Take 1 tablet (40 mg total) by  mouth at bedtime. 05/14/13  Yes Leone Brand, MD  amLODipine (NORVASC) 5 MG tablet Take 1 tablet (5 mg total) by mouth daily. NEEDS OFFICE VISIT Patient not taking: Reported on 04/08/2014 01/01/14   Jolaine Artist, MD  atorvastatin (LIPITOR) 20 MG tablet Take 1 tablet (20 mg total) by mouth daily. Patient not taking: Reported on 04/08/2014 01/13/14   Renee A Kuneff, DO  traMADol (ULTRAM) 50 MG tablet Take 2 tablets (100 mg total) by mouth every 6 (six) hours as needed. Patient not taking: Reported on 04/08/2014 02/04/14   Charlesetta Shanks, MD   BP 183/103 mmHg  Pulse 84  Temp(Src) 98.3 F (36.8 C) (Oral)  Resp 16  SpO2 100% Physical Exam  Constitutional: He is oriented to person, place, and time. He appears well-developed.  Thin   HENT:  Head: Normocephalic and atraumatic.  Eyes: EOM are normal. Pupils are equal, round, and reactive to light.  Neck: Normal range of motion.  Cardiovascular: Normal rate and normal heart sounds.   Pulmonary/Chest: Effort normal.  Abdominal: Soft. He exhibits no distension.  Musculoskeletal: Normal range of motion.  Neurological: He is alert and oriented to person, place, and time.  Skin: Skin is warm.  Psychiatric: He has a normal mood and affect.  Nursing note and vitals reviewed.   ED Course  Procedures (including critical care time) Labs Review Labs Reviewed  CBC WITH DIFFERENTIAL/PLATELET - Abnormal; Notable for the following:    Hemoglobin 11.9 (*)    HCT 36.4 (*)    RDW 16.4 (*)    All other components within normal limits  COMPREHENSIVE METABOLIC PANEL - Abnormal; Notable for the following:    Potassium 3.4 (*)    Total Bilirubin 1.5 (*)    GFR calc non Af Amer 73 (*)    GFR calc Af Amer 85 (*)    Anion gap 16 (*)    All other components within normal limits  LIPASE, BLOOD  URINALYSIS, ROUTINE W REFLEX MICROSCOPIC    Imaging Review Dg Chest 2 View  04/08/2014   CLINICAL DATA:  Loss of appetite with altered level of consciousness,  syncopal episode and seizure-like activity. History of throat cancer. Initial encounter.  EXAM: CHEST  2 VIEW  COMPARISON:  Chest CT 05/12/2013.  Radiographs 05/08/2013.  FINDINGS: The heart size and mediastinal contours are stable. The lungs are clear. There is no pleural effusion or pneumothorax. Patient appears emaciated. No osseous abnormalities identified. There are surgical clips in the lower right neck.  IMPRESSION: No active cardiopulmonary process.   Electronically Signed   By: Camie Patience M.D.   On: 04/08/2014 17:13   Ct Head Wo Contrast  04/08/2014   CLINICAL DATA:  Acute loss of appetite. Altered level of consciousness. Syncope and seizure like activity. Initial encounter.  EXAM: CT HEAD  WITHOUT CONTRAST  TECHNIQUE: Contiguous axial images were obtained from the base of the skull through the vertex without intravenous contrast.  COMPARISON:  CT of the head performed 05/08/2013  FINDINGS: There is no evidence of acute infarction, mass lesion, or intra- or extra-axial hemorrhage on CT.  Prominence of the ventricles and sulci reflects mild to moderate cortical volume loss. Cerebellar atrophy is noted. Scattered periventricular and subcortical white matter change likely reflects small vessel ischemic microangiopathy.  The brainstem and fourth ventricle are within normal limits. The basal ganglia are unremarkable in appearance. The cerebral hemispheres demonstrate grossly normal gray-white differentiation. No mass effect or midline shift is seen.  There is no evidence of fracture; visualized osseous structures are unremarkable in appearance. The visualized portions of the orbits are within normal limits. There is mild partial opacification of the sphenoid sinus. The remaining paranasal sinuses and mastoid air cells are well-aerated. No significant soft tissue abnormalities are seen.  IMPRESSION: 1. No acute intracranial pathology seen on CT. 2. Mild to moderate cortical volume loss and scattered small  vessel ischemic microangiopathy. 3. Mild partial opacification of the sphenoid sinus.   Electronically Signed   By: Garald Balding M.D.   On: 04/08/2014 18:15     EKG Interpretation None    Ns heart rate of 90, lvh, no st elevation.   MDM  Ct head negative,  Pt refused po fluids,    Results for orders placed or performed during the hospital encounter of 04/08/14  CBC with Differential/Platelet  Result Value Ref Range   WBC 4.8 4.0 - 10.5 K/uL   RBC 4.47 4.22 - 5.81 MIL/uL   Hemoglobin 11.9 (L) 13.0 - 17.0 g/dL   HCT 36.4 (L) 39.0 - 52.0 %   MCV 81.4 78.0 - 100.0 fL   MCH 26.6 26.0 - 34.0 pg   MCHC 32.7 30.0 - 36.0 g/dL   RDW 16.4 (H) 11.5 - 15.5 %   Platelets 245 150 - 400 K/uL   Neutrophils Relative % 58 43 - 77 %   Neutro Abs 2.7 1.7 - 7.7 K/uL   Lymphocytes Relative 29 12 - 46 %   Lymphs Abs 1.4 0.7 - 4.0 K/uL   Monocytes Relative 12 3 - 12 %   Monocytes Absolute 0.6 0.1 - 1.0 K/uL   Eosinophils Relative 1 0 - 5 %   Eosinophils Absolute 0.1 0.0 - 0.7 K/uL   Basophils Relative 0 0 - 1 %   Basophils Absolute 0.0 0.0 - 0.1 K/uL  Comprehensive metabolic panel  Result Value Ref Range   Sodium 144 135 - 145 mmol/L   Potassium 3.4 (L) 3.5 - 5.1 mmol/L   Chloride 107 96 - 112 mmol/L   CO2 21 19 - 32 mmol/L   Glucose, Bld 80 70 - 99 mg/dL   BUN 21 6 - 23 mg/dL   Creatinine, Ser 1.07 0.50 - 1.35 mg/dL   Calcium 9.7 8.4 - 10.5 mg/dL   Total Protein 8.2 6.0 - 8.3 g/dL   Albumin 3.6 3.5 - 5.2 g/dL   AST 18 0 - 37 U/L   ALT 10 0 - 53 U/L   Alkaline Phosphatase 84 39 - 117 U/L   Total Bilirubin 1.5 (H) 0.3 - 1.2 mg/dL   GFR calc non Af Amer 73 (L) >90 mL/min   GFR calc Af Amer 85 (L) >90 mL/min   Anion gap 16 (H) 5 - 15  Lipase, blood  Result Value Ref Range   Lipase  18 11 - 59 U/L   Dg Chest 2 View  04/08/2014   CLINICAL DATA:  Loss of appetite with altered level of consciousness, syncopal episode and seizure-like activity. History of throat cancer. Initial encounter.   EXAM: CHEST  2 VIEW  COMPARISON:  Chest CT 05/12/2013.  Radiographs 05/08/2013.  FINDINGS: The heart size and mediastinal contours are stable. The lungs are clear. There is no pleural effusion or pneumothorax. Patient appears emaciated. No osseous abnormalities identified. There are surgical clips in the lower right neck.  IMPRESSION: No active cardiopulmonary process.   Electronically Signed   By: Camie Patience M.D.   On: 04/08/2014 17:13   Ct Head Wo Contrast  04/08/2014   CLINICAL DATA:  Acute loss of appetite. Altered level of consciousness. Syncope and seizure like activity. Initial encounter.  EXAM: CT HEAD WITHOUT CONTRAST  TECHNIQUE: Contiguous axial images were obtained from the base of the skull through the vertex without intravenous contrast.  COMPARISON:  CT of the head performed 05/08/2013  FINDINGS: There is no evidence of acute infarction, mass lesion, or intra- or extra-axial hemorrhage on CT.  Prominence of the ventricles and sulci reflects mild to moderate cortical volume loss. Cerebellar atrophy is noted. Scattered periventricular and subcortical white matter change likely reflects small vessel ischemic microangiopathy.  The brainstem and fourth ventricle are within normal limits. The basal ganglia are unremarkable in appearance. The cerebral hemispheres demonstrate grossly normal gray-white differentiation. No mass effect or midline shift is seen.  There is no evidence of fracture; visualized osseous structures are unremarkable in appearance. The visualized portions of the orbits are within normal limits. There is mild partial opacification of the sphenoid sinus. The remaining paranasal sinuses and mastoid air cells are well-aerated. No significant soft tissue abnormalities are seen.  IMPRESSION: 1. No acute intracranial pathology seen on CT. 2. Mild to moderate cortical volume loss and scattered small vessel ischemic microangiopathy. 3. Mild partial opacification of the sphenoid sinus.    Electronically Signed   By: Garald Balding M.D.   On: 04/08/2014 18:15    Final diagnoses:  Anorexia  Syncope, unspecified syncope type  Weakness    Rn attempted cath urine x 2. No urine first cath.  Pt had blood on second cath no urine.  We will continue to try to obtain urine.  Bladder scan 100cc urine in bladder. Iv fluids continued.     I spoke to Dr. Lieutenant [facility].   Pt to be admitted to family practice at [facility].    [place], PA-C 04/09/14 0001  Houston Siren III, MD 04/09/14 [phone]

## 2014-04-08 NOTE — ED Notes (Signed)
Pt is aware of urine sample, will provide when able, IV was just initiated

## 2014-04-08 NOTE — ED Notes (Signed)
Bladder scanner done, showed 169ml in bladder, 1 L NS bag hung

## 2014-04-08 NOTE — ED Notes (Addendum)
Attempted in and out x 2 met resistance blood clot noted in catheter. Notified PA, Magda Paganini.

## 2014-04-08 NOTE — ED Notes (Signed)
Pt transported to CT at present time. 

## 2014-04-08 NOTE — ED Notes (Signed)
Per nurse, charge nurse will get labs

## 2014-04-08 NOTE — ED Notes (Signed)
Unsuccessful IV attempt with [place] via [facility].

## 2014-04-08 NOTE — ED Notes (Signed)
PT IS ORTHOSTATIC.

## 2014-04-08 NOTE — ED Notes (Signed)
Pt states he is still unable to give urine sample 

## 2014-04-08 NOTE — ED Notes (Signed)
[place] ask Anderson Malta CN to attempt [place] IV. Anderson Malta unable to attempt at present time. IV CONSULT.

## 2014-04-08 NOTE — ED Notes (Signed)
Pt. Made aware for the need of urine. 

## 2014-04-08 NOTE — ED Notes (Signed)
Two unsuccessful IV attempts by this RN. [place] RN at bedside attempting IV insertion at present time.

## 2014-04-08 NOTE — ED Notes (Signed)
Per EMS pt complaint of loss of appetite; has not eat/drink for 5 days; was at bank with daughter altered LOC, syncopal, and seizure-like activity. Pt hx of throat cancer; has stoma; painful throat for a month.

## 2014-04-08 NOTE — ED Notes (Signed)
Pt currently in DG. Will draw labs AND administer bolus, zofran, and dilaudid with pt return.

## 2014-04-08 NOTE — ED Notes (Signed)
Pt was given water to drink, pt took a sip and said no, pt refusing to drink any more water states he hurts too bad.

## 2014-04-09 ENCOUNTER — Inpatient Hospital Stay (HOSPITAL_COMMUNITY): Payer: Medicare Other

## 2014-04-09 ENCOUNTER — Encounter (HOSPITAL_COMMUNITY): Payer: Self-pay

## 2014-04-09 DIAGNOSIS — E860 Dehydration: Secondary | ICD-10-CM

## 2014-04-09 DIAGNOSIS — R55 Syncope and collapse: Secondary | ICD-10-CM

## 2014-04-09 DIAGNOSIS — R404 Transient alteration of awareness: Secondary | ICD-10-CM

## 2014-04-09 DIAGNOSIS — R630 Anorexia: Secondary | ICD-10-CM | POA: Insufficient documentation

## 2014-04-09 DIAGNOSIS — R4020 Unspecified coma: Secondary | ICD-10-CM

## 2014-04-09 LAB — URINALYSIS, ROUTINE W REFLEX MICROSCOPIC
Glucose, UA: NEGATIVE mg/dL
KETONES UR: 40 mg/dL — AB
Nitrite: NEGATIVE
PROTEIN: 100 mg/dL — AB
Specific Gravity, Urine: 1.021 (ref 1.005–1.030)
UROBILINOGEN UA: 1 mg/dL (ref 0.0–1.0)
pH: 5.5 (ref 5.0–8.0)

## 2014-04-09 LAB — BASIC METABOLIC PANEL
ANION GAP: 10 (ref 5–15)
BUN: 13 mg/dL (ref 6–23)
CALCIUM: 8.9 mg/dL (ref 8.4–10.5)
CO2: 20 mmol/L (ref 19–32)
Chloride: 113 mmol/L — ABNORMAL HIGH (ref 96–112)
Creatinine, Ser: 1 mg/dL (ref 0.50–1.35)
GFR calc Af Amer: >90 mL/min (ref >90)
GFR, EST NON AFRICAN AMERICAN: 79 mL/min — AB (ref >90)
Glucose, Bld: 75 mg/dL (ref 70–99)
POTASSIUM: 3.1 mmol/L — AB (ref 3.5–5.1)
Sodium: 143 mmol/L (ref 135–145)

## 2014-04-09 LAB — CBC
HEMATOCRIT: 29.6 % — AB (ref 39.0–52.0)
Hemoglobin: 9.7 g/dL — ABNORMAL LOW (ref 13.0–17.0)
MCH: 25.9 pg — ABNORMAL LOW (ref 26.0–34.0)
MCHC: 32.8 g/dL (ref 30.0–36.0)
MCV: 78.9 fL (ref 78.0–100.0)
Platelets: 209 10*3/uL (ref 150–400)
RBC: 3.75 MIL/uL — ABNORMAL LOW (ref 4.22–5.81)
RDW: 16.3 % — ABNORMAL HIGH (ref 11.5–15.5)
WBC: 5.4 10*3/uL (ref 4.0–10.5)

## 2014-04-09 LAB — URINE MICROSCOPIC-ADD ON

## 2014-04-09 LAB — TROPONIN I
Troponin I: <0.03 ng/mL (ref <0.031)
Troponin I: <0.03 ng/mL (ref <0.031)

## 2014-04-09 LAB — RAPID URINE DRUG SCREEN, HOSP PERFORMED
Amphetamines: NOT DETECTED
BARBITURATES: NOT DETECTED
Benzodiazepines: NOT DETECTED
COCAINE: NOT DETECTED
Opiates: POSITIVE — AB
Tetrahydrocannabinol: NOT DETECTED

## 2014-04-09 LAB — RAPID STREP SCREEN (MED CTR MEBANE ONLY): Streptococcus, Group A Screen (Direct): NEGATIVE

## 2014-04-09 LAB — OCCULT BLOOD X 1 CARD TO LAB, STOOL: Fecal Occult Bld: NEGATIVE

## 2014-04-09 MED ORDER — FOLIC ACID 1 MG PO TABS
1.0000 mg | ORAL_TABLET | Freq: Every day | ORAL | Status: DC
  Administered 2014-04-09 – 2014-04-11 (×3): 1 mg via ORAL
  Filled 2014-04-09 (×4): qty 1

## 2014-04-09 MED ORDER — ENSURE COMPLETE PO LIQD
237.0000 mL | Freq: Three times a day (TID) | ORAL | Status: DC
  Administered 2014-04-09 – 2014-04-13 (×13): 237 mL via ORAL

## 2014-04-09 MED ORDER — IOHEXOL 300 MG/ML  SOLN
100.0000 mL | Freq: Once | INTRAMUSCULAR | Status: AC | PRN
  Administered 2014-04-09: 100 mL via INTRAVENOUS

## 2014-04-09 MED ORDER — SODIUM CHLORIDE 0.9 % IV SOLN: INTRAVENOUS | Status: DC

## 2014-04-09 MED ORDER — NYSTATIN 100000 UNIT/ML MT SUSP
5.0000 mL | Freq: Four times a day (QID) | OROMUCOSAL | Status: DC
  Administered 2014-04-09 – 2014-04-14 (×22): 500000 [IU] via ORAL
  Filled 2014-04-09 (×25): qty 5

## 2014-04-09 MED ORDER — MIDAZOLAM HCL 5 MG/ML IJ SOLN
INTRAMUSCULAR | Status: AC
  Filled 2014-04-09: qty 2

## 2014-04-09 MED ORDER — SODIUM CHLORIDE 0.9 % IV SOLN
INTRAVENOUS | Status: AC
  Administered 2014-04-09: 03:00:00 via INTRAVENOUS

## 2014-04-09 MED ORDER — LORAZEPAM 2 MG/ML IJ SOLN: 1.0000 mg | Freq: Four times a day (QID) | INTRAMUSCULAR | Status: DC | PRN

## 2014-04-09 MED ORDER — IOHEXOL 300 MG/ML  SOLN
25.0000 mL | INTRAMUSCULAR | Status: AC
  Administered 2014-04-09 (×2): 25 mL via ORAL

## 2014-04-09 MED ORDER — HEPARIN SODIUM (PORCINE) 5000 UNIT/ML IJ SOLN
5000.0000 [IU] | Freq: Three times a day (TID) | INTRAMUSCULAR | Status: DC
  Administered 2014-04-09 – 2014-04-14 (×14): 5000 [IU] via SUBCUTANEOUS
  Filled 2014-04-09 (×18): qty 1

## 2014-04-09 MED ORDER — ONDANSETRON HCL 4 MG PO TABS: 4.0000 mg | ORAL_TABLET | Freq: Three times a day (TID) | ORAL | Status: DC | PRN

## 2014-04-09 MED ORDER — LORAZEPAM 2 MG/ML IJ SOLN
0.0000 mg | Freq: Two times a day (BID) | INTRAMUSCULAR | Status: DC
Start: 2014-04-11 — End: 2014-04-12

## 2014-04-09 MED ORDER — HYDRALAZINE HCL 20 MG/ML IJ SOLN
5.0000 mg | Freq: Once | INTRAMUSCULAR | Status: DC
  Filled 2014-04-09: qty 1

## 2014-04-09 MED ORDER — SODIUM CHLORIDE 0.9 % IV SOLN
INTRAVENOUS | Status: DC
  Administered 2014-04-09: 20:00:00 via INTRAVENOUS
  Administered 2014-04-10: 500 mL via INTRAVENOUS

## 2014-04-09 MED ORDER — INFLUENZA VAC SPLIT QUAD 0.5 ML IM SUSY
0.5000 mL | PREFILLED_SYRINGE | INTRAMUSCULAR | Status: AC
  Administered 2014-04-12: 0.5 mL via INTRAMUSCULAR
  Filled 2014-04-09: qty 0.5

## 2014-04-09 MED ORDER — DIPHENHYDRAMINE HCL 50 MG/ML IJ SOLN
INTRAMUSCULAR | Status: AC
  Filled 2014-04-09: qty 1

## 2014-04-09 MED ORDER — VITAMIN B-1 100 MG PO TABS
100.0000 mg | ORAL_TABLET | Freq: Every day | ORAL | Status: DC
  Administered 2014-04-09 – 2014-04-11 (×3): 100 mg via ORAL
  Filled 2014-04-09 (×4): qty 1

## 2014-04-09 MED ORDER — LEVOTHYROXINE SODIUM 125 MCG PO TABS
250.0000 ug | ORAL_TABLET | Freq: Every day | ORAL | Status: DC
  Administered 2014-04-09 – 2014-04-10 (×2): 250 ug via ORAL
  Filled 2014-04-09 (×3): qty 2

## 2014-04-09 MED ORDER — POTASSIUM CHLORIDE CRYS ER 20 MEQ PO TBCR
40.0000 meq | EXTENDED_RELEASE_TABLET | Freq: Once | ORAL | Status: AC
  Administered 2014-04-09: 40 meq via ORAL
  Filled 2014-04-09: qty 2

## 2014-04-09 MED ORDER — BISOPROLOL FUMARATE 10 MG PO TABS
10.0000 mg | ORAL_TABLET | Freq: Every day | ORAL | Status: DC
  Administered 2014-04-09: 10 mg via ORAL
  Filled 2014-04-09 (×2): qty 1

## 2014-04-09 MED ORDER — LORAZEPAM 1 MG PO TABS: 1.0000 mg | ORAL_TABLET | Freq: Four times a day (QID) | ORAL | Status: DC | PRN

## 2014-04-09 MED ORDER — HYDRALAZINE HCL 20 MG/ML IJ SOLN
10.0000 mg | INTRAMUSCULAR | Status: DC | PRN
  Administered 2014-04-09 – 2014-04-10 (×2): 10 mg via INTRAVENOUS
  Filled 2014-04-09 (×2): qty 1

## 2014-04-09 MED ORDER — FENTANYL CITRATE 0.05 MG/ML IJ SOLN
INTRAMUSCULAR | Status: AC
Start: 2014-04-09 — End: 2014-04-09
  Filled 2014-04-09: qty 2

## 2014-04-09 MED ORDER — LORAZEPAM 2 MG/ML IJ SOLN
0.0000 mg | Freq: Four times a day (QID) | INTRAMUSCULAR | Status: AC
Start: 2014-04-09 — End: 2014-04-10
  Administered 2014-04-10: 2 mg via INTRAVENOUS
  Filled 2014-04-09: qty 2

## 2014-04-09 MED ORDER — SODIUM CHLORIDE 0.9 % IJ SOLN
3.0000 mL | Freq: Two times a day (BID) | INTRAMUSCULAR | Status: DC
Start: 2014-04-09 — End: 2014-04-14
  Administered 2014-04-09 – 2014-04-14 (×10): 3 mL via INTRAVENOUS

## 2014-04-09 MED ORDER — ADULT MULTIVITAMIN W/MINERALS CH
1.0000 | ORAL_TABLET | Freq: Every day | ORAL | Status: DC
  Administered 2014-04-09 – 2014-04-11 (×3): 1 via ORAL
  Filled 2014-04-09 (×4): qty 1

## 2014-04-09 MED ORDER — HYDRALAZINE HCL 20 MG/ML IJ SOLN
10.0000 mg | Freq: Once | INTRAMUSCULAR | Status: AC
  Administered 2014-04-09: 10 mg via INTRAVENOUS

## 2014-04-09 MED ORDER — PANCRELIPASE (LIP-PROT-AMYL) 12000-38000 UNITS PO CPEP
1.0000 | ORAL_CAPSULE | Freq: Three times a day (TID) | ORAL | Status: DC
  Administered 2014-04-09 – 2014-04-14 (×15): 12000 [IU] via ORAL
  Filled 2014-04-09 (×19): qty 1

## 2014-04-09 MED ORDER — MAGIC MOUTHWASH W/LIDOCAINE
5.0000 mL | Freq: Three times a day (TID) | ORAL | Status: DC | PRN
  Administered 2014-04-09 – 2014-04-11 (×3): 5 mL via ORAL
  Filled 2014-04-09 (×6): qty 5

## 2014-04-09 MED ORDER — BOOST / RESOURCE BREEZE PO LIQD
1.0000 | Freq: Three times a day (TID) | ORAL | Status: DC
Start: 2014-04-09 — End: 2014-04-09
  Administered 2014-04-09 (×2): 1 via ORAL

## 2014-04-09 MED ORDER — PANTOPRAZOLE SODIUM 40 MG PO TBEC
40.0000 mg | DELAYED_RELEASE_TABLET | Freq: Every day | ORAL | Status: DC
  Administered 2014-04-09 – 2014-04-14 (×5): 40 mg via ORAL
  Filled 2014-04-09 (×5): qty 1

## 2014-04-09 MED ORDER — THIAMINE HCL 100 MG/ML IJ SOLN
100.0000 mg | Freq: Every day | INTRAMUSCULAR | Status: DC
  Filled 2014-04-09 (×4): qty 1

## 2014-04-09 MED ORDER — LISINOPRIL 10 MG PO TABS
10.0000 mg | ORAL_TABLET | Freq: Every day | ORAL | Status: DC
  Administered 2014-04-09: 10 mg via ORAL
  Filled 2014-04-09 (×2): qty 1

## 2014-04-09 NOTE — ED Notes (Signed)
Daughter-[name] called and made aware pt will be transporting to [facility].

## 2014-04-09 NOTE — Anesthesia Preprocedure Evaluation (Addendum)
Anesthesia Evaluation  Patient identified by MRN, date of birth, ID band Patient awake    Reviewed: Allergy & Precautions, NPO status , [name] Chart, lab work & pertinent test results, reviewed documented beta blocker date and time   History of Anesthesia Complications Negative for: history of anesthetic complications  Airway Mallampati: II  TM Distance: >3 FB Neck ROM: Full   Comment: tracheostomy Dental  (+) Edentulous Upper, Edentulous Lower, Dental Advisory Given   Pulmonary former smoker (resent quit),  Tracheostomy dependent breath sounds clear to auscultation        Cardiovascular hypertension, Pt. on medications +CHF Rhythm:Regular  EF ECHO 05/2012 45-50%   Neuro/Psych syncope and possible seizure prompted admission. R carotid 39%, Left 59% 04/09/2014    GI/Hepatic negative GI ROS, Neg liver ROS, Need TO RO Esophageal stricture or ulcer, has had throat CA   Endo/Other  negative endocrine ROSHypothyroidism   Renal/GU      Musculoskeletal   Abdominal (+)  Abdomen: soft.    Peds  Hematology   Anesthesia Other Findings   Reproductive/Obstetrics                           Anesthesia Physical Anesthesia Plan  ASA: III  Anesthesia Plan: MAC   Post-op Pain Management:    Induction: Intravenous  Airway Management Planned: Nasal Cannula  Additional Equipment:   Intra-op Plan:   Post-operative Plan:   Informed Consent: I have reviewed the patients History and Physical, chart, labs and discussed the procedure including the risks, benefits and alternatives for the proposed anesthesia with the patient or authorized representative who has indicated his/her understanding and acceptance.     Plan Discussed with:   Anesthesia Plan Comments:        Anesthesia Quick Evaluation

## 2014-04-09 NOTE — Consult Note (Signed)
[facility] Consult Note  Referring Provider: No ref. provider found Primary Care Physician:  Howard Pouch, DO Primary Gastroenterologist:  Dr.  Laurel Dimmer Complaint: Odynophagia HPI: [name] is an 64 y.o. black male  presented with loss of consciousness and describes poor by mouth intake over the last 5 days. He's had a laryngectomy in 2009 cannot speak audibly but can nod and mouth words appropriately. He appears to describe odynophagia for solids than liquids approximately equal without any forced regurgitation he states his been going on intermittently for about 2 or 3 weeks only. He denies any hematemesis or nausea or vomiting. He has had a significant weight loss. He had a head CT scan that was normal and an abdominal CT scan that is pending.  Past Medical History  Diagnosis Date  . Hypertension   . Cirrhosis of liver   . Hypothyroidism   . Alcohol abuse   . CHF (congestive heart failure)     EF 45-50% 05/2012  . Hyperlipidemia   . Arthritis   . Laryngeal cancer 2011    Laryngectomy, [id number]    Past Surgical History  Procedure Laterality Date  . Tracheal surgery    . Tracheostomy    . Left and right heart catheterization with coronary angiogram N/A 12/28/2011    Procedure: LEFT AND RIGHT HEART CATHETERIZATION WITH CORONARY ANGIOGRAM;  Surgeon: Jolaine Artist, MD;  Location: [facility] CATH LAB;  Service: Cardiovascular;  Laterality: N/A;    Medications Prior to Admission  Medication Sig Dispense Refill  . bisoprolol (ZEBETA) 10 MG tablet TAKE 1 TABLET BY MOUTH EVERY DAY 30 tablet 0  . diclofenac sodium (VOLTAREN) 1 % GEL Apply 2 g topically 4 (four) times daily.    Marland Kitchen guaiFENesin (MUCINEX) 600 MG 12 hr tablet Take 600 mg by mouth daily as needed for cough or to loosen phlegm.     . hydrALAZINE (APRESOLINE) 25 MG tablet TAKE 2 BY MOUTH TWICE DAILY AT LUNCH AND AFTER DINNER. Follow up with PCP. (Patient taking differently: Take 25 mg by mouth 2 (two) times daily.  TWICE DAILY AT LUNCH AND AFTER DINNER. Follow up with PCP.) 60 tablet 0  . levothyroxine (SYNTHROID, LEVOTHROID) 125 MCG tablet Take 2 tablets (250 mcg total) by mouth daily before breakfast. 60 tablet 11  . lipase/protease/amylase (CREON-12/PANCREASE) 12000 UNITS CPEP capsule Take 1 capsule by mouth 3 (three) times daily before meals. 270 capsule 4  . lisinopril (PRINIVIL,ZESTRIL) 10 MG tablet Take 1 tablet (10 mg total) by mouth daily. 90 tablet 3  . megestrol (MEGACE) 40 MG/ML suspension TAKE 10 ML BY MOUTH DAILY 300 mL 11  . ondansetron (ZOFRAN) 4 MG tablet Take 1 tablet (4 mg total) by mouth every 8 (eight) hours as needed for nausea or vomiting. 60 tablet 0  . pantoprazole (PROTONIX) 40 MG tablet Take 1 tablet (40 mg total) by mouth at bedtime. 30 tablet 11  . amLODipine (NORVASC) 5 MG tablet Take 1 tablet (5 mg total) by mouth daily. NEEDS OFFICE VISIT (Patient not taking: Reported on 04/08/2014) 30 tablet 0  . atorvastatin (LIPITOR) 20 MG tablet Take 1 tablet (20 mg total) by mouth daily. (Patient not taking: Reported on 04/08/2014) 30 tablet 7  . traMADol (ULTRAM) 50 MG tablet Take 2 tablets (100 mg total) by mouth every 6 (six) hours as needed. (Patient not taking: Reported on 04/08/2014) 20 tablet 0    Allergies:  Allergies  Allergen Reactions  . Vicodin [Hydrocodone-Acetaminophen] Rash  . Tylenol [Acetaminophen]  HBP -Liver Sorosis    No family history on file.  Social History:  reports that he has quit smoking. His smoking use included Cigarettes. He has a 10 pack-year smoking history. He has never used smokeless tobacco. He reports that he drinks alcohol. He reports that he does not use illicit drugs.  Review of Systems: negative except as above   Blood pressure 154/93, pulse 95, temperature 98.2 F (36.8 C), temperature source Oral, resp. rate 18, weight 47.401 kg (104 lb 8 oz), SpO2 98 %. Head: Normocephalic, without obvious abnormality, atraumatic. No obvious oral  thrush Neck: no adenopathy, no carotid bruit, no JVD, supple, symmetrical, trachea midline and thyroid not enlarged, symmetric, no tenderness/mass/nodules Resp: clear to auscultation bilaterally Cardio: regular rate and rhythm, S1, S2 normal, no murmur, click, rub or gallop GI: Abdomen soft nondistended with normoactive bowel sounds. No hepatosplenomegaly mass or guarding Extremities: extremities normal, atraumatic, no cyanosis or edema  Results for orders placed or performed during the hospital encounter of 04/08/14 (from the past 48 hour(s))  CBC with Differential/Platelet     Status: Abnormal   Collection Time: 04/08/14  6:25 PM  Result Value Ref Range   WBC 4.8 4.0 - 10.5 K/uL   RBC 4.47 4.22 - 5.81 MIL/uL   Hemoglobin 11.9 (L) 13.0 - 17.0 g/dL   HCT 36.4 (L) 39.0 - 52.0 %   MCV 81.4 78.0 - 100.0 fL   MCH 26.6 26.0 - 34.0 pg   MCHC 32.7 30.0 - 36.0 g/dL   RDW 16.4 (H) 11.5 - 15.5 %   Platelets 245 150 - 400 K/uL   Neutrophils Relative % 58 43 - 77 %   Neutro Abs 2.7 1.7 - 7.7 K/uL   Lymphocytes Relative 29 12 - 46 %   Lymphs Abs 1.4 0.7 - 4.0 K/uL   Monocytes Relative 12 3 - 12 %   Monocytes Absolute 0.6 0.1 - 1.0 K/uL   Eosinophils Relative 1 0 - 5 %   Eosinophils Absolute 0.1 0.0 - 0.7 K/uL   Basophils Relative 0 0 - 1 %   Basophils Absolute 0.0 0.0 - 0.1 K/uL  Comprehensive metabolic panel     Status: Abnormal   Collection Time: 04/08/14  6:25 PM  Result Value Ref Range   Sodium 144 135 - 145 mmol/L   Potassium 3.4 (L) 3.5 - 5.1 mmol/L   Chloride 107 96 - 112 mmol/L   CO2 21 19 - 32 mmol/L   Glucose, Bld 80 70 - 99 mg/dL   BUN 21 6 - 23 mg/dL   Creatinine, Ser 1.07 0.50 - 1.35 mg/dL   Calcium 9.7 8.4 - 10.5 mg/dL   Total Protein 8.2 6.0 - 8.3 g/dL   Albumin 3.6 3.5 - 5.2 g/dL   AST 18 0 - 37 U/L   ALT 10 0 - 53 U/L   Alkaline Phosphatase 84 39 - 117 U/L   Total Bilirubin 1.5 (H) 0.3 - 1.2 mg/dL   GFR calc non Af Amer 73 (L) >90 mL/min   GFR calc Af Amer 85 (L)  >90 mL/min    Comment: (NOTE) The eGFR has been calculated using the CKD EPI equation. This calculation has not been validated in all clinical situations. eGFR's persistently <90 mL/min signify possible Chronic Kidney Disease.    Anion gap 16 (H) 5 - 15  Lipase, blood     Status: None   Collection Time: 04/08/14  6:25 PM  Result Value Ref Range   Lipase  18 11 - 59 U/L  Troponin I     Status: None   Collection Time: 04/08/14 10:07 PM  Result Value Ref Range   Troponin I <0.03 <0.031 ng/mL    Comment:        NO INDICATION OF MYOCARDIAL INJURY.   Urinalysis, Routine w reflex microscopic     Status: Abnormal   Collection Time: 04/09/14 12:18 AM  Result Value Ref Range   Color, Urine RED (A) YELLOW    Comment: BIOCHEMICALS MAY BE AFFECTED BY COLOR   APPearance TURBID (A) CLEAR   Specific Gravity, Urine 1.021 1.005 - 1.030   pH 5.5 5.0 - 8.0   Glucose, UA NEGATIVE NEGATIVE mg/dL   Hgb urine dipstick LARGE (A) NEGATIVE   Bilirubin Urine MODERATE (A) NEGATIVE   Ketones, ur 40 (A) NEGATIVE mg/dL   Protein, ur 100 (A) NEGATIVE mg/dL   Urobilinogen, UA 1.0 0.0 - 1.0 mg/dL   Nitrite NEGATIVE NEGATIVE   Leukocytes, UA TRACE (A) NEGATIVE  Urine microscopic-add on     Status: None   Collection Time: 04/09/14 12:18 AM  Result Value Ref Range   RBC / HPF TOO NUMEROUS TO COUNT <3 RBC/hpf   Urine-Other FIELD OBSCURED BY RBC'S     Comment: URINALYSIS PERFORMED ON SUPERNATANT  Rapid strep screen     Status: None   Collection Time: 04/09/14  3:26 AM  Result Value Ref Range   Streptococcus, Group A Screen (Direct) NEGATIVE NEGATIVE    Comment: (NOTE) A Rapid Antigen test may result negative if the antigen level in the sample is below the detection level of this test. The FDA has not cleared this test as a stand-alone test therefore the rapid antigen negative result has reflexed to a Group A Strep culture.   Basic metabolic panel     Status: Abnormal   Collection Time: 04/09/14  6:20 AM   Result Value Ref Range   Sodium 143 135 - 145 mmol/L   Potassium 3.1 (L) 3.5 - 5.1 mmol/L   Chloride 113 (H) 96 - 112 mmol/L   CO2 20 19 - 32 mmol/L   Glucose, Bld 75 70 - 99 mg/dL   BUN 13 6 - 23 mg/dL   Creatinine, Ser 1.00 0.50 - 1.35 mg/dL   Calcium 8.9 8.4 - 10.5 mg/dL   GFR calc non Af Amer 79 (L) >90 mL/min   GFR calc Af Amer >90 >90 mL/min    Comment: (NOTE) The eGFR has been calculated using the CKD EPI equation. This calculation has not been validated in all clinical situations. eGFR's persistently <90 mL/min signify possible Chronic Kidney Disease.    Anion gap 10 5 - 15  CBC     Status: Abnormal   Collection Time: 04/09/14  6:20 AM  Result Value Ref Range   WBC 5.4 4.0 - 10.5 K/uL   RBC 3.75 (L) 4.22 - 5.81 MIL/uL   Hemoglobin 9.7 (L) 13.0 - 17.0 g/dL   HCT 29.6 (L) 39.0 - 52.0 %   MCV 78.9 78.0 - 100.0 fL   MCH 25.9 (L) 26.0 - 34.0 pg   MCHC 32.8 30.0 - 36.0 g/dL   RDW 16.3 (H) 11.5 - 15.5 %   Platelets 209 150 - 400 K/uL  Troponin I (q 6hr x 3)     Status: None   Collection Time: 04/09/14  6:20 AM  Result Value Ref Range   Troponin I <0.03 <0.031 ng/mL    Comment:  NO INDICATION OF MYOCARDIAL INJURY.   Urine rapid drug screen (hosp performed)     Status: Abnormal   Collection Time: 04/09/14  6:50 AM  Result Value Ref Range   Opiates POSITIVE (A) NONE DETECTED   Cocaine NONE DETECTED NONE DETECTED   Benzodiazepines NONE DETECTED NONE DETECTED   Amphetamines NONE DETECTED NONE DETECTED   Tetrahydrocannabinol NONE DETECTED NONE DETECTED   Barbiturates NONE DETECTED NONE DETECTED    Comment:        DRUG SCREEN FOR MEDICAL PURPOSES ONLY.  IF CONFIRMATION IS NEEDED FOR ANY PURPOSE, NOTIFY LAB WITHIN 5 DAYS.        LOWEST DETECTABLE LIMITS FOR URINE DRUG SCREEN Drug Class       Cutoff (ng/mL) Amphetamine      1000 Barbiturate      200 Benzodiazepine   096 Tricyclics       045 Opiates          300 Cocaine          300 THC              50    Troponin I (q 6hr x 3)     Status: None   Collection Time: 04/09/14  8:22 AM  Result Value Ref Range   Troponin I <0.03 <0.031 ng/mL    Comment:        NO INDICATION OF MYOCARDIAL INJURY.    Dg Chest 2 View  04/08/2014   CLINICAL DATA:  Loss of appetite with altered level of consciousness, syncopal episode and seizure-like activity. History of throat cancer. Initial encounter.  EXAM: CHEST  2 VIEW  COMPARISON:  Chest CT 05/12/2013.  Radiographs 05/08/2013.  FINDINGS: The heart size and mediastinal contours are stable. The lungs are clear. There is no pleural effusion or pneumothorax. Patient appears emaciated. No osseous abnormalities identified. There are surgical clips in the lower right neck.  IMPRESSION: No active cardiopulmonary process.   Electronically Signed   By: Camie Patience M.D.   On: 04/08/2014 17:13   Ct Head Wo Contrast  04/08/2014   CLINICAL DATA:  Acute loss of appetite. Altered level of consciousness. Syncope and seizure like activity. Initial encounter.  EXAM: CT HEAD WITHOUT CONTRAST  TECHNIQUE: Contiguous axial images were obtained from the base of the skull through the vertex without intravenous contrast.  COMPARISON:  CT of the head performed 05/08/2013  FINDINGS: There is no evidence of acute infarction, mass lesion, or intra- or extra-axial hemorrhage on CT.  Prominence of the ventricles and sulci reflects mild to moderate cortical volume loss. Cerebellar atrophy is noted. Scattered periventricular and subcortical white matter change likely reflects small vessel ischemic microangiopathy.  The brainstem and fourth ventricle are within normal limits. The basal ganglia are unremarkable in appearance. The cerebral hemispheres demonstrate grossly normal gray-white differentiation. No mass effect or midline shift is seen.  There is no evidence of fracture; visualized osseous structures are unremarkable in appearance. The visualized portions of the orbits are within normal limits. There  is mild partial opacification of the sphenoid sinus. The remaining paranasal sinuses and mastoid air cells are well-aerated. No significant soft tissue abnormalities are seen.  IMPRESSION: 1. No acute intracranial pathology seen on CT. 2. Mild to moderate cortical volume loss and scattered small vessel ischemic microangiopathy. 3. Mild partial opacification of the sphenoid sinus.   Electronically Signed   By: Garald Balding M.D.   On: 04/08/2014 18:15    Assessment: Description of odynophagia for solids  and liquids without any definite esophageal obstruction per patient description. History of laryngeal cancer Weight loss Plan:  Await abdominal CT scan but tentatively have set up for endoscopy tomorrow to rule out esophageal ulcer candidal or other infectious esophagitis or malignancy. [id number],VHSJ C 04/09/2014, 10:31 AM

## 2014-04-09 NOTE — Progress Notes (Signed)
Family Medicine Teaching Service Daily Progress Note Intern Pager: [phone]  Patient name: [name] Medical record number: [id number] Date of birth: March 22, 1950 Age: 64 y.o. Gender: male  Primary Care Provider: Howard Pouch, DO Consultants: none Code Status: FULL (discussed on admission)  Pt Overview and Major Events to Date:    Assessment and Plan: [name] is a 64 y.o. male presenting with an acute LOC after poor PO intake over the last 5 days. PMH is significant for laryngeal cancer removed 2009, sCHF EF 45-50%, HTN, cirrhosis of the liver, chronic pancreatitis and ETOH dependence.   #Loss of Consciousness: Neuro exam unrevealing. CXR no acute processes. CT head no acute processes. CBC unremarkable.No h/o seizures. EKG no ischemic changes. DDx: hypoglycemia (poor PO intake) vs dehydration vs arrhythmia vs orthostasis vs new onset seizures (reports loss of urine) vs stroke (no neuro deficits on exam). OVS POSITIVE -telemetry -VS per floor protocol -2D echo ordered -carotid dopplers ordered -cycle troponins -repeat EKG  -on seizure precautions.  C/s to neuro if recurrence of seizure like activity  #Poor oral intake 2/2 throat pain: Exudate appreciated on exam. Strep (rapid negative) vs o/p candidiasis vs esophagitis.  Responded well to MMW w/ lido and tolerated BP meds overnight. -HIV and A1c ordered -Strep cx pending -Nystatin suspension QID, MMW with Lidocaine -gentle hydration w/ IVFs -Consider SLP evaluation -c/s to GI for evaluation (perhaps EGD?)  #HTN: uncontrolled BP on admission 198/106. On Hydralazine, bisoprolol an lisinopril at home. Toleated BP meds with MMW -Continue home Bisoprolol and Lisinopril.  Consider adding back Hydralazine PO. -IV Hydralazine until able to tolerate PO meds  #Weight loss: 23 lb weight loss since 07/2013. Appears very thin/malnourished on exam. Reports never having colonoscopy.  Concerning in setting of h/o laryngeal  cancer (recurrence vs mets vs new primary cancer) -HIV Ab -FOBT -Recommend outpatient/inpatient colonoscopy -Consider urology c/s in setting of prior images revealing asymmetric bladder wall thickening and h/o hematuria -CT soft tissue neck with contrast & CT scan chest, abdomen, pelvis ordered  #Hypokalemia: K3.4>3.1 -KDUR 78meq x1 dissolved in liquid after CT complete.  #Systolic CHF: last echo 5621, moderate LVH, EF 45-50%, w/ grade 1 diastolic dysfunction -Repeat echo  -Will be cautious with fluids  #h/o laryngeal cancer with resection 2009. Reportedly "cleared" by ENT however no records from [facility] since 05/2013 -Concern for weight loss over last year.  #Cirrhosis of liver: LFTs WNL. Liver not palpable on exam.  #ETOH dependence: Last reported drink 2 weeks ago. Usually drinks "1 pint" over 2 weeks. No evidence of withdrawal on admission -CIWA protocol -MVI, Thiamine -UDS  #Chronic pancreatitis: Creon at home. Lipase 18 -Continue home medication  #Hypothyroidism: Synthroid 250 mcg @ home -Continue home medication   #Hyperlipidemia: Lipitor 20mg  @ home -Continue home medication  FEN/GI: NS @75  cc/h, protonix, Clear liquid diet Prophylaxis: sub-q heparin  Disposition: Discharge pending improvement in PO and evaluation of LOC & weight loss work up  Subjective:  Patient states that his throat pain is slightly improved w/ magic mouthwash.  He was able to tolerate his PO BP meds last night.  Denies any n/v/discomfort elsewhere.  We discussed his discussion with Dr Lindell Noe this am.  Patient voices good understanding of tests being performed and reasons for them.  We also discussed that GI would be by to evaluate him for possible esophagitis.    Spoke to RN regarding planned tests.    Objective: Temp:  [97.5 F (36.4 C)-98.3 F (36.8 C)] 97.5 F (36.4  C) (02/04 0416) Pulse Rate:  [76-96] 96 (02/04 0416) Resp:  [16-26] 18 (02/04 0416) BP: (161-198)/(103-116) 198/106 mmHg  (02/04 0142) SpO2:  [99 %-100 %] 99 % (02/04 0416) Weight:  [104 lb 8 oz (47.401 kg)] 104 lb 8 oz (47.401 kg) (02/04 0142)  Orthostatic VS for the past 24 hrs:  BP- Lying Pulse- Lying BP- Sitting Pulse- Sitting BP- Standing at 0 minutes Pulse- Standing at 0 minutes  04/09/14 0416 (!) 184/103 mmHg 96 (!) 139/104 mmHg 114 142/89 mmHg 123  04/08/14 1612 (!) 167/105 mmHg 92 104/81 mmHg 90 100/73 mmHg 102   Standing @3mins  158/109, HR 132  Physical Exam: General: awake, alert, lying in bed, NAD, RN at bedside Cardiovascular: RRR, no m/r/g Respiratory: CTAB, no increased WOB, no wheeze Abdomen: flat, soft, NT/ND, +BS Extremities: thin, warm, no edema Neuro: follows commands, no focal neurological deficits, is not vocal but is able to nod to questions and mouth words.  Laboratory:  Recent Labs Lab 04/08/14 1825  WBC 4.8  HGB 11.9*  HCT 36.4*  PLT 245    Recent Labs Lab 04/08/14 1825  NA 144  K 3.4*  CL 107  CO2 21  BUN 21  CREATININE 1.07  CALCIUM 9.7  PROT 8.2  BILITOT 1.5*  ALKPHOS 84  ALT 10  AST 18  GLUCOSE 80    Urinalysis    Component Value Date/Time   COLORURINE RED* 04/09/2014 0018   APPEARANCEUR TURBID* 04/09/2014 0018   LABSPEC 1.021 04/09/2014 0018   PHURINE 5.5 04/09/2014 0018   GLUCOSEU NEGATIVE 04/09/2014 0018   HGBUR LARGE* 04/09/2014 0018   BILIRUBINUR MODERATE* 04/09/2014 0018   KETONESUR 40* 04/09/2014 0018   PROTEINUR 100* 04/09/2014 0018   UROBILINOGEN 1.0 04/09/2014 0018   NITRITE NEGATIVE 04/09/2014 0018   LEUKOCYTESUR TRACE* 04/09/2014 0018    Imaging/Diagnostic Tests: Dg Chest 2 View  04/08/2014   CLINICAL DATA:  Loss of appetite with altered level of consciousness, syncopal episode and seizure-like activity. History of throat cancer. Initial encounter.  EXAM: CHEST  2 VIEW  COMPARISON:  Chest CT 05/12/2013.  Radiographs 05/08/2013.  FINDINGS: The heart size and mediastinal contours are stable. The lungs are clear. There is no pleural  effusion or pneumothorax. Patient appears emaciated. No osseous abnormalities identified. There are surgical clips in the lower right neck.  IMPRESSION: No active cardiopulmonary process.   Electronically Signed   By: Camie Patience M.D.   On: 04/08/2014 17:13   Ct Head Wo Contrast  04/08/2014   CLINICAL DATA:  Acute loss of appetite. Altered level of consciousness. Syncope and seizure like activity. Initial encounter.  EXAM: CT HEAD WITHOUT CONTRAST  TECHNIQUE: Contiguous axial images were obtained from the base of the skull through the vertex without intravenous contrast.  COMPARISON:  CT of the head performed 05/08/2013  FINDINGS: There is no evidence of acute infarction, mass lesion, or intra- or extra-axial hemorrhage on CT.  Prominence of the ventricles and sulci reflects mild to moderate cortical volume loss. Cerebellar atrophy is noted. Scattered periventricular and subcortical white matter change likely reflects small vessel ischemic microangiopathy.  The brainstem and fourth ventricle are within normal limits. The basal ganglia are unremarkable in appearance. The cerebral hemispheres demonstrate grossly normal gray-white differentiation. No mass effect or midline shift is seen.  There is no evidence of fracture; visualized osseous structures are unremarkable in appearance. The visualized portions of the orbits are within normal limits. There is mild partial opacification of the sphenoid sinus. The  remaining paranasal sinuses and mastoid air cells are well-aerated. No significant soft tissue abnormalities are seen.  IMPRESSION: 1. No acute intracranial pathology seen on CT. 2. Mild to moderate cortical volume loss and scattered small vessel ischemic microangiopathy. 3. Mild partial opacification of the sphenoid sinus.   Electronically Signed   By: Garald Balding M.D.   On: 04/08/2014 18:15    Janora Norlander, DO 04/09/2014, 4:32 AM PGY-1, [place] Intern pager: [phone], text  pages welcome

## 2014-04-09 NOTE — Progress Notes (Signed)
VASCULAR LAB PRELIMINARY  PRELIMINARY  PRELIMINARY  PRELIMINARY  Carotid Dopplers completed.    Preliminary report:  1-39% right ICA stenosis.  40-59% left ICA stenosis.  Vertebral artery flow is antegrade.   Koven Belinsky, RVT 04/09/2014, 11:54 AM

## 2014-04-09 NOTE — Progress Notes (Signed)
INITIAL NUTRITION ASSESSMENT  Pt meets criteria for SEVERE MALNUTRITION in the context of chronic illness as evidenced by a 25% weight loss in 1 year and severe fat and muscle mass loss.  DOCUMENTATION CODES Per approved criteria  -Severe malnutrition in the context of chronic illness -Underweight   INTERVENTION: Chief Operating Officer.  Provide Ensure Complete po TID, each supplement provides 350 kcal and 13 grams of protein.  Encourage adequate po intake.  NUTRITION DIAGNOSIS: Malnutrition related to chronic illness as evidenced by severe fat and muscle mass loss.   Goal: Pt to meet >/= 90% of their estimated nutrition needs   Monitor:  PO intake, weight trends, labs, I/O's  Reason for Assessment: MST  64 y.o. male  Admitting Dx: LOC (loss of consciousness)  ASSESSMENT: Pt with an acute LOC after poor PO intake over the last 5 days. PMH is significant for laryngeal cancer removed 2009, sCHF EF 45-50%, HTN, cirrhosis of the liver, chronic pancreatitis and ETOH dependence.   Pt reports he is currently hungry. Pt with poor po intake over the past 5 day but reports he has been trying to eat at least 3 meals a day, however pt unable to quantify amount of food eaten. Pt reports he usualyl consumes Ensure at home at least 2 times daily. RD to order Ensure. Per Epic weight records, pt with a 25% weight loss in 1 year. Pt currently has [employer] ordered. RD to modify orders. Pt was encourage to eat his food at meals and to drink his supplements.  Nutrition Focused Physical Exam:  Subcutaneous Fat:  Orbital Region: N/A Upper Arm Region: Severe depletion Thoracic and Lumbar Region: Severe depletion  Muscle:  Temple Region: N/A Clavicle Bone Region: Severe depletion Clavicle and Acromion Bone Region: Severe depletion Scapular Bone Region: N/A Dorsal Hand: N/A Patellar Region: Severe depletion Anterior Thigh Region: Severe depletion depletion Posterior Calf Region:  Severe depletion  Edema: none  Labs: Low potassium and GFR. High chloride.  Height: Ht Readings from Last 1 Encounters:  07/10/13 5\' 11"  (1.803 m)    Weight: Wt Readings from Last 1 Encounters:  04/09/14 104 lb 8 oz (47.401 kg)    Ideal Body Weight: 172 lbs  % Ideal Body Weight: 60%  Wt Readings from Last 10 Encounters:  04/09/14 104 lb 8 oz (47.401 kg)  07/10/13 127 lb (57.607 kg)  06/10/13 121 lb (54.885 kg)  05/14/13 118 lb 13.3 oz (53.9 kg)  03/12/13 127 lb (57.607 kg)  02/07/13 134 lb (60.782 kg)  11/28/12 144 lb (65.318 kg)  03/28/12 140 lb 8 oz (63.73 kg)  01/18/12 128 lb (58.06 kg)  01/03/12 131 lb (59.421 kg)    Usual Body Weight: 127 lbs  % Usual Body Weight: 82%  BMI:  Body mass index is 14.58 kg/(m^2). Underweight  Estimated Nutritional Needs: Kcal: 1800-2000 Protein: 80-90 grams Fluid: 1.8 - 2 L/day  Skin: intact  Diet Order: Diet full liquid  EDUCATION NEEDS: -No education needs identified at this time   Intake/Output Summary (Last 24 hours) at 04/09/14 1533 Last data filed at 04/09/14 1445  Gross per 24 hour  Intake    320 ml  Output    250 ml  Net     70 ml    Last BM: 2/3  Labs:   Recent Labs Lab 04/08/14 1825 04/09/14 0620  NA 144 143  K 3.4* 3.1*  CL 107 113*  CO2 21 20  BUN 21 13  CREATININE 1.07 1.00  CALCIUM 9.7 8.9  GLUCOSE 80 75    CBG (last 3)  No results for input(s): GLUCAP in the last 72 hours.  Scheduled Meds: . bisoprolol  10 mg Oral Daily  . feeding supplement (RESOURCE BREEZE)  1 Container Oral TID BM  . folic acid  1 mg Oral Daily  . heparin  5,000 Units Subcutaneous 3 times per day  . [START ON 04/10/2014] Influenza vac split quadrivalent PF  0.5 mL Intramuscular Tomorrow-1000  . levothyroxine  250 mcg Oral QAC breakfast  . lipase/protease/amylase  1 capsule Oral TID AC  . lisinopril  10 mg Oral Daily  . LORazepam  0-4 mg Intravenous 4 times per day   Followed by  . [START ON 04/11/2014]  LORazepam  0-4 mg Intravenous Q12H  . multivitamin with minerals  1 tablet Oral Daily  . nystatin  5 mL Oral QID  . pantoprazole  40 mg Oral QHS  . sodium chloride  3 mL Intravenous Q12H  . thiamine  100 mg Oral Daily   Or  . thiamine  100 mg Intravenous Daily    Continuous Infusions:    Past Medical History  Diagnosis Date  . Hypertension   . Cirrhosis of liver   . Hypothyroidism   . Alcohol abuse   . CHF (congestive heart failure)     EF 45-50% 05/2012  . Hyperlipidemia   . Arthritis   . Laryngeal cancer 2011    Laryngectomy, T3N0M0    Past Surgical History  Procedure Laterality Date  . Tracheal surgery    . Tracheostomy    . Left and right heart catheterization with coronary angiogram N/A 12/28/2011    Procedure: LEFT AND RIGHT HEART CATHETERIZATION WITH CORONARY ANGIOGRAM;  Surgeon: Jolaine Artist, MD;  Location: [facility] CATH LAB;  Service: Cardiovascular;  Laterality: N/A;    Kallie Locks, MS, RD, LDN Pager # [phone] After hours/ weekend pager # [phone]

## 2014-04-09 NOTE — ED Provider Notes (Signed)
Medical screening examination/treatment/procedure(s) were conducted as a shared visit with non-physician practitioner(s) and myself.  I personally evaluated the patient during the encounter.   EKG Interpretation   Date/Time:  Wednesday April 08 2014 20:48:01 EST Ventricular Rate:  80 PR Interval:  179 QRS Duration: 77 QT Interval:  396 QTC Calculation: 457 R Axis:   25 Text Interpretation:  Sinus rhythm Probable left atrial enlargement  Abnormal R-wave progression, early transition Probable left ventricular  hypertrophy compared to prior, nonspecific ST/T abnormality not as  prominent Confirmed by [facility]  MD, TREY ([id number]) on 04/09/2014 5:13:44 PM      64 yo male with history of throat cancer who presents after an episode of loss of consciousness while at the bank with his daughter.  Family notes that he had generalized convulsions prior to collapsing to the floor unconscious.  On exam, chronic ill appearance, cachectic, nontoxic, not distressed, normal respiratory effort, normal perfusion.  Labs unrevealing.  Plan admit.   Clinical Impression: 1. Syncope, unspecified syncope type   2. Anorexia   3. Weakness   4. Weight loss       Houston Siren III, MD 04/09/14 [phone]

## 2014-04-09 NOTE — H&P (Signed)
[facility] Admission History and Physical Service Pager: [phone]  Patient name: [name] Medical record number: [id number] Date of birth: 1950/03/23 Age: 64 y.o. Gender: male  Primary Care Provider: Howard Pouch, DO Consultants: None Code Status: FULL (discussed on admission)  Chief Complaint: loss of consciousness, poor po intake 2/2 throat pain  Assessment and Plan: [name] is a 64 y.o. male presenting with an acute LOC after poor PO intake over the last 5 days. PMH is significant for laryngeal cancer removed 2009, sCHF EF 45-50%, HTN, cirrhosis of the liver, chronic pancreatitis and ETOH dependence.    #Loss of Consciousness: Neuro exam unrevealing.  CXR no acute processes.  CT head no acute processes.  CBC unremarkable.  K 3.4.  No h/o seizures.  EKG no ischemic changes. DDx: hypoglycemia (poor PO intake, cbg 80) vs dehydration vs arrhythmia vs orthostasis vs new onset seizures (reports loss of urine) vs stroke (no neuro deficits on exam) -Admit under Dr Lindell Noe to telemetry -VS per floor protocol -Obtain orthostatic VS -2D echo in am -carotid dopplers ordered -cycle troponins -repeat EKG in am -seizure precautions - if has recurrence would consult neurology  #Poor oral intake 2/2 throat pain: Exudate appreciated on exam.  Strep vs thrush vs esophageal candidiasis -Rapid strep, HIV and A1c ordered -Magic mouthwash w/lidocaine  -Nystatin suspension QID -gentle hydration w/ IVFs -Consider SLP evaluation -consult GI for consideration of EGD to evaluate for esophageal candidiasis  #HTN: uncontrolled BP on admission 198/106 s/p 1 dose IV hydralazine 10mg  at [facility]. On Hydralazine at home.  Has not taken BP meds 2/2 pain. -IV Hydralazine 10 mg q4 hr prn for sytolic >712 and diastolic >458 -restart home lisinopril and bisoprolol  #Weight loss: 23 lb weight loss since 07/2013.  Appears very thin/malnourished on exam.  Reports never having  colonoscopy. This is concerning for recurrence of disease vs mets vs new primary cancer vs HIV vs DM vs mal absorption from chronic pancreatitis. -HIV Ab -FOBT -GI consult for consideration of outpatient colonoscopy and inpatient EGD -CT scan soft tissue head and neck -CT scan chest, abd, pelvis -should consider urology eval given prior imaging with asymmetric bladder wall thickening and prior history of hematuria  #Systolic CHF: last echo 0998, moderate LVH, EF 45-50%, w/ grade 1 diastolic dysfunction -Repeat echo  -Will be cautious with fluids  #h/o laryngeal cancer with resection 2009.  Reportedly "cleared" by ENT -concern for recurrence with weight loss -it appears the last time he saw ENT at [facility] was March of 2015  #Cirrhosis of liver: LFTs WNL.  Liver not palpable on exam.  #ETOH dependence: Last reported drink 2 weeks ago. Usually drinks "1 pint" over 2 weeks.  No evidence of withdrawal on admission -CIWA protocol -MVI, Thiamine -UDS  #Chronic pancreatitis: Creon at home. Lipase 18 -Continue home medication  #Hypothyroidism: Synthroid 250 mcg @ home -Continue home medication   #Hyperlipidemia: Lipitor 20mg  @ home -Continue home medication  FEN/GI: NS @75  cc/h, protonix, Clear liquid diet Prophylaxis: sub-q heparin  Disposition: Admit to telemetry under Dr Lindell Noe.  Discharge pending LOC and weight loss w/u and improved PO intake  History of Present Illness: [name] is a 64 y.o. male presenting with loss of consciousness.  PMH is significant for laryngeal cancer removed 2009, sCHF EF 45-50%, HTN, cirrhosis of the liver, chronic pancreatitis and ETOH dependence.   In [facility] ED, patient had CT head negative for acute processes, CXR neg, K 3.4.  In  addition, UA was obtained and was traumatic.  UA remarkable for 40 ketones, 100 protein, trace leukocytes, large amounts of Hgb.  He also received 1 dose of IV Hydralazine 10mg  for BPs in the 190's/100's.  He also was hydrated  with 125cc/h there.  Patient was transferred to the [place] at [facility] for further healthcare management.    Per [facility] PA, patient lost consciousness earlier today at a bank after he had not eaten x5 days.  He began shaking for a short period of time after falling and was unarouseable for a couple of minutes.  No h/o seizures.    Patient is nonverbal 2/2 to laryngeal resection but can mouth words and nod to yes/no questions.     Patient reports that he has had throat pain, particularly along the upper portion of his throat.  This has prevented him from eating or drinking over the last 4-5 days.  He normally uses throat spray to help with this but had not been using it recently.  He reports that he saw ENT today and that they said there was nothing additional to do about his throat.  He reports that he was standing at the bank today and passed out.  He does not remember passing out or any of the reported "shaking" that occurred afterward.  He does recall waking up on the bank table with EMS around him.  He endorses urinary incontinence at time of event.  He states that he has been urinating normally with no blood. Denies dizziness, palpitations, CP, SOB, vision changes, nausea, vomiting, diarrhea, bloody sputum or stool.  Patient endorses drinking 1 pint over the course of 2 weeks.  He has not had a drink in 2 weeks.  Denies previous seizure like episodes.  Patient also reports weight loss of 60 lbs over the past 1.5 months. He notes no prior colonoscopy. Denies blood in stool and urine. Patient had imaging of neck done in October 2015 that had stable post surgical and treatment changes. He had CT chest abd pelvis in March 2015 that did not reveal any acute issues, and commented on chronic pancreatitis and bladder thickening with an asymmetric thickening of his right bladder wall.  Review Of Systems: Per HPI with the following additions: none Otherwise 12 point review of systems was performed and was  unremarkable.  Patient Active Problem List   Diagnosis Date Noted  . Pancreatitis, chronic 05/20/2013  . Adrenal insufficiency, primary, iatrogenic 05/20/2013  . Hyperlipidemia   . Osteoarthritis   . Hypotension, unspecified 05/10/2013  . Dehydration 05/08/2013  . Chest pain 03/12/2013  . Loss of weight 03/12/2013  . Sore throat 02/07/2013  . Pain in joint, upper arm 11/28/2012  . Elevated serum creatinine 11/28/2012  . Chronic systolic heart failure 47/42/5956  . Alcohol dependence 01/04/2012  . Decreased appetite 01/03/2012  . Acute and chronic respiratory failure 12/26/2011  . Acute systolic HF (heart failure) 12/26/2011  . 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 Dyspnea 11/10/2011  . Allergic rhinitis 06/22/2011  . Hypothyroidism 10/16/2010  . NEOPLASM, MALIGNANT, LARYNX 01/10/2010  . PULMONARY NODULE 12/31/2007  . ESSENTIAL HYPERTENSION, BENIGN 11/08/2007   Past Medical History: Past Medical History  Diagnosis Date  . Hypertension   . Cirrhosis of liver   . Hypothyroidism   . Alcohol abuse   . CHF (congestive heart failure)     EF 45-50% 05/2012  . Hyperlipidemia   . Arthritis   . Laryngeal cancer 2011  Laryngectomy, T3N0M0   Past Surgical History: Past Surgical History  Procedure Laterality Date  . Tracheal surgery    . Tracheostomy    . Left and right heart catheterization with coronary angiogram N/A 12/28/2011    Procedure: LEFT AND RIGHT HEART CATHETERIZATION WITH CORONARY ANGIOGRAM;  Surgeon: Jolaine Artist, MD;  Location: [facility] CATH LAB;  Service: Cardiovascular;  Laterality: N/A;   Social History: History  Substance Use Topics  . Smoking status: Former Smoker -- 0.20 packs/day for 50 years    Types: Cigarettes  . Smokeless tobacco: Never Used  . Alcohol Use: Yes     Comment: "rarely"   Additional social history: none  Please also refer to relevant sections of EMR.  Family History: No family  history on file. Allergies and Medications: Allergies  Allergen Reactions  . Vicodin [Hydrocodone-Acetaminophen] Rash  . Tylenol [Acetaminophen]     HBP -Liver Sorosis   No current facility-administered medications on file prior to encounter.   Current Outpatient Prescriptions on File Prior to Encounter  Medication Sig Dispense Refill  . bisoprolol (ZEBETA) 10 MG tablet TAKE 1 TABLET BY MOUTH EVERY DAY 30 tablet 0  . guaiFENesin (MUCINEX) 600 MG 12 hr tablet Take 600 mg by mouth daily as needed for cough or to loosen phlegm.     . hydrALAZINE (APRESOLINE) 25 MG tablet TAKE 2 BY MOUTH TWICE DAILY AT LUNCH AND AFTER DINNER. Follow up with PCP. (Patient taking differently: Take 25 mg by mouth 2 (two) times daily. TWICE DAILY AT LUNCH AND AFTER DINNER. Follow up with PCP.) 60 tablet 0  . levothyroxine (SYNTHROID, LEVOTHROID) 125 MCG tablet Take 2 tablets (250 mcg total) by mouth daily before breakfast. 60 tablet 11  . lipase/protease/amylase (CREON-12/PANCREASE) 12000 UNITS CPEP capsule Take 1 capsule by mouth 3 (three) times daily before meals. 270 capsule 4  . lisinopril (PRINIVIL,ZESTRIL) 10 MG tablet Take 1 tablet (10 mg total) by mouth daily. 90 tablet 3  . megestrol (MEGACE) 40 MG/ML suspension TAKE 10 ML BY MOUTH DAILY 300 mL 11  . ondansetron (ZOFRAN) 4 MG tablet Take 1 tablet (4 mg total) by mouth every 8 (eight) hours as needed for nausea or vomiting. 60 tablet 0  . pantoprazole (PROTONIX) 40 MG tablet Take 1 tablet (40 mg total) by mouth at bedtime. 30 tablet 11  . amLODipine (NORVASC) 5 MG tablet Take 1 tablet (5 mg total) by mouth daily. NEEDS OFFICE VISIT (Patient not taking: Reported on 04/08/2014) 30 tablet 0  . atorvastatin (LIPITOR) 20 MG tablet Take 1 tablet (20 mg total) by mouth daily. (Patient not taking: Reported on 04/08/2014) 30 tablet 7  . traMADol (ULTRAM) 50 MG tablet Take 2 tablets (100 mg total) by mouth every 6 (six) hours as needed. (Patient not taking: Reported on  04/08/2014) 20 tablet 0    Objective: BP 198/106 mmHg  Pulse 96  Temp(Src) 97.8 F (36.6 C) (Oral)  Resp 20  Wt 104 lb 8 oz (47.401 kg)  SpO2 100% Exam: General: awake, alert, thin appearing male, NAD, RN at bedside HEENT: [place]/AT, PERRL, o/p with cream colored tonsillar exudate, MMM Neck: 1/8"x1/8" opening at suprasternal notch, no evidence of infection, non bleeding, with hypopigmentation surrounding area; there is a firmness in the soft tissue of the under side of his palate Cardiovascular: RRR, no m/r/g Respiratory: CTAB, no increased WOB Abdomen: soft, NT/ND, no suprapubic TTP, +BS Extremities: thin; no cyanosis, clubbing or edema Skin: as above with marked thickening of submandibular skin, otherwise  dry, warm Neuro: no focal deficits appreciated other than being non verbal, CN 2-12 grossly in tact, follows commands, mouths appropriate responses to questions, 5/5 strength bilateral UE and LE, sensation to light touch intact bilateral UE and LE  Labs and Imaging: CBC BMET   Recent Labs Lab 04/08/14 1825  WBC 4.8  HGB 11.9*  HCT 36.4*  PLT 245    Recent Labs Lab 04/08/14 1825  NA 144  K 3.4*  CL 107  CO2 21  BUN 21  CREATININE 1.07  GLUCOSE 80  CALCIUM 9.7     Dg Chest 2 View  04/08/2014   CLINICAL DATA:  Loss of appetite with altered level of consciousness, syncopal episode and seizure-like activity. History of throat cancer. Initial encounter.  EXAM: CHEST  2 VIEW  COMPARISON:  Chest CT 05/12/2013.  Radiographs 05/08/2013.  FINDINGS: The heart size and mediastinal contours are stable. The lungs are clear. There is no pleural effusion or pneumothorax. Patient appears emaciated. No osseous abnormalities identified. There are surgical clips in the lower right neck.  IMPRESSION: No active cardiopulmonary process.   Electronically Signed   By: Camie Patience M.D.   On: 04/08/2014 17:13   Ct Head Wo Contrast  04/08/2014   CLINICAL DATA:  Acute loss of appetite. Altered level  of consciousness. Syncope and seizure like activity. Initial encounter.  EXAM: CT HEAD WITHOUT CONTRAST  TECHNIQUE: Contiguous axial images were obtained from the base of the skull through the vertex without intravenous contrast.  COMPARISON:  CT of the head performed 05/08/2013  FINDINGS: There is no evidence of acute infarction, mass lesion, or intra- or extra-axial hemorrhage on CT.  Prominence of the ventricles and sulci reflects mild to moderate cortical volume loss. Cerebellar atrophy is noted. Scattered periventricular and subcortical white matter change likely reflects small vessel ischemic microangiopathy.  The brainstem and fourth ventricle are within normal limits. The basal ganglia are unremarkable in appearance. The cerebral hemispheres demonstrate grossly normal gray-white differentiation. No mass effect or midline shift is seen.  There is no evidence of fracture; visualized osseous structures are unremarkable in appearance. The visualized portions of the orbits are within normal limits. There is mild partial opacification of the sphenoid sinus. The remaining paranasal sinuses and mastoid air cells are well-aerated. No significant soft tissue abnormalities are seen.  IMPRESSION: 1. No acute intracranial pathology seen on CT. 2. Mild to moderate cortical volume loss and scattered small vessel ischemic microangiopathy. 3. Mild partial opacification of the sphenoid sinus.   Electronically Signed   By: Garald Balding M.D.   On: 04/08/2014 18:15    Janora Norlander, DO 04/09/2014, 2:02 AM PGY-1, [place] Intern pager: [phone], text pages welcome  Upper Level Addendum:  I have seen and evaluated this patient along with Dr. Lajuana Ripple and reviewed the above note, making necessary revisions in red.   Tommi Rumps, MD Family Medicine PGY-2

## 2014-04-09 NOTE — Progress Notes (Signed)
New Admission Note:   Arrival Method: via ambulance, transfer from [facility] Orientation: Alert & oriented x 4  Telemetry: Assessment: Completed Skin: Intact IV: SL Pain: complains of throat discomfort, MD aware Tubes: None Safety Measures: Safety Fall Prevention Plan has been given, discussed and signed 6 [place] Orientation: Patient has been orientated to the room, unit and staff.  Family: No family at bedside, daughter previously went home  Admitting physicians at bedside. Will continue to monitor the patient. Call light has been placed within reach and bed alarm has been activated.   Mady Gemma, RN-BC Phone: [phone]

## 2014-04-10 ENCOUNTER — Inpatient Hospital Stay (HOSPITAL_COMMUNITY): Payer: Medicare Other | Admitting: Anesthesiology

## 2014-04-10 ENCOUNTER — Encounter (HOSPITAL_COMMUNITY)
Admission: EM | Disposition: A | Discharge status: Home Health | Payer: Self-pay | Source: Home / Self Care | Attending: Family Medicine

## 2014-04-10 ENCOUNTER — Encounter (HOSPITAL_COMMUNITY): Payer: Self-pay | Admitting: *Deleted

## 2014-04-10 DIAGNOSIS — E039 Hypothyroidism, unspecified: Secondary | ICD-10-CM

## 2014-04-10 DIAGNOSIS — J029 Acute pharyngitis, unspecified: Secondary | ICD-10-CM

## 2014-04-10 DIAGNOSIS — E43 Unspecified severe protein-calorie malnutrition: Secondary | ICD-10-CM

## 2014-04-10 DIAGNOSIS — R55 Syncope and collapse: Secondary | ICD-10-CM

## 2014-04-10 DIAGNOSIS — E41 Nutritional marasmus: Secondary | ICD-10-CM

## 2014-04-10 HISTORY — PX: ESOPHAGOGASTRODUODENOSCOPY: SHX5428

## 2014-04-10 LAB — BASIC METABOLIC PANEL
Anion gap: 4 — ABNORMAL LOW (ref 5–15)
BUN: 6 mg/dL (ref 6–23)
CO2: 25 mmol/L (ref 19–32)
Calcium: 8.8 mg/dL (ref 8.4–10.5)
Chloride: 110 mmol/L (ref 96–112)
Creatinine, Ser: 0.79 mg/dL (ref 0.50–1.35)
GFR calc Af Amer: >90 mL/min (ref >90)
GFR calc non Af Amer: >90 mL/min (ref >90)
Glucose, Bld: 100 mg/dL — ABNORMAL HIGH (ref 70–99)
POTASSIUM: 3.1 mmol/L — AB (ref 3.5–5.1)
SODIUM: 139 mmol/L (ref 135–145)

## 2014-04-10 LAB — HEMOGLOBIN A1C
Hgb A1c MFr Bld: 5.8 % — ABNORMAL HIGH (ref 4.8–5.6)
Mean Plasma Glucose: 120 mg/dL

## 2014-04-10 LAB — MAGNESIUM: Magnesium: 1.6 mg/dL (ref 1.5–2.5)

## 2014-04-10 LAB — CBC
HCT: 32.3 % — ABNORMAL LOW (ref 39.0–52.0)
HEMOGLOBIN: 10.7 g/dL — AB (ref 13.0–17.0)
MCH: 26 pg (ref 26.0–34.0)
MCHC: 33.1 g/dL (ref 30.0–36.0)
MCV: 78.4 fL (ref 78.0–100.0)
Platelets: 177 10*3/uL (ref 150–400)
RBC: 4.12 MIL/uL — ABNORMAL LOW (ref 4.22–5.81)
RDW: 16.2 % — AB (ref 11.5–15.5)
WBC: 3.6 10*3/uL — ABNORMAL LOW (ref 4.0–10.5)

## 2014-04-10 LAB — TSH: TSH: <0.01 u[IU]/mL — ABNORMAL LOW (ref 0.350–4.500)

## 2014-04-10 LAB — HIV ANTIBODY (ROUTINE TESTING W REFLEX): HIV Screen 4th Generation wRfx: NONREACTIVE

## 2014-04-10 SURGERY — EGD (ESOPHAGOGASTRODUODENOSCOPY)
Anesthesia: Monitor Anesthesia Care

## 2014-04-10 MED ORDER — LABETALOL HCL 5 MG/ML IV SOLN
INTRAVENOUS | Status: DC | PRN
Start: 2014-04-10 — End: 2014-04-10
  Administered 2014-04-10 (×3): 5 mg via INTRAVENOUS

## 2014-04-10 MED ORDER — LACTATED RINGERS IV SOLN
INTRAVENOUS | Status: DC | PRN
  Administered 2014-04-10: 09:00:00 via INTRAVENOUS

## 2014-04-10 MED ORDER — BISOPROLOL FUMARATE 10 MG PO TABS
10.0000 mg | ORAL_TABLET | Freq: Every day | ORAL | Status: DC
  Administered 2014-04-10 – 2014-04-14 (×5): 10 mg via ORAL
  Filled 2014-04-10 (×5): qty 1

## 2014-04-10 MED ORDER — HYDRALAZINE HCL 25 MG PO TABS
25.0000 mg | ORAL_TABLET | Freq: Two times a day (BID) | ORAL | Status: DC
  Administered 2014-04-10 – 2014-04-14 (×8): 25 mg via ORAL
  Filled 2014-04-10 (×10): qty 1

## 2014-04-10 MED ORDER — LISINOPRIL 10 MG PO TABS
10.0000 mg | ORAL_TABLET | Freq: Every day | ORAL | Status: DC
  Administered 2014-04-10 – 2014-04-14 (×5): 10 mg via ORAL
  Filled 2014-04-10 (×5): qty 1

## 2014-04-10 MED ORDER — PROPOFOL INFUSION 10 MG/ML OPTIME
INTRAVENOUS | Status: DC | PRN
  Administered 2014-04-10: 120 ug/kg/min via INTRAVENOUS

## 2014-04-10 MED ORDER — PROPOFOL 10 MG/ML IV BOLUS
INTRAVENOUS | Status: DC | PRN
  Administered 2014-04-10: 40 mg via INTRAVENOUS
  Administered 2014-04-10: 20 mg via INTRAVENOUS

## 2014-04-10 MED ORDER — POTASSIUM CHLORIDE CRYS ER 20 MEQ PO TBCR
40.0000 meq | EXTENDED_RELEASE_TABLET | Freq: Once | ORAL | Status: AC
  Administered 2014-04-10: 40 meq via ORAL
  Filled 2014-04-10: qty 2

## 2014-04-10 MED ORDER — HYDRALAZINE HCL 25 MG PO TABS: 25.0000 mg | ORAL_TABLET | Freq: Two times a day (BID) | ORAL | Status: DC

## 2014-04-10 NOTE — Progress Notes (Addendum)
[place] teaching service called to ask for consult on [name]. He apparently passed out 03/29/14 and was admitted at [facility]. He had a neck CT that I reviewed on which radiology noted some abnormal mucosal contrast enhancement on on the soft/hard palate and bilateral pharynx. They favored inflammation but could not rule out malignancy and recommended direct visualization. I explained to the [facility] Medicine resident that I examined him 02/17/14 and performed pharyngoscopy/tracheoscopy in December with no abnormal findings and again saw him on 04/08/14 with no obvious masses or abnormal findings on transoral exam. I did not feel a new exam 2 days later was warranted since if he has a metachronous palate malignancy he will need an open maxillectomy which is beyond my expertise. I recommended he follow up with [facility] and Neck oncology/ENT who did his original laryngectomy in 2011. 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 If the biopsy of the hypopharyngeal mass is positive he should see [facility] and Neck oncology as he would likely need a salvage pharyngectomy/possible glossectomy/possible maxillectomy and would likely need a free tissue flap, all of which is beyond my expertise.  Ruby Cola, MD  11:58 AM  04/10/2014

## 2014-04-10 NOTE — Care Management Note (Signed)
CARE MANAGEMENT NOTE 04/10/2014  Patient:  [name], [name]   Account Number:  [id number]  Date Initiated:  04/10/2014  Documentation initiated by:  Jacquese Cassarino  Subjective/Objective Assessment:   CM following for progression and d/c planning.     Action/Plan:   Anticipated DC Date:  04/12/2014   Anticipated DC Plan:  [place]         Choice offered to / List presented to:             Status of service:  In process, will continue to follow Medicare Important Message given?  YES (If response is "NO", the following Medicare IM given date fields will be blank) Date Medicare IM given:  04/10/2014 Medicare IM given by:  Mareon Robinette Date Additional Medicare IM given:   Additional Medicare IM given by:    Discharge Disposition:    Per UR Regulation:    If discussed at Long Length of Stay Meetings, dates discussed:    Comments:

## 2014-04-10 NOTE — Progress Notes (Signed)
GI: EGD showed apparent previous laryngectomy with a friable hypopharyngeal mass which was biopsied. 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 Other than some mild proximal fibrosis the esophagus was essentially normal. Will await biopsy results and will need oncology and possibly ENT consults.

## 2014-04-10 NOTE — Transfer of Care (Signed)
Immediate Anesthesia Transfer of Care Note  Patient: [name]  Procedure(s) Performed: Procedure(s): ESOPHAGOGASTRODUODENOSCOPY (EGD) (N/A)  Patient Location: Endoscopy Unit  Anesthesia Type:MAC  Level of Consciousness: awake, alert  and oriented  Airway & Oxygen Therapy: Patient Spontanous Breathing  Post-op Assessment: Report given to RN, Post -op Vital signs reviewed and stable and Patient moving all extremities  Post vital signs: Reviewed and stable  Last Vitals:  Filed Vitals:   04/10/14 0945  BP:   Pulse: 97  Temp:   Resp: 24    Complications: No apparent anesthesia complications

## 2014-04-10 NOTE — Progress Notes (Signed)
Family Medicine Teaching Service Daily Progress Note Intern Pager: [phone]  Patient name: [name] Medical record number: [id number] Date of birth: September 03, 1950 Age: 64 y.o. Gender: male  Primary Care Provider: Howard Pouch, DO Consultants: none Code Status: FULL (discussed on admission)  Pt Overview and Major Events to Date:    Assessment and Plan: [name] is a 64 y.o. male presenting with an acute LOC after poor PO intake over the last 5 days. PMH is significant for laryngeal cancer removed 2009, sCHF EF 45-50%, HTN, cirrhosis of the liver, chronic pancreatitis and ETOH dependence.   #Loss of Consciousness: Neuro exam unrevealing. CXR no acute processes. CT head no acute processes. CBC unremarkable.No h/o seizures. EKG no ischemic changes. DDx: hypoglycemia (poor PO intake) vs dehydration vs arrhythmia vs orthostasis vs new onset seizures (reports loss of urine) vs stroke (no neuro deficits on exam). OVS POSITIVE. Trops neg x3. Carotid dopplers with R 1-39% and L 40-59% stenosis, anterograde flow. EKG no ischemic changes -telemetry -2D echo ordered -on seizure precautions.  C/s to neuro if recurrence of seizure like activity  #Poor oral intake 2/2 throat pain: Exudate appreciated on exam. Strep (rapid negative) vs o/p candidiasis vs esophagitis.  Responded well to MMW w/ lido. A1c 5.8 -HIV pending -Strep cx pending -Nystatin suspension QID, MMW with Lidocaine -gentle hydration w/ IVFs -Consider SLP evaluation -c/s to GI for evaluation: endoscopy this am -In setting of CT soft tissue findings, c/s to [facility] ENT (Dr Simeon Craft) this am.  #HTN: uncontrolled BP on admission 198/106. On Hydralazine, bisoprolol an lisinopril at home. BP elevated overnight 195/125 this am.  No IV hydralazine given even though indication.  Unsure as to why.  Contact RN, she is to give patient's oral meds NOW. -Continue home Bisoprolol and Lisinopril.   -Add back hydralazine PO this  am  #Weight loss: 23 lb weight loss since 07/2013. Appears very thin/malnourished on exam. Reports never having colonoscopy.  Concerning in setting of h/o laryngeal cancer (recurrence vs mets vs new primary cancer) FOBT neg. CT chest/abd with pancreatic calcifications. Also a compression fracture with poss mets @T1 .  Continued thickening of bladder wall. CT soft tissue neck: new irregular mucosal enhancement along the soft palate and extending to palatine tonsils bilaterally, more prominently on the left. This may be related to acute inflammation vs new malignancy -HIV Ab -Recommend outpatient/inpatient colonoscopy -Consider c/s to urology  #Hypokalemia: K3.4>3.1>3.1 -KDur 9meq -Mg level ordered -monitor and replete PRN  #Systolic CHF: last echo 3614, moderate LVH, EF 45-50%, w/ grade 1 diastolic dysfunction -Repeat echo  -Will be cautious with fluids  #h/o laryngeal cancer with resection 2009. Reportedly "cleared" by ENT however no records from [facility] since 05/2013 -Concern for weight loss over last year.  #Cirrhosis of liver: LFTs WNL. Liver not palpable on exam.  #ETOH dependence: Last reported drink 2 weeks ago. Usually drinks "1 pint" over 2 weeks. No evidence of withdrawal on admission -CIWA protocol -MVI, Thiamine -UDS  #Chronic pancreatitis: Creon at home. Lipase 18 -Continue home medication  #Hypothyroidism: Synthroid 250 mcg @ home -Continue home medication   #Hyperlipidemia: Lipitor 20mg  @ home -Continue home medication  FEN/GI: NS @75  cc/h, protonix, Clear liquid diet Prophylaxis: sub-q heparin  Disposition: Discharge pending improvement in PO and evaluation of LOC & weight loss work up  Subjective:  Patient off floor undergoing endoscopy.  Will revisit this afternoon.  Discussed with RN twice this morning to please give patient's BP medications.  Unfortunately, patient went down  to endoscopy before they could be administered.  RN voices that she will  administer as soon as patient returns and will obtain VS at least 30 mins after.  Objective: Temp:  [98.2 F (36.8 C)-100 F (37.8 C)] 100 F (37.8 C) (02/05 0824) Pulse Rate:  [83-101] 83 (02/05 0824) Resp:  [14-18] 14 (02/05 0824) BP: (154-213)/(92-125) 213/108 mmHg (02/05 0824) SpO2:  [98 %-100 %] 100 % (02/05 0824) Weight:  [110 lb 9.6 oz (50.168 kg)] 110 lb 9.6 oz (50.168 kg) (02/04 2031)  No data found.   Physical Exam: Patient off floor undergoing endoscopy.  Will revisit this afternoon.  Laboratory:  Recent Labs Lab 04/08/14 1825 04/09/14 0620 04/10/14 0656  WBC 4.8 5.4 3.6*  HGB 11.9* 9.7* 10.7*  HCT 36.4* 29.6* 32.3*  PLT 245 209 177    Recent Labs Lab 04/08/14 1825 04/09/14 0620 04/10/14 0656  NA 144 143 139  K 3.4* 3.1* 3.1*  CL 107 113* 110  CO2 21 20 25   BUN 21 13 6   CREATININE 1.07 1.00 0.79  CALCIUM 9.7 8.9 8.8  PROT 8.2  --   --   BILITOT 1.5*  --   --   ALKPHOS 84  --   --   ALT 10  --   --   AST 18  --   --   GLUCOSE 80 75 100*   Lipase 18  Urinalysis    Component Value Date/Time   COLORURINE RED* 04/09/2014 0018   APPEARANCEUR TURBID* 04/09/2014 0018   LABSPEC 1.021 04/09/2014 0018   PHURINE 5.5 04/09/2014 0018   GLUCOSEU NEGATIVE 04/09/2014 0018   HGBUR LARGE* 04/09/2014 0018   BILIRUBINUR MODERATE* 04/09/2014 0018   KETONESUR 40* 04/09/2014 0018   PROTEINUR 100* 04/09/2014 0018   UROBILINOGEN 1.0 04/09/2014 0018   NITRITE NEGATIVE 04/09/2014 0018   LEUKOCYTESUR TRACE* 04/09/2014 0018    Imaging/Diagnostic Tests: Dg Chest 2 View  04/08/2014   CLINICAL DATA:  Loss of appetite with altered level of consciousness, syncopal episode and seizure-like activity. History of throat cancer. Initial encounter.  EXAM: CHEST  2 VIEW  COMPARISON:  Chest CT 05/12/2013.  Radiographs 05/08/2013.  FINDINGS: The heart size and mediastinal contours are stable. The lungs are clear. There is no pleural effusion or pneumothorax. Patient appears  emaciated. No osseous abnormalities identified. There are surgical clips in the lower right neck.  IMPRESSION: No active cardiopulmonary process.   Electronically Signed   By: Camie Patience M.D.   On: 04/08/2014 17:13   Ct Head Wo Contrast  04/08/2014   CLINICAL DATA:  Acute loss of appetite. Altered level of consciousness. Syncope and seizure like activity. Initial encounter.  EXAM: CT HEAD WITHOUT CONTRAST  TECHNIQUE: Contiguous axial images were obtained from the base of the skull through the vertex without intravenous contrast.  COMPARISON:  CT of the head performed 05/08/2013  FINDINGS: There is no evidence of acute infarction, mass lesion, or intra- or extra-axial hemorrhage on CT.  Prominence of the ventricles and sulci reflects mild to moderate cortical volume loss. Cerebellar atrophy is noted. Scattered periventricular and subcortical white matter change likely reflects small vessel ischemic microangiopathy.  The brainstem and fourth ventricle are within normal limits. The basal ganglia are unremarkable in appearance. The cerebral hemispheres demonstrate grossly normal gray-white differentiation. No mass effect or midline shift is seen.  There is no evidence of fracture; visualized osseous structures are unremarkable in appearance. The visualized portions of the orbits are within normal limits. There  is mild partial opacification of the sphenoid sinus. The remaining paranasal sinuses and mastoid air cells are well-aerated. No significant soft tissue abnormalities are seen.  IMPRESSION: 1. No acute intracranial pathology seen on CT. 2. Mild to moderate cortical volume loss and scattered small vessel ischemic microangiopathy. 3. Mild partial opacification of the sphenoid sinus.   Electronically Signed   By: Garald Balding M.D.   On: 04/08/2014 18:15   Ct Soft Tissue Neck W Contrast  04/09/2014   CLINICAL DATA:  Throat cancer. Pain under the jaw. 50 pound weight loss over the last 6 weeks.  EXAM: CT NECK  WITH CONTRAST  TECHNIQUE: Multidetector CT imaging of the neck was performed using the standard protocol following the bolus administration of intravenous contrast.  CONTRAST:  100 mL Omnipaque 300  COMPARISON:  CT neck with contrast 12/25/2013.  FINDINGS: Pharynx and larynx: Extensive irregular mucosal thickening is noted along the soft palate and palatine tonsil region bilaterally. This extends posteriorly on the left. No discrete mass lesion is present.  Patient is status post laryngectomy and bilateral modified radical neck dissection. Scar tissue is stable. There is no evidence for residual or recurrent tumor in the neck.  Salivary glands: Normal bilaterally.  Thyroid: Minimal thyroid tissue remains on the left. The remainder of the thyroid has been resected.  Lymph nodes: No significant cervical adenopathy is present. Three 7 lingual lymph nodes again noted. These measure up to 7 mm.  Vascular: Apart from surgery, and the cervical vasculature is unremarkable.  Limited intracranial: A 14.5 mm meningioma of the planum sphenoidale is stable, measuring 15 mm. Limited imaging of the brain is otherwise unremarkable.  Visualized orbits: Normal bilaterally  Mastoids and visualized paranasal sinuses: Clear  Skeleton: Endplate degenerative changes are present throughout the cervical spine similar to the prior study. No focal lytic or blastic lesions are evident.  Upper chest: Minimal scarring is present at the lung apices. Atherosclerotic calcifications are present at the aortic arch. The lungs are otherwise clear.  IMPRESSION: 1. New irregular mucosal enhancement along the soft palate and extending to palatine tonsils bilaterally, more prominently on the left. This may be related to acute inflammation. Tumor recurrence is considered less likely but not excluded. Recommend direct visualization. 2. Laryngectomy noted. Bilateral modified radical neck dissection. There is no significant change in the postoperative  appearance of the neck to suggest residual recurrent tumor more inferiorly in the neck. 3. No significant adenopathy in the neck. There are 3 sublingual nodes which are stable since the prior study but appears slightly more prominent than in 2012. 4. Degenerative changes are present within the cervical spine.   Electronically Signed   By: Lawrence Santiago M.D.   On: 04/09/2014 14:03   Ct Chest W Contrast  04/09/2014   CLINICAL DATA:  Throat cancer. Recent abrupt weight loss. Pain under the jaw.  EXAM: CT CHEST, ABDOMEN, AND PELVIS WITH CONTRAST  TECHNIQUE: Multidetector CT imaging of the chest, abdomen and pelvis was performed following the standard protocol during bolus administration of intravenous contrast.  CONTRAST:  168mL OMNIPAQUE IOHEXOL 300 MG/ML  SOLN  COMPARISON:  05/12/2013  FINDINGS: CT CHEST FINDINGS  Mediastinum/Nodes: Atherosclerotic and double thickening in the left common carotid artery at the thoracic inlet. Atherosclerotic calcification of the aortic arch, descending thoracic aorta, left anterior descending, and circumflex coronary arteries.  No pathologic thoracic adenopathy.  Lungs/Pleura: Stable biapical pleural parenchymal scarring. Emphysema. Chronic faint scattered nodularity in both lungs, stable back through 2012 and accordingly  considered benign. No worrisome or enlarging pulmonary nodule is observed. Mild scarring in the lingula. Old granulomatous disease noted.  Musculoskeletal:  There is new sclerosis in the T1 vertebral body with some inferior endplate concavity and mild heterogeneity of the internal density of T1. Remote inferior endplate compression at T6.  CT ABDOMEN PELVIS FINDINGS  Hepatobiliary: Unremarkable  Pancreas: Abnormal clustered calcifications in the pancreatic head and uncinate process similar to prior and probably the result of prior chronic pancreatitis. Scant punctate calcifications along the parenchyma of the rest of the pancreas. No discrete pancreatic mass is  identified nor is there is significant contour change compared to 05/12/13. However, the pancreatic calcifications are new compared to 11/14/2010.  Spleen: Unremarkable  Adrenals/Urinary Tract: Bilateral extrarenal pelvis. Punctate 2 mm left kidney lower pole nonobstructive calculus. 5 mm right kidney lower pole nonobstructive calculus. Several tiny hypodense renal lesions are technically nonspecific although statistically likely to be cysts. No hydronephrosis. Urinary bladder demonstrates mild wall thickening as before.  Stomach/Bowel: Unremarkable  Vascular/Lymphatic: Aortoiliac atherosclerotic vascular disease. 9 mm gastrohepatic ligament lymph node, stable.  Reproductive: Unremarkable  Other: No supplemental non-categorized findings.  Musculoskeletal: There is evidence of chronic bilateral avascular necrosis of the femoral heads without collapse. Irregularity in the S4 segment of the sacrum compatible with remote healed fracture.  IMPRESSION: 1. New sclerosis in the T1 vertebral body with some inferior endplate concavity ; this could represent a mild inferior endplate compression fracture, with bony metastatic disease and less likely differential diagnostic consideration. 2. Considerable atherosclerosis in the chest, abdomen, and pelvis. 3. Emphysema. 4. Chronic mild nodularity in both lungs, stable from 2012 and accordingly considered benign. 5. Pancreatic calcifications, most clustered in the pancreatic head and uncinate process, likely from prior chronic pancreatitis. These were present in 2015 but not in 2012. 6. Bilateral nonobstructive renal calculi. 7. 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   Electronically Signed   By: Sherryl Barters M.D.   On: 04/09/2014 13:38   Ct Abdomen Pelvis W Contrast  04/09/2014   CLINICAL DATA:  Throat cancer. Recent abrupt weight loss. Pain under the jaw.  EXAM: CT CHEST, ABDOMEN, AND PELVIS WITH CONTRAST  TECHNIQUE: Multidetector CT imaging of the chest,  abdomen and pelvis was performed following the standard protocol during bolus administration of intravenous contrast.  CONTRAST:  135mL OMNIPAQUE IOHEXOL 300 MG/ML  SOLN  COMPARISON:  05/12/2013  FINDINGS: CT CHEST FINDINGS  Mediastinum/Nodes: Atherosclerotic and double thickening in the left common carotid artery at the thoracic inlet. Atherosclerotic calcification of the aortic arch, descending thoracic aorta, left anterior descending, and circumflex coronary arteries.  No pathologic thoracic adenopathy.  Lungs/Pleura: Stable biapical pleural parenchymal scarring. Emphysema. Chronic faint scattered nodularity in both lungs, stable back through 2012 and accordingly considered benign. No worrisome or enlarging pulmonary nodule is observed. Mild scarring in the lingula. Old granulomatous disease noted.  Musculoskeletal:  There is new sclerosis in the T1 vertebral body with some inferior endplate concavity and mild heterogeneity of the internal density of T1. Remote inferior endplate compression at T6.  CT ABDOMEN PELVIS FINDINGS  Hepatobiliary: Unremarkable  Pancreas: Abnormal clustered calcifications in the pancreatic head and uncinate process similar to prior and probably the result of prior chronic pancreatitis. Scant punctate calcifications along the parenchyma of the rest of the pancreas. No discrete pancreatic mass is identified nor is there is significant contour change compared to 05/12/13. However, the pancreatic calcifications are new compared to 11/14/2010.  Spleen: Unremarkable  Adrenals/Urinary Tract: Bilateral  extrarenal pelvis. Punctate 2 mm left kidney lower pole nonobstructive calculus. 5 mm right kidney lower pole nonobstructive calculus. Several tiny hypodense renal lesions are technically nonspecific although statistically likely to be cysts. No hydronephrosis. Urinary bladder demonstrates mild wall thickening as before.  Stomach/Bowel: Unremarkable  Vascular/Lymphatic: Aortoiliac atherosclerotic  vascular disease. 9 mm gastrohepatic ligament lymph node, stable.  Reproductive: Unremarkable  Other: No supplemental non-categorized findings.  Musculoskeletal: There is evidence of chronic bilateral avascular necrosis of the femoral heads without collapse. Irregularity in the S4 segment of the sacrum compatible with remote healed fracture.  IMPRESSION: 1. New sclerosis in the T1 vertebral body with some inferior endplate concavity ; this could represent a mild inferior endplate compression fracture, with bony metastatic disease and less likely differential diagnostic consideration. 2. Considerable atherosclerosis in the chest, abdomen, and pelvis. 3. Emphysema. 4. Chronic mild nodularity in both lungs, stable from 2012 and accordingly considered benign. 5. Pancreatic calcifications, most clustered in the pancreatic head and uncinate process, likely from prior chronic pancreatitis. These were present in 2015 but not in 2012. 6. Bilateral nonobstructive renal calculi. 7. 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   Electronically Signed   By: Sherryl Barters M.D.   On: 04/09/2014 13:38    Janora Norlander, DO 04/10/2014, 9:14 AM PGY-1, [place] Intern pager: [phone], text pages welcome

## 2014-04-10 NOTE — Progress Notes (Signed)
**  Interval Note**  Spoke to Dr Simeon Craft at [facility], who states that he "recently scoped patient in January and saw nothing."  He does not feel that it is appropriate at this time to see patient in the hospital in setting of recent office visit where patient's exam was unremarkable.  In addition, Dr Simeon Craft recommends that in the event patient needs a biopsy he should seek care at [facility], as this procedure would be outside of Dr Theressa Millard practice.  Appreciate phone call back.  Patient currently being evaluated by GI.  Will continue to tailor medical care as appropriate.  Ashly M. Lajuana Ripple, DO PGY-1, [place]

## 2014-04-10 NOTE — Progress Notes (Signed)
**  Late entry note**  Revisited patient to complete daily exam just after lunchtime today.  He reports good response to MMW.  I updated him on endoscopy findings at that time and he asked that I get in contact with his daughter to discuss findings.  In addition, we discuss that if he elected to seek surgical treatment for lesion that he would need to see ENT at [facility].  Patient seemed to have a good understanding of concerns and tests being ordered.  On exam, patient awake, alert, worried appearing, throat exam unchanged, CTAB, no increased WOB, RRR, no murmurs, abdomen soft NT/ND, extremities thin but warm.  I also spoke to the RN again about his BP, emphasizing importance of giving PRN hydralazine for parameters outlined in MAR.  She voiced good understanding.  Likely c/s to palliative care in am for GOC.  Will continue to monitor.  Assia Meanor M. Lajuana Ripple, DO PGY-1, [place]

## 2014-04-10 NOTE — Op Note (Signed)
[facility] [place], [id number]   ENDOSCOPY PROCEDURE REPORT  PATIENT: [name], [name]  MR#: [id number] BIRTHDATE: [birth year] , 63  yrs. old GENDER: male ENDOSCOPIST: Teena Irani, MD REFERRED BY: PROCEDURE DATE:  May 04, 2014 PROCEDURE: ASA CLASS: INDICATIONS:  dysphagia in a patient with a history of laryngeal cancer MEDICATIONS: M a C TOPICAL ANESTHETIC: Cetacaine  DESCRIPTION OF PROCEDURE: After the risks benefits and alternatives of the procedure were thoroughly explained, informed consent was obtained.  The Pentax Gastroscope [id number] endoscope was introduced through the mouth and advanced to the second portion of the duodenum , Without limitations.  The instrument was slowly withdrawn as the mucosa was fully examined.    patient is status post laryngectomy. There was a hypopharyngeal mass in the 70th previous laryngectomy concerning for malignancy. Biopsies were taken. The upper esophagus showed some slight cylindrical narrowing probably from previous radiation with no resistance to passage of the scope  . The mid and distal esophagus were essentially normal Stomach: Normal     retroflexed view of the GE junction was normal Duodenum: Normal The scope was then withdrawn from the patient and the procedure completed.  COMPLICATIONS: There were no immediate complications.  ENDOSCOPIC IMPRESSION: Apparent hypopharyngeal mass, no extension into the esophagus where there is some chronic proximal fibrosis with slight narrowing   RECOMMENDATIONS: await path.   Will need oncology and possibly ENT consults.  REPEAT EXAM:  eSigned:  Teena Irani, MD May 04, 2014 9:41 AM    CC:  CPT CODES: ICD CODES:  The ICD and CPT codes recommended by this software are interpretations from the data that the clinical staff has captured with the software.  The verification of the translation of this report to the ICD and CPT codes and  modifiers is the sole responsibility of the health care institution and practicing physician where this report was generated.  [employer]. will not be held responsible for the validity of the ICD and CPT codes included on this report.  AMA assumes no liability for data contained or not contained herein. CPT is a Designer, television/film set of the [employer].  PATIENT NAME:  [name], [name] MR#: [id number]

## 2014-04-10 NOTE — Anesthesia Postprocedure Evaluation (Signed)
  Anesthesia Post-op Note  Patient: [name]  Procedure(s) Performed: Procedure(s): ESOPHAGOGASTRODUODENOSCOPY (EGD) (N/A)  Patient Location: PACU  Anesthesia Type:General  Level of Consciousness: awake and alert   Airway and Oxygen Therapy: Patient Spontanous Breathing and Patient connected to nasal cannula oxygen  Post-op Pain: mild  Post-op Assessment: Post-op Vital signs reviewed, [name] Cardiovascular Status Stable, Respiratory Function Stable, Patent Airway and No signs of Nausea or vomiting  Post-op Vital Signs: Reviewed and stable  Last Vitals:  Filed Vitals:   04/10/14 0945  BP:   Pulse: 97  Temp:   Resp: 24    Complications: No apparent anesthesia complications

## 2014-04-10 NOTE — Progress Notes (Signed)
  Echocardiogram 2D Echocardiogram has been performed.  Diamond Nickel 04/10/2014, 12:25 PM

## 2014-04-10 NOTE — Clinical Documentation Improvement (Addendum)
Please document in your progress note and carry over to the discharge summary if you agree with "severe malnutrition in the context of chronic illness" as documented by the registered dietitian.  "25% weight loss in 1 year and severe fat muscle mass loss noted by RD.  Current physician document reflects "weight loss 23 lbs since 07/2013.  Appears very thin / malnourished on exam". BMI 14.58.    Possible Clinical Conditions: -severe malnutrition in context of chronic illness -severe malnutrition in other (please specify) -Other severity of malnutrition (please specify) -Unable to determine at present  Thank you, Mateo Flow, RN [phone] Clinical Documentation Specialist

## 2014-04-11 DIAGNOSIS — Z515 Encounter for palliative care: Secondary | ICD-10-CM | POA: Insufficient documentation

## 2014-04-11 DIAGNOSIS — R1310 Dysphagia, unspecified: Secondary | ICD-10-CM | POA: Insufficient documentation

## 2014-04-11 LAB — CBC
HEMATOCRIT: 30.1 % — AB (ref 39.0–52.0)
HEMOGLOBIN: 10 g/dL — AB (ref 13.0–17.0)
MCH: 25.9 pg — ABNORMAL LOW (ref 26.0–34.0)
MCHC: 33.2 g/dL (ref 30.0–36.0)
MCV: 78 fL (ref 78.0–100.0)
Platelets: 168 10*3/uL (ref 150–400)
RBC: 3.86 MIL/uL — AB (ref 4.22–5.81)
RDW: 16.2 % — AB (ref 11.5–15.5)
WBC: 4.6 10*3/uL (ref 4.0–10.5)

## 2014-04-11 LAB — CULTURE, GROUP A STREP

## 2014-04-11 LAB — BASIC METABOLIC PANEL
Anion gap: 8 (ref 5–15)
BUN: <5 mg/dL — ABNORMAL LOW (ref 6–23)
CO2: 25 mmol/L (ref 19–32)
Calcium: 8.6 mg/dL (ref 8.4–10.5)
Chloride: 102 mmol/L (ref 96–112)
Creatinine, Ser: 0.8 mg/dL (ref 0.50–1.35)
GFR calc Af Amer: >90 mL/min (ref >90)
GFR calc non Af Amer: >90 mL/min (ref >90)
Glucose, Bld: 104 mg/dL — ABNORMAL HIGH (ref 70–99)
Potassium: 3.2 mmol/L — ABNORMAL LOW (ref 3.5–5.1)
SODIUM: 135 mmol/L (ref 135–145)

## 2014-04-11 MED ORDER — LEVOTHYROXINE SODIUM 175 MCG PO TABS
175.0000 ug | ORAL_TABLET | Freq: Every day | ORAL | Status: DC
  Filled 2014-04-11 (×2): qty 1

## 2014-04-11 MED ORDER — POTASSIUM CHLORIDE CRYS ER 20 MEQ PO TBCR
40.0000 meq | EXTENDED_RELEASE_TABLET | Freq: Two times a day (BID) | ORAL | Status: AC
  Administered 2014-04-11 (×2): 40 meq via ORAL
  Filled 2014-04-11 (×2): qty 2

## 2014-04-11 MED ORDER — LEVOTHYROXINE SODIUM 175 MCG PO TABS: 175.0000 ug | ORAL_TABLET | Freq: Every day | ORAL | Status: DC

## 2014-04-11 MED ORDER — LEVOTHYROXINE SODIUM 200 MCG PO TABS: 200.0000 ug | ORAL_TABLET | Freq: Every day | ORAL | Status: DC

## 2014-04-11 MED ORDER — LEVOTHYROXINE SODIUM 150 MCG PO TABS: 150.0000 ug | ORAL_TABLET | Freq: Every day | ORAL | Status: DC

## 2014-04-11 MED ORDER — MAGIC MOUTHWASH
5.0000 mL | Freq: Four times a day (QID) | ORAL | Status: DC | PRN
  Administered 2014-04-13: 5 mL via ORAL
  Filled 2014-04-11 (×3): qty 5

## 2014-04-11 MED ORDER — OXYCODONE HCL 5 MG/5ML PO SOLN
2.5000 mg | ORAL | Status: DC | PRN
  Administered 2014-04-11 – 2014-04-13 (×13): 5 mg via ORAL
  Filled 2014-04-11 (×15): qty 5

## 2014-04-11 MED ORDER — MAGIC MOUTHWASH: 5.0000 mL | Freq: Three times a day (TID) | ORAL | Status: DC | PRN

## 2014-04-11 MED ORDER — LEVOTHYROXINE SODIUM 200 MCG PO TABS
200.0000 ug | ORAL_TABLET | Freq: Every day | ORAL | Status: DC
Start: 2014-04-13 — End: 2014-04-14
  Administered 2014-04-13 – 2014-04-14 (×2): 200 ug via ORAL
  Filled 2014-04-11 (×3): qty 1

## 2014-04-11 NOTE — Discharge Summary (Signed)
[facility] Discharge Summary  Patient name: [name] Medical record number: [id number] Date of birth: May 22, 1950 Age: 64 y.o. Gender: male Date of Admission: 04/08/2014  Date of Discharge: 04/14/14 Admitting Physician: Willeen Niece, MD  Primary Care Provider: Howard Pouch, DO Consultants: GI, Palliative care  Indication for Hospitalization: LOC, poor po intake  Discharge Diagnoses/Problem List:  LOC Anorexia Severe malnutrition Odynophagia  Esophageal mass Weight loss HTN Hypothyroidism ETOH dependence  Disposition: Discharge home with daughter.  Discharge Condition: Stable  Discharge Exam:  BP 125/85 mmHg  Pulse 95  Temp(Src) 98.3 F (36.8 C) (Oral)  Resp 18  Ht 5\' 11"  (1.803 m)  Wt 109 lb 14.4 oz (49.85 kg)  BMI 15.33 kg/m2  SpO2 94%  General: awake, alert, sitting up at bedside finishing breakfast, NAD Cardiovascular: RRR, no m/r/g Respiratory: CTAB, no increased WOB, no wheeze Abdomen: flat, soft, NT/ND, +BS Extremities: very thin but well-perfused and warm, no edema Neuro: follows commands, no focal neurological deficits, is not vocal but is able to nod to questions and mouth words.  Brief Hospital Course:  [name] is a 64 y.o. male that presented with an acute LOC after poor PO intake over the preceding 5 days. PMH is significant for laryngeal cancer removed 2009, sCHF EF 45-50%, HTN, cirrhosis of the liver, chronic pancreatitis and ETOH dependence.   In [facility] ED, patient had CT head negative for acute processes, CXR neg, K 3.4. In addition, UA was obtained and was traumatic. UA remarkable for 40 ketones, 100 protein, trace leukocytes, large amounts of Hgb. He also received 1 dose of IV Hydralazine 10mg  for BPs in the 190's/100's. He also was hydrated with 125cc/h there. Patient was transferred to the [place] [facility] for further healthcare management.   On exam, patient was very thin and nonvocal d/t laryngeal  resection in 2009.  His throat exam was concerning for a possible candidal infection.  Because of the severity throat pain, a GI consult was placed to evaluate for possible esophageal candidiasis.  In addition, he was treated with Nystatin suspension and magic mouthwash for discomfort.  On EGD, a hypopharyngeal mass was appreciated.  A biopsy of this was obtained.   His local ENT was contacted early during hospitalization, who recommended that patient follow up with ENT at [facility] for possible surgical intervention.  Pathology was significant for carcinoma in situ with focal areas suspicious for early invasion.   Patient and family were amenable to seeing ENT at [facility] for further management.  A palliative care consult was placed for recurrence of laryngeal cancer.  Patient is unsure that he would like to pursue radiation/surgical resection again.  The family asked that an outpatient referral be made for palliative care for [place].  Per palliative recommendations, patient was also treated with oxycodone soln, to which his throat pain minimally responded. [name] PO intake improved slightly with pain medication and MMW with lidocaine.  Upon discharge he was tolerating a soft mechanical diet.  [name] BP was controlled after initiating his home medications, which he was able to tolerate orally with the help of the Magic mouthwash with lidocaine.  Patient was noted to have a TSH <0.01.  For this reason, his Synthroid was decreased to 284mcg from 258mcg.    He was also hypokalemic to 3.1.  He was repleted with oral potassium.  This deficit was likely 2/2 poor oral intake.  An appointment to be seen by ENT at [facility] was secured before  discharge (04/15/14 @2 :15pm).  Patient was discharged in stable conditions with PRN pain medication to last him until he is seen by ENT.  Patient and daughter voiced good understanding of plan.   Issues for Follow Up:  1. Repeat TSH in 6 weeks. Synthroid decreased to 247mcg  from 250 in hospital 2. PO intake 3. Appt with [facility] ENT 4. Repeat BMET to evaluate potassium.  Significant Procedures: endoscopy w/ biopsy  Significant Labs and Imaging:   Recent Labs Lab 04/10/14 0656 04/11/14 0755 04/13/14 0742  WBC 3.6* 4.6 4.8  HGB 10.7* 10.0* 10.5*  HCT 32.3* 30.1* 31.2*  PLT 177 168 144*    Recent Labs Lab 04/08/14 1825 04/09/14 0620 04/10/14 0656 04/11/14 0755 04/12/14 1946 04/13/14 0742  NA 144 143 139 135 134* 135  K 3.4* 3.1* 3.1* 3.2* 4.3 4.2  CL 107 113* 110 102 101 100  CO2 21 20 25 25 30 26   GLUCOSE 80 75 100* 104* 102* 92  BUN 21 13 6  <5* <5* 7  CREATININE 1.07 1.00 0.79 0.80 0.84 0.93  CALCIUM 9.7 8.9 8.8 8.6 8.8 9.2  MG  --   --  1.6  --   --   --   ALKPHOS 84  --   --   --   --  106  AST 18  --   --   --   --  19  ALT 10  --   --   --   --  17  ALBUMIN 3.6  --   --   --   --  2.7*   Dg Chest 2 View  04/08/2014   CLINICAL DATA:  Loss of appetite with altered level of consciousness, syncopal episode and seizure-like activity. History of throat cancer. Initial encounter.  EXAM: CHEST  2 VIEW  COMPARISON:  Chest CT 05/12/2013.  Radiographs 05/08/2013.  FINDINGS: The heart size and mediastinal contours are stable. The lungs are clear. There is no pleural effusion or pneumothorax. Patient appears emaciated. No osseous abnormalities identified. There are surgical clips in the lower right neck.  IMPRESSION: No active cardiopulmonary process.   Electronically Signed   By: Camie Patience M.D.   On: 04/08/2014 17:13   Ct Head Wo Contrast  04/08/2014   CLINICAL DATA:  Acute loss of appetite. Altered level of consciousness. Syncope and seizure like activity. Initial encounter.  EXAM: CT HEAD WITHOUT CONTRAST  TECHNIQUE: Contiguous axial images were obtained from the base of the skull through the vertex without intravenous contrast.  COMPARISON:  CT of the head performed 05/08/2013  FINDINGS: There is no evidence of acute infarction, mass lesion, or  intra- or extra-axial hemorrhage on CT.  Prominence of the ventricles and sulci reflects mild to moderate cortical volume loss. Cerebellar atrophy is noted. Scattered periventricular and subcortical white matter change likely reflects small vessel ischemic microangiopathy.  The brainstem and fourth ventricle are within normal limits. The basal ganglia are unremarkable in appearance. The cerebral hemispheres demonstrate grossly normal gray-white differentiation. No mass effect or midline shift is seen.  There is no evidence of fracture; visualized osseous structures are unremarkable in appearance. The visualized portions of the orbits are within normal limits. There is mild partial opacification of the sphenoid sinus. The remaining paranasal sinuses and mastoid air cells are well-aerated. No significant soft tissue abnormalities are seen.  IMPRESSION: 1. No acute intracranial pathology seen on CT. 2. Mild to moderate cortical volume loss and scattered small vessel ischemic microangiopathy. 3. Mild  partial opacification of the sphenoid sinus.   Electronically Signed   By: Garald Balding M.D.   On: 04/08/2014 18:15   Ct Soft Tissue Neck W Contrast  04/09/2014   CLINICAL DATA:  Throat cancer. Pain under the jaw. 50 pound weight loss over the last 6 weeks.  EXAM: CT NECK WITH CONTRAST  TECHNIQUE: Multidetector CT imaging of the neck was performed using the standard protocol following the bolus administration of intravenous contrast.  CONTRAST:  100 mL Omnipaque 300  COMPARISON:  CT neck with contrast 12/25/2013.  FINDINGS: Pharynx and larynx: Extensive irregular mucosal thickening is noted along the soft palate and palatine tonsil region bilaterally. This extends posteriorly on the left. No discrete mass lesion is present.  Patient is status post laryngectomy and bilateral modified radical neck dissection. Scar tissue is stable. There is no evidence for residual or recurrent tumor in the neck.  Salivary glands: Normal  bilaterally.  Thyroid: Minimal thyroid tissue remains on the left. The remainder of the thyroid has been resected.  Lymph nodes: No significant cervical adenopathy is present. Three 7 lingual lymph nodes again noted. These measure up to 7 mm.  Vascular: Apart from surgery, and the cervical vasculature is unremarkable.  Limited intracranial: A 14.5 mm meningioma of the planum sphenoidale is stable, measuring 15 mm. Limited imaging of the brain is otherwise unremarkable.  Visualized orbits: Normal bilaterally  Mastoids and visualized paranasal sinuses: Clear  Skeleton: Endplate degenerative changes are present throughout the cervical spine similar to the prior study. No focal lytic or blastic lesions are evident.  Upper chest: Minimal scarring is present at the lung apices. Atherosclerotic calcifications are present at the aortic arch. The lungs are otherwise clear.  IMPRESSION: 1. New irregular mucosal enhancement along the soft palate and extending to palatine tonsils bilaterally, more prominently on the left. This may be related to acute inflammation. Tumor recurrence is considered less likely but not excluded. Recommend direct visualization. 2. Laryngectomy noted. Bilateral modified radical neck dissection. There is no significant change in the postoperative appearance of the neck to suggest residual recurrent tumor more inferiorly in the neck. 3. No significant adenopathy in the neck. There are 3 sublingual nodes which are stable since the prior study but appears slightly more prominent than in 2012. 4. Degenerative changes are present within the cervical spine.   Electronically Signed   By: Lawrence Santiago M.D.   On: 04/09/2014 14:03   Ct Chest W Contrast  04/09/2014   CLINICAL DATA:  Throat cancer. Recent abrupt weight loss. Pain under the jaw.  EXAM: CT CHEST, ABDOMEN, AND PELVIS WITH CONTRAST  TECHNIQUE: Multidetector CT imaging of the chest, abdomen and pelvis was performed following the standard protocol  during bolus administration of intravenous contrast.  CONTRAST:  156mL OMNIPAQUE IOHEXOL 300 MG/ML  SOLN  COMPARISON:  05/12/2013  FINDINGS: CT CHEST FINDINGS  Mediastinum/Nodes: Atherosclerotic and double thickening in the left common carotid artery at the thoracic inlet. Atherosclerotic calcification of the aortic arch, descending thoracic aorta, left anterior descending, and circumflex coronary arteries.  No pathologic thoracic adenopathy.  Lungs/Pleura: Stable biapical pleural parenchymal scarring. Emphysema. Chronic faint scattered nodularity in both lungs, stable back through 2012 and accordingly considered benign. No worrisome or enlarging pulmonary nodule is observed. Mild scarring in the lingula. Old granulomatous disease noted.  Musculoskeletal:  There is new sclerosis in the T1 vertebral body with some inferior endplate concavity and mild heterogeneity of the internal density of T1. Remote inferior endplate compression at  T6.  CT ABDOMEN PELVIS FINDINGS  Hepatobiliary: Unremarkable  Pancreas: Abnormal clustered calcifications in the pancreatic head and uncinate process similar to prior and probably the result of prior chronic pancreatitis. Scant punctate calcifications along the parenchyma of the rest of the pancreas. No discrete pancreatic mass is identified nor is there is significant contour change compared to 05/12/13. However, the pancreatic calcifications are new compared to 11/14/2010.  Spleen: Unremarkable  Adrenals/Urinary Tract: Bilateral extrarenal pelvis. Punctate 2 mm left kidney lower pole nonobstructive calculus. 5 mm right kidney lower pole nonobstructive calculus. Several tiny hypodense renal lesions are technically nonspecific although statistically likely to be cysts. No hydronephrosis. Urinary bladder demonstrates mild wall thickening as before.  Stomach/Bowel: Unremarkable  Vascular/Lymphatic: Aortoiliac atherosclerotic vascular disease. 9 mm gastrohepatic ligament lymph node, stable.   Reproductive: Unremarkable  Other: No supplemental non-categorized findings.  Musculoskeletal: There is evidence of chronic bilateral avascular necrosis of the femoral heads without collapse. Irregularity in the S4 segment of the sacrum compatible with remote healed fracture.  IMPRESSION: 1. New sclerosis in the T1 vertebral body with some inferior endplate concavity ; this could represent a mild inferior endplate compression fracture, with bony metastatic disease and less likely differential diagnostic consideration. 2. Considerable atherosclerosis in the chest, abdomen, and pelvis. 3. Emphysema. 4. Chronic mild nodularity in both lungs, stable from 2012 and accordingly considered benign. 5. Pancreatic calcifications, most clustered in the pancreatic head and uncinate process, likely from prior chronic pancreatitis. These were present in 2015 but not in 2012. 6. Bilateral nonobstructive renal calculi. 7. 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   Electronically Signed   By: Sherryl Barters M.D.   On: 04/09/2014 13:38   Ct Abdomen Pelvis W Contrast  04/09/2014   CLINICAL DATA:  Throat cancer. Recent abrupt weight loss. Pain under the jaw.  EXAM: CT CHEST, ABDOMEN, AND PELVIS WITH CONTRAST  TECHNIQUE: Multidetector CT imaging of the chest, abdomen and pelvis was performed following the standard protocol during bolus administration of intravenous contrast.  CONTRAST:  14mL OMNIPAQUE IOHEXOL 300 MG/ML  SOLN  COMPARISON:  05/12/2013  FINDINGS: CT CHEST FINDINGS  Mediastinum/Nodes: Atherosclerotic and double thickening in the left common carotid artery at the thoracic inlet. Atherosclerotic calcification of the aortic arch, descending thoracic aorta, left anterior descending, and circumflex coronary arteries.  No pathologic thoracic adenopathy.  Lungs/Pleura: Stable biapical pleural parenchymal scarring. Emphysema. Chronic faint scattered nodularity in both lungs, stable back through 2012 and  accordingly considered benign. No worrisome or enlarging pulmonary nodule is observed. Mild scarring in the lingula. Old granulomatous disease noted.  Musculoskeletal:  There is new sclerosis in the T1 vertebral body with some inferior endplate concavity and mild heterogeneity of the internal density of T1. Remote inferior endplate compression at T6.  CT ABDOMEN PELVIS FINDINGS  Hepatobiliary: Unremarkable  Pancreas: Abnormal clustered calcifications in the pancreatic head and uncinate process similar to prior and probably the result of prior chronic pancreatitis. Scant punctate calcifications along the parenchyma of the rest of the pancreas. No discrete pancreatic mass is identified nor is there is significant contour change compared to 05/12/13. However, the pancreatic calcifications are new compared to 11/14/2010.  Spleen: Unremarkable  Adrenals/Urinary Tract: Bilateral extrarenal pelvis. Punctate 2 mm left kidney lower pole nonobstructive calculus. 5 mm right kidney lower pole nonobstructive calculus. Several tiny hypodense renal lesions are technically nonspecific although statistically likely to be cysts. No hydronephrosis. Urinary bladder demonstrates mild wall thickening as before.  Stomach/Bowel: Unremarkable  Vascular/Lymphatic: Aortoiliac atherosclerotic  vascular disease. 9 mm gastrohepatic ligament lymph node, stable.  Reproductive: Unremarkable  Other: No supplemental non-categorized findings.  Musculoskeletal: There is evidence of chronic bilateral avascular necrosis of the femoral heads without collapse. Irregularity in the S4 segment of the sacrum compatible with remote healed fracture.  IMPRESSION: 1. New sclerosis in the T1 vertebral body with some inferior endplate concavity ; this could represent a mild inferior endplate compression fracture, with bony metastatic disease and less likely differential diagnostic consideration. 2. Considerable atherosclerosis in the chest, abdomen, and pelvis. 3.  Emphysema. 4. Chronic mild nodularity in both lungs, stable from 2012 and accordingly considered benign. 5. Pancreatic calcifications, most clustered in the pancreatic head and uncinate process, likely from prior chronic pancreatitis. These were present in 2015 but not in 2012. 6. Bilateral nonobstructive renal calculi. 7. 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   Electronically Signed   By: Sherryl Barters M.D.   On: 04/09/2014 13:38   Upper Endoscopy 04/10/14: Apparent hypopharyngeal mass, no extension into the esophagus where there is some chronic proximal fibrosis with slight narrowing.  Pathology taken.  Pathology results 04/10/14: Squamous cell carcinoma.  The vast majority of the carcinoma is in situ, although there are a few foci suspicious for early invasion.  Results/Tests Pending at Time of Discharge: none  Discharge Medications:    Medication List    STOP taking these medications        megestrol 40 MG/ML suspension  Commonly known as:  MEGACE     traMADol 50 MG tablet  Commonly known as:  ULTRAM      TAKE these medications        amLODipine 5 MG tablet  Commonly known as:  NORVASC  Take 1 tablet (5 mg total) by mouth daily. NEEDS OFFICE VISIT     atorvastatin 20 MG tablet  Commonly known as:  LIPITOR  Take 1 tablet (20 mg total) by mouth daily.     bisoprolol 10 MG tablet  Commonly known as:  ZEBETA  TAKE 1 TABLET BY MOUTH EVERY DAY     diclofenac sodium 1 % Gel  Commonly known as:  VOLTAREN  Apply 2 g topically 4 (four) times daily.     guaiFENesin 600 MG 12 hr tablet  Commonly known as:  MUCINEX  Take 600 mg by mouth daily as needed for cough or to loosen phlegm.     hydrALAZINE 25 MG tablet  Commonly known as:  APRESOLINE  TAKE 2 BY MOUTH TWICE DAILY AT LUNCH AND AFTER DINNER. Follow up with PCP.     levothyroxine 200 MCG tablet  Commonly known as:  SYNTHROID, LEVOTHROID  Take 1 tablet (200 mcg total) by mouth daily before  breakfast.     lipase/protease/amylase 12000 UNITS Cpep capsule  Commonly known as:  CREON  Take 1 capsule by mouth 3 (three) times daily before meals.     lisinopril 10 MG tablet  Commonly known as:  PRINIVIL,ZESTRIL  Take 1 tablet (10 mg total) by mouth daily.     magic mouthwash w/lidocaine Soln  Take 5 mLs by mouth 3 (three) times daily.     ondansetron 4 MG tablet  Commonly known as:  ZOFRAN  Take 1 tablet (4 mg total) by mouth every 8 (eight) hours as needed for nausea or vomiting.     oxyCODONE 5 MG/5ML solution  Commonly known as:  ROXICODONE  Take 5 mLs (5 mg total) by mouth every 4 (four) hours as needed for  severe pain.     pantoprazole 40 MG tablet  Commonly known as:  PROTONIX  Take 1 tablet (40 mg total) by mouth at bedtime.        Discharge Instructions: Please refer to Patient Instructions section of EMR for full details.  Patient was counseled important signs and symptoms that should prompt return to medical care, changes in medications, dietary instructions, activity restrictions, and follow up appointments.   Follow-Up Appointments: Follow-up Information    Follow up with Lorna Few, DO. Go on 04/24/2014.   Specialty:  Family Medicine   Why:  2:00 pm (hospital follow up)   Contact information:   [address] [place] [address] [phone]       Follow up with Francina Ames, MD. Go on 04/15/2014.   Specialty:  Otolaryngology   Why:  2:15pm   Contact information:   [place] [address] [place], DO 04/14/2014, 1:30 PM PGY-1, [employer]

## 2014-04-11 NOTE — Consult Note (Signed)
Patient IO:[name]      DOB: 1950-07-30      FGH:[id number]     Consult Note from the Palliative Medicine Team at [facility] Requested by: Dr Lajuana Ripple     PCP: Howard Pouch, DO [facility] [place]    Phone [phone]  Assessment of patients Current state: 64 yo male with PMHx of laryngeal CA s/p resection in 2011 (per [facility] ENT note), HTN, CHF, etoh abuse.  Presented with LOC and weight loss w/odynophagia  1.  Code Status: Full, did not discuss  2. GOC: Had good discussion with [name] today. I know teams have been talking to him as well.  He seemed a little surprised to here there is concern for cancer recurrence with his hypopharyngeal mass, but it seems almost certain this has been mentioned.  He knows we are awaiting bx results.  If malignancy found, he would be interested in seeing his ENT surgeon at [facility] (Dr Fenton Malling) whom he last saw in 05/2013. Of note at that time, he was being worked up for weight loss by ENT. Does not appear that they found cause. Certainly concerning is his ongoing weight loss and concern for T1 metastatic lesion but more information needed. Complicating things is his ongoing etoh abuse.  There is mention of cirrhosis in his chart but I do not see evidence of this on his abdominal imaging dating back to 2013.  At this point I think once we have tissue diagnosis, he needs to here from specialists ENT/Onc so he can make informed decisions about treatments and goals of care. It does not sound like he is taking any treatment decisions off the table at this time, including repeat surgery. If malignancy found would also try to get him referral back to his ENT at [facility].   3. Symptom Management:   1. Odynophagia: Limiting his PO intake.  Has MM moutwash. I will prescribe low dose liquid oxycodone to see if topical effect of opioids has benefit (there is some literature to suggest so). He has allergy to some pain meds but tells me he  tolerated oxycodone in the past without problem.  If going home with this, will need to caution more on etoh use with opioids.    4. Psychosocial/Spiritual: 4 children and currently lives at home with his daughter in [place]. Originally from [place].    Brief HPI: 64 yo male with PMHx of laryngeal CA s/p laryngectomy, CHF, etoh abuse who presented on 04/08/14 with loss of consciousness in setting of weight loss and odynophagia. He has seen ENT at [facility] for weight loss issues last year as well.  Ultimately, here, his imaging revealed concerns for  Hypopharyngeal mass which was also noted on EGD and subsequently biopsied. He has had direct visualization of his oropharynx by ENT on 12/15 and 1/23 without evidence of significant mass prior to this EGD.  Palliative care was consulted to assist in setting goals of care.      PMH:  Past Medical History  Diagnosis Date  . Hypertension   . Cirrhosis of liver   . Hypothyroidism   . Alcohol abuse   . CHF (congestive heart failure)     EF 45-50% 05/2012  . Hyperlipidemia   . Arthritis   . Laryngeal cancer 2011    Laryngectomy, [id number]     PSH: Past Surgical History  Procedure Laterality Date  . Tracheal surgery    . Tracheostomy    .  Left and right heart catheterization with coronary angiogram N/A 12/28/2011    Procedure: LEFT AND RIGHT HEART CATHETERIZATION WITH CORONARY ANGIOGRAM;  Surgeon: Jolaine Artist, MD;  Location: [facility] CATH LAB;  Service: Cardiovascular;  Laterality: N/A;   I have reviewed the FH and SH and  If appropriate update it with new information. Allergies  Allergen Reactions  . Vicodin [Hydrocodone-Acetaminophen] Rash  . Tylenol [Acetaminophen]     HBP -Liver Sorosis   Scheduled Meds: . bisoprolol  10 mg Oral Daily  . feeding supplement (ENSURE COMPLETE)  237 mL Oral TID BM  . folic acid  1 mg Oral Daily  . heparin  5,000 Units Subcutaneous 3 times per day  . hydrALAZINE  25 mg Oral BID  . Influenza vac  split quadrivalent PF  0.5 mL Intramuscular Tomorrow-1000  . [START ON 04/13/2014] levothyroxine  200 mcg Oral QAC breakfast  . lipase/protease/amylase  1 capsule Oral TID AC  . lisinopril  10 mg Oral Daily  . LORazepam  0-4 mg Intravenous Q12H  . multivitamin with minerals  1 tablet Oral Daily  . nystatin  5 mL Oral QID  . pantoprazole  40 mg Oral QHS  . potassium chloride  40 mEq Oral BID  . sodium chloride  3 mL Intravenous Q12H  . thiamine  100 mg Oral Daily   Or  . thiamine  100 mg Intravenous Daily   Continuous Infusions:  PRN Meds:.hydrALAZINE, LORazepam **OR** LORazepam, magic mouthwash, ondansetron    BP 137/83 mmHg  Pulse 98  Temp(Src) 99.3 F (37.4 C) (Oral)  Resp 18  Wt 50.168 kg (110 lb 9.6 oz)  SpO2 100%     Intake/Output Summary (Last 24 hours) at 04/11/14 1222 Last data filed at 04/11/14 0838  Gross per 24 hour  Intake    577 ml  Output    775 ml  Net   -198 ml   Physical Exam:  General: Alert, NAD HEENT:  [place], sclera anicteric Neck: supple, post-surgical changes CVS: regular rate   Labs: CBC    Component Value Date/Time   WBC 4.6 04/11/2014 0755   WBC 4.3 11/04/2009 1559   RBC 3.86* 04/11/2014 0755   RBC 4.17* 11/04/2009 1559   HGB 10.0* 04/11/2014 0755   HGB 12.4* 11/04/2009 1559   HCT 30.1* 04/11/2014 0755   HCT 38.0* 11/04/2009 1559   PLT 168 04/11/2014 0755   PLT 169 11/04/2009 1559   MCV 78.0 04/11/2014 0755   MCV 91.2 11/04/2009 1559   MCH 25.9* 04/11/2014 0755   MCHC 33.2 04/11/2014 0755   MCHC 32.6 11/04/2009 1559   RDW 16.2* 04/11/2014 0755   RDW 17.2* 11/04/2009 1559   LYMPHSABS 1.4 04/08/2014 1825   LYMPHSABS 2.1 11/04/2009 1559   MONOABS 0.6 04/08/2014 1825   MONOABS 0.4 11/04/2009 1559   EOSABS 0.1 04/08/2014 1825   EOSABS 0.1 11/04/2009 1559   BASOSABS 0.0 04/08/2014 1825   BASOSABS 0.0 11/04/2009 1559    BMET    Component Value Date/Time   NA 135 04/11/2014 0755   K 3.2* 04/11/2014 0755   CL 102 04/11/2014  0755   CO2 25 04/11/2014 0755   GLUCOSE 104* 04/11/2014 0755   BUN <5* 04/11/2014 0755   CREATININE 0.80 04/11/2014 0755   CREATININE 0.70 06/10/2013 1043   CALCIUM 8.6 04/11/2014 0755   GFRNONAA >90 04/11/2014 0755   GFRAA >90 04/11/2014 0755    CMP     Component Value Date/Time   NA 135 04/11/2014  0755   K 3.2* 04/11/2014 0755   CL 102 04/11/2014 0755   CO2 25 04/11/2014 0755   GLUCOSE 104* 04/11/2014 0755   BUN <5* 04/11/2014 0755   CREATININE 0.80 04/11/2014 0755   CREATININE 0.70 06/10/2013 1043   CALCIUM 8.6 04/11/2014 0755   PROT 8.2 04/08/2014 1825   ALBUMIN 3.6 04/08/2014 1825   AST 18 04/08/2014 1825   ALT 10 04/08/2014 1825   ALKPHOS 84 04/08/2014 1825   BILITOT 1.5* 04/08/2014 1825   GFRNONAA >90 04/11/2014 0755   GFRAA >90 04/11/2014 0755   2/3 CT Head  IMPRESSION: 1. No acute intracranial pathology seen on CT. 2. Mild to moderate cortical volume loss and scattered small vessel ischemic microangiopathy. 3. Mild partial opacification of the sphenoid sinus.  2/4 CT soft tissue/neck IMPRESSION: 1. New irregular mucosal enhancement along the soft palate and extending to palatine tonsils bilaterally, more prominently on the left. This may be related to acute inflammation. Tumor recurrence is considered less likely but not excluded. Recommend direct visualization. 2. Laryngectomy noted. Bilateral modified radical neck dissection. There is no significant change in the postoperative appearance of the neck to suggest residual recurrent tumor more inferiorly in the neck. 3. No significant adenopathy in the neck. There are 3 sublingual nodes which are stable since the prior study but appears slightly more prominent than in 2012. 4. Degenerative changes are present within the cervical spine.    2/4 CT Abd/Pelvis/Chest/Neck IMPRESSION: 1. New sclerosis in the T1 vertebral body with some inferior endplate concavity ; this could represent a mild inferior  endplate compression fracture, with bony metastatic disease and less likely differential diagnostic consideration. 2. Considerable atherosclerosis in the chest, abdomen, and pelvis. 3. Emphysema. 4. Chronic mild nodularity in both lungs, stable from 2012 and accordingly considered benign. 5. Pancreatic calcifications, most clustered in the pancreatic head and uncinate process, likely from prior chronic pancreatitis. These were present in 2015 but not in 2012. 6. Bilateral nonobstructive renal calculi. 7. 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   Total Time: 45 minutes Greater than 50%  of this time was spent counseling and coordinating care related to the above assessment and plan.  Doran Clay D.O. Palliative Medicine Team at [facility]  Pager: [phone] Team Phone: [phone]

## 2014-04-11 NOTE — Progress Notes (Signed)
Family Medicine Teaching Service Daily Progress Note Intern Pager: [phone]  Patient name: [name] Medical record number: [id number] Date of birth: May 26, 1950 Age: 64 y.o. Gender: male  Primary Care Provider: Howard Pouch, DO Consultants: none Code Status: FULL (discussed on admission)  Pt Overview and Major Events to Date:    Assessment and Plan: [name] is a 64 y.o. male presenting with an acute LOC after poor PO intake over the last 5 days. PMH is significant for laryngeal cancer removed 2009, sCHF EF 45-50%, HTN, cirrhosis of the liver, chronic pancreatitis and ETOH dependence.   #Loss of Consciousness: Neuro exam unrevealing. CXR no acute processes. CT head no acute processes. CBC unremarkable.No h/o seizures. EKG no ischemic changes. DDx: hypoglycemia (poor PO intake) vs dehydration vs arrhythmia vs orthostasis vs new onset seizures (reports loss of urine) vs stroke (no neuro deficits on exam). OVS POSITIVE. Trops neg x3. Carotid dopplers with R 1-39% and L 40-59% stenosis, anterograde flow. EKG no ischemic changes. Echo: Grade 1 Diastolic dysfxn w/ EF 26-94% trace MR and TR. -telemetry -on seizure precautions.  C/s to neuro if recurrence of seizure like activity  #Poor oral intake 2/2 throat pain: Exudate appreciated on exam. Strep (rapid negative) vs o/p candidiasis vs esophagitis.  Responded well to MMW w/ lido. A1c 5.8. Endoscopy with evidence of new lesion.  HIV negative -Strep cx pending -Nystatin suspension QID -Will switch MMW w/lidocaine to MMW (WITHOUT lidocaine) and have patient swallow in efforts to soothe esophagus  -gentle hydration w/ IVFs -Consider SLP evaluation -c/s to GI, appreciate recs -c/s to Palliative this am.  Patient agreeable to c/s.  #HTN: uncontrolled BP on admission 198/106. On Hydralazine, bisoprolol an lisinopril at home. BP elevated overnight 126/72 this am.  -Continue home Bisoprolol and Lisinopril & Hydralazine  #Weight  loss: 23 lb weight loss since 07/2013. Appears very thin/malnourished on exam. Reports never having colonoscopy.  Concerning in setting of h/o laryngeal cancer (recurrence vs mets vs new primary cancer) FOBT neg. CT chest/abd with pancreatic calcifications. Also a compression fracture with poss mets @T1 .  Continued thickening of bladder wall. CT soft tissue neck: new irregular mucosal enhancement along the soft palate and extending to palatine tonsils bilaterally, more prominently on the left. This may be related to acute inflammation vs new malignancy -Recommend outpatient/inpatient colonoscopy -Consider c/s to urology -c/s to palliative  #Hypokalemia: K3.4>3.1>3.1>3.2.  Mg 1.6 -KDur 60meq -monitor and replete PRN  #Systolic CHF: last echo 8546, moderate LVH, EF 45-50%, w/ grade 1 diastolic dysfunction -Repeat echo  -Will be cautious with fluids  #h/o laryngeal cancer with resection 2009. Reportedly "cleared" by ENT however no records from [facility] since 05/2013 -Concern for weight loss over last year.  #Cirrhosis of liver: LFTs WNL. Liver not palpable on exam.  #ETOH dependence: Last reported drink 2 weeks ago. Usually drinks "1 pint" over 2 weeks. No evidence of withdrawal on admission -CIWA protocol -MVI, Thiamine  #Chronic pancreatitis: Creon at home. Lipase 18 -Continue home medication  #Hypothyroidism: Synthroid 250 mcg @ home TSH <0.01 -hold synthroid, consider decreasing to 291mcg in a couple of days  #Hyperlipidemia: Lipitor 20mg  @ home -Continue home medication  FEN/GI: NS @75  cc/h, protonix, Clear liquid diet Prophylaxis: sub-q heparin  Disposition: Discharge pending improvement in PO and evaluation of LOC & weight loss work up  Subjective:  Patient continues to endorse throat pain but more pronounced now deeper in throat.  Does endorse moderate relief in the oral cavity with MMW.  Palliative care consult discussed for [place].  Patient states that he is not interested in  surgical treatments at this time but is agreeable to seeing [facility] outpatient ENT for further evaluation.  Patient is eager to eat.  When asked about his emotion/mental wellbeing patient reports he is doing well in setting of possible recurrence of cancer.  Objective: Temp:  [98.8 F (37.1 C)-100.2 F (37.9 C)] 99.3 F (37.4 C) (02/06 0941) Pulse Rate:  [96-112] 98 (02/06 0941) Resp:  [16-18] 18 (02/06 0941) BP: (126-195)/(72-108) 137/83 mmHg (02/06 0941) SpO2:  [100 %] 100 % (02/06 0941)   Physical Exam: General: awake, alert, lying in bed, NAD Cardiovascular: RRR, no m/r/g Respiratory: CTAB, no increased WOB, no wheeze Abdomen: flat, soft, NT/ND, +BS Extremities: thin, warm, no edema Neuro: follows commands, no focal neurological deficits, is not vocal but is able to nod to questions and mouth words.  Laboratory:  Recent Labs Lab 04/09/14 0620 04/10/14 0656 04/11/14 0755  WBC 5.4 3.6* 4.6  HGB 9.7* 10.7* 10.0*  HCT 29.6* 32.3* 30.1*  PLT 209 177 168    Recent Labs Lab 04/08/14 1825 04/09/14 0620 04/10/14 0656 04/11/14 0755  NA 144 143 139 135  K 3.4* 3.1* 3.1* 3.2*  CL 107 113* 110 102  CO2 21 20 25 25   BUN 21 13 6  <5*  CREATININE 1.07 1.00 0.79 0.80  CALCIUM 9.7 8.9 8.8 8.6  PROT 8.2  --   --   --   BILITOT 1.5*  --   --   --   ALKPHOS 84  --   --   --   ALT 10  --   --   --   AST 18  --   --   --   GLUCOSE 80 75 100* 104*   Lipase 18  Urinalysis    Component Value Date/Time   COLORURINE RED* 04/09/2014 0018   APPEARANCEUR TURBID* 04/09/2014 0018   LABSPEC 1.021 04/09/2014 0018   PHURINE 5.5 04/09/2014 0018   GLUCOSEU NEGATIVE 04/09/2014 0018   HGBUR LARGE* 04/09/2014 0018   BILIRUBINUR MODERATE* 04/09/2014 0018   KETONESUR 40* 04/09/2014 0018   PROTEINUR 100* 04/09/2014 0018   UROBILINOGEN 1.0 04/09/2014 0018   NITRITE NEGATIVE 04/09/2014 0018   LEUKOCYTESUR TRACE* 04/09/2014 0018    Imaging/Diagnostic Tests: Ct Soft Tissue Neck W  Contrast  04/09/2014   CLINICAL DATA:  Throat cancer. Pain under the jaw. 50 pound weight loss over the last 6 weeks.  EXAM: CT NECK WITH CONTRAST  TECHNIQUE: Multidetector CT imaging of the neck was performed using the standard protocol following the bolus administration of intravenous contrast.  CONTRAST:  100 mL Omnipaque 300  COMPARISON:  CT neck with contrast 12/25/2013.  FINDINGS: Pharynx and larynx: Extensive irregular mucosal thickening is noted along the soft palate and palatine tonsil region bilaterally. This extends posteriorly on the left. No discrete mass lesion is present.  Patient is status post laryngectomy and bilateral modified radical neck dissection. Scar tissue is stable. There is no evidence for residual or recurrent tumor in the neck.  Salivary glands: Normal bilaterally.  Thyroid: Minimal thyroid tissue remains on the left. The remainder of the thyroid has been resected.  Lymph nodes: No significant cervical adenopathy is present. Three 7 lingual lymph nodes again noted. These measure up to 7 mm.  Vascular: Apart from surgery, and the cervical vasculature is unremarkable.  Limited intracranial: A 14.5 mm meningioma of the planum sphenoidale is stable, measuring 15 mm. Limited  imaging of the brain is otherwise unremarkable.  Visualized orbits: Normal bilaterally  Mastoids and visualized paranasal sinuses: Clear  Skeleton: Endplate degenerative changes are present throughout the cervical spine similar to the prior study. No focal lytic or blastic lesions are evident.  Upper chest: Minimal scarring is present at the lung apices. Atherosclerotic calcifications are present at the aortic arch. The lungs are otherwise clear.  IMPRESSION: 1. New irregular mucosal enhancement along the soft palate and extending to palatine tonsils bilaterally, more prominently on the left. This may be related to acute inflammation. Tumor recurrence is considered less likely but not excluded. Recommend direct  visualization. 2. Laryngectomy noted. Bilateral modified radical neck dissection. There is no significant change in the postoperative appearance of the neck to suggest residual recurrent tumor more inferiorly in the neck. 3. No significant adenopathy in the neck. There are 3 sublingual nodes which are stable since the prior study but appears slightly more prominent than in 2012. 4. Degenerative changes are present within the cervical spine.   Electronically Signed   By: Lawrence Santiago M.D.   On: 04/09/2014 14:03   Ct Chest W Contrast  04/09/2014   CLINICAL DATA:  Throat cancer. Recent abrupt weight loss. Pain under the jaw.  EXAM: CT CHEST, ABDOMEN, AND PELVIS WITH CONTRAST  TECHNIQUE: Multidetector CT imaging of the chest, abdomen and pelvis was performed following the standard protocol during bolus administration of intravenous contrast.  CONTRAST:  111mL OMNIPAQUE IOHEXOL 300 MG/ML  SOLN  COMPARISON:  05/12/2013  FINDINGS: CT CHEST FINDINGS  Mediastinum/Nodes: Atherosclerotic and double thickening in the left common carotid artery at the thoracic inlet. Atherosclerotic calcification of the aortic arch, descending thoracic aorta, left anterior descending, and circumflex coronary arteries.  No pathologic thoracic adenopathy.  Lungs/Pleura: Stable biapical pleural parenchymal scarring. Emphysema. Chronic faint scattered nodularity in both lungs, stable back through 2012 and accordingly considered benign. No worrisome or enlarging pulmonary nodule is observed. Mild scarring in the lingula. Old granulomatous disease noted.  Musculoskeletal:  There is new sclerosis in the T1 vertebral body with some inferior endplate concavity and mild heterogeneity of the internal density of T1. Remote inferior endplate compression at T6.  CT ABDOMEN PELVIS FINDINGS  Hepatobiliary: Unremarkable  Pancreas: Abnormal clustered calcifications in the pancreatic head and uncinate process similar to prior and probably the result of prior  chronic pancreatitis. Scant punctate calcifications along the parenchyma of the rest of the pancreas. No discrete pancreatic mass is identified nor is there is significant contour change compared to 05/12/13. However, the pancreatic calcifications are new compared to 11/14/2010.  Spleen: Unremarkable  Adrenals/Urinary Tract: Bilateral extrarenal pelvis. Punctate 2 mm left kidney lower pole nonobstructive calculus. 5 mm right kidney lower pole nonobstructive calculus. Several tiny hypodense renal lesions are technically nonspecific although statistically likely to be cysts. No hydronephrosis. Urinary bladder demonstrates mild wall thickening as before.  Stomach/Bowel: Unremarkable  Vascular/Lymphatic: Aortoiliac atherosclerotic vascular disease. 9 mm gastrohepatic ligament lymph node, stable.  Reproductive: Unremarkable  Other: No supplemental non-categorized findings.  Musculoskeletal: There is evidence of chronic bilateral avascular necrosis of the femoral heads without collapse. Irregularity in the S4 segment of the sacrum compatible with remote healed fracture.  IMPRESSION: 1. New sclerosis in the T1 vertebral body with some inferior endplate concavity ; this could represent a mild inferior endplate compression fracture, with bony metastatic disease and less likely differential diagnostic consideration. 2. Considerable atherosclerosis in the chest, abdomen, and pelvis. 3. Emphysema. 4. Chronic mild nodularity in both lungs,  stable from 2012 and accordingly considered benign. 5. Pancreatic calcifications, most clustered in the pancreatic head and uncinate process, likely from prior chronic pancreatitis. These were present in 2015 but not in 2012. 6. Bilateral nonobstructive renal calculi. 7. 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   Electronically Signed   By: Sherryl Barters M.D.   On: 04/09/2014 13:38   Ct Abdomen Pelvis W Contrast  04/09/2014   CLINICAL DATA:  Throat cancer. Recent abrupt  weight loss. Pain under the jaw.  EXAM: CT CHEST, ABDOMEN, AND PELVIS WITH CONTRAST  TECHNIQUE: Multidetector CT imaging of the chest, abdomen and pelvis was performed following the standard protocol during bolus administration of intravenous contrast.  CONTRAST:  118mL OMNIPAQUE IOHEXOL 300 MG/ML  SOLN  COMPARISON:  05/12/2013  FINDINGS: CT CHEST FINDINGS  Mediastinum/Nodes: Atherosclerotic and double thickening in the left common carotid artery at the thoracic inlet. Atherosclerotic calcification of the aortic arch, descending thoracic aorta, left anterior descending, and circumflex coronary arteries.  No pathologic thoracic adenopathy.  Lungs/Pleura: Stable biapical pleural parenchymal scarring. Emphysema. Chronic faint scattered nodularity in both lungs, stable back through 2012 and accordingly considered benign. No worrisome or enlarging pulmonary nodule is observed. Mild scarring in the lingula. Old granulomatous disease noted.  Musculoskeletal:  There is new sclerosis in the T1 vertebral body with some inferior endplate concavity and mild heterogeneity of the internal density of T1. Remote inferior endplate compression at T6.  CT ABDOMEN PELVIS FINDINGS  Hepatobiliary: Unremarkable  Pancreas: Abnormal clustered calcifications in the pancreatic head and uncinate process similar to prior and probably the result of prior chronic pancreatitis. Scant punctate calcifications along the parenchyma of the rest of the pancreas. No discrete pancreatic mass is identified nor is there is significant contour change compared to 05/12/13. However, the pancreatic calcifications are new compared to 11/14/2010.  Spleen: Unremarkable  Adrenals/Urinary Tract: Bilateral extrarenal pelvis. Punctate 2 mm left kidney lower pole nonobstructive calculus. 5 mm right kidney lower pole nonobstructive calculus. Several tiny hypodense renal lesions are technically nonspecific although statistically likely to be cysts. No hydronephrosis.  Urinary bladder demonstrates mild wall thickening as before.  Stomach/Bowel: Unremarkable  Vascular/Lymphatic: Aortoiliac atherosclerotic vascular disease. 9 mm gastrohepatic ligament lymph node, stable.  Reproductive: Unremarkable  Other: No supplemental non-categorized findings.  Musculoskeletal: There is evidence of chronic bilateral avascular necrosis of the femoral heads without collapse. Irregularity in the S4 segment of the sacrum compatible with remote healed fracture.  IMPRESSION: 1. New sclerosis in the T1 vertebral body with some inferior endplate concavity ; this could represent a mild inferior endplate compression fracture, with bony metastatic disease and less likely differential diagnostic consideration. 2. Considerable atherosclerosis in the chest, abdomen, and pelvis. 3. Emphysema. 4. Chronic mild nodularity in both lungs, stable from 2012 and accordingly considered benign. 5. Pancreatic calcifications, most clustered in the pancreatic head and uncinate process, likely from prior chronic pancreatitis. These were present in 2015 but not in 2012. 6. Bilateral nonobstructive renal calculi. 7. 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   Electronically Signed   By: Sherryl Barters M.D.   On: 04/09/2014 13:38    Janora Norlander, DO 04/11/2014, 10:14 AM PGY-1, [place] Intern pager: [phone], text pages welcome

## 2014-04-12 DIAGNOSIS — K229 Disease of esophagus, unspecified: Secondary | ICD-10-CM | POA: Diagnosis not present

## 2014-04-12 LAB — BASIC METABOLIC PANEL
Anion gap: 3 — ABNORMAL LOW (ref 5–15)
BUN: <5 mg/dL — ABNORMAL LOW (ref 6–23)
CALCIUM: 8.8 mg/dL (ref 8.4–10.5)
CHLORIDE: 101 mmol/L (ref 96–112)
CO2: 30 mmol/L (ref 19–32)
Creatinine, Ser: 0.84 mg/dL (ref 0.50–1.35)
GFR calc non Af Amer: >90 mL/min (ref >90)
Glucose, Bld: 102 mg/dL — ABNORMAL HIGH (ref 70–99)
Potassium: 4.3 mmol/L (ref 3.5–5.1)
Sodium: 134 mmol/L — ABNORMAL LOW (ref 135–145)

## 2014-04-12 MED ORDER — FOLIC ACID 1 MG PO TABS
1.0000 mg | ORAL_TABLET | Freq: Every day | ORAL | Status: DC
  Administered 2014-04-12 – 2014-04-14 (×3): 1 mg via ORAL
  Filled 2014-04-12 (×3): qty 1

## 2014-04-12 MED ORDER — THIAMINE HCL 100 MG/ML IJ SOLN
100.0000 mg | Freq: Every day | INTRAMUSCULAR | Status: DC
Start: 2014-04-12 — End: 2014-04-13
  Filled 2014-04-12 (×2): qty 1

## 2014-04-12 MED ORDER — LORAZEPAM 1 MG PO TABS: 1.0000 mg | ORAL_TABLET | Freq: Four times a day (QID) | ORAL | Status: DC | PRN

## 2014-04-12 MED ORDER — VITAMIN B-1 100 MG PO TABS
100.0000 mg | ORAL_TABLET | Freq: Every day | ORAL | Status: DC
  Administered 2014-04-12 – 2014-04-14 (×3): 100 mg via ORAL
  Filled 2014-04-12 (×3): qty 1

## 2014-04-12 MED ORDER — LORAZEPAM 2 MG/ML IJ SOLN: 1.0000 mg | Freq: Four times a day (QID) | INTRAMUSCULAR | Status: DC | PRN

## 2014-04-12 MED ORDER — ADULT MULTIVITAMIN W/MINERALS CH
1.0000 | ORAL_TABLET | Freq: Every day | ORAL | Status: DC
  Administered 2014-04-12 – 2014-04-14 (×3): 1 via ORAL
  Filled 2014-04-12 (×3): qty 1

## 2014-04-12 NOTE — Progress Notes (Signed)
Family Medicine Teaching Service Daily Progress Note Intern Pager: [phone]  Patient name: [name] Medical record number: [id number] Date of birth: 14-Mar-1950 Age: 64 y.o. 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 Simeon Craft) recommending waiting for pathology result and [facility] ENT / onc follow-up 2/7: pain control slowly helping, PO intake still relatively poor  Assessment and Plan: [name] is a 64 y.o. male presenting with an acute LOC after poor PO intake over the last 5 days. PMH is significant for laryngeal cancer removed 2009, sCHF EF 45-50%, HTN, cirrhosis of the liver, chronic pancreatitis and ETOH dependence.   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 - biopsy pending -continue nystatin suspension 4 times daily, continue magic mouthwash 4 times daily PRN - appreciate GI and ENT recs; awaiting path, may need oncology f/u and will definitely need [facility] ENT f/u (pt will need to call, [phone]) - appreciate palliative care consult; continue oxycodone liquid for topical effect, magic mouthwash  #Hx laryngeal cancer with resection 2009; Endoscopy 2/4 with evidence of new lesion (see above) - Reportedly "cleared" by ENT however no records from [facility] since 05/2013 - Concern for weight loss over last year; possibly related to the above - will need outpt f/u, as above  #Poor oral intake 2/2 throat pain - slightly improved #Loss of Consciousness: Now resolved. Prior to admit with confusion after regaining consciousness but no seizure-like activity otherwise, no recurrence so far  while admitted.  - Neuro exam unrevealing but orthostatic on admission.CXR no acute processes.CT head no acute processes.CBC unremarkable. EKG non-ischemic, troponins negative. No hx of seizure. Broad DDx, but most likely believed to be related poor PO intake due to pain. - Carotid dopplers with R 1-39% and L 40-59% stenosis, anterograde flow; repeat echo as below (stable from 2014) - continue telemetry, seizure precautions; consider consult to neuro if any seizure-like activity - Exudate appreciated on exam on admission.Consideration for strep (rapid negative, culture negative) vs oropharyngeal candidiasis or esophagitis. HIV negative.   #Weight loss: 23 lb weight loss since 07/2013, very thin/malnourished on exam. Weight variable here (104 on 2/4 one scale, 110 lb on second scale; 111 on 2/6) - A1c 5.8 - Concerning for relation to malignancy in setting of h/o laryngeal cancer with ?new hypopharyngeal mass (recurrence vs mets vs new primary cancer), as related to the above - FOBT neg. Reports never having colonoscopy. - will need outpt f/u, as above  #HTN: uncontrolled BP on admission 198/106. On hydralazine 50 mg BID, bisoprolol 10 mg daily, and lisinopril 10 mg daily at home. - Continue bisoprolol, lisinopril & hydralazine 25 mg BID - continue hydralazine 10 mg q4 PRN  #Hypokalemia: mild, with  Mg 1.6 on 2/5 -monitor and replete PRN  #Systolic CHF: echo shows EF 50-55%, focal basal hypertrophy of septum, and grade 1 LV diastolic dysfunction (previously moderate LVH, EF 45-50%, w/ grade 1 diastolic dysfunction in 6213)  #Cirrhosis of liver: LFTs WNL, liver not palpably enlarged  #ETOH dependence: Last reported drink 2 weeks ago. Usually drinks "1 pint" over 2 weeks. No evidence of withdrawal on admission -continue CIWA  protocol, multivitamin, thiamine  #Chronic pancreatitis: on Creon at home. Lipase 18 on 2/3 -Continue home medication  #Hypothyroidism: Synthroid 250 mcg @ home TSH  <0.01 -hold Synthroid, consider restarting at decreased dose (possibly 200 mcg) on 2/8  #Hyperlipidemia: Lipitor 20 mg @ home, continue  FEN/GI: Protonix (PO), Clear liquid diet Prophylaxis: sub-q heparin  Disposition: Management as above, with discharge likely home pending improvement in PO intake and pain  Subjective:  Patient reports throat pain is still present but "some better" with liquid oxycodone and feels magic mouthwash is also helpful. Denies chest pain, SOB, otherwise. Appetite is "so so."  Objective: Temp:  [97.4 F (36.3 C)-99.6 F (37.6 C)] 99.5 F (37.5 C) (02/07 1022) Pulse Rate:  [91-96] 92 (02/07 1022) Resp:  [16-18] 18 (02/07 1022) BP: (121-155)/(74-91) 134/82 mmHg (02/07 1022) SpO2:  [98 %-100 %] 100 % (02/07 1022) Weight:  [111 lb 8 oz (50.576 kg)] 111 lb 8 oz (50.576 kg) (02/06 2223)   Physical Exam: General: awake, alert, lying in bed, NAD Cardiovascular: RRR, no m/r/g Respiratory: CTAB, no increased WOB, no wheeze Abdomen: flat, soft, NT/ND, +BS Extremities: very thin but well-perfused and warm, no edema Neuro: follows commands, no focal neurological deficits, is not vocal but is able to nod to questions and mouth words.  Laboratory:  Recent Labs Lab 04/09/14 0620 04/10/14 0656 04/11/14 0755  WBC 5.4 3.6* 4.6  HGB 9.7* 10.7* 10.0*  HCT 29.6* 32.3* 30.1*  PLT 209 177 168    Recent Labs Lab 04/08/14 1825 04/09/14 0620 04/10/14 0656 04/11/14 0755  NA 144 143 139 135  K 3.4* 3.1* 3.1* 3.2*  CL 107 113* 110 102  CO2 21 20 25 25   BUN 21 13 6  <5*  CREATININE 1.07 1.00 0.79 0.80  CALCIUM 9.7 8.9 8.8 8.6  PROT 8.2  --   --   --   BILITOT 1.5*  --   --   --   ALKPHOS 84  --   --   --   ALT 10  --   --   --   AST 18  --   --   --   GLUCOSE 80 75 100* 104*   Lipase 2/3: 18  Urinalysis 2/4 Component Value   COLORURINE RED*   APPEARANCEUR TURBID*   LABSPEC 1.021   PHURINE 5.5   GLUCOSEU NEGATIVE   HGBUR LARGE*   BILIRUBINUR  MODERATE*   KETONESUR 40*   PROTEINUR 100*   UROBILINOGEN 1.0   NITRITE NEGATIVE   LEUKOCYTESUR TRACE*    Imaging/Diagnostic Tests: CXR 2/3 @1651 : no active cardiopulmonary process  CT Head 2/3 @1759 : no acute intracranial process; mild to moderate cortical volume loss; scattered small vessel disease; mild partial opacification of sphenoid sinus  CT soft tissue neck 2/4 @1032 : new irregular mucosal enhancement along soft palate to palantine tonsils bilaterally, more on left; hx laryngectomy with radical neck dissection, post-surgical changes; no significant neck lymphadenopathy but with 3 sublingual nodes mor prominent than 2012; degenerative cervical spine changes  CTA chest and abd/pelvis @1033 : new sclerosis at T1 vertebral body with inferior endplate concavity; scattered "considerable" atherosclerosis; emphysema; chronic mild nodularity bilateral lungs stable from 2012; pancreatic calcifications in head and uncinate process, present in 2015 as well; bilateral nonobstructive renal calculi; chronic bilateral avascular necrosis of both femoral heads  EGD 2/5 (Dr. Amedeo Plenty): "Apparent hypopharyngeal mass, no extension into the esophagus where there is some chronic proximal fibrosis with slight narrowing." Biopsy taken.  EGD biopsy: PENDING  Emmaline Kluver, MD 04/12/2014, 11:46 AM PGY-3, [place] Intern pager: [phone], text pages welcome

## 2014-04-12 NOTE — Progress Notes (Signed)
Patient and daughter requesting diet increased to mechanical soft. Per family patient still hungry with clear liquid diet.  MD notified.

## 2014-04-13 ENCOUNTER — Encounter (HOSPITAL_COMMUNITY): Payer: Self-pay | Admitting: Gastroenterology

## 2014-04-13 DIAGNOSIS — F1029 Alcohol dependence with unspecified alcohol-induced disorder: Secondary | ICD-10-CM

## 2014-04-13 DIAGNOSIS — K228 Other specified diseases of esophagus: Secondary | ICD-10-CM | POA: Insufficient documentation

## 2014-04-13 DIAGNOSIS — R634 Abnormal weight loss: Secondary | ICD-10-CM

## 2014-04-13 DIAGNOSIS — K229 Disease of esophagus, unspecified: Secondary | ICD-10-CM

## 2014-04-13 DIAGNOSIS — K2289 Other specified disease of esophagus: Secondary | ICD-10-CM | POA: Insufficient documentation

## 2014-04-13 LAB — COMPREHENSIVE METABOLIC PANEL
ALBUMIN: 2.7 g/dL — AB (ref 3.5–5.2)
ALT: 17 U/L (ref 0–53)
AST: 19 U/L (ref 0–37)
Alkaline Phosphatase: 106 U/L (ref 39–117)
Anion gap: 9 (ref 5–15)
BILIRUBIN TOTAL: 1 mg/dL (ref 0.3–1.2)
BUN: 7 mg/dL (ref 6–23)
CO2: 26 mmol/L (ref 19–32)
Calcium: 9.2 mg/dL (ref 8.4–10.5)
Chloride: 100 mmol/L (ref 96–112)
Creatinine, Ser: 0.93 mg/dL (ref 0.50–1.35)
GFR, EST NON AFRICAN AMERICAN: 89 mL/min — AB (ref >90)
Glucose, Bld: 92 mg/dL (ref 70–99)
Potassium: 4.2 mmol/L (ref 3.5–5.1)
Sodium: 135 mmol/L (ref 135–145)
Total Protein: 6.9 g/dL (ref 6.0–8.3)

## 2014-04-13 LAB — CBC
HEMATOCRIT: 31.2 % — AB (ref 39.0–52.0)
HEMOGLOBIN: 10.5 g/dL — AB (ref 13.0–17.0)
MCH: 26.4 pg (ref 26.0–34.0)
MCHC: 33.7 g/dL (ref 30.0–36.0)
MCV: 78.6 fL (ref 78.0–100.0)
Platelets: 144 10*3/uL — ABNORMAL LOW (ref 150–400)
RBC: 3.97 MIL/uL — ABNORMAL LOW (ref 4.22–5.81)
RDW: 16.1 % — AB (ref 11.5–15.5)
WBC: 4.8 10*3/uL (ref 4.0–10.5)

## 2014-04-13 MED ORDER — MAGIC MOUTHWASH W/LIDOCAINE
5.0000 mL | Freq: Three times a day (TID) | ORAL | Status: DC
  Administered 2014-04-13 (×2): 5 mL via ORAL
  Filled 2014-04-13 (×5): qty 5

## 2014-04-13 NOTE — Progress Notes (Signed)
**  Interval Note**  Visited patient this evening and spoke to children regarding status and plan.  Discussed that we are currently still waiting on pathology results and that interventions would be per ENT assessment at [facility].  Informed them that Palliative care was on board and giving recommendations.  Per daughter, she would like outpatient Palliative care.   Patient continues to endorse throat pain not relieved by Oxy or MMW (without lido).  Again, patient expressing desire to eat.  Spoke with Dr Roxanne Gates who will contact [facility] for palliative services.  Also, recommending adding lidocaine to be added to MMW and swallowed until able to be seen by [facility] for further intervention.  He will see patient this afternoon and hopefully, speak to daughter regarding current [place].  Leonila Speranza M. Lajuana Ripple, DO PGY-1, [place]

## 2014-04-13 NOTE — Progress Notes (Signed)
Chaplain responded to page from RN.  MD at bedside meeting w/pt and family.  Pt's daughter and son at bedside.  Chaplain reviewed advanced directive materials, appear complete.  Pt's daughter needs to leave for work but is coming in morning.  Chaplain will inform Spiritual Care office of pending AD.  Pt complains of serious pain, expresses strong desire to eat but cannot due to pain.  Chaplain provided emotional support as well as ministry of presence.  Chaplain will follow up as needed.    04/13/14 1300  Clinical Encounter Type  Visited With Patient and family together;Health care provider  Visit Type Initial;Social support  Referral From Nurse  Spiritual Encounters  Spiritual Needs Literature;Emotional  Stress Factors  Patient Stress Factors Exhausted;Health changes  Family Stress Factors Health changes  Advance Directives (For Healthcare)  Does patient have an advance directive? No  Would patient like information on creating an advanced directive? Yes - Educational materials given   Geralyn Flash 04/13/2014 1:47 PM

## 2014-04-13 NOTE — Progress Notes (Addendum)
Family Medicine Teaching Service Daily Progress Note Intern Pager: [phone]  Patient name: [name] Medical record number: [id number] Date of birth: 04-Oct-1950 Age: 64 y.o. 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 Simeon Craft) recommending waiting for pathology result and [facility] ENT / onc follow-up 2/7: pain control slowly helping, PO intake still relatively poor  Assessment and Plan: [name] is a 64 y.o. male presenting with an acute LOC after poor PO intake over the last 5 days. PMH is significant for laryngeal cancer removed 2009, sCHF EF 45-50%, HTN, cirrhosis of the liver, chronic pancreatitis and ETOH dependence.   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 - biopsy pending -continue nystatin suspension 4 times daily, continue magic mouthwash 4 times daily PRN - appreciate GI and ENT recs; awaiting path, may need oncology f/u and will definitely need [facility] ENT f/u (pt will need to call, [phone]) - appreciate palliative care consult; continue oxycodone liquid for topical effect, magic mouthwash  #Hx laryngeal cancer with resection 2009; Endoscopy 2/4 with evidence of new lesion (see above) - Reportedly "cleared" by ENT however no records from [facility] since 05/2013 - Concern for weight loss over last year; possibly related to the above - will need outpt f/u, as above  #Poor oral intake 2/2 throat pain - slightly improved #Loss of Consciousness: Now resolved. Prior to admit with confusion after regaining consciousness but no seizure-like activity otherwise, no recurrence so far  while admitted.  - Neuro exam unrevealing but orthostatic on admission.CXR no acute processes.CT head no acute processes.CBC unremarkable. EKG non-ischemic, troponins negative. No hx of seizure. Broad DDx, but most likely believed to be related poor PO intake due to pain. - Carotid dopplers with R 1-39% and L 40-59% stenosis, anterograde flow; repeat echo as below (stable from 2014) - continue telemetry, seizure precautions; consider consult to neuro if any seizure-like activity - Exudate appreciated on exam on admission.Consideration for strep (rapid negative, culture negative) vs oropharyngeal candidiasis or esophagitis. HIV negative.   #Weight loss: 23 lb weight loss since 07/2013, very thin/malnourished on exam. Weight variable here (104 on 2/4 one scale, 110 lb on second scale; 111 on 2/6) - A1c 5.8 - Concerning for relation to malignancy in setting of h/o laryngeal cancer with ?new hypopharyngeal mass (recurrence vs mets vs new primary cancer), as related to the above - FOBT neg. Reports never having colonoscopy. - will need outpt f/u, as above  #HTN: uncontrolled BP on admission 198/106. On hydralazine 25 mg BID, bisoprolol 10 mg daily, and lisinopril 10 mg daily at home. BP soft this morning 98/67 - Continue bisoprolol, lisinopril & hydralazine 25 mg BID - Consider decreasing Lisinopril - hydralazine 10 mg q4 PRN  #Hypokalemia: mild, with  Mg 1.6 on 2/5 K 4.3 -monitor and replete PRN  #Systolic CHF: echo shows EF 50-55%, focal basal hypertrophy of septum, and grade 1 LV diastolic dysfunction (previously moderate LVH, EF 45-50%, w/ grade 1 diastolic dysfunction in 3875)  #Cirrhosis of liver: LFTs WNL, liver not palpably enlarged  #ETOH dependence: Last reported drink 2 weeks ago. Usually drinks "1 pint" over  2 weeks. No evidence of withdrawal on admission -continue CIWA protocol, multivitamin, thiamine  #Chronic pancreatitis: on Creon at home. Lipase 18 on 2/3 -Continue home  medication  #Hypothyroidism: Synthroid 250 mcg @ home TSH <0.01 -hold Synthroid, consider restarting at decreased dose (possibly 200 mcg) on 2/8  #Hyperlipidemia: Lipitor 20 mg @ home, continue  FEN/GI: Protonix (PO), Soft mechanical diet Prophylaxis: sub-q heparin  Disposition: Management as above, with discharge likely home pending improvement in PO intake and pain  Subjective:  Patient reports he is hungry but otherwise doing ok.  Per patient feels that Pain medication not helpful but does express some improvement with MMW.  RN however, thinks the opposite is true.  Denies N/V/pain elsewhere.  Objective: Temp:  [98.7 F (37.1 C)-100.1 F (37.8 C)] 99.3 F (37.4 C) (02/08 0830) Pulse Rate:  [89-105] 100 (02/08 0830) Resp:  [16-18] 16 (02/08 0830) BP: (98-142)/(67-87) 118/79 mmHg (02/08 0830) SpO2:  [95 %-100 %] 98 % (02/08 0830) Weight:  [110 lb (49.896 kg)] 110 lb (49.896 kg) (02/07 2100)   Physical Exam: General: awake, alert, lying in bed, NAD Cardiovascular: RRR, no m/r/g Respiratory: CTAB, no increased WOB, no wheeze Abdomen: flat, soft, NT/ND, +BS Extremities: very thin but well-perfused and warm, no edema Neuro: follows commands, no focal neurological deficits, is not vocal but is able to nod to questions and mouth words.  Laboratory:  Recent Labs Lab 04/10/14 0656 04/11/14 0755 04/13/14 0742  WBC 3.6* 4.6 4.8  HGB 10.7* 10.0* 10.5*  HCT 32.3* 30.1* 31.2*  PLT 177 168 144*    Recent Labs Lab 04/08/14 1825  04/10/14 0656 04/11/14 0755 04/12/14 1946  NA 144  < > 139 135 134*  K 3.4*  < > 3.1* 3.2* 4.3  CL 107  < > 110 102 101  CO2 21  < > 25 25 30   BUN 21  < > 6 <5* <5*  CREATININE 1.07  < > 0.79 0.80 0.84  CALCIUM 9.7  < > 8.8 8.6 8.8  PROT 8.2  --   --   --   --   BILITOT 1.5*  --   --   --   --   ALKPHOS 84  --   --   --   --   ALT 10  --   --   --   --   AST 18  --   --   --   --   GLUCOSE 80  < > 100* 104* 102*  < > = values in this  interval not displayed. Lipase 2/3: 18  Urinalysis 2/4 Component Value   COLORURINE RED*   APPEARANCEUR TURBID*   LABSPEC 1.021   PHURINE 5.5   GLUCOSEU NEGATIVE   HGBUR LARGE*   BILIRUBINUR MODERATE*   KETONESUR 40*   PROTEINUR 100*   UROBILINOGEN 1.0   NITRITE NEGATIVE   LEUKOCYTESUR TRACE*    Imaging/Diagnostic Tests: CXR 2/3 @1651 : no active cardiopulmonary process  CT Head 2/3 @1759 : no acute intracranial process; mild to moderate cortical volume loss; scattered small vessel disease; mild partial opacification of sphenoid sinus  CT soft tissue neck 2/4 @1032 : new irregular mucosal enhancement along soft palate to palantine tonsils bilaterally, more on left; hx laryngectomy with radical neck dissection, post-surgical changes; no significant neck lymphadenopathy but with 3 sublingual nodes mor prominent than 2012; degenerative cervical spine changes  CTA chest and abd/pelvis @1033 : new sclerosis at T1 vertebral body with inferior endplate concavity; scattered "considerable" atherosclerosis; emphysema;  chronic mild nodularity bilateral lungs stable from 2012; pancreatic calcifications in head and uncinate process, present in 2015 as well; bilateral nonobstructive renal calculi; chronic bilateral avascular necrosis of both femoral heads  EGD 2/5 (Dr. Amedeo Plenty): "Apparent hypopharyngeal mass, no extension into the esophagus where there is some chronic proximal fibrosis with slight narrowing." Biopsy taken.  EGD biopsy: PENDING  Janora Norlander, DO 04/13/2014, 9:57 AM PGY-1, [place] Intern pager: [phone], text pages welcome

## 2014-04-13 NOTE — Evaluation (Signed)
Physical Therapy Evaluation Patient Details Name: [name] MRN: [id number] DOB: 1950-04-29 Today's Date: 04/13/2014   History of Present Illness  Patient is a 64 y/o male presenting with an acute LOC after poor PO intake over the last 5 days. PMH is significant for laryngeal cancer removed 2009, sCHF EF 45-50%, HTN, cirrhosis of the liver, chronic pancreatitis and ETOH dependence. 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 CXR no acute processes. CT head no acute processes. CBC unremarkable. EKG non-ischemic, troponins negative. Palliaitive consult.     Clinical Impression  Patient presents with balance deficits, deconditioning and generalized weakness impacting safe mobility. Would benefit from use of RW for stability/support. Will need to trial next session prior to discharge. Pt would benefit from skilled PT to improve gait, balance and overall safe mobility so pt can maximize independence and return to PLOF.    Follow Up Recommendations Supervision/Assistance - 24 hour;No PT follow up    Equipment Recommendations  Rolling walker with 5" wheels    Recommendations for Other Services OT consult     Precautions / Restrictions Precautions Precautions: Fall Restrictions Weight Bearing Restrictions: No      Mobility  Bed Mobility Overal bed mobility: Modified Independent Bed Mobility: Supine to Sit;Sit to Supine     Supine to sit: HOB elevated;Modified independent (Device/Increase time) Sit to supine: Modified independent (Device/Increase time);HOB elevated      Transfers Overall transfer level: Needs assistance Equipment used: None Transfers: Sit to/from Stand Sit to Stand: Supervision         General transfer comment: Supervision for safety. + dizziness. No vitals taken. Returned to sitting. Stood after resolution (2-3 mins)of symptoms x1.  Ambulation/Gait Ambulation/Gait assistance: Min guard Ambulation Distance (Feet): 75 Feet Assistive  device: None Gait Pattern/deviations: Step-through pattern;Decreased stride length;Narrow base of support   Gait velocity interpretation: Below normal speed for age/gender General Gait Details: Pt with slow, unsteady gait. No arm swing bilaterally. Would benefit from use of RW for support.  Stairs            Wheelchair Mobility    Modified Rankin (Stroke Patients Only)       Balance Overall balance assessment: Needs assistance Sitting-balance support: Feet supported;No upper extremity supported Sitting balance-[name]: Good     Standing balance support: During functional activity Standing balance-[name]: Fair                               Pertinent Vitals/Pain Pain Assessment: Faces Faces Pain Scale: Hurts even more Pain Location: throat Pain Descriptors / Indicators: Sore;Aching Pain Intervention(s): Monitored during session;Repositioned    Home Living Family/patient expects to be discharged to:: Private residence Living Arrangements: Children Available Help at Discharge: Family;Available PRN/intermittently Type of Home: House Home Access: Stairs to enter Entrance Stairs-Rails: Right Entrance Stairs-Number of Steps: 2   Home Equipment: None      Prior Function Level of Independence: Independent               Hand Dominance   Dominant Hand: Right    Extremity/Trunk Assessment   Upper Extremity Assessment: Defer to OT evaluation           Lower Extremity Assessment: Generalized weakness         Communication   Communication:  (Non verbal- mouths words or nods yes or no appropriately.)  Cognition Arousal/Alertness: Awake/alert Behavior During Therapy: WFL for tasks assessed/performed Overall Cognitive Status:  Within Functional Limits for tasks assessed                      General Comments      Exercises        Assessment/Plan    PT Assessment Patient needs continued PT services  PT Diagnosis  Generalized weakness;Difficulty walking   PT Problem List Decreased strength;Decreased activity tolerance;Decreased knowledge of use of DME;Decreased balance;Decreased mobility;Decreased safety awareness  PT Treatment Interventions Balance training;Gait training;Patient/family education;Functional mobility training;Therapeutic activities;Therapeutic exercise;Stair training;DME instruction   PT Goals (Current goals can be found in the Care Plan section) Acute Rehab PT Goals Patient Stated Goal: none stated PT Goal Formulation: With patient Time For Goal Achievement: 04/27/14 Potential to Achieve Goals: Fair    Frequency Min 3X/week   Barriers to discharge Decreased caregiver support Pt reports he has someone at home with him all the time, unsure of accuracy?    Co-evaluation               End of Session Equipment Utilized During Treatment: Gait belt Activity Tolerance: Patient tolerated treatment well Patient left: in bed;with call bell/phone within reach;with bed alarm set Nurse Communication: Mobility status         Time: 0165-5374 PT Time Calculation (min) (ACUTE ONLY): 13 min   Charges:   PT Evaluation $Initial PT Evaluation Tier I: 1 Procedure     PT G CodesCandy Sledge A May 13, 2014, 4:51 PM Candy Sledge, [place], DPT [phone]

## 2014-04-13 NOTE — Progress Notes (Signed)
Patient [name]      DOB: January 12, 1951      WCB:[id number]   Palliative Medicine Team at [facility] Progress Note    Subjective: Still with severe odynophagia.  Little relief with PO oxycodone and makes him sleepy.  No nausea/vomiting associated with it. No other acute complaints.    Filed Vitals:   04/13/14 0830  BP: 118/79  Pulse: 100  Temp: 99.3 F (37.4 C)  Resp: 16   Physical exam: Gen: alert, NAD HEENT: [place], sclera anicteric Skin: warm/dry   CBC    Component Value Date/Time   WBC 4.8 04/13/2014 0742   WBC 4.3 11/04/2009 1559   RBC 3.97* 04/13/2014 0742   RBC 4.17* 11/04/2009 1559   HGB 10.5* 04/13/2014 0742   HGB 12.4* 11/04/2009 1559   HCT 31.2* 04/13/2014 0742   HCT 38.0* 11/04/2009 1559   PLT 144* 04/13/2014 0742   PLT 169 11/04/2009 1559   MCV 78.6 04/13/2014 0742   MCV 91.2 11/04/2009 1559   MCH 26.4 04/13/2014 0742   MCHC 33.7 04/13/2014 0742   MCHC 32.6 11/04/2009 1559   RDW 16.1* 04/13/2014 0742   RDW 17.2* 11/04/2009 1559   LYMPHSABS 1.4 04/08/2014 1825   LYMPHSABS 2.1 11/04/2009 1559   MONOABS 0.6 04/08/2014 1825   MONOABS 0.4 11/04/2009 1559   EOSABS 0.1 04/08/2014 1825   EOSABS 0.1 11/04/2009 1559   BASOSABS 0.0 04/08/2014 1825   BASOSABS 0.0 11/04/2009 1559    CMP     Component Value Date/Time   NA 135 04/13/2014 0742   K 4.2 04/13/2014 0742   CL 100 04/13/2014 0742   CO2 26 04/13/2014 0742   GLUCOSE 92 04/13/2014 0742   BUN 7 04/13/2014 0742   CREATININE 0.93 04/13/2014 0742   CREATININE 0.70 06/10/2013 1043   CALCIUM 9.2 04/13/2014 0742   PROT 6.9 04/13/2014 0742   ALBUMIN 2.7* 04/13/2014 0742   AST 19 04/13/2014 0742   ALT 17 04/13/2014 0742   ALKPHOS 106 04/13/2014 0742   BILITOT 1.0 04/13/2014 0742   GFRNONAA 89* 04/13/2014 0742   GFRAA >90 04/13/2014 0742     Assessment and plan: 65 yo male with PMHx of laryngeal CA s/p resection in 2011 (per [facility] ENT note), HTN, CHF, etoh abuse. Presented with LOC and  weight loss w/odynophagia  1. Code Status: Full, did not discuss  2. GOC: See initial consult.  Daughter was by today but she had to go to work.  [name] tells me she will be back around 10AM tomorrow and I will plan on meeting up there. They are interested in outpatient palliative care services and those resources are limited here.  They do have an outpatient palliative care clinic through [facility] which may make the most sense since he will FU w/ENT there.  Will see if we can get them appt there.   3. Symptom Management:  1. Odynophagia: oxycodone doesn't seem to help much and makes him sleepy. Not sure going up on dose would be very effective then. Spoke with resident team and I think it would be reasonable to try mouthwash with lidocaine and having him swallow. Viscous lidocaine seemed to help with his upper throat pain, so may help with some of lower pharynx pain he is having.    4. Psychosocial/Spiritual: 4 children and currently lives at home with his daughter in [place]. Originally from [place].   Total Time: 25 minutes >50% of time spent in counseling and coordination of care  regarding above.    Doran Clay D.O. Palliative Medicine Team at [facility]  Pager: [phone] Team Phone: [phone]

## 2014-04-13 NOTE — Care Management Note (Signed)
CARE MANAGEMENT NOTE 04/13/2014  Patient:  [name], [name]   Account Number:  [id number]  Date Initiated:  04/10/2014  Documentation initiated by:  Carmello Cabiness  Subjective/Objective Assessment:   CM following for progression and d/c planning.     Action/Plan:   04/13/2014 Met with pt , plan is to return to home.   Anticipated DC Date:  04/14/2014   Anticipated DC Plan:  [place]         Choice offered to / List presented to:             Status of service:  In process, will continue to follow Medicare Important Message given?  YES (If response is "NO", the following Medicare IM given date fields will be blank) Date Medicare IM given:  04/10/2014 Medicare IM given by:  Trevaris Pennella Date Additional Medicare IM given:  04/13/2014 Additional Medicare IM given by:  [facility]  Discharge Disposition:    Per UR Regulation:    If discussed at Long Length of Stay Meetings, dates discussed:    Comments:

## 2014-04-14 DIAGNOSIS — C140 Malignant neoplasm of pharynx, unspecified: Secondary | ICD-10-CM

## 2014-04-14 MED ORDER — MAGIC MOUTHWASH W/LIDOCAINE: 5.0000 mL | Freq: Three times a day (TID) | ORAL | Status: DC

## 2014-04-14 MED ORDER — OXYCODONE HCL 5 MG/5ML PO SOLN: 5.0000 mg | ORAL | Status: DC | PRN

## 2014-04-14 MED ORDER — OXYCODONE HCL 5 MG/5ML PO SOLN
5.0000 mg | ORAL | Status: DC | PRN
  Administered 2014-04-14: 5 mg via ORAL
  Filled 2014-04-14: qty 5

## 2014-04-14 MED ORDER — LEVOTHYROXINE SODIUM 200 MCG PO TABS: 200.0000 ug | ORAL_TABLET | Freq: Every day | ORAL | Status: DC

## 2014-04-14 MED ORDER — MAGIC MOUTHWASH W/LIDOCAINE
5.0000 mL | Freq: Three times a day (TID) | ORAL | Status: DC
  Administered 2014-04-14: 5 mL via ORAL
  Filled 2014-04-14 (×4): qty 5

## 2014-04-14 NOTE — Progress Notes (Signed)
Discharge instructions reviewed with pt; allowing time for questions. Pt verbalized understanding. IV removed without issue. Prescriptions given to pt. Pt to leave unit via wheelchair accompanied by Student Nurse.

## 2014-04-14 NOTE — Progress Notes (Signed)
Responded to spiritual care consult to assist patient and family in completing AD. Copies were made and given to family, patient and to nurse for patient's chart.    04/14/14 1500  Clinical Encounter Type  Visited With Patient and family together;Health care provider  Visit Type Initial;Spiritual support;Other (Comment) (assist with completing AD)  Referral From Nurse  Spiritual Encounters  Spiritual Needs Literature;Emotional  Stress Factors  Patient Stress Factors None identified  Family Stress Factors Lack of knowledge  Advance Directives (For Healthcare)  Does patient have an advance directive? Yes  Penni Penado, Chaplain

## 2014-04-14 NOTE — Progress Notes (Signed)
RT assessed PT- PT states that his trach was removed 2 years ago. PT does not appear to be in respiratory distress at this time. Encouraged PT to notify staff for any respiratory needs during this admission.

## 2014-04-14 NOTE — Discharge Instructions (Signed)
You were admitted for a loss of consciousness.  We found that it was not due to your heart or brain.  You had an EGD done that evaluated your throat pain and were found to have squamous cell carcinoma (like before).  You are scheduled to see [facility] TOMORROW 2/10 at 2:15pm.  Please make sure to go to this appointment.  You are being prescribed pain medication to use until you are seen.  Please use the oxycodone with EXTREME caution.  Do not drive with this medication.   Malnutrition Many of [place] think of malnutrition as a condition in which there is not enough to eat. Malnutrition is actually any condition where nutrition is poor. This means:  Too much to eat as we see in conditions of obesity.  Too little to eat with starvation. The following information is only for the malnourished with dietary deficiencies (poor diet). CAUSES  Under-nutrition can result from:  Poor intake.  Malabsorption  Lactation  Bleeding  Diarrhea  Old age.  Kidney failure.  Infancy  Poverty  Infection  Adolescence  Excessive sweating  Drug addiction  Pregnancy  Early childhood. Under-nutrition comes anytime the demand is more than the intake. SYMPTOMS  The problems depend on what type of malnutrition is present. Some general symptoms include:  Fatigue.  Dizziness.  Fainting  Weight loss.  Poor immune response.  Lack of menstruation.  Lack of growth in children.  Hair loss. DIAGNOSIS  Your caregiver will usually suspect malnutrition based on results of your:   Medical and dietary history.  Physical exam. This will often include measurements of your BMI (body mass index).  Perhaps some blood tests. These may include: plasma levels of nutrients and nutrient-dependent substances, such as:  Hemoglobin  Thyroid hormones  Transferrin  Albumin RISK FACTORS Persons in the following circumstances may be at risk of malnutrition.  Infants and children are at risk of  under-nutrition. This is because of their high demand for energy and essential nutrients. Protein-energy malnutrition in children consuming inadequate amounts of protein, calories, and other nutrients is a particularly severe form of under-nutrition that delays growth and development. This includes [place] and [place].  Hemorrhagic disease of the newborn is a life-threatening disorder. This is due to a lack of vitamin K, iron, folic acid, vitamin C, copper, zinc, and vitamin A. This may occur in inadequately fed infants and children.  In adolescence, nutritional requirements increase because they are growing. Anorexia nervosa, a form of starvation, may affect adolescents.  Pregnancy and lactation. Requirements for all nutrients are increased during pregnancy and lactation.  Abnormal diets, such as pica (the consumption of nonnutritive substances, such as clay and charcoal), are common in pregnancy.  Anemia due to folic acid deficiency is common in pregnant women. This is especially true for those who have taken oral contraceptives. Folic acid supplements are now recommended for pregnant women. Folic acid prevents neural tube defects (spina bifida) in children.  Breast-fed-only infants may develop vitamin B12 deficiency if the mother is a vegan.  An alcoholic mother may have a handicapped and stunted child with fetal alcohol syndrome. This is due to the effects of alcohol on the fetus. Do not drink during pregnancy.  Old age: A weakened sense of taste and smell, loneliness, physical and mental handicaps, immobility, and chronic illness can hurt the food intake in the elderly. Absorption is reduced. This may add to iron deficiency, calcium and bone problems and also a softening of the bones due to  lack of vitamin D. This is also made worse by not being in the sun.  With aging, we loose lean body mass. These changes and a reduction in physical activity result in lower energy and protein  requirements compared with those of younger adults.  Chronic disease including malabsorption states (including those resulting from surgery) tend to impair the absorption of fat-soluble vitamins, vitamin B12, calcium, and iron.  Liver disease impairs the storage of vitamins A and B12. It also interferes with the metabolism of protein and energy sources.  Kidney disease may cause deficiencies of protein, iron, and vitamin D.  Cancer and AIDS may cause anorexia. This is a loss of appetite.  Vegetarian diet. The most common form of this type of diet is when meat and fish are not eaten, but eggs and dairy products are eaten. Iron deficiency is the only risk. Ovo-lacto vegetarians tend to live longer and to develop fewer chronic disabling conditions than their meat-eating peers. However, their lifestyle usually includes regular exercise and abstention from alcohol and tobacco. This may contribute to better health. Vegans consume no animal products and are susceptible to vitamin B12 deficiency. Yeast extracts and oriental-style fermented foods provide this vitamin. Intake of calcium, iron, and zinc also tends to be low. A fruitarian diet (eat only fruit) is deficient in protein, salt, and many micronutrients. This is not recommended.  Fad diets: Many commercial diets are claimed to enhance well-being or reduce weight. A physician should be alert to early evidence of nutrient deficiency or toxicity in patients on these diets. Such diets have resulted in vitamin, mineral, and protein deficiency states and cardiac, renal, and metabolic disorders. Some fad diets have resulted in death. People on very low calorie diets (less than 400 kcal/day) cannot sustain health for long. Some trace mineral supplements have induced toxicity.  Alcohol or drug dependency: Addiction leads to a troubled lifestyle in which adequate nourishment is ignored. Absorption and metabolism of nutrients are impaired. High levels of alcohol  are poisonous. Too much alcohol can cause tissue injury, particularly of the GI tract, liver, pancreas, brain, and peripheral nervous system. Beer drinkers who consume food may gain weight, but alcoholics who use more than one quart of hard liquor per day lose weight and become undernourished. Drug addicts are usually very skinny. Alcoholism is the most common cause of thiamine deficiency and may lead to deficiencies of magnesium, zinc, and other vitamins. TREATMENT  Get treatment if you experience changes in how your body is working.  PREVENTION  Eating a good, well-balanced diet helps to prevent most forms of malnutrition. Document Released: 01/06/2005 Document Revised: 05/15/2011 Document Reviewed: 01/28/2007 [facility] Patient Information 2015 [place], [place]. This information is not intended to replace advice given to you by your health care provider. Make sure you discuss any questions you have with your health care provider.

## 2014-04-14 NOTE — Evaluation (Signed)
Occupational Therapy Evaluation Patient Details Name: CLELL TRAHAN MRN: [id number] DOB: 1951-02-18 Today's Date: 04/14/2014    History of Present Illness Patient is a 64 y/o male presenting with an acute LOC after poor PO intake over the last 5 days. PMH is significant for laryngeal cancer removed 2009, sCHF EF 45-50%, HTN, cirrhosis of the liver, chronic pancreatitis and ETOH dependence. 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 CXR no acute processes. CT head no acute processes. CBC unremarkable. EKG non-ischemic, troponins negative. Palliaitive consult.    Clinical Impression   Pt participated in ADL retraining session today for grooming, toileting and LB dressing. He states that he has no further acute OT needs, as he is at baseline level, but is interested in 3:1 for use at home to provide assist and also for potential use in shower as needed. Pt reports that he will have assist from family PRN at d/c & plans to d/c later today. Will sign off acute OT at this time.    Follow Up Recommendations  No OT follow up    Equipment Recommendations  3 in 1 bedside comode    Recommendations for Other Services       Precautions / Restrictions Precautions Precautions: Fall Restrictions Weight Bearing Restrictions: No      Mobility Bed Mobility Overal bed mobility: Modified Independent Bed Mobility: Supine to Sit;Sit to Supine     Supine to sit: Modified independent (Device/Increase time) Sit to supine: Modified independent (Device/Increase time);HOB elevated   General bed mobility comments: Pt able to transition to EOB with use of bed rails and increased time. Pt able to manipulate bed linens indepdnently.   Transfers Overall transfer level: Needs assistance Equipment used: 1 person hand held assist (1 person HHA vs RW) Transfers: Sit to/from [facility] Sit to Stand: Supervision Stand pivot transfers: Supervision       General transfer  comment: Supervision for safety as pt was slightly impulsive. No dizziness reported.     Balance Overall balance assessment: Needs assistance Sitting-balance support: Feet supported;No upper extremity supported Sitting balance-[name]: Good     Standing balance support: No upper extremity supported;During functional activity Standing balance-[name]: Fair                              ADL Overall ADL's : At baseline;Needs assistance/impaired     Grooming: Wash/dry hands;Wash/dry face;Supervision/safety;Standing Grooming Details (indicate cue type and reason): Standing at sink in room Upper Body Bathing: Sitting;Modified independent;Set up   Lower Body Bathing: Set up;Sit to/from stand;Modified independent   Upper Body Dressing : Set up;Sitting   Lower Body Dressing: Modified independent;Sit to/from stand;Set up   Toilet Transfer: Supervision/safety;Regular Toilet;Grab bars;RW   Toileting- Clothing Manipulation and Hygiene: Modified independent;Sit to/from stand       Functional mobility during ADLs: Supervision/safety;Rolling walker (Supervision w/ RW vs HHA ) General ADL Comments: Pt participated in ADL retraining session today for grooming, toileting and LB dressing. He states that he has no further acute OT needs, but is interested in 3:1 for use at home to provide assist and also for potential use in shower as needed. Pt reports that he has assist from family PRN at d/c.     Vision  No changes from baseline                   Perception     Praxis  Pertinent Vitals/Pain Pain Assessment: Faces Faces Pain Scale: Hurts a little bit Pain Location: Throat Pain Descriptors / Indicators: Sore Pain Intervention(s): Monitored during session;Other (comment) (Pt reports he was premedicated prior to session)     Hand Dominance Right   Extremity/Trunk Assessment Upper Extremity Assessment Upper Extremity Assessment: Overall WFL for tasks  assessed   Lower Extremity Assessment Lower Extremity Assessment: Defer to PT evaluation       Communication Communication Communication:  (Non-verbal, mouths words or nods head yes/no appropriately)   Cognition Arousal/Alertness: Awake/alert Behavior During Therapy: WFL for tasks assessed/performed Overall Cognitive Status: Within Functional Limits for tasks assessed                     General Comments       Exercises       Shoulder Instructions      Home Living Family/patient expects to be discharged to:: Private residence Living Arrangements: Children Available Help at Discharge: Family;Available PRN/intermittently Type of Home: House Home Access: Stairs to enter [employer] of Steps: 2 Entrance Stairs-Rails: Right   Alternate Level Stairs-Number of Steps: flight   Bathroom Shower/Tub: Occupational psychologist: Standard     Home Equipment: None          Prior Functioning/Environment Level of Independence: Independent             OT Diagnosis:     OT Problem List:     OT Treatment/Interventions:      OT Goals(Current goals can be found in the care plan section) Acute Rehab OT Goals Patient Stated Goal: Home today OT Goal Formulation: All assessment and education complete, DC therapy (Pt states he is at baseline level at this time and reports that family will assist PRN at d/c)  OT Frequency:     Barriers to D/C:            Co-evaluation              End of Session Equipment Utilized During Treatment: Gait belt  Activity Tolerance: Patient tolerated treatment well Patient left: in bed;with call bell/phone within reach   Time: 7014-1030 OT Time Calculation (min): 16 min Charges:  OT General Charges $OT Visit: 1 Procedure OT Evaluation $Initial OT Evaluation Tier I: 1 Procedure G-Codes:    Josephine Igo Dixon, OTR/L 04/14/2014, 10:09 AM

## 2014-04-14 NOTE — Progress Notes (Signed)
Met breifly with daughter and Yue this afternoon as being discharged.  Sounds like MMW w/lidocaine has been most helpful to him. I provided them both with contact information to [facility] palliative care and they plan on following up with them as well.   Doran Clay D.O. Palliative Medicine Team at [facility]  Pager: [phone] Team Phone: [phone]

## 2014-04-14 NOTE — Progress Notes (Signed)
Physical Therapy Treatment Patient Details Name: [name] MRN: [id number] DOB: 02/06/1951 Today's Date: 04/14/2014    History of Present Illness Patient is a 64 y/o male presenting with an acute LOC after poor PO intake over the last 5 days. PMH is significant for laryngeal cancer removed 2009, sCHF EF 45-50%, HTN, cirrhosis of the liver, chronic pancreatitis and ETOH dependence. 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 CXR no acute processes. CT head no acute processes. CBC unremarkable. EKG non-ischemic, troponins negative. Palliaitive consult.     PT Comments    Pt progressing towards physical therapy goals. Focus of session was DME training prior to d/c per PT eval. Pt was able to improve ambulation distance with RW. Occasional mild instability noted however pt was able to self-correct with UE support on walker, and he reports mild fatigue at end of gait training. Pt will require RW at d/c, and reports that he has all other necessary equipment at home. Will continue to follow and progress as able per POC.   Follow Up Recommendations  Supervision/Assistance - 24 hour;No PT follow up     Equipment Recommendations  Rolling walker with 5" wheels    Recommendations for Other Services OT consult     Precautions / Restrictions Precautions Precautions: Fall Restrictions Weight Bearing Restrictions: No    Mobility  Bed Mobility Overal bed mobility: Modified Independent Bed Mobility: Supine to Sit     Supine to sit: Modified independent (Device/Increase time)     General bed mobility comments: Pt able to transition to EOB with use of bed rails and increased time. Pt able to manipulate bed linens indepdnently.   Transfers Overall transfer level: Needs assistance Equipment used: Rolling walker (2 wheeled) Transfers: Sit to/from Stand Sit to Stand: Supervision         General transfer comment: Supervision for safety as pt was slightly impulsive. No  dizziness reported.   Ambulation/Gait Ambulation/Gait assistance: Supervision Ambulation Distance (Feet): 300 Feet Assistive device: Rolling walker (2 wheeled) Gait Pattern/deviations: Step-through pattern;Decreased stride length;Trunk flexed Gait velocity: Decreased Gait velocity interpretation: Below normal speed for age/gender General Gait Details: Pt was able to ambulate well with the RW. VC's for general safety awareness.    Stairs            Wheelchair Mobility    Modified Rankin (Stroke Patients Only)       Balance Overall balance assessment: Needs assistance Sitting-balance support: Feet supported;No upper extremity supported Sitting balance-[name]: Good     Standing balance support: No upper extremity supported;During functional activity Standing balance-[name]: Fair                      Cognition Arousal/Alertness: Awake/alert Behavior During Therapy: WFL for tasks assessed/performed Overall Cognitive Status: Within Functional Limits for tasks assessed                      Exercises      General Comments        Pertinent Vitals/Pain Pain Assessment: Faces Faces Pain Scale: No hurt    Home Living                      Prior Function            PT Goals (current goals can now be found in the care plan section) Acute Rehab PT Goals Patient Stated Goal: Home today PT Goal Formulation: With patient Time  For Goal Achievement: 04/27/14 Potential to Achieve Goals: Fair Progress towards PT goals: Progressing toward goals    Frequency  Min 3X/week    PT Plan Current plan remains appropriate    Co-evaluation             End of Session Equipment Utilized During Treatment: Gait belt Activity Tolerance: Patient tolerated treatment well Patient left: in chair;with call bell/phone within reach     Time: 0757-0815 PT Time Calculation (min) (ACUTE ONLY): 18 min  Charges:  $Gait Training: 8-22 mins                     G Codes:      Rolinda Roan May 10, 2014, 8:27 AM   Rolinda Roan, PT, DPT Acute Rehabilitation Services Pager: [phone]

## 2014-04-15 DIAGNOSIS — D0008 Carcinoma in situ of pharynx: Secondary | ICD-10-CM | POA: Diagnosis present

## 2014-04-15 DIAGNOSIS — Z923 Personal history of irradiation: Secondary | ICD-10-CM | POA: Diagnosis not present

## 2014-04-15 DIAGNOSIS — E785 Hyperlipidemia, unspecified: Secondary | ICD-10-CM | POA: Diagnosis present

## 2014-04-15 DIAGNOSIS — Z8521 Personal history of malignant neoplasm of larynx: Secondary | ICD-10-CM | POA: Diagnosis not present

## 2014-04-15 DIAGNOSIS — Z4682 Encounter for fitting and adjustment of non-vascular catheter: Secondary | ICD-10-CM | POA: Diagnosis not present

## 2014-04-15 DIAGNOSIS — Z87891 Personal history of nicotine dependence: Secondary | ICD-10-CM | POA: Diagnosis not present

## 2014-04-15 DIAGNOSIS — Z681 Body mass index (BMI) 19 or less, adult: Secondary | ICD-10-CM | POA: Diagnosis not present

## 2014-04-15 DIAGNOSIS — E41 Nutritional marasmus: Secondary | ICD-10-CM | POA: Diagnosis present

## 2014-04-15 DIAGNOSIS — K860 Alcohol-induced chronic pancreatitis: Secondary | ICD-10-CM | POA: Diagnosis present

## 2014-04-15 DIAGNOSIS — R1310 Dysphagia, unspecified: Secondary | ICD-10-CM | POA: Diagnosis not present

## 2014-04-15 DIAGNOSIS — C321 Malignant neoplasm of supraglottis: Secondary | ICD-10-CM | POA: Diagnosis not present

## 2014-04-15 DIAGNOSIS — E039 Hypothyroidism, unspecified: Secondary | ICD-10-CM | POA: Diagnosis present

## 2014-04-15 DIAGNOSIS — K7011 Alcoholic hepatitis with ascites: Secondary | ICD-10-CM | POA: Diagnosis not present

## 2014-04-15 DIAGNOSIS — E271 Primary adrenocortical insufficiency: Secondary | ICD-10-CM | POA: Diagnosis present

## 2014-04-15 DIAGNOSIS — E860 Dehydration: Secondary | ICD-10-CM | POA: Diagnosis present

## 2014-04-15 DIAGNOSIS — J45909 Unspecified asthma, uncomplicated: Secondary | ICD-10-CM | POA: Diagnosis present

## 2014-04-15 DIAGNOSIS — I10 Essential (primary) hypertension: Secondary | ICD-10-CM | POA: Diagnosis present

## 2014-04-15 DIAGNOSIS — Z9002 Acquired absence of larynx: Secondary | ICD-10-CM | POA: Diagnosis present

## 2014-04-15 DIAGNOSIS — R627 Adult failure to thrive: Secondary | ICD-10-CM | POA: Diagnosis not present

## 2014-04-15 DIAGNOSIS — C102 Malignant neoplasm of lateral wall of oropharynx: Secondary | ICD-10-CM | POA: Diagnosis not present

## 2014-04-15 DIAGNOSIS — M1990 Unspecified osteoarthritis, unspecified site: Secondary | ICD-10-CM | POA: Diagnosis present

## 2014-04-15 DIAGNOSIS — K7030 Alcoholic cirrhosis of liver without ascites: Secondary | ICD-10-CM | POA: Diagnosis present

## 2014-04-15 DIAGNOSIS — Z431 Encounter for attention to gastrostomy: Secondary | ICD-10-CM | POA: Diagnosis not present

## 2014-04-15 DIAGNOSIS — J449 Chronic obstructive pulmonary disease, unspecified: Secondary | ICD-10-CM | POA: Diagnosis present

## 2014-04-15 DIAGNOSIS — E46 Unspecified protein-calorie malnutrition: Secondary | ICD-10-CM | POA: Diagnosis not present

## 2014-04-15 DIAGNOSIS — R1313 Dysphagia, pharyngeal phase: Secondary | ICD-10-CM | POA: Diagnosis not present

## 2014-04-15 DIAGNOSIS — J029 Acute pharyngitis, unspecified: Secondary | ICD-10-CM | POA: Diagnosis not present

## 2014-04-15 DIAGNOSIS — R070 Pain in throat: Secondary | ICD-10-CM | POA: Diagnosis not present

## 2014-04-15 DIAGNOSIS — E273 Drug-induced adrenocortical insufficiency: Secondary | ICD-10-CM | POA: Diagnosis present

## 2014-04-15 DIAGNOSIS — E43 Unspecified severe protein-calorie malnutrition: Secondary | ICD-10-CM | POA: Diagnosis present

## 2014-04-15 DIAGNOSIS — Z7982 Long term (current) use of aspirin: Secondary | ICD-10-CM | POA: Diagnosis not present

## 2014-04-16 DIAGNOSIS — R627 Adult failure to thrive: Secondary | ICD-10-CM | POA: Diagnosis present

## 2014-04-21 ENCOUNTER — Telehealth: Payer: Self-pay | Admitting: Oncology

## 2014-04-21 DIAGNOSIS — Z431 Encounter for attention to gastrostomy: Secondary | ICD-10-CM | POA: Diagnosis not present

## 2014-04-21 DIAGNOSIS — K7030 Alcoholic cirrhosis of liver without ascites: Secondary | ICD-10-CM | POA: Diagnosis not present

## 2014-04-21 DIAGNOSIS — C140 Malignant neoplasm of pharynx, unspecified: Secondary | ICD-10-CM | POA: Diagnosis not present

## 2014-04-21 DIAGNOSIS — I5021 Acute systolic (congestive) heart failure: Secondary | ICD-10-CM | POA: Diagnosis not present

## 2014-04-21 DIAGNOSIS — Z430 Encounter for attention to tracheostomy: Secondary | ICD-10-CM | POA: Diagnosis not present

## 2014-04-21 DIAGNOSIS — J9620 Acute and chronic respiratory failure, unspecified whether with hypoxia or hypercapnia: Secondary | ICD-10-CM | POA: Diagnosis not present

## 2014-04-21 DIAGNOSIS — R1310 Dysphagia, unspecified: Secondary | ICD-10-CM | POA: Diagnosis not present

## 2014-04-21 DIAGNOSIS — R636 Underweight: Secondary | ICD-10-CM | POA: Diagnosis not present

## 2014-04-21 DIAGNOSIS — E43 Unspecified severe protein-calorie malnutrition: Secondary | ICD-10-CM | POA: Diagnosis not present

## 2014-04-21 DIAGNOSIS — C321 Malignant neoplasm of supraglottis: Secondary | ICD-10-CM | POA: Diagnosis not present

## 2014-04-21 NOTE — Telephone Encounter (Signed)
Faxed pt medical records to [facility]

## 2014-04-22 ENCOUNTER — Encounter: Payer: Self-pay | Admitting: Family Medicine

## 2014-04-22 NOTE — Progress Notes (Unsigned)
Faxed over reports to [facility] from [facility] at [phone]. Katharina Caper, Diamonte Stavely D

## 2014-04-23 DIAGNOSIS — E43 Unspecified severe protein-calorie malnutrition: Secondary | ICD-10-CM | POA: Diagnosis not present

## 2014-04-23 DIAGNOSIS — I5021 Acute systolic (congestive) heart failure: Secondary | ICD-10-CM | POA: Diagnosis not present

## 2014-04-23 DIAGNOSIS — R1310 Dysphagia, unspecified: Secondary | ICD-10-CM | POA: Diagnosis not present

## 2014-04-23 DIAGNOSIS — C321 Malignant neoplasm of supraglottis: Secondary | ICD-10-CM | POA: Diagnosis not present

## 2014-04-23 DIAGNOSIS — R636 Underweight: Secondary | ICD-10-CM | POA: Diagnosis not present

## 2014-04-23 DIAGNOSIS — C140 Malignant neoplasm of pharynx, unspecified: Secondary | ICD-10-CM | POA: Diagnosis not present

## 2014-04-24 ENCOUNTER — Inpatient Hospital Stay: Payer: Medicare Other | Admitting: Family Medicine

## 2014-04-27 DIAGNOSIS — C051 Malignant neoplasm of soft palate: Secondary | ICD-10-CM | POA: Diagnosis not present

## 2014-04-27 DIAGNOSIS — R52 Pain, unspecified: Secondary | ICD-10-CM | POA: Diagnosis not present

## 2014-04-27 DIAGNOSIS — C140 Malignant neoplasm of pharynx, unspecified: Secondary | ICD-10-CM | POA: Diagnosis not present

## 2014-04-27 DIAGNOSIS — Z8521 Personal history of malignant neoplasm of larynx: Secondary | ICD-10-CM | POA: Diagnosis not present

## 2014-04-28 DIAGNOSIS — C321 Malignant neoplasm of supraglottis: Secondary | ICD-10-CM | POA: Diagnosis not present

## 2014-04-28 DIAGNOSIS — R636 Underweight: Secondary | ICD-10-CM | POA: Diagnosis not present

## 2014-04-28 DIAGNOSIS — E43 Unspecified severe protein-calorie malnutrition: Secondary | ICD-10-CM | POA: Diagnosis not present

## 2014-04-28 DIAGNOSIS — C140 Malignant neoplasm of pharynx, unspecified: Secondary | ICD-10-CM | POA: Diagnosis not present

## 2014-04-28 DIAGNOSIS — I5021 Acute systolic (congestive) heart failure: Secondary | ICD-10-CM | POA: Diagnosis not present

## 2014-04-28 DIAGNOSIS — R1310 Dysphagia, unspecified: Secondary | ICD-10-CM | POA: Diagnosis not present

## 2014-04-29 ENCOUNTER — Telehealth: Payer: Self-pay | Admitting: *Deleted

## 2014-04-29 ENCOUNTER — Ambulatory Visit: Payer: Medicare Other

## 2014-04-29 ENCOUNTER — Ambulatory Visit
Admission: RE | Admit: 2014-04-29 | Discharge: 2014-04-29 | Disposition: A | Discharge status: Home / Self Care | Payer: Medicare Other | Source: Ambulatory Visit | Attending: Radiation Oncology | Admitting: Radiation Oncology

## 2014-04-29 NOTE — Telephone Encounter (Signed)
Yes, this pt needs medical social worker and speech therapy. Please call and give verbal for me. Thanks.

## 2014-04-29 NOTE — Telephone Encounter (Signed)
Left message for Nira Conn, RN [facility]: verbal order given by Dr. Raoul Pitch for speech therapy and medical social worker.  Derl Barrow, RN

## 2014-04-29 NOTE — Telephone Encounter (Signed)
Nira Conn, RN with [facility] called needing verbal orders medical social worker and speech therapy.  Please give her a call at [phone].  Derl Barrow, RN

## 2014-05-01 DIAGNOSIS — R636 Underweight: Secondary | ICD-10-CM | POA: Diagnosis not present

## 2014-05-01 DIAGNOSIS — R1310 Dysphagia, unspecified: Secondary | ICD-10-CM | POA: Diagnosis not present

## 2014-05-01 DIAGNOSIS — C140 Malignant neoplasm of pharynx, unspecified: Secondary | ICD-10-CM | POA: Diagnosis not present

## 2014-05-01 DIAGNOSIS — C321 Malignant neoplasm of supraglottis: Secondary | ICD-10-CM | POA: Diagnosis not present

## 2014-05-01 DIAGNOSIS — I5021 Acute systolic (congestive) heart failure: Secondary | ICD-10-CM | POA: Diagnosis not present

## 2014-05-01 DIAGNOSIS — E43 Unspecified severe protein-calorie malnutrition: Secondary | ICD-10-CM | POA: Diagnosis not present

## 2014-05-04 ENCOUNTER — Encounter (HOSPITAL_COMMUNITY): Payer: Self-pay | Admitting: Emergency Medicine

## 2014-05-04 ENCOUNTER — Inpatient Hospital Stay (HOSPITAL_COMMUNITY)
Admission: EM | Admit: 2014-05-04 | Discharge: 2014-05-07 | DRG: 682 | Disposition: A | Discharge status: Home Health | Payer: Medicare Other | Attending: Family Medicine | Admitting: Family Medicine

## 2014-05-04 DIAGNOSIS — E039 Hypothyroidism, unspecified: Secondary | ICD-10-CM | POA: Diagnosis present

## 2014-05-04 DIAGNOSIS — R6510 Systemic inflammatory response syndrome (SIRS) of non-infectious origin without acute organ dysfunction: Secondary | ICD-10-CM | POA: Insufficient documentation

## 2014-05-04 DIAGNOSIS — C329 Malignant neoplasm of larynx, unspecified: Secondary | ICD-10-CM | POA: Diagnosis present

## 2014-05-04 DIAGNOSIS — C801 Malignant (primary) neoplasm, unspecified: Secondary | ICD-10-CM

## 2014-05-04 DIAGNOSIS — Z79899 Other long term (current) drug therapy: Secondary | ICD-10-CM

## 2014-05-04 DIAGNOSIS — R630 Anorexia: Secondary | ICD-10-CM | POA: Diagnosis present

## 2014-05-04 DIAGNOSIS — I951 Orthostatic hypotension: Secondary | ICD-10-CM | POA: Diagnosis present

## 2014-05-04 DIAGNOSIS — N179 Acute kidney failure, unspecified: Principal | ICD-10-CM | POA: Insufficient documentation

## 2014-05-04 DIAGNOSIS — I10 Essential (primary) hypertension: Secondary | ICD-10-CM | POA: Diagnosis present

## 2014-05-04 DIAGNOSIS — E43 Unspecified severe protein-calorie malnutrition: Secondary | ICD-10-CM | POA: Diagnosis present

## 2014-05-04 DIAGNOSIS — I5021 Acute systolic (congestive) heart failure: Secondary | ICD-10-CM | POA: Diagnosis not present

## 2014-05-04 DIAGNOSIS — Z931 Gastrostomy status: Secondary | ICD-10-CM

## 2014-05-04 DIAGNOSIS — Z681 Body mass index (BMI) 19 or less, adult: Secondary | ICD-10-CM

## 2014-05-04 DIAGNOSIS — Z79818 Long term (current) use of other agents affecting estrogen receptors and estrogen levels: Secondary | ICD-10-CM

## 2014-05-04 DIAGNOSIS — Z7982 Long term (current) use of aspirin: Secondary | ICD-10-CM

## 2014-05-04 DIAGNOSIS — C321 Malignant neoplasm of supraglottis: Secondary | ICD-10-CM | POA: Diagnosis not present

## 2014-05-04 DIAGNOSIS — F1020 Alcohol dependence, uncomplicated: Secondary | ICD-10-CM | POA: Diagnosis present

## 2014-05-04 DIAGNOSIS — Z8521 Personal history of malignant neoplasm of larynx: Secondary | ICD-10-CM

## 2014-05-04 DIAGNOSIS — E860 Dehydration: Secondary | ICD-10-CM | POA: Diagnosis not present

## 2014-05-04 DIAGNOSIS — E785 Hyperlipidemia, unspecified: Secondary | ICD-10-CM | POA: Diagnosis present

## 2014-05-04 DIAGNOSIS — K5900 Constipation, unspecified: Secondary | ICD-10-CM | POA: Diagnosis present

## 2014-05-04 DIAGNOSIS — Z515 Encounter for palliative care: Secondary | ICD-10-CM

## 2014-05-04 DIAGNOSIS — I959 Hypotension, unspecified: Secondary | ICD-10-CM | POA: Diagnosis not present

## 2014-05-04 DIAGNOSIS — R1310 Dysphagia, unspecified: Secondary | ICD-10-CM | POA: Diagnosis not present

## 2014-05-04 DIAGNOSIS — R509 Fever, unspecified: Secondary | ICD-10-CM | POA: Insufficient documentation

## 2014-05-04 DIAGNOSIS — R531 Weakness: Secondary | ICD-10-CM | POA: Diagnosis not present

## 2014-05-04 DIAGNOSIS — I5022 Chronic systolic (congestive) heart failure: Secondary | ICD-10-CM | POA: Diagnosis present

## 2014-05-04 DIAGNOSIS — R569 Unspecified convulsions: Secondary | ICD-10-CM

## 2014-05-04 DIAGNOSIS — R636 Underweight: Secondary | ICD-10-CM | POA: Diagnosis not present

## 2014-05-04 DIAGNOSIS — C140 Malignant neoplasm of pharynx, unspecified: Secondary | ICD-10-CM | POA: Diagnosis not present

## 2014-05-04 DIAGNOSIS — R634 Abnormal weight loss: Secondary | ICD-10-CM | POA: Diagnosis present

## 2014-05-04 DIAGNOSIS — R404 Transient alteration of awareness: Secondary | ICD-10-CM | POA: Diagnosis not present

## 2014-05-04 DIAGNOSIS — Z87891 Personal history of nicotine dependence: Secondary | ICD-10-CM

## 2014-05-04 DIAGNOSIS — J029 Acute pharyngitis, unspecified: Secondary | ICD-10-CM | POA: Diagnosis present

## 2014-05-04 DIAGNOSIS — K7460 Unspecified cirrhosis of liver: Secondary | ICD-10-CM | POA: Diagnosis present

## 2014-05-04 DIAGNOSIS — K861 Other chronic pancreatitis: Secondary | ICD-10-CM | POA: Diagnosis present

## 2014-05-04 DIAGNOSIS — C139 Malignant neoplasm of hypopharynx, unspecified: Secondary | ICD-10-CM | POA: Diagnosis present

## 2014-05-04 LAB — CBC WITH DIFFERENTIAL/PLATELET
Basophils Absolute: 0 10*3/uL (ref 0.0–0.1)
Basophils Relative: 0 % (ref 0–1)
Eosinophils Absolute: 0.1 10*3/uL (ref 0.0–0.7)
Eosinophils Relative: 1 % (ref 0–5)
HCT: 32.8 % — ABNORMAL LOW (ref 39.0–52.0)
Hemoglobin: 10.8 g/dL — ABNORMAL LOW (ref 13.0–17.0)
Lymphocytes Relative: 20 % (ref 12–46)
Lymphs Abs: 2.3 10*3/uL (ref 0.7–4.0)
MCH: 26.8 pg (ref 26.0–34.0)
MCHC: 32.9 g/dL (ref 30.0–36.0)
MCV: 81.4 fL (ref 78.0–100.0)
Monocytes Absolute: 1.9 10*3/uL — ABNORMAL HIGH (ref 0.1–1.0)
Monocytes Relative: 16 % — ABNORMAL HIGH (ref 3–12)
Neutro Abs: 7.4 10*3/uL (ref 1.7–7.7)
Neutrophils Relative %: 63 % (ref 43–77)
Platelets: 267 10*3/uL (ref 150–400)
RBC: 4.03 MIL/uL — ABNORMAL LOW (ref 4.22–5.81)
RDW: 17.4 % — ABNORMAL HIGH (ref 11.5–15.5)
WBC: 11.7 10*3/uL — ABNORMAL HIGH (ref 4.0–10.5)

## 2014-05-04 LAB — URINALYSIS, ROUTINE W REFLEX MICROSCOPIC
Bilirubin Urine: NEGATIVE
Glucose, UA: NEGATIVE mg/dL
HGB URINE DIPSTICK: NEGATIVE
Ketones, ur: NEGATIVE mg/dL
Leukocytes, UA: NEGATIVE
Nitrite: NEGATIVE
PH: 8 (ref 5.0–8.0)
PROTEIN: NEGATIVE mg/dL
Specific Gravity, Urine: 1.014 (ref 1.005–1.030)
Urobilinogen, UA: 1 mg/dL (ref 0.0–1.0)

## 2014-05-04 LAB — COMPREHENSIVE METABOLIC PANEL
ALT: 37 U/L (ref 0–53)
ANION GAP: 10 (ref 5–15)
AST: 31 U/L (ref 0–37)
Albumin: 3.5 g/dL (ref 3.5–5.2)
Alkaline Phosphatase: 101 U/L (ref 39–117)
BILIRUBIN TOTAL: 0.9 mg/dL (ref 0.3–1.2)
BUN: 51 mg/dL — AB (ref 6–23)
CALCIUM: 9.7 mg/dL (ref 8.4–10.5)
CHLORIDE: 98 mmol/L (ref 96–112)
CO2: 27 mmol/L (ref 19–32)
CREATININE: 1.49 mg/dL — AB (ref 0.50–1.35)
GFR, EST AFRICAN AMERICAN: 57 mL/min — AB (ref >90)
GFR, EST NON AFRICAN AMERICAN: 49 mL/min — AB (ref >90)
Glucose, Bld: 105 mg/dL — ABNORMAL HIGH (ref 70–99)
Potassium: 4.9 mmol/L (ref 3.5–5.1)
Sodium: 135 mmol/L (ref 135–145)
Total Protein: 8 g/dL (ref 6.0–8.3)

## 2014-05-04 LAB — I-STAT CG4 LACTIC ACID, ED: Lactic Acid, Venous: 3.69 mmol/L (ref 0.5–2.0)

## 2014-05-04 LAB — MRSA PCR SCREENING: MRSA by PCR: NEGATIVE

## 2014-05-04 LAB — TROPONIN I: Troponin I: <0.03 ng/mL

## 2014-05-04 MED ORDER — SODIUM CHLORIDE 0.9 % IV BOLUS (SEPSIS)
1000.0000 mL | Freq: Once | INTRAVENOUS | Status: AC
  Administered 2014-05-04: 1000 mL via INTRAVENOUS

## 2014-05-04 MED ORDER — SODIUM CHLORIDE 0.9 % IV SOLN
INTRAVENOUS | Status: AC
  Administered 2014-05-04: 20:00:00 via INTRAVENOUS

## 2014-05-04 NOTE — ED Provider Notes (Addendum)
CSN: [id number]     Arrival date & time 05/04/14  1356 History   First MD Initiated Contact with Patient 05/04/14 1456     Chief Complaint  Patient presents with  . Hypotension     (Consider location/radiation/quality/duration/timing/severity/associated sxs/prior Treatment) HPI Comments: Patient presents to the ER for evaluation of weakness. Patient was reportedly feeling constipated took milk of magnesia. Today he has been having frequent watery diarrhea. Caregiver found him to be hypotensive. He has been experiencing generalized weakness. He denies chest pain, shortness of breath. There has not been any fever, cough or congestion. He has not had any associated abdominal pain, nausea or vomiting.   Past Medical History  Diagnosis Date  . Hypertension   . Cirrhosis of liver   . Hypothyroidism   . Alcohol abuse   . CHF (congestive heart failure)     EF 45-50% 05/2012  . Hyperlipidemia   . Arthritis   . Laryngeal cancer 2011    Laryngectomy, T3N0M0   Past Surgical History  Procedure Laterality Date  . Tracheal surgery    . Tracheostomy    . Left and right heart catheterization with coronary angiogram N/A 12/28/2011    Procedure: LEFT AND RIGHT HEART CATHETERIZATION WITH CORONARY ANGIOGRAM;  Surgeon: Jolaine Artist, MD;  Location: [facility] CATH LAB;  Service: Cardiovascular;  Laterality: N/A;  . Esophagogastroduodenoscopy N/A 04/10/2014    Procedure: ESOPHAGOGASTRODUODENOSCOPY (EGD);  Surgeon: Missy Sabins, MD;  Location: [facility] ENDOSCOPY;  Service: Endoscopy;  Laterality: N/A;   History reviewed. No pertinent family history. History  Substance Use Topics  . Smoking status: Former Smoker -- 0.20 packs/day for 50 years    Types: Cigarettes  . Smokeless tobacco: Never Used  . Alcohol Use: Yes     Comment: "rarely"    Review of Systems  Gastrointestinal: Positive for diarrhea.  Neurological: Positive for weakness.  All other systems reviewed and are negative.     Allergies   Vicodin; Other; and Tylenol  Home Medications   Prior to Admission medications   Medication Sig Start Date End Date Taking? Authorizing Provider  Alum & Mag Hydroxide-Simeth (MAGIC MOUTHWASH W/LIDOCAINE) SOLN Take 5 mLs by mouth 3 (three) times daily. Patient taking differently: Take 5 mLs by mouth 4 (four) times daily.  04/14/14  Yes Ashly M Gottschalk, DO  amLODipine (NORVASC) 5 MG tablet Take 1 tablet (5 mg total) by mouth daily. NEEDS OFFICE VISIT 01/01/14  Yes Jolaine Artist, MD  aspirin EC 81 MG tablet Take 81 mg by mouth daily. 03/12/14  Yes Historical Provider, MD  bisoprolol (ZEBETA) 10 MG tablet TAKE 1 TABLET BY MOUTH EVERY DAY 03/23/14  Yes Renee A Kuneff, DO  diclofenac sodium (VOLTAREN) 1 % GEL Apply 2 g topically 4 (four) times daily as needed (pain).    Yes Historical Provider, MD  fentaNYL (DURAGESIC - DOSED MCG/HR) 25 MCG/HR patch Place 25 mcg onto the skin every 3 (three) days.   Yes Historical Provider, MD  guaiFENesin (MUCINEX) 600 MG 12 hr tablet Take 600 mg by mouth daily as needed for cough or to loosen phlegm.    Yes Historical Provider, MD  hydrALAZINE (APRESOLINE) 25 MG tablet TAKE 2 BY MOUTH TWICE DAILY AT LUNCH AND AFTER DINNER. Follow up with PCP. Patient taking differently: Take 25 mg by mouth 2 (two) times daily. TWICE DAILY AT LUNCH AND AFTER DINNER. Follow up with PCP. 04/02/14  Yes Hilton Sinclair, MD  levothyroxine (SYNTHROID, LEVOTHROID) 200 MCG tablet Take 1  tablet (200 mcg total) by mouth daily before breakfast. 04/14/14  Yes Ashly M Gottschalk, DO  lipase/protease/amylase (CREON-12/PANCREASE) 12000 UNITS CPEP capsule Take 1 capsule by mouth 3 (three) times daily before meals. 05/14/13  Yes Leone Brand, MD  lisinopril (PRINIVIL,ZESTRIL) 10 MG tablet Take 1 tablet (10 mg total) by mouth daily. 06/10/13  Yes Alveda Reasons, MD  megestrol (MEGACE) 40 MG/ML suspension Take 400 mg by mouth 2 (two) times daily. 04/20/14  Yes Historical Provider, MD  ondansetron  (ZOFRAN) 4 MG tablet Take 1 tablet (4 mg total) by mouth every 8 (eight) hours as needed for nausea or vomiting. 05/14/13  Yes Leone Brand, MD  oxyCODONE (ROXICODONE) 5 MG/5ML solution Take 5 mLs (5 mg total) by mouth every 4 (four) hours as needed for severe pain. 04/14/14  Yes Ashly M Gottschalk, DO  atorvastatin (LIPITOR) 20 MG tablet Take 1 tablet (20 mg total) by mouth daily. Patient not taking: Reported on 04/08/2014 01/13/14   Renee A Kuneff, DO  pantoprazole (PROTONIX) 40 MG tablet Take 1 tablet (40 mg total) by mouth at bedtime. Patient not taking: Reported on 05/04/2014 05/14/13   Leone Brand, MD   BP 102/65 mmHg  Pulse 94  Temp(Src) 98.4 F (36.9 C) (Oral)  Resp 16  SpO2 99% Physical Exam  Constitutional: He is oriented to person, place, and time. He appears well-developed and well-nourished. No distress.  HENT:  Head: Normocephalic and atraumatic.  Right Ear: Hearing normal.  Left Ear: Hearing normal.  Nose: Nose normal.  Mouth/Throat: Oropharynx is clear and moist and mucous membranes are normal.  Eyes: Conjunctivae and EOM are normal. Pupils are equal, round, and reactive to light.  Neck: Normal range of motion. Neck supple.  Cardiovascular: Regular rhythm, S1 normal and S2 normal.  Exam reveals no gallop and no friction rub.   No murmur heard. Pulmonary/Chest: Effort normal and breath sounds normal. No respiratory distress. He exhibits no tenderness.  Abdominal: Soft. Normal appearance and bowel sounds are normal. There is no hepatosplenomegaly. There is no tenderness. There is no rebound, no guarding, no tenderness at McBurney's point and negative Murphy's sign. No hernia.  Musculoskeletal: Normal range of motion.  Neurological: He is alert and oriented to person, place, and time. He has normal strength. No cranial nerve deficit or sensory deficit. Coordination normal. GCS eye subscore is 4. GCS verbal subscore is 5. GCS motor subscore is 6.  Skin: Skin is warm, dry and  intact. No rash noted. No cyanosis.  Psychiatric: He has a normal mood and affect. His speech is normal and behavior is normal. Thought content normal.  Nursing note and vitals reviewed.   ED Course  Procedures (including critical care time) Labs Review Labs Reviewed  CBC WITH DIFFERENTIAL/PLATELET - Abnormal; Notable for the following:    WBC 11.7 (*)    RBC 4.03 (*)    Hemoglobin 10.8 (*)    HCT 32.8 (*)    RDW 17.4 (*)    Monocytes Relative 16 (*)    Monocytes Absolute 1.9 (*)    All other components within normal limits  COMPREHENSIVE METABOLIC PANEL - Abnormal; Notable for the following:    Glucose, Bld 105 (*)    BUN 51 (*)    Creatinine, Ser 1.49 (*)    GFR calc non Af Amer 49 (*)    GFR calc Af Amer 57 (*)    All other components within normal limits  I-STAT CG4 LACTIC ACID, ED - Abnormal;  Notable for the following:    Lactic Acid, Venous 3.69 (*)    All other components within normal limits  TROPONIN I  URINALYSIS, ROUTINE W REFLEX MICROSCOPIC    Imaging Review No results found.   EKG Interpretation   Date/Time:  Monday May 04 2014 14:46:11 EST Ventricular Rate:  93 PR Interval:  168 QRS Duration: 68 QT Interval:  339 QTC Calculation: 422 R Axis:   -6 Text Interpretation:  Sinus rhythm Abnormal R-wave progression, early  transition No significant change since last tracing Confirmed by Maximus Hoffert   MD, Alahni Varone [phone]) on 05/04/2014 3:08:38 PM      MDM   Final diagnoses:  Hypotension, unspecified hypotension type  Dehydration  AKI (acute kidney injury)    Patient presents to the ER for evaluation of hypotension. Patient has been experiencing diarrhea starting this morning after taking a laxative for constipation. He is not experiencing abdominal pain and has a benign abdominal exam. Patient was hypotensive at arrival, 67/48. After IV fluids, 1 L bolus, patient still hypotensive. Blood pressure 90 systolic. He is not symptomatically lying flat,  but cannot sit up. Patient does have a mildly elevated lactate. BUN creatinine are significantly elevated over baseline. This is consistent with severe dehydration resulting in hypotension. No sign of infection at this time. No significant change in his hemoglobin or signs of active bleeding.  Patient will require hospitalization for continued IV fluid therapy and monitoring.   Addendum: Discussed with family practice residency. The felt that the patient's blood pressure was too low for transport, wanted some improvement before accepting the patient. Patient was given a second liter of fluid. Patient has had blood pressure as high as 109/63, staying around 037 systolic currently. Discussed patient's current status and vital signs, it is felt that the patient is appropriate for transfer at this time.    Orpah Greek, MD 05/04/14 0964  Orpah Greek, MD 05/04/14 1950

## 2014-05-04 NOTE — ED Notes (Signed)
Pt unable to void, refuses in and out cath

## 2014-05-04 NOTE — ED Notes (Addendum)
Per caregiver, pt with Hx of cancer was constipated and so began taking milk of magnesia, began having bad diarrhea, found to be hypotensive. Pt receives nutrition and hydration through feeding tube, has been losing weight, also has tracheostomy and Hx of low O2 saturation. BP 67/48

## 2014-05-04 NOTE — ED Notes (Signed)
Pt unable to void at this time. 

## 2014-05-04 NOTE — ED Notes (Signed)
Daughter [name] had to leave, contact information in chart.

## 2014-05-04 NOTE — ED Notes (Signed)
Pt unable to stand due to drop in blood pressure

## 2014-05-04 NOTE — Progress Notes (Signed)
Pt with stoma that he states he has had for around 5 years. Site looks clean and dry, no secretions. HR 78 Sats 100% on room air. No distress at this time.

## 2014-05-04 NOTE — ED Notes (Signed)
Elevated lactic acid given to Dr Betsey Holiday.

## 2014-05-05 ENCOUNTER — Encounter (HOSPITAL_COMMUNITY): Payer: Self-pay | Admitting: Certified Registered Nurse Anesthetist

## 2014-05-05 DIAGNOSIS — C329 Malignant neoplasm of larynx, unspecified: Secondary | ICD-10-CM | POA: Diagnosis not present

## 2014-05-05 DIAGNOSIS — Z931 Gastrostomy status: Secondary | ICD-10-CM | POA: Diagnosis not present

## 2014-05-05 DIAGNOSIS — Z87891 Personal history of nicotine dependence: Secondary | ICD-10-CM | POA: Diagnosis not present

## 2014-05-05 DIAGNOSIS — E785 Hyperlipidemia, unspecified: Secondary | ICD-10-CM | POA: Diagnosis present

## 2014-05-05 DIAGNOSIS — K861 Other chronic pancreatitis: Secondary | ICD-10-CM | POA: Diagnosis present

## 2014-05-05 DIAGNOSIS — D320 Benign neoplasm of cerebral meninges: Secondary | ICD-10-CM | POA: Diagnosis not present

## 2014-05-05 DIAGNOSIS — K5900 Constipation, unspecified: Secondary | ICD-10-CM | POA: Diagnosis present

## 2014-05-05 DIAGNOSIS — R1310 Dysphagia, unspecified: Secondary | ICD-10-CM | POA: Diagnosis not present

## 2014-05-05 DIAGNOSIS — A419 Sepsis, unspecified organism: Secondary | ICD-10-CM | POA: Diagnosis not present

## 2014-05-05 DIAGNOSIS — R6510 Systemic inflammatory response syndrome (SIRS) of non-infectious origin without acute organ dysfunction: Secondary | ICD-10-CM | POA: Diagnosis present

## 2014-05-05 DIAGNOSIS — Z681 Body mass index (BMI) 19 or less, adult: Secondary | ICD-10-CM | POA: Diagnosis not present

## 2014-05-05 DIAGNOSIS — E860 Dehydration: Secondary | ICD-10-CM | POA: Diagnosis not present

## 2014-05-05 DIAGNOSIS — E43 Unspecified severe protein-calorie malnutrition: Secondary | ICD-10-CM | POA: Diagnosis not present

## 2014-05-05 DIAGNOSIS — Z79818 Long term (current) use of other agents affecting estrogen receptors and estrogen levels: Secondary | ICD-10-CM | POA: Diagnosis not present

## 2014-05-05 DIAGNOSIS — F1020 Alcohol dependence, uncomplicated: Secondary | ICD-10-CM | POA: Diagnosis present

## 2014-05-05 DIAGNOSIS — I959 Hypotension, unspecified: Secondary | ICD-10-CM

## 2014-05-05 DIAGNOSIS — Z79899 Other long term (current) drug therapy: Secondary | ICD-10-CM | POA: Diagnosis not present

## 2014-05-05 DIAGNOSIS — K7460 Unspecified cirrhosis of liver: Secondary | ICD-10-CM | POA: Diagnosis present

## 2014-05-05 DIAGNOSIS — E039 Hypothyroidism, unspecified: Secondary | ICD-10-CM | POA: Diagnosis present

## 2014-05-05 DIAGNOSIS — I10 Essential (primary) hypertension: Secondary | ICD-10-CM | POA: Diagnosis not present

## 2014-05-05 DIAGNOSIS — Z515 Encounter for palliative care: Secondary | ICD-10-CM | POA: Diagnosis not present

## 2014-05-05 DIAGNOSIS — N179 Acute kidney failure, unspecified: Principal | ICD-10-CM

## 2014-05-05 DIAGNOSIS — Z7982 Long term (current) use of aspirin: Secondary | ICD-10-CM | POA: Diagnosis not present

## 2014-05-05 DIAGNOSIS — R569 Unspecified convulsions: Secondary | ICD-10-CM | POA: Diagnosis not present

## 2014-05-05 DIAGNOSIS — C139 Malignant neoplasm of hypopharynx, unspecified: Secondary | ICD-10-CM | POA: Diagnosis present

## 2014-05-05 DIAGNOSIS — Z8521 Personal history of malignant neoplasm of larynx: Secondary | ICD-10-CM | POA: Diagnosis not present

## 2014-05-05 DIAGNOSIS — R509 Fever, unspecified: Secondary | ICD-10-CM | POA: Diagnosis not present

## 2014-05-05 DIAGNOSIS — I5022 Chronic systolic (congestive) heart failure: Secondary | ICD-10-CM | POA: Diagnosis not present

## 2014-05-05 LAB — CBC
HCT: 32 % — ABNORMAL LOW (ref 39.0–52.0)
HEMATOCRIT: 29.7 % — AB (ref 39.0–52.0)
Hemoglobin: 10.7 g/dL — ABNORMAL LOW (ref 13.0–17.0)
Hemoglobin: 9.9 g/dL — ABNORMAL LOW (ref 13.0–17.0)
MCH: 26.8 pg (ref 26.0–34.0)
MCH: 27 pg (ref 26.0–34.0)
MCHC: 33.4 g/dL (ref 30.0–36.0)
MCHC: 33.3 g/dL (ref 30.0–36.0)
MCV: 80 fL (ref 78.0–100.0)
MCV: 80.9 fL (ref 78.0–100.0)
Platelets: 209 10*3/uL (ref 150–400)
Platelets: 211 10*3/uL (ref 150–400)
RBC: 4 MIL/uL — ABNORMAL LOW (ref 4.22–5.81)
RBC: 3.67 MIL/uL — ABNORMAL LOW (ref 4.22–5.81)
RDW: 17.5 % — ABNORMAL HIGH (ref 11.5–15.5)
RDW: 17.7 % — AB (ref 11.5–15.5)
WBC: 8.9 10*3/uL (ref 4.0–10.5)
WBC: 9.3 10*3/uL (ref 4.0–10.5)

## 2014-05-05 LAB — BASIC METABOLIC PANEL
ANION GAP: 12 (ref 5–15)
BUN: 32 mg/dL — ABNORMAL HIGH (ref 6–23)
CO2: 21 mmol/L (ref 19–32)
CREATININE: 1.14 mg/dL (ref 0.50–1.35)
Calcium: 8.7 mg/dL (ref 8.4–10.5)
Chloride: 104 mmol/L (ref 96–112)
GFR calc Af Amer: 78 mL/min — ABNORMAL LOW (ref >90)
GFR calc non Af Amer: 68 mL/min — ABNORMAL LOW (ref >90)
Glucose, Bld: 89 mg/dL (ref 70–99)
Potassium: 5.3 mmol/L — ABNORMAL HIGH (ref 3.5–5.1)
Sodium: 137 mmol/L (ref 135–145)

## 2014-05-05 LAB — GLUCOSE, CAPILLARY
GLUCOSE-CAPILLARY: 77 mg/dL (ref 70–99)
GLUCOSE-CAPILLARY: 84 mg/dL (ref 70–99)
Glucose-Capillary: 120 mg/dL — ABNORMAL HIGH (ref 70–99)

## 2014-05-05 LAB — CREATININE, SERUM
Creatinine, Ser: 1.18 mg/dL (ref 0.50–1.35)
GFR calc Af Amer: 75 mL/min — ABNORMAL LOW (ref >90)
GFR calc non Af Amer: 65 mL/min — ABNORMAL LOW (ref >90)

## 2014-05-05 LAB — LACTIC ACID, PLASMA
Lactic Acid, Venous: 1.4 mmol/L (ref 0.5–2.0)
Lactic Acid, Venous: 1.3 mmol/L (ref 0.5–2.0)

## 2014-05-05 MED ORDER — CHLORHEXIDINE GLUCONATE 0.12 % MT SOLN
15.0000 mL | Freq: Two times a day (BID) | OROMUCOSAL | Status: DC
  Administered 2014-05-05 – 2014-05-07 (×4): 15 mL via OROMUCOSAL
  Filled 2014-05-05 (×6): qty 15

## 2014-05-05 MED ORDER — MAGIC MOUTHWASH W/LIDOCAINE
5.0000 mL | Freq: Four times a day (QID) | ORAL | Status: DC
  Administered 2014-05-05 – 2014-05-07 (×10): 5 mL via ORAL
  Filled 2014-05-05 (×13): qty 5

## 2014-05-05 MED ORDER — SODIUM CHLORIDE 0.45 % IV SOLN
INTRAVENOUS | Status: DC
Start: 2014-05-05 — End: 2014-05-07
  Administered 2014-05-05: 75 mL/h via INTRAVENOUS
  Administered 2014-05-05 – 2014-05-06 (×3): via INTRAVENOUS

## 2014-05-05 MED ORDER — IBUPROFEN 200 MG PO TABS
400.0000 mg | ORAL_TABLET | Freq: Four times a day (QID) | ORAL | Status: DC | PRN
  Administered 2014-05-05: 400 mg via ORAL
  Filled 2014-05-05: qty 2

## 2014-05-05 MED ORDER — FENTANYL 25 MCG/HR TD PT72: 25.0000 ug | MEDICATED_PATCH | TRANSDERMAL | Status: DC

## 2014-05-05 MED ORDER — JEVITY 1.2 CAL PO LIQD
1000.0000 mL | ORAL | Status: DC
  Administered 2014-05-05: 50 mL
  Filled 2014-05-05 (×3): qty 1000

## 2014-05-05 MED ORDER — ONDANSETRON HCL 4 MG/2ML IJ SOLN
2.0000 mg | Freq: Four times a day (QID) | INTRAMUSCULAR | Status: DC | PRN
  Administered 2014-05-05: 2 mg via INTRAVENOUS
  Filled 2014-05-05: qty 2

## 2014-05-05 MED ORDER — DEXTROSE 5 % IV SOLN
1.0000 g | Freq: Two times a day (BID) | INTRAVENOUS | Status: DC
  Administered 2014-05-06 – 2014-05-07 (×3): 1 g via INTRAVENOUS
  Filled 2014-05-05 (×6): qty 1

## 2014-05-05 MED ORDER — PANTOPRAZOLE SODIUM 40 MG IV SOLR
40.0000 mg | Freq: Every day | INTRAVENOUS | Status: DC
  Administered 2014-05-05 – 2014-05-07 (×3): 40 mg via INTRAVENOUS
  Filled 2014-05-05 (×4): qty 40

## 2014-05-05 MED ORDER — OXYCODONE HCL 5 MG/5ML PO SOLN
5.0000 mg | ORAL | Status: DC | PRN
  Administered 2014-05-06 – 2014-05-07 (×3): 5 mg via ORAL
  Filled 2014-05-05 (×3): qty 5

## 2014-05-05 MED ORDER — GUAIFENESIN 100 MG/5ML PO SOLN
15.0000 mL | Freq: Two times a day (BID) | ORAL | Status: DC | PRN
  Filled 2014-05-05: qty 15

## 2014-05-05 MED ORDER — JEVITY 1.2 CAL PO LIQD
1000.0000 mL | ORAL | Status: DC
  Administered 2014-05-05: 1000 mL
  Administered 2014-05-07: 01:00:00
  Filled 2014-05-05 (×5): qty 1000

## 2014-05-05 MED ORDER — DICLOFENAC SODIUM 1 % TD GEL
2.0000 g | Freq: Four times a day (QID) | TRANSDERMAL | Status: DC | PRN
Start: 2014-05-05 — End: 2014-05-07

## 2014-05-05 MED ORDER — SODIUM CHLORIDE 0.9 % IV BOLUS (SEPSIS)
1000.0000 mL | Freq: Once | INTRAVENOUS | Status: AC
  Administered 2014-05-05: 1000 mL via INTRAVENOUS

## 2014-05-05 MED ORDER — CETYLPYRIDINIUM CHLORIDE 0.05 % MT LIQD: 7.0000 mL | Freq: Two times a day (BID) | OROMUCOSAL | Status: DC

## 2014-05-05 MED ORDER — SODIUM CHLORIDE 0.9 % IJ SOLN
3.0000 mL | Freq: Two times a day (BID) | INTRAMUSCULAR | Status: DC
  Administered 2014-05-05 – 2014-05-06 (×2): 3 mL via INTRAVENOUS

## 2014-05-05 MED ORDER — CETYLPYRIDINIUM CHLORIDE 0.05 % MT LIQD
7.0000 mL | Freq: Two times a day (BID) | OROMUCOSAL | Status: DC
  Administered 2014-05-05 – 2014-05-07 (×4): 7 mL via OROMUCOSAL

## 2014-05-05 MED ORDER — LEVOTHYROXINE SODIUM 100 MCG IV SOLR
100.0000 ug | Freq: Every day | INTRAVENOUS | Status: DC
  Administered 2014-05-05 – 2014-05-06 (×2): 100 ug via INTRAVENOUS
  Filled 2014-05-05 (×4): qty 5

## 2014-05-05 MED ORDER — MEGESTROL ACETATE 40 MG/ML PO SUSP
400.0000 mg | Freq: Two times a day (BID) | ORAL | Status: DC
  Administered 2014-05-05 – 2014-05-07 (×5): 400 mg via ORAL
  Filled 2014-05-05 (×7): qty 10

## 2014-05-05 MED ORDER — VANCOMYCIN HCL IN DEXTROSE 1-5 GM/200ML-% IV SOLN
1000.0000 mg | INTRAVENOUS | Status: DC
  Administered 2014-05-06 – 2014-05-07 (×2): 1000 mg via INTRAVENOUS
  Filled 2014-05-05 (×2): qty 200

## 2014-05-05 MED ORDER — HEPARIN SODIUM (PORCINE) 5000 UNIT/ML IJ SOLN
5000.0000 [IU] | Freq: Three times a day (TID) | INTRAMUSCULAR | Status: DC
  Administered 2014-05-05 – 2014-05-07 (×8): 5000 [IU] via SUBCUTANEOUS
  Filled 2014-05-05 (×10): qty 1

## 2014-05-05 NOTE — H&P (Signed)
[facility] Admission History and Physical Service Pager: [phone]  Patient name: [name] Medical record number: [id number] Date of birth: Aug 01, 1950 Age: 64 y.o. Gender: male  Primary Care Provider: Howard Pouch, DO Consultants: None Code Status: Full (confirmed this admission)   Chief Complaint: Low blood pressures.   Assessment and Plan: [name] is a 64 y.o. male presenting with Low blood pressures and dizziness at home and AKI found in the ED. 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 Presented to the ED from home after being sent by home RN due to low bp's with systolic bp in the 47'Q. He was found to be dehydrated in the ED at [facility] with blood pressure initially 67/48 but subsequently coming up to 100's / 60's with rehydration. No tachycardia during this admission. Recent placement of G-tube due to odynophagia and malnutrition at hospital admission at [facility]. No issues with feeds at home, but getting only meals TID and saline syringe flushes 5 times daily presumably without fluid supplementation. Cr up to 1.49 from 0.93 at discharge previously. U/A negative. S/P 2 L NS. Started on 125cc/hr. Blood pressures improving.  - Admitted to SDU 2/2 hypotension under Dr. Lindell Noe - Monitor VS closely.  - Started on 125cc/hr NS, in effort to be careful with EF 45% 05/2012, decreased to 100cc/hr x 4-5 hrs, then 75cc/hr. - Trend BMET - Consult to Nutrition for G-tube feeds  - Teaching regarding need for hydration and feeds at home.  - Lactic acid elevated > 3. Will trend.  - If BP again drops or difficulty increasing, can d/c fentanyl patch.  # Rule out Sepsis - Pt. Here hypotensive,  lactic acid elevated, WBC 11.7, Not tachycardic with no reported fever. Recent G-tube placement but does not appear to be infected. No focal physical exam findings, and pt. appears clinically at his baseline. No other obvious source of infection. At this point, think that his symptoms are mostly related to dehydration rather than sepsis. Monitoring closely however.  - Trending vitals > BP improving with fluid resuscitation.  - Trend lactic acid and obtained blood cultures. - Low threshold to add empiric coverage.   # SCC of Larynx / Pharynx -  Followed by [facility] ENT. Last admitted there on 2/10 - 2/14 for malnutrition by ENT after prior admission at [facility] for dehydration and syncope at which time second mass was found in his pharynx in the setting of recent weight loss. At [facility] ENT admission, G-tube was placed. He was also seen by radiation oncology who felt that he would be a candidate for their "reirradiation" protocol but were discussing that with the tumor board there. He was scheduled for further outpatient follow up with them as well as with ENT on 3/1 (which he will miss today due to this hospitalization). He was referred to [facility] outpatient palliative care by our palliative care team during his last admission at the request of himself and his daughter. At this time he is a full code confirmed this admission.  - Will call to cancel ENT appt. Tomorrow and  FYI to ENT about his admission here.  - f/U with daughter regarding further follow up and plan regarding rad / onc as well  as with onc here in [place].  - Call Palliative Care in AM - Otherwise continue palliative meds from home for help with odynophagia.  - Does not take pills po > SLP again this admission and converted what meds we could to IV, solution via tube, or crush and admin via tube.  #Systolic CHF: echo 10/07/3823 shows EF 50-55%, focal basal hypertrophy of septum, and grade 1 LV diastolic dysfunction (previously moderate LVH, EF 45-50%, w/ grade  1 diastolic dysfunction in 0539) - Monitoring fluid status closely  - Strict I/O's.   # HTN - hypotensive here. On Norvasc 5mg  daily at home. As well as zebeta and hydralazine, lisinopril by history.  - Holding antihypertensives currently.  - Will likely need to restart beta blocker soon to avoid reflex tachycardia. At that time, can crush and admin per tube.  # Severe Protein - Calorie Malnutrition -  Noted on previous admission. Albumin of 2.7 in the past, Albumin 3.5 here. Has Creon supplement and megace. G-tube in place.  - Continue meal supplements here.  - nutrition consultation.   #Cirrhosis of liver: LFTs WNL, liver not palpably enlarged. Platelets preserved. Albumin 3.5.   #ETOH dependence: History of this.  -CIWA here - Thiamine, Multivitamin.  - MCV 80.   #Chronic pancreatitis: on Creon at home. Lipase 18 on 2/3. No abdominal pain at this time. G-tube in place.  -Continue home medication once able to PO.  #Hypothyroidism: Synthroid 250 mcg PO.  TSH <0.01 at last admission. Decreased 2/8 to 25mcg PO daily. - IV equivalent dosing of 193mcg daily ordered. - Will need further outpatient hypothyroidism follow up and mgmt.   #Hyperlipidemia - Holding home aspirin and lipitor given unable to take pills PO currently due to pain. Question utility of this medication in Mr [place] as well.  FEN/GI: nutrition consult, can use G-tube. SLP for po. 1/2 NS at 125cc/hr>>100cc/hr>>75cc/hr. Continue PPI. Prophylaxis: Sub q heparin  Disposition: Admission for rehydration and nutritional supplementation.   History of Present Illness: [name] is a 64 y.o. male presenting with hypotension from home. Home RN was at his house today, and noted that he had a systolic BP of 60 despite repeat measurements. She recommended that he go to the ED. He had been dizzy intermittently at home for 2-3 days. He was recently admitted to [facility] from 2/10 - 2/14 where he had G-tube placed due to  malnutrition. He has been using this at home, but getting feeds only 3 times daily, and for unclear reasons, what appear per his description to be only five 10-cc saline flushes daily. Additionally , he complains of some constipation, though he reports that his last BM was today. His urine output has been normal. He denies fever, chills, nausea or vomiting. He reports some difficulty stooling. He denies abdominal pain, difficulty breathing, or chest pain. His throat pain is at baseline, and he says that he has had good pain control with his outpatient pain control regimen. He says that they have had no issues with the G-tube at home, and that the site has not been tender or irritated. He denies sick contacts. He had appt planned with ENT 3/1.  Review Of Systems: Per HPI with the following additions: None Otherwise 12 point review of systems was performed and was unremarkable.  Patient Active Problem List   Diagnosis Date Noted  . Throat cancer   . Esophageal mass   . Weight loss   . Odynophagia   . Palliative care encounter   .  Severe malnutrition 04/10/2014  . Protein-calorie malnutrition, severe 04/10/2014  . Syncope 04/09/2014  . LOC (loss of consciousness) 04/09/2014  . Anorexia   . Pancreatitis, chronic 05/20/2013  . Adrenal insufficiency, primary, iatrogenic 05/20/2013  . Hyperlipidemia   . Osteoarthritis   . Hypotension, unspecified 05/10/2013  . Dehydration 05/08/2013  . Chest pain 03/12/2013  . Loss of weight 03/12/2013  . Sore throat 02/07/2013  . Pain in joint, upper arm 11/28/2012  . Elevated serum creatinine 11/28/2012  . Chronic systolic heart failure 03/08/7251  . Alcohol dependence 01/04/2012  . Decreased appetite 01/03/2012  . Acute and chronic respiratory failure 12/26/2011  . Acute systolic HF (heart failure) 12/26/2011  . 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 Dyspnea 11/10/2011  . Allergic rhinitis 06/22/2011  .  Hypothyroidism 10/16/2010  . NEOPLASM, MALIGNANT, LARYNX 01/10/2010  . PULMONARY NODULE 12/31/2007  . Essential hypertension, benign 11/08/2007   Past Medical History: Past Medical History  Diagnosis Date  . Hypertension   . Cirrhosis of liver   . Hypothyroidism   . Alcohol abuse   . CHF (congestive heart failure)     EF 45-50% 05/2012  . Hyperlipidemia   . Arthritis   . Laryngeal cancer 2011    Laryngectomy, T3N0M0   Past Surgical History: Past Surgical History  Procedure Laterality Date  . Tracheal surgery    . Tracheostomy    . Left and right heart catheterization with coronary angiogram N/A 12/28/2011    Procedure: LEFT AND RIGHT HEART CATHETERIZATION WITH CORONARY ANGIOGRAM;  Surgeon: Jolaine Artist, MD;  Location: [facility] CATH LAB;  Service: Cardiovascular;  Laterality: N/A;  . Esophagogastroduodenoscopy N/A 04/10/2014    Procedure: ESOPHAGOGASTRODUODENOSCOPY (EGD);  Surgeon: Missy Sabins, MD;  Location: [facility] ENDOSCOPY;  Service: Endoscopy;  Laterality: N/A;   Social History: History  Substance Use Topics  . Smoking status: Former Smoker -- 0.20 packs/day for 50 years    Types: Cigarettes  . Smokeless tobacco: Never Used  . Alcohol Use: Yes     Comment: "rarely"  Lives with daughter who helps administer medications and G-tube feeds.  Additional social history: None  Please also refer to relevant sections of EMR.  Family History: History reviewed. No pertinent family history. Allergies and Medications: Allergies  Allergen Reactions  . Vicodin [Hydrocodone-Acetaminophen] Rash  . Other     Unknown anesthesia medicine caused cardiac arrest.  . Tylenol [Acetaminophen]     HBP -Liver Sorosis   No current facility-administered medications on file prior to encounter.   Current Outpatient Prescriptions on File Prior to Encounter  Medication Sig Dispense Refill  . Alum & Mag Hydroxide-Simeth (MAGIC MOUTHWASH W/LIDOCAINE) SOLN Take 5 mLs by mouth 3 (three) times daily.  (Patient taking differently: Take 5 mLs by mouth 4 (four) times daily. ) 120 mL 0  . amLODipine (NORVASC) 5 MG tablet Take 1 tablet (5 mg total) by mouth daily. NEEDS OFFICE VISIT 30 tablet 0  . bisoprolol (ZEBETA) 10 MG tablet TAKE 1 TABLET BY MOUTH EVERY DAY 30 tablet 0  . diclofenac sodium (VOLTAREN) 1 % GEL Apply 2 g topically 4 (four) times daily as needed (pain).     Marland Kitchen guaiFENesin (MUCINEX) 600 MG 12 hr tablet Take 600 mg by mouth daily as needed for cough or to loosen phlegm.     . hydrALAZINE (APRESOLINE) 25 MG tablet TAKE 2 BY MOUTH TWICE DAILY AT LUNCH AND AFTER DINNER. Follow up with PCP. (Patient taking differently: Take 25  mg by mouth 2 (two) times daily. TWICE DAILY AT LUNCH AND AFTER DINNER. Follow up with PCP.) 60 tablet 0  . levothyroxine (SYNTHROID, LEVOTHROID) 200 MCG tablet Take 1 tablet (200 mcg total) by mouth daily before breakfast. 30 tablet 0  . lipase/protease/amylase (CREON-12/PANCREASE) 12000 UNITS CPEP capsule Take 1 capsule by mouth 3 (three) times daily before meals. 270 capsule 4  . lisinopril (PRINIVIL,ZESTRIL) 10 MG tablet Take 1 tablet (10 mg total) by mouth daily. 90 tablet 3  . ondansetron (ZOFRAN) 4 MG tablet Take 1 tablet (4 mg total) by mouth every 8 (eight) hours as needed for nausea or vomiting. 60 tablet 0  . oxyCODONE (ROXICODONE) 5 MG/5ML solution Take 5 mLs (5 mg total) by mouth every 4 (four) hours as needed for severe pain. 90 mL 0  . atorvastatin (LIPITOR) 20 MG tablet Take 1 tablet (20 mg total) by mouth daily. (Patient not taking: Reported on 04/08/2014) 30 tablet 7  . pantoprazole (PROTONIX) 40 MG tablet Take 1 tablet (40 mg total) by mouth at bedtime. (Patient not taking: Reported on 05/04/2014) 30 tablet 11    Objective: BP 103/65 mmHg  Pulse 70  Temp(Src) 98 F (36.7 C) (Oral)  Resp 19  Ht 5\' 11"  (1.803 m)  Wt 110 lb 10.7 oz (50.2 kg)  BMI 15.44 kg/m2  SpO2 100% Exam: General: NAD, AAOx3, Resting comfortably in bed. Thin  HEENT: NCAT,  PERRLA, EOMI, No masses palpable, O/P clear, MMM; stoma well-healed and hypopigmented Cardiovascular: RRR, No MGR, Normal S1/S2, 2+ distal pulses  Respiratory: CTA Bilaterally, No wheezes, crackles, or rales.  Abdomen: S, NT, ND, +BS, G-tube in place. Site C/D/I. No erythema or tenderness. No organomegaly.  Extremities: WWP, 2+ distal pulses, emaciated. No calf swelling or tenderness or asymmetry. Skin: No rashes, no lesions, no skin tenting, cap refill <3 seconds Neuro: No focal  deficits. Pt does not speak but rather mouths words, participates in conversation appropriately  Labs and Imaging: CBC BMET   Recent Labs Lab 05/04/14 1513  WBC 11.7*  HGB 10.8*  HCT 32.8*  PLT 267    Recent Labs Lab 05/04/14 1513  NA 135  K 4.9  CL 98  CO2 27  BUN 51*  CREATININE 1.49*  GLUCOSE 105*  CALCIUM 9.7     Urinalysis    Component Value Date/Time   COLORURINE YELLOW 05/04/2014 1944   APPEARANCEUR CLEAR 05/04/2014 1944   LABSPEC 1.014 05/04/2014 1944   PHURINE 8.0 05/04/2014 1944   GLUCOSEU NEGATIVE 05/04/2014 1944   HGBUR NEGATIVE 05/04/2014 1944   BILIRUBINUR NEGATIVE 05/04/2014 1944   KETONESUR NEGATIVE 05/04/2014 1944   PROTEINUR NEGATIVE 05/04/2014 1944   UROBILINOGEN 1.0 05/04/2014 1944   NITRITE NEGATIVE 05/04/2014 1944   LEUKOCYTESUR NEGATIVE 05/04/2014 1944   Blood Cx - pending.   Cr - 1.49 > 1.18 Lactic Acid 3.69    Aquilla Hacker, MD 05/05/2014, 12:16 AM PGY-1, [place] Intern pager: [phone], text pages welcome   I have seen and examined this patient with Dr Minda Ditto and agree with his assessment and plan with my additions in blue. Hilton Sinclair, MD PGY-3, [place]

## 2014-05-05 NOTE — Progress Notes (Signed)
INITIAL NUTRITION ASSESSMENT  DOCUMENTATION CODES Per approved criteria  -Severe malnutrition in the context of chronic illness -Underweight   Pt meets criteria for severe malnutrition in the context of chronic illness as evidenced by <75% energy intake for >1 month, and severe subcutaneous body fat and muscle mass depletion.  INTERVENTION: Initiate Jevity 1.2 via G-tube at 20 ml/h and increase by 10 ml every 8 hrs to goal rate of 60 ml/hr  Provides 1728 kcal (98% of estimated energy needs), 80 grams of protein, and 1162 ml of H20   NUTRITION DIAGNOSIS: Inadequate oral intake related to inability to eat as evidenced by NPO status.   Goal: Pt to meet >/= 90% of estimated energy requirements within 3 days  Monitor:  Labs (Mg, Ph, K), TF tolerance/adequacy, weight  Reason for Assessment: Consult for G-tube feeds  64 y.o. male  Admitting Dx: Hypotension  ASSESSMENT: 64 y/o male presenting with low blood pressure and dizziness at home. AKI was found in the ED. 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   Due to history of questionable nutrition through G-tube and severe PCM, the patient is at a risk for refeeding syndrome.  Labs- elevated potassium, BUN Spoke with nurse about pt history.  Pt is nonverbal, and was not able to provide much information about normal tube feeding regime. Pt did confirm continuous TFs.  Continue to monitor Mg, Ph, K.  Nutrition Focused Physical Exam:  Subcutaneous Fat:  Orbital Region: severe depletion Upper Arm Region: severe depletion Thoracic and Lumbar Region: severe depletion  Muscle:  Temple Region: severe depletion Clavicle Bone Region: severe depletion Clavicle and Acromion Bone Region: severe  depletion Scapular Bone Region: severe depletion Dorsal Hand: severe depletion Patellar Region: severe depletion Anterior Thigh Region: severe depletion Posterior Calf Region: severe depletion  Edema: not present  Height: Ht Readings from Last 1 Encounters:  05/04/14 5\' 11"  (1.803 m)    Weight: Wt Readings from Last 1 Encounters:  05/04/14 110 lb 10.7 oz (50.2 kg)    Ideal Body Weight: 172 lb (78.2 kg)  % Ideal Body Weight: 64%  Wt Readings from Last 10 Encounters:  05/04/14 110 lb 10.7 oz (50.2 kg)  04/13/14 109 lb 14.4 oz (49.85 kg)  07/10/13 127 lb (57.607 kg)  06/10/13 121 lb (54.885 kg)  05/14/13 118 lb 13.3 oz (53.9 kg)  03/12/13 127 lb (57.607 kg)  02/07/13 134 lb (60.782 kg)  11/28/12 144 lb (65.318 kg)  03/28/12 140 lb 8 oz (63.73 kg)  01/18/12 128 lb (58.06 kg)    Usual Body Weight: unknown  % Usual Body Weight: -  BMI:  Body mass index is 15.44 kg/(m^2). underweight  Estimated Nutritional Needs: Kcal: 1750-2000  Protein: 80-95 grams Fluid: >/= 1.7 L daily  Skin: neck dissection for supraglottic SCC  Diet Order:  NPO  EDUCATION NEEDS: -No education needs identified at this time   Intake/Output Summary (Last 24 hours) at 05/05/14 1339 Last data filed at 05/05/14 1200  Gross per 24 hour  Intake 1694.16 ml  Output   1654 ml  Net  40.16 ml    Last BM: PTA   Labs:   Recent Labs Lab 05/04/14 1513 05/05/14 0139 05/05/14 0517  NA 135  --  137  K 4.9  --  5.3*  CL 98  --  104  CO2 27  --  21  BUN 51*  --  32*  CREATININE 1.49* 1.18 1.14  CALCIUM 9.7  --  8.7  GLUCOSE 105*  --  89    CBG (last 3)  No results for input(s): GLUCAP in the last 72 hours.  Scheduled Meds: . antiseptic oral rinse  7 mL Mouth Rinse BID  . fentaNYL  25 mcg Transdermal Q72H  . heparin  5,000 Units Subcutaneous 3 times per day  . levothyroxine  100 mcg Intravenous QAC breakfast  . magic mouthwash w/lidocaine  5 mL Oral QID  . megestrol  400 mg Oral BID   . pantoprazole (PROTONIX) IV  40 mg Intravenous QHS  . sodium chloride  3 mL Intravenous Q12H    Continuous Infusions: . sodium chloride 75 mL/hr at 05/05/14 1200  . feeding supplement (JEVITY 1.2 CAL)      Past Medical History  Diagnosis Date  . Hypertension   . Cirrhosis of liver   . Hypothyroidism   . Alcohol abuse   . CHF (congestive heart failure)     EF 45-50% 05/2012  . Hyperlipidemia   . Arthritis   . Laryngeal cancer 2011    Laryngectomy, T3N0M0    Past Surgical History  Procedure Laterality Date  . Tracheal surgery    . Tracheostomy    . Left and right heart catheterization with coronary angiogram N/A 12/28/2011    Procedure: LEFT AND RIGHT HEART CATHETERIZATION WITH CORONARY ANGIOGRAM;  Surgeon: Jolaine Artist, MD;  Location: [facility] CATH LAB;  Service: Cardiovascular;  Laterality: N/A;  . Esophagogastroduodenoscopy N/A 04/10/2014    Procedure: ESOPHAGOGASTRODUODENOSCOPY (EGD);  Surgeon: Missy Sabins, MD;  Location: [facility] ENDOSCOPY;  Service: Endoscopy;  Laterality: N/A;    Wynona Dove, MS Dietetic Intern Pager: [phone]   I agree with student dietitian note; appropriate revisions have been made.  Molli Barrows, RD, LDN, [place] Pager# [phone] After Hours Pager# [phone]

## 2014-05-05 NOTE — Progress Notes (Signed)
ANTIBIOTIC CONSULT NOTE - INITIAL  Pharmacy Consult for Vancomycin/Cefepime  Indication: rule out sepsis  Allergies  Allergen Reactions  . Vicodin [Hydrocodone-Acetaminophen] Rash  . Other     Unknown anesthesia medicine caused cardiac arrest.  . Tylenol [Acetaminophen]     HBP -Liver Sorosis    Patient Measurements: Height: 5\' 11"  (180.3 cm) Weight: 110 lb 10.7 oz (50.2 kg) IBW/kg (Calculated) : 75.3  Vital Signs: Temp: 103.2 F (39.6 C) (03/01 2322) Temp Source: Rectal (03/01 2322) BP: 88/58 mmHg (03/01 2322) Pulse Rate: 113 (03/01 2322)  Labs:  Recent Labs  05/04/14 1513 05/05/14 0139 05/05/14 0517  WBC 11.7* 8.9 9.3  HGB 10.8* 10.7* 9.9*  PLT 267 209 211  CREATININE 1.49* 1.18 1.14   Estimated Creatinine Clearance: 48.3 mL/min (by C-G formula based on Cr of 1.14).    Medical History: Past Medical History  Diagnosis Date  . Hypertension   . Cirrhosis of liver   . Hypothyroidism   . Alcohol abuse   . CHF (congestive heart failure)     EF 45-50% 05/2012  . Hyperlipidemia   . Arthritis   . Laryngeal cancer 2011    Laryngectomy, T3N0M0    Assessment: 64 y/o M here with hypotension, WBC mildly elevated, some AKI due to dehydration, recent G-tube placement due to [facility] of larynx, other labs as above.   Goal of Therapy:  Vancomycin trough level 15-20 mcg/ml  Plan:  -Vancomycin 1000 mg IV q24h -Cefepime 1g IV q12h -Trend WBC, temp, renal function  -Drug levels as indicated   Narda Bonds 05/05/2014,11:42 PM

## 2014-05-05 NOTE — Progress Notes (Signed)
Family Medicine Teaching Service Daily Progress Note Intern Pager: [phone]  Patient name: [name] Medical record number: [id number] Date of birth: 03-09-1950 Age: 64 y.o. Gender: male  Primary Care Provider: Howard Pouch, DO Consultants: Palliative Care, Nutrition Code Status: Full (confirmed this admission)  Pt Overview and Major Events to Date:  09/2009: Radical Neck Dissection for supraglottic [facility] 2/10-14, 2016: New primary SCC of hypopharynx, g-tube placed 2/29: Admitted with symptomatic hypotension from home health  Assessment and Plan: [name] is a 64 y.o. male presenting with Low blood pressures and dizziness at home and AKI found in the ED. 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 Blood pressures improving.  BPs improved to 100's/60's 3/1AM  -  75cc/hr 1/2NS. - Trend BMET - Consult to Nutrition for G-tube feeds, adult tube feeding protocol ordered  - Teaching regarding need for hydration and feeds at home.  - If BP again drops or difficulty increasing, can d/c fentanyl patch.  # Rule out Sepsis - WBC 11.7 on admission -> 4.6 after rehydration, Not tachycardic with no reported fever. Currently 0/4 SIRS criteria, RR intermittently >20 overnight. Recent G-tube placement but does not appear to be infected. No focal physical exam findings, and pt. appears clinically at his baseline. No other obvious source of infection. At this point, think that his symptoms are mostly related to dehydration rather than sepsis. Monitoring closely however.  - Trending vitals > BP improving with fluid resuscitation.  - blood  cultures: pending.  # SCC of Larynx / Pharynx - Followed by [facility] ENT.  - Called to cancel ENT appt, left message 3/1AM.  - f/U with daughter regarding further follow up and plan regarding rad / onc as well as with onc here in [place] consulted in AM, appreciate recs - Does not take pills po > SLP again this admission and converted what meds we could to IV, solution via tube, or crush and admin via tube.  #Systolic CHF: echo 04/07/252 shows EF 50-55%, focal basal hypertrophy of septum, and grade 1 LV diastolic dysfunction (previously moderate LVH, EF 45-50%, w/ grade 1 diastolic dysfunction in 2706) - Monitoring fluid status closely  - Strict I/O's.   # HTN - hypotensive here. On Norvasc 5mg  daily at home. As well as zebeta and hydralazine, lisinopril by history.  - Holding antihypertensives currently.  - Will likely need to restart beta blocker soon to avoid reflex tachycardia. At that time, can crush and admin per tube.  # Severe Protein - Calorie Malnutrition - Noted on previous admission. Albumin of 2.7 in the past, Albumin 3.5 here. Has Creon supplement and megace. G-tube in place. Due to history of questionable nutrition through g-tube, the patient is at a risk of refeeding syndrome. Checking magnesium and phosphorous 3/1 and continue to monitor. - Continue meal supplements here.  - Mg, PO4 sent 3/1 - nutrition consultation, ordered tube feeding protocol. - tube feeds initiated   - air bed for patient with erythema noticed this morning.  - barrier cream as needed   #Cirrhosis of liver: LFTs WNL, liver not palpably enlarged. Platelets preserved. Albumin 3.5.   #  ETOH dependence: History of. - CIWA here - Thiamine, Multivitamin.  - MCV 80.   #Chronic pancreatitis: on Creon at home. Lipase 18 on 2/3. No abdominal pain at this time. G-tube in place.  -Continue home medication once able to PO.  #Hypothyroidism: TSH <0.01 at last admission. Decreased 2/8 to  281mcg PO daily. - Synthroid 250 mcg PO.   - Will need further outpatient hypothyroidism follow up and mgmt.   #Hyperlipidemia - Holding home aspirin and lipitor   FEN/GI: nutrition consult, can use G-tube. SLP for po. 1/2 NS at 125cc/hr>>100cc/hr>>75cc/hr. Continue PPI. Prophylaxis: Sub q heparin, air bed  Disposition: Will continue in step down today and most likely will transfer to floor tomorrow.   Subjective:  No acute events overnight. Per nursing, pt had one episode of fecal incontinence this AM. On cleaning patient, they noticed some sacral erythema. Due to the patient's habitus, he is at a higher risk of pressure ulcers. They recommended that the patient use an air mattress. Patient was woken from sleep, but stated that he was hungry and that he is not in pain. Endorsed desire for palliative care consult.  Objective: Temp:  [98 F (36.7 C)-98.9 F (37.2 C)] 98.5 F (36.9 C) (03/01 0700) Pulse Rate:  [67-99] 69 (03/01 0700) Resp:  [12-25] 16 (03/01 0700) BP: (67-158)/(48-100) 104/75 mmHg (03/01 0700) SpO2:  [95 %-100 %] 100 % (03/01 0700) Weight:  [50.2 kg (110 lb 10.7 oz)] 50.2 kg (110 lb 10.7 oz) (02/29 2138) Physical Exam: General: NAD, A&Ox3, Resting comfortably in bed. Cachectic   HEENT: NCAT,  O/P clear, MMM; stoma well-healed and hypopigmented Cardiovascular: RRR, No MRG, Normal S1/S2, 2+ distal pulses  Respiratory: CTA Bilaterally, No increased WOB Abdomen: S, NT, ND, +BS, G-tube in place. Site C/D/I. No erythema or tenderness. No organomegaly.  Extremities: WWP, 2+ distal pulses, emaciated. No calf swelling or tenderness or asymmetry. Skin: No rashes, no lesions, no skin tenting, cap refill <3 seconds Neuro: No focal deficits. Pt does not speak but rather mouths words, participates in conversation appropriately   Laboratory:  Recent Labs Lab 05/04/14 1513 05/05/14 0139 05/05/14 0517  WBC 11.7* 8.9 9.3  HGB 10.8* 10.7* 9.9*  HCT 32.8* 32.0* 29.7*   PLT 267 209 211    Recent Labs Lab 05/04/14 1513 05/05/14 0139  NA 135  --   K 4.9  --   CL 98  --   CO2 27  --   BUN 51*  --   CREATININE 1.49* 1.18  CALCIUM 9.7  --   PROT 8.0  --   BILITOT 0.9  --   ALKPHOS 101  --   ALT 37  --   AST 31  --   GLUCOSE 105*  --     Imaging/Diagnostic Tests: Reviewed CT scans from 04/09/2014 in Epic -New irregular mucosal enhancement along the soft palate and extending to palatine tonsils bilaterally, more prominently on the left. This may be related to acute inflammation. Tumor recurrence is considered less likely but not excluded. Recommend direct visualization.  Donnelly Angelica, Med Student 05/05/2014, 8:37 AM Pager: [phone], text pages welcome  Upper Level Addendum:  I have seen and evaluated this patient along with MS Mr. Grandville Silos and reviewed the above note, making necessary revisions in [facility].   Clearance Coots, MD Family Medicine PGY-2

## 2014-05-06 ENCOUNTER — Inpatient Hospital Stay (HOSPITAL_COMMUNITY): Payer: Medicare Other

## 2014-05-06 DIAGNOSIS — A419 Sepsis, unspecified organism: Secondary | ICD-10-CM

## 2014-05-06 DIAGNOSIS — E43 Unspecified severe protein-calorie malnutrition: Secondary | ICD-10-CM

## 2014-05-06 DIAGNOSIS — C329 Malignant neoplasm of larynx, unspecified: Secondary | ICD-10-CM

## 2014-05-06 DIAGNOSIS — I10 Essential (primary) hypertension: Secondary | ICD-10-CM

## 2014-05-06 DIAGNOSIS — R6510 Systemic inflammatory response syndrome (SIRS) of non-infectious origin without acute organ dysfunction: Secondary | ICD-10-CM | POA: Insufficient documentation

## 2014-05-06 DIAGNOSIS — R569 Unspecified convulsions: Secondary | ICD-10-CM

## 2014-05-06 DIAGNOSIS — I5022 Chronic systolic (congestive) heart failure: Secondary | ICD-10-CM

## 2014-05-06 DIAGNOSIS — R509 Fever, unspecified: Secondary | ICD-10-CM | POA: Insufficient documentation

## 2014-05-06 DIAGNOSIS — Z515 Encounter for palliative care: Secondary | ICD-10-CM

## 2014-05-06 DIAGNOSIS — R1310 Dysphagia, unspecified: Secondary | ICD-10-CM

## 2014-05-06 LAB — PHOSPHORUS: Phosphorus: 3.3 mg/dL (ref 2.3–4.6)

## 2014-05-06 LAB — GLUCOSE, CAPILLARY
GLUCOSE-CAPILLARY: 93 mg/dL (ref 70–99)
GLUCOSE-CAPILLARY: 78 mg/dL (ref 70–99)
Glucose-Capillary: 94 mg/dL (ref 70–99)
Glucose-Capillary: 104 mg/dL — ABNORMAL HIGH (ref 70–99)
Glucose-Capillary: 69 mg/dL — ABNORMAL LOW (ref 70–99)
Glucose-Capillary: 83 mg/dL (ref 70–99)

## 2014-05-06 LAB — URINALYSIS, ROUTINE W REFLEX MICROSCOPIC
Bilirubin Urine: NEGATIVE
Glucose, UA: NEGATIVE mg/dL
HGB URINE DIPSTICK: NEGATIVE
KETONES UR: NEGATIVE mg/dL
Leukocytes, UA: NEGATIVE
Nitrite: NEGATIVE
Protein, ur: NEGATIVE mg/dL
SPECIFIC GRAVITY, URINE: 1.008 (ref 1.005–1.030)
UROBILINOGEN UA: 1 mg/dL (ref 0.0–1.0)
pH: 7.5 (ref 5.0–8.0)

## 2014-05-06 LAB — CBC
HCT: 26.6 % — ABNORMAL LOW (ref 39.0–52.0)
Hemoglobin: 8.9 g/dL — ABNORMAL LOW (ref 13.0–17.0)
MCH: 27.5 pg (ref 26.0–34.0)
MCHC: 33.5 g/dL (ref 30.0–36.0)
MCV: 82.1 fL (ref 78.0–100.0)
Platelets: 174 10*3/uL (ref 150–400)
RBC: 3.24 MIL/uL — ABNORMAL LOW (ref 4.22–5.81)
RDW: 17.7 % — ABNORMAL HIGH (ref 11.5–15.5)
WBC: 5.3 10*3/uL (ref 4.0–10.5)

## 2014-05-06 LAB — LACTIC ACID, PLASMA: Lactic Acid, Venous: 1.6 mmol/L (ref 0.5–2.0)

## 2014-05-06 LAB — BASIC METABOLIC PANEL
Anion gap: 6 (ref 5–15)
BUN: 24 mg/dL — ABNORMAL HIGH (ref 6–23)
CALCIUM: 7.3 mg/dL — AB (ref 8.4–10.5)
CO2: 18 mmol/L — AB (ref 19–32)
Chloride: 109 mmol/L (ref 96–112)
Creatinine, Ser: 1.29 mg/dL (ref 0.50–1.35)
GFR calc Af Amer: 68 mL/min — ABNORMAL LOW (ref >90)
GFR calc non Af Amer: 58 mL/min — ABNORMAL LOW (ref >90)
GLUCOSE: 99 mg/dL (ref 70–99)
POTASSIUM: 4.1 mmol/L (ref 3.5–5.1)
Sodium: 133 mmol/L — ABNORMAL LOW (ref 135–145)

## 2014-05-06 LAB — MAGNESIUM: Magnesium: 2.1 mg/dL (ref 1.5–2.5)

## 2014-05-06 MED ORDER — GADOBENATE DIMEGLUMINE 529 MG/ML IV SOLN
10.0000 mL | Freq: Once | INTRAVENOUS | Status: AC | PRN
  Administered 2014-05-06: 10 mL via INTRAVENOUS

## 2014-05-06 MED ORDER — SODIUM CHLORIDE 0.9 % IV BOLUS (SEPSIS)
1000.0000 mL | Freq: Once | INTRAVENOUS | Status: AC
  Administered 2014-05-06: 1000 mL via INTRAVENOUS

## 2014-05-06 MED ORDER — LEVETIRACETAM 100 MG/ML PO SOLN
500.0000 mg | Freq: Two times a day (BID) | ORAL | Status: DC
  Administered 2014-05-06 – 2014-05-07 (×2): 500 mg
  Filled 2014-05-06 (×3): qty 5

## 2014-05-06 MED ORDER — LEVETIRACETAM 500 MG PO TABS
500.0000 mg | ORAL_TABLET | Freq: Two times a day (BID) | ORAL | Status: DC
  Filled 2014-05-06: qty 1

## 2014-05-06 MED ORDER — ACETAMINOPHEN 500 MG PO TABS
1000.0000 mg | ORAL_TABLET | Freq: Once | ORAL | Status: AC
  Administered 2014-05-06: 1000 mg via ORAL
  Filled 2014-05-06: qty 2

## 2014-05-06 MED ORDER — SENNOSIDES 8.8 MG/5ML PO SYRP
5.0000 mL | ORAL_SOLUTION | Freq: Two times a day (BID) | ORAL | Status: DC
  Administered 2014-05-06 (×2): 5 mL via ORAL
  Filled 2014-05-06 (×4): qty 5

## 2014-05-06 NOTE — Progress Notes (Signed)
BP 83/61, HR 74, patient asymptomatic. Dr. Sherril Cong notified. No new orders received at this time. Instructed to notify when BP <70/45 or patient becomes symptomatic. Will continue to monitor.   Lum Babe, RN

## 2014-05-06 NOTE — Progress Notes (Signed)
Family Medicine Teaching Service Daily Progress Note Intern Pager: [phone]  Patient name: RAFIEL MECCA Medical record number: [id number] Date of birth: Aug 31, 1950 Age: 64 y.o. Gender: male  Primary Care Provider: Howard Pouch, DO  Consultants: Palliative Care, Nutrition Code Status: Full (confirmed this admission)  Pt Overview and Major Events to Date:  09/2009: Radical Neck Dissection for supraglottic [facility] 2/10-14, 2016: New primary SCC of hypopharynx, g-tube placed 2/29: Admitted with symptomatic hypotension from home health  Assessment and Plan: HOLLAND NICKSON is a 64 y.o. male presenting with Low blood pressures and dizziness at home and AKI found in the ED. 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 He looks well on exam and is alert and oriented. Currently 3/4 SIRS criteria, tmax overnight 3/1 103.2, P113, max RR 31, WBC 5.3 Vanc and zosyn started overnight. - Trending vitals > BP improving with fluid resuscitation.  - blood cultures: NGx1day - Vancomycin and Cefepime started overnight - Repeat U/A 3/2 - Repeat Lactate 1.6  # Dehydration / AKI - Cr improved to 1.18, creatinine was 1.49 on admission. Blood pressures improving. BPs unstable overnight, suspect infectious or autonomic cause over dehydration - 75cc/hr 1/2NS. - Trend BMET - Consult to Nutrition:currently at 21mL/hr Branch regarding need for hydration and feeds at home.  - If BP again drops or difficulty increasing, can d/c fentanyl patch.  # SCC of Larynx / Pharynx - Followed by [facility]. Reached out to ENT Dr. Nicolette Bang for  recs. - ENT Dr. Susa Raring aware, recs: - f/U with daughter regarding further follow up and plan regarding rad / onc as well as with onc here in [place] consulted, recommend: MMW with Lidocaine, PRN oxycodone 5mg , senna syrup BID  - Does not take pills po > SLP again this admission and converted what meds we could to IV, solution via tube, or crush and admin via tube.  #Neurologic / Autonomic Symptoms: [name] daughter reports seizure-like symptoms, including arm twitching/shaking/jerking, falling to the floor, incontinence, and inability to walk without assist starting within the last several days. Combined with hypotension unresponsive to fluid resuscitation, there is a possibility that this constellation of symptoms is due to metastatic disease in the brain. We will order an MRI brain to evaluate this possibility, and discuss further workup with ENT. It is also possible that these symptoms are due to infection, which is being treated as above.  - MRI brain 3/2 - Discuss further workup with ENT  #Systolic CHF: echo 06/06/3293 shows EF 50-55%, focal basal hypertrophy of septum, and grade 1 LV diastolic dysfunction (previously moderate LVH, EF 45-50%, w/ grade 1 diastolic dysfunction in 1884) - Monitoring fluid status closely  - Strict I/O's.   # HTN - hypotensive here. On Norvasc 5mg  daily at home. As well as zebeta and hydralazine, lisinopril by history.  - Holding antihypertensives currently.  - Will likely need to restart beta blocker soon to avoid reflex tachycardia. At that time, can crush and admin per tube.  # Severe Protein - Calorie Malnutrition - Noted on previous admission. Albumin of 2.7 in the past, Albumin 3.5 here. Has Creon supplement and  megace. G-tube in place. Due to history of questionable nutrition through g-tube, the patient is at a risk of refeeding syndrome. Checking magnesium and phosphorous 3/1 and continue to monitor. - Continue meal supplements  here.  - Mg, PO4 wnl - nutrition consultation, ordered tube feeding protocol. - tube feeds initiated, currently at 30 mL/hr  - air bed for patient with erythema noticed this morning.  - barrier cream as needed   #Cirrhosis of liver: LFTs WNL, liver not palpably enlarged. Platelets preserved. Albumin 3.5.   #ETOH dependence: History of, hasn't had EtOH since early Feb. - Check B12, Folate - Thiamine, Multivitamin.  - MCV 80.   #Chronic pancreatitis: on Creon at home. Lipase 18 on 2/3. No abdominal pain at this time. G-tube in place.  -Continue home medication once able to PO.  #Hypothyroidism: TSH <0.01 at last admission. Decreased 2/8 to 283mcg PO daily. - Synthroid 250 mcg PO.  - Will need further outpatient hypothyroidism follow up and mgmt.   #Hyperlipidemia - Holding home aspirin and lipitor   FEN/GI: nutrition consult, can use G-tube. SLP for po. 1/2 NS at 125cc/hr>>100cc/hr>>75cc/hr. Continue PPI. Prophylaxis: Sub q heparin, air bed  Disposition: Will continue in step down today. Pending improvement   Subjective:  Overnight the patient was febrile to TMax of 103.2, hypotensive with systolics in 84'X, Vancomycin and Cefepime started. Through all of this he was relatively asymptomatic. Continues to have episodes of fecal incontinence. Denies headache and neck tenderness.   Spoke to patient and daughter this morning. She was concerned that her initial complaint of "seizure like activity" was not heard in our initial evaluation. She reports episodes of the patient twitching/shaking in bed, clenching his hands and his mouth, jerking and falling over from a seated position, and new fecal and urinary incontinence. He could previously ambulate without assist, now he cannot. Per nursing, none of these symptoms have been witnessed during the hospitalization. The daughter feels like the team has only addressed dehydration as a cause of these symptoms, and repeatedly asks if we  should transfer pt to [facility], where he will initiate chemo/radiation for 6 weeks. She also feels like the team has not communicated well with her family, and requests more frequent updates of her father's complaints.    Objective: Temp:  [98.2 F (36.8 C)-103.2 F (39.6 C)] 98.4 F (36.9 C) (03/02 0800) Pulse Rate:  [69-113] 77 (03/02 0915) Resp:  [11-32] 24 (03/02 0915) BP: (67-130)/(44-85) 119/68 mmHg (03/02 0915) SpO2:  [90 %-100 %] 96 % (03/02 0915) Weight:  [48.399 kg (106 lb 11.2 oz)] 48.399 kg (106 lb 11.2 oz) (03/02 0325) Physical Exam: General: NAD, A&Ox3, Resting comfortably in bed. Cachectic  HEENT: NCAT, O/P clear, MMM; stoma well-healed and hypopigmented Cardiovascular: RRR, No MRG, Normal S1/S2, 2+ distal pulses  Respiratory: CTA Bilaterally, No increased WOB Abdomen: S, NT, ND, +BS, G-tube in place. Site C/D/I. No erythema or tenderness. No organomegaly.  Extremities: WWP, 2+ distal pulses, emaciated. No calf swelling or tenderness or asymmetry. Skin: No rashes, no lesions, no skin tenting, cap refill <3 seconds Neuro: No focal deficits. Pt does not speak but rather mouths words, participates in conversation appropriately. 5/5 strength in LE b/l, Normal plantar- and dorsiflexion b/l, Normal sensation in LE b/l, no nuchal rigidity, no headache or vision changes.   Laboratory:  Recent Labs Lab 05/05/14 0139 05/05/14 0517 05/06/14 0327  WBC 8.9 9.3 5.3  HGB 10.7* 9.9* 8.9*  HCT 32.0* 29.7* 26.6*  PLT 209 211 174  Recent Labs Lab 05/04/14 1513 05/05/14 0139 05/05/14 0517 05/06/14 0327  NA 135  --  137 133*  K 4.9  --  5.3* 4.1  CL 98  --  104 109  CO2 27  --  21 18*  BUN 51*  --  32* 24*  CREATININE 1.49* 1.18 1.14 1.29  CALCIUM 9.7  --  8.7 7.3*  PROT 8.0  --   --   --   BILITOT 0.9  --   --   --   ALKPHOS 101  --   --   --   ALT 37  --   --   --   AST 31  --   --   --   GLUCOSE 105*  --  89 99     Imaging/Diagnostic Tests: XR Chest  05/06/2014 FINDINGS: Normal heart size and mediastinal contours. No convincing pneumonia to explain fever. No effusion or pneumothorax. No acute osseous findings.  IMPRESSION: Negative for pneumonia.  Donnelly Angelica, Med Student 05/06/2014, 9:23 AM FPTS Intern pager: [phone], text pages welcome Upper Level Addendum:  I have seen and evaluated this patient along with MS Mr. Grandville Silos and reviewed the above note, making necessary revisions in [facility].   Clearance Coots, MD Family Medicine PGY-2

## 2014-05-06 NOTE — Progress Notes (Signed)
SLP Cancellation Note  Patient Details Name: [name] MRN: [id number] DOB: [birth year]   Cancelled treatment:       Reason Eval/Treat Not Completed: Patient at procedure or test/unavailable (transport present to take pt for EEG). Will return as able for swallow evaluation - note that patient has PEG.    Germain Osgood, M.A. CCC-SLP [phone]  Germain Osgood 05/06/2014, 2:39 PM

## 2014-05-06 NOTE — Progress Notes (Addendum)
Rectal temp of 103.2, BP 88/58, HR 113. Dr. Sherril Cong notified. New orders received. Will continue to monitor.   BP 74/50 after 1L bolus, HR 86, rectal temp of 101.3. Dr. Sherril Cong updated. New orders received. Will continue to monitor.   Lum Babe, RN

## 2014-05-06 NOTE — Consult Note (Signed)
Neurology Consultation Reason for Consult: seizures Referring Physician: Ok Anis  CC: Seizure  History is obtained from: daughter  HPI: [name] is a 64 y.o. male with three episodes concerning for seizures. The first episode happened while sitting he fell over and stiffened and had bilateral shaking lasting about a minute. He is confused and does not answer questions for a few minutes, then takes several more minutes before he starts being less confused.   He also collapsed at the bank with stiffening and LOC a month ago.   He was feeling well prior to coming in.    ROS: A 14 point ROS was performed and is negative except as noted in the HPI.   Past Medical History  Diagnosis Date  . Hypertension   . Cirrhosis of liver   . Hypothyroidism   . Alcohol abuse   . CHF (congestive heart failure)     EF 45-50% 05/2012  . Hyperlipidemia   . Arthritis   . Laryngeal cancer 2011    Laryngectomy, T3N0M0    Family History: No hx sz  Social History: Tob: denies  Exam: Current vital signs: BP 136/91 mmHg  Pulse 88  Temp(Src) 99.4 F (37.4 C) (Oral)  Resp 24  Ht 5\' 11"  (1.803 m)  Wt 48.399 kg (106 lb 11.2 oz)  BMI 14.89 kg/m2  SpO2 97% Vital signs in last 24 hours: Temp:  [98.2 F (36.8 C)-103.2 F (39.6 C)] 99.4 F (37.4 C) (03/02 1700) Pulse Rate:  [70-113] 88 (03/02 1600) Resp:  [17-32] 24 (03/02 1600) BP: (67-136)/(44-94) 136/91 mmHg (03/02 1600) SpO2:  [90 %-99 %] 97 % (03/02 1600) Weight:  [48.399 kg (106 lb 11.2 oz)] 48.399 kg (106 lb 11.2 oz) (03/02 0325)   Physical Exam  Constitutional: Appears well-developed and well-nourished.  Psych: Affect appropriate to situation Eyes: No scleral injection HENT: previous surgery. Appears to have some exudate.  Head: Normocephalic.  Cardiovascular: Normal rate and regular rhythm.  Respiratory: Effort normal and breath sounds normal to anterior ascultation GI: Soft.  No distension. There is no tenderness.   Skin: WDI  Neuro: Mental Status: Patient is awake, alert, interactive and appropriate No signs of aphasia or neglect Cranial Nerves: II: Visual Fields are full. Pupils are equal, round, and reactive to light.   III,IV, VI: EOMI without ptosis or diploplia.  V: Facial sensation is symmetric to temperature VII: Facial movement is symmetric.  VIII: hearing is intact to voice X: Uvula elevates symmetrically XI: Shoulder shrug is symmetric. XII: tongue is midline without atrophy or fasciculations.  Motor: Tone is normal. Bulk is normal. 5/5 strength was present in all four extremities.  Sensory: Sensation is symmetric to light touch and temperature in the arms and legs. Deep Tendon Reflexes: 1+ and symmetric in the biceps and patellae.  Cerebellar: FNF intact bilaterally         I have reviewed labs in epic and the results pertinent to this consultation are: Elevated lactate on admission.    I have reviewed the images obtained:CT head  - 2/3  Impression: 64 yo M with recurrent episodes of stiffening, what sounds like tonic activity, and posti-ictal pahse. He has now had a total of four events. He was sitting for one, standing for three. Though convulsive syncope is possible, the post-ictal phase, duration of shaking, and description I feel favor an epileptic nature. I agree with MRI brain, but would go ahead and start AED therapy.   If events continue despite therapy, may  need to further pin down the diagnosis.   Recommendations: 1) Keppra 500mg  BID 2) MRI brain 3) will continue to follow.    Roland Rack, MD [facility] [phone]  If 7pm- 7am, please page neurology on call as listed in [place].

## 2014-05-06 NOTE — Progress Notes (Signed)
Pt not available for 1st round stoma check. RT will check at later time.

## 2014-05-06 NOTE — Progress Notes (Signed)
UR COMPLETED  

## 2014-05-06 NOTE — Procedures (Signed)
ELECTROENCEPHALOGRAM REPORT  Patient: [name]       Room #: 4B63 EEG No. ID: [id number] Age: 64 y.o.        Sex: male Referring Physician: Ok Anis Report Date:  05/06/2014        Interpreting Physician: Anthony Sar  History: BREYLEN AGYEMAN is an 64 y.o. male history of hypertension, hyperlipidemia, alcohol abuse and liver cirrhosis as well as congestive heart failure who presented following an episode of seizure-like activity with associated loss of bowel and bladder control, and followed by confusion.  Indications for study:  Rule out seizure disorder  Technique: This is an 18 channel routine scalp EEG performed at the bedside with bipolar and monopolar montages arranged in accordance to the international 10/20 system of electrode placement.   Description: This EEG recording was performed during wakefulness. Background activity consisted of 9 Hz symmetrical alpha rhythm which attenuated fairly well with eye-opening. Photic stimulation produced symmetrical occipital driving response. Hyperventilation was not performed. No epileptiform discharges were recorded.  Interpretation: This is a normal EEG recording during wakefulness. No evidence of an epileptic disorder was demonstrated. However, a normal EEG recording in and of itself does not rule out seizure disorder.   Rush Farmer M.D. [facility] [phone]

## 2014-05-06 NOTE — Progress Notes (Signed)
EEG Completed; Results Pending  

## 2014-05-06 NOTE — Consult Note (Signed)
Patient [name]      DOB: 19-Jun-1950      NUU:[id number]     Consult Note from the Palliative Medicine Team at [place] Requested by: Dr Dianah Field     PCP: Howard Pouch, DO Reason for Consultation:Odynophagia    Phone Number:[phone]  Assessment/Recommendations: 64 yo male with PMHx of laryngeal CA s/p resection in 2011 (per [facility] ENT note), HTN, CHF, etoh abuse. Presented with LOC and weight loss w/odynophagia  1. Code Status: Full, did not discuss  2. GOC: See previous consultation note. He has seen XRT/Oncology and ENT at [facility]. Recent PEG placement.  Plans to undergo chemo/XRT. Tumor board to discuss surgical intervention though it sounds like that is thought to be an unlikely option and concern would be he would not be able to swallow again.  Gave him information for [facility] outpatient palliative care clinic through the cancer center there, but they have not followed up with them yet. As Trigger recovers from acute issues, hopefully we can talk further about hopes/expectations related to his cancer treatment.  I will follow-up with them tomorrow as well.    Appreciate being re-consulted on Mr Bowlby who is familiar to me.  I would request that continue in case of future admissions.   3. Symptom Management:  1. Odynophagia: Now s/p PEG. Recently started on Fentanyl 63mcg/hr patch at [facility] in addition to oxycodone 5mg  PRN. Currently does not have patch on. Reportedly refused yesterday.  Pain controlled. Will have to monitor closely as patch still circulating in his system and likely to wear off in next 24hrs.  I do not suspect this was playing major role in hypotension. With fevers, hypotension, infection seems more likely at this point.  Agree with MMW w/lidocaine and PRN oxycodone. Would be reasonable to restart patch if pain returns.  If ongoing concerns that opioids causing hypotension, could use 12.58mcg patch rather than 32mcg.   2. Constipation/Diarrhea- dghtr reported that he went 5 days at home without BM prior to MOM.  This may have been setup by addition of fentanyl patch at [facility]. I would recommend adding senna syrup BID to prevent opioid induced constipation once his diarrhea resolves.    4. Psychosocial/Spiritual: 4 children and currently lives at home with his daughter [name] in [place]. Originally from [place]. [name] seems to be his main support system and has been attending appointments for his care.    Brief HPI: 64 yo male with PMHx of laryngeal CA s/p laryngectomy, CHF, etoh abuse who is known to me from prior admission. Since that time, he was admitted to [facility] for dehydration/hypotension requiring PEG tube placement.  This occurred between 2/10-2/14. He has been seen as outpatient by oncology and rad/onc with plans for further chemo/XRT. It does not sound like surgery was going to be entertained though discussion was to occur at [facility] tumor board.  In interim, he was started on fentanyl patch at 58mcg/hr which has helped his pain significantly. According to his daughter he had several very good days. Developed around 5 days of constipation requiring milk of mag which caused several days of diarrhea.  He has home health RN who noted hypotension the other day and reportedly a SBP in the 60's.  According to patient and daughter, tube feeds have been going well without issue.  He has not had somnolence related to fentanyl patches.  In past 24hrs though, he has been becoming more drowsy, ? Of convulsive episodes and near  syncope.  Bowel/bladder incontinence but not associated with lower ext weakness, back pain, paraesthesias. No pain or unusual drainage from PEG. Admitted for hypotension.  Fevers and persistent hypotension overnight lead to addition of vanc/cefepime.  He reports that he feels tired today. No pain.  No other acute complaints.    ROS: Full ROS negative unless otherwise mentioned above.      PMH:  Past Medical History  Diagnosis Date  . Hypertension   . Cirrhosis of liver   . Hypothyroidism   . Alcohol abuse   . CHF (congestive heart failure)     EF 45-50% 05/2012  . Hyperlipidemia   . Arthritis   . Laryngeal cancer 2011    Laryngectomy, T3N0M0     PSH: Past Surgical History  Procedure Laterality Date  . Tracheal surgery    . Tracheostomy    . Left and right heart catheterization with coronary angiogram N/A 12/28/2011    Procedure: LEFT AND RIGHT HEART CATHETERIZATION WITH CORONARY ANGIOGRAM;  Surgeon: Jolaine Artist, MD;  Location: [facility] CATH LAB;  Service: Cardiovascular;  Laterality: N/A;  . Esophagogastroduodenoscopy N/A 04/10/2014    Procedure: ESOPHAGOGASTRODUODENOSCOPY (EGD);  Surgeon: Missy Sabins, MD;  Location: [facility] ENDOSCOPY;  Service: Endoscopy;  Laterality: N/A;   I have reviewed the [place] and SH and  If appropriate update it with new information. Allergies  Allergen Reactions  . Vicodin [Hydrocodone-Acetaminophen] Rash  . Other     Unknown anesthesia medicine caused cardiac arrest.  . Tylenol [Acetaminophen]     HBP -Liver Sorosis   Scheduled Meds: . antiseptic oral rinse  7 mL Mouth Rinse BID  . ceFEPime (MAXIPIME) IV  1 g Intravenous Q12H  . chlorhexidine  15 mL Mouth Rinse BID  . fentaNYL  25 mcg Transdermal Q72H  . heparin  5,000 Units Subcutaneous 3 times per day  . levothyroxine  100 mcg Intravenous QAC breakfast  . magic mouthwash w/lidocaine  5 mL Oral QID  . megestrol  400 mg Oral BID  . pantoprazole (PROTONIX) IV  40 mg Intravenous QHS  . sodium chloride  3 mL Intravenous Q12H  . vancomycin  1,000 mg Intravenous Q24H   Continuous Infusions: . sodium chloride 75 mL/hr at 05/06/14 0800  . feeding supplement (JEVITY 1.2 CAL) 1,000 mL (05/06/14 0030)   PRN Meds:.diclofenac sodium, guaiFENesin, ibuprofen, ondansetron (ZOFRAN) IV, oxyCODONE    BP 93/62 mmHg  Pulse 71  Temp(Src) 99.4 F (37.4 C) (Oral)  Resp 25  Ht 5\' 11"   (1.803 m)  Wt 48.399 kg (106 lb 11.2 oz)  BMI 14.89 kg/m2  SpO2 96%   PPS: 70   Intake/Output Summary (Last 24 hours) at 05/06/14 0848 Last data filed at 05/06/14 0800  Gross per 24 hour  Intake   4380 ml  Output   1551 ml  Net   2829 ml    Physical Exam:  General: Alert, NAD HEENT:  [place], sclera anicteric Neck: supple, post-surgical changes Chest:   CTAB, symm expan CVS: RRR Abdomen: soft, NT, +BS, +PEG with clean dressing in place Ext: no edema Neuro: MS +5/5 and symmetrical in lower ext. No sensory deficits of lower ext.  Skin: warm/dry  Labs: CBC    Component Value Date/Time   WBC 5.3 05/06/2014 0327   WBC 4.3 11/04/2009 1559   RBC 3.24* 05/06/2014 0327   RBC 4.17* 11/04/2009 1559   HGB 8.9* 05/06/2014 0327   HGB 12.4* 11/04/2009 1559   HCT 26.6* 05/06/2014 0327   HCT  38.0* 11/04/2009 1559   PLT 174 05/06/2014 0327   PLT 169 11/04/2009 1559   MCV 82.1 05/06/2014 0327   MCV 91.2 11/04/2009 1559   MCH 27.5 05/06/2014 0327   MCHC 33.5 05/06/2014 0327   MCHC 32.6 11/04/2009 1559   RDW 17.7* 05/06/2014 0327   RDW 17.2* 11/04/2009 1559   LYMPHSABS 2.3 05/04/2014 1513   LYMPHSABS 2.1 11/04/2009 1559   MONOABS 1.9* 05/04/2014 1513   MONOABS 0.4 11/04/2009 1559   EOSABS 0.1 05/04/2014 1513   EOSABS 0.1 11/04/2009 1559   BASOSABS 0.0 05/04/2014 1513   BASOSABS 0.0 11/04/2009 1559    BMET    Component Value Date/Time   NA 133* 05/06/2014 0327   K 4.1 05/06/2014 0327   CL 109 05/06/2014 0327   CO2 18* 05/06/2014 0327   GLUCOSE 99 05/06/2014 0327   BUN 24* 05/06/2014 0327   CREATININE 1.29 05/06/2014 0327   CREATININE 0.70 06/10/2013 1043   CALCIUM 7.3* 05/06/2014 0327   GFRNONAA 58* 05/06/2014 0327   GFRAA 68* 05/06/2014 0327    CMP     Component Value Date/Time   NA 133* 05/06/2014 0327   K 4.1 05/06/2014 0327   CL 109 05/06/2014 0327   CO2 18* 05/06/2014 0327   GLUCOSE 99 05/06/2014 0327   BUN 24* 05/06/2014 0327   CREATININE 1.29 05/06/2014  0327   CREATININE 0.70 06/10/2013 1043   CALCIUM 7.3* 05/06/2014 0327   PROT 8.0 05/04/2014 1513   ALBUMIN 3.5 05/04/2014 1513   AST 31 05/04/2014 1513   ALT 37 05/04/2014 1513   ALKPHOS 101 05/04/2014 1513   BILITOT 0.9 05/04/2014 1513   GFRNONAA 58* 05/06/2014 0327   GFRAA 68* 05/06/2014 0327    3/2 CXR IMPRESSION: Negative for pneumonia.    Total Time: 60 minutes Greater than 50%  of this time was spent counseling and coordinating care related to the above assessment and plan. Extensively reviewed outside records from [facility].  Doran Clay D.O. Palliative Medicine Team at [facility]  Pager: [phone] Team Phone: [phone]

## 2014-05-07 LAB — CBC WITH DIFFERENTIAL/PLATELET
BASOS PCT: 0 % (ref 0–1)
Basophils Absolute: 0 10*3/uL (ref 0.0–0.1)
EOS ABS: 0.2 10*3/uL (ref 0.0–0.7)
EOS PCT: 2 % (ref 0–5)
HEMATOCRIT: 25.6 % — AB (ref 39.0–52.0)
Hemoglobin: 8.8 g/dL — ABNORMAL LOW (ref 13.0–17.0)
Lymphocytes Relative: 15 % (ref 12–46)
Lymphs Abs: 1.4 10*3/uL (ref 0.7–4.0)
MCH: 26.9 pg (ref 26.0–34.0)
MCHC: 34.4 g/dL (ref 30.0–36.0)
MCV: 78.3 fL (ref 78.0–100.0)
MONO ABS: 0.7 10*3/uL (ref 0.1–1.0)
Monocytes Relative: 7 % (ref 3–12)
NEUTROS ABS: 7 10*3/uL (ref 1.7–7.7)
Neutrophils Relative %: 76 % (ref 43–77)
Platelets: 195 10*3/uL (ref 150–400)
RBC: 3.27 MIL/uL — ABNORMAL LOW (ref 4.22–5.81)
RDW: 17.4 % — ABNORMAL HIGH (ref 11.5–15.5)
WBC: 9.2 10*3/uL (ref 4.0–10.5)

## 2014-05-07 LAB — GLUCOSE, CAPILLARY
GLUCOSE-CAPILLARY: 100 mg/dL — AB (ref 70–99)
GLUCOSE-CAPILLARY: 84 mg/dL (ref 70–99)
Glucose-Capillary: 96 mg/dL (ref 70–99)
Glucose-Capillary: 99 mg/dL (ref 70–99)

## 2014-05-07 LAB — COMPREHENSIVE METABOLIC PANEL
ALK PHOS: 127 U/L — AB (ref 39–117)
ALT: 43 U/L (ref 0–53)
ANION GAP: 8 (ref 5–15)
AST: 30 U/L (ref 0–37)
Albumin: 2.3 g/dL — ABNORMAL LOW (ref 3.5–5.2)
BUN: 12 mg/dL (ref 6–23)
CHLORIDE: 106 mmol/L (ref 96–112)
CO2: 19 mmol/L (ref 19–32)
Calcium: 8.3 mg/dL — ABNORMAL LOW (ref 8.4–10.5)
Creatinine, Ser: 0.9 mg/dL (ref 0.50–1.35)
GFR calc non Af Amer: >90 mL/min (ref >90)
GLUCOSE: 125 mg/dL — AB (ref 70–99)
Potassium: 3.6 mmol/L (ref 3.5–5.1)
Sodium: 133 mmol/L — ABNORMAL LOW (ref 135–145)
TOTAL PROTEIN: 6.5 g/dL (ref 6.0–8.3)
Total Bilirubin: 1.3 mg/dL — ABNORMAL HIGH (ref 0.3–1.2)

## 2014-05-07 LAB — MAGNESIUM: MAGNESIUM: 2 mg/dL (ref 1.5–2.5)

## 2014-05-07 LAB — PHOSPHORUS: Phosphorus: 3.6 mg/dL (ref 2.3–4.6)

## 2014-05-07 MED ORDER — JEVITY 1.2 CAL PO LIQD
1000.0000 mL | ORAL | Status: DC
  Administered 2014-05-07: 1000 mL
  Filled 2014-05-07 (×2): qty 1000

## 2014-05-07 MED ORDER — FREE WATER: 175.0000 mL | Freq: Four times a day (QID) | Status: DC

## 2014-05-07 MED ORDER — MAGIC MOUTHWASH W/LIDOCAINE: 5.0000 mL | Freq: Four times a day (QID) | ORAL | Status: DC

## 2014-05-07 MED ORDER — FENTANYL 25 MCG/HR TD PT72
25.0000 ug | MEDICATED_PATCH | TRANSDERMAL | Status: DC
  Administered 2014-05-07: 25 ug via TRANSDERMAL
  Filled 2014-05-07: qty 1

## 2014-05-07 MED ORDER — JEVITY 1.2 CAL PO LIQD: 65.0000 mL/h | ORAL | Status: DC

## 2014-05-07 MED ORDER — OXYCODONE HCL 5 MG/5ML PO SOLN: 5.0000 mg | ORAL | Status: DC | PRN

## 2014-05-07 MED ORDER — LEVETIRACETAM 100 MG/ML PO SOLN: 500.0000 mg | Freq: Two times a day (BID) | ORAL | Status: DC

## 2014-05-07 MED ORDER — LEVOTHYROXINE SODIUM 200 MCG PO TABS
200.0000 ug | ORAL_TABLET | Freq: Every day | ORAL | Status: DC
  Administered 2014-05-07: 200 ug
  Filled 2014-05-07 (×2): qty 1

## 2014-05-07 MED ORDER — FENTANYL 25 MCG/HR TD PT72: 25.0000 ug | MEDICATED_PATCH | TRANSDERMAL | Status: DC

## 2014-05-07 NOTE — Progress Notes (Signed)
Family Medicine Teaching Service Daily Progress Note Intern Pager: [phone]  Patient name: [name] Medical record number: [id number] Date of birth: [birth year] Age: 64 y.o. Gender: male  Primary Care Provider: Howard Pouch, DO Consultants: Palliative Care, Nutrition Code Status: Full (confirmed this admission)  Pt Overview and Major Events to Date:  09/2009: Radical Neck Dissection for supraglottic [facility] 2/10-14, 2016: New primary SCC of hypopharynx, g-tube placed 2/29: Admitted with symptomatic hypotension from home health  Assessment and Plan: [name] is a 64 y.o. male presenting with Low blood pressures and dizziness at home and AKI found in the ED. 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 He looks well on exam and is alert and oriented. Currently 0/4 SIRS criteria, tmax overnight 3/2 99.4, P92, max RR 28, WBC 5.3 Vanc and zosyn started overnight 3/1. - Trending vitals > BP improved with fluid resuscitation.  - blood cultures: NGx1day - Vancomycin and Cefepime started 3/2 early am, stopped early this am - Repeat U/A 3/2 without signs of infections - Repeat Lactate 1.6 - f/u blood cultures and d/c home off antibiotics if negative  # Dehydration / AKI - Cr improved to 1.18, creatinine was 1.49 on admission. Blood pressures improving. BPs stable overnight - d/c IVF - Trend BMET - Consult to Nutrition:currently at 67mL/hr Jevity - Should need an additional liter of water daily, will discuss this with nutrition  # SCC of Larynx / Pharynx - Followed by [facility] ENT. Reached out to ENT Dr.  Nicolette Bang for recs, who had signed off. Talked to [place], who agreed that the patient's current functional status was worrisome, but will evaluate in office next week. - MRI Brain 3/2 negative for metastatic disease - f/U with daughter regarding further follow up and plan regarding rad / onc as well as with onc here in [place] consulted, recommend: MMW with Lidocaine, PRN oxycodone 5mg , senna syrup BID  - Does not take pills po > SLP again this admission and converted what meds we could to solution via tube or crush and admin via tube.  # Neurologic / Autonomic Symptoms: [name] daughter reports seizure-like symptoms, including arm twitching/shaking/jerking, falling to the floor, incontinence, and inability to walk without assist starting within the last several days. Combined with hypotension unresponsive to fluid resuscitation, there is a possibility that this constellation of symptoms is due to metastatic disease in the brain. MRI brain negative for mets, showed sphenoid meningioma but this does not explain his symptoms. Neurology was consulted and recommended starting Keppra after EEG did not show seizure activity It is also possible that these symptoms are due to infection, which is being treated as above.  - MRI brain 3/2 negative for metastatic disease, but showed sphenoid meningioma. - EEG 3/2 negative for seizure activity - Neuro consulted, recs: Keppra 778 BID  #Systolic CHF: echo 04/10/2351 shows EF 50-55%, focal basal hypertrophy of septum, and grade 1 LV diastolic dysfunction (previously moderate LVH, EF 45-50%, w/ grade 1 diastolic dysfunction in 6144) - Monitoring fluid status closely  - Strict I/O's.   # HTN - hypotensive here. On Norvasc 5mg  daily at  home. As well as zebeta and hydralazine, lisinopril by history.  - Holding antihypertensives currently.  - Will likely need to restart beta blocker soon to avoid reflex tachycardia. At that  time, can crush and admin per tube.  # Severe Protein - Calorie Malnutrition - Noted on previous admission. Albumin of 2.7 in the past, Albumin 3.5 here. Has Creon supplement and megace. G-tube in place. Due to history of questionable nutrition through g-tube, the patient is at a risk of refeeding syndrome. Checking magnesium and phosphorous 3/1 and continue to monitor. - Continue meal supplements here.  - Mg, PO4 wnl - nutrition consultation, ordered tube feeding protocol. - tube feeds initiated, currently at goal of 60 mL/hrof Jevity 1.2  - air bed for patient with sacral erythema  - barrier cream as needed   #Cirrhosis of liver: LFTs WNL, liver not palpably enlarged. Platelets preserved. Albumin 3.5.   #ETOH dependence: History of, hasn't had EtOH since early Feb. - Thiamine, Multivitamin.  - MCV 80.   #Chronic pancreatitis: on Creon at home. Lipase 18 on 2/3. No abdominal pain at this time. G-tube in place.  -Continue home medications  #Hypothyroidism: TSH <0.01 at last admission. Decreased 2/8 to 213mcg PO daily. - Synthroid 250 mcg PO.  - Will need further outpatient hypothyroidism follow up and mgmt.   #Hyperlipidemia - Holding home aspirin and lipitor   FEN/GI: nutrition consult, can use G-tube. SLP for po. 1/2 NS at 125cc/hr>>100cc/hr>>75cc/hr. Continue PPI. Prophylaxis: Sub q heparin, air bed  Disposition: Patient close to baseline, will discharge if stable this afternoon.  Subjective:  Patient with no acute events overnight. No episodes of hypotension or seizure activity noted. Patient complains of sore throat, and was made aware of his PRN oxycodone 5mg . He also requests a swallow study, which did not happen yesterday as the team came by when he was in EEG. Neuro saw the patient yesterday and recommended Keppra 500 BID and MRI brain.   Objective: Temp:  [97.7 F (36.5 C)-99.4 F (37.4 C)] 98.7 F (37.1 C) (03/03 0404) Pulse Rate:  [71-92] 92 (03/03  0404) Resp:  [16-28] 20 (03/03 0404) BP: (93-136)/(62-94) 122/90 mmHg (03/03 0404) SpO2:  [96 %-100 %] 100 % (03/03 0404) Weight:  [48.5 kg (106 lb 14.8 oz)] 48.5 kg (106 lb 14.8 oz) (03/03 0402) Physical Exam: General: NAD, A&Ox3, Resting comfortably in bed. Cachectic  HEENT: NCAT, O/P clear, MMM; stoma well-healed and hypopigmented Cardiovascular: RRR, No MRG, Normal S1/S2, 2+ distal pulses  Respiratory: CTA Bilaterally, No increased WOB Abdomen: S, NT, ND, +BS, G-tube in place. Site C/D/I. No erythema or tenderness. No organomegaly.  Extremities: WWP, 2+ distal pulses, emaciated. No calf swelling or tenderness or asymmetry. Skin: No rashes, no lesions, no skin tenting, cap refill <3 seconds Neuro: No focal deficits. Pt does not speak but rather mouths words, participates in conversation appropriately. 5/5 strength in LE b/l, Normal plantar- and dorsiflexion b/l, Normal sensation in LE b/l, no nuchal rigidity, no headache or vision changes.   Laboratory:  Recent Labs Lab 05/05/14 0517 05/06/14 0327 05/07/14 0230  WBC 9.3 5.3 9.2  HGB 9.9* 8.9* 8.8*  HCT 29.7* 26.6* 25.6*  PLT 211 174 195    Recent Labs Lab 05/04/14 1513  05/05/14 0517 05/06/14 0327 05/07/14 0230  NA 135  --  137 133* 133*  K 4.9  --  5.3* 4.1 3.6  CL 98  --  104 109 106  CO2 27  --  21 18* 19  BUN 51*  --  32* 24* 12  CREATININE 1.49*  < > 1.14 1.29 0.90  CALCIUM 9.7  --  8.7 7.3* 8.3*  PROT 8.0  --   --   --  6.5  BILITOT 0.9  --   --   --  1.3*  ALKPHOS 101  --   --   --  127*  ALT 37  --   --   --  43  AST 31  --   --   --  30  GLUCOSE 105*  --  89 99 125*  < > = values in this interval not displayed.  UA 3/2 no Leuokcytes  Imaging/Diagnostic Tests: MRI Brain 3/3   IMPRESSION: No acute intracranial process, specifically no MR findings of metastatic disease.  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  Mild to moderate global parenchymal brain volume loss. Mild to moderate white  matter changes can be seen with chronic small vessel ischemic disease.   Donnelly Angelica, Med Student 05/07/2014, 7:26 AM FPTS Intern pager: [phone], text pages welcome  I agree with the medical student note above and have edited it as necessary. I formulated the plan and performed my own physical exam which are documented above.  Beverlyn Roux, MD, MPH [place] [facility] PGY-2 05/07/2014 9:49 AM

## 2014-05-07 NOTE — Progress Notes (Signed)
Patient has had 2 IVs infiltrate overnight. IV team unable to place another access point. They are recommending PICC line if needing long term access. Dr. Tamala Julian with [facility] teaching service paged and informed. MD will follow up with pharmacy to allow for meds to be given through G tube.

## 2014-05-07 NOTE — Care Management Note (Signed)
    Page 1 of 2   05/07/2014     5:06:53 PM CARE MANAGEMENT NOTE 05/07/2014  Patient:  [name], [name]   Account Number:  [id number]  Date Initiated:  05/07/2014  Documentation initiated by:  Marvetta Gibbons  Subjective/Objective Assessment:   Pt admitted with AKI/dehydration     Action/Plan:   PTA pt lived at home with daughter- active with [facility] for [place] and bolus TF   Anticipated DC Date:  05/07/2014   Anticipated DC Plan:  [place]  CM consult      [facility] Choice  Resumption Of Svcs/PTA Provider   Choice offered to / List presented to:  C-4 Adult Children   DME arranged  TUBE FEEDING  TUBE FEEDING PUMP      DME agency  [place] arranged  HH-1 RN  [place].   Status of service:  Completed, signed off Medicare Important Message given?   (If response is "NO", the following Medicare IM given date fields will be blank) Date Medicare IM given:   Medicare IM given by:   Date Additional Medicare IM given:   Additional Medicare IM given by:    Discharge Disposition:  [place]  Per UR Regulation:  Reviewed for med. necessity/level of care/duration of stay  If discussed at [place] of Stay Meetings, dates discussed:    Comments:  05/07/14- 1645- Marvetta Gibbons RN, BSN [phone] Received call from MD that pt was discharging home tonight with continuous TF- orders placed and resumption order for HH-RN- call made to [facility] with [facility] for resumption of care- HH-RN will also have SW to f/u at home- also made call to [facility] with [facility] for TF needs- per [facility] with [facility] - will not be able to get cont. TF out to pt tonight due to late hour of referral- spoke with MD who is ok with sending pt home with bolus feeds and free water overnight and starting TF as soon as [facility] can get them out to pt in am- spoke with pt and daughter at bedside who are agreeable  to plan of bolus feeds (daughter states she has supplies at home for bolus feeds) and daughter states she understands about also giving the free water- [facility] to bring cont. TF and supplies in am- bedside RN will also go over free water schedule with daughter on d/c instructions.

## 2014-05-07 NOTE — Progress Notes (Signed)
Subjective: No further events  Exam: Filed Vitals:   05/07/14 0828  BP: 120/87  Pulse: 92  Temp:   Resp: 22   Gen: In bed, NAD MS: awake, alert, interactive and appropriate.  KK:XFGHW, EOMI Motor: MAEW Sensory:intact to LT    Impression: 64 yo M with convulsions concerning for seizure, though convulsive syncope I still feel is a lesser consideration. I have reviewed MRI, there is a small meningioma which may or may not be contributing to these episodes if they are seizures.  I would favor following his meningioma with imaging given it's location and uncertainty regarding whether it is symptomatic.   Recommendations: 1) Keppra 500mg  BID 2) follow up as outpatient with neurology. No further recommendations as an inpatient, please call with any further questions or concerns.   Roland Rack, MD [facility] [phone]  If 7pm- 7am, please page neurology on call as listed in [place].

## 2014-05-07 NOTE — Progress Notes (Signed)
Patient [name]      DOB: [birth year]      QBV:[id number]   Palliative Medicine Team at [facility] Progress Note    Subjective: Feeling a little better today. Throat pain bothering him more.  No diarrhea today.  No other acute complaints.     Filed Vitals:   05/07/14 0828  BP: 120/87  Pulse: 92  Temp:   Resp: 22   Physical exam: GEN: alert, NAD CV: RRR LUNGS: CTAB ABD: soft, +PEG Ext: warm, no edema   CBC    Component Value Date/Time   WBC 9.2 05/07/2014 0230   WBC 4.3 11/04/2009 1559   RBC 3.27* 05/07/2014 0230   RBC 4.17* 11/04/2009 1559   HGB 8.8* 05/07/2014 0230   HGB 12.4* 11/04/2009 1559   HCT 25.6* 05/07/2014 0230   HCT 38.0* 11/04/2009 1559   PLT 195 05/07/2014 0230   PLT 169 11/04/2009 1559   MCV 78.3 05/07/2014 0230   MCV 91.2 11/04/2009 1559   MCH 26.9 05/07/2014 0230   MCHC 34.4 05/07/2014 0230   MCHC 32.6 11/04/2009 1559   RDW 17.4* 05/07/2014 0230   RDW 17.2* 11/04/2009 1559   LYMPHSABS 1.4 05/07/2014 0230   LYMPHSABS 2.1 11/04/2009 1559   MONOABS 0.7 05/07/2014 0230   MONOABS 0.4 11/04/2009 1559   EOSABS 0.2 05/07/2014 0230   EOSABS 0.1 11/04/2009 1559   BASOSABS 0.0 05/07/2014 0230   BASOSABS 0.0 11/04/2009 1559    CMP     Component Value Date/Time   NA 133* 05/07/2014 0230   K 3.6 05/07/2014 0230   CL 106 05/07/2014 0230   CO2 19 05/07/2014 0230   GLUCOSE 125* 05/07/2014 0230   BUN 12 05/07/2014 0230   CREATININE 0.90 05/07/2014 0230   CREATININE 0.70 06/10/2013 1043   CALCIUM 8.3* 05/07/2014 0230   PROT 6.5 05/07/2014 0230   ALBUMIN 2.3* 05/07/2014 0230   AST 30 05/07/2014 0230   ALT 43 05/07/2014 0230   ALKPHOS 127* 05/07/2014 0230   BILITOT 1.3* 05/07/2014 0230   GFRNONAA >90 05/07/2014 0230   GFRAA >90 05/07/2014 0230      Assessment and plan: 64 yo male with PMHx of laryngeal CA s/p resection in 2011 (per [facility] ENT note), HTN, CHF, etoh abuse. Presented with LOC and weight loss w/odynophagia  1. Code  Status: Full, did not discuss  2. GOC: See note from 3/2.  Will need fu with [facility]. I again encouraged him to make appointment with palliative care clinic in the cancer center there. It sounds like he has been referred to pain clinic as well. My bias would be for palliative care clinic to manage pain as they will also help support him through his cancer treatment and address goals as they may evolve through this course.   3. Symptom Management:  1. Odynophagia: Pain increasing today and I suspect this is because his patch his finally wearing off. He is agreeable to restarting, and I have modified order to reflect this.  Can continue his home PRN oxycodone and MMW w/lido as you are.  2. Constipation/Diarrhea- reports no BM in past 24h.  Senna syrup started. This can be given through PEG if needed as well.    4. Psychosocial/Spiritual: 4 children and currently lives at home with his daughter [name] in [place]. Originally from [place]. [name] seems to be his main support system and has been attending appointments for his care.   Doran Clay D.O. Palliative Medicine Team at [facility]  Pager: [phone] Team Phone: [phone]

## 2014-05-07 NOTE — Progress Notes (Signed)
Daughter and pt educated about importance of free water flushes with continuous g-tube feedings. Reinforced and educated that the flushes should be 140ml's four times per day. Home health is scheduled to come to pt's house tomorrow(05/08/14). In the meantime, MD approved bolus feeds with free water flushes until patient and caregiver receive proper equipment tomorrow (05/08/14) for continuous g-tube feeds. Will continue to monitor pt and reinforce nutritional education.

## 2014-05-07 NOTE — Progress Notes (Signed)
Spoke to pt's daughter on the phone. Daughter stated her main concern was his seizure like behavior prior to arrival of [facility] ED. Suction and o2 set up at bedside.  MD notified. Will continue to monitor.

## 2014-05-07 NOTE — Discharge Instructions (Signed)
Our nutritionists have recommended that your tube feeds be changed to the following:  Jevity 1.2 at a continuous rate of 31mL/hour 145mL flushes of water, 4 times per day  Our speech-language pathologist have recommended your oral diet:  Dysphagia 3 (Mechanical Soft);Thin liquid   Liquid Administration via: Cup;Straw Medication Administration: Whole meds with liquid Supervision: Patient able to self feed Compensations: Slow rate;Small sips/bites Postural Changes and/or Swallow Maneuvers: Seated upright 90 degrees   For pain control, change Fentanyl patch every 4 days. You may use oxycodone 5mg  every four hours as needed for breakthrough pain. To give this medication, draw 3mL of liquid into syringe and insert into g-tube.  Please follow up with your ENT and oncology doctors at [facility] next week.

## 2014-05-07 NOTE — Progress Notes (Signed)
NUTRITION FOLLOW UP  DOCUMENTATION CODES Per approved criteria  -Severe malnutrition in the context of chronic illness -Underweight   Pt meets criteria for severe malnutrition in the context of chronic illness as evidenced by <75% energy intake for >1 month, and severe subcutaneous body fat and muscle mass depletion.      Intervention:    Continue Jevity 1.2 increase to 65 ml/h via G-tube to provide 1872 kcals, 87 gm protein, 1264 ml free water daily  Recommend free water flushes 175 ml QID.  Nutrition Dx:   Inadequate oral intake related to inability to eat as evidenced by NPO status, ongoing.  Goal:   Intake to meet >90% of estimated nutrition needs. Met.  Monitor:   TF tolerance/adequacy, weight trend, labs.  Assessment:   64 y/o male presenting with low blood pressure and dizziness at home. AKI was found in the ED. 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   Patient is being discharged home today. Received verbal consult for free water recommendations. Weight trending down, will increase TF goal rate to increase calorie and protein intake.  Remains NPO with ongoing dysphagia. Tolerating TF well.  Height: Ht Readings from Last 1 Encounters:  05/04/14 _0  (1.803 m)    Weight Status:   Wt Readings from Last 1 Encounters:  05/07/14 106 lb 14.8 oz (48.5 kg)   05/04/14 110 lb 10.7 oz (50.2 kg)        Re-estimated needs:  Kcal: 1750-2000 Protein: 80-95 gm Fluid: >/= 1.75 L  Skin: intact  Diet Order:  NPO   Intake/Output Summary (Last 24 hours) at 05/07/14 1415 Last data filed at 05/07/14 1200  Gross per 24 hour  Intake 2492.16 ml  Output   1852 ml  Net 640.16 ml    Last BM: 3/2   Labs:   Recent  Labs Lab 05/05/14 0517 05/06/14 0327 05/07/14 0230  NA 137 133* 133*  K 5.3* 4.1 3.6  CL 104 109 106  CO2 21 18* 19  BUN 32* 24* 12  CREATININE 1.14 1.29 0.90  CALCIUM 8.7 7.3* 8.3*  MG  --  2.1 2.0  PHOS  --  3.3 3.6  GLUCOSE 89 99 125*    CBG (last 3)   Recent Labs  05/07/14 0419 05/07/14 0736 05/07/14 1154  GLUCAP 99 100* 84    Scheduled Meds: . antiseptic oral rinse  7 mL Mouth Rinse BID  . chlorhexidine  15 mL Mouth Rinse BID  . fentaNYL  25 mcg Transdermal Q72H  . heparin  5,000 Units Subcutaneous 3 times per day  . levETIRAcetam  500 mg Per Tube BID  . levothyroxine  200 mcg Per Tube QAC breakfast  . magic mouthwash w/lidocaine  5 mL Oral QID  . megestrol  400 mg Oral BID  . pantoprazole (PROTONIX) IV  40 mg Intravenous QHS  . sennosides  5 mL Oral BID    Continuous Infusions: . feeding supplement (JEVITY 1.2 CAL) 1,000 mL (05/07/14 1200)    Molli Barrows, RD, LDN, [place] Pager [phone] After Hours Pager [phone]

## 2014-05-07 NOTE — Discharge Summary (Signed)
[facility] Discharge Summary  Patient name: [name] Medical record number: [id number] Date of birth: 1950-10-14 Age: 64 y.o. Gender: male Date of Admission: 05/04/2014  Date of Discharge: 05/07/2014 Admitting Physician: Nathaniel Niece, MD  Primary Care Provider: Howard Pouch, DO Consultants: Neurology, Nutrition, Palliative Care  Indication for Hospitalization: Failure to thrive and seizure-like activity  Discharge Diagnoses/Problem List:  Patient Active Problem List   Diagnosis Date Noted  . Seizures 05/06/2014  . SIRS (systemic inflammatory response syndrome)   . Fever   . AKI (acute kidney injury)   . Throat cancer   . Esophageal mass   . Weight loss   . Odynophagia   . Palliative care encounter   . Severe malnutrition 04/10/2014  . Protein-calorie malnutrition, severe 04/10/2014  . Syncope 04/09/2014  . LOC (loss of consciousness) 04/09/2014  . Anorexia   . Pancreatitis, chronic 05/20/2013  . Adrenal insufficiency, primary, iatrogenic 05/20/2013  . Hyperlipidemia   . Osteoarthritis   . Hypotension 05/10/2013  . Dehydration 05/08/2013  . Chest pain 03/12/2013  . Loss of weight 03/12/2013  . Sore throat 02/07/2013  . Pain in joint, upper arm 11/28/2012  . Elevated serum creatinine 11/28/2012  . Chronic systolic heart failure 93/79/0240  . Alcohol dependence 01/04/2012  . Decreased appetite 01/03/2012  . Acute and chronic respiratory failure 12/26/2011  . Acute systolic HF (heart failure) 12/26/2011  . 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 Dyspnea 11/10/2011  . Allergic rhinitis 06/22/2011  . Hypothyroidism 10/16/2010  . Malignant neoplasm of larynx 01/10/2010  . PULMONARY NODULE 12/31/2007  . Essential hypertension, benign 11/08/2007   Disposition: To follow up with [facility] Rad/Onc next week for evaluation re: radiation treatment of soft palate.  Discharge Condition: Improved near  baseline  Discharge Exam:  Filed Vitals:   05/07/14 0735 05/07/14 0828 05/07/14 1155 05/07/14 1220  BP: 120/87 120/87 114/85   Pulse: 83 92 82   Temp:    97.7 F (36.5 C)  TempSrc:    Oral  Resp: 16 22 22    Height:      Weight:      SpO2: 99% 99% 99%     General: NAD, A&Ox3, Resting comfortably in bed. Cachectic  HEENT: NCAT, O/P clear, MMM; stoma well-healed and hypopigmented Cardiovascular: RRR, No MRG, Normal S1/S2, 2+ distal pulses  Respiratory: CTA Bilaterally, No increased WOB Abdomen: S, NT, ND, +BS, G-tube in place. Site C/D/I. No erythema or tenderness. No organomegaly.  Extremities: WWP, 2+ distal pulses, emaciated. No calf swelling or tenderness or asymmetry. Skin: No rashes, no lesions, no skin tenting, cap refill <3 seconds Neuro: No focal deficits. Pt does not speak but rather mouths words, participates in conversation appropriately. 5/5 strength in LE b/l, Normal plantar- and dorsiflexion b/l, Normal sensation in LE b/l, no nuchal rigidity, no headache or vision changes.   Brief Hospital Course:  Assessment and Plan: [name] is a 64 y.o. man who presented with low blood pressures, potential seizure activity, and dizziness at home and AKI found in the ED. 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 Other lab values improved,  suggesting a component of dehydration in his presentation. On his second day of admission, his BPs were unstable overnight, and an infectious or autonomic cause were suspected over dehydration. He was evaluated for each of these etiologies as below. He was continued on 1/2NS  at 75cc/hr, nutrition was consulted and his tube feeds were continued in the hospital. The family was given teaching regarding hydration and feeding at home. Specifically, nutrition recommended continue Jevity 1.2 increase to 65 ml/h via G-tube to provide 1872 kcals, 87 gm protein, 1264 ml free water daily and free water flushes 175 ml QID.  Rule out Sepsis  The patient became febrile overnight on HD2, with hypotension but no source of infection identified. He looked well on exam and was alert and oriented. He reached 3/4 SIRS criteria, tmax overnight 3/1 103.2, P113, max RR 31, WBC 5.3. Blood cultures were drawn on 3/1 during the day, before Vancomycin and Cefepime were started. His vitals were trended during this time, and blood cultures were followed. He did not have further episodes of profound hypotension during the admission. A urinalysis was collected on admission and did not show signs of a UTI, and a repeat urinalysis was collected on 3/2 which was negative for UTI as well. Overnight on 3/3, the patient had two IVs infiltrate. The decision was made to wait until his blood cultures came back before attempting another IV placement or placing a PICC for long term IV placement. On discharge, he had no growth of blood cultures x2 days and was not continued on broad spectrum antibiotics.   Neurologic / Autonomic Symptoms: The patient's daughter reports seizure-like symptoms, including arm twitching/shaking/jerking, falling to the floor, incontinence, and inability to walk without assist starting within the last several days. Combined with hypotension unresponsive to fluid resuscitation, there is a possibility that this constellation of symptoms was due to metastatic disease in the brain. An MRI brain was negative for mets, but showed an incidental sphenoid meningioma but this does not explain his symptoms. Neurology was consulted and recommended starting Keppra 500BID after an EEG did not show seizure  activity It is also possible that these symptoms are due to infection, which was treated as above.  SCC of Larynx / Pharynx  The patient has an irradiated stoma after laryngeal resection 5 years ago. Because of this, he does not take pills orally but medications were administered to g-tube as possible.The patient is followed by [facility] Rad/Onc after ENT decided his second head and neck SCC was inoperable. We spoke with to [place], who discussed the patient's current functional status was worrisome, but will evaluate in office next week. The patient will have 6 week course of radiation potentially with concurrent chemotherapy. The palliative care team was consulted, and made comfort recommendations including Magic Mouthwash, restarting fentanyl patch at 24mcg/hr with oxycodone 5mg  q4h prn breakthrough, and following up with [facility] palliative medicine as he continues the treatment of this disease.   Severe Protein - Calorie Malnutrition  The patient had PCN noted on previous admission. Albumin of 2.7 in the past, Albumin 3.5 on admission. The patient was continued on Creon supplement and megace. There was a concern for refeeding syndrome based on his history of questionable nutrition through g-tube. His Magnesium and Phosphorous were within normal limits. A nutrition consult was obtained and recommended a tube feeding protocol that would provide adequate calories and free water. Recommended 65 mL Jevity 1.2 / hr with 120mL free water flushes QID. Due to his  cachexia, an air bed was ordered to reduce chances of getting a pressure ulcer.  Hypertension  The patient was admitted with hypotension. His home hypertensives were held during his admission, but bisoprolol was restarted on discharge.   Systolic CHF The patient has a history of CHF with Echo 04/10/2014 showing EF 50-55%, focal basal hypertrophy of septum, and grade 1 LV diastolic dysfunction. His fluid status was monitored closely and  cardioselective beta blocker restarted on discharge  Hyperlipidemia [name] home aspirin and Lipitor were held on admission and not restarted on discharge. May follow up re: these medications with PCP.   The patient's history of Cirrhosis of liver, EtOH dependence, Chronic Pancreatitis, and Hypothydroidism were noted. Medications were continued if appropriate, and significant laboratory tests were checked. There were no significant actions taken regarding these conditions. The patient was continued on SubQ heparin and an air bed for prophylaxis.   Issues for Follow Up:   Fluid Status / Dehydration  Home Hypertensives/lipitor held on admission, PCP may consider restarting at least CHF meds at follow-up if BP tolerates  Radiation treatment for [facility] of soft palate  Follow-Up with [facility] hospice  May follow-up with [facility] Neurology prn  Significant Procedures: EEG wnl, MRI Brain showed no mets, incidental sphenoidal meningioma.  Significant Labs and Imaging:   Recent Labs Lab 05/05/14 0517 05/06/14 0327 05/07/14 0230  WBC 9.3 5.3 9.2  HGB 9.9* 8.9* 8.8*  HCT 29.7* 26.6* 25.6*  PLT 211 174 195    Recent Labs Lab 05/04/14 1513 05/05/14 0139 05/05/14 0517 05/06/14 0327 05/07/14 0230  NA 135  --  137 133* 133*  K 4.9  --  5.3* 4.1 3.6  CL 98  --  104 109 106  CO2 27  --  21 18* 19  GLUCOSE 105*  --  89 99 125*  BUN 51*  --  32* 24* 12  CREATININE 1.49* 1.18 1.14 1.29 0.90  CALCIUM 9.7  --  8.7 7.3* 8.3*  MG  --   --   --  2.1 2.0  PHOS  --   --   --  3.3 3.6  ALKPHOS 101  --   --   --  127*  AST 31  --   --   --  30  ALT 37  --   --   --  43  ALBUMIN 3.5  --   --   --  2.3*    Results/Tests Pending at Time of Discharge: Blood cultures will be finalized 3/6  Discharge Medications:    Medication List    STOP taking these medications        amLODipine 5 MG tablet  Commonly known as:  NORVASC     atorvastatin 20 MG tablet  Commonly known as:  LIPITOR      hydrALAZINE 25 MG tablet  Commonly known as:  APRESOLINE     lipase/protease/amylase 12000 UNITS Cpep capsule  Commonly known as:  CREON     lisinopril 10 MG tablet  Commonly known as:  PRINIVIL,ZESTRIL     pantoprazole 40 MG tablet  Commonly known as:  PROTONIX      TAKE these medications        aspirin EC 81 MG tablet  Take 81 mg by mouth daily.     bisoprolol 10 MG tablet  Commonly known as:  ZEBETA  TAKE 1 TABLET BY MOUTH EVERY DAY     diclofenac sodium 1 % Gel  Commonly known as:  VOLTAREN  Apply 2 g topically 4 (four) times daily as needed (pain).     feeding supplement (JEVITY 1.2 CAL) Liqd  Place 65 mL/hr into feeding tube continuous.     fentaNYL 25 MCG/HR patch  Commonly known as:  DURAGESIC - dosed mcg/hr  Place 1 patch (25 mcg total) onto the skin every 3 (three) days.     free water Soln  Place 175 mLs into feeding tube 4 (four) times daily.     guaiFENesin 600 MG 12 hr tablet  Commonly known as:  MUCINEX  Take 600 mg by mouth daily as needed for cough or to loosen phlegm.     levETIRAcetam 100 MG/ML solution  Commonly known as:  KEPPRA  Place 5 mLs (500 mg total) into feeding tube 2 (two) times daily.     levothyroxine 200 MCG tablet  Commonly known as:  SYNTHROID, LEVOTHROID  Take 1 tablet (200 mcg total) by mouth daily before breakfast.     magic mouthwash w/lidocaine Soln  Take 5 mLs by mouth 4 (four) times daily.     megestrol 40 MG/ML suspension  Commonly known as:  MEGACE  Take 400 mg by mouth 2 (two) times daily.     ondansetron 4 MG tablet  Commonly known as:  ZOFRAN  Take 1 tablet (4 mg total) by mouth every 8 (eight) hours as needed for nausea or vomiting.     oxyCODONE 5 MG/5ML solution  Commonly known as:  ROXICODONE  Take 5 mLs (5 mg total) by mouth every 4 (four) hours as needed for severe pain.        Discharge Instructions: Please refer to Patient Instructions section of EMR for full details.  Patient was counseled  important signs and symptoms that should prompt return to medical care, changes in medications, dietary instructions, activity restrictions, and follow up appointments.   Follow-Up Appointments: Follow-up Information    Follow up with Kuneff, Renee, DO. Go on 05/26/2014.   Specialty:  Family Medicine   Why:  Appt scheduled at 10:30am   Contact information:   [place] [address] [phone]       Follow up with Francina Ames, MD On 05/12/2014.   Specialty:  Otolaryngology   Contact information:   [place] [address] [phone]       Follow up with Lorayne Bender, MD On 05/15/2014.   Specialty:  Radiology   Contact information:   [place] [address] [phone]       Follow up with [place].   Why:  HH-RN resumed   Contact information:   [address] [place] [address] [phone]       Follow up with [place].   Why:  Continous TF arranged with tube feed pump for home   Contact information:   [place] [address] [phone]       Nazir Hacker M Seth Higginbotham, MD 05/08/2014, [place]  I agree with the medical student note above and have edited it as necessary. I formulated the plan and performed my own physical exam which are documented above.  Beverlyn Roux, MD, MPH [facility] PGY-2 05/08/2014 8:10 AM

## 2014-05-07 NOTE — Evaluation (Signed)
Clinical/Bedside Swallow Evaluation Patient Details  Name: [name] MRN: [id number] Date of Birth: Sep 22, 1950  Today's Date: 05/07/2014 Time: SLP Start Time (ACUTE ONLY): 1508 SLP Stop Time (ACUTE ONLY): 1530 SLP Time Calculation (min) (ACUTE ONLY): 22 min  Past Medical History:  Past Medical History  Diagnosis Date  . Hypertension   . Cirrhosis of liver   . Hypothyroidism   . Alcohol abuse   . CHF (congestive heart failure)     EF 45-50% 05/2012  . Hyperlipidemia   . Arthritis   . Laryngeal cancer 2011    Laryngectomy, T3N0M0   Past Surgical History:  Past Surgical History  Procedure Laterality Date  . Tracheal surgery    . Tracheostomy    . Left and right heart catheterization with coronary angiogram N/A 12/28/2011    Procedure: LEFT AND RIGHT HEART CATHETERIZATION WITH CORONARY ANGIOGRAM;  Surgeon: Jolaine Artist, MD;  Location: [facility] CATH LAB;  Service: Cardiovascular;  Laterality: N/A;  . Esophagogastroduodenoscopy N/A 04/10/2014    Procedure: ESOPHAGOGASTRODUODENOSCOPY (EGD);  Surgeon: Missy Sabins, MD;  Location: [facility] ENDOSCOPY;  Service: Endoscopy;  Laterality: N/A;   HPI:  64 y.o. male presenting with Low blood pressures and dizziness at home and AKI found in the ED. Also with questionable seizure activity. MRI showed small meningioma which per MD, may or may not have contributed to symptoms. 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 Note that prior to discharge to [facility] for G-tube placement, patient on a mechanical soft diet.    Assessment / Plan / Recommendation Clinical Impression  Bedside swallow evaluation complete with focus on impact of new hypopharyngeal cancer diagnosis on swallowing  abilities. Patient with a previous total laryngectomy prohibiting aspiration. Per patient was on a soft diet prior to admission. Swallowing function today appearing intact without evidence of difficulty other than complaints of mild throat pain, likely a result of mass located along soft palate extending to tonsil area. From SLP standpoint, patient may consume soft, moistened solids to ease pain with swallow, thin liquids, as tolerated. No further SLP needs indicated.     Aspiration Risk  None    Diet Recommendation Dysphagia 3 (Mechanical Soft);Thin liquid   Liquid Administration via: Cup;Straw Medication Administration: Whole meds with liquid Supervision: Patient able to self feed Compensations: Slow rate;Small sips/bites Postural Changes and/or Swallow Maneuvers: Seated upright 90 degrees    Other  Recommendations Oral Care Recommendations: Oral care BID   Follow Up Recommendations  None       Pertinent Vitals/Pain n/a        Swallow Study    General HPI: 64 y.o. male presenting with Low blood pressures and dizziness at home and AKI found in the ED. Also with questionable seizure activity. MRI showed small meningioma which per MD, may or may not have contributed to symptoms. 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 Note that prior to discharge to [facility] for G-tube placement, patient on a mechanical soft diet.  Type of Study: Bedside swallow evaluation Previous Swallow Assessment: none noted Diet  Prior to this Study: NPO Temperature Spikes Noted: No Respiratory Status: Room air Trach Size and Type:  (open stoma) History of Recent Intubation: No Behavior/Cognition: Alert;Cooperative;Pleasant mood Oral Cavity -  Dentition: Edentulous Self-Feeding Abilities: Able to feed self Patient Positioning: Upright in bed Baseline Vocal Quality:  (aphonic due to total laryngectomy) Volitional Swallow: Able to elicit    Oral/Motor/Sensory Function Overall Oral Motor/Sensory Function: Appears within functional limits for tasks assessed (except new mass along hyopharynx-soft palate )   Ice Chips Ice chips: Within functional limits Presentation: Spoon   Thin Liquid Thin Liquid: Within functional limits (odynophagia)    Nectar Thick Nectar Thick Liquid: Not tested   Honey Thick Honey Thick Liquid: Not tested   Puree Puree: Within functional limits Presentation: Self Fed;Spoon   Solid   GO   Amandeep Hogston MA, CCC-SLP [phone]  Solid: Within functional limits (soft solids) Presentation: Self Fed       Houda Brau Meryl 05/07/2014,3:49 PM

## 2014-05-09 DIAGNOSIS — E43 Unspecified severe protein-calorie malnutrition: Secondary | ICD-10-CM | POA: Diagnosis not present

## 2014-05-09 DIAGNOSIS — C321 Malignant neoplasm of supraglottis: Secondary | ICD-10-CM | POA: Diagnosis not present

## 2014-05-09 DIAGNOSIS — C140 Malignant neoplasm of pharynx, unspecified: Secondary | ICD-10-CM | POA: Diagnosis not present

## 2014-05-09 DIAGNOSIS — R1310 Dysphagia, unspecified: Secondary | ICD-10-CM | POA: Diagnosis not present

## 2014-05-09 DIAGNOSIS — I5021 Acute systolic (congestive) heart failure: Secondary | ICD-10-CM | POA: Diagnosis not present

## 2014-05-09 DIAGNOSIS — R636 Underweight: Secondary | ICD-10-CM | POA: Diagnosis not present

## 2014-05-10 ENCOUNTER — Emergency Department (HOSPITAL_COMMUNITY)
Admission: EM | Admit: 2014-05-10 | Discharge: 2014-05-10 | Disposition: A | Discharge status: Home / Self Care | Payer: Medicare Other | Attending: Emergency Medicine | Admitting: Emergency Medicine

## 2014-05-10 ENCOUNTER — Emergency Department (HOSPITAL_COMMUNITY): Payer: Medicare Other

## 2014-05-10 ENCOUNTER — Encounter (HOSPITAL_COMMUNITY): Payer: Self-pay | Admitting: Emergency Medicine

## 2014-05-10 DIAGNOSIS — Z8719 Personal history of other diseases of the digestive system: Secondary | ICD-10-CM | POA: Diagnosis not present

## 2014-05-10 DIAGNOSIS — Z9889 Other specified postprocedural states: Secondary | ICD-10-CM | POA: Diagnosis not present

## 2014-05-10 DIAGNOSIS — Z8521 Personal history of malignant neoplasm of larynx: Secondary | ICD-10-CM | POA: Insufficient documentation

## 2014-05-10 DIAGNOSIS — K9429 Other complications of gastrostomy: Secondary | ICD-10-CM | POA: Diagnosis not present

## 2014-05-10 DIAGNOSIS — M1990 Unspecified osteoarthritis, unspecified site: Secondary | ICD-10-CM | POA: Insufficient documentation

## 2014-05-10 DIAGNOSIS — I10 Essential (primary) hypertension: Secondary | ICD-10-CM | POA: Insufficient documentation

## 2014-05-10 DIAGNOSIS — Z7982 Long term (current) use of aspirin: Secondary | ICD-10-CM | POA: Insufficient documentation

## 2014-05-10 DIAGNOSIS — T85598A Other mechanical complication of other gastrointestinal prosthetic devices, implants and grafts, initial encounter: Secondary | ICD-10-CM | POA: Insufficient documentation

## 2014-05-10 DIAGNOSIS — Z793 Long term (current) use of hormonal contraceptives: Secondary | ICD-10-CM | POA: Insufficient documentation

## 2014-05-10 DIAGNOSIS — Z79899 Other long term (current) drug therapy: Secondary | ICD-10-CM | POA: Diagnosis not present

## 2014-05-10 DIAGNOSIS — E039 Hypothyroidism, unspecified: Secondary | ICD-10-CM | POA: Diagnosis not present

## 2014-05-10 DIAGNOSIS — Y831 Surgical operation with implant of artificial internal device as the cause of abnormal reaction of the patient, or of later complication, without mention of misadventure at the time of the procedure: Secondary | ICD-10-CM | POA: Diagnosis not present

## 2014-05-10 DIAGNOSIS — Z87891 Personal history of nicotine dependence: Secondary | ICD-10-CM | POA: Insufficient documentation

## 2014-05-10 DIAGNOSIS — K9423 Gastrostomy malfunction: Secondary | ICD-10-CM

## 2014-05-10 DIAGNOSIS — K9420 Gastrostomy complication, unspecified: Secondary | ICD-10-CM

## 2014-05-10 DIAGNOSIS — Z4682 Encounter for fitting and adjustment of non-vascular catheter: Secondary | ICD-10-CM | POA: Diagnosis not present

## 2014-05-10 DIAGNOSIS — I509 Heart failure, unspecified: Secondary | ICD-10-CM | POA: Insufficient documentation

## 2014-05-10 DIAGNOSIS — R000 Tachycardia, unspecified: Secondary | ICD-10-CM | POA: Diagnosis not present

## 2014-05-10 MED ORDER — IOHEXOL 300 MG/ML  SOLN
50.0000 mL | Freq: Once | INTRAMUSCULAR | Status: AC | PRN
  Administered 2014-05-10: 50 mL

## 2014-05-10 NOTE — ED Provider Notes (Signed)
CSN: [id number]     Arrival date & time 05/10/14  0057 History   First MD Initiated Contact with Patient 05/10/14 0252     Chief Complaint  Patient presents with  . feeding tube dislodged     18Fr     (Consider location/radiation/quality/duration/timing/severity/associated sxs/prior Treatment) HPI Comments: Patient with a history of throat cancer with recent placement of gastric feeding tube (04/2014) here with caregiver who reports the feeding tube was accidentally dislodged at home earlier this evening. No pain, no fever. He is at baseline otherwise.   The history is provided by the patient. No language interpreter was used.    Past Medical History  Diagnosis Date  . Hypertension   . Cirrhosis of liver   . Hypothyroidism   . Alcohol abuse   . CHF (congestive heart failure)     EF 45-50% 05/2012  . Hyperlipidemia   . Arthritis   . Laryngeal cancer 2011    Laryngectomy, T3N0M0   Past Surgical History  Procedure Laterality Date  . Tracheal surgery    . Tracheostomy    . Left and right heart catheterization with coronary angiogram N/A 12/28/2011    Procedure: LEFT AND RIGHT HEART CATHETERIZATION WITH CORONARY ANGIOGRAM;  Surgeon: Jolaine Artist, MD;  Location: [facility] CATH LAB;  Service: Cardiovascular;  Laterality: N/A;  . Esophagogastroduodenoscopy N/A 04/10/2014    Procedure: ESOPHAGOGASTRODUODENOSCOPY (EGD);  Surgeon: Missy Sabins, MD;  Location: [facility] ENDOSCOPY;  Service: Endoscopy;  Laterality: N/A;   History reviewed. No pertinent family history. History  Substance Use Topics  . Smoking status: Former Smoker -- 0.20 packs/day for 50 years    Types: Cigarettes  . Smokeless tobacco: Never Used  . Alcohol Use: Yes     Comment: "rarely"    Review of Systems  Constitutional: Negative for fever and chills.  HENT: Negative.   Respiratory: Negative.   Cardiovascular: Negative.   Gastrointestinal:       See HPI.  Musculoskeletal: Negative.   Skin: Negative.    Neurological: Negative.       Allergies  Vicodin; Other; and Tylenol  Home Medications   Prior to Admission medications   Medication Sig Start Date End Date Taking? Authorizing Provider  Alum & Mag Hydroxide-Simeth (MAGIC MOUTHWASH W/LIDOCAINE) SOLN Take 5 mLs by mouth 4 (four) times daily. 05/07/14  Yes Frazier Richards, MD  aspirin EC 81 MG tablet Take 81 mg by mouth daily. 03/12/14  Yes Historical Provider, MD  diclofenac sodium (VOLTAREN) 1 % GEL Apply 2 g topically 4 (four) times daily as needed (pain).    Yes Historical Provider, MD  guaiFENesin (MUCINEX) 600 MG 12 hr tablet Take 600 mg by mouth daily as needed for cough or to loosen phlegm.    Yes Historical Provider, MD  levETIRAcetam (KEPPRA) 100 MG/ML solution Place 5 mLs (500 mg total) into feeding tube 2 (two) times daily. 05/07/14  Yes Frazier Richards, MD  levothyroxine (SYNTHROID, LEVOTHROID) 200 MCG tablet Take 1 tablet (200 mcg total) by mouth daily before breakfast. 04/14/14  Yes Ashly M Gottschalk, DO  megestrol (MEGACE) 40 MG/ML suspension Take 400 mg by mouth 2 (two) times daily. 04/20/14  Yes Historical Provider, MD  Nutritional Supplements (FEEDING SUPPLEMENT, JEVITY 1.2 CAL,) LIQD Place 65 mL/hr into feeding tube continuous. 05/07/14  Yes Frazier Richards, MD  ondansetron (ZOFRAN) 4 MG tablet Take 1 tablet (4 mg total) by mouth every 8 (eight) hours as needed for nausea or vomiting. 05/14/13  Yes  Leone Brand, MD  oxyCODONE (ROXICODONE) 5 MG/5ML solution Take 5 mLs (5 mg total) by mouth every 4 (four) hours as needed for severe pain. 05/07/14  Yes Frazier Richards, MD  Water For Irrigation, Sterile (FREE WATER) SOLN Place 175 mLs into feeding tube 4 (four) times daily. 05/07/14  Yes Frazier Richards, MD  bisoprolol (ZEBETA) 10 MG tablet TAKE 1 TABLET BY MOUTH EVERY DAY 03/23/14   Renee A Kuneff, DO  fentaNYL (DURAGESIC - DOSED MCG/HR) 25 MCG/HR patch Place 1 patch (25 mcg total) onto the skin every 3 (three) days. 05/07/14   Frazier Richards, MD    BP 113/87 mmHg  Pulse 126  Temp(Src) 98.6 F (37 C) (Oral)  Resp 18  Wt 106 lb (48.081 kg)  SpO2 100% Physical Exam  Constitutional: He appears well-developed and well-nourished.  HENT:  Head: Normocephalic.  Post surgical changes to neck.   Neck: Normal range of motion. Neck supple.  Cardiovascular: Normal rate and regular rhythm.   Pulmonary/Chest: Effort normal and breath sounds normal.  Abdominal: Soft. Bowel sounds are normal. There is no tenderness. There is no rebound and no guarding.  Stoma overlying stomach unremarkable. No swelling, bleeding or purulence. Nontender abdomen.  Musculoskeletal: Normal range of motion.  Neurological: He is alert.  Skin: Skin is warm and dry. No rash noted.  Psychiatric: He has a normal mood and affect.    ED Course  Procedures (including critical care time) Labs Review Labs Reviewed - No data to display  Imaging Review No results found.   EKG Interpretation None      MDM   Final diagnoses:  None    1. Gastric tube replacement  18 Fr. Foley easily passed through stoma. Placement confirmed via imaging. He will follow up with [facility] for quick replacement to gastric tube.    Dewaine Oats, PA-C 05/10/14 0349  Everlene Balls, MD 05/10/14 1421

## 2014-05-10 NOTE — Discharge Instructions (Signed)
FOLLOW UP WITH [facility] LATER TODAY FOR EVALUATION BY YOUR GASTROENTEROLOGIST OR YOUR SURGEON FOR REPLACEMENT OF GASTRIC FEEDING TUBE.

## 2014-05-10 NOTE — ED Notes (Signed)
Patient feeding tube dislodged at @ midnight. 18Fr.

## 2014-05-11 DIAGNOSIS — I5021 Acute systolic (congestive) heart failure: Secondary | ICD-10-CM | POA: Diagnosis not present

## 2014-05-11 DIAGNOSIS — C140 Malignant neoplasm of pharynx, unspecified: Secondary | ICD-10-CM | POA: Diagnosis not present

## 2014-05-11 DIAGNOSIS — R1310 Dysphagia, unspecified: Secondary | ICD-10-CM | POA: Diagnosis not present

## 2014-05-11 DIAGNOSIS — R636 Underweight: Secondary | ICD-10-CM | POA: Diagnosis not present

## 2014-05-11 DIAGNOSIS — C321 Malignant neoplasm of supraglottis: Secondary | ICD-10-CM | POA: Diagnosis not present

## 2014-05-11 DIAGNOSIS — E43 Unspecified severe protein-calorie malnutrition: Secondary | ICD-10-CM | POA: Diagnosis not present

## 2014-05-11 LAB — CULTURE, BLOOD (ROUTINE X 2)
CULTURE: NO GROWTH
Culture: NO GROWTH

## 2014-05-12 DIAGNOSIS — C051 Malignant neoplasm of soft palate: Secondary | ICD-10-CM | POA: Diagnosis not present

## 2014-05-12 DIAGNOSIS — Z431 Encounter for attention to gastrostomy: Secondary | ICD-10-CM | POA: Diagnosis not present

## 2014-05-12 DIAGNOSIS — C321 Malignant neoplasm of supraglottis: Secondary | ICD-10-CM | POA: Diagnosis not present

## 2014-05-13 DIAGNOSIS — I5021 Acute systolic (congestive) heart failure: Secondary | ICD-10-CM | POA: Diagnosis not present

## 2014-05-13 DIAGNOSIS — R1310 Dysphagia, unspecified: Secondary | ICD-10-CM | POA: Diagnosis not present

## 2014-05-13 DIAGNOSIS — C321 Malignant neoplasm of supraglottis: Secondary | ICD-10-CM | POA: Diagnosis not present

## 2014-05-13 DIAGNOSIS — C140 Malignant neoplasm of pharynx, unspecified: Secondary | ICD-10-CM | POA: Diagnosis not present

## 2014-05-13 DIAGNOSIS — R636 Underweight: Secondary | ICD-10-CM | POA: Diagnosis not present

## 2014-05-13 DIAGNOSIS — E43 Unspecified severe protein-calorie malnutrition: Secondary | ICD-10-CM | POA: Diagnosis not present

## 2014-05-15 DIAGNOSIS — R1310 Dysphagia, unspecified: Secondary | ICD-10-CM | POA: Diagnosis not present

## 2014-05-15 DIAGNOSIS — Z510 Encounter for antineoplastic radiation therapy: Secondary | ICD-10-CM | POA: Diagnosis not present

## 2014-05-15 DIAGNOSIS — I5021 Acute systolic (congestive) heart failure: Secondary | ICD-10-CM | POA: Diagnosis not present

## 2014-05-15 DIAGNOSIS — C051 Malignant neoplasm of soft palate: Secondary | ICD-10-CM | POA: Diagnosis not present

## 2014-05-15 DIAGNOSIS — R636 Underweight: Secondary | ICD-10-CM | POA: Diagnosis not present

## 2014-05-15 DIAGNOSIS — E43 Unspecified severe protein-calorie malnutrition: Secondary | ICD-10-CM | POA: Diagnosis not present

## 2014-05-15 DIAGNOSIS — C321 Malignant neoplasm of supraglottis: Secondary | ICD-10-CM | POA: Diagnosis not present

## 2014-05-15 DIAGNOSIS — C140 Malignant neoplasm of pharynx, unspecified: Secondary | ICD-10-CM | POA: Diagnosis not present

## 2014-05-18 DIAGNOSIS — E43 Unspecified severe protein-calorie malnutrition: Secondary | ICD-10-CM | POA: Diagnosis not present

## 2014-05-18 DIAGNOSIS — R636 Underweight: Secondary | ICD-10-CM | POA: Diagnosis not present

## 2014-05-18 DIAGNOSIS — C140 Malignant neoplasm of pharynx, unspecified: Secondary | ICD-10-CM | POA: Diagnosis not present

## 2014-05-18 DIAGNOSIS — C321 Malignant neoplasm of supraglottis: Secondary | ICD-10-CM | POA: Diagnosis not present

## 2014-05-18 DIAGNOSIS — I5021 Acute systolic (congestive) heart failure: Secondary | ICD-10-CM | POA: Diagnosis not present

## 2014-05-18 DIAGNOSIS — R1310 Dysphagia, unspecified: Secondary | ICD-10-CM | POA: Diagnosis not present

## 2014-05-21 ENCOUNTER — Other Ambulatory Visit: Payer: Self-pay | Admitting: Family Medicine

## 2014-05-21 DIAGNOSIS — C321 Malignant neoplasm of supraglottis: Secondary | ICD-10-CM | POA: Diagnosis not present

## 2014-05-21 DIAGNOSIS — I5021 Acute systolic (congestive) heart failure: Secondary | ICD-10-CM | POA: Diagnosis not present

## 2014-05-21 DIAGNOSIS — E43 Unspecified severe protein-calorie malnutrition: Secondary | ICD-10-CM | POA: Diagnosis not present

## 2014-05-21 DIAGNOSIS — R1310 Dysphagia, unspecified: Secondary | ICD-10-CM | POA: Diagnosis not present

## 2014-05-21 DIAGNOSIS — R636 Underweight: Secondary | ICD-10-CM | POA: Diagnosis not present

## 2014-05-21 DIAGNOSIS — C140 Malignant neoplasm of pharynx, unspecified: Secondary | ICD-10-CM | POA: Diagnosis not present

## 2014-05-22 ENCOUNTER — Telehealth: Payer: Self-pay | Admitting: *Deleted

## 2014-05-22 DIAGNOSIS — C051 Malignant neoplasm of soft palate: Secondary | ICD-10-CM | POA: Diagnosis not present

## 2014-05-22 DIAGNOSIS — C140 Malignant neoplasm of pharynx, unspecified: Secondary | ICD-10-CM | POA: Diagnosis not present

## 2014-05-22 DIAGNOSIS — Z510 Encounter for antineoplastic radiation therapy: Secondary | ICD-10-CM | POA: Diagnosis not present

## 2014-05-22 NOTE — Telephone Encounter (Signed)
On 05-22-14 fax medical records to [facility], and [employer] to bill hinson

## 2014-05-25 DIAGNOSIS — C051 Malignant neoplasm of soft palate: Secondary | ICD-10-CM | POA: Diagnosis not present

## 2014-05-25 DIAGNOSIS — C321 Malignant neoplasm of supraglottis: Secondary | ICD-10-CM | POA: Diagnosis not present

## 2014-05-25 DIAGNOSIS — I5021 Acute systolic (congestive) heart failure: Secondary | ICD-10-CM | POA: Diagnosis not present

## 2014-05-25 DIAGNOSIS — R1310 Dysphagia, unspecified: Secondary | ICD-10-CM | POA: Diagnosis not present

## 2014-05-25 DIAGNOSIS — Z510 Encounter for antineoplastic radiation therapy: Secondary | ICD-10-CM | POA: Diagnosis not present

## 2014-05-25 DIAGNOSIS — C140 Malignant neoplasm of pharynx, unspecified: Secondary | ICD-10-CM | POA: Diagnosis not present

## 2014-05-25 DIAGNOSIS — R636 Underweight: Secondary | ICD-10-CM | POA: Diagnosis not present

## 2014-05-25 DIAGNOSIS — E43 Unspecified severe protein-calorie malnutrition: Secondary | ICD-10-CM | POA: Diagnosis not present

## 2014-05-26 ENCOUNTER — Encounter: Payer: Self-pay | Admitting: Family Medicine

## 2014-05-26 ENCOUNTER — Ambulatory Visit (INDEPENDENT_AMBULATORY_CARE_PROVIDER_SITE_OTHER): Payer: Medicare Other | Admitting: Family Medicine

## 2014-05-26 VITALS — BP 112/78 | HR 110 | Temp 97.9°F | Ht 71.0 in | Wt 112.9 lb

## 2014-05-26 DIAGNOSIS — C329 Malignant neoplasm of larynx, unspecified: Secondary | ICD-10-CM | POA: Diagnosis not present

## 2014-05-26 DIAGNOSIS — I10 Essential (primary) hypertension: Secondary | ICD-10-CM

## 2014-05-26 MED ORDER — BISOPROLOL FUMARATE 10 MG PO TABS: 10.0000 mg | ORAL_TABLET | Freq: Every day | ORAL | Status: DC

## 2014-05-26 MED ORDER — MEGESTROL ACETATE 40 MG/ML PO SUSP: 400.0000 mg | Freq: Two times a day (BID) | ORAL | Status: DC

## 2014-05-26 MED ORDER — DICLOFENAC SODIUM 1 % TD GEL: 2.0000 g | Freq: Four times a day (QID) | TRANSDERMAL | Status: DC | PRN

## 2014-05-26 MED ORDER — GUAIFENESIN ER 600 MG PO TB12: 600.0000 mg | ORAL_TABLET | Freq: Two times a day (BID) | ORAL | Status: DC

## 2014-05-26 NOTE — Progress Notes (Signed)
   Subjective:    [name], male    DOB: 02-24-1951, 64 y.o.   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 Patient following with [facility], has rado/oncology appointment tomorrow. He is following up with pain management next week. Currently he states that his only complaint is left-sided throat pain. His daughter is with him today and states that he is currently on a fentanyl patch as well as 5 mg oxycodone solution, which seems to be helping with the pain but not getting complete resolution. Patient has had a 6 pound weight gain since hospital discharge and PEG tube insertion.  Hypotension: Patient presents to family medicine clinic today for follow-up hospital visit after having low blood pressure. It was for a time secondary to possible sepsis/Sirs, however no infectious source was found. His blood pressure resolved, and Isopril and HCTZ was discontinued, this protocol was continued. His daughter stateshis blood pressure has been "all over the place" over the last few weeks. She at one time I had something to do with pain medications, however she called her the pain medication time, and this did not seem to affect his blood pressure at all. [name] blood pressure the last few days has been stable, it is stable today. He is asymptomatic.  Former smoker Past Medical History  Diagnosis Date  . Hypertension   . Cirrhosis of liver   . Hypothyroidism   . Alcohol abuse   . CHF (congestive heart failure)     EF 45-50% 05/2012  . Hyperlipidemia   . Arthritis   . Laryngeal cancer 2011    Laryngectomy, T3N0M0   Allergies  Allergen Reactions  . Vicodin [Hydrocodone-Acetaminophen] Rash  . Other     Unknown anesthesia medicine caused cardiac arrest.  . Tylenol [Acetaminophen]     HBP -Liver Cirrhosis    Review of Systems Per HPI    Objective:   Physical Exam BP 112/78 mmHg  Pulse 110  Temp(Src) 97.9 F (36.6 C) (Oral)  Ht 5\' 11"  (1.803 m)  Wt 112 lb 14.4 oz (51.211 kg)  BMI 15.75 kg/m2 Gen: NAD. Fatigued, cachectic. African-American male. HEENT: AT. [place].  Bilateral eyes with mild injections and icterus. MMM.  CV: Mildly tachycardic, regular rhythm Chest: CTAB, no wheeze or crackles Abd: Soft. Flat. NTND. BS present. PEG tube intact, dressing with minimal drainage.  Ext: No erythema. No edema.     Assessment & Plan:

## 2014-05-26 NOTE — Assessment & Plan Note (Addendum)
She has been diagnosed with recurrent neoplasm. Patient has radio oncology appointment tomorrow. Pain management appointment next week. Only complaint today is left sided throat pain. Patient is prescribed medications, with pain management referral next week to assist in management. His PEG tube today looks good, very minimal drainage no erythema dressing clean dry and intact. Tube feelings are going well. Patient has gained 6 pounds. His blood pressure has remained stable. Refill on Megace and Mucinex today. Follow-up in 3 months, or sooner if needed.

## 2014-05-26 NOTE — Patient Instructions (Signed)
It was a pleasure seeing you again today. I have called and all the medications you requested. If he is unable to swallow the Mucinex pills, then give me a call back and I will call in the suspension form. His blood pressure looks great today, continue to monitor his blood pressure 2 times a day and write it down, if you get anything below 100 on the top number, or he becomes symptomatic such as dizzy, passing out or increased fatigue and call us immediately and hold his BP medication. Neck certain you have a follow-up appointment with neurology for his new seizure activity. Great job on taking care of him, he has gained 6 pounds which will eventually help him with his healing.

## 2014-05-26 NOTE — Assessment & Plan Note (Signed)
Blood pressure is at goal today. Advised his daughter to monitor his blood pressure twice a day and record these for [place]. If the patient is symptomatic or his blood pressure is below 161 systolic, she is to stop his bisoprolol and taking either to the emergency room, or same-day clinic depending on symptoms. Refills on this bisoprolol today.

## 2014-05-27 ENCOUNTER — Inpatient Hospital Stay (HOSPITAL_COMMUNITY)
Admission: EM | Admit: 2014-05-27 | Discharge: 2014-05-30 | DRG: 100 | Disposition: A | Discharge status: Home / Self Care | Payer: Medicare Other | Attending: Family Medicine | Admitting: Family Medicine

## 2014-05-27 ENCOUNTER — Emergency Department (HOSPITAL_COMMUNITY): Payer: Medicare Other

## 2014-05-27 ENCOUNTER — Encounter (HOSPITAL_COMMUNITY): Payer: Self-pay

## 2014-05-27 DIAGNOSIS — G40909 Epilepsy, unspecified, not intractable, without status epilepticus: Secondary | ICD-10-CM | POA: Diagnosis not present

## 2014-05-27 DIAGNOSIS — R509 Fever, unspecified: Secondary | ICD-10-CM

## 2014-05-27 DIAGNOSIS — R911 Solitary pulmonary nodule: Secondary | ICD-10-CM | POA: Diagnosis present

## 2014-05-27 DIAGNOSIS — Z888 Allergy status to other drugs, medicaments and biological substances status: Secondary | ICD-10-CM | POA: Diagnosis not present

## 2014-05-27 DIAGNOSIS — G253 Myoclonus: Secondary | ICD-10-CM | POA: Diagnosis present

## 2014-05-27 DIAGNOSIS — C140 Malignant neoplasm of pharynx, unspecified: Secondary | ICD-10-CM | POA: Diagnosis not present

## 2014-05-27 DIAGNOSIS — R4182 Altered mental status, unspecified: Secondary | ICD-10-CM | POA: Diagnosis not present

## 2014-05-27 DIAGNOSIS — E785 Hyperlipidemia, unspecified: Secondary | ICD-10-CM | POA: Diagnosis present

## 2014-05-27 DIAGNOSIS — Z87891 Personal history of nicotine dependence: Secondary | ICD-10-CM

## 2014-05-27 DIAGNOSIS — E43 Unspecified severe protein-calorie malnutrition: Secondary | ICD-10-CM | POA: Diagnosis not present

## 2014-05-27 DIAGNOSIS — K861 Other chronic pancreatitis: Secondary | ICD-10-CM | POA: Diagnosis present

## 2014-05-27 DIAGNOSIS — R5081 Fever presenting with conditions classified elsewhere: Secondary | ICD-10-CM | POA: Diagnosis not present

## 2014-05-27 DIAGNOSIS — I10 Essential (primary) hypertension: Secondary | ICD-10-CM | POA: Diagnosis present

## 2014-05-27 DIAGNOSIS — Z681 Body mass index (BMI) 19 or less, adult: Secondary | ICD-10-CM

## 2014-05-27 DIAGNOSIS — R569 Unspecified convulsions: Secondary | ICD-10-CM | POA: Diagnosis not present

## 2014-05-27 DIAGNOSIS — E273 Drug-induced adrenocortical insufficiency: Secondary | ICD-10-CM | POA: Diagnosis present

## 2014-05-27 DIAGNOSIS — A419 Sepsis, unspecified organism: Secondary | ICD-10-CM | POA: Diagnosis present

## 2014-05-27 DIAGNOSIS — E039 Hypothyroidism, unspecified: Secondary | ICD-10-CM | POA: Diagnosis not present

## 2014-05-27 DIAGNOSIS — R404 Transient alteration of awareness: Secondary | ICD-10-CM | POA: Diagnosis not present

## 2014-05-27 DIAGNOSIS — C329 Malignant neoplasm of larynx, unspecified: Secondary | ICD-10-CM | POA: Diagnosis not present

## 2014-05-27 DIAGNOSIS — I5022 Chronic systolic (congestive) heart failure: Secondary | ICD-10-CM | POA: Diagnosis present

## 2014-05-27 DIAGNOSIS — C102 Malignant neoplasm of lateral wall of oropharynx: Secondary | ICD-10-CM | POA: Diagnosis not present

## 2014-05-27 DIAGNOSIS — J449 Chronic obstructive pulmonary disease, unspecified: Secondary | ICD-10-CM | POA: Diagnosis present

## 2014-05-27 DIAGNOSIS — J45909 Unspecified asthma, uncomplicated: Secondary | ICD-10-CM | POA: Diagnosis not present

## 2014-05-27 DIAGNOSIS — G893 Neoplasm related pain (acute) (chronic): Secondary | ICD-10-CM | POA: Diagnosis not present

## 2014-05-27 DIAGNOSIS — Z931 Gastrostomy status: Secondary | ICD-10-CM

## 2014-05-27 DIAGNOSIS — Z515 Encounter for palliative care: Secondary | ICD-10-CM

## 2014-05-27 DIAGNOSIS — C051 Malignant neoplasm of soft palate: Secondary | ICD-10-CM | POA: Diagnosis not present

## 2014-05-27 DIAGNOSIS — I5042 Chronic combined systolic (congestive) and diastolic (congestive) heart failure: Secondary | ICD-10-CM | POA: Diagnosis present

## 2014-05-27 DIAGNOSIS — R55 Syncope and collapse: Secondary | ICD-10-CM | POA: Diagnosis not present

## 2014-05-27 DIAGNOSIS — F1020 Alcohol dependence, uncomplicated: Secondary | ICD-10-CM | POA: Diagnosis present

## 2014-05-27 DIAGNOSIS — Z85819 Personal history of malignant neoplasm of unspecified site of lip, oral cavity, and pharynx: Secondary | ICD-10-CM

## 2014-05-27 DIAGNOSIS — N179 Acute kidney failure, unspecified: Secondary | ICD-10-CM | POA: Diagnosis present

## 2014-05-27 DIAGNOSIS — Z930 Tracheostomy status: Secondary | ICD-10-CM | POA: Diagnosis not present

## 2014-05-27 DIAGNOSIS — R627 Adult failure to thrive: Secondary | ICD-10-CM | POA: Diagnosis not present

## 2014-05-27 DIAGNOSIS — I959 Hypotension, unspecified: Secondary | ICD-10-CM | POA: Diagnosis not present

## 2014-05-27 DIAGNOSIS — K7460 Unspecified cirrhosis of liver: Secondary | ICD-10-CM | POA: Diagnosis not present

## 2014-05-27 DIAGNOSIS — J9610 Chronic respiratory failure, unspecified whether with hypoxia or hypercapnia: Secondary | ICD-10-CM | POA: Diagnosis present

## 2014-05-27 DIAGNOSIS — C109 Malignant neoplasm of oropharynx, unspecified: Secondary | ICD-10-CM | POA: Diagnosis not present

## 2014-05-27 DIAGNOSIS — K7030 Alcoholic cirrhosis of liver without ascites: Secondary | ICD-10-CM | POA: Diagnosis present

## 2014-05-27 DIAGNOSIS — M1990 Unspecified osteoarthritis, unspecified site: Secondary | ICD-10-CM | POA: Diagnosis present

## 2014-05-27 DIAGNOSIS — I509 Heart failure, unspecified: Secondary | ICD-10-CM | POA: Diagnosis not present

## 2014-05-27 DIAGNOSIS — Z510 Encounter for antineoplastic radiation therapy: Secondary | ICD-10-CM | POA: Diagnosis not present

## 2014-05-27 HISTORY — DX: Unspecified convulsions: R56.9

## 2014-05-27 LAB — URINALYSIS, ROUTINE W REFLEX MICROSCOPIC
BILIRUBIN URINE: NEGATIVE
Glucose, UA: NEGATIVE mg/dL
Hgb urine dipstick: NEGATIVE
Ketones, ur: NEGATIVE mg/dL
Leukocytes, UA: NEGATIVE
Nitrite: NEGATIVE
PROTEIN: NEGATIVE mg/dL
SPECIFIC GRAVITY, URINE: 1.017 (ref 1.005–1.030)
UROBILINOGEN UA: 1 mg/dL (ref 0.0–1.0)
pH: 7.5 (ref 5.0–8.0)

## 2014-05-27 LAB — COMPREHENSIVE METABOLIC PANEL
ALK PHOS: 123 U/L — AB (ref 39–117)
ALT: 35 U/L (ref 0–53)
AST: 28 U/L (ref 0–37)
Albumin: 3 g/dL — ABNORMAL LOW (ref 3.5–5.2)
Anion gap: 8 (ref 5–15)
BILIRUBIN TOTAL: 1.1 mg/dL (ref 0.3–1.2)
BUN: 24 mg/dL — ABNORMAL HIGH (ref 6–23)
CHLORIDE: 105 mmol/L (ref 96–112)
CO2: 21 mmol/L (ref 19–32)
Calcium: 7.9 mg/dL — ABNORMAL LOW (ref 8.4–10.5)
Creatinine, Ser: 0.82 mg/dL (ref 0.50–1.35)
GFR calc Af Amer: >90 mL/min (ref >90)
GFR calc non Af Amer: >90 mL/min (ref >90)
Glucose, Bld: 96 mg/dL (ref 70–99)
POTASSIUM: 3.9 mmol/L (ref 3.5–5.1)
SODIUM: 134 mmol/L — AB (ref 135–145)
Total Protein: 7.3 g/dL (ref 6.0–8.3)

## 2014-05-27 LAB — CBC WITH DIFFERENTIAL/PLATELET
BASOS PCT: 0 % (ref 0–1)
Basophils Absolute: 0 10*3/uL (ref 0.0–0.1)
Eosinophils Absolute: 0.1 10*3/uL (ref 0.0–0.7)
Eosinophils Relative: 1 % (ref 0–5)
HEMATOCRIT: 34.2 % — AB (ref 39.0–52.0)
Hemoglobin: 11.2 g/dL — ABNORMAL LOW (ref 13.0–17.0)
Lymphocytes Relative: 26 % (ref 12–46)
Lymphs Abs: 1.8 10*3/uL (ref 0.7–4.0)
MCH: 27.5 pg (ref 26.0–34.0)
MCHC: 32.7 g/dL (ref 30.0–36.0)
MCV: 83.8 fL (ref 78.0–100.0)
MONO ABS: 1.1 10*3/uL — AB (ref 0.1–1.0)
Monocytes Relative: 16 % — ABNORMAL HIGH (ref 3–12)
Neutro Abs: 4 10*3/uL (ref 1.7–7.7)
Neutrophils Relative %: 57 % (ref 43–77)
Platelets: 227 10*3/uL (ref 150–400)
RBC: 4.08 MIL/uL — ABNORMAL LOW (ref 4.22–5.81)
RDW: 18.2 % — AB (ref 11.5–15.5)
WBC: 7 10*3/uL (ref 4.0–10.5)

## 2014-05-27 LAB — CBG MONITORING, ED: GLUCOSE-CAPILLARY: 72 mg/dL (ref 70–99)

## 2014-05-27 LAB — I-STAT CG4 LACTIC ACID, ED: Lactic Acid, Venous: 3.65 mmol/L (ref 0.5–2.0)

## 2014-05-27 LAB — TROPONIN I: Troponin I: <0.03 ng/mL (ref <0.031)

## 2014-05-27 MED ORDER — FREE WATER
175.0000 mL | Freq: Four times a day (QID) | Status: DC
Start: 2014-05-27 — End: 2014-05-30
  Administered 2014-05-28 – 2014-05-29 (×7): 175 mL

## 2014-05-27 MED ORDER — ONDANSETRON HCL 4 MG PO TABS: 4.0000 mg | ORAL_TABLET | Freq: Three times a day (TID) | ORAL | Status: DC | PRN

## 2014-05-27 MED ORDER — SODIUM CHLORIDE 0.9 % IV BOLUS (SEPSIS)
1000.0000 mL | INTRAVENOUS | Status: AC
  Administered 2014-05-27 (×2): 1000 mL via INTRAVENOUS

## 2014-05-27 MED ORDER — BISOPROLOL FUMARATE 10 MG PO TABS
10.0000 mg | ORAL_TABLET | Freq: Every day | ORAL | Status: DC
  Administered 2014-05-29 – 2014-05-30 (×2): 10 mg via ORAL
  Filled 2014-05-27 (×3): qty 1

## 2014-05-27 MED ORDER — DICLOFENAC SODIUM 1 % TD GEL: 2.0000 g | Freq: Four times a day (QID) | TRANSDERMAL | Status: DC | PRN

## 2014-05-27 MED ORDER — PIPERACILLIN-TAZOBACTAM 3.375 G IVPB 30 MIN
3.3750 g | Freq: Once | INTRAVENOUS | Status: DC
  Filled 2014-05-27: qty 50

## 2014-05-27 MED ORDER — MAGIC MOUTHWASH W/LIDOCAINE
5.0000 mL | Freq: Four times a day (QID) | ORAL | Status: DC
  Administered 2014-05-27 – 2014-05-30 (×9): 5 mL via ORAL
  Filled 2014-05-27 (×14): qty 5

## 2014-05-27 MED ORDER — MEGESTROL ACETATE 40 MG/ML PO SUSP
400.0000 mg | Freq: Two times a day (BID) | ORAL | Status: DC
  Administered 2014-05-28 – 2014-05-30 (×4): 400 mg via ORAL
  Filled 2014-05-27 (×7): qty 10

## 2014-05-27 MED ORDER — PIPERACILLIN-TAZOBACTAM 3.375 G IVPB
3.3750 g | Freq: Three times a day (TID) | INTRAVENOUS | Status: DC
  Administered 2014-05-28: 3.375 g via INTRAVENOUS
  Filled 2014-05-27 (×2): qty 50

## 2014-05-27 MED ORDER — SODIUM CHLORIDE 0.9 % IV BOLUS (SEPSIS)
1000.0000 mL | Freq: Once | INTRAVENOUS | Status: AC
  Administered 2014-05-27: 1000 mL via INTRAVENOUS

## 2014-05-27 MED ORDER — ADULT MULTIVITAMIN W/MINERALS CH
1.0000 | ORAL_TABLET | Freq: Every day | ORAL | Status: DC
  Administered 2014-05-29 – 2014-05-30 (×2): 1 via ORAL
  Filled 2014-05-27 (×3): qty 1

## 2014-05-27 MED ORDER — JEVITY 1.2 CAL PO LIQD
65.0000 mL/h | ORAL | Status: DC
  Administered 2014-05-28: 10 mL/h
  Administered 2014-05-28: 30 mL/h
  Administered 2014-05-29 (×2): 65 mL/h
  Filled 2014-05-27 (×27): qty 237

## 2014-05-27 MED ORDER — SODIUM CHLORIDE 0.9 % IJ SOLN
3.0000 mL | Freq: Two times a day (BID) | INTRAMUSCULAR | Status: DC
  Administered 2014-05-27 – 2014-05-29 (×4): 3 mL via INTRAVENOUS

## 2014-05-27 MED ORDER — GUAIFENESIN ER 600 MG PO TB12
600.0000 mg | ORAL_TABLET | Freq: Two times a day (BID) | ORAL | Status: DC
  Administered 2014-05-28 – 2014-05-30 (×3): 600 mg via ORAL
  Filled 2014-05-27 (×7): qty 1

## 2014-05-27 MED ORDER — HEPARIN SODIUM (PORCINE) 5000 UNIT/ML IJ SOLN
5000.0000 [IU] | Freq: Three times a day (TID) | INTRAMUSCULAR | Status: DC
  Administered 2014-05-27 – 2014-05-30 (×8): 5000 [IU] via SUBCUTANEOUS
  Filled 2014-05-27 (×11): qty 1

## 2014-05-27 MED ORDER — ASPIRIN EC 81 MG PO TBEC
81.0000 mg | DELAYED_RELEASE_TABLET | Freq: Every day | ORAL | Status: DC
Start: 2014-05-28 — End: 2014-05-30
  Administered 2014-05-29 – 2014-05-30 (×2): 81 mg via ORAL
  Filled 2014-05-27 (×3): qty 1

## 2014-05-27 MED ORDER — LEVETIRACETAM 100 MG/ML PO SOLN
500.0000 mg | Freq: Two times a day (BID) | ORAL | Status: DC
  Filled 2014-05-27: qty 5

## 2014-05-27 MED ORDER — VITAMIN B-1 100 MG PO TABS
100.0000 mg | ORAL_TABLET | Freq: Every day | ORAL | Status: DC
  Administered 2014-05-29 – 2014-05-30 (×2): 100 mg via ORAL
  Filled 2014-05-27 (×3): qty 1

## 2014-05-27 MED ORDER — LORAZEPAM 2 MG/ML IJ SOLN: 1.0000 mg | Freq: Four times a day (QID) | INTRAMUSCULAR | Status: DC | PRN

## 2014-05-27 MED ORDER — OXYCODONE HCL 5 MG/5ML PO SOLN
5.0000 mg | ORAL | Status: DC | PRN
  Administered 2014-05-29 – 2014-05-30 (×5): 5 mg via ORAL
  Filled 2014-05-27 (×5): qty 5

## 2014-05-27 MED ORDER — FENTANYL 25 MCG/HR TD PT72
25.0000 ug | MEDICATED_PATCH | TRANSDERMAL | Status: DC
  Administered 2014-05-27: 25 ug via TRANSDERMAL
  Filled 2014-05-27: qty 1

## 2014-05-27 MED ORDER — VANCOMYCIN HCL IN DEXTROSE 750-5 MG/150ML-% IV SOLN
750.0000 mg | Freq: Two times a day (BID) | INTRAVENOUS | Status: DC
  Administered 2014-05-28: 750 mg via INTRAVENOUS
  Filled 2014-05-27 (×2): qty 150

## 2014-05-27 MED ORDER — VANCOMYCIN HCL IN DEXTROSE 1-5 GM/200ML-% IV SOLN
1000.0000 mg | Freq: Once | INTRAVENOUS | Status: AC
Start: 2014-05-27 — End: 2014-05-27
  Administered 2014-05-27: 1000 mg via INTRAVENOUS
  Filled 2014-05-27: qty 200

## 2014-05-27 MED ORDER — FOLIC ACID 1 MG PO TABS
1.0000 mg | ORAL_TABLET | Freq: Every day | ORAL | Status: DC
  Administered 2014-05-29 – 2014-05-30 (×2): 1 mg via ORAL
  Filled 2014-05-27 (×3): qty 1

## 2014-05-27 MED ORDER — SODIUM CHLORIDE 0.9 % IV SOLN
INTRAVENOUS | Status: DC
  Administered 2014-05-27 – 2014-05-29 (×4): via INTRAVENOUS

## 2014-05-27 MED ORDER — LEVOTHYROXINE SODIUM 200 MCG PO TABS
200.0000 ug | ORAL_TABLET | Freq: Every day | ORAL | Status: DC
  Administered 2014-05-29: 200 ug via ORAL
  Filled 2014-05-27 (×3): qty 1

## 2014-05-27 MED ORDER — LORAZEPAM 0.5 MG PO TABS: 1.0000 mg | ORAL_TABLET | Freq: Four times a day (QID) | ORAL | Status: DC | PRN

## 2014-05-27 MED ORDER — THIAMINE HCL 100 MG/ML IJ SOLN
100.0000 mg | Freq: Every day | INTRAMUSCULAR | Status: DC
  Administered 2014-05-28: 100 mg via INTRAVENOUS
  Filled 2014-05-27 (×3): qty 1

## 2014-05-27 NOTE — ED Notes (Signed)
[name] daughter states the patient had seizures x 3 today between 1400 and 1445 today. Patient has a stoma and just mouths his words.  Patient is alert and oriented. No injuries noted. The daughter states the patient fell forward and landed on the bed.

## 2014-05-27 NOTE — ED Notes (Signed)
Phlebotomy called in reference to obtaining blood cultures

## 2014-05-27 NOTE — ED Notes (Signed)
[name] (daughter): [phone]

## 2014-05-27 NOTE — ED Notes (Signed)
Antibiotic paused, due to needing blood cultures.

## 2014-05-27 NOTE — H&P (Signed)
[facility] Admission History and Physical Service Pager: [phone]  Patient name: [name] Medical record number: [id number] Date of birth: 02/17/1951 Age: 64 y.o. Gender: male  Primary Care Provider: Howard Pouch, DO Consultants: Neuro Code Status: Full  Chief Complaint: Seizure  Assessment and Plan: [name] is a 64 y.o. male presenting with a witnessed seizure. PMH is significant for SCC of larynx/pharynx, systolic CHF, HTN, Malnutrition (PEG tube in place), Cirrhosis, Alcohol dependence, chronic pancreatitis, hypothyroidism, HLD.  #Seizure: Seizure was witnessed by daughter. States that tonic/clonic shaking occurred for a few minutes. No postictal state. No confusion present on admission. Patient states good compliance with all medications. Patient is on Keppra 500 mg twice a day. Patient states that his last seizure was 2-3 weeks ago. Neuro has not been contacted this time. Lactic Acid 3.65 on admission. - Admit to SDU for close monitoring given concern for possible sepsis; attending physician Dr. Lindell Noe - Consult neurology in a.m. - Continue home Keppra - Telemetry - IVF NS $Remo'@125ml'mrwWq$ /hr - F/u AM CBC, CMP, Lactic Acid  #Rule out Sepsis - Pt. hypotensive, tachypneic, fever of 100.9, lactic acid elevated.  WBC 7.0, not tachycardic. UA negative. Chest x-ray clear. PEG-tube appears benign. No focal physical exam findings, and pt. In no distress. Monitoring closely. Meets sepsis criteria based on recorded temp and tachycardia on admission, but now resolved. No signs / symptoms suggesting CNS infection. - Trending vitals >> patient now afebrile after initial rectal temp without administration of antipyretic.  - Trend lactic acid and obtained blood cultures. - Patient empirically started on vancomycin and Zosyn in the ED. We'll continue this treatment overnight and reassess in the morning and follow up cultures collected in the ED - IVF  #SCC of  Larynx / Pharynx - Followed by [facility] ENT. Patient underwent radiation therapy today (first time). G-tube in place. At this time he is a full code >> has met w/ palliative care on previous admissions. - monitor - Nutrition consult due to malnutrition and PEG placement. - Contact OP treatment teams if hospitalization interferes with outpatient appointments.  #Systolic CHF: echo 03/11/8307 shows EF 50-55%, focal basal hypertrophy of septum, and grade 1 LV diastolic dysfunction (previously moderate LVH, EF 45-50%, w/ grade 1 diastolic dysfunction in 4076) - Monitoring fluid status closely  - Strict I/O's.   #HTN - hypotensive initially in ED. Now hypertensive to 160/90.   - Continue home Bisoprolol $RemoveBeforeD'10mg'CbtTkJmlxQmDUD$  - monitor vitals closely given concern for sepsis  #Protein/Calorie Malnutrition - Noted on previous admission. Albumin of 3.0. G-tube in place.  - Continue meal supplements here.  - nutrition consultation.   #Cirrhosis of liver: LFTs WNL, liver not palpably enlarged. Albumin 3.0  #Alcohol dependence, History of. - CIWA here - Thiamine supplementation - MCV wnl  #Hypothyroidism:   - continue home synthroid 212mcg  #Hyperlipidemia - DCd Atorvastatin at last admission - on ASA   FEN/GI:  NS $R'@125ml'dt$ /hr; Tube feeding Prophylaxis: SQ Hep  Disposition: pending improvement.  History of Present Illness: [name] is a 64 y.o. male presenting with witnessed seizure prior to presentation to the ED. He had his first radiation treatment for [facility] of the larynx / pharyx at [facility], today, but states he felt "fine" there and immediately after. Patient was brought into the ED after sustaining a seizure which was witnessed by his daughter. Patient states that he believes he was unconscious for a few minutes in length but denied any pre-ictal sensations/symptoms or  any postictal period. Patient denies any other symptoms at this time. No headaches, confusion, vision changes, dizziness, chest  pain, shortness of breath, cough, nausea, vomiting, diarrhea, fevers, chills, diaphoresis, or changes in sensation. Patient does state that he has experienced significant weight loss recently. He believes that he has lost approximately 50 pounds over the course of one month. However, our records show this weight loss is occurred over the past 2 years.  History was significantly limited due to patient's inability to vocally communicate. Patient has a laryngectomy with stoma in place. History augmented by chart review and information from nursing and ED notes.  In the ED patient's vitals were worrisome and metastases Sirs criteria due to fever of 100.9 and hypotension. Patient was started on vancomycin and Zosyn in the ED for broad antibiotic coverage. UA was benign. Urine culture and blood cultures were collected. Troponin negative 1. I-STAT lactic acid elevated to 3.65. Patient was admitted under the care of family medicine teaching service.  Review Of Systems: Per HPI Otherwise 12 point review of systems was performed and was unremarkable.  Patient Active Problem List   Diagnosis Date Noted  . Sepsis 05/27/2014  . Seizures 05/06/2014  . SIRS (systemic inflammatory response syndrome)   . Fever   . AKI (acute kidney injury)   . Throat cancer   . Esophageal mass   . Weight loss   . Odynophagia   . Palliative care encounter   . Severe malnutrition 04/10/2014  . Protein-calorie malnutrition, severe 04/10/2014  . Syncope 04/09/2014  . LOC (loss of consciousness) 04/09/2014  . Anorexia   . Pancreatitis, chronic 05/20/2013  . Adrenal insufficiency, primary, iatrogenic 05/20/2013  . Hyperlipidemia   . Osteoarthritis   . Hypotension 05/10/2013  . Dehydration 05/08/2013  . Loss of weight 03/12/2013  . Elevated serum creatinine 11/28/2012  . Chronic systolic heart failure 09/32/6712  . Alcohol dependence 01/04/2012  . Decreased appetite 01/03/2012  . Acute and chronic respiratory failure  12/26/2011  . Acute systolic HF (heart failure) 12/26/2011  . 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 Hypothyroidism 10/16/2010  . Malignant neoplasm of larynx 01/10/2010  . PULMONARY NODULE 12/31/2007  . Essential hypertension, benign 11/08/2007   Past Medical History: Past Medical History  Diagnosis Date  . Hypertension   . Cirrhosis of liver   . Hypothyroidism   . Alcohol abuse   . CHF (congestive heart failure)     EF 45-50% 05/2012  . Hyperlipidemia   . Arthritis   . Laryngeal cancer 2011    Laryngectomy, T3N0M0  . Seizures    Past Surgical History: Past Surgical History  Procedure Laterality Date  . Tracheal surgery    . Tracheostomy    . Left and right heart catheterization with coronary angiogram N/A 12/28/2011    Procedure: LEFT AND RIGHT HEART CATHETERIZATION WITH CORONARY ANGIOGRAM;  Surgeon: Jolaine Artist, MD;  Location: [facility] CATH LAB;  Service: Cardiovascular;  Laterality: N/A;  . Esophagogastroduodenoscopy N/A 04/10/2014    Procedure: ESOPHAGOGASTRODUODENOSCOPY (EGD);  Surgeon: Missy Sabins, MD;  Location: [facility] ENDOSCOPY;  Service: Endoscopy;  Laterality: N/A;   Social History: History  Substance Use Topics  . Smoking status: Former Smoker -- 0.20 packs/day for 50 years    Types: Cigarettes  . Smokeless tobacco: Never Used  . Alcohol Use: Yes     Comment: "rarely"   Additional social history: none  Please also refer to relevant sections of EMR.  Family History: History  reviewed. No pertinent family history. Allergies and Medications: Allergies  Allergen Reactions  . Vicodin [Hydrocodone-Acetaminophen] Rash  . Other     Unknown anesthesia medicine caused cardiac arrest.  . Tylenol [Acetaminophen]     HBP -Liver Cirrhosis   No current facility-administered medications on file prior to encounter.   Current Outpatient Prescriptions on File Prior to Encounter  Medication Sig Dispense Refill  . Alum &  Mag Hydroxide-Simeth (MAGIC MOUTHWASH W/LIDOCAINE) SOLN Take 5 mLs by mouth 4 (four) times daily. 120 mL 0  . aspirin EC 81 MG tablet Take 81 mg by mouth daily.    . bisoprolol (ZEBETA) 10 MG tablet Take 1 tablet (10 mg total) by mouth daily. 30 tablet 4  . diclofenac sodium (VOLTAREN) 1 % GEL Apply 2 g topically 4 (four) times daily as needed (pain). 100 g 6  . fentaNYL (DURAGESIC - DOSED MCG/HR) 25 MCG/HR patch Place 1 patch (25 mcg total) onto the skin every 3 (three) days. 5 patch 0  . guaiFENesin (MUCINEX) 600 MG 12 hr tablet Take 1 tablet (600 mg total) by mouth 2 (two) times daily. 120 tablet 3  . levETIRAcetam (KEPPRA) 100 MG/ML solution Place 5 mLs (500 mg total) into feeding tube 2 (two) times daily. 473 mL 12  . levothyroxine (SYNTHROID, LEVOTHROID) 200 MCG tablet Take 1 tablet (200 mcg total) by mouth daily before breakfast. 30 tablet 0  . megestrol (MEGACE) 40 MG/ML suspension Take 10 mLs (400 mg total) by mouth 2 (two) times daily. 240 mL 2  . Nutritional Supplements (FEEDING SUPPLEMENT, JEVITY 1.2 CAL,) LIQD Place 65 mL/hr into feeding tube continuous. 1000 mL 0  . ondansetron (ZOFRAN) 4 MG tablet Take 1 tablet (4 mg total) by mouth every 8 (eight) hours as needed for nausea or vomiting. 60 tablet 0  . oxyCODONE (ROXICODONE) 5 MG/5ML solution Take 5 mLs (5 mg total) by mouth every 4 (four) hours as needed for severe pain. 90 mL 0  . Water For Irrigation, Sterile (FREE WATER) SOLN Place 175 mLs into feeding tube 4 (four) times daily. 1000 mL 0    Objective: BP 166/97 mmHg  Pulse 110  Temp(Src) 98.5 F (36.9 C) (Oral)  Resp 23  Ht $R'5\' 11"'Gx$  (1.803 m)  Wt 109 lb 2 oz (49.5 kg)  BMI 15.23 kg/m2  SpO2 100%  Latest recorded vitals here not indicative of condition at time of interview / exam for this note. BP 150's / 80's, pulse 92, respirations 18 Exam: General -- oriented, alert, and cooperative. Unable to speak 2/2 laryngectomy but communicates by mouthing words HEENT -- Head is  normocephalic. PERRLA. EOMI. Ears, nose and throat were benign. No teeth Neck -- supple; surgical scarring and laryngeal stoma present. Integument -- intact. No rash, erythema, or ecchymoses.  Chest -- good expansion. Lungs clear to auscultation. Cardiac -- RRR. No murmurs noted.  Abdomen -- soft, nontender. No masses palpable. Bowel sounds present. PEG tube in place. CNS -- cranial nerves II through XII grossly intact. Extremeties - no tenderness or effusions noted. Dorsalis pedis pulses present and symmetrical.    Labs and Imaging: CBC BMET   Recent Labs Lab 05/27/14 1646  WBC 7.0  HGB 11.2*  HCT 34.2*  PLT 227    Recent Labs Lab 05/27/14 1822  NA 134*  K 3.9  CL 105  CO2 21  BUN 24*  CREATININE 0.82  GLUCOSE 96  CALCIUM 7.9*     Lactic Acid; 3.65 Troponin; neg x1 UA; benign  Urine Culture; pending Blood Culture; pending   Elberta Leatherwood, MD 05/27/2014, 10:57 PM PGY-1, [place] Intern pager: [phone], text pages welcome  FPTS Upper-Level Resident Addendum  I have independently interviewed and examined the patient. I have discussed the above with Dr. Alease Frame and agree with his documentation as above. The above reflects his original note with my edits for correction/additions/clarification in orange. Please see also any attending notes.   Emmaline Kluver, MD PGY-3, [place] Service pager: [phone] (text pages welcome through [facility])

## 2014-05-27 NOTE — Progress Notes (Signed)
ANTIBIOTIC CONSULT NOTE - INITIAL  Pharmacy Consult for Vancomycin, Zosyn Indication: Sepsis  Allergies  Allergen Reactions  . Vicodin [Hydrocodone-Acetaminophen] Rash  . Other     Unknown anesthesia medicine caused cardiac arrest.  . Tylenol [Acetaminophen]     HBP -Liver Cirrhosis    Patient Measurements: Weight: 112 lb (50.803 kg) Height: 5'11"  Vital Signs: Temp: 100.9 F (38.3 C) (03/23 1646) Temp Source: Rectal (03/23 1646) BP: 158/102 mmHg (03/23 1804) Pulse Rate: 123 (03/23 1804) Intake/Output from previous day:   Intake/Output from this shift:    Labs:  Recent Labs  05/27/14 1646  WBC 7.0  HGB 11.2*  PLT 227   Estimated Creatinine Clearance: 61.9 mL/min (by C-G formula based on Cr of 0.9). No results for input(s): VANCOTROUGH, VANCOPEAK, VANCORANDOM, GENTTROUGH, GENTPEAK, GENTRANDOM, TOBRATROUGH, TOBRAPEAK, TOBRARND, AMIKACINPEAK, AMIKACINTROU, AMIKACIN in the last 72 hours.   Microbiology: Recent Results (from the past 720 hour(s))  MRSA PCR Screening     Status: None   Collection Time: 05/04/14  9:55 PM  Result Value Ref Range Status   MRSA by PCR NEGATIVE NEGATIVE Final    Comment:        The GeneXpert MRSA Assay (FDA approved for NASAL specimens only), is one component of a comprehensive MRSA colonization surveillance program. It is not intended to diagnose MRSA infection nor to guide or monitor treatment for MRSA infections.   Culture, blood (routine x 2)     Status: None   Collection Time: 05/05/14  1:39 AM  Result Value Ref Range Status   Specimen Description BLOOD RIGHT HAND  Final   Special Requests BOTTLES DRAWN AEROBIC ONLY 1CC  Final   Culture   Final    NO GROWTH 5 DAYS Note: Culture results may be compromised due to an inadequate volume of blood received in culture bottles. Performed at [facility]    Report Status 05/11/2014 FINAL  Final  Culture, blood (routine x 2)     Status: None   Collection Time: 05/05/14   2:43 AM  Result Value Ref Range Status   Specimen Description BLOOD LEFT HAND  Final   Special Requests BOTTLES DRAWN AEROBIC ONLY 2CC  Final   Culture   Final    NO GROWTH 5 DAYS Note: Culture results may be compromised due to an inadequate volume of blood received in culture bottles. Performed at [facility]    Report Status 05/11/2014 FINAL  Final    Medical History: Past Medical History  Diagnosis Date  . Hypertension   . Cirrhosis of liver   . Hypothyroidism   . Alcohol abuse   . CHF (congestive heart failure)     EF 45-50% 05/2012  . Hyperlipidemia   . Arthritis   . Laryngeal cancer 2011    Laryngectomy, T3N0M0  . Seizures     Assessment: 81 y/oM with PMH of throat cancer with first radiation treatment today, HTN, cirrhosis of liver, hypothyroidism, alcohol abuse, CHF, seizures, HLD who presents with CC of 3 tonic-clonic seizures prior to arrival. Patient found to be hypotensive in the ED. Sepsis protocol initiated in ED, and pharmacy consulted to assist with dosing of Vancomycin and Zosyn for possible sepsis.   3/23 >> Vancomycin >> 3/23 >> Zosyn >>    Tmax: 100.53F WBCs: WNL Renal: SCr 0.82, CrCl ~ 68 mL/min CG  3/23 blood x 2: sent 3/23 urine: sent   Goal of Therapy:  Vancomycin trough level 15-20 mcg/ml  Appropriate antibiotic dosing for  renal function and indication Eradication of infection  Plan:   Vancomycin 1g IV x 1 ordered in ED. Continue with maintenance dose of 750mg  IV q12h.  Plan for Vancomycin trough level at steady state.  Zosyn 3.375g IV x 1 over 30 min given in ED. Continue with Zosyn 3.375g IV q8h (infuse over 4 hours).  Monitor renal function, cultures, clinical course.   Lindell Spar, PharmD, BCPS Pager: [phone] 05/27/2014 7:18 PM

## 2014-05-27 NOTE — ED Provider Notes (Signed)
CSN: [id number]     Arrival date & time 05/27/14  1532 History   First MD Initiated Contact with Patient 05/27/14 1623     Chief Complaint  Patient presents with  . Seizures  . Hypotension     (Consider location/radiation/quality/duration/timing/severity/associated sxs/prior Treatment) HPI Comments: Patient here come planing of seizure activity prior to arrival. Has a history of throat cancer and had his first treatment with radiation today. He is not on chemotherapy. No recent fever, vomiting, diarrhea. His daughter states that he had 3 brief tonic-clonic seizures. Does have a history of seizure disorder and takes Keppra which is been compliant. 3 weeks ago had brain imaging which was negative for metastasis. Patient is back to his baseline at this time. No reported history of fevers. No treatment used prior to arrival  Patient is a 64 y.o. male presenting with seizures. The history is provided by the patient and a relative.  Seizures   Past Medical History  Diagnosis Date  . Hypertension   . Cirrhosis of liver   . Hypothyroidism   . Alcohol abuse   . CHF (congestive heart failure)     EF 45-50% 05/2012  . Hyperlipidemia   . Arthritis   . Laryngeal cancer 2011    Laryngectomy, T3N0M0  . Seizures    Past Surgical History  Procedure Laterality Date  . Tracheal surgery    . Tracheostomy    . Left and right heart catheterization with coronary angiogram N/A 12/28/2011    Procedure: LEFT AND RIGHT HEART CATHETERIZATION WITH CORONARY ANGIOGRAM;  Surgeon: Jolaine Artist, MD;  Location: [facility] CATH LAB;  Service: Cardiovascular;  Laterality: N/A;  . Esophagogastroduodenoscopy N/A 04/10/2014    Procedure: ESOPHAGOGASTRODUODENOSCOPY (EGD);  Surgeon: Missy Sabins, MD;  Location: [facility] ENDOSCOPY;  Service: Endoscopy;  Laterality: N/A;   History reviewed. No pertinent family history. History  Substance Use Topics  . Smoking status: Former Smoker -- 0.20 packs/day for 50 years    Types:  Cigarettes  . Smokeless tobacco: Never Used  . Alcohol Use: Yes     Comment: "rarely"    Review of Systems  Neurological: Positive for seizures.  All other systems reviewed and are negative.     Allergies  Vicodin; Other; and Tylenol  Home Medications   Prior to Admission medications   Medication Sig Start Date End Date Taking? Authorizing Provider  Alum & Mag Hydroxide-Simeth (MAGIC MOUTHWASH W/LIDOCAINE) SOLN Take 5 mLs by mouth 4 (four) times daily. 05/07/14   Frazier Richards, MD  aspirin EC 81 MG tablet Take 81 mg by mouth daily. 03/12/14   Historical Provider, MD  bisoprolol (ZEBETA) 10 MG tablet Take 1 tablet (10 mg total) by mouth daily. 05/26/14   Renee A Kuneff, DO  diclofenac sodium (VOLTAREN) 1 % GEL Apply 2 g topically 4 (four) times daily as needed (pain). 05/26/14   Renee A Kuneff, DO  fentaNYL (DURAGESIC - DOSED MCG/HR) 25 MCG/HR patch Place 1 patch (25 mcg total) onto the skin every 3 (three) days. 05/07/14   Frazier Richards, MD  guaiFENesin (MUCINEX) 600 MG 12 hr tablet Take 1 tablet (600 mg total) by mouth 2 (two) times daily. 05/26/14   Renee A Kuneff, DO  levETIRAcetam (KEPPRA) 100 MG/ML solution Place 5 mLs (500 mg total) into feeding tube 2 (two) times daily. 05/07/14   Frazier Richards, MD  levothyroxine (SYNTHROID, LEVOTHROID) 200 MCG tablet Take 1 tablet (200 mcg total) by mouth daily before breakfast. 04/14/14  Janora Norlander, DO  megestrol (MEGACE) 40 MG/ML suspension Take 10 mLs (400 mg total) by mouth 2 (two) times daily. 05/26/14   Renee A Kuneff, DO  Nutritional Supplements (FEEDING SUPPLEMENT, JEVITY 1.2 CAL,) LIQD Place 65 mL/hr into feeding tube continuous. 05/07/14   Frazier Richards, MD  ondansetron (ZOFRAN) 4 MG tablet Take 1 tablet (4 mg total) by mouth every 8 (eight) hours as needed for nausea or vomiting. 05/14/13   Leone Brand, MD  oxyCODONE (ROXICODONE) 5 MG/5ML solution Take 5 mLs (5 mg total) by mouth every 4 (four) hours as needed for severe pain. 05/07/14    Frazier Richards, MD  Water For Irrigation, Sterile (FREE WATER) SOLN Place 175 mLs into feeding tube 4 (four) times daily. 05/07/14   Frazier Richards, MD   BP 138/92 mmHg  Temp(Src) 99 F (37.2 C) (Oral)  Resp 18 Physical Exam  Constitutional: He is oriented to person, place, and time. He appears cachectic.  Non-toxic appearance. He has a sickly appearance. He appears ill. No distress.  HENT:  Head: Normocephalic and atraumatic.  Tracheostomy site intact without signs of infection  Eyes: Conjunctivae, EOM and lids are normal. Pupils are equal, round, and reactive to light.  Neck: Normal range of motion. Neck supple. No tracheal deviation present. No thyroid mass present.  Cardiovascular: Regular rhythm and normal heart sounds.  Tachycardia present.  Exam reveals no gallop.   No murmur heard. Pulmonary/Chest: Effort normal and breath sounds normal. No stridor. No respiratory distress. He has no decreased breath sounds. He has no wheezes. He has no rhonchi. He has no rales.  Abdominal: Soft. Normal appearance and bowel sounds are normal. He exhibits no distension. There is no tenderness. There is no rebound and no CVA tenderness.  Musculoskeletal: Normal range of motion. He exhibits no edema or tenderness.  Neurological: He is alert and oriented to person, place, and time. He has normal strength. No cranial nerve deficit or sensory deficit. GCS eye subscore is 4. GCS verbal subscore is 5. GCS motor subscore is 6.  Skin: Skin is warm and dry. No abrasion and no rash noted.  Psychiatric: His affect is blunt. His speech is delayed. He is slowed.  Nursing note and vitals reviewed.   ED Course  Procedures (including critical care time) Labs Review Labs Reviewed  URINE CULTURE  TROPONIN I  CBC WITH DIFFERENTIAL/PLATELET  COMPREHENSIVE METABOLIC PANEL  URINALYSIS, ROUTINE W REFLEX MICROSCOPIC  CBG MONITORING, ED  I-STAT CG4 LACTIC ACID, ED    Imaging Review No results found.   EKG  Interpretation None      MDM   Final diagnoses:  Altered mental state    Patient called a level to sepsis and sepsis order set initiated. Given 30 mL/kg bolus of saline. Repeat blood pressure is stable. Suspect the first one was erroneous. Started on antibiotics for sepsis without a source. Spoke with family practice resident and patient will be admitted over at [facility] hospital    Lacretia Leigh, MD 05/27/14 1806

## 2014-05-27 NOTE — ED Notes (Signed)
Per Pt's daughter, Pt had radiation at [facility] this morning.  Reports Pt had 3 seizure-like episodes once they returned home.  Sts Pt was recently diagnosed w/ seizures.

## 2014-05-28 ENCOUNTER — Encounter (HOSPITAL_COMMUNITY): Payer: Self-pay | Admitting: Neurology

## 2014-05-28 DIAGNOSIS — R55 Syncope and collapse: Secondary | ICD-10-CM

## 2014-05-28 DIAGNOSIS — I10 Essential (primary) hypertension: Secondary | ICD-10-CM

## 2014-05-28 DIAGNOSIS — R4182 Altered mental status, unspecified: Secondary | ICD-10-CM | POA: Insufficient documentation

## 2014-05-28 DIAGNOSIS — C329 Malignant neoplasm of larynx, unspecified: Secondary | ICD-10-CM

## 2014-05-28 DIAGNOSIS — R404 Transient alteration of awareness: Secondary | ICD-10-CM

## 2014-05-28 LAB — COMPREHENSIVE METABOLIC PANEL
ALT: 28 U/L (ref 0–53)
ANION GAP: 5 (ref 5–15)
AST: 31 U/L (ref 0–37)
Albumin: 2.2 g/dL — ABNORMAL LOW (ref 3.5–5.2)
Alkaline Phosphatase: 107 U/L (ref 39–117)
BILIRUBIN TOTAL: 1.8 mg/dL — AB (ref 0.3–1.2)
BUN: 16 mg/dL (ref 6–23)
CALCIUM: 7.6 mg/dL — AB (ref 8.4–10.5)
CO2: 21 mmol/L (ref 19–32)
Chloride: 112 mmol/L (ref 96–112)
Creatinine, Ser: 0.77 mg/dL (ref 0.50–1.35)
GLUCOSE: 79 mg/dL (ref 70–99)
Potassium: 4.5 mmol/L (ref 3.5–5.1)
Sodium: 138 mmol/L (ref 135–145)
Total Protein: 6 g/dL (ref 6.0–8.3)

## 2014-05-28 LAB — RAPID URINE DRUG SCREEN, HOSP PERFORMED
Amphetamines: NOT DETECTED
Barbiturates: NOT DETECTED
Benzodiazepines: NOT DETECTED
Cocaine: NOT DETECTED
Opiates: NOT DETECTED
Tetrahydrocannabinol: NOT DETECTED

## 2014-05-28 LAB — CBC
HCT: 27.9 % — ABNORMAL LOW (ref 39.0–52.0)
HEMOGLOBIN: 9.1 g/dL — AB (ref 13.0–17.0)
MCH: 27.2 pg (ref 26.0–34.0)
MCHC: 32.6 g/dL (ref 30.0–36.0)
MCV: 83.5 fL (ref 78.0–100.0)
Platelets: 192 10*3/uL (ref 150–400)
RBC: 3.34 MIL/uL — ABNORMAL LOW (ref 4.22–5.81)
RDW: 18.5 % — ABNORMAL HIGH (ref 11.5–15.5)
WBC: 6.4 10*3/uL (ref 4.0–10.5)

## 2014-05-28 LAB — PHOSPHORUS: Phosphorus: 2.8 mg/dL (ref 2.3–4.6)

## 2014-05-28 LAB — URINE CULTURE
COLONY COUNT: NO GROWTH
Culture: NO GROWTH

## 2014-05-28 LAB — ETHANOL: Alcohol, Ethyl (B): <5 mg/dL (ref 0–9)

## 2014-05-28 LAB — LACTIC ACID, PLASMA: Lactic Acid, Venous: 0.8 mmol/L (ref 0.5–2.0)

## 2014-05-28 LAB — MAGNESIUM: Magnesium: 1.9 mg/dL (ref 1.5–2.5)

## 2014-05-28 MED ORDER — LEVETIRACETAM IN NACL 500 MG/100ML IV SOLN
500.0000 mg | Freq: Two times a day (BID) | INTRAVENOUS | Status: DC
  Administered 2014-05-28 – 2014-05-29 (×4): 500 mg via INTRAVENOUS
  Filled 2014-05-28 (×5): qty 100

## 2014-05-28 MED ORDER — FENTANYL CITRATE 0.05 MG/ML IJ SOLN
25.0000 ug | INTRAMUSCULAR | Status: DC | PRN
Start: 2014-05-28 — End: 2014-05-30
  Administered 2014-05-28 – 2014-05-30 (×5): 25 ug via INTRAVENOUS
  Filled 2014-05-28 (×5): qty 2

## 2014-05-28 NOTE — Progress Notes (Signed)
[facility]  Patient Status: Active (receiving services up to time of hospitalization)  [facility] is providing the following services: RN and MSW  If patient discharges after hours, please call [phone].   [name] 05/28/2014, 2:51 PM

## 2014-05-28 NOTE — Progress Notes (Signed)
Utilization review completed. Belma Dyches, RN, BSN. 

## 2014-05-28 NOTE — Progress Notes (Signed)
Family Medicine Teaching Service Daily Progress Note Intern Pager: [phone]  Patient name: [name] Medical record number: [id number] Date of birth: November 28, 1950 Age: 64 y.o. Gender: male  Primary Care Provider: Howard Pouch, DO Consultants: None Code Status: Full  Pt Overview and Major Events to Date:  3/23-3/24: Admitted with seizures and concern for sepsis. Stable.   Assessment and Plan: [name] is a 64 y.o. male presenting with a witnessed seizure. PMH is significant for SCC of larynx/pharynx, systolic CHF, HTN, Malnutrition (PEG tube in place), Cirrhosis, Alcohol dependence, chronic pancreatitis, hypothyroidism, HLD.  #Seizure vs. Syncope: Seizure was witnessed by daughter. States that tonic/clonic shaking occurred for a few minutes. No postictal state. No confusion present on admission. Patient states good compliance with all medications. Patient is on Keppra 500 mg twice a day. Patient states that his last seizure was 2-3 weeks ago. Lactic Acid 3.65 on admission. Recent MRI negative for metastatic disease. Similar presentation to last admission with extensive discussion with Neurology regarding seizure  Vs. Convulsive syncope. Thought to be most likely convulsive syncope, though pt. Started on Keppra at discharge from recent hospitalization as a precaution. Syncope most likely given further history today.  - Admit to SDU for close monitoring given concern for possible sepsis; attending physician Dr. Lindell Noe - Will discuss with neurology today.  - Continue home [place] for now.  - Telemetry - EKG today - Last Echo 04/10/2014 with G1DD.  - MG, Phos.  - IVF.  - No infectious source as a trigger at this point.  - AM CBC / BMP negative. Lactic acid now 0.8  #Rule out Sepsis - Pt. Initially hypotensive, tachypneic, fever of 100.9, lactic acid elevated. WBC 7.0, not tachycardic. UA negative. Chest x-ray clear. PEG-tube appears benign. No focal physical exam findings, and pt. In  no distress. VS stabilized. Labs stabilized. Lactic acid 0.8. No source. Unlikely to be sepsis at this point.   - Trending vitals >> Stable.   - Blood cx pending. Urine Cx pending.  - Lactic acid 0.8.  - Will discuss stopping broad spectrum antibiotics today.  - Patient empirically started on vancomycin and Zosyn in the ED continue for now.  - IVF  #SCC of Larynx / Pharynx - Followed by [facility]. Patient underwent radiation therapy today (first time). G-tube in place. At this time he is a full code >> has met w/ palliative care on previous admissions. - monitor - Nutrition consult due to malnutrition and PEG placement. - Getting PEG fittings from home.  - Contact OP treatment teams if hospitalization interferes with outpatient appointments. - Will discuss palliation with him again.   #Systolic CHF: echo 03/12/4942 shows EF 50-55%, focal basal hypertrophy of septum, and grade 1 LV diastolic dysfunction (previously moderate LVH, EF 45-50%, w/ grade 1 diastolic dysfunction in 9675) - Monitoring fluid status closely  - Strict I/O's.  - EKG with equivocal Leftward axis and LA enlargement / RA enlargement.   #HTN - hypotensive initially in ED. Now hypertensive to 160/90.  - Continue home Bisoprolol 7m - monitor vitals closely given concern for sepsis  #Protein/Calorie Malnutrition - Noted on previous admission. Albumin of 3.0. G-tube in place.  - Continue meal supplements here.  - nutrition consultation.   #Cirrhosis of liver: LFTs WNL, liver not palpably enlarged. Albumin 3.0  #Alcohol dependence, History of. - CIWA here - Thiamine supplementation - MCV wnl  #Hypothyroidism:  - continue home synthroid 2063m - Holding for now until able to use PEG  tube.   #Hyperlipidemia - DCd Atorvastatin at last admission - on ASA   FEN/GI: NS _0 /hr; Tube feeding Prophylaxis: SQ Hep  Disposition: Pending discussion with Neurology.   Subjective:  Discussed further with pt.  Today. He reports sitting in his chair at home, attempting to rise, and then falling back into his chair with generalized tremors. He says that his daughter did not report that his eyes were rolled back in his head, or that he had convulsions / rhythmic movements of any of his extremities. He says that he briefly lost consciousness. He denies feelings of prodrome either palpitations, dizziness, or faintness prior to standing up. He denies weakness or disorientation after the event. He reports good intake of fluids via PEG tube. He has been compliant with his Keppra at home. His daughter will be by later today for [place] to discuss further with her.   Objective: Temp:  [98.5 F (36.9 C)-100.9 F (38.3 C)] 98.9 F (37.2 C) (03/24 0413) Pulse Rate:  [88-123] 88 (03/24 0413) Resp:  [18-25] 24 (03/24 0413) BP: (69-168)/(51-115) 145/90 mmHg (03/24 0413) SpO2:  [99 %-100 %] 100 % (03/23 2056) Weight:  [109 lb 2 oz (49.5 kg)-112 lb (50.803 kg)] 109 lb 2 oz (49.5 kg) (03/23 2056) Physical Exam: General: NAD, AAOx3, sitting on the side of thebed.  Cardiovascular: Tachycardic with sitting up, RR, 2/6 systolic murmur RSB, No GR, Normal S1/S2. +DP Respiratory: CTA B, appropriate rate, unlabored.  Abdomen: S, NT, ND, +BS, PEG in place C/D/I Extremities: WWP, 2+ distal pulses, MAEW.  Neuro: AAOx3, No focal deficits this am.   Laboratory:  Recent Labs Lab 05/27/14 1646 05/28/14 0330  WBC 7.0 6.4  HGB 11.2* 9.1*  HCT 34.2* 27.9*  PLT 227 192    Recent Labs Lab 05/27/14 1822 05/28/14 0330  NA 134* 138  K 3.9 4.5  CL 105 112  CO2 21 21  BUN 24* 16  CREATININE 0.82 0.77  CALCIUM 7.9* 7.6*  PROT 7.3 6.0  BILITOT 1.1 1.8*  ALKPHOS 123* 107  ALT 35 28  AST 28 31  GLUCOSE 96 79   UDS - negative Alcohol - negative.   EKG 3/24 - NSR, No acute changes.   Lactic Acid; 3.65 > 0.8 Troponin; neg x1 UA; benign Urine Culture; pending Blood Culture; pending  Imaging/Diagnostic Tests: CXR  3/23:  IMPRESSION: Probable COPD. No active disease.  Aquilla Hacker, MD 05/28/2014, 8:13 AM PGY-1, [place] Intern pager: [phone], text pages welcome

## 2014-05-28 NOTE — Progress Notes (Signed)
INITIAL NUTRITION ASSESSMENT  DOCUMENTATION CODES Per approved criteria  -Severe malnutrition in the context of chronic illness -Underweight  Pt meets criteria for SEVERE MALNUTRITION in the context of CHRONIC ILLNESS as evidenced by severe muscle wasting and severe loss of subcutaneous fat mass.  INTERVENTION: Recommend continuing current TF regimen: Jevity 1.2 @ 65 ml/hr via PEG continuously  Tube feeding regimen provides 1872 kcal (100% of needs), 87 grams of protein, and 1264 ml of H2O.    NUTRITION DIAGNOSIS: Malnutrition related to laryngeal cancer as evidenced by severe wasting.   Goal: Pt to meet >/= 90% of their estimated nutrition needs   Monitor:  TF initiation/tolerance, weight trend, labs  Reason for Assessment: Consult for Severe PCM (On G-tube feeds) and Positive Malnutrition Screening Tool  64 y.o. male  Admitting Dx: Sepsis  ASSESSMENT: 64 y.o. male presenting with a witnessed seizure. PMH is significant for SCC of larynx/pharynx, systolic CHF, HTN, Malnutrition (PEG tube in place), Cirrhosis, Alcohol dependence, chronic pancreatitis, hypothyroidism, HLD.  Pt asleep at time of visit. Severe muscle wasting noted in legs and clavicles and severe fat wasting in orbital region and arms. No tube feedings running at time of visit. Weight history shows that patient 12 lbs in the past year but, weight has been stable since 2/8 (when G-tube was placed). Per H&P pt received Jevity 1.2 @ 65 ml/hr continuously PTA. This provides 1872 kcal, 87 grams of protein, and 1264 ml of water. 175 ml of water via feeding tube QID to provide 700 ml daily.   Labs: low calcium, low hemoglobin  Height: Ht Readings from Last 1 Encounters:  05/27/14 5\' 11"  (1.803 m)    Weight: Wt Readings from Last 1 Encounters:  05/27/14 109 lb 2 oz (49.5 kg)    Ideal Body Weight: 172 lbs  % Ideal Body Weight: 63%  Wt Readings from Last 10 Encounters:  05/27/14 109 lb 2 oz (49.5 kg)  05/26/14  112 lb 14.4 oz (51.211 kg)  05/10/14 106 lb (48.081 kg)  05/07/14 106 lb 14.8 oz (48.5 kg)  04/13/14 109 lb 14.4 oz (49.85 kg)  07/10/13 127 lb (57.607 kg)  06/10/13 121 lb (54.885 kg)  05/14/13 118 lb 13.3 oz (53.9 kg)  03/12/13 127 lb (57.607 kg)  02/07/13 134 lb (60.782 kg)    Usual Body Weight: unknown  % Usual Body Weight: NA  BMI:  Body mass index is 15.23 kg/(m^2).  Estimated Nutritional Needs: Kcal: 2947-6546 Protein: 80-95 grams Fluid: 1.8-2 L/day  Skin: intact  Diet Order:  NPO  EDUCATION NEEDS: -No education needs identified at this time   Intake/Output Summary (Last 24 hours) at 05/28/14 0952 Last data filed at 05/28/14 0646  Gross per 24 hour  Intake   1375 ml  Output   1300 ml  Net     75 ml    Last BM: 3/23  Labs:   Recent Labs Lab 05/27/14 1822 05/28/14 0330  NA 134* 138  K 3.9 4.5  CL 105 112  CO2 21 21  BUN 24* 16  CREATININE 0.82 0.77  CALCIUM 7.9* 7.6*  GLUCOSE 96 79    CBG (last 3)   Recent Labs  05/27/14 1623  GLUCAP 72    Scheduled Meds: . aspirin EC  81 mg Oral Daily  . bisoprolol  10 mg Oral Daily  . fentaNYL  25 mcg Transdermal Q72H  . folic acid  1 mg Oral Daily  . free water  175 mL Per Tube QID  .  guaiFENesin  600 mg Oral BID  . heparin  5,000 Units Subcutaneous 3 times per day  . levETIRAcetam  500 mg Intravenous BID  . levothyroxine  200 mcg Oral QAC breakfast  . magic mouthwash w/lidocaine  5 mL Oral QID  . megestrol  400 mg Oral BID  . multivitamin with minerals  1 tablet Oral Daily  . sodium chloride  3 mL Intravenous Q12H  . thiamine  100 mg Oral Daily   Or  . thiamine  100 mg Intravenous Daily    Continuous Infusions: . sodium chloride 125 mL/hr at 05/28/14 0549  . feeding supplement (JEVITY 1.2 CAL)      Past Medical History  Diagnosis Date  . Hypertension   . Cirrhosis of liver   . Hypothyroidism   . Alcohol abuse   . CHF (congestive heart failure)     EF 45-50% 05/2012  .  Hyperlipidemia   . Arthritis   . Laryngeal cancer 2011    Laryngectomy, T3N0M0  . Seizures     Past Surgical History  Procedure Laterality Date  . Tracheal surgery    . Tracheostomy    . Left and right heart catheterization with coronary angiogram N/A 12/28/2011    Procedure: LEFT AND RIGHT HEART CATHETERIZATION WITH CORONARY ANGIOGRAM;  Surgeon: Jolaine Artist, MD;  Location: [facility] CATH LAB;  Service: Cardiovascular;  Laterality: N/A;  . Esophagogastroduodenoscopy N/A 04/10/2014    Procedure: ESOPHAGOGASTRODUODENOSCOPY (EGD);  Surgeon: Missy Sabins, MD;  Location: [facility] ENDOSCOPY;  Service: Endoscopy;  Laterality: N/A;    Pryor Ochoa RD, LDN Inpatient Clinical Dietitian Pager: [phone] After Hours Pager: [phone]

## 2014-05-28 NOTE — Clinical Documentation Improvement (Signed)
Supporting Information: Patient with AMS per 3/23 progress notes.    Possible Clinical Condition: . Document the etiology of the altered mental status as: --Coma --Confusion/delirium (including drug-induced) --Drowsiness/somnolence --Stupor/semi-coma --Encephalopathy Alcoholic Anoxic/hypoxic Drug-induced/toxic (specify drug) Hepatic Hypertensive Hypoglycemic Metabolic/septic Traumatic/post-concussion Wernicke Other (specify) . Document any associated diagnoses/conditions     Thank Sherian Maroon Documentation Specialist [phone] [email or web address]

## 2014-05-28 NOTE — Consult Note (Signed)
NEURO HOSPITALIST CONSULT NOTE    Reason for Consult: Seizure versus Syncope  HPI:                                                                                                                                          [name] is an 64 y.o. male presenting to hospital after family noted patient have possible seizure versus syncope with myoclonic activity. After talking with the daughter, he had 3 episodes while at home.  All three episodes he had just stood up from a seated position then had a glazed look.  The first episode he fainted and fell onto the bed, shook both UE and LE for about 1 minute and came too shortly after.  The second episode he stood up, was trying to reach out to his daughter and shoo for a second which stopped after putting him into the chair.   The third episode was very much like the second.  He did not loose B/B.  Per not it was noted he was hypotensive on arrival to ED. Currently he is normotensive. He has been taking his Keppra as directed.   Past Medical History  Diagnosis Date  . Hypertension   . Cirrhosis of liver   . Hypothyroidism   . Alcohol abuse   . CHF (congestive heart failure)     EF 45-50% 05/2012  . Hyperlipidemia   . Arthritis   . Laryngeal cancer 2011    Laryngectomy, T3N0M0  . Seizures     Past Surgical History  Procedure Laterality Date  . Tracheal surgery    . Tracheostomy    . Left and right heart catheterization with coronary angiogram N/A 12/28/2011    Procedure: LEFT AND RIGHT HEART CATHETERIZATION WITH CORONARY ANGIOGRAM;  Surgeon: Jolaine Artist, MD;  Location: [facility] CATH LAB;  Service: Cardiovascular;  Laterality: N/A;  . Esophagogastroduodenoscopy N/A 04/10/2014    Procedure: ESOPHAGOGASTRODUODENOSCOPY (EGD);  Surgeon: Missy Sabins, MD;  Location: [facility] ENDOSCOPY;  Service: Endoscopy;  Laterality: N/A;    Family History  Problem Relation Age of Onset  . Hyperlipidemia Mother   . Hypertension Mother    . Hypertension Father   . Hyperlipidemia Father      Social History:  reports that he has quit smoking. His smoking use included Cigarettes. He has a 10 pack-year smoking history. He has never used smokeless tobacco. He reports that he drinks alcohol. He reports that he does not use illicit drugs.  Allergies  Allergen Reactions  . Vicodin [Hydrocodone-Acetaminophen] Rash  . Other     Unknown anesthesia medicine caused cardiac arrest.  . Tylenol [Acetaminophen]     HBP -Liver Cirrhosis    MEDICATIONS:  Prior to Admission:  Prescriptions prior to admission  Medication Sig Dispense Refill Last Dose  . Alum & Mag Hydroxide-Simeth (MAGIC MOUTHWASH W/LIDOCAINE) SOLN Take 5 mLs by mouth 4 (four) times daily. 120 mL 0 05/27/2014 at Unknown time  . aspirin EC 81 MG tablet Take 81 mg by mouth daily.   05/27/2014 at Unknown time  . fentaNYL (DURAGESIC - DOSED MCG/HR) 25 MCG/HR patch Place 1 patch (25 mcg total) onto the skin every 3 (three) days. 5 patch 0 05/27/2014 at Unknown time  . guaiFENesin (MUCINEX) 600 MG 12 hr tablet Take 1 tablet (600 mg total) by mouth 2 (two) times daily. 120 tablet 3 Past Week at Unknown time  . levETIRAcetam (KEPPRA) 100 MG/ML solution Place 5 mLs (500 mg total) into feeding tube 2 (two) times daily. 473 mL 12 05/27/2014 at Unknown time  . levothyroxine (SYNTHROID, LEVOTHROID) 200 MCG tablet Take 1 tablet (200 mcg total) by mouth daily before breakfast. 30 tablet 0 05/27/2014 at Unknown time  . megestrol (MEGACE) 40 MG/ML suspension Take 10 mLs (400 mg total) by mouth 2 (two) times daily. 240 mL 2 05/27/2014 at Unknown time  . Nutritional Supplements (FEEDING SUPPLEMENT, JEVITY 1.2 CAL,) LIQD Place 65 mL/hr into feeding tube continuous. 1000 mL 0 05/27/2014 at Unknown time  . ondansetron (ZOFRAN) 4 MG tablet Take 1 tablet (4 mg total) by mouth every 8 (eight)  hours as needed for nausea or vomiting. 60 tablet 0 Past Month at Unknown time  . oxyCODONE (ROXICODONE) 5 MG/5ML solution Take 5 mLs (5 mg total) by mouth every 4 (four) hours as needed for severe pain. 90 mL 0 05/27/2014 at Unknown time  . Water For Irrigation, Sterile (FREE WATER) SOLN Place 175 mLs into feeding tube 4 (four) times daily. 1000 mL 0 05/27/2014 at Unknown time  . bisoprolol (ZEBETA) 10 MG tablet Take 1 tablet (10 mg total) by mouth daily. 30 tablet 4 05/25/2014  . diclofenac sodium (VOLTAREN) 1 % GEL Apply 2 g topically 4 (four) times daily as needed (pain). (Patient not taking: Reported on 05/28/2014) 100 g 6    Scheduled: . aspirin EC  81 mg Oral Daily  . bisoprolol  10 mg Oral Daily  . fentaNYL  25 mcg Transdermal Q72H  . folic acid  1 mg Oral Daily  . free water  175 mL Per Tube QID  . guaiFENesin  600 mg Oral BID  . heparin  5,000 Units Subcutaneous 3 times per day  . levETIRAcetam  500 mg Intravenous BID  . levothyroxine  200 mcg Oral QAC breakfast  . magic mouthwash w/lidocaine  5 mL Oral QID  . megestrol  400 mg Oral BID  . multivitamin with minerals  1 tablet Oral Daily  . sodium chloride  3 mL Intravenous Q12H  . thiamine  100 mg Oral Daily   Or  . thiamine  100 mg Intravenous Daily   Continuous: . sodium chloride 125 mL/hr at 05/28/14 0549  . feeding supplement (JEVITY 1.2 CAL)     CHY:IFOYDXAJOI sodium, fentaNYL, LORazepam **OR** LORazepam, ondansetron, oxyCODONE   ROS:  History obtained from the patient  General ROS: negative for - chills, fatigue, fever, night sweats, weight gain or weight loss Psychological ROS: negative for - behavioral disorder, hallucinations, memory difficulties, mood swings or suicidal ideation Ophthalmic ROS: negative for - blurry vision, double vision, eye pain or loss of vision ENT ROS: negative  for - epistaxis, nasal discharge, oral lesions, sore throat, tinnitus or vertigo Allergy and Immunology ROS: negative for - hives or itchy/watery eyes Hematological and Lymphatic ROS: negative for - bleeding problems, bruising or swollen lymph nodes Endocrine ROS: negative for - galactorrhea, hair pattern changes, polydipsia/polyuria or temperature intolerance Respiratory ROS: negative for - cough, hemoptysis, shortness of breath or wheezing Cardiovascular ROS: negative for - chest pain, dyspnea on exertion, edema or irregular heartbeat Gastrointestinal ROS: negative for - abdominal pain, diarrhea, hematemesis, nausea/vomiting or stool incontinence Genito-Urinary ROS: negative for - dysuria, hematuria, incontinence or urinary frequency/urgency Musculoskeletal ROS: negative for - joint swelling or muscular weakness Neurological ROS: as noted in HPI Dermatological ROS: negative for rash and skin lesion changes   Blood pressure 120/74, pulse 100, temperature 99.3 F (37.4 C), temperature source Oral, resp. rate 26, height 5\' 11"  (1.803 m), weight 49.5 kg (109 lb 2 oz), SpO2 100 %.   Neurologic Examination:                                                                                                      HEENT-  Normocephalic, no lesions, without obvious abnormality.  Normal external eye and conjunctiva.  Normal TM's bilaterally.  Normal auditory canals and external ears. Normal external nose, mucus membranes and septum.  Normal pharynx. Cardiovascular- S1, S2 normal, pulses palpable throughout   Lungs- chest clear, no wheezing, rales, normal symmetric air entry Abdomen- normal findings: bowel sounds normal Extremities- no edema Lymph-no adenopathy palpable Musculoskeletal-no joint tenderness, deformity or swelling Skin-warm and dry, no hyperpigmentation, vitiligo, or suspicious lesions  Neurological Examination Mental Status: Alert, oriented, thought content appropriate.  NO speech due to  laryngeal cancer.  Able to follow 3 step commands without difficulty. Cranial Nerves: II: Discs flat bilaterally; Visual fields grossly normal, pupils equal, round, reactive to light and accommodation III,IV, VI: ptosis not present, extra-ocular motions intact bilaterally V,VII: smile symmetric, facial light touch sensation normal bilaterally VIII: hearing normal bilaterally IX,X: uvula rises symmetrically XI: bilateral shoulder shrug XII: midline tongue extension Motor: Right : Upper extremity   5/5    Left:     Upper extremity   5/5  Lower extremity   5/5     Lower extremity   5/5 Tone and bulk:normal tone throughout; no atrophy noted Sensory: Pinprick and light touch intact throughout, bilaterally Deep Tendon Reflexes: 2+ and symmetric throughout Plantars: Right: downgoing   Left: downgoing Cerebellar: normal finger-to-nose, normal heel-to-shin test Gait: not tested due to safety.       Lab Results: Basic Metabolic Panel:  Recent Labs Lab 05/27/14 1822 05/28/14 0330 05/28/14 0655  NA 134* 138  --   K 3.9 4.5  --   CL 105 112  --   CO2  21 21  --   GLUCOSE 96 79  --   BUN 24* 16  --   CREATININE 0.82 0.77  --   CALCIUM 7.9* 7.6*  --   MG  --   --  1.9  PHOS  --   --  2.8    Liver Function Tests:  Recent Labs Lab 05/27/14 1822 05/28/14 0330  AST 28 31  ALT 35 28  ALKPHOS 123* 107  BILITOT 1.1 1.8*  PROT 7.3 6.0  ALBUMIN 3.0* 2.2*   No results for input(s): LIPASE, AMYLASE in the last 168 hours. No results for input(s): AMMONIA in the last 168 hours.  CBC:  Recent Labs Lab 05/27/14 1646 05/28/14 0330  WBC 7.0 6.4  NEUTROABS 4.0  --   HGB 11.2* 9.1*  HCT 34.2* 27.9*  MCV 83.8 83.5  PLT 227 192    Cardiac Enzymes:  Recent Labs Lab 05/27/14 1822  TROPONINI <0.03    Lipid Panel: No results for input(s): CHOL, TRIG, HDL, CHOLHDL, VLDL, LDLCALC in the last 168 hours.  CBG:  Recent Labs Lab 05/27/14 1623  GLUCAP 72     Microbiology: Results for orders placed or performed during the hospital encounter of 05/27/14  Culture, blood (routine x 2)     Status: None (Preliminary result)   Collection Time: 05/27/14  4:39 PM  Result Value Ref Range Status   Specimen Description BLOOD LEFT ANTECUBITAL  Final   Special Requests BOTTLES DRAWN AEROBIC ONLY 2CC  Final   Culture   Final           BLOOD CULTURE RECEIVED NO GROWTH TO DATE CULTURE WILL BE HELD FOR 5 DAYS BEFORE ISSUING A FINAL NEGATIVE REPORT Performed at [facility]    Report Status PENDING  Incomplete  Culture, blood (routine x 2)     Status: None (Preliminary result)   Collection Time: 05/27/14  5:35 PM  Result Value Ref Range Status   Specimen Description BLOOD LEFT ARM  Final   Special Requests BOTTLES DRAWN AEROBIC ONLY 3CC  Final   Culture   Final           BLOOD CULTURE RECEIVED NO GROWTH TO DATE CULTURE WILL BE HELD FOR 5 DAYS BEFORE ISSUING A FINAL NEGATIVE REPORT Performed at [facility]    Report Status PENDING  Incomplete    Coagulation Studies: No results for input(s): LABPROT, INR in the last 72 hours.  Imaging: Dg Chest Port 1 View  05/27/2014   CLINICAL DATA:  Seizure.  EXAM: PORTABLE CHEST - 1 VIEW  COMPARISON:  05/06/2014  FINDINGS: The heart size and mediastinal contours are within normal limits. Suggestion of underlying COPD. There is no evidence of pulmonary edema, consolidation, pneumothorax, nodule or pleural fluid. The visualized skeletal structures are unremarkable.  IMPRESSION: Probable COPD.  No active disease.   Electronically Signed   By: Aletta Edouard M.D.   On: 05/27/2014 17:02    Etta Quill PA-C [facility] [phone]  05/28/2014, 12:55 PM   Assessment/Plan: 64 YO male presenting to the hospital with hypotension and three events which he stood up and had syncope with convulsive activity and presyncopal symptoms. With description of events, episodes are more indicative of  syncope with convulsive activity rather than seizure. No clear postictal state has been described. Seizure disorder is unlikely. I would recommend discontinuing Keppra, as patient has no clear indication of having a seizure disorder and has continued to be symptomatic on Keppra. No further  neurodiagnostic studies are indicated at this point. Recommend continuing measures to minimize orthostasis, as well as consider outpatient allergy workup for possible autonomic neuropathy.  Rush Farmer M.D. [facility] [phone]

## 2014-05-29 DIAGNOSIS — R509 Fever, unspecified: Secondary | ICD-10-CM

## 2014-05-29 DIAGNOSIS — R4182 Altered mental status, unspecified: Secondary | ICD-10-CM

## 2014-05-29 LAB — TSH: TSH: 0.078 u[IU]/mL — ABNORMAL LOW (ref 0.350–4.500)

## 2014-05-29 MED ORDER — CHLORHEXIDINE GLUCONATE 0.12 % MT SOLN
15.0000 mL | Freq: Two times a day (BID) | OROMUCOSAL | Status: DC
  Filled 2014-05-29 (×4): qty 15

## 2014-05-29 MED ORDER — CETYLPYRIDINIUM CHLORIDE 0.05 % MT LIQD: 7.0000 mL | Freq: Two times a day (BID) | OROMUCOSAL | Status: DC

## 2014-05-29 MED ORDER — LEVOTHYROXINE SODIUM 175 MCG PO TABS
175.0000 ug | ORAL_TABLET | Freq: Every day | ORAL | Status: DC
  Administered 2014-05-30: 175 ug via ORAL
  Filled 2014-05-29 (×2): qty 1

## 2014-05-29 NOTE — Progress Notes (Signed)
Family Medicine Teaching Service Daily Progress Note Intern Pager: [phone]  Patient name: [name] Medical record number: [id number] Date of birth: Nov 03, 1950 Age: 64 y.o. Gender: male  Primary Care Provider: Howard Pouch, DO Consultants: None Code Status: Full  Pt Overview and Major Events to Date:  3/23-3/24: Admitted with seizures and concern for sepsis. Stable.   Assessment and Plan: [name] is a 64 y.o. male presenting with a witnessed seizure. PMH is significant for SCC of larynx/pharynx, systolic CHF, HTN, Malnutrition (PEG tube in place), Cirrhosis, Alcohol dependence, chronic pancreatitis, hypothyroidism, HLD.  Convulsive syncope:  - D/C keppra  #Rule out Sepsis Hypotension resolved, no SIRS.  - Vitals per routine - D/C abx - D/C IVF  #SCC of Larynx / Pharynx - Followed by [facility] ENT. G-tube in place. At this time he is a full code >> has met w/ palliative care on previous admissions. - Nutrition consult due to malnutrition and PEG placement. - Getting PEG fittings from home.  - Contact OP treatment teams if hospitalization interferes with outpatient appointments.  #Systolic CHF: echo 09/09/7207 shows EF 50-55%, focal basal hypertrophy of septum, and grade 1 LV diastolic dysfunction (previously moderate LVH, EF 45-50%, w/ grade 1 diastolic dysfunction in 4709) - Euvolemic - Strict I/O's.   #HTN Uncontrolled in hospital (above 160/90s) on outpatient regimen  - Continue home Bisoprolol 24m. No augmentation for fear of orthostatic hypotension.  #Protein/Calorie Malnutrition - Noted on previous admission. Albumin of 3.0. G-tube in place.  - Continue meal supplements here.  - nutrition consultation.   #Cirrhosis of liver: LFTs WNL, liver not palpably enlarged. Albumin 3.0  #Alcohol dependence, History of. - CIWA here - Thiamine supplementation - MCV wnl  #Hypothyroidism:Over-treated, will decrease synthroid. Could contribute to reports of  tachycardia by daughter.  - Synthroid 2076m > 17543m #Hyperlipidemia - DCd Atorvastatin at last admission - on ASA  FEN/GI:Tube feeding Prophylaxis: SQ Hep  Disposition: Pending PT evaluation  Subjective:  No events overnight. Pt without complaints. Daughter at bedside concerned for pt's safety at home.  Objective: Temp:  [98.3 F (36.8 C)-99.8 F (37.7 C)] 98.3 F (36.8 C) (03/25 1208) Pulse Rate:  [77-95] 77 (03/25 1208) Resp:  [20-24] 20 (03/25 1208) BP: (131-156)/(80-94) 134/80 mmHg (03/25 1208) SpO2:  [90 %-100 %] 90 % (03/25 1208) Physical Exam: Gen: Thin well-appearing 63 y.o.male in NAD HEENT: Muddy sclerae, conjunctivae normal, moist mucous membranes, posterior oropharynx clear Neck: Tracheostomy with surrounding pink pigmentation without erythema or drainage CV: Regular rate, no murmur; radial, DP and PT pulses 2+ bilaterally; no LE edema Pulm: Non-labored breathing ambient air; CTAB, no wheezes or crackles Skin: No breakdown, wounds, ulcers Neuro: Alert and oriented x4, nonverbal, diffusely diminished muscle bulk, no focal deficits in sensation or motor function, patellar DTRs 2+ bilaterally.  Laboratory:  Recent Labs Lab 05/27/14 1646 05/28/14 0330  WBC 7.0 6.4  HGB 11.2* 9.1*  HCT 34.2* 27.9*  PLT 227 192    Recent Labs Lab 05/27/14 1822 05/28/14 0330  NA 134* 138  K 3.9 4.5  CL 105 112  CO2 21 21  BUN 24* 16  CREATININE 0.82 0.77  CALCIUM 7.9* 7.6*  PROT 7.3 6.0  BILITOT 1.1 1.8*  ALKPHOS 123* 107  ALT 35 28  AST 28 31  GLUCOSE 96 79   UDS - negative Alcohol - negative.   EKG 3/24 - NSR, No acute changes.   Lactic Acid; 3.65 > 0.8 Troponin; neg x1 UA; benign Urine Culture;  pending Blood Culture; pending  Imaging/Diagnostic Tests: CXR 3/23:  IMPRESSION: Probable COPD. No active disease.  Nathaniel Pour, MD 05/29/2014, 1:33 PM PGY-2, [place] Intern pager: [phone], text pages welcome

## 2014-05-29 NOTE — Care Management Note (Signed)
    Page 1 of 1   05/29/2014     10:57:32 AM CARE MANAGEMENT NOTE 05/29/2014  Patient:  [name], [name]   Account Number:  [id number]  Date Initiated:  05/29/2014  Documentation initiated by:  Elissa Hefty  Subjective/Objective Assessment:   adm w seizure     Action/Plan:   lives w fam, act we ahc for rn and sw, pcp dr Joseph Art Janann August   Anticipated DC Date:     Anticipated DC Plan:  [place]         [facility] Choice  Resumption Of Svcs/PTA Provider   Choice offered to / List presented to:          [facility] arranged  HH-1 RN  [place].   Status of service:   Medicare Important Message given?   (If response is "NO", the following Medicare IM given date fields will be blank) Date Medicare IM given:   Medicare IM given by:   Date Additional Medicare IM given:   Additional Medicare IM given by:    Discharge Disposition:  [place]  Per UR Regulation:  Reviewed for med. necessity/level of care/duration of stay  If discussed at [place] of Stay Meetings, dates discussed:    Comments:

## 2014-05-29 NOTE — Progress Notes (Signed)
Pt stoma site checked, everything looks clean and dry, pt is on room air, sats 98%, and in no distress at this time. Will continue to monitor.

## 2014-05-29 NOTE — Discharge Summary (Signed)
[facility] Discharge Summary  Patient name: [name] Medical record number: [id number] Date of birth: 1951-02-23 Age: 64 y.o. Gender: male Date of Admission: 05/27/2014  Date of Discharge: 05/30/2014   Admitting Physician: Willeen Niece, MD  Primary Care Provider: Howard Pouch, DO Consultants: Neurology  Indication for Hospitalization: Altered mental status  Discharge Diagnoses/Problem List:  Syncope with myoclonic jerking Laryngeal carcinoma Severe protein-calorie malnutrition Acute kidney injury Hypothyroidism Iatrogenic adrenal insufficiency Chronic systolic CHF Essential HTN Chronic pancreatitis History of alcohol abuse Pulmonary nodule  Disposition: Discharged home to the care of daughter with home health PT and nursing   Discharge Condition: Stable  Discharge Exam:  Gen: Thin well-appearing 63 y.o.male in NAD HEENT: Muddy sclerae, conjunctivae normal, moist mucous membranes, posterior oropharynx clear Neck: Tracheostomy with surrounding pink pigmentation without erythema or drainage CV: Regular rate, no murmur; radial, DP and PT pulses 2+ bilaterally; no LE edema Pulm: Non-labored breathing ambient air; CTAB, no wheezes or crackles Skin: No breakdown, wounds, ulcers Neuro: Alert and oriented x4, nonverbal, diffusely diminished muscle bulk, no focal deficits in sensation or motor function, patellar DTRs 2+ bilaterally.  Brief Hospital Course:  [name] is a 64 y.o. male presenting following syncopal events. His history is significant for SCC of larynx/pharynx, systolic CHF and HTN in addition to the above problem list.  He is cared for by his daughter who witnessed a brief loss of consciousness followed by generalized shaking of extremities for a "few minutes." No postictal state was described, though he was hypotensive on arrival. Neurology consultant finds seizure disorder diagnosis dubious and recommended discontinuing  keppra, a home medication. After negative work up including head MRI and sepsis evaluation and empiric treatment, this was thought to be convulsive syncope due to orthostasis. Though he had received tube feeds via PEG tube with free water flushes on schedule he may be dehydrated at home based on daughter's reports of low BPs in light of difficult to control HTN while in the hospital on supplemental IV fluids. Bisoprolol 10mg  was continued though heart rate remained borderline elevated so a TSH was drawn and found to be suppressed by supratherapeutic dose of synthroid. This was decreased from 200 mcg to 175 mcg.   Following PT consultation recommending home health PT and organization of this through [facility], pt was discharged on decreased synthroid dose and discontinued keppra.  Issues for Follow Up:  1. Continue palliative care talks with pt and daughter, remains full care and code 2. Monitor BP and volume status as he may be hypovolemic at home 3. Monitor nutritional status as he is cachectic related to active malignancy 4. Monitor thyroid function in ~ 6 weeks (synthroid dose changed 266mcg to 175 mcg 3/26 due to TSH: 0.078)  Significant Procedures: None  Significant Labs and Imaging:   Recent Labs Lab 05/27/14 1646 05/28/14 0330  WBC 7.0 6.4  HGB 11.2* 9.1*  HCT 34.2* 27.9*  PLT 227 192    Recent Labs Lab 05/27/14 1822 05/28/14 0330 05/28/14 0655  NA 134* 138  --   K 3.9 4.5  --   CL 105 112  --   CO2 21 21  --   GLUCOSE 96 79  --   BUN 24* 16  --   CREATININE 0.82 0.77  --   CALCIUM 7.9* 7.6*  --   MG  --   --  1.9  PHOS  --   --  2.8  ALKPHOS 123* 107  --  AST 28 31  --   ALT 35 28  --   ALBUMIN 3.0* 2.2*  --    UDS - negative Alcohol - negative.   EKG 3/24 - NSR, No acute changes.   Lactic Acid; 3.65 > 0.8 Troponin; neg x1 UA; benign Urine Culture: no growth  CXR 3/23:  IMPRESSION: Probable COPD. No active disease.  Results/Tests Pending at Time of  Discharge: Blood Culture: NGTD at time of discharge  Discharge Medications:    Medication List    STOP taking these medications        levETIRAcetam 100 MG/ML solution  Commonly known as:  KEPPRA      TAKE these medications        aspirin EC 81 MG tablet  Take 81 mg by mouth daily.     bisoprolol 10 MG tablet  Commonly known as:  ZEBETA  Take 1 tablet (10 mg total) by mouth daily.     diclofenac sodium 1 % Gel  Commonly known as:  VOLTAREN  Apply 2 g topically 4 (four) times daily as needed (pain).     feeding supplement (JEVITY 1.2 CAL) Liqd  Place 65 mL/hr into feeding tube continuous.     fentaNYL 25 MCG/HR patch  Commonly known as:  DURAGESIC - dosed mcg/hr  Place 1 patch (25 mcg total) onto the skin every 3 (three) days.     free water Soln  Place 175 mLs into feeding tube 4 (four) times daily.     guaiFENesin 600 MG 12 hr tablet  Commonly known as:  MUCINEX  Take 1 tablet (600 mg total) by mouth 2 (two) times daily.     levothyroxine 175 MCG tablet  Commonly known as:  SYNTHROID, LEVOTHROID  Take 1 tablet (175 mcg total) by mouth daily before breakfast.     magic mouthwash w/lidocaine Soln  Take 5 mLs by mouth 4 (four) times daily.     megestrol 40 MG/ML suspension  Commonly known as:  MEGACE  Take 10 mLs (400 mg total) by mouth 2 (two) times daily.     ondansetron 4 MG tablet  Commonly known as:  ZOFRAN  Take 1 tablet (4 mg total) by mouth every 8 (eight) hours as needed for nausea or vomiting.     oxyCODONE 5 MG/5ML solution  Commonly known as:  ROXICODONE  Take 5 mLs (5 mg total) by mouth every 4 (four) hours as needed for severe pain.        Discharge Instructions: Please refer to Patient Instructions section of EMR for full details.  Patient was counseled important signs and symptoms that should prompt return to medical care, changes in medications, dietary instructions, activity restrictions, and follow up appointments.   Follow-Up  Appointments: Follow-up Information    Follow up with Melancon, York Ram, MD On 06/08/2014.   Specialty:  Family Medicine   Why:  10:30 am   Contact information:   [address] [place] [address] [phone]       Patrecia Pour, MD 05/30/2014, 2:47 AM PGY-2, [place]

## 2014-05-29 NOTE — Evaluation (Signed)
Physical Therapy Evaluation Patient Details Name: [name] MRN: [id number] DOB: Sep 12, 1950 Today's Date: 05/29/2014   History of Present Illness  LATRON RIBAS is a 64 y.o. male presenting with a witnessed seizure. PMH is significant for SCC of larynx/pharynx, systolic CHF, HTN, Malnutrition (PEG tube in place), Cirrhosis, Alcohol dependence, chronic pancreatitis, hypothyroidism, HLD.  Neurology consulted and feels no seizure activity; but syncopal episode.  Clinical Impression  Patient presents with decreased independence with mobility due to deficits listed in PT problem list below.  He will benefit from skilled PT in the acute setting to allow return home with family assist and HHPT.    Follow Up Recommendations Home health PT    Equipment Recommendations  None recommended by PT    Recommendations for Other Services       Precautions / Restrictions Precautions Precautions: Fall Precaution Comments: orthostatic precautions      Mobility  Bed Mobility Overal bed mobility: Needs Assistance Bed Mobility: Supine to Sit;Sit to Supine     Supine to sit: Supervision Sit to supine: Supervision   General bed mobility comments: increased time for assist with managing lines  Transfers Overall transfer level: Needs assistance   Transfers: Sit to/from Stand Sit to Stand: Supervision         General transfer comment: for safety  Ambulation/Gait Ambulation/Gait assistance: Min guard;Supervision Ambulation Distance (Feet): 180 Feet   Gait Pattern/deviations: Wide base of support;Step-through pattern;Decreased stride length     General Gait Details: gait with trunk extension and weak core evident, but no loss of balance, minguard given due to multiple lines, cachexia and general weakness.  Stairs            Wheelchair Mobility    Modified Rankin (Stroke Patients Only)       Balance Overall balance assessment: Needs assistance   Sitting balance-Leahy  Scale: Good       Standing balance-[name]: Fair                               Pertinent Vitals/Pain Pain Assessment: No/denies pain    Home Living Family/patient expects to be discharged to:: Private residence Living Arrangements: Children Available Help at Discharge: Family;Available PRN/intermittently Type of Home: House Home Access: Stairs to enter Entrance Stairs-Rails: Right Entrance Stairs-Number of Steps: 2 Home Layout: One level Home Equipment: None      Prior Function Level of Independence: Independent               Hand Dominance        Extremity/Trunk Assessment   Upper Extremity Assessment: Generalized weakness           Lower Extremity Assessment: Generalized weakness      Cervical / Trunk Assessment: Kyphotic  Communication   Communication: Tracheostomy;Other (comment) (h/o head and neck CA with laryngectomy)  Cognition Arousal/Alertness: Awake/alert Behavior During Therapy: WFL for tasks assessed/performed Overall Cognitive Status: Within Functional Limits for tasks assessed                      General Comments General comments (skin integrity, edema, etc.): Spoke by phone with patient's daughter who reports already active with HHPT/RN through [facility] and wants to resume after discharge.    Exercises General Exercises - Lower Extremity Ankle Circles/Pumps: AROM;Both;10 reps;Seated Long Arc Quad: AROM;Both;5 reps;Seated Hip Flexion/Marching: AROM;Both;5 reps;Seated      Assessment/Plan    PT Assessment Patient needs continued  PT services  PT Diagnosis Generalized weakness   PT Problem List Decreased strength;Decreased mobility;Decreased activity tolerance;Cardiopulmonary status limiting activity;Decreased balance;Decreased knowledge of use of DME  PT Treatment Interventions DME instruction;Therapeutic exercise;Gait training;Balance training;Stair training;Functional mobility training;Therapeutic  activities;Patient/family education   PT Goals (Current goals can be found in the Care Plan section) Acute Rehab PT Goals Patient Stated Goal: To return home PT Goal Formulation: With patient/family Time For Goal Achievement: 06/05/14 Potential to Achieve Goals: Good    Frequency Min 3X/week   Barriers to discharge        Co-evaluation               End of Session Equipment Utilized During Treatment: Gait belt Activity Tolerance: Patient tolerated treatment well Patient left: in bed;with call bell/phone within reach Nurse Communication: Other (comment) (BP)         Time: 0156-1537 PT Time Calculation (min) (ACUTE ONLY): 30 min   Charges:   PT Evaluation $Initial PT Evaluation Tier I: 1 Procedure PT Treatments $Gait Training: 8-22 mins   PT G Codes:        Tashiana Lamarca,CYNDI June 12, 2014, 4:43 PM  Magda Kiel, [place] 06-12-2014 \

## 2014-05-30 DIAGNOSIS — E039 Hypothyroidism, unspecified: Secondary | ICD-10-CM

## 2014-05-30 MED ORDER — LEVOTHYROXINE SODIUM 175 MCG PO TABS: 175.0000 ug | ORAL_TABLET | Freq: Every day | ORAL | Status: DC

## 2014-05-30 NOTE — Progress Notes (Signed)
Dc home with daughter.

## 2014-05-30 NOTE — Progress Notes (Signed)
Notified daughter that patient is for discharge home.

## 2014-05-30 NOTE — Discharge Instructions (Signed)
You were admitted for seizure-like activity that occurred in the setting of syncope (which is just fainting or passing out where you lose consciousness). We were afraid you had an infection but we did not find any infection so you don't need any treatment for that. The neurologist believes you do not have a seizure disorder that we need to treat at this time so we are going to [place]. You will begin receiving home health physical therapy to help you with your strength and to show you how to sit up slowly so you don't pass out again. Your thyroid blood work showed that your synthroid dose is too high so you will need to STOP TAKING SYNTHROID 200MCG and instead TAKE A LOWER DOSE: SYNTHROID 175MCG. This will be sent to your pharmacy. Your blood pressure is also difficult to control but we will not add any medications as you get low at home. This will be measured at [place] 06/08/2014 AT 10:30AM.   If you continue to have readings of low blood pressures, that is ok. But if you experience fainting, severe light-headedness, or severe seizure-like activity you should seek medical care.

## 2014-05-31 DIAGNOSIS — I5021 Acute systolic (congestive) heart failure: Secondary | ICD-10-CM | POA: Diagnosis not present

## 2014-05-31 DIAGNOSIS — E43 Unspecified severe protein-calorie malnutrition: Secondary | ICD-10-CM | POA: Diagnosis not present

## 2014-05-31 DIAGNOSIS — R1310 Dysphagia, unspecified: Secondary | ICD-10-CM | POA: Diagnosis not present

## 2014-05-31 DIAGNOSIS — C321 Malignant neoplasm of supraglottis: Secondary | ICD-10-CM | POA: Diagnosis not present

## 2014-05-31 DIAGNOSIS — R636 Underweight: Secondary | ICD-10-CM | POA: Diagnosis not present

## 2014-05-31 DIAGNOSIS — C140 Malignant neoplasm of pharynx, unspecified: Secondary | ICD-10-CM | POA: Diagnosis not present

## 2014-05-31 NOTE — Progress Notes (Signed)
CARE MANAGEMENT NOTE 05/31/2014  Patient:  JT, BRABEC   Account Number:  [id number]  Date Initiated:  05/29/2014  Documentation initiated by:  Elissa Hefty  Subjective/Objective Assessment:   adm w seizure     Action/Plan:   lives w fam, act we ahc for rn and sw, pcp dr Joseph Art Janann August   Anticipated DC Date:     Anticipated DC Plan:  [place]         [facility] Choice  Resumption Of Svcs/PTA Provider   Choice offered to / List presented to:          [facility] arranged  HH-1 RN  [place].   Status of service:  Completed, signed off Medicare Important Message given?   (If response is "NO", the following Medicare IM given date fields will be blank) Date Medicare IM given:   Medicare IM given by:   Date Additional Medicare IM given:   Additional Medicare IM given by:    Discharge Disposition:  [place]  Per UR Regulation:  Reviewed for med. necessity/level of care/duration of stay  If discussed at [place] of Stay Meetings, dates discussed:    Comments:  05/30/14 12:07 CM notified [facility] rep, Katie of discharge.  No other CM needs communicated.  Mariane Masters, BSN, cm [phone].

## 2014-06-02 DIAGNOSIS — C321 Malignant neoplasm of supraglottis: Secondary | ICD-10-CM | POA: Diagnosis not present

## 2014-06-02 DIAGNOSIS — E43 Unspecified severe protein-calorie malnutrition: Secondary | ICD-10-CM | POA: Diagnosis not present

## 2014-06-02 DIAGNOSIS — Z888 Allergy status to other drugs, medicaments and biological substances status: Secondary | ICD-10-CM | POA: Diagnosis not present

## 2014-06-02 DIAGNOSIS — C051 Malignant neoplasm of soft palate: Secondary | ICD-10-CM | POA: Diagnosis not present

## 2014-06-02 DIAGNOSIS — G893 Neoplasm related pain (acute) (chronic): Secondary | ICD-10-CM | POA: Diagnosis not present

## 2014-06-02 DIAGNOSIS — Z5181 Encounter for therapeutic drug level monitoring: Secondary | ICD-10-CM | POA: Diagnosis not present

## 2014-06-02 DIAGNOSIS — Z79899 Other long term (current) drug therapy: Secondary | ICD-10-CM | POA: Diagnosis not present

## 2014-06-02 DIAGNOSIS — Z885 Allergy status to narcotic agent status: Secondary | ICD-10-CM | POA: Diagnosis not present

## 2014-06-02 DIAGNOSIS — Z7982 Long term (current) use of aspirin: Secondary | ICD-10-CM | POA: Diagnosis not present

## 2014-06-02 DIAGNOSIS — I5021 Acute systolic (congestive) heart failure: Secondary | ICD-10-CM | POA: Diagnosis not present

## 2014-06-02 DIAGNOSIS — M792 Neuralgia and neuritis, unspecified: Secondary | ICD-10-CM | POA: Diagnosis not present

## 2014-06-02 DIAGNOSIS — Z9889 Other specified postprocedural states: Secondary | ICD-10-CM | POA: Diagnosis not present

## 2014-06-02 DIAGNOSIS — R1310 Dysphagia, unspecified: Secondary | ICD-10-CM | POA: Diagnosis not present

## 2014-06-02 DIAGNOSIS — I10 Essential (primary) hypertension: Secondary | ICD-10-CM | POA: Diagnosis not present

## 2014-06-02 DIAGNOSIS — Z510 Encounter for antineoplastic radiation therapy: Secondary | ICD-10-CM | POA: Diagnosis not present

## 2014-06-02 DIAGNOSIS — R52 Pain, unspecified: Secondary | ICD-10-CM | POA: Diagnosis not present

## 2014-06-02 DIAGNOSIS — Z87891 Personal history of nicotine dependence: Secondary | ICD-10-CM | POA: Diagnosis not present

## 2014-06-02 DIAGNOSIS — R636 Underweight: Secondary | ICD-10-CM | POA: Diagnosis not present

## 2014-06-02 DIAGNOSIS — C140 Malignant neoplasm of pharynx, unspecified: Secondary | ICD-10-CM | POA: Diagnosis not present

## 2014-06-02 LAB — CULTURE, BLOOD (ROUTINE X 2)
Culture: NO GROWTH
Culture: NO GROWTH

## 2014-06-03 DIAGNOSIS — C321 Malignant neoplasm of supraglottis: Secondary | ICD-10-CM | POA: Diagnosis not present

## 2014-06-03 DIAGNOSIS — R636 Underweight: Secondary | ICD-10-CM | POA: Diagnosis not present

## 2014-06-03 DIAGNOSIS — R1310 Dysphagia, unspecified: Secondary | ICD-10-CM | POA: Diagnosis not present

## 2014-06-03 DIAGNOSIS — E43 Unspecified severe protein-calorie malnutrition: Secondary | ICD-10-CM | POA: Diagnosis not present

## 2014-06-03 DIAGNOSIS — C051 Malignant neoplasm of soft palate: Secondary | ICD-10-CM | POA: Diagnosis not present

## 2014-06-03 DIAGNOSIS — I5021 Acute systolic (congestive) heart failure: Secondary | ICD-10-CM | POA: Diagnosis not present

## 2014-06-03 DIAGNOSIS — Z510 Encounter for antineoplastic radiation therapy: Secondary | ICD-10-CM | POA: Diagnosis not present

## 2014-06-03 DIAGNOSIS — C140 Malignant neoplasm of pharynx, unspecified: Secondary | ICD-10-CM | POA: Diagnosis not present

## 2014-06-04 ENCOUNTER — Telehealth: Payer: Self-pay | Admitting: Family Medicine

## 2014-06-04 DIAGNOSIS — C051 Malignant neoplasm of soft palate: Secondary | ICD-10-CM | POA: Diagnosis not present

## 2014-06-04 DIAGNOSIS — Z510 Encounter for antineoplastic radiation therapy: Secondary | ICD-10-CM | POA: Diagnosis not present

## 2014-06-04 DIAGNOSIS — C140 Malignant neoplasm of pharynx, unspecified: Secondary | ICD-10-CM | POA: Diagnosis not present

## 2014-06-04 NOTE — Telephone Encounter (Signed)
Received a staff message from [facility] nurse that patient is still running a low grade temperature and he has new "green discharge" from his peg tube insertion site. Please call pt and have them make a same day appointment with [place] or if he continues to be ill, or declining he should go to ED. Thanks.

## 2014-06-04 NOTE — Telephone Encounter (Signed)
Spoke with pt's sister; pt was unable to talk.  Advised her to make an appt with [facility] or go to ED if pt is still having low grade temp and discharge from peg tube.  She stated she will give the message to pt's daughter who is his caregiver.  Daughter was at an appt. She was not sure if pt was still having a fever or discharge.  Pt's daughter should be returning call to [facility] when she returns.  Derl Barrow, RN

## 2014-06-05 ENCOUNTER — Ambulatory Visit (INDEPENDENT_AMBULATORY_CARE_PROVIDER_SITE_OTHER): Payer: Medicare Other | Admitting: Family Medicine

## 2014-06-05 ENCOUNTER — Encounter: Payer: Self-pay | Admitting: Family Medicine

## 2014-06-05 VITALS — BP 117/81 | HR 93 | Temp 98.3°F | Wt 110.0 lb

## 2014-06-05 DIAGNOSIS — L24A9 Irritant contact dermatitis due friction or contact with other specified body fluids: Secondary | ICD-10-CM

## 2014-06-05 DIAGNOSIS — C140 Malignant neoplasm of pharynx, unspecified: Secondary | ICD-10-CM | POA: Diagnosis not present

## 2014-06-05 DIAGNOSIS — T148 Other injury of unspecified body region: Secondary | ICD-10-CM

## 2014-06-05 DIAGNOSIS — T148XXA Other injury of unspecified body region, initial encounter: Secondary | ICD-10-CM

## 2014-06-05 DIAGNOSIS — Z431 Encounter for attention to gastrostomy: Secondary | ICD-10-CM | POA: Diagnosis not present

## 2014-06-05 DIAGNOSIS — Z510 Encounter for antineoplastic radiation therapy: Secondary | ICD-10-CM | POA: Diagnosis not present

## 2014-06-05 DIAGNOSIS — C051 Malignant neoplasm of soft palate: Secondary | ICD-10-CM | POA: Diagnosis not present

## 2014-06-05 NOTE — Patient Instructions (Signed)
We applied silver nitrate to the gtube site This should decrease the drainage Follow up on Monday as scheduled It does not seem infected  Be well, Dr. Ardelia Mems

## 2014-06-05 NOTE — Progress Notes (Signed)
[name], male   DOB: 02/02/1951, 64 y.o.   MRN: [id number]  HPI:  Pt presents for a same day appointment to discuss  Drainage around the G-tube. Had his G-tube placed about a month ago.  He was admitted to the hospital and discharged over the weekend due to altered mental status. His family is concerned that he may have an infection in his G-tube. There has been mildly purulent drainage recently. He had a temperature of 99.8 orally yesterday.  The G-tube is working normally. Patient denies being in pain.  ROS: See HPI  [place]:  Chronic systolic heart failure, hypertension, throat cancer, , chronic pancreatitis, hypothyroidism  PHYSICAL EXAM: BP 117/81 mmHg  Pulse 93  Temp(Src) 98.3 F (36.8 C) (Oral)  Wt 110 lb (49.896 kg) Gen:  No acute distress, pleasant, cooperative, nonverbal due to history of throat cancer Abdomen:  Soft, nontender to palpation. Button type G-tube present and left upper quadrant. Mild thick. Drainage present on dressing. With removal of dressing, no drainage seen. There is some moist granulation tissue present around the G-tube site. No tenderness.  ASSESSMENT/PLAN:  1.  Drainage around G tube: Afebrile here today. Does not appear infected. Suspect this drainage was due to the moist granulation tissue around the G-tube site. We applied silver nitrate today to help calm this down. Patient has a follow-up appointment scheduled on Monday with Dr. Minda Ditto for hospital follow up.  FOLLOW UP: F/u in 3 days for scheduled hospital f/u.  [place]. Ardelia Mems, [place]

## 2014-06-06 DIAGNOSIS — R636 Underweight: Secondary | ICD-10-CM | POA: Diagnosis not present

## 2014-06-06 DIAGNOSIS — C321 Malignant neoplasm of supraglottis: Secondary | ICD-10-CM | POA: Diagnosis not present

## 2014-06-06 DIAGNOSIS — E43 Unspecified severe protein-calorie malnutrition: Secondary | ICD-10-CM | POA: Diagnosis not present

## 2014-06-06 DIAGNOSIS — C140 Malignant neoplasm of pharynx, unspecified: Secondary | ICD-10-CM | POA: Diagnosis not present

## 2014-06-06 DIAGNOSIS — I5021 Acute systolic (congestive) heart failure: Secondary | ICD-10-CM | POA: Diagnosis not present

## 2014-06-06 DIAGNOSIS — R1310 Dysphagia, unspecified: Secondary | ICD-10-CM | POA: Diagnosis not present

## 2014-06-08 ENCOUNTER — Inpatient Hospital Stay: Payer: Medicare Other | Admitting: Family Medicine

## 2014-06-08 DIAGNOSIS — C051 Malignant neoplasm of soft palate: Secondary | ICD-10-CM | POA: Diagnosis not present

## 2014-06-08 DIAGNOSIS — Z510 Encounter for antineoplastic radiation therapy: Secondary | ICD-10-CM | POA: Diagnosis not present

## 2014-06-08 DIAGNOSIS — C140 Malignant neoplasm of pharynx, unspecified: Secondary | ICD-10-CM | POA: Diagnosis not present

## 2014-06-09 DIAGNOSIS — C140 Malignant neoplasm of pharynx, unspecified: Secondary | ICD-10-CM | POA: Diagnosis not present

## 2014-06-09 DIAGNOSIS — C321 Malignant neoplasm of supraglottis: Secondary | ICD-10-CM | POA: Diagnosis not present

## 2014-06-09 DIAGNOSIS — I5021 Acute systolic (congestive) heart failure: Secondary | ICD-10-CM | POA: Diagnosis not present

## 2014-06-09 DIAGNOSIS — C051 Malignant neoplasm of soft palate: Secondary | ICD-10-CM | POA: Diagnosis not present

## 2014-06-09 DIAGNOSIS — E43 Unspecified severe protein-calorie malnutrition: Secondary | ICD-10-CM | POA: Diagnosis not present

## 2014-06-09 DIAGNOSIS — R636 Underweight: Secondary | ICD-10-CM | POA: Diagnosis not present

## 2014-06-09 DIAGNOSIS — R1310 Dysphagia, unspecified: Secondary | ICD-10-CM | POA: Diagnosis not present

## 2014-06-09 DIAGNOSIS — Z510 Encounter for antineoplastic radiation therapy: Secondary | ICD-10-CM | POA: Diagnosis not present

## 2014-06-10 DIAGNOSIS — Z510 Encounter for antineoplastic radiation therapy: Secondary | ICD-10-CM | POA: Diagnosis not present

## 2014-06-10 DIAGNOSIS — C051 Malignant neoplasm of soft palate: Secondary | ICD-10-CM | POA: Diagnosis not present

## 2014-06-10 DIAGNOSIS — C140 Malignant neoplasm of pharynx, unspecified: Secondary | ICD-10-CM | POA: Diagnosis not present

## 2014-06-11 DIAGNOSIS — C140 Malignant neoplasm of pharynx, unspecified: Secondary | ICD-10-CM | POA: Diagnosis not present

## 2014-06-11 DIAGNOSIS — Z79899 Other long term (current) drug therapy: Secondary | ICD-10-CM | POA: Diagnosis not present

## 2014-06-11 DIAGNOSIS — I10 Essential (primary) hypertension: Secondary | ICD-10-CM | POA: Diagnosis not present

## 2014-06-11 DIAGNOSIS — C321 Malignant neoplasm of supraglottis: Secondary | ICD-10-CM | POA: Diagnosis not present

## 2014-06-11 DIAGNOSIS — Z885 Allergy status to narcotic agent status: Secondary | ICD-10-CM | POA: Diagnosis not present

## 2014-06-11 DIAGNOSIS — Z9889 Other specified postprocedural states: Secondary | ICD-10-CM | POA: Diagnosis not present

## 2014-06-11 DIAGNOSIS — R1310 Dysphagia, unspecified: Secondary | ICD-10-CM | POA: Diagnosis not present

## 2014-06-11 DIAGNOSIS — Z931 Gastrostomy status: Secondary | ICD-10-CM | POA: Diagnosis not present

## 2014-06-11 DIAGNOSIS — Z8521 Personal history of malignant neoplasm of larynx: Secondary | ICD-10-CM | POA: Diagnosis not present

## 2014-06-11 DIAGNOSIS — Z510 Encounter for antineoplastic radiation therapy: Secondary | ICD-10-CM | POA: Diagnosis not present

## 2014-06-11 DIAGNOSIS — R52 Pain, unspecified: Secondary | ICD-10-CM | POA: Diagnosis not present

## 2014-06-11 DIAGNOSIS — Z87891 Personal history of nicotine dependence: Secondary | ICD-10-CM | POA: Diagnosis not present

## 2014-06-11 DIAGNOSIS — Z7982 Long term (current) use of aspirin: Secondary | ICD-10-CM | POA: Diagnosis not present

## 2014-06-11 DIAGNOSIS — C051 Malignant neoplasm of soft palate: Secondary | ICD-10-CM | POA: Diagnosis not present

## 2014-06-11 DIAGNOSIS — Z888 Allergy status to other drugs, medicaments and biological substances status: Secondary | ICD-10-CM | POA: Diagnosis not present

## 2014-06-12 DIAGNOSIS — C140 Malignant neoplasm of pharynx, unspecified: Secondary | ICD-10-CM | POA: Diagnosis not present

## 2014-06-12 DIAGNOSIS — Z510 Encounter for antineoplastic radiation therapy: Secondary | ICD-10-CM | POA: Diagnosis not present

## 2014-06-12 DIAGNOSIS — E43 Unspecified severe protein-calorie malnutrition: Secondary | ICD-10-CM | POA: Diagnosis not present

## 2014-06-12 DIAGNOSIS — R1310 Dysphagia, unspecified: Secondary | ICD-10-CM | POA: Diagnosis not present

## 2014-06-12 DIAGNOSIS — C051 Malignant neoplasm of soft palate: Secondary | ICD-10-CM | POA: Diagnosis not present

## 2014-06-12 DIAGNOSIS — I5021 Acute systolic (congestive) heart failure: Secondary | ICD-10-CM | POA: Diagnosis not present

## 2014-06-12 DIAGNOSIS — C321 Malignant neoplasm of supraglottis: Secondary | ICD-10-CM | POA: Diagnosis not present

## 2014-06-12 DIAGNOSIS — R636 Underweight: Secondary | ICD-10-CM | POA: Diagnosis not present

## 2014-06-15 ENCOUNTER — Ambulatory Visit (INDEPENDENT_AMBULATORY_CARE_PROVIDER_SITE_OTHER): Payer: Medicare Other | Admitting: Family Medicine

## 2014-06-15 ENCOUNTER — Encounter: Payer: Self-pay | Admitting: Family Medicine

## 2014-06-15 VITALS — BP 118/86 | HR 110 | Temp 98.2°F | Ht 71.0 in | Wt 107.8 lb

## 2014-06-15 DIAGNOSIS — C051 Malignant neoplasm of soft palate: Secondary | ICD-10-CM | POA: Diagnosis not present

## 2014-06-15 DIAGNOSIS — Z09 Encounter for follow-up examination after completed treatment for conditions other than malignant neoplasm: Secondary | ICD-10-CM

## 2014-06-15 DIAGNOSIS — C140 Malignant neoplasm of pharynx, unspecified: Secondary | ICD-10-CM | POA: Diagnosis not present

## 2014-06-15 DIAGNOSIS — Z510 Encounter for antineoplastic radiation therapy: Secondary | ICD-10-CM | POA: Diagnosis not present

## 2014-06-15 NOTE — Patient Instructions (Signed)
Thanks for coming in today.   Everything looks good.   Continue to follow up at [facility] per your previous appointments.   If he has any more seizure-like episodes and you feel that he needs to go to the ED, then please try to bring him to the main [facility].   Thanks for letting us take care of you.   Sincerely,  Paula Compton, MD Family Medicine - PGY 1

## 2014-06-15 NOTE — Progress Notes (Signed)
[name], male   DOB: [birth year], 64 y.o.   MRN: [id number]   [facility] Aquilla Hacker, MD Phone: [phone]  Subjective:   # Hospital Discharge Follow Up - Pt recently hospitalized due to seizure-like episodes that were subsequently thought not to be seizures, but rather convulsive syncope. He had previously been started on Keppra, but this was discontinued.  - He has continued to be followed by [facility] radiation oncology as well as their oncology team. He is currently getting radiation / chemotherapy there per their reirradiation protocol.  - He is also followed by [facility] ENT and is following up with them as well.  - He has had no issues since discharge other than a small amount of drainage around his gastric tube. He was seen in the clinic and had silver nitrate applied to the edges of his port-site to improve scarring and reduce drainage around his G-tube.  - He has had no further seizure episodes. His daughter says that she will bring him to the [facility] rather than [name] [facility] if he has another episode and if he is safe for transport by her.  - Hydrating well via gastric tube.  - He says that chemo and radiation are going well, and gives me a thumbs up in clinic today.  - No more syncope at home, and his blood pressure has been stable at home as well.  - No other concerns.   All relevant systems were reviewed and were negative unless otherwise noted in the HPI  Past Medical History Reviewed problem list.  Medications- reviewed and updated Current Outpatient Prescriptions  Medication Sig Dispense Refill  . Alum & Mag Hydroxide-Simeth (MAGIC MOUTHWASH W/LIDOCAINE) SOLN Take 5 mLs by mouth 4 (four) times daily. 120 mL 0  . aspirin EC 81 MG tablet Take 81 mg by mouth daily.    . bisoprolol (ZEBETA) 10 MG tablet Take 1 tablet (10 mg total) by mouth daily. 30 tablet 4  . diclofenac sodium (VOLTAREN) 1 % GEL Apply 2 g topically 4 (four) times  daily as needed (pain). (Patient not taking: Reported on 05/28/2014) 100 g 6  . fentaNYL (DURAGESIC - DOSED MCG/HR) 25 MCG/HR patch Place 1 patch (25 mcg total) onto the skin every 3 (three) days. 5 patch 0  . guaiFENesin (MUCINEX) 600 MG 12 hr tablet Take 1 tablet (600 mg total) by mouth 2 (two) times daily. 120 tablet 3  . levothyroxine (SYNTHROID, LEVOTHROID) 175 MCG tablet Take 1 tablet (175 mcg total) by mouth daily before breakfast. 90 tablet 0  . megestrol (MEGACE) 40 MG/ML suspension Take 10 mLs (400 mg total) by mouth 2 (two) times daily. 240 mL 2  . Nutritional Supplements (FEEDING SUPPLEMENT, JEVITY 1.2 CAL,) LIQD Place 65 mL/hr into feeding tube continuous. 1000 mL 0  . ondansetron (ZOFRAN) 4 MG tablet Take 1 tablet (4 mg total) by mouth every 8 (eight) hours as needed for nausea or vomiting. 60 tablet 0  . oxyCODONE (ROXICODONE) 5 MG/5ML solution Take 5 mLs (5 mg total) by mouth every 4 (four) hours as needed for severe pain. 90 mL 0  . Water For Irrigation, Sterile (FREE WATER) SOLN Place 175 mLs into feeding tube 4 (four) times daily. 1000 mL 0   No current facility-administered medications for this visit.   Chief complaint-noted No additions to family history Social history- patient is a former smoker  Objective: BP 118/86 mmHg  Pulse 110  Temp(Src) 98.2  F (36.8 C) (Oral)  Ht 5\' 11"  (1.803 m)  Wt 107 lb 12.8 oz (48.898 kg)  BMI 15.04 kg/m2 Gen: NAD, alert, cooperative with exam, ambulates in and out of the exam room.  HEENT: NCAT, EOMI, PERRL, s/p Laryngectomy, well-healed surgical scars, O/P clear. No LAD to exam. No palpable masses.  Neck: FROM, supple CV: RRR, good S1/S2, no murmur Resp: CTABL, no wheezes, non-labored Abd: SNTND, BS present, no guarding or organomegaly, Port-site C/D/I, slight drainage, but overall appears to be in good shape.  Ext: No edema, warm, normal tone, moves UE/LE spontaneously Neuro: Alert and oriented, No gross deficits Skin: no rashes no  lesions  Assessment/Plan:   # Hospital Follow Up - Pt. With no further seizure like episodes. Daughter is planning for them to continue to follow up with neurology. No other issues at home. Hydrating well. Continues to follow with [facility] / Radiation Oncology, ENT. No further palliative discussions.  - Continue to follow up with [facility] appointments ENT, Rad Onc / Onc.  - Return to clinic for any further issues.  - F/U with your PCP per your regular schedule.  - Discussed bringing Mr. Ciotti to [facility] rather than [facility] with daughter if he were to need emergent treatment.

## 2014-06-15 NOTE — Progress Notes (Signed)
One of the clinic preceptor available.

## 2014-06-16 DIAGNOSIS — Z510 Encounter for antineoplastic radiation therapy: Secondary | ICD-10-CM | POA: Diagnosis not present

## 2014-06-16 DIAGNOSIS — C051 Malignant neoplasm of soft palate: Secondary | ICD-10-CM | POA: Diagnosis not present

## 2014-06-16 DIAGNOSIS — C140 Malignant neoplasm of pharynx, unspecified: Secondary | ICD-10-CM | POA: Diagnosis not present

## 2014-06-17 DIAGNOSIS — Z510 Encounter for antineoplastic radiation therapy: Secondary | ICD-10-CM | POA: Diagnosis not present

## 2014-06-17 DIAGNOSIS — C051 Malignant neoplasm of soft palate: Secondary | ICD-10-CM | POA: Diagnosis not present

## 2014-06-17 DIAGNOSIS — C140 Malignant neoplasm of pharynx, unspecified: Secondary | ICD-10-CM | POA: Diagnosis not present

## 2014-06-18 DIAGNOSIS — C140 Malignant neoplasm of pharynx, unspecified: Secondary | ICD-10-CM | POA: Diagnosis not present

## 2014-06-18 DIAGNOSIS — C051 Malignant neoplasm of soft palate: Secondary | ICD-10-CM | POA: Diagnosis not present

## 2014-06-18 DIAGNOSIS — Z5111 Encounter for antineoplastic chemotherapy: Secondary | ICD-10-CM | POA: Diagnosis not present

## 2014-06-18 DIAGNOSIS — Z510 Encounter for antineoplastic radiation therapy: Secondary | ICD-10-CM | POA: Diagnosis not present

## 2014-06-19 ENCOUNTER — Telehealth: Payer: Self-pay | Admitting: Family Medicine

## 2014-06-19 DIAGNOSIS — C140 Malignant neoplasm of pharynx, unspecified: Secondary | ICD-10-CM | POA: Diagnosis not present

## 2014-06-19 DIAGNOSIS — C321 Malignant neoplasm of supraglottis: Secondary | ICD-10-CM | POA: Diagnosis not present

## 2014-06-19 DIAGNOSIS — I5021 Acute systolic (congestive) heart failure: Secondary | ICD-10-CM | POA: Diagnosis not present

## 2014-06-19 DIAGNOSIS — Z510 Encounter for antineoplastic radiation therapy: Secondary | ICD-10-CM | POA: Diagnosis not present

## 2014-06-19 DIAGNOSIS — C051 Malignant neoplasm of soft palate: Secondary | ICD-10-CM | POA: Diagnosis not present

## 2014-06-19 DIAGNOSIS — R1310 Dysphagia, unspecified: Secondary | ICD-10-CM | POA: Diagnosis not present

## 2014-06-19 DIAGNOSIS — R636 Underweight: Secondary | ICD-10-CM | POA: Diagnosis not present

## 2014-06-19 DIAGNOSIS — E43 Unspecified severe protein-calorie malnutrition: Secondary | ICD-10-CM | POA: Diagnosis not present

## 2014-06-19 NOTE — Telephone Encounter (Signed)
Certification for home health expires today, is requesting verbal orders to continue home health, also wants to report that pt has been running low grade temperatures for at least 2 weeks (99 and above). Has some drainage around his peg tube site, drainage is a yellow/green color with some odor. Please advise.

## 2014-06-20 DIAGNOSIS — K861 Other chronic pancreatitis: Secondary | ICD-10-CM | POA: Diagnosis not present

## 2014-06-20 DIAGNOSIS — R1310 Dysphagia, unspecified: Secondary | ICD-10-CM | POA: Diagnosis not present

## 2014-06-20 DIAGNOSIS — K7030 Alcoholic cirrhosis of liver without ascites: Secondary | ICD-10-CM | POA: Diagnosis not present

## 2014-06-20 DIAGNOSIS — C140 Malignant neoplasm of pharynx, unspecified: Secondary | ICD-10-CM | POA: Diagnosis not present

## 2014-06-20 DIAGNOSIS — C321 Malignant neoplasm of supraglottis: Secondary | ICD-10-CM | POA: Diagnosis not present

## 2014-06-20 DIAGNOSIS — I5022 Chronic systolic (congestive) heart failure: Secondary | ICD-10-CM | POA: Diagnosis not present

## 2014-06-20 DIAGNOSIS — J9620 Acute and chronic respiratory failure, unspecified whether with hypoxia or hypercapnia: Secondary | ICD-10-CM | POA: Diagnosis not present

## 2014-06-20 DIAGNOSIS — E43 Unspecified severe protein-calorie malnutrition: Secondary | ICD-10-CM | POA: Diagnosis not present

## 2014-06-20 DIAGNOSIS — Z930 Tracheostomy status: Secondary | ICD-10-CM | POA: Diagnosis not present

## 2014-06-20 DIAGNOSIS — Z931 Gastrostomy status: Secondary | ICD-10-CM | POA: Diagnosis not present

## 2014-06-22 DIAGNOSIS — I5022 Chronic systolic (congestive) heart failure: Secondary | ICD-10-CM | POA: Diagnosis not present

## 2014-06-22 DIAGNOSIS — K861 Other chronic pancreatitis: Secondary | ICD-10-CM | POA: Diagnosis not present

## 2014-06-22 DIAGNOSIS — R1310 Dysphagia, unspecified: Secondary | ICD-10-CM | POA: Diagnosis not present

## 2014-06-22 DIAGNOSIS — C140 Malignant neoplasm of pharynx, unspecified: Secondary | ICD-10-CM | POA: Diagnosis not present

## 2014-06-22 DIAGNOSIS — Z510 Encounter for antineoplastic radiation therapy: Secondary | ICD-10-CM | POA: Diagnosis not present

## 2014-06-22 DIAGNOSIS — C321 Malignant neoplasm of supraglottis: Secondary | ICD-10-CM | POA: Diagnosis not present

## 2014-06-22 DIAGNOSIS — C051 Malignant neoplasm of soft palate: Secondary | ICD-10-CM | POA: Diagnosis not present

## 2014-06-22 DIAGNOSIS — E43 Unspecified severe protein-calorie malnutrition: Secondary | ICD-10-CM | POA: Diagnosis not present

## 2014-06-22 NOTE — Telephone Encounter (Signed)
Home health orders can be verbally given to home health agency to continue his home visits. I'm uncertain what agency he goes through. As far as the low-grade temperature and drainage from his PEG tube site, this has been evaluated twice within the last 2 weeks by other physicians and not felt to be a concern for infection. If drainage increases, or he becomes febrile, then he should be evaluated again. Thanks.

## 2014-06-22 NOTE — Telephone Encounter (Signed)
[employer] and gave verbal orders and message below

## 2014-06-23 DIAGNOSIS — Z510 Encounter for antineoplastic radiation therapy: Secondary | ICD-10-CM | POA: Diagnosis not present

## 2014-06-23 DIAGNOSIS — C051 Malignant neoplasm of soft palate: Secondary | ICD-10-CM | POA: Diagnosis not present

## 2014-06-23 DIAGNOSIS — C140 Malignant neoplasm of pharynx, unspecified: Secondary | ICD-10-CM | POA: Diagnosis not present

## 2014-06-24 DIAGNOSIS — Z510 Encounter for antineoplastic radiation therapy: Secondary | ICD-10-CM | POA: Diagnosis not present

## 2014-06-24 DIAGNOSIS — C051 Malignant neoplasm of soft palate: Secondary | ICD-10-CM | POA: Diagnosis not present

## 2014-06-24 DIAGNOSIS — C140 Malignant neoplasm of pharynx, unspecified: Secondary | ICD-10-CM | POA: Diagnosis not present

## 2014-06-25 DIAGNOSIS — Z85818 Personal history of malignant neoplasm of other sites of lip, oral cavity, and pharynx: Secondary | ICD-10-CM | POA: Diagnosis not present

## 2014-06-25 DIAGNOSIS — C140 Malignant neoplasm of pharynx, unspecified: Secondary | ICD-10-CM | POA: Diagnosis not present

## 2014-06-25 DIAGNOSIS — R1310 Dysphagia, unspecified: Secondary | ICD-10-CM | POA: Diagnosis not present

## 2014-06-25 DIAGNOSIS — Z885 Allergy status to narcotic agent status: Secondary | ICD-10-CM | POA: Diagnosis not present

## 2014-06-25 DIAGNOSIS — Z79899 Other long term (current) drug therapy: Secondary | ICD-10-CM | POA: Diagnosis not present

## 2014-06-25 DIAGNOSIS — Z9002 Acquired absence of larynx: Secondary | ICD-10-CM | POA: Diagnosis not present

## 2014-06-25 DIAGNOSIS — Z923 Personal history of irradiation: Secondary | ICD-10-CM | POA: Diagnosis not present

## 2014-06-25 DIAGNOSIS — Z8521 Personal history of malignant neoplasm of larynx: Secondary | ICD-10-CM | POA: Diagnosis not present

## 2014-06-25 DIAGNOSIS — Z931 Gastrostomy status: Secondary | ICD-10-CM | POA: Diagnosis not present

## 2014-06-25 DIAGNOSIS — Z888 Allergy status to other drugs, medicaments and biological substances status: Secondary | ICD-10-CM | POA: Diagnosis not present

## 2014-06-25 DIAGNOSIS — Z483 Aftercare following surgery for neoplasm: Secondary | ICD-10-CM | POA: Diagnosis not present

## 2014-06-25 DIAGNOSIS — C051 Malignant neoplasm of soft palate: Secondary | ICD-10-CM | POA: Diagnosis not present

## 2014-06-25 DIAGNOSIS — Z510 Encounter for antineoplastic radiation therapy: Secondary | ICD-10-CM | POA: Diagnosis not present

## 2014-06-25 DIAGNOSIS — Z87891 Personal history of nicotine dependence: Secondary | ICD-10-CM | POA: Diagnosis not present

## 2014-06-26 DIAGNOSIS — Z510 Encounter for antineoplastic radiation therapy: Secondary | ICD-10-CM | POA: Diagnosis not present

## 2014-06-26 DIAGNOSIS — C140 Malignant neoplasm of pharynx, unspecified: Secondary | ICD-10-CM | POA: Diagnosis not present

## 2014-06-26 DIAGNOSIS — K861 Other chronic pancreatitis: Secondary | ICD-10-CM | POA: Diagnosis not present

## 2014-06-26 DIAGNOSIS — E43 Unspecified severe protein-calorie malnutrition: Secondary | ICD-10-CM | POA: Diagnosis not present

## 2014-06-26 DIAGNOSIS — I5022 Chronic systolic (congestive) heart failure: Secondary | ICD-10-CM | POA: Diagnosis not present

## 2014-06-26 DIAGNOSIS — R1310 Dysphagia, unspecified: Secondary | ICD-10-CM | POA: Diagnosis not present

## 2014-06-26 DIAGNOSIS — C321 Malignant neoplasm of supraglottis: Secondary | ICD-10-CM | POA: Diagnosis not present

## 2014-06-26 DIAGNOSIS — C051 Malignant neoplasm of soft palate: Secondary | ICD-10-CM | POA: Diagnosis not present

## 2014-06-29 DIAGNOSIS — Z510 Encounter for antineoplastic radiation therapy: Secondary | ICD-10-CM | POA: Diagnosis not present

## 2014-06-29 DIAGNOSIS — C051 Malignant neoplasm of soft palate: Secondary | ICD-10-CM | POA: Diagnosis not present

## 2014-06-29 DIAGNOSIS — C140 Malignant neoplasm of pharynx, unspecified: Secondary | ICD-10-CM | POA: Diagnosis not present

## 2014-06-30 DIAGNOSIS — R1310 Dysphagia, unspecified: Secondary | ICD-10-CM | POA: Diagnosis not present

## 2014-06-30 DIAGNOSIS — E43 Unspecified severe protein-calorie malnutrition: Secondary | ICD-10-CM | POA: Diagnosis not present

## 2014-06-30 DIAGNOSIS — C051 Malignant neoplasm of soft palate: Secondary | ICD-10-CM | POA: Diagnosis not present

## 2014-06-30 DIAGNOSIS — K861 Other chronic pancreatitis: Secondary | ICD-10-CM | POA: Diagnosis not present

## 2014-06-30 DIAGNOSIS — C140 Malignant neoplasm of pharynx, unspecified: Secondary | ICD-10-CM | POA: Diagnosis not present

## 2014-06-30 DIAGNOSIS — C321 Malignant neoplasm of supraglottis: Secondary | ICD-10-CM | POA: Diagnosis not present

## 2014-06-30 DIAGNOSIS — Z510 Encounter for antineoplastic radiation therapy: Secondary | ICD-10-CM | POA: Diagnosis not present

## 2014-06-30 DIAGNOSIS — I5022 Chronic systolic (congestive) heart failure: Secondary | ICD-10-CM | POA: Diagnosis not present

## 2014-07-01 DIAGNOSIS — C140 Malignant neoplasm of pharynx, unspecified: Secondary | ICD-10-CM | POA: Diagnosis not present

## 2014-07-01 DIAGNOSIS — Z510 Encounter for antineoplastic radiation therapy: Secondary | ICD-10-CM | POA: Diagnosis not present

## 2014-07-01 DIAGNOSIS — C051 Malignant neoplasm of soft palate: Secondary | ICD-10-CM | POA: Diagnosis not present

## 2014-07-02 DIAGNOSIS — Z510 Encounter for antineoplastic radiation therapy: Secondary | ICD-10-CM | POA: Diagnosis not present

## 2014-07-02 DIAGNOSIS — R52 Pain, unspecified: Secondary | ICD-10-CM | POA: Diagnosis not present

## 2014-07-02 DIAGNOSIS — I10 Essential (primary) hypertension: Secondary | ICD-10-CM | POA: Diagnosis not present

## 2014-07-02 DIAGNOSIS — C321 Malignant neoplasm of supraglottis: Secondary | ICD-10-CM | POA: Diagnosis not present

## 2014-07-02 DIAGNOSIS — Z79891 Long term (current) use of opiate analgesic: Secondary | ICD-10-CM | POA: Diagnosis not present

## 2014-07-02 DIAGNOSIS — K5900 Constipation, unspecified: Secondary | ICD-10-CM | POA: Diagnosis not present

## 2014-07-02 DIAGNOSIS — R1310 Dysphagia, unspecified: Secondary | ICD-10-CM | POA: Diagnosis not present

## 2014-07-02 DIAGNOSIS — K1230 Oral mucositis (ulcerative), unspecified: Secondary | ICD-10-CM | POA: Diagnosis not present

## 2014-07-02 DIAGNOSIS — Z9981 Dependence on supplemental oxygen: Secondary | ICD-10-CM | POA: Diagnosis not present

## 2014-07-02 DIAGNOSIS — Z931 Gastrostomy status: Secondary | ICD-10-CM | POA: Diagnosis not present

## 2014-07-02 DIAGNOSIS — E079 Disorder of thyroid, unspecified: Secondary | ICD-10-CM | POA: Diagnosis not present

## 2014-07-02 DIAGNOSIS — C140 Malignant neoplasm of pharynx, unspecified: Secondary | ICD-10-CM | POA: Diagnosis not present

## 2014-07-02 DIAGNOSIS — Z7982 Long term (current) use of aspirin: Secondary | ICD-10-CM | POA: Diagnosis not present

## 2014-07-02 DIAGNOSIS — C051 Malignant neoplasm of soft palate: Secondary | ICD-10-CM | POA: Diagnosis not present

## 2014-07-02 DIAGNOSIS — C109 Malignant neoplasm of oropharynx, unspecified: Secondary | ICD-10-CM | POA: Diagnosis not present

## 2014-07-02 DIAGNOSIS — Z87891 Personal history of nicotine dependence: Secondary | ICD-10-CM | POA: Diagnosis not present

## 2014-07-02 DIAGNOSIS — Z930 Tracheostomy status: Secondary | ICD-10-CM | POA: Diagnosis not present

## 2014-07-02 DIAGNOSIS — Z9889 Other specified postprocedural states: Secondary | ICD-10-CM | POA: Diagnosis not present

## 2014-07-02 DIAGNOSIS — E039 Hypothyroidism, unspecified: Secondary | ICD-10-CM | POA: Diagnosis not present

## 2014-07-02 DIAGNOSIS — I509 Heart failure, unspecified: Secondary | ICD-10-CM | POA: Diagnosis not present

## 2014-07-02 DIAGNOSIS — B370 Candidal stomatitis: Secondary | ICD-10-CM | POA: Diagnosis not present

## 2014-07-02 DIAGNOSIS — Z79899 Other long term (current) drug therapy: Secondary | ICD-10-CM | POA: Diagnosis not present

## 2014-07-03 DIAGNOSIS — C051 Malignant neoplasm of soft palate: Secondary | ICD-10-CM | POA: Diagnosis not present

## 2014-07-03 DIAGNOSIS — Z510 Encounter for antineoplastic radiation therapy: Secondary | ICD-10-CM | POA: Diagnosis not present

## 2014-07-03 DIAGNOSIS — K861 Other chronic pancreatitis: Secondary | ICD-10-CM | POA: Diagnosis not present

## 2014-07-03 DIAGNOSIS — R1310 Dysphagia, unspecified: Secondary | ICD-10-CM | POA: Diagnosis not present

## 2014-07-03 DIAGNOSIS — E43 Unspecified severe protein-calorie malnutrition: Secondary | ICD-10-CM | POA: Diagnosis not present

## 2014-07-03 DIAGNOSIS — C321 Malignant neoplasm of supraglottis: Secondary | ICD-10-CM | POA: Diagnosis not present

## 2014-07-03 DIAGNOSIS — C140 Malignant neoplasm of pharynx, unspecified: Secondary | ICD-10-CM | POA: Diagnosis not present

## 2014-07-03 DIAGNOSIS — I5022 Chronic systolic (congestive) heart failure: Secondary | ICD-10-CM | POA: Diagnosis not present

## 2014-07-06 DIAGNOSIS — C140 Malignant neoplasm of pharynx, unspecified: Secondary | ICD-10-CM | POA: Diagnosis not present

## 2014-07-06 DIAGNOSIS — Z510 Encounter for antineoplastic radiation therapy: Secondary | ICD-10-CM | POA: Diagnosis not present

## 2014-07-06 DIAGNOSIS — C051 Malignant neoplasm of soft palate: Secondary | ICD-10-CM | POA: Diagnosis not present

## 2014-07-07 DIAGNOSIS — Z510 Encounter for antineoplastic radiation therapy: Secondary | ICD-10-CM | POA: Diagnosis not present

## 2014-07-07 DIAGNOSIS — C051 Malignant neoplasm of soft palate: Secondary | ICD-10-CM | POA: Diagnosis not present

## 2014-07-07 DIAGNOSIS — C140 Malignant neoplasm of pharynx, unspecified: Secondary | ICD-10-CM | POA: Diagnosis not present

## 2014-07-08 DIAGNOSIS — C140 Malignant neoplasm of pharynx, unspecified: Secondary | ICD-10-CM | POA: Diagnosis not present

## 2014-07-08 DIAGNOSIS — C051 Malignant neoplasm of soft palate: Secondary | ICD-10-CM | POA: Diagnosis not present

## 2014-07-08 DIAGNOSIS — Z510 Encounter for antineoplastic radiation therapy: Secondary | ICD-10-CM | POA: Diagnosis not present

## 2014-07-09 DIAGNOSIS — C051 Malignant neoplasm of soft palate: Secondary | ICD-10-CM | POA: Diagnosis not present

## 2014-07-09 DIAGNOSIS — Z510 Encounter for antineoplastic radiation therapy: Secondary | ICD-10-CM | POA: Diagnosis not present

## 2014-07-09 DIAGNOSIS — C321 Malignant neoplasm of supraglottis: Secondary | ICD-10-CM | POA: Diagnosis not present

## 2014-07-09 DIAGNOSIS — C140 Malignant neoplasm of pharynx, unspecified: Secondary | ICD-10-CM | POA: Diagnosis not present

## 2014-07-10 DIAGNOSIS — C051 Malignant neoplasm of soft palate: Secondary | ICD-10-CM | POA: Diagnosis not present

## 2014-07-10 DIAGNOSIS — R1310 Dysphagia, unspecified: Secondary | ICD-10-CM | POA: Diagnosis not present

## 2014-07-10 DIAGNOSIS — K861 Other chronic pancreatitis: Secondary | ICD-10-CM | POA: Diagnosis not present

## 2014-07-10 DIAGNOSIS — C140 Malignant neoplasm of pharynx, unspecified: Secondary | ICD-10-CM | POA: Diagnosis not present

## 2014-07-10 DIAGNOSIS — E43 Unspecified severe protein-calorie malnutrition: Secondary | ICD-10-CM | POA: Diagnosis not present

## 2014-07-10 DIAGNOSIS — Z510 Encounter for antineoplastic radiation therapy: Secondary | ICD-10-CM | POA: Diagnosis not present

## 2014-07-10 DIAGNOSIS — C321 Malignant neoplasm of supraglottis: Secondary | ICD-10-CM | POA: Diagnosis not present

## 2014-07-10 DIAGNOSIS — I5022 Chronic systolic (congestive) heart failure: Secondary | ICD-10-CM | POA: Diagnosis not present

## 2014-07-15 DIAGNOSIS — E039 Hypothyroidism, unspecified: Secondary | ICD-10-CM | POA: Diagnosis present

## 2014-07-15 DIAGNOSIS — E43 Unspecified severe protein-calorie malnutrition: Secondary | ICD-10-CM | POA: Diagnosis present

## 2014-07-15 DIAGNOSIS — Z931 Gastrostomy status: Secondary | ICD-10-CM | POA: Diagnosis not present

## 2014-07-15 DIAGNOSIS — R1310 Dysphagia, unspecified: Secondary | ICD-10-CM | POA: Diagnosis not present

## 2014-07-15 DIAGNOSIS — D6481 Anemia due to antineoplastic chemotherapy: Secondary | ICD-10-CM | POA: Diagnosis present

## 2014-07-15 DIAGNOSIS — A419 Sepsis, unspecified organism: Secondary | ICD-10-CM | POA: Diagnosis not present

## 2014-07-15 DIAGNOSIS — C051 Malignant neoplasm of soft palate: Secondary | ICD-10-CM | POA: Diagnosis present

## 2014-07-15 DIAGNOSIS — Z930 Tracheostomy status: Secondary | ICD-10-CM | POA: Diagnosis not present

## 2014-07-15 DIAGNOSIS — E860 Dehydration: Secondary | ICD-10-CM | POA: Diagnosis not present

## 2014-07-15 DIAGNOSIS — T451X5A Adverse effect of antineoplastic and immunosuppressive drugs, initial encounter: Secondary | ICD-10-CM | POA: Diagnosis present

## 2014-07-15 DIAGNOSIS — D701 Agranulocytosis secondary to cancer chemotherapy: Secondary | ICD-10-CM | POA: Diagnosis present

## 2014-07-15 DIAGNOSIS — I10 Essential (primary) hypertension: Secondary | ICD-10-CM | POA: Diagnosis present

## 2014-07-15 DIAGNOSIS — D709 Neutropenia, unspecified: Secondary | ICD-10-CM | POA: Insufficient documentation

## 2014-07-15 DIAGNOSIS — K861 Other chronic pancreatitis: Secondary | ICD-10-CM | POA: Diagnosis present

## 2014-07-15 DIAGNOSIS — Z7982 Long term (current) use of aspirin: Secondary | ICD-10-CM | POA: Diagnosis not present

## 2014-07-15 DIAGNOSIS — Z7901 Long term (current) use of anticoagulants: Secondary | ICD-10-CM | POA: Diagnosis not present

## 2014-07-15 DIAGNOSIS — R110 Nausea: Secondary | ICD-10-CM | POA: Diagnosis not present

## 2014-07-15 DIAGNOSIS — K5900 Constipation, unspecified: Secondary | ICD-10-CM | POA: Diagnosis present

## 2014-07-15 DIAGNOSIS — K1230 Oral mucositis (ulcerative), unspecified: Secondary | ICD-10-CM | POA: Diagnosis not present

## 2014-07-15 DIAGNOSIS — Z87891 Personal history of nicotine dependence: Secondary | ICD-10-CM | POA: Diagnosis not present

## 2014-07-15 DIAGNOSIS — B370 Candidal stomatitis: Secondary | ICD-10-CM | POA: Diagnosis present

## 2014-07-15 DIAGNOSIS — K1231 Oral mucositis (ulcerative) due to antineoplastic therapy: Secondary | ICD-10-CM | POA: Diagnosis present

## 2014-07-15 DIAGNOSIS — R509 Fever, unspecified: Secondary | ICD-10-CM | POA: Diagnosis not present

## 2014-07-15 DIAGNOSIS — F329 Major depressive disorder, single episode, unspecified: Secondary | ICD-10-CM | POA: Diagnosis present

## 2014-07-15 DIAGNOSIS — G893 Neoplasm related pain (acute) (chronic): Secondary | ICD-10-CM | POA: Diagnosis present

## 2014-07-15 DIAGNOSIS — I509 Heart failure, unspecified: Secondary | ICD-10-CM | POA: Diagnosis present

## 2014-07-15 DIAGNOSIS — K7460 Unspecified cirrhosis of liver: Secondary | ICD-10-CM | POA: Diagnosis present

## 2014-07-15 DIAGNOSIS — J45909 Unspecified asthma, uncomplicated: Secondary | ICD-10-CM | POA: Diagnosis present

## 2014-07-15 DIAGNOSIS — J449 Chronic obstructive pulmonary disease, unspecified: Secondary | ICD-10-CM | POA: Diagnosis present

## 2014-07-15 DIAGNOSIS — Z9981 Dependence on supplemental oxygen: Secondary | ICD-10-CM | POA: Diagnosis not present

## 2014-07-20 ENCOUNTER — Other Ambulatory Visit: Payer: Self-pay | Admitting: Family Medicine

## 2014-07-20 MED ORDER — LEVOTHYROXINE SODIUM 175 MCG PO TABS: 175.0000 ug | ORAL_TABLET | Freq: Every day | ORAL | Status: DC

## 2014-07-22 DIAGNOSIS — E43 Unspecified severe protein-calorie malnutrition: Secondary | ICD-10-CM | POA: Diagnosis not present

## 2014-07-22 DIAGNOSIS — K861 Other chronic pancreatitis: Secondary | ICD-10-CM | POA: Diagnosis not present

## 2014-07-22 DIAGNOSIS — R1310 Dysphagia, unspecified: Secondary | ICD-10-CM | POA: Diagnosis not present

## 2014-07-22 DIAGNOSIS — C140 Malignant neoplasm of pharynx, unspecified: Secondary | ICD-10-CM | POA: Diagnosis not present

## 2014-07-22 DIAGNOSIS — I5022 Chronic systolic (congestive) heart failure: Secondary | ICD-10-CM | POA: Diagnosis not present

## 2014-07-22 DIAGNOSIS — C321 Malignant neoplasm of supraglottis: Secondary | ICD-10-CM | POA: Diagnosis not present

## 2014-07-23 DIAGNOSIS — C051 Malignant neoplasm of soft palate: Secondary | ICD-10-CM | POA: Diagnosis not present

## 2014-07-24 DIAGNOSIS — E43 Unspecified severe protein-calorie malnutrition: Secondary | ICD-10-CM | POA: Diagnosis not present

## 2014-07-24 DIAGNOSIS — C321 Malignant neoplasm of supraglottis: Secondary | ICD-10-CM | POA: Diagnosis not present

## 2014-07-24 DIAGNOSIS — R1310 Dysphagia, unspecified: Secondary | ICD-10-CM | POA: Diagnosis not present

## 2014-07-24 DIAGNOSIS — K861 Other chronic pancreatitis: Secondary | ICD-10-CM | POA: Diagnosis not present

## 2014-07-24 DIAGNOSIS — C140 Malignant neoplasm of pharynx, unspecified: Secondary | ICD-10-CM | POA: Diagnosis not present

## 2014-07-24 DIAGNOSIS — I5022 Chronic systolic (congestive) heart failure: Secondary | ICD-10-CM | POA: Diagnosis not present

## 2014-07-28 ENCOUNTER — Ambulatory Visit
Admission: RE | Admit: 2014-07-28 | Discharge: 2014-07-28 | Disposition: A | Discharge status: Home / Self Care | Payer: Medicare Other | Source: Ambulatory Visit | Attending: Family Medicine | Admitting: Family Medicine

## 2014-07-28 ENCOUNTER — Encounter: Payer: Self-pay | Admitting: Family Medicine

## 2014-07-28 ENCOUNTER — Other Ambulatory Visit: Payer: Self-pay | Admitting: Family Medicine

## 2014-07-28 ENCOUNTER — Ambulatory Visit (INDEPENDENT_AMBULATORY_CARE_PROVIDER_SITE_OTHER): Payer: Medicare Other | Admitting: Family Medicine

## 2014-07-28 VITALS — BP 106/60 | HR 116 | Temp 98.2°F | Wt 114.4 lb

## 2014-07-28 DIAGNOSIS — I5022 Chronic systolic (congestive) heart failure: Secondary | ICD-10-CM | POA: Diagnosis not present

## 2014-07-28 DIAGNOSIS — R1310 Dysphagia, unspecified: Secondary | ICD-10-CM | POA: Diagnosis not present

## 2014-07-28 DIAGNOSIS — C140 Malignant neoplasm of pharynx, unspecified: Secondary | ICD-10-CM | POA: Diagnosis not present

## 2014-07-28 DIAGNOSIS — K861 Other chronic pancreatitis: Secondary | ICD-10-CM | POA: Diagnosis not present

## 2014-07-28 DIAGNOSIS — E43 Unspecified severe protein-calorie malnutrition: Secondary | ICD-10-CM | POA: Diagnosis not present

## 2014-07-28 DIAGNOSIS — R1013 Epigastric pain: Secondary | ICD-10-CM

## 2014-07-28 DIAGNOSIS — R1030 Lower abdominal pain, unspecified: Secondary | ICD-10-CM | POA: Diagnosis not present

## 2014-07-28 DIAGNOSIS — Z4682 Encounter for fitting and adjustment of non-vascular catheter: Secondary | ICD-10-CM | POA: Diagnosis not present

## 2014-07-28 DIAGNOSIS — R2681 Unsteadiness on feet: Secondary | ICD-10-CM | POA: Diagnosis not present

## 2014-07-28 DIAGNOSIS — I951 Orthostatic hypotension: Secondary | ICD-10-CM | POA: Diagnosis not present

## 2014-07-28 DIAGNOSIS — C321 Malignant neoplasm of supraglottis: Secondary | ICD-10-CM | POA: Diagnosis not present

## 2014-07-28 MED ORDER — LEVOTHYROXINE SODIUM 175 MCG PO TABS: 175.0000 ug | ORAL_TABLET | Freq: Every day | ORAL | Status: DC

## 2014-07-28 NOTE — Patient Instructions (Signed)
Good to see you.  The g tube site looks clean. Continue keeping it clean and seek reevaluation if it gets red or puts out lots of pus or you have fevers.  For the abdominal pain, we are getting a lab and xray today and I will call if NOT normal. Have about 16 more ounces daily of water. Be sure to have regular g tube meals, as you are dehydrated today.  Best,  Hilton Sinclair, MD

## 2014-07-28 NOTE — Assessment & Plan Note (Signed)
Report of dizziness in a deconditioned patient with severe malnutrition hx, hypotension hx, and laryngeal cancer, concern for gait instability and potential for falls. Mild dehydration today with tachycardia to 110s and low blood pressure, consistent over the past 2-3 visits. Weight up 7 pounds from last month. Recent admission March was for syncope and workup concluded pt had orthostasis, so bisoprolol was discontinued. Suppressed TSH in hospital suggested supratherapeutic synthroid dosing so dose decreased to 159mcg. -Rolling walker with seat ordered. -Continue holding blood pressure medicines. -Increase water intake through G-tube and continue regular feeds. -Refilled synthroid 154mcg daily. -F/u 2-3 weeks or sooner PRN if consistent or worsening dizziness. At that time, recheck TSH as this was not done today.

## 2014-07-28 NOTE — Assessment & Plan Note (Signed)
Drainage from G-tube site that appears to be normal drainage from granulation tissue. No surrounding erythema or sign of infection. Abdominal pain with mild distention but low risk for obstruction with regular daily bowel movements. However, high risk with current malignancy for metastasis. Weight up 7 lbs from last visit. -Abdominal x-ray, CMP, lipase. If normal, consider CT abdomen. -Increase with 16 more ounces of water daily and regular G-tube feeds as patient appears mildly dehydrated today. -Pt seen/examined with Dr Lindell Noe.

## 2014-07-28 NOTE — Progress Notes (Signed)
[name], male   DOB: 25-Mar-1950, 64 y.o.   MRN: [id number] Subjective:   CC: Abdominal pain and discharge from G-tube site  HPI:   G tube drainage Patient presents for same day evaluation for abdominal pain and drainage from G-tube site. He and granddaughter report that for the past week and a half, he has had consistent drainage. This was evaluated twice in April and deemed to not have an infection. G-tube was placed in early March. G-tube is working well per granddaughter  Abdominal pain He has had abdominal pain for around the same amount of time with no fevers or chills. Pain is mostly around his umbilicus and is 9/97. He is also had nausea and dry heaving for 2 weeks. Denies diarrhea, increasing gas, change in G-tube feeds, or change in medications. He is having regular solid bowel movements. He does report getting dizzy when standing up. He gets 32-48 ounces of water daily through the G-tube. He has stopped taking blood pressure medications.   Needs synthroid refill Per granddaughter, unable to pick up synthroid with pharmacy stating they did not have an rx even though it appears Dr Raoul Pitch recently ordered this.  Review of Systems - Per HPI.   PMH - laryngeal cancer, HTN, pulm nodule, hypothyrtoidism, chronic respiratory failure, systolic CHF, decreased appetite, alcohol dependence, elevated Cr, weight loss, pancreatitis chronic, adrenal insufficiency, HLD, OA, anorexia/severe malnutrition, odynophagia, esophageal mass  Objective:  Physical Exam BP 106/60 mmHg  Pulse 116  Temp(Src) 98.2 F (36.8 C) (Oral)  Wt 114 lb 6 oz (51.88 kg) GEN: NAD PULM: Normal effort ABD: soft, mildly distended and mild tenderness epigastrically with no rebound or guarding; NABS; g tube in place with no surrounding erythema or induration. Mild mucopurulent material from granulation tissue; no tenderness at this site EXTR: Very thin NEURO: Moves all extremities spontaneously; able to  get up to standing and transfer to wheelchair with assistance from 2 people SKIN: No rash or cyanosis  Assessment:     [name] is a 64 y.o. male here for evaluation of discharge from g tube site and abdominal pain.    Plan:     # See problem list and after visit summary for problem-specific plans.   Follow-up: Follow up in 2-3 weeks for f/u of abdominal pain and g-tube site.   Nathaniel Sinclair, MD [place]

## 2014-07-29 ENCOUNTER — Observation Stay (HOSPITAL_COMMUNITY)
Admission: AD | Admit: 2014-07-29 | Discharge: 2014-08-01 | Disposition: A | Discharge status: Home / Self Care | Payer: Medicare Other | Source: Ambulatory Visit | Attending: Family Medicine | Admitting: Family Medicine

## 2014-07-29 ENCOUNTER — Encounter: Payer: Self-pay | Admitting: Family Medicine

## 2014-07-29 ENCOUNTER — Telehealth: Payer: Self-pay | Admitting: Family Medicine

## 2014-07-29 DIAGNOSIS — E039 Hypothyroidism, unspecified: Secondary | ICD-10-CM | POA: Diagnosis present

## 2014-07-29 DIAGNOSIS — R1033 Periumbilical pain: Principal | ICD-10-CM | POA: Insufficient documentation

## 2014-07-29 DIAGNOSIS — Z7982 Long term (current) use of aspirin: Secondary | ICD-10-CM | POA: Diagnosis not present

## 2014-07-29 DIAGNOSIS — Z85818 Personal history of malignant neoplasm of other sites of lip, oral cavity, and pharynx: Secondary | ICD-10-CM | POA: Insufficient documentation

## 2014-07-29 DIAGNOSIS — K2289 Other specified disease of esophagus: Secondary | ICD-10-CM | POA: Diagnosis present

## 2014-07-29 DIAGNOSIS — Z9049 Acquired absence of other specified parts of digestive tract: Secondary | ICD-10-CM | POA: Insufficient documentation

## 2014-07-29 DIAGNOSIS — G893 Neoplasm related pain (acute) (chronic): Secondary | ICD-10-CM | POA: Insufficient documentation

## 2014-07-29 DIAGNOSIS — R000 Tachycardia, unspecified: Secondary | ICD-10-CM | POA: Diagnosis not present

## 2014-07-29 DIAGNOSIS — I509 Heart failure, unspecified: Secondary | ICD-10-CM | POA: Diagnosis not present

## 2014-07-29 DIAGNOSIS — I10 Essential (primary) hypertension: Secondary | ICD-10-CM | POA: Diagnosis not present

## 2014-07-29 DIAGNOSIS — R1013 Epigastric pain: Secondary | ICD-10-CM | POA: Diagnosis present

## 2014-07-29 DIAGNOSIS — E785 Hyperlipidemia, unspecified: Secondary | ICD-10-CM | POA: Diagnosis present

## 2014-07-29 DIAGNOSIS — I951 Orthostatic hypotension: Secondary | ICD-10-CM | POA: Diagnosis present

## 2014-07-29 DIAGNOSIS — R630 Anorexia: Secondary | ICD-10-CM | POA: Diagnosis present

## 2014-07-29 DIAGNOSIS — Z79899 Other long term (current) drug therapy: Secondary | ICD-10-CM | POA: Insufficient documentation

## 2014-07-29 DIAGNOSIS — E43 Unspecified severe protein-calorie malnutrition: Secondary | ICD-10-CM | POA: Diagnosis present

## 2014-07-29 DIAGNOSIS — F1020 Alcohol dependence, uncomplicated: Secondary | ICD-10-CM | POA: Diagnosis present

## 2014-07-29 DIAGNOSIS — G40909 Epilepsy, unspecified, not intractable, without status epilepticus: Secondary | ICD-10-CM | POA: Insufficient documentation

## 2014-07-29 DIAGNOSIS — M1990 Unspecified osteoarthritis, unspecified site: Secondary | ICD-10-CM | POA: Diagnosis not present

## 2014-07-29 DIAGNOSIS — I5022 Chronic systolic (congestive) heart failure: Secondary | ICD-10-CM | POA: Diagnosis present

## 2014-07-29 DIAGNOSIS — C321 Malignant neoplasm of supraglottis: Secondary | ICD-10-CM | POA: Diagnosis present

## 2014-07-29 DIAGNOSIS — I959 Hypotension, unspecified: Secondary | ICD-10-CM | POA: Insufficient documentation

## 2014-07-29 DIAGNOSIS — Z87891 Personal history of nicotine dependence: Secondary | ICD-10-CM | POA: Insufficient documentation

## 2014-07-29 DIAGNOSIS — E271 Primary adrenocortical insufficiency: Secondary | ICD-10-CM | POA: Diagnosis present

## 2014-07-29 DIAGNOSIS — K228 Other specified diseases of esophagus: Secondary | ICD-10-CM | POA: Diagnosis present

## 2014-07-29 DIAGNOSIS — E2749 Other adrenocortical insufficiency: Secondary | ICD-10-CM

## 2014-07-29 DIAGNOSIS — E2740 Unspecified adrenocortical insufficiency: Secondary | ICD-10-CM | POA: Insufficient documentation

## 2014-07-29 DIAGNOSIS — R109 Unspecified abdominal pain: Secondary | ICD-10-CM | POA: Insufficient documentation

## 2014-07-29 DIAGNOSIS — C329 Malignant neoplasm of larynx, unspecified: Secondary | ICD-10-CM | POA: Diagnosis present

## 2014-07-29 LAB — COMPREHENSIVE METABOLIC PANEL
ALBUMIN: 3.4 g/dL — AB (ref 3.5–5.2)
ALT: 79 U/L — ABNORMAL HIGH (ref 0–53)
AST: 46 U/L — AB (ref 0–37)
Alkaline Phosphatase: 347 U/L — ABNORMAL HIGH (ref 39–117)
BILIRUBIN TOTAL: 0.4 mg/dL (ref 0.2–1.2)
BUN: 22 mg/dL (ref 6–23)
CHLORIDE: 97 meq/L (ref 96–112)
CO2: 24 mEq/L (ref 19–32)
CREATININE: 0.65 mg/dL (ref 0.50–1.35)
Calcium: 9.1 mg/dL (ref 8.4–10.5)
Glucose, Bld: 96 mg/dL (ref 70–99)
Potassium: 4.9 mEq/L (ref 3.5–5.3)
Sodium: 133 mEq/L — ABNORMAL LOW (ref 135–145)
Total Protein: 7.9 g/dL (ref 6.0–8.3)

## 2014-07-29 LAB — CBC
HEMATOCRIT: 26 % — AB (ref 39.0–52.0)
HEMOGLOBIN: 8.4 g/dL — AB (ref 13.0–17.0)
MCH: 27.5 pg (ref 26.0–34.0)
MCHC: 32.3 g/dL (ref 30.0–36.0)
MCV: 85.2 fL (ref 78.0–100.0)
PLATELETS: 392 10*3/uL (ref 150–400)
RBC: 3.05 MIL/uL — AB (ref 4.22–5.81)
RDW: 17.3 % — ABNORMAL HIGH (ref 11.5–15.5)
WBC: 7.4 10*3/uL (ref 4.0–10.5)

## 2014-07-29 LAB — GAMMA GT: GGT: 180 U/L — ABNORMAL HIGH (ref 7–50)

## 2014-07-29 LAB — LIPASE: LIPASE: 28 U/L (ref 0–75)

## 2014-07-29 MED ORDER — LEVOTHYROXINE SODIUM 100 MCG IV SOLR
100.0000 ug | Freq: Every day | INTRAVENOUS | Status: DC
  Filled 2014-07-29 (×2): qty 5

## 2014-07-29 MED ORDER — SODIUM CHLORIDE 0.9 % IV BOLUS (SEPSIS)
250.0000 mL | Freq: Once | INTRAVENOUS | Status: AC
  Administered 2014-07-29: 250 mL via INTRAVENOUS

## 2014-07-29 MED ORDER — LORAZEPAM 0.5 MG PO TABS: 0.5000 mg | ORAL_TABLET | Freq: Four times a day (QID) | ORAL | Status: DC | PRN

## 2014-07-29 MED ORDER — ONDANSETRON HCL 4 MG/2ML IJ SOLN
4.0000 mg | Freq: Four times a day (QID) | INTRAMUSCULAR | Status: DC | PRN
  Administered 2014-07-30: 4 mg via INTRAVENOUS
  Filled 2014-07-29: qty 2

## 2014-07-29 MED ORDER — MORPHINE SULFATE 2 MG/ML IJ SOLN
2.0000 mg | INTRAMUSCULAR | Status: DC | PRN
Start: 2014-07-29 — End: 2014-07-30
  Administered 2014-07-29 – 2014-07-30 (×2): 2 mg via INTRAVENOUS
  Filled 2014-07-29 (×2): qty 1

## 2014-07-29 MED ORDER — LORAZEPAM 2 MG/ML IJ SOLN: 0.5000 mg | Freq: Four times a day (QID) | INTRAMUSCULAR | Status: DC | PRN

## 2014-07-29 MED ORDER — HEPARIN SODIUM (PORCINE) 5000 UNIT/ML IJ SOLN
5000.0000 [IU] | Freq: Three times a day (TID) | INTRAMUSCULAR | Status: DC
  Administered 2014-07-29 – 2014-07-31 (×7): 5000 [IU] via SUBCUTANEOUS
  Filled 2014-07-29 (×10): qty 1

## 2014-07-29 MED ORDER — IOHEXOL 300 MG/ML  SOLN
25.0000 mL | INTRAMUSCULAR | Status: AC
  Administered 2014-07-29 (×2): 25 mL via ORAL

## 2014-07-29 MED ORDER — THIAMINE HCL 100 MG/ML IJ SOLN
100.0000 mg | Freq: Every day | INTRAMUSCULAR | Status: DC
  Administered 2014-07-29: 100 mg via INTRAVENOUS
  Filled 2014-07-29 (×5): qty 1

## 2014-07-29 MED ORDER — VITAMIN B-1 100 MG PO TABS
100.0000 mg | ORAL_TABLET | Freq: Every day | ORAL | Status: DC
  Administered 2014-07-30 – 2014-08-01 (×3): 100 mg via ORAL
  Filled 2014-07-29 (×4): qty 1

## 2014-07-29 MED ORDER — FENTANYL 25 MCG/HR TD PT72: 25.0000 ug | MEDICATED_PATCH | TRANSDERMAL | Status: DC

## 2014-07-29 MED ORDER — SODIUM CHLORIDE 0.9 % IV BOLUS (SEPSIS)
500.0000 mL | Freq: Once | INTRAVENOUS | Status: AC
  Administered 2014-07-29: 500 mL via INTRAVENOUS

## 2014-07-29 MED ORDER — DEXTROSE-NACL 5-0.45 % IV SOLN
INTRAVENOUS | Status: DC
  Administered 2014-07-29: 19:00:00 via INTRAVENOUS

## 2014-07-29 NOTE — Telephone Encounter (Signed)
I have called his pharmacy and they state they do have refills on file for him for the correct dose of 18mcg. I have asked her to go ahead and get this ready for the family. I am not certain where the communication breakdown is surrounding this script. Please ask the daughter what pharmacy she is going to... As we may be calling it into a different pharmacy. His prescription for synthroid has been going to the [facility] [address]. If she would like this transferred to a different pharmacy she can have her pharmacy's do that for her. They will have it ready today there. Thanks.

## 2014-07-29 NOTE — Telephone Encounter (Signed)
Contacted bed control and pt has bed for 6E 19 and notified pt's daughter, [name]. Anthonymichael Munday, CMA.

## 2014-07-29 NOTE — Telephone Encounter (Addendum)
Lab work and abdominal xray reviewed. Concern for partial small bowel obstruction. In clinic, patient and his wife (over telephone) stated he was having regular daily bowel movements. Abdominal exam was non-acute with mild tenderness epigastrically and mild distention. NABS. However, with xray findings, will need further eval. -Ordering CT abdomen pelvis with contrast. CMA Latoya looking into scheduling; if unable to perform today, will call patient and strongly urge direct admission for further workup -Also given elevated alkaline phosphatase and mild elevated AST and ALT, will review CT scan and possibly perform RUQ ultrasound and hepatitis panel (negative 12/2011). -Precepted with Dr Gwendlyn Deutscher.  2:30pm - Attempted to call patient at both numbers listed but unable to reach anyone. Will try again.  3:05 PM - Spoke with daughter [name]. She is unsure if he has had a bowel movement today and he has been having regular bowel movements. He is still having "bad abdominal pain" today and states it has gotten worse. This is right around the umbilicus. -Informed her that I would follow up with information about either CT scan or direct admission. She is amenable to either.  3:25PM - no available appointment for CT scan today. Given worsening abdominal pain and inability to further image today outpatient, will opt for direct admission. -Called and discussed with daughter [name] who is amenable and will discuss with her father. -Informed inpatient team. -This admission, will perform CT abdomen pelvis with contrast, make patient NPO, IV hydration for partial small bowel obstruction, serial abdominal exams, and further workup elevated liver function tests. -Would also benefit from further palliative care discussions. - Blue Team, please notify [name] (mobile phone number listed) when bed available. I have already discussed with her to come to admitting when she hears from you.  Hilton Sinclair, MD

## 2014-07-29 NOTE — Telephone Encounter (Signed)
Cancelled CT abdomen/pelvis outpatient scheduled 5/26 at [facility] [place] with Jana Half who works at [facility] radiology (verbal order as unable to cancel electronically due to already being scheduled). Patient is hospitalized and getting this test done tonight at [facility].  Hilton Sinclair, MD

## 2014-07-29 NOTE — H&P (Signed)
[facility] Admission History and Physical Service Pager: [phone]  Patient name: [name] Medical record number: [id number] Date of birth: 07/20/1950 Age: 64 y.o. Gender: male  Primary Care Provider: Howard Pouch, DO Consultants: None Code Status: Full  Chief Complaint: Abdominal pain, PEG tube drainage  Assessment and Plan: [name] is a 64 y.o. male presenting with abdominal pain, distention, and PEG tube drainage for 1.5 weeks. PMH is significant for laryngeal carcinoma, SCC of mouth, PEG tube, HFpEF, hypothyroidism, chronic pancreatitis, ?cirrhosis, h/o alcohol abuse, HTN, and HLD.  Abdominal Pain / Distension. Abdomen distended on exam with mild tenderness, but no peritoneal signs. Plain film yesterday with air-fluid levels concerning for developing SBO vs ileus, though patient has continued to have bowel movements and has had no  vomiting. Can also consider intra-abdominal infectious process such as SBP, though patient has no fever. 1/4 SIRS criteria on admission (though no WBC available). qSOFA 0. Also consider metastases. Drainage from PEG site does not appear infective. - Admit to telemetry under attending Dr Andria Frames for observation - Will obtain CT abdomen/pelvis with IV and oral contrast (Cr normal) - NPO, gentle IV hydration - Serial abdominal exams - Low threshold to consult surgery if develops peritoneal signs - Repeat PEG site examination. WOC consulted.  Tachycardia / Hypotension. Noted to have persistent tachycardia for several months. Has been worked up for adrenal insufficiency at [facility] with low AM cortisol in 2015 - thought to be related to chronic megace use. May also be dehydrated secondary to poor intake. Not consistent with sepsis clinically but monitor for fever or WBC. Tachycardia may also be related to recent discontinuation of bisoprolol due to persistent hypotension. - Will check AM cortisol level - Will give 1L  bolus - Gentle IVF, D5 1/2 NS @ 75cc/hr for 12 hrs; monitor for signs of overload - Blood cultures x 2; empiric abx IF fever, elevated WBC, or other concern - Lactic acid  Elevated Transaminases and alk phos. AST 46, ALT 79 on day prior to admission. Likely secondary to cirrhosis vs poor perfusion with low BP. Concern for biliary etiology due to elevated alkaline phosphatase (347 on admission) and pain. Elevated alk phos likely related to history of carcinoma and recent radiation and chemotherapy.  Hepatitis panel in 2013 negative. - Repeat CMP tomorrow - Will obtain GGT - Consider RUQ [place] if elevated GGT, pending CT results  Laryngeal carcinoma / mouth SCC. Recently finished radiation and chemotherapy at [facility]. PEG tube placed 3 months ago due to difficulty feeding by mouth.  - holding home Megace while NPO - Continue home fentanyl patch - Needs to be replaced today - Holding home oxycodone while NPO - Morphine IV 47m q4hrs prn - Will call palliative care in the morning to clarify goals of care  Seizure-like activity. Patient worked up for this during admission 2 months ago. Thought to be likely due to hypotension. Work up at that time with MRI only significant for incidental sphenoid meningioma (which would not explain symptoms) and EEG without seizure activity.  - Continue to monitor - Holding home antihypertensives - PT/OT consulted. Walker ordered outpatient to decrease risk of falls.  Hypothyroidism. TSH 0.078 on 3/25. Home synthroid dose 2045m daily as per daughter this is most recent dose. - IV synthroid 10043mdaily while NPO  HFpEF / HTN / HLD. Echo in 04/2014 with EF 50-55% and grade 1 diastolic dysfunction. No lipid panel since 2014.  - Carefully monitor fluid  status - Holding home bisprolol in setting of hypotension.  - Consider starting statin and ASA therapy  Alcohol Abuse hx - CIWA - Thiamine  FEN/GI: NPO, D5 1/2NS @ 75 cc/hr Prophylaxis: SubQ  heparin  Disposition: Admitted pending above work up under attending Dr Andria Frames  History of Present Illness: EMILLIANO Solomon is a 64 y.o. male presenting as a direct admission with abdominal pain, distension, and plain film consistent with SBO vs ileus.  History obtained by patient and daughter [name]. Patient was seen in clinic yesterday. At that time reported abdominal pain with PEG tube drainage for about 1.5 weeks. Pain is centered around his umbilicus and rated as a 8/10. Pain has become more constant today. Reported that it was more difficult to receive tube feeds today and that was a little painful.   PEG discharge is green in color and has been going on for "a while." Was placed 3 months ago at [facility] Daughter worries about infection due to needing 2 surgeries to place PEG at [facility] and being told there was therefore higher risk of infection. Has been evaluated for infection a couple times last month and determined to not have an infection. Reports that he has been having normal daily bowel movements. Last BM was this morning and described as "a little hard." No fevers, chills, diarrhea, or constipation. No dysuria. No recent medication changes. No trauma. Reports that the PEG tube has been working well.  Also reports some "twitching and shaking episodes" that have been occuring nearly every day recently. Reports that the patient becomes weak and dizzy and then start shaking. Has some urinary incontinence with one of these episodes. Has been worked up for this in the past and determined to be due to orthostatic hypotension. Currently has home health PT.  Review Of Systems: Per HPI with the following additions: No chest pain or shortness of breath Otherwise 12 point review of systems was performed and was unremarkable.  Patient Active Problem List   Diagnosis Date Noted  . Abdominal pain 07/29/2014  . Abdominal pain, epigastric 07/28/2014  . Altered mental state   . SIRS (systemic  inflammatory response syndrome)   . Fever   . AKI (acute kidney injury)   . Throat cancer   . Esophageal mass   . Weight loss   . Odynophagia   . Palliative care encounter   . Severe malnutrition 04/10/2014  . Protein-calorie malnutrition, severe 04/10/2014  . Syncope 04/09/2014  . LOC (loss of consciousness) 04/09/2014  . Anorexia   . Pancreatitis, chronic 05/20/2013  . Adrenal insufficiency, primary, iatrogenic 05/20/2013  . Hyperlipidemia   . Osteoarthritis   . Hypotension 05/10/2013  . Dehydration 05/08/2013  . Loss of weight 03/12/2013  . Elevated serum creatinine 11/28/2012  . Chronic systolic heart failure 13/24/4010  . Alcohol dependence 01/04/2012  . Decreased appetite 01/03/2012  . Acute and chronic respiratory failure 12/26/2011  . Acute systolic HF (heart failure) 12/26/2011  . 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 Hypothyroidism 10/16/2010  . Malignant neoplasm of larynx 01/10/2010  . PULMONARY NODULE 12/31/2007  . Essential hypertension, benign 11/08/2007   Past Medical History: Past Medical History  Diagnosis Date  . Hypertension   . Cirrhosis of liver   . Hypothyroidism   . Alcohol abuse   . CHF (congestive heart failure)     EF 45-50% 05/2012  . Hyperlipidemia   . Arthritis   . Laryngeal cancer 2011  Laryngectomy, [id number]  . Seizures    Past Surgical History: Past Surgical History  Procedure Laterality Date  . Tracheal surgery    . Tracheostomy    . Left and right heart catheterization with coronary angiogram N/A 12/28/2011    Procedure: LEFT AND RIGHT HEART CATHETERIZATION WITH CORONARY ANGIOGRAM;  Surgeon: Jolaine Artist, MD;  Location: [facility] CATH LAB;  Service: Cardiovascular;  Laterality: N/A;  . Esophagogastroduodenoscopy N/A 04/10/2014    Procedure: ESOPHAGOGASTRODUODENOSCOPY (EGD);  Surgeon: Missy Sabins, MD;  Location: [facility] ENDOSCOPY;  Service: Endoscopy;  Laterality: N/A;   Social  History: History  Substance Use Topics  . Smoking status: Former Smoker -- 0.20 packs/day for 50 years    Types: Cigarettes  . Smokeless tobacco: Never Used  . Alcohol Use: Yes     Comment: "rarely"   Please also refer to relevant sections of EMR.  Family History: Family History  Problem Relation Age of Onset  . Hyperlipidemia Mother   . Hypertension Mother   . Hypertension Father   . Hyperlipidemia Father    Allergies and Medications: Allergies  Allergen Reactions  . Vicodin [Hydrocodone-Acetaminophen] Rash  . Other     Unknown anesthesia medicine caused cardiac arrest.  . Tylenol [Acetaminophen]     HBP -Liver Cirrhosis   No current facility-administered medications on file prior to encounter.   Current Outpatient Prescriptions on File Prior to Encounter  Medication Sig Dispense Refill  . Alum & Mag Hydroxide-Simeth (MAGIC MOUTHWASH W/LIDOCAINE) SOLN Take 5 mLs by mouth 4 (four) times daily. 120 mL 0  . aspirin EC 81 MG tablet Take 81 mg by mouth daily.    . diclofenac sodium (VOLTAREN) 1 % GEL Apply 2 g topically 4 (four) times daily as needed (pain). (Patient not taking: Reported on 05/28/2014) 100 g 6  . fentaNYL (DURAGESIC - DOSED MCG/HR) 25 MCG/HR patch Place 1 patch (25 mcg total) onto the skin every 3 (three) days. 5 patch 0  . guaiFENesin (MUCINEX) 600 MG 12 hr tablet Take 1 tablet (600 mg total) by mouth 2 (two) times daily. 120 tablet 3  . levothyroxine (SYNTHROID, LEVOTHROID) 175 MCG tablet Take 1 tablet (175 mcg total) by mouth daily before breakfast. 90 tablet 0  . megestrol (MEGACE) 40 MG/ML suspension Take 10 mLs (400 mg total) by mouth 2 (two) times daily. 240 mL 2  . Nutritional Supplements (FEEDING SUPPLEMENT, JEVITY 1.2 CAL,) LIQD Place 65 mL/hr into feeding tube continuous. 1000 mL 0  . ondansetron (ZOFRAN) 4 MG tablet Take 1 tablet (4 mg total) by mouth every 8 (eight) hours as needed for nausea or vomiting. 60 tablet 0  . oxyCODONE (ROXICODONE) 5 MG/5ML  solution Take 5 mLs (5 mg total) by mouth every 4 (four) hours as needed for severe pain. 90 mL 0  . Water For Irrigation, Sterile (FREE WATER) SOLN Place 175 mLs into feeding tube 4 (four) times daily. 1000 mL 0    Objective: BP 108/79 mmHg  Pulse 111  Temp(Src) 98.7 F (37.1 C) (Oral)  Resp 16  SpO2 98% Exam: General: Cachectic 64 year old man in NAD, lying in hospital bed, appears older than stated age Eyes: EOMI, sclera clear, PERRLA ENTM: Tacky appearing mucus membranes, no obvious mass noted in oral cavity, no erythema or purulence Neck: Surgical scar well healing, tracheostomy site clean without signs of infection; hypopigmented Cardiovascular: Tachycardic, regular rate, no murmurs appreciated, 1+ bilateral radial pulses Respiratory: NWOB, CTAB, no wheezes or crackles Abdomen: PEG tube in  place with small amount of bilious drainage on dressing. Minimal surrounding erythema. +BS, distended, diffusely mildly tender to palpation, most significantly in LLQ and just around PEG site. No guarding or rebound. Tympanic to percussion. No organomegaly appreciated MSK: WWP, no cyanosis or effusions, thin extremities Skin: No rashes Neuro: Alert and interactive. Mouths words. No focal neurological deficits. Psych: Blunted / disinterested affect. No apparent AVH.   Labs and Imaging: BMET   Recent Labs Lab 07/28/14 1144  NA 133*  K 4.9  CL 97  CO2 24  BUN 22  CREATININE 0.65  GLUCOSE 96  CALCIUM 9.1     Dg Abd 2 Views  07/28/2014   CLINICAL DATA:  Lower abdominal pain 2 weeks. Gastrostomy tube placement for 1 year.  EXAM: ABDOMEN - 2 VIEW  COMPARISON:  05/10/2014  FINDINGS: Bowel gas pattern demonstrates air within the colon and small bowel. There is a suggestion of a few air-filled mildly dilated small bowel loops. There are a few nonspecific scattered air-fluid levels. No free peritoneal air. Evidence of patient's gastrostomy tube projected over the stomach in the left upper  quadrant.  Minimal patchy sclerosis over the femoral heads compatible known avascular necrosis.  IMPRESSION: Several air-filled minimally dilated small bowel loops with a few scattered air-fluid levels. Findings may be due to ileus versus early/partial small bowel obstruction. Recommend follow-up serial abdominal films versus CT.   Electronically Signed   By: Marin Olp M.D.   On: 07/28/2014 15:39    Vivi Barrack, MD 07/29/2014, 6:52 PM PGY-1, [place] Intern pager: [phone], text pages welcome  I have seen and examined patient with Dr Jerline Pain and agree with his assessment and plan with my changes in blue.  Hilton Sinclair, MD PGY-3, [place]

## 2014-07-29 NOTE — Progress Notes (Signed)
Admission note:  Arrival Method: wheelchair Mental Orientation: alert & oriented x 4  Telemetry: no ordered  Assessment: completed   Skin: intact  IV: right FA  Pain: facial grimacing   Tubes: clamped peg tube in center upper abdomen  Safety Measures: Patient Handbook has been given, and discussed the Fall Prevention worksheet. Left at bedside  Admission: Completed and admission orders have been written  6E Orientation: Patient has been oriented to the unit, staff and to the room.  Family: At the bedside; daughter    Marijean Heath BSN, RN [place] Phone [phone]

## 2014-07-29 NOTE — Progress Notes (Signed)
[name], male   DOB: [birth year], 64 y.o.   MRN: [id number] Dr Jule Economy precepted patient's result with me via telephone call.  1. Abdominal xray with possible ileus vs partial bowel obstruction in a patient she say yesterday with abdominal pain who was hemodynamically stable at the time of visit.    Radiology recommended serial xray vs CT abdomen to further assess patient.    While I agreed with CT abdomen I also recommended inpatient observation in a patient with a hx of abdominal and pain and an abnormal xray report.    I advised Dr Jule Economy to contact patient and check on how he is doing with is pain to further guide her decision making about hospitalization.  2. Liver enzyme increased from the last done 2 months ago in patient with hx of laryngeal cancer. ?? Metastasis to the Liver, will need Liver U/S at some point in addition to hepatitis panel. Dr T will arrange this or will be done if admitted to the hospital.

## 2014-07-30 ENCOUNTER — Encounter (HOSPITAL_COMMUNITY): Payer: Self-pay

## 2014-07-30 ENCOUNTER — Ambulatory Visit (HOSPITAL_COMMUNITY): Payer: Medicare Other

## 2014-07-30 ENCOUNTER — Observation Stay (HOSPITAL_COMMUNITY): Payer: Medicare Other

## 2014-07-30 ENCOUNTER — Telehealth: Payer: Self-pay | Admitting: Family Medicine

## 2014-07-30 DIAGNOSIS — Z931 Gastrostomy status: Secondary | ICD-10-CM | POA: Diagnosis not present

## 2014-07-30 DIAGNOSIS — R140 Abdominal distension (gaseous): Secondary | ICD-10-CM | POA: Diagnosis not present

## 2014-07-30 DIAGNOSIS — M1990 Unspecified osteoarthritis, unspecified site: Secondary | ICD-10-CM | POA: Diagnosis not present

## 2014-07-30 DIAGNOSIS — K868 Other specified diseases of pancreas: Secondary | ICD-10-CM | POA: Diagnosis not present

## 2014-07-30 DIAGNOSIS — R1013 Epigastric pain: Secondary | ICD-10-CM | POA: Diagnosis not present

## 2014-07-30 DIAGNOSIS — N2889 Other specified disorders of kidney and ureter: Secondary | ICD-10-CM | POA: Diagnosis not present

## 2014-07-30 DIAGNOSIS — C329 Malignant neoplasm of larynx, unspecified: Secondary | ICD-10-CM | POA: Diagnosis not present

## 2014-07-30 DIAGNOSIS — E2749 Other adrenocortical insufficiency: Secondary | ICD-10-CM | POA: Diagnosis not present

## 2014-07-30 DIAGNOSIS — Z515 Encounter for palliative care: Secondary | ICD-10-CM

## 2014-07-30 DIAGNOSIS — C321 Malignant neoplasm of supraglottis: Secondary | ICD-10-CM | POA: Diagnosis not present

## 2014-07-30 DIAGNOSIS — N200 Calculus of kidney: Secondary | ICD-10-CM | POA: Diagnosis not present

## 2014-07-30 DIAGNOSIS — I5022 Chronic systolic (congestive) heart failure: Secondary | ICD-10-CM

## 2014-07-30 DIAGNOSIS — R109 Unspecified abdominal pain: Secondary | ICD-10-CM | POA: Diagnosis not present

## 2014-07-30 DIAGNOSIS — R000 Tachycardia, unspecified: Secondary | ICD-10-CM | POA: Diagnosis not present

## 2014-07-30 DIAGNOSIS — E039 Hypothyroidism, unspecified: Secondary | ICD-10-CM | POA: Diagnosis not present

## 2014-07-30 DIAGNOSIS — I10 Essential (primary) hypertension: Secondary | ICD-10-CM | POA: Diagnosis not present

## 2014-07-30 DIAGNOSIS — G893 Neoplasm related pain (acute) (chronic): Secondary | ICD-10-CM | POA: Insufficient documentation

## 2014-07-30 DIAGNOSIS — R1033 Periumbilical pain: Secondary | ICD-10-CM | POA: Diagnosis not present

## 2014-07-30 DIAGNOSIS — I959 Hypotension, unspecified: Secondary | ICD-10-CM | POA: Diagnosis not present

## 2014-07-30 LAB — CBC
HCT: 23.7 % — ABNORMAL LOW (ref 39.0–52.0)
HEMOGLOBIN: 7.7 g/dL — AB (ref 13.0–17.0)
MCH: 27.4 pg (ref 26.0–34.0)
MCHC: 32.5 g/dL (ref 30.0–36.0)
MCV: 84.3 fL (ref 78.0–100.0)
Platelets: 341 10*3/uL (ref 150–400)
RBC: 2.81 MIL/uL — ABNORMAL LOW (ref 4.22–5.81)
RDW: 17.2 % — AB (ref 11.5–15.5)
WBC: 6.5 10*3/uL (ref 4.0–10.5)

## 2014-07-30 LAB — COMPREHENSIVE METABOLIC PANEL
ALBUMIN: 2.2 g/dL — AB (ref 3.5–5.0)
ALT: 54 U/L (ref 17–63)
AST: 32 U/L (ref 15–41)
Alkaline Phosphatase: 254 U/L — ABNORMAL HIGH (ref 38–126)
Anion gap: 10 (ref 5–15)
BUN: 16 mg/dL (ref 6–20)
CO2: 21 mmol/L — ABNORMAL LOW (ref 22–32)
Calcium: 8.5 mg/dL — ABNORMAL LOW (ref 8.9–10.3)
Chloride: 102 mmol/L (ref 101–111)
Creatinine, Ser: 0.66 mg/dL (ref 0.61–1.24)
GFR calc non Af Amer: >60 mL/min (ref >60)
GLUCOSE: 92 mg/dL (ref 65–99)
Potassium: 4.1 mmol/L (ref 3.5–5.1)
Sodium: 133 mmol/L — ABNORMAL LOW (ref 135–145)
Total Bilirubin: 0.5 mg/dL (ref 0.3–1.2)
Total Protein: 7.3 g/dL (ref 6.5–8.1)

## 2014-07-30 LAB — HEPATITIS PANEL, ACUTE
HCV Ab: NEGATIVE
HEP A IGM: NONREACTIVE
Hep B C IgM: NONREACTIVE
Hepatitis B Surface Ag: NEGATIVE

## 2014-07-30 LAB — URINALYSIS, ROUTINE W REFLEX MICROSCOPIC
BILIRUBIN URINE: NEGATIVE
Glucose, UA: NEGATIVE mg/dL
KETONES UR: NEGATIVE mg/dL
Leukocytes, UA: NEGATIVE
NITRITE: NEGATIVE
PH: 8 (ref 5.0–8.0)
Protein, ur: NEGATIVE mg/dL
Specific Gravity, Urine: 1.014 (ref 1.005–1.030)
Urobilinogen, UA: 0.2 mg/dL (ref 0.0–1.0)

## 2014-07-30 LAB — URINE MICROSCOPIC-ADD ON

## 2014-07-30 LAB — LACTIC ACID, PLASMA: LACTIC ACID, VENOUS: 1 mmol/L (ref 0.5–2.0)

## 2014-07-30 LAB — CORTISOL-AM, BLOOD: Cortisol - AM: 1.6 ug/dL — ABNORMAL LOW (ref 6.7–22.6)

## 2014-07-30 MED ORDER — LEVOTHYROXINE SODIUM 175 MCG PO TABS
175.0000 ug | ORAL_TABLET | Freq: Every day | ORAL | Status: DC
  Administered 2014-07-31 – 2014-08-01 (×2): 175 ug via ORAL
  Filled 2014-07-30 (×3): qty 1

## 2014-07-30 MED ORDER — TAMSULOSIN HCL 0.4 MG PO CAPS
0.4000 mg | ORAL_CAPSULE | Freq: Every day | ORAL | Status: DC
  Administered 2014-07-30 – 2014-08-01 (×3): 0.4 mg via ORAL
  Filled 2014-07-30 (×5): qty 1

## 2014-07-30 MED ORDER — FREE WATER
175.0000 mL | Freq: Four times a day (QID) | Status: DC
  Administered 2014-07-30 – 2014-08-01 (×8): 175 mL

## 2014-07-30 MED ORDER — FENTANYL 25 MCG/HR TD PT72
37.5000 ug | MEDICATED_PATCH | TRANSDERMAL | Status: DC
  Administered 2014-08-01: 37.5 ug via TRANSDERMAL
  Filled 2014-07-30 (×2): qty 1

## 2014-07-30 MED ORDER — OXYCODONE HCL 5 MG/5ML PO SOLN
5.0000 mg | ORAL | Status: DC | PRN
  Administered 2014-07-30 – 2014-08-01 (×7): 5 mg via ORAL
  Filled 2014-07-30 (×8): qty 5

## 2014-07-30 MED ORDER — JEVITY 1.2 CAL PO LIQD
1000.0000 mL | ORAL | Status: DC
  Administered 2014-07-30: 1000 mL
  Administered 2014-07-31: 70 mL/h
  Filled 2014-07-30 (×6): qty 1000

## 2014-07-30 MED ORDER — BISOPROLOL FUMARATE 10 MG PO TABS: 10.0000 mg | ORAL_TABLET | Freq: Every day | ORAL | Status: DC

## 2014-07-30 MED ORDER — SENNOSIDES 8.8 MG/5ML PO SYRP
5.0000 mL | ORAL_SOLUTION | Freq: Every day | ORAL | Status: DC
  Administered 2014-07-30 – 2014-07-31 (×2): 5 mL via ORAL
  Filled 2014-07-30 (×3): qty 5

## 2014-07-30 MED ORDER — BISOPROLOL FUMARATE 5 MG PO TABS
5.0000 mg | ORAL_TABLET | Freq: Every day | ORAL | Status: DC
  Administered 2014-07-31 – 2014-08-01 (×2): 5 mg via ORAL
  Filled 2014-07-30 (×3): qty 1

## 2014-07-30 MED ORDER — POLYETHYLENE GLYCOL 3350 17 G PO PACK
17.0000 g | PACK | Freq: Every day | ORAL | Status: DC
  Administered 2014-07-30: 17 g via ORAL
  Filled 2014-07-30 (×3): qty 1

## 2014-07-30 MED ORDER — IOHEXOL 300 MG/ML  SOLN
80.0000 mL | Freq: Once | INTRAMUSCULAR | Status: AC | PRN
  Administered 2014-07-30: 80 mL via INTRAVENOUS

## 2014-07-30 NOTE — Telephone Encounter (Signed)
This patient is currently an inpatient at [facility] and some changes have been made to his blood pressure medication, the daughter, [name] would like a phone call to receive clarification about this because he had been discontinued on his blood pressure medication and she was informed that they have increased the dosage. She just wants to ensure his safety. A member of the clinical staff or the PCP may call Mrs. Snipes at the number given. Thank you, Fonda Kinder, ASA

## 2014-07-30 NOTE — Progress Notes (Signed)
Family Medicine Teaching Service Daily Progress Note Intern Pager: [phone]  Patient name: [name] Medical record number: [id number] Date of birth: 07/11/1950 Age: 64 y.o. Gender: male  Primary Care Provider: Howard Pouch, DO Consultants: urology, palliative Code Status: FULL   Pt Overview and Major Events to Date:  05/25: admitted  Assessment and Plan: [name] is a 64 y.o. male presenting with abdominal pain, distention, and PEG tube drainage for 1.5 weeks. PMH is significant for laryngeal carcinoma, SCC of mouth, PEG tube, HFpEF, hypothyroidism, chronic pancreatitis, ?cirrhosis, h/o alcohol abuse, HTN, and HLD.  Abdominal Pain / Distension. Abdomen distended on exam with mild tenderness, but no peritoneal signs.  1/4 SIRS criteria on admission (though no WBC available). qSOFA 0. Also consider metastases. Drainage from PEG site does not appear infective. CT neg for SBO but does show dilatation of renal collecting systems R>L and asymmetric bladder wall thickening. PVR 153cc - telemetry - NPO, SLIV - Serial abdominal exams - c/s urology. Dr Risa Grill to see. - flomax started - PEG site, [place] consulted.  Tachycardia / Hypotension. Noted to have persistent tachycardia for several months. Has been worked up for adrenal insufficiency at [facility] with low AM cortisol in 2015 - thought to be related to chronic megace use.  Tachycardia may also be related to recent discontinuation of bisoprolol due to persistent hypotension.  Cortisol 1.6. LA 1.0 - ? Adrenal insufficiency vs 2/2 Megace.   - Will continue to monitor BP/HR.   - Will allow cortisol to increase independently for now, as patient recently stopped Megace (last week) - Will Consider Cortef 67m TID if persistent tachycardia/hypotension not responsive to gentle hydration/BB. - BP stable will restart bisoprolol at decreased dose of 538m as recently dc'd because of persistent hypotension (previously on 1081m- Blood  cultures x 2; empiric abx IF fever, elevated WBC, or other concern  Elevated Transaminases and alk phos. AST 46, ALT 79 on day prior to admission.  AST 32, ALT 54 today. Likely secondary to cirrhosis vs poor perfusion with low BP. ALK phos 347>254.  Elevated alk phos likely related to history of carcinoma and recent radiation and chemotherapy. Hepatitis panel in 2013 negative.  GGT 180 - Daily CMP - Consider RUQ u/s  Laryngeal carcinoma / mouth SCC. [facility] due to difficulty feeding by mouth.  - Hold megace. Apparently, last taken last week. - Resume tube feeds - Continue home fentanyl patch  - Resume home Oxy - c/s palliative care, to meet with daughter and patient again tomorrow for further GOC discussion  -Miralax and Senna  Seizure-like activity. Patient worked up for this during admission 2 months ago. Thought to be likely due to hypotension. Work up at that time with MRI only significant for incidental sphenoid meningioma (which would not explain symptoms) and EEG without seizure activity.  - Continue to monitor - Holding home antihypertensives - PT/OT consulted. Walker ordered outpatient to decrease risk of falls.  Hypothyroidism. TSH 0.078 on 3/25. Home synthroid dose 200m46maily as per daughter this is most recent dose. This was verified in care everywhere. TSH here 0.078 - IV synthroid 100mc55mily while NPO, would consider decreasing this dose.  HFpEF / HTN / HLD.  BP stable this am. Echo in 04/2014 with EF 50-55% and grade 1 diastolic dysfunction. No lipid panel since 2014.  - Carefully monitor fluid status - Holding home bisprolol in setting of hypotension.  -  Consider starting statin and ASA therapy  Severe protein malnutrition.  Albumin 3.4> 2.2 PEG tube in place -c/s nutrition -? Recurrence/mets of cancer  Alcohol Abuse hx - CIWA - Thiamine  FEN/GI: Resume tube feeds, D5 1/2NS @  75 cc/hr Prophylaxis: SubQ heparin  Disposition: Admitted pending above work up   Subjective:  Patient reports that he is doing ok.  He points to his peri-umbilical region as to where the pain is.  He states that he urinates several times daily.  Denies pain or blood in urine.  Denies ever being told he has BPH.  Objective: Temp:  [98.4 F (36.9 C)-98.7 F (37.1 C)] 98.4 F (36.9 C) (05/26 0439) Pulse Rate:  [106-117] 106 (05/26 0439) Resp:  [16-18] 18 (05/26 0439) BP: (108-114)/(75-79) 114/76 mmHg (05/26 0439) SpO2:  [97 %-98 %] 97 % (05/26 0439) Physical Exam: General: awake, alert, thin, NAD HEENT: EOMI, healed external 1/8"x1/8" opening at suprasternal notch, hypopigmentation of skin in this area as well Cardiovascular: RRR, no murmus Respiratory: CTAB, no increased WOB Abdomen: flat, soft, minimal guarding on L side of abdomen just below PEG site.  PEG site with minimal drainage on dressing.  No purulence appreciated.  No evidence of peritonitis.  +BS x4 GU: prostate exam performed, good rectal tone, moderately enlarged symmetric prostate appreciated with no palpable nodules. Extremities: Thin, warm, no bony deformities Skin: as above, no rashes Neuro: follows commands, no focal deficits  Laboratory:  Recent Labs Lab 07/29/14 1950 07/30/14 0534  WBC 7.4 6.5  HGB 8.4* 7.7*  HCT 26.0* 23.7*  PLT 392 341    Recent Labs Lab 07/28/14 1144 07/30/14 0534  NA 133* 133*  K 4.9 4.1  CL 97 102  CO2 24 21*  BUN 22 16  CREATININE 0.65 0.66  CALCIUM 9.1 8.5*  PROT 7.9 7.3  BILITOT 0.4 0.5  ALKPHOS 347* 254*  ALT 79* 54  AST 46* 32  GLUCOSE 96 92   Cortisol 1.6  Imaging/Diagnostic Tests: Ct Abdomen Pelvis W Contrast  07/30/2014   CLINICAL DATA:  Abdominal pain near the umbilicus. Concern for small bowel obstruction or ileus. History of laryngeal cancer.  EXAM: CT ABDOMEN AND PELVIS WITH CONTRAST  TECHNIQUE: Multidetector CT imaging of the abdomen and pelvis was  performed using the standard protocol following bolus administration of intravenous contrast.  CONTRAST:  72m OMNIPAQUE IOHEXOL 300 MG/ML  SOLN  COMPARISON:  Abdominal radiographs 07/28/2014 and abdominal CT 04/09/2014  FINDINGS: Stable calcified nodule in the left lower lobe on sequence 3, image 3. Mild dependent atelectasis at the lung bases without pleural effusions. Again noted are small subtle nodular densities at the lung bases which are unchanged.  No evidence for free intraperitoneal air. Patient now has a balloon retention gastrostomy tube. Normal appearance of the liver, gallbladder and portal venous system. Chronic calcifications of the pancreatic head compatible with old pancreatitis. Stable dilatation of the main pancreatic duct measuring up to 3 mm. Normal appearance of the spleen.  Normal appearance of the adrenal glands. Again noted is a 7 mm stone in the right kidney lower pole and there is moderate right hydroureteronephrosis. There has been chronic dilatation of the right renal pelvis but the caliceal dilatation has increased from the prior examination. Mild dilatation of the right ureter without a stone. There is concern for a small dense lesion or area of thickening at the left ureterovesical junction, best seen on sequence 2, image 72. There is chronic dilatation of the left renal pelvis probably  representing an extrarenal pelvis. No significant dilatation of the left ureter. Chronic punctate stone along the left kidney lower pole. There is mild to moderate distention of the urinary bladder and there are subtle areas of asymmetric wall thickening in the urinary bladder along the trigone and right side of the bladder.  Atherosclerotic calcifications in the abdominal aorta without aneurysm. No significant free fluid or lymphadenopathy.  No acute abnormality in the large or small bowel. There is no significant bowel dilatation to suggest obstruction or ileus. Again noted is sclerosis along the  superior femoral heads suggesting areas of AVN without collapse.  IMPRESSION: There is no evidence to suggest a bowel obstruction or ileus.  There is dilatation of the renal collecting systems, right side greater the left. Review of prior examinations suggests that this has been a chronic finding but there appears to be increased distension of the right renal collecting system and right ureter of uncertain etiology. There is no clear evidence for an obstructing stone. However, there is mild asymmetric wall thickening in the urinary bladder along the right side, trigone region and possibly near the left ureterovesical junction. Consider a urology consultation if the patient has not been evaluated in the past.  Interval placement of gastrostomy tube without complicating features.  Chronic calcifications at the pancreatic head suggesting old inflammation.  Nonobstructive bilateral renal calculi.   Electronically Signed   By: Markus Daft M.D.   On: 07/30/2014 08:25   Janora Norlander, DO 07/30/2014, 8:12 AM PGY-1, [place] Intern pager: [phone], text pages welcome

## 2014-07-30 NOTE — Evaluation (Signed)
Occupational Therapy Evaluation Patient Details Name: [name] MRN: [id number] DOB: 1951/01/11 Today's Date: 07/30/2014    History of Present Illness [name] is a 64 y.o. male presenting with abdominal pain, distention, and PEG tube drainage for 1.5 weeks. PMH is significant for laryngeal carcinoma, SCC of mouth, PEG tube, HFpEF, hypothyroidism, chronic pancreatitis, ?cirrhosis, h/o alcohol abuse, HTN, and HLD.   Clinical Impression   Pt was independent prior to admission and is performing at his baseline.  No further OT needs.   Follow Up Recommendations  No OT follow up    Equipment Recommendations  None recommended by OT    Recommendations for Other Services       Precautions / Restrictions Precautions Precautions: Fall Precaution Comments: pt reports h/o multiple falls at home, these tend to happen when he's sick      Mobility Bed Mobility Overal bed mobility: Independent                Transfers Overall transfer level: Independent                    Balance Overall balance assessment: Independent                                          ADL Overall ADL's : Independent                                             Vision     Perception     Praxis      Pertinent Vitals/Pain Pain Assessment: 0-10 Pain Score: 5  Pain Location: abdomen Pain Descriptors / Indicators: Sore Pain Intervention(s): Monitored during session     Hand Dominance Right   Extremity/Trunk Assessment Upper Extremity Assessment Upper Extremity Assessment: Overall WFL for tasks assessed   Lower Extremity Assessment Lower Extremity Assessment: Defer to PT evaluation   Cervical / Trunk Assessment Cervical / Trunk Assessment: Normal   Communication Communication Communication:  (h/o laryngectomy)   Cognition Arousal/Alertness: Awake/alert Behavior During Therapy: WFL for tasks assessed/performed Overall  Cognitive Status: Within Functional Limits for tasks assessed                     General Comments       Exercises       Shoulder Instructions      Home Living Family/patient expects to be discharged to:: Private residence Living Arrangements: Children Available Help at Discharge: Family;Available PRN/intermittently Type of Home: House Home Access: Stairs to enter [employer] of Steps: 3 Entrance Stairs-Rails: Right Home Layout: One level     Bathroom Shower/Tub: Teacher, early years/pre: Standard     Home Equipment: None          Prior Functioning/Environment Level of Independence: Independent        Comments: hx of falls    OT Diagnosis:     OT Problem List:     OT Treatment/Interventions:      OT Goals(Current goals can be found in the care plan section) Acute Rehab OT Goals Patient Stated Goal: to go fishing  OT Frequency:     Barriers to D/C:            Co-evaluation  End of Session    Activity Tolerance: Patient tolerated treatment well (HR 111 at rest-132 after activity) Patient left: in chair;with call bell/phone within reach   Time: 0926-0947 OT Time Calculation (min): 21 min Charges:  OT General Charges $OT Visit: 1 Procedure OT Evaluation $Initial OT Evaluation Tier I: 1 Procedure G-Codes: OT G-codes **NOT FOR INPATIENT CLASS** Functional Assessment Tool Used: clinical judgement Functional Limitation: Self care Self Care Current Status (U0370): 0 percent impaired, limited or restricted Self Care Goal Status (D6438): 0 percent impaired, limited or restricted Self Care Discharge Status (V8184): 0 percent impaired, limited or restricted  Malka So 07/30/2014, 10:10 AM  [phone]

## 2014-07-30 NOTE — Telephone Encounter (Addendum)
Return patient's daughter call regarding medications that was ordered by in-patient doctor.  Pt's blood pressure medication was restarted and pt's thyroid medication was decreased to 100 mcg.  Pt seen on 07/28/14 Thyroid medication was told to her it was 200 mcg but 175 mcg was sent in to pharmacy.  Precept with Dr. Gwendlyn Deutscher; she should have staff on the floor to call on-call resident for them to explain to her what is going on with her father's medication.  Medication may change at discharge.  Ms. [name] stated understanding.  Derl Barrow, RN

## 2014-07-30 NOTE — Progress Notes (Addendum)
Initial Nutrition Assessment  DOCUMENTATION CODES:  Severe malnutrition in context of chronic illness, Underweight  INTERVENTION: Initiate Jevity 1.2 via PEG at 25 ml/hr and increase by 10 ml every 4 hours to goal rate of 70 ml/hr to provide 2016 kcal (100% of needs), 93 grams of protein, and 1361 ml of free water.   Provide free water flushes of 175 ml QID per tube.   RD to continue to monitor.   NUTRITION DIAGNOSIS:  Malnutrition related to chronic illness as evidenced by severe depletion of body fat, severe depletion of muscle mass.  GOAL:  Patient will meet greater than or equal to 90% of their needs  MONITOR:  TF tolerance, Weight trends, Labs, I & O's  REASON FOR ASSESSMENT:  Consult Assessment of nutrition requirement/status  ASSESSMENT: Pt with abdominal pain, distention, and PEG tube drainage for 1.5 weeks. PMH is significant for laryngeal carcinoma, SCC of mouth, PEG tube, HFpEF, hypothyroidism, chronic pancreatitis, ?cirrhosis, h/o alcohol abuse, HTN, and HLD.  Pt reports moderate abdominal pain during time of visit. He reports he has been receiving tube feeds via PEG with Jevity 1.2 at 70 ml/hr continuously with free water flushes (175 ml free water QID per MD note). Pt reports he does consume a little water/juices as well as food/snacks by mouth, however sole nutrition is through the tube feedings. Pt reports usual body weight has been 114 lbs.  RD contacted via phone from MD to start tube feedings. RD to order.   Nutrition-Focused physical exam completed. Findings are severe fat depletion, severe muscle depletion.   Labs: Low sodium, CO2, calcium. High alkaline phosphatase.  Height:  Ht Readings from Last 1 Encounters:  06/15/14 5\' 11"  (1.803 m)    Weight:  Wt Readings from Last 1 Encounters:  07/28/14 114 lb 6 oz (51.88 kg)    Ideal Body Weight:  78 kg  Wt Readings from Last 10 Encounters:  07/28/14 114 lb 6 oz (51.88 kg)  06/15/14 107 lb 12.8  oz (48.898 kg)  06/05/14 110 lb (49.896 kg)  05/27/14 109 lb 2 oz (49.5 kg)  05/26/14 112 lb 14.4 oz (51.211 kg)  05/10/14 106 lb (48.081 kg)  05/07/14 106 lb 14.8 oz (48.5 kg)  04/13/14 109 lb 14.4 oz (49.85 kg)  07/10/13 127 lb (57.607 kg)  06/10/13 121 lb (54.885 kg)    BMI:  Body Mass Index: 16.01 kg/(m^2) Underweight  Estimated Nutritional Needs:  Kcal:  1800-2000  Protein:  80-95 grams  Fluid:  1.8 - 2 L/day  Skin:  Reviewed, no issues  Diet Order:     EDUCATION NEEDS:  No education needs identified at this time   Intake/Output Summary (Last 24 hours) at 07/30/14 1338 Last data filed at 07/30/14 0857  Gross per 24 hour  Intake 521.25 ml  Output    600 ml  Net -78.75 ml    Last BM:  5/25  Kallie Locks, MS, RD, LDN Pager # [phone] After hours/ weekend pager # [phone]

## 2014-07-30 NOTE — Progress Notes (Signed)
Pt urinated 100 cc's urine in urinal, attempted in and out cath post void residual.  Unable to cath pt ran into resistance x 2 nurses. [place] in room. V/O bladder scan.

## 2014-07-30 NOTE — Telephone Encounter (Signed)
Will forward to MD who saw patient for this hosptialization. [name],CMA

## 2014-07-30 NOTE — Progress Notes (Signed)
Pt post void residual by bladder scan 134 cc's

## 2014-07-30 NOTE — Telephone Encounter (Signed)
Spoke with patient's daughter [name]; she stated that they use the [facility] on [address]. The pharmacy listed is the correct one.  Derl Barrow, RN

## 2014-07-30 NOTE — Consult Note (Signed)
WOC wound consult note Reason for Consult: Consult requested for G-tube site.  Pt reports it frequently has leakage, according to progress notes.  Topical treatment will NOT resolve this problem; goal will be directed towards protecting skin surrounding tube. Wound type: .2cm erosion surrounding tube insertion site, beefy red. Drainage (amount, consistency, odor) Small amt yellow drainage, no odor Periwound: No open wound or maceration; no need for topical woundcare at this time, other than standard cleansing with NS and split thickness gauze to absorb drainage.  Discussed plan of care with patient and he denies further questions. Please re-consult if further assistance is needed.  Thank-you,  Julien Girt MSN, [place], [place], [place], [place]

## 2014-07-30 NOTE — Consult Note (Signed)
Urology Consult  Referring physician: Adam Phenix DO  Reason for referral: Questionable bilateral hydronephrosis  History of Present Illness: 64 year old male with a very complex past medical history. He is currently under the treatment of physicians at [facility] for laryngeal carcinoma. He was admitted recently with abdominal pain and distention. There was some concern over drainage around a PEG tube in the initial diagnosis was questionable bowel obstruction. As part of his assessment he underwent CT imaging. There was no radiographic evidence of a small bowel obstruction but there was bilateral dilation of the renal collecting system more pronounced on the right side. There was noted to be some slight asymmetric bladder wall thickening and at least a modest postvoid residual. The patient has had a multitude of imaging studies dating back over several years. He has had some chronic bilateral renal collecting system dilation on a chronic basis. He appears to have at the very least some significant extrarenal pelvis by and there has been some chronic dilation of the right proximal ureter. He has a history of nephrolithiasis and is known to have some nonobstructing renal calculi. There is been no recent imaging studies suggesting any ureteral calculi. His creatinine is been well within normal limits and has not changed recently. Most recent creatinine was 0.66. History by conversation is difficult due to his prior history of laryngeal carcinoma with surgery and treatment. The ultimate prognosis of his laryngeal carcinoma is unclear to me. He has had consultation with palliative medicine. In addition to the above problems the patient has a prior history of chronic pancreatitis with a history of alcohol abuse and hypertension. Patient reports minimal pain at this time. When asked where he has discomfort he circles an area around his PEG tube.  Past Medical History  Diagnosis Date  . Hypertension    . Cirrhosis of liver   . Hypothyroidism   . Alcohol abuse   . CHF (congestive heart failure)     EF 45-50% 05/2012  . Hyperlipidemia   . Arthritis   . Laryngeal cancer 2011    Laryngectomy, T3N0M0  . Seizures    Past Surgical History  Procedure Laterality Date  . Tracheal surgery    . Tracheostomy    . Left and right heart catheterization with coronary angiogram N/A 12/28/2011    Procedure: LEFT AND RIGHT HEART CATHETERIZATION WITH CORONARY ANGIOGRAM;  Surgeon: Jolaine Artist, MD;  Location: [facility] CATH LAB;  Service: Cardiovascular;  Laterality: N/A;  . Esophagogastroduodenoscopy N/A 04/10/2014    Procedure: ESOPHAGOGASTRODUODENOSCOPY (EGD);  Surgeon: Missy Sabins, MD;  Location: [facility] ENDOSCOPY;  Service: Endoscopy;  Laterality: N/A;    Medications:  Scheduled: . bisoprolol  5 mg Oral Daily  . [START ON 08/01/2014] fentaNYL  37.5 mcg Transdermal Q72H  . free water  175 mL Per Tube QID  . heparin  5,000 Units Subcutaneous 3 times per day  . [START ON 07/31/2014] levothyroxine  175 mcg Oral QAC breakfast  . polyethylene glycol  17 g Oral Daily  . sennosides  5 mL Oral QHS  . tamsulosin  0.4 mg Oral QPC breakfast  . thiamine  100 mg Oral Daily   Or  . thiamine  100 mg Intravenous Daily    Allergies:  Allergies  Allergen Reactions  . Vicodin [Hydrocodone-Acetaminophen] Rash  . Other     Unknown anesthesia medicine caused cardiac arrest.  . Tylenol [Acetaminophen]     HBP -Liver Cirrhosis    Family History  Problem Relation Age of Onset  .  Hyperlipidemia Mother   . Hypertension Mother   . Hypertension Father   . Hyperlipidemia Father     Social History:  reports that he has quit smoking. His smoking use included Cigarettes. He has a 10 pack-year smoking history. He has never used smokeless tobacco. He reports that he drinks alcohol. He reports that he does not use illicit drugs.  ROS  Physical Exam:  Vital signs in last 24 hours: Temp:  [98.4 F (36.9 C)-99.6 F  (37.6 C)] 99.6 F (37.6 C) (05/26 1723) Pulse Rate:  [98-132] 109 (05/26 1723) Resp:  [16-18] 17 (05/26 1723) BP: (111-117)/(75-81) 117/81 mmHg (05/26 1723) SpO2:  [97 %-100 %] 100 % (05/26 1723)  Constitutional: Vital signs reviewed. WD WN in NAD Head: Normocephalic and atraumatic   Eyes: PERRL, No scleral icterus.  Neck: Supple No  Gross JVD Cardiovascular: RRR Pulmonary/Chest: Normal effort Abdominal: Soft. Non-tender positive for mild to moderate distention. Abdomen otherwise nicely soft and nontender.  Genitourinary: Normal external genitalia Extremities: No cyanosis or edema  Neurological: Grossly non-focal.  Skin: Warm,very dry and intact. No rash, cyanosis   Laboratory Data:  Results for orders placed or performed during the hospital encounter of 07/29/14 (from the past 72 hour(s))  CBC     Status: Abnormal   Collection Time: 07/29/14  7:50 PM  Result Value Ref Range   WBC 7.4 4.0 - 10.5 K/uL   RBC 3.05 (L) 4.22 - 5.81 MIL/uL   Hemoglobin 8.4 (L) 13.0 - 17.0 g/dL   HCT 26.0 (L) 39.0 - 52.0 %   MCV 85.2 78.0 - 100.0 fL   MCH 27.5 26.0 - 34.0 pg   MCHC 32.3 30.0 - 36.0 g/dL   RDW 17.3 (H) 11.5 - 15.5 %   Platelets 392 150 - 400 K/uL  Gamma GT     Status: Abnormal   Collection Time: 07/29/14  7:50 PM  Result Value Ref Range   GGT 180 (H) 7 - 50 U/L  Hepatitis panel, acute     Status: None   Collection Time: 07/29/14  7:50 PM  Result Value Ref Range   Hepatitis B Surface Ag NEGATIVE NEGATIVE   HCV Ab NEGATIVE NEGATIVE   Hep A IgM NON REACTIVE NON REACTIVE    Comment: (NOTE) Effective January 19, 2014, Hepatitis Acute Panel (test code [id number]) will be revised to automatically reflex to the Hepatitis C Viral RNA, Quantitative, Real-Time PCR assay if the Hepatitis C antibody screening result is Reactive. This action is being taken to ensure that the CDC/USPSTF recommended HCV diagnostic algorithm with the appropriate test reflex needed for accurate interpretation  is followed.    Hep B C IgM NON REACTIVE NON REACTIVE    Comment: (NOTE) High levels of Hepatitis B Core IgM antibody are detectable during the acute stage of Hepatitis B. This antibody is used to differentiate current from past HBV infection. Performed at [facility]   Lactic acid, plasma     Status: None   Collection Time: 07/29/14 11:37 PM  Result Value Ref Range   Lactic Acid, Venous 1.0 0.5 - 2.0 mmol/L  Cortisol-am, blood     Status: Abnormal   Collection Time: 07/30/14  5:34 AM  Result Value Ref Range   Cortisol - AM 1.6 (L) 6.7 - 22.6 ug/dL  Comprehensive metabolic panel     Status: Abnormal   Collection Time: 07/30/14  5:34 AM  Result Value Ref Range   Sodium 133 (L) 135 - 145 mmol/L  Potassium 4.1 3.5 - 5.1 mmol/L   Chloride 102 101 - 111 mmol/L   CO2 21 (L) 22 - 32 mmol/L   Glucose, Bld 92 65 - 99 mg/dL   BUN 16 6 - 20 mg/dL   Creatinine, Ser 0.66 0.61 - 1.24 mg/dL   Calcium 8.5 (L) 8.9 - 10.3 mg/dL   Total Protein 7.3 6.5 - 8.1 g/dL   Albumin 2.2 (L) 3.5 - 5.0 g/dL   AST 32 15 - 41 U/L   ALT 54 17 - 63 U/L   Alkaline Phosphatase 254 (H) 38 - 126 U/L   Total Bilirubin 0.5 0.3 - 1.2 mg/dL   GFR calc non Af Amer >60 >60 mL/min   GFR calc Af Amer >60 >60 mL/min    Comment: (NOTE) The eGFR has been calculated using the CKD EPI equation. This calculation has not been validated in all clinical situations. eGFR's persistently <60 mL/min signify possible Chronic Kidney Disease.    Anion gap 10 5 - 15  CBC     Status: Abnormal   Collection Time: 07/30/14  5:34 AM  Result Value Ref Range   WBC 6.5 4.0 - 10.5 K/uL   RBC 2.81 (L) 4.22 - 5.81 MIL/uL   Hemoglobin 7.7 (L) 13.0 - 17.0 g/dL   HCT 23.7 (L) 39.0 - 52.0 %   MCV 84.3 78.0 - 100.0 fL   MCH 27.4 26.0 - 34.0 pg   MCHC 32.5 30.0 - 36.0 g/dL   RDW 17.2 (H) 11.5 - 15.5 %   Platelets 341 150 - 400 K/uL  Urinalysis, Routine w reflex microscopic (not at [facility])     Status: Abnormal   Collection Time:  07/30/14  3:02 PM  Result Value Ref Range   Color, Urine YELLOW YELLOW   APPearance CLEAR CLEAR   Specific Gravity, Urine 1.014 1.005 - 1.030   pH 8.0 5.0 - 8.0   Glucose, UA NEGATIVE NEGATIVE mg/dL   Hgb urine dipstick MODERATE (A) NEGATIVE   Bilirubin Urine NEGATIVE NEGATIVE   Ketones, ur NEGATIVE NEGATIVE mg/dL   Protein, ur NEGATIVE NEGATIVE mg/dL   Urobilinogen, UA 0.2 0.0 - 1.0 mg/dL   Nitrite NEGATIVE NEGATIVE   Leukocytes, UA NEGATIVE NEGATIVE  Urine microscopic-add on     Status: Abnormal   Collection Time: 07/30/14  3:02 PM  Result Value Ref Range   Squamous Epithelial / LPF RARE RARE   WBC, UA 0-2 <3 WBC/hpf   RBC / HPF TOO NUMEROUS TO COUNT <3 RBC/hpf   Bacteria, UA FEW (A) RARE   No results found for this or any previous visit (from the past 240 hour(s)). Creatinine:  Recent Labs  07/28/14 1144 07/30/14 0534  CREATININE 0.65 0.66   Baseline Creatinine:   Impression/Assessment:  Mr. Pehl has had long-standing radiographic evidence of bilateral collecting system dilation. Most recent imaging do show increased distention primarily of the right renal pelvis and proximal ureter. An etiology for the dilation has never been established. It is unlikely to be clinically significant given the long-standing nature of the dilation along with preserved normal-appearing renal parenchyma and normal systemic renal function. Generally chronic dilation/hydronephrosis is asymptomatic. Abdominal pain is typically associated with acute ureteral obstruction but not chronic. His current area of discomfort is in the midline of his abdomen around his PEG tube. This is not where I would expect the patient experienced pain secondary to renal obstruction. I suspect he may have a mild benign stricture of his ureter secondary to prior episodes  of nephrolithiasis. In my opinion it is very unlikely that this collecting system dilation is contributing to his current clinical complex. This would be  unlikely as a source of his abdominal pain and distention. The mild bladder asymmetry is extremely subtle and also very unlikely to be clinically significant. Further assessment of these issues would be tempered by his prognosis with regard to his laryngeal carcinoma. If his prognosis is reasonable and certainly we could consider additional assessment down the road as an outpatient. This could include cystoscopy with bilateral retrograde pyelography cystogram etc. If the patient is headed more towards a palliative approach for ongoing management then certainly this would be nothing that I would pursue.  Plan:  No indication for any acute urologic intervention. We'll consider outpatient assessment pending further assessment of this patient's prognosis and management plan. Jamise Pentland S 07/30/2014, 7:28 PM

## 2014-07-30 NOTE — Evaluation (Addendum)
Physical Therapy Evaluation Patient Details Name: [name] MRN: [id number] DOB: May 28, 1950 Today's Date: 07/30/2014   History of Present Illness  [name] is a 64 y.o. male presenting with abdominal pain, distention, and PEG tube drainage for 1.5 weeks. PMH is significant for laryngeal carcinoma, SCC of mouth, PEG tube, HFpEF, hypothyroidism, chronic pancreatitis, ?cirrhosis, h/o alcohol abuse, HTN, and HLD.  Clinical Impression  Pt is independent with mobility. He walked 300' without an assistive device, with no loss of balance. HR 111 at rest, 132 with walking, SaO2 100% on RA walking, 0/4 dyspnea. Encouraged pt to walk in halls ad lib to prevent deconditioning while in hospital. No further acute PT needs, PT signing off.     Follow Up Recommendations No PT follow up    Equipment Recommendations  None recommended by PT    Recommendations for Other Services       Precautions / Restrictions Precautions Precautions: Fall Precaution Comments: pt reports h/o multiple falls at home, these tend to happen when he's sick      Mobility  Bed Mobility Overal bed mobility: Independent                Transfers Overall transfer level: Independent                  Ambulation/Gait Ambulation/Gait assistance: Independent Distance: 300 feet   Assistive device: None   Gait velocity: WFL   General Gait Details: steady, no LOB; HR 132 walking, 111 at rest, no dyspnea with mobility, SaO2 100% on RA walking  Stairs            Wheelchair Mobility    Modified Rankin (Stroke Patients Only)       Balance Overall balance assessment: Independent                                           Pertinent Vitals/Pain Pain Assessment: 0-10 Pain Score: 5  Pain Location: abdomen, at rest and with activity Pain Descriptors / Indicators: Sore Pain Intervention(s): Monitored during session    Home Living Family/patient expects to be discharged  to:: Private residence Living Arrangements: Children Available Help at Discharge: Family;Available PRN/intermittently Type of Home: House Home Access: Stairs to enter Entrance Stairs-Rails: Right Entrance Stairs-Number of Steps: 3 Home Layout: One level Home Equipment: None      Prior Function Level of Independence: Independent         Comments: hx of falls     Hand Dominance   Dominant Hand: Right    Extremity/Trunk Assessment   Upper Extremity Assessment: Overall WFL for tasks assessed           Lower Extremity Assessment: Overall WFL for tasks assessed      Cervical / Trunk Assessment: Normal  Communication   Communication:  (h/o laryngectomy, pt mouths words and uses hand gestures)  Cognition Arousal/Alertness: Awake/alert Behavior During Therapy: WFL for tasks assessed/performed Overall Cognitive Status: Within Functional Limits for tasks assessed                      General Comments      Exercises        Assessment/Plan    PT Assessment Patent does not need any further PT services  PT Diagnosis     PT Problem List    PT Treatment Interventions  PT Goals (Current goals can be found in the Care Plan section) Acute Rehab PT Goals Patient Stated Goal: to go fishing PT Goal Formulation: All assessment and education complete, DC therapy    Frequency     Barriers to discharge        Co-evaluation               End of Session Equipment Utilized During Treatment: Gait belt Activity Tolerance: Patient tolerated treatment well;No increased pain Patient left: in chair;with call bell/phone within reach Nurse Communication: Mobility status (HR 132 walking)         Time: 4536-4680 PT Time Calculation (min) (ACUTE ONLY): 20 min   Charges:   PT Evaluation $Initial PT Evaluation Tier I: 1 Procedure     PT G Codes:     PT G-Codes **NOT FOR INPATIENT CLASS** Functional Assessment Tool Used clinical judgement clinical  judgement at 0953 on 07/30/14 by Lucile Crater, PT Functional Limitation Mobility: Walking and moving around Mobility: Walking and moving around at 0953 on 07/30/14 by Lucile Crater, PT Mobility: Walking and Moving Around Current Status [phone]) 0 percent impaired, limited or restricted [facility] at 0953 on 07/30/14 by Lucile Crater, PT Mobility: Walking and Moving Around Goal Status [phone]) 0 percent impaired, limited or restricted [facility] at 0953 on 07/30/14 by Lucile Crater, PT Mobility: Walking and Moving Around Discharge Status [phone]) 0 percent impaired, limited or restricted     Philomena Doheny 07/30/2014, 9:54 AM [phone]

## 2014-07-30 NOTE — Consult Note (Signed)
Consultation Note Date: 07/30/2014   Patient Name: [name]  DOB: February 22, 1951  MRN: [id number]  Age / Sex: 64 y.o., male   PCP: Ma Hillock, DO Referring Physician: Alveda Reasons, MD  Reason for Consultation: Establishing goals of care  Palliative Care Assessment and Plan Summary of Established Goals of Care and Medical Treatment Preferences    Palliative Care Discussion Held Today Contacts/Participants in Discussion: Daughter Ociel, [place] Primary Decision Maker: Patient  Remi is hard to understand given his surgeries related to laryngeal CA.  I was able to get some information from him, but mostly relied on his daughter [name]. Recently completed course of chemo/XRT which finished few weeks ago.  [name] states that the information from [facility] is that treatment went really well and that particularly radiation oncology was surprised by how quickly the saw "tumor shrink" with course of radiation. They would typically see this 4-6 weeks after completion.  He was supposed to follow-up with Dr Melven Sartorius today but obviously admitted to hospital. All indications they hear is that things have gone well with cancer treatment and we do not have any imaging evidence to suggest that malignancy has progressed/worsened.  The one issue is the fatigue and weakness he has had over past few weeks.  With low AM cortisol, abd pain, mild hypionatremia, have to wonder if this is potentially more related to adrenal insufficiency rather than malignancy or associated treatments.  I will meet with [name] and Lawana Chambers tomorrow and we will see how he is doing then.  I think long term goals of care will be dependent on Oncology input at [facility] and further imaging/tests may be needed to determine that. I have encouraged them to follow-up with [place] outpatient clinic in the past, but this does not appear to have happened.     Code Status/Advance Care Planning:  Full, not  discussed today  Symptom Management:   Abdominal Pain- Unclear etiology. Daughter states that it started with bolus tube feeds at [facility] which he has not tolerated well in past.  PEG site appears unremarkable. Have to wonder if adrenal insufficiency also playing a role here.  Could have some motility issues as well.  Will see how he does over next 24hrs. Can continue his current pain regimen.   Cancer Related Pain- Mainly in throat historically and this is fairly well controlled currently. H follows with a Dr Wynetta Emery at [facility] pain clinic. His fentanyl patch dosing is actually now 37.45mcg and I will make that change. While NPO, it is fine to use 2mg  IV morphine PRN which is a pretty equivalent dose to his home oxycodone 5mg  PRN  Palliative Prophylaxis: Agree with Miralax, but he also may need to have stimulant laxative such as senna in addition given his likely opioid influenced constipation.    Psycho-social/Spiritual:   Support System: 4 children and currently lives at home with his daughter [name] in [place]. Originally from [place]. [name] seems to be his main support system and has been attending appointments for his care.    Prognosis: Unable to determine  Discharge Planning:  Home when medically stable       Chief Complaint: Abdominal Paion  History of Present Illness:  64 yo male well known to me from prior hospitalizations with PMHx of laryngeal CA s/p laryngectomy, CHF, etoh abuse. He was initially diagnosed with laryngeal cancer in 2011 and just recently found to have recurrent disease a few months ago.  He has been  under the care of Dr Melven Sartorius (medical oncology) at [facility] and recently underwent treatment with XRT and chemo (carbo/taxol).  He did have admission to [facility] a few weeks ago with neutropenic fevers and discharged home. Per his daughter, he received bolus tube feeds as inpatient, and this is about the time his abdominal pain (periumbilical, epigastric)  began.  He was switched to continuous tube feeds again at discharge, but the pain has continued to worsen.  While he is ambulatory, he has felt very weak and fatigued and not getting out of the house much since being back home.   He was admitted yesterday with 8/91 periumbilical abdominal pain and ? Of purulent drainage from around PEG.  He has been moving bowels but some hard stools noted recently.    Primary Diagnoses  Present on Admission:  . Malignant neoplasm of larynx . Essential hypertension, benign . Hypothyroidism . Chronic systolic heart failure . Alcohol dependence . Decreased appetite . Hypotension . Adrenal insufficiency, primary, iatrogenic . Hyperlipidemia . 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 Protein-calorie malnutrition, severe . Esophageal mass . Abdominal pain, epigastric  I have reviewed the medical record, interviewed the patient and family, and examined the patient. The following aspects are pertinent.  Past Medical History  Diagnosis Date  . Hypertension   . Cirrhosis of liver   . Hypothyroidism   . Alcohol abuse   . CHF (congestive heart failure)     EF 45-50% 05/2012  . Hyperlipidemia   . Arthritis   . Laryngeal cancer 2011    Laryngectomy, [id number]  . Seizures    History   Social History  . Marital Status: Widowed    Spouse Name: N/A  . Number of Children: N/A  . Years of Education: N/A   Social History Main Topics  . Smoking status: Former Smoker -- 0.20 packs/day for 50 years    Types: Cigarettes  . Smokeless tobacco: Never Used  . Alcohol Use: Yes     Comment: "rarely"  . Drug Use: No  . Sexual Activity: Not on file   Other Topics Concern  . None   Social History Narrative   Family History  Problem Relation Age of Onset  . Hyperlipidemia Mother   . Hypertension Mother   . Hypertension Father   . Hyperlipidemia Father    Scheduled Meds: . [START ON 08/01/2014] fentaNYL  37.5 mcg Transdermal  Q72H  . heparin  5,000 Units Subcutaneous 3 times per day  . levothyroxine  100 mcg Intravenous Daily  . tamsulosin  0.4 mg Oral QPC breakfast  . thiamine  100 mg Oral Daily   Or  . thiamine  100 mg Intravenous Daily   Continuous Infusions:  PRN Meds:.LORazepam **OR** LORazepam, morphine injection, ondansetron (ZOFRAN) IV Medications Prior to Admission:  Prior to Admission medications   Medication Sig Start Date End Date Taking? Authorizing Provider  aspirin EC 81 MG tablet Take 81 mg by mouth daily. 03/12/14  Yes Historical Provider, MD  fentaNYL (DURAGESIC - DOSED MCG/HR) 12 MCG/HR Place 12.5 mcg onto the skin every other day.  07/21/14 08/20/14 Yes Historical Provider, MD  fentaNYL (DURAGESIC - DOSED MCG/HR) 25 MCG/HR patch Place 1 patch (25 mcg total) onto the skin every 3 (three) days. Patient taking differently: Place 25 mcg onto the skin every other day.  05/07/14  Yes Frazier Richards, MD  fluconazole (DIFLUCAN) 200 MG tablet Take 200 mg by mouth daily. 07/18/14  Yes Historical Provider, MD  guaiFENesin (MUCINEX) 600 MG 12 hr tablet Take 1 tablet (600 mg total) by mouth 2 (two) times daily. 05/26/14  Yes Renee A Kuneff, DO  levothyroxine (SYNTHROID, LEVOTHROID) 175 MCG tablet Take 1 tablet (175 mcg total) by mouth daily before breakfast. 07/28/14  Yes Hilton Sinclair, MD  lidocaine (XYLOCAINE) 2 % solution Use as directed 10 mLs in the mouth or throat 4 (four) times daily as needed for mouth pain.  07/09/14  Yes Historical Provider, MD  Nutritional Supplements (FEEDING SUPPLEMENT, JEVITY 1.5 CAL,) LIQD Place 70 mL/hr into feeding tube continuous.   Yes Historical Provider, MD  ondansetron (ZOFRAN) 4 MG tablet Take 1 tablet (4 mg total) by mouth every 8 (eight) hours as needed for nausea or vomiting. 05/14/13  Yes Leone Brand, MD  oxyCODONE (ROXICODONE) 5 MG/5ML solution Take 5 mLs (5 mg total) by mouth every 4 (four) hours as needed for severe pain. 05/07/14  Yes Frazier Richards, MD  Water For  Irrigation, Sterile (FREE WATER) SOLN Place 175 mLs into feeding tube 4 (four) times daily. 05/07/14  Yes Frazier Richards, MD  Alum & Mag Hydroxide-Simeth (MAGIC MOUTHWASH W/LIDOCAINE) SOLN Take 5 mLs by mouth 4 (four) times daily. 05/07/14   Frazier Richards, MD  megestrol (MEGACE) 40 MG/ML suspension Take 10 mLs (400 mg total) by mouth 2 (two) times daily. Patient taking differently: Take 400 mg by mouth daily.  05/26/14   Renee A Kuneff, DO   Allergies  Allergen Reactions  . Vicodin [Hydrocodone-Acetaminophen] Rash  . Other     Unknown anesthesia medicine caused cardiac arrest.  . Tylenol [Acetaminophen]     HBP -Liver Cirrhosis   CBC:    Component Value Date/Time   WBC 6.5 07/30/2014 0534   WBC 4.3 11/04/2009 1559   HGB 7.7* 07/30/2014 0534   HGB 12.4* 11/04/2009 1559   HCT 23.7* 07/30/2014 0534   HCT 38.0* 11/04/2009 1559   PLT 341 07/30/2014 0534   PLT 169 11/04/2009 1559   MCV 84.3 07/30/2014 0534   MCV 91.2 11/04/2009 1559   NEUTROABS 4.0 05/27/2014 1646   NEUTROABS 1.6 11/04/2009 1559   LYMPHSABS 1.8 05/27/2014 1646   LYMPHSABS 2.1 11/04/2009 1559   MONOABS 1.1* 05/27/2014 1646   MONOABS 0.4 11/04/2009 1559   EOSABS 0.1 05/27/2014 1646   EOSABS 0.1 11/04/2009 1559   BASOSABS 0.0 05/27/2014 1646   BASOSABS 0.0 11/04/2009 1559   Comprehensive Metabolic Panel:    Component Value Date/Time   NA 133* 07/30/2014 0534   K 4.1 07/30/2014 0534   CL 102 07/30/2014 0534   CO2 21* 07/30/2014 0534   BUN 16 07/30/2014 0534   CREATININE 0.66 07/30/2014 0534   CREATININE 0.65 07/28/2014 1144   GLUCOSE 92 07/30/2014 0534   CALCIUM 8.5* 07/30/2014 0534   AST 32 07/30/2014 0534   ALT 54 07/30/2014 0534   ALKPHOS 254* 07/30/2014 0534   BILITOT 0.5 07/30/2014 0534   PROT 7.3 07/30/2014 0534   ALBUMIN 2.2* 07/30/2014 0534    Physical Exam: Vital Signs: BP 113/76 mmHg  Pulse 102  Temp(Src) 99.3 F (37.4 C) (Oral)  Resp 17  SpO2 100% SpO2: SpO2: 100 % O2 Device: O2 Device: Not  Delivered O2 Flow Rate:   Intake/output summary:  Intake/Output Summary (Last 24 hours) at 07/30/14 0928 Last data filed at 07/30/14 0857  Gross per 24 hour  Intake 521.25 ml  Output    600 ml  Net -78.75 ml   LBM:  Baseline Weight:   Most recent weight:    Exam Findings:  GEN: alert, NAD CV: RRR ABD: soft, ND, mild tenderness around lower portion og PEG         Palliative Performance Scale: 40              Additional Data Reviewed: Recent Labs     07/28/14  1144  07/29/14  1950  07/30/14  0534  WBC   --   7.4  6.5  HGB   --   8.4*  7.7*  PLT   --   392  341  NA  133*   --   133*  BUN  22   --   16  CREATININE  0.65   --   0.66   5/26 CT Abd/Pelvis IMPRESSION: There is no evidence to suggest a bowel obstruction or ileus.  There is dilatation of the renal collecting systems, right side greater the left. Review of prior examinations suggests that this has been a chronic finding but there appears to be increased distension of the right renal collecting system and right ureter of uncertain etiology. There is no clear evidence for an obstructing stone. However, there is mild asymmetric wall thickening in the urinary bladder along the right side, trigone region and possibly near the left ureterovesical junction. Consider a urology consultation if the patient has not been evaluated in the past.  Interval placement of gastrostomy tube without complicating features.  Chronic calcifications at the pancreatic head suggesting old inflammation.  Nonobstructive bilateral renal calculi.   Total Time: 60 minutes Greater than 50%  of this time was spent counseling and coordinating care related to the above assessment and plan.  Signed by: Isaac Laud, DO  Doran Clay, DO  07/30/2014, 9:28 AM  Please contact Palliative Medicine Team phone at [phone] for questions and concerns.

## 2014-07-30 NOTE — Progress Notes (Signed)
[facility]  Patient Status: Active (receiving services up to time of hospitalization)  [facility] is providing the following services: RN and ST  If patient discharges after hours, please call [phone].   [name] 07/30/2014, 3:59 PM

## 2014-07-31 DIAGNOSIS — R1013 Epigastric pain: Secondary | ICD-10-CM | POA: Diagnosis not present

## 2014-07-31 DIAGNOSIS — I10 Essential (primary) hypertension: Secondary | ICD-10-CM | POA: Diagnosis not present

## 2014-07-31 DIAGNOSIS — E2749 Other adrenocortical insufficiency: Secondary | ICD-10-CM | POA: Diagnosis not present

## 2014-07-31 DIAGNOSIS — E2740 Unspecified adrenocortical insufficiency: Secondary | ICD-10-CM

## 2014-07-31 DIAGNOSIS — G893 Neoplasm related pain (acute) (chronic): Secondary | ICD-10-CM | POA: Diagnosis not present

## 2014-07-31 DIAGNOSIS — R000 Tachycardia, unspecified: Secondary | ICD-10-CM | POA: Diagnosis not present

## 2014-07-31 DIAGNOSIS — R1033 Periumbilical pain: Secondary | ICD-10-CM | POA: Diagnosis not present

## 2014-07-31 DIAGNOSIS — I959 Hypotension, unspecified: Secondary | ICD-10-CM | POA: Diagnosis not present

## 2014-07-31 DIAGNOSIS — R630 Anorexia: Secondary | ICD-10-CM

## 2014-07-31 DIAGNOSIS — E039 Hypothyroidism, unspecified: Secondary | ICD-10-CM | POA: Diagnosis not present

## 2014-07-31 DIAGNOSIS — M1990 Unspecified osteoarthritis, unspecified site: Secondary | ICD-10-CM | POA: Diagnosis not present

## 2014-07-31 DIAGNOSIS — F1029 Alcohol dependence with unspecified alcohol-induced disorder: Secondary | ICD-10-CM

## 2014-07-31 DIAGNOSIS — Z515 Encounter for palliative care: Secondary | ICD-10-CM | POA: Diagnosis not present

## 2014-07-31 LAB — COMPREHENSIVE METABOLIC PANEL
ALBUMIN: 2.2 g/dL — AB (ref 3.5–5.0)
ALT: 46 U/L (ref 17–63)
AST: 29 U/L (ref 15–41)
Alkaline Phosphatase: 283 U/L — ABNORMAL HIGH (ref 38–126)
Anion gap: 9 (ref 5–15)
BILIRUBIN TOTAL: 0.4 mg/dL (ref 0.3–1.2)
BUN: 14 mg/dL (ref 6–20)
CO2: 24 mmol/L (ref 22–32)
CREATININE: 0.65 mg/dL (ref 0.61–1.24)
Calcium: 8.7 mg/dL — ABNORMAL LOW (ref 8.9–10.3)
Chloride: 101 mmol/L (ref 101–111)
GFR calc Af Amer: >60 mL/min (ref >60)
GFR calc non Af Amer: >60 mL/min (ref >60)
Glucose, Bld: 81 mg/dL (ref 65–99)
POTASSIUM: 3.6 mmol/L (ref 3.5–5.1)
SODIUM: 134 mmol/L — AB (ref 135–145)
TOTAL PROTEIN: 7.7 g/dL (ref 6.5–8.1)

## 2014-07-31 LAB — CBC
HEMATOCRIT: 25 % — AB (ref 39.0–52.0)
HEMOGLOBIN: 8 g/dL — AB (ref 13.0–17.0)
MCH: 27.1 pg (ref 26.0–34.0)
MCHC: 32 g/dL (ref 30.0–36.0)
MCV: 84.7 fL (ref 78.0–100.0)
PLATELETS: 303 10*3/uL (ref 150–400)
RBC: 2.95 MIL/uL — AB (ref 4.22–5.81)
RDW: 17.1 % — ABNORMAL HIGH (ref 11.5–15.5)
WBC: 4.7 10*3/uL (ref 4.0–10.5)

## 2014-07-31 MED ORDER — BISOPROLOL FUMARATE 5 MG PO TABS: 5.0000 mg | ORAL_TABLET | Freq: Every day | ORAL | Status: DC

## 2014-07-31 MED ORDER — HYDROCORTISONE 10 MG PO TABS
10.0000 mg | ORAL_TABLET | Freq: Every day | ORAL | Status: DC
  Administered 2014-08-01: 10 mg via ORAL
  Filled 2014-07-31 (×2): qty 1

## 2014-07-31 MED ORDER — TAMSULOSIN HCL 0.4 MG PO CAPS: 0.4000 mg | ORAL_CAPSULE | Freq: Every day | ORAL | Status: DC

## 2014-07-31 MED ORDER — HYDROCORTISONE 5 MG PO TABS
5.0000 mg | ORAL_TABLET | Freq: Every day | ORAL | Status: AC
Start: 2014-07-31 — End: 2014-07-31
  Administered 2014-07-31: 5 mg via ORAL
  Filled 2014-07-31: qty 1

## 2014-07-31 MED ORDER — HYDROCORTISONE 5 MG PO TABS: ORAL_TABLET | ORAL | Status: DC

## 2014-07-31 NOTE — Progress Notes (Signed)
Family Medicine Teaching Service Daily Progress Note Intern Pager: [phone]  Patient name: [name] Medical record number: [id number] Date of birth: 10-19-1950 Age: 64 y.o. Gender: male  Primary Care Provider: Howard Pouch, DO Consultants: urology, palliative Code Status: FULL   Pt Overview and Major Events to Date:  05/25: admitted  Assessment and Plan: [name] is a 64 y.o. male presenting with abdominal pain, distention, and PEG tube drainage for 1.5 weeks. PMH is significant for laryngeal carcinoma, SCC of mouth, PEG tube, HFpEF, hypothyroidism, chronic pancreatitis, ?cirrhosis, h/o alcohol abuse, HTN, and HLD.  Abdominal Pain / Distension. Abdomen with mild tenderness, but no peritoneal signs.  1/4 SIRS criteria on admission (though no WBC available). qSOFA 0. Also consider metastases. Drainage from PEG site does not appear infective. CT neg for SBO but does show dilatation of renal collecting systems R>L and asymmetric bladder wall thickening.  - telemetry - Peg feeds, SLIV - c/s urology. Outpatient assessment PRN  - flomax started - PEG site, WOC consulted.  Tachycardia / Hypotension. Noted to have persistent tachycardia for several months. Has been worked up for adrenal insufficiency at [facility] with low AM cortisol in 2015 - thought to be related to chronic megace use.  Tachycardia may also be related to recent discontinuation of bisoprolol due to persistent hypotension.  Cortisol 1.6. LA 1.0 - ? Adrenal insufficiency vs 2/2 Megace.   - Will continue to monitor BP/HR.   - Will allow cortisol to increase independently for now, as patient recently stopped Megace (last week) - Will Consider Cortef 28m TID if persistent tachycardia/hypotension not responsive to gentle hydration/BB. - BP stable. HR 106.  Bisoprolol at decreased dose of 572m as recently dc'd because of persistent hypotension (previously on 1019m- Blood cultures x 2; no growth to date - UA with  moderate hgb and few bacteria.  Suspect hemoglobin 2/2 traumatic cath.  Denies dysuria and no fevers.  Endorses frequency but this can be explained by enlarged prostate.  No other evidence of infection on UA.  Ucx pending.  Elevated Transaminases and alk phos. AST 46, ALT 79 on day prior to admission. Likely secondary to cirrhosis vs poor perfusion with low BP. ALK phos 347>254.  Elevated alk phos likely related to history of carcinoma and recent radiation and chemotherapy. Hepatitis panel negative.  GGT 180 - Daily CMP - Consider RUQ u/s  Laryngeal carcinoma / mouth SCC. [facility] 3 months [facility] due to difficulty feeding by mouth.  - Hold megace. Apparently, last taken last week. - tube feeds - Continue home fentanyl patch  - Resume home Oxy - c/s palliative care, to meet with daughter and patient again for further GOC discussion  -Miralax and Senna  Seizure-like activity. Patient worked up for this during admission 2 months ago. Thought to be likely due to hypotension. Work up at that time with MRI only significant for incidental sphenoid meningioma (which would not explain symptoms) and EEG without seizure activity.  - Continue to monitor - Holding home antihypertensives - PT/OT consulted. Walker ordered outpatient to decrease risk of falls.  Hypothyroidism. TSH 0.078 on 3/25. Home synthroid dose 200m16maily as per daughter this is most recent dose. This was verified in care everywhere. TSH here 0.078 - IV synthroid 100mc64mily while NPO, would consider decreasing this dose.  HFpEF / HTN / HLD.  BP stable this am. Echo in 04/2014 with EF 50-55% and grade 1  diastolic dysfunction. No lipid panel since 2014.  - Carefully monitor fluid status - Holding home bisprolol in setting of hypotension.  - Consider starting statin and ASA therapy  Severe protein malnutrition.  Albumin 3.4> 2.2 PEG tube in place -c/s nutrition -?  Recurrence/mets of cancer  Alcohol Abuse hx - CIWA - Thiamine  FEN/GI: Resume tube feeds, SLIV Prophylaxis: SubQ heparin  Disposition: Likely home today.  Subjective:  Patient reports that he is doing ok.  He points to his just below his PEG site for discomfort today.  5/10, relieved by pain medications.  No nausea, vomiting, diarrhea.  Had a BM yesterday without difficulty.    Objective: Temp:  [98.7 F (37.1 C)-99.6 F (37.6 C)] 98.7 F (37.1 C) (05/27 0600) Pulse Rate:  [98-132] 106 (05/27 0600) Resp:  [16-19] 18 (05/27 0600) BP: (99-117)/(67-81) 99/67 mmHg (05/27 0600) SpO2:  [98 %-100 %] 100 % (05/27 0600) Weight:  [113 lb 12.8 oz (51.619 kg)-113 lb 12.8 oz (51.62 kg)] 113 lb 12.8 oz (51.62 kg) (05/27 0500) Physical Exam: General: awake, alert, thin, NAD HEENT: EOMI, healed external 1/8"x1/8" opening at suprasternal notch, hypopigmentation of skin in this area as well Cardiovascular: RRR, no murmus Respiratory: CTAB, no increased WOB Abdomen: flat, soft, minimal guarding on L side of abdomen just below PEG site.  PEG site with minimal drainage on dressing.  No purulence appreciated.  PEG feed being administered.  No evidence of peritonitis.  +BS x4 Extremities: Thin, warm, no bony deformities Skin: as above, no rashes Neuro: follows commands, no focal deficits  Laboratory:  Recent Labs Lab 07/29/14 1950 07/30/14 0534  WBC 7.4 6.5  HGB 8.4* 7.7*  HCT 26.0* 23.7*  PLT 392 341    Recent Labs Lab 07/28/14 1144 07/30/14 0534  NA 133* 133*  K 4.9 4.1  CL 97 102  CO2 24 21*  BUN 22 16  CREATININE 0.65 0.66  CALCIUM 9.1 8.5*  PROT 7.9 7.3  BILITOT 0.4 0.5  ALKPHOS 347* 254*  ALT 79* 54  AST 46* 32  GLUCOSE 96 92   HepB SAn neg HCV Ab neg HepA IgM nonreactive HepB IgM nonreactive  Cortisol 1.6  Imaging/Diagnostic Tests: No results found. Janora Norlander, DO 07/31/2014, 9:26 AM PGY-1, [place] Intern pager: [phone],  text pages welcome

## 2014-07-31 NOTE — Progress Notes (Signed)
Patient [name]      DOB: [birth year]      RAQ:[id number]   Palliative Medicine Team at [facility] Progress Note    Subjective: Abdominal pain doing a bit better today. Now 6/10. No nausea/vomiting    Filed Vitals:   07/31/14 1004  BP: 108/75  Pulse: 111  Temp: 99.2 F (37.3 C)  Resp: 18   Physical exam: GEN: alert, NAD HEENT: [name], sclera anicteric CV: tachy   CBC    Component Value Date/Time   WBC 6.5 07/30/2014 0534   WBC 4.3 11/04/2009 1559   RBC 2.81* 07/30/2014 0534   RBC 4.17* 11/04/2009 1559   HGB 7.7* 07/30/2014 0534   HGB 12.4* 11/04/2009 1559   HCT 23.7* 07/30/2014 0534   HCT 38.0* 11/04/2009 1559   PLT 341 07/30/2014 0534   PLT 169 11/04/2009 1559   MCV 84.3 07/30/2014 0534   MCV 91.2 11/04/2009 1559   MCH 27.4 07/30/2014 0534   MCHC 32.5 07/30/2014 0534   MCHC 32.6 11/04/2009 1559   RDW 17.2* 07/30/2014 0534   RDW 17.2* 11/04/2009 1559   LYMPHSABS 1.8 05/27/2014 1646   LYMPHSABS 2.1 11/04/2009 1559   MONOABS 1.1* 05/27/2014 1646   MONOABS 0.4 11/04/2009 1559   EOSABS 0.1 05/27/2014 1646   EOSABS 0.1 11/04/2009 1559   BASOSABS 0.0 05/27/2014 1646   BASOSABS 0.0 11/04/2009 1559    CMP     Component Value Date/Time   NA 133* 07/30/2014 0534   K 4.1 07/30/2014 0534   CL 102 07/30/2014 0534   CO2 21* 07/30/2014 0534   GLUCOSE 92 07/30/2014 0534   BUN 16 07/30/2014 0534   CREATININE 0.66 07/30/2014 0534   CREATININE 0.65 07/28/2014 1144   CALCIUM 8.5* 07/30/2014 0534   PROT 7.3 07/30/2014 0534   ALBUMIN 2.2* 07/30/2014 0534   AST 32 07/30/2014 0534   ALT 54 07/30/2014 0534   ALKPHOS 254* 07/30/2014 0534   BILITOT 0.5 07/30/2014 0534   GFRNONAA >60 07/30/2014 0534   GFRAA >60 07/30/2014 0534       Assessment and plan: 64 yo male with PMHx of laryngeal CA s/p resection in 2011 (per [name] ENT note), HTN, CHF, etoh abuse admitted with abdominal pain  Palliative Care Discussion Held Today Contacts/Participants in  Discussion: Daughter [name] Primary Decision Maker: Patient [name] is daughter [name]  Met with [name] and his daughter [name].  He lives with [name] and she has exceptional knowledge of his situation as she attends appointments with him regularly. The latest they have heard from [facility] is that his treatment has gone really well, but they have seen some setbacks too. Largely his setbacks are related to hypotension which again is issue here, but did appear to have admission for neutropenic fever with chemo recently as well. He has completed planned course of chemo/XRT and physicians at [facility] have been pleased with progress.  THey tell me with follow-up imaging, physicians have expressed home that tumor has possibly disappeared on imaging vs considerably shrunk which has already been apparent to them.  Now that he has completed radiation they are hopeful his oral nutrition will improve with goal of removing PEG down the road.  We talked some about concerns related to adrenal insufficiency and they are of course hopeful that this fixes blood pressure problem which has related to so many of his admissions.  During his treatment he would go to clinic visits independetly ambulatory and doing quite well. They report his functional decline  has been precipitous over past few months.  They have already called [facility] to reschedule appointments that he missed while in hospital.  Given his probable adrenal insufficiency, high dose of levothyroxine (especially for his size), may be beneficial for outpatient endocrinology evaluation as well.    If admitted in future would ask that we be re consulted.   Code Status/Advance Care Planning:  Full  Symptom Management:   Abdominal Pain- Improving.  COntinue fent patch. I think it would be fine to allow him up to 52m of PRN oxycodone for time being if pain does persist.  Improving.   Cancer Related Pain- [facility]    Psycho-social/Spiritual:   Support System: 4 children and currently [place] main support system and has been attending appointments for his care.   Prognosis: Unable to determine  Discharge Planning: Home when medically stable  Total time: 45 minutes >50% of time spent in counseling and coordination of care regarding above assessment and plan  ADoran ClayD.O. Palliative Medicine [facility] Pager: [phone] Phone: [phone]

## 2014-07-31 NOTE — Evaluation (Signed)
Clinical/Bedside Swallow Evaluation Patient Details  Name: [name] MRN: [id number] Date of Birth: January 15, 1951  Today's Date: 07/31/2014 Time: SLP Start Time (ACUTE ONLY): 1116 SLP Stop Time (ACUTE ONLY): 1129 SLP Time Calculation (min) (ACUTE ONLY): 13 min  Past Medical History:  Past Medical History  Diagnosis Date  . Hypertension   . Cirrhosis of liver   . Hypothyroidism   . Alcohol abuse   . CHF (congestive heart failure)     EF 45-50% 05/2012  . Hyperlipidemia   . Arthritis   . Laryngeal cancer 2011    Laryngectomy, [id number]  . Seizures    Past Surgical History:  Past Surgical History  Procedure Laterality Date  . Tracheal surgery    . Tracheostomy    . Left and right heart catheterization with coronary angiogram N/A 12/28/2011    Procedure: LEFT AND RIGHT HEART CATHETERIZATION WITH CORONARY ANGIOGRAM;  Surgeon: Jolaine Artist, MD;  Location: [facility] CATH LAB;  Service: Cardiovascular;  Laterality: N/A;  . Esophagogastroduodenoscopy N/A 04/10/2014    Procedure: ESOPHAGOGASTRODUODENOSCOPY (EGD);  Surgeon: Missy Sabins, MD;  Location: [facility] ENDOSCOPY;  Service: Endoscopy;  Laterality: N/A;   HPI:  [name] is a 64 y.o. male presenting with abdominal pain, distention, and PEG tube drainage for 1.5 weeks. PMH is significant for laryngeal carcinoma (s/p total laryngectomy), SCC of mouth (s/p chemo/XRT), PEG tube, HFpEF, hypothyroidism, chronic pancreatitis, ?cirrhosis, h/o alcohol abuse, HTN, and HLD. Pt/daughter report that while he has some odynophagia, he is able to eat/drink by mouth at home in addition to TFs.   Assessment / Plan / Recommendation Clinical Impression  Pt has a h/o total laryngectomy and therefore is anatomically unable to aspirate. His oral phase is marked by slow mastication and transit due to lack of dentition. Would suspect an element of xerostomia as well given h/o XRT. Pt uses purees and thin liquids to facilitate with manipulation and posterior  propulsion with Mod I. Recommend regular diet textures and thin liquids. Educated pt/daughter that softer, moister foods will facilitate oral preparation, however encouraged him to order foods that he knows he can handle. No SLP f/u indicated.    Aspiration Risk  None    Diet Recommendation Age appropriate regular solids;Thin   Medication Administration: Whole meds with liquid Compensations: Slow rate;Small sips/bites    Other  Recommendations Oral Care Recommendations: Oral care BID   Follow Up Recommendations       Frequency and Duration        Pertinent Vitals/Pain n/a    SLP Swallow Goals     Swallow Study Prior Functional Status       General Other Pertinent Information: [name] is a 64 y.o. male presenting with abdominal pain, distention, and PEG tube drainage for 1.5 weeks. PMH is significant for laryngeal carcinoma (s/p total laryngectomy), SCC of mouth (s/p chemo/XRT), PEG tube, HFpEF, hypothyroidism, chronic pancreatitis, ?cirrhosis, h/o alcohol abuse, HTN, and HLD. Pt/daughter report that while he has some odynophagia, he is able to eat/drink by mouth at home in addition to TFs. Type of Study: Bedside swallow evaluation Previous Swallow Assessment: previous BSE recommending solid/liquid POs as tolerated Diet Prior to this Study: NPO;PEG tube Temperature Spikes Noted: Yes (99.6) Respiratory Status: Room air History of Recent Intubation: No Behavior/Cognition: Alert;Cooperative;Pleasant mood Oral Cavity - Dentition: Edentulous Self-Feeding Abilities: Able to feed self Patient Positioning: Upright in bed Baseline Vocal Quality: Aphonic (secondary to total laryngectomy)    Oral/Motor/Sensory Function Overall Oral Motor/Sensory Function: Appears  within functional limits for tasks assessed   Ice Chips Ice chips: Not tested   Thin Liquid Thin Liquid: Within functional limits Presentation: Self Fed;Straw    Nectar Thick Nectar Thick Liquid: Not tested    Honey Thick Honey Thick Liquid: Not tested   Puree Puree: Within functional limits Presentation: Self Fed;Spoon   Solid   GO Functional Assessment Tool Used: skilled clinical judgment Functional Limitations: Swallowing Swallow Current Status (C7893): At least 1 percent but less than 20 percent impaired, limited or restricted Swallow Goal Status [phone]): At least 1 percent but less than 20 percent impaired, limited or restricted Swallow Discharge Status [phone]): At least 1 percent but less than 20 percent impaired, limited or restricted  Solid: Within functional limits (given pt is edentulous) Presentation: Self Fed        Germain Osgood, M.A. CCC-SLP [phone]  Germain Osgood 07/31/2014,12:11 PM

## 2014-07-31 NOTE — Telephone Encounter (Signed)
Thank you Tamika,  Re: thyroid medication, I had started him on 149mcg IV (which is equivalent to 282mcg oral). Yes, she should speak with inpatient team with questions as I was just the admitting doctor and do not know if plans have changed over the course of today. [name] nurse can help her get in touch with inpatient team.  Hilton Sinclair, MD

## 2014-07-31 NOTE — Discharge Summary (Signed)
[facility] Discharge Summary  Patient name: [name] Medical record number: [id number] Date of birth: 1950-08-23 Age: 64 y.o. Gender: male Date of Admission: 07/29/2014  Date of Discharge: 08/01/14 Admitting Physician: Alveda Reasons, MD  Primary Care Provider: Howard Pouch, DO Consultants: palliative medicine  Indication for Hospitalization: abdominal pain with concern for SBO  Discharge Diagnoses/Problem List:  HTN Hypothyroidism Chronic Systolic heart failure Adrenal insufficiency Abdominal pain Cancer associated pain  Disposition: Discharge home with daughter  Discharge Condition: Stable  Discharge Exam:  Blood pressure 110/72, pulse 94, temperature 98.8 F (37.1 C), temperature source Oral, resp. rate 18, weight 113 lb 1.6 oz (51.302 kg), SpO2 98 %.  Gen: awake, alert, thin male, NAD HEENT: EOMI, healed external 1/8"x1/8" opening at suprasternal notch, hypopigmentation of skin in this area as well Cardiovascular: RRR, no murmus Respiratory: CTAB, no increased WOB Abdomen: flat, soft, minimal guarding on L side of abdomen just below PEG site. PEG site with minimal drainage on dressing.  Appears to be from feed. No purulence appreciated. No evidence of peritonitis. +BS x4 Extremities: Thin, warm, no bony deformities Skin: as above, no rashes Neuro: follows commands, no focal deficits, cannot speak but can mouth words Psych: good eye contact, responds appropriately  Brief Hospital Course:  [name] is a 64 y.o. male that presented as a direct admission from clinic with abdominal pain, distension, and plain film consistent with SBO vs ileus.  On admission, CBC unremarkable for infection, CMET with Na 133, Albumin 3.4, Alk phos 347, ALT 79, AST 46.  Elevated transaminases and alk phos was attributed to cirrhosis and h/o carcinoma with recent radiation and chemotherapy.  On exam, patient tachycardic to 111, BP 108/79, abdomen  with PEG tube in place with small amount of non infected appearing drainage, +TTP around PEG site but no guarding or rebound, +tympany to percussion.  CT abdomen was obtained that was negative for SBO but did show dilatation of renal collecting systems (R>L) and asymmetric bladder wall thickening.  Patient was voiding independently but noted that he was having frequency.  Post void residual <200cc.  Prostate exam was performed and patient noted to have enlarged, non tender, non nodular prostate.  He was started on Flomax, which he tolerated well.  Urology was consulted who had no acute recommendations but recommended consideration for outpatient evaluation for further assessment.       In light of patient's chronic tachycardia and hypotension, a cortisol level was obtained on admission and found to be 1.6.  Adrenal insufficiency was thought to be 2/2 longstanding Megace use, but per daughter's report patient has not received Megace since the week prior.  For this reason, patient was started on Hydrocortisone tablets.  In addition, his Bisoprolol was restarted at a decreased dose of 68m.  His blood pressures tolerated this dose well.  Patient was also found to have a TSH of 0.078.  His daughter reported that he was initially on Synthroid 2033m and that he'd recently been decreased to 17539m  He was continued on the 175m38mosing.  However, I suspect that patient will need this dose may need to be decreased further, as this may also be contributing to his tachycardia.  Palliative care medicine was also consulted during this hospitalization to further evaluate goals of care.  Patient and daughter continue to ask for all interventions to preserve patient's life.  They are working closely with [facility] care.  However, it is unclear as  to whether they have established with palliative medicine there.    Patient discharged in stable condition into the care of his daughter.  Discharge instructions/  recommendations reviewed with daughter.  Daughter to schedule follow up with [facility] for PEG tube evaluation, as it was noted to be leaking during hospitalization.     Issues for Follow Up:  1. Cortisol level.  Recommend retesting and titration of hydrocortisone in 2 weeks. 2. Thyroid/TSH.  TSH still low in hospital.  Daughter reporting Synthroid 200 initially then 175 over the last month.  Did not decrease Synthroid but suspect that this will need to be decreased (may be a factor in increased HR).  3. Would consider endocrinology referral for Thyroid and cortisol management/maintenence  4. PEG tube.  Leakage appreciated.  Patient instructed to follow up with [facility] for PEG evaluation/replacement.  Significant Procedures: none  Significant Labs and Imaging:   Recent Labs Lab 07/29/14 1950 07/30/14 0534 07/31/14 1525  WBC 7.4 6.5 4.7  HGB 8.4* 7.7* 8.0*  HCT 26.0* 23.7* 25.0*  PLT 392 341 303    Recent Labs Lab 07/28/14 1144 07/30/14 0534 07/31/14 1525  NA 133* 133* 134*  K 4.9 4.1 3.6  CL 97 102 101  CO2 24 21* 24  GLUCOSE 96 92 81  BUN '22 16 14  ' CREATININE 0.65 0.66 0.65  CALCIUM 9.1 8.5* 8.7*  ALKPHOS 347* 254* 283*  AST 46* 32 29  ALT 79* 54 46  ALBUMIN 3.4* 2.2* 2.2*   Ct Abdomen Pelvis W Contrast  07/30/2014   CLINICAL DATA:  Abdominal pain near the umbilicus. Concern for small bowel obstruction or ileus. History of laryngeal cancer.  EXAM: CT ABDOMEN AND PELVIS WITH CONTRAST  TECHNIQUE: Multidetector CT imaging of the abdomen and pelvis was performed using the standard protocol following bolus administration of intravenous contrast.  CONTRAST:  34m OMNIPAQUE IOHEXOL 300 MG/ML  SOLN  COMPARISON:  Abdominal radiographs 07/28/2014 and abdominal CT 04/09/2014  FINDINGS: Stable calcified nodule in the left lower lobe on sequence 3, image 3. Mild dependent atelectasis at the lung bases without pleural effusions. Again noted are small subtle nodular densities at the lung  bases which are unchanged.  No evidence for free intraperitoneal air. Patient now has a balloon retention gastrostomy tube. Normal appearance of the liver, gallbladder and portal venous system. Chronic calcifications of the pancreatic head compatible with old pancreatitis. Stable dilatation of the main pancreatic duct measuring up to 3 mm. Normal appearance of the spleen.  Normal appearance of the adrenal glands. Again noted is a 7 mm stone in the right kidney lower pole and there is moderate right hydroureteronephrosis. There has been chronic dilatation of the right renal pelvis but the caliceal dilatation has increased from the prior examination. Mild dilatation of the right ureter without a stone. There is concern for a small dense lesion or area of thickening at the left ureterovesical junction, best seen on sequence 2, image 72. There is chronic dilatation of the left renal pelvis probably representing an extrarenal pelvis. No significant dilatation of the left ureter. Chronic punctate stone along the left kidney lower pole. There is mild to moderate distention of the urinary bladder and there are subtle areas of asymmetric wall thickening in the urinary bladder along the trigone and right side of the bladder.  Atherosclerotic calcifications in the abdominal aorta without aneurysm. No significant free fluid or lymphadenopathy.  No acute abnormality in the large or small bowel. There is no significant bowel  dilatation to suggest obstruction or ileus. Again noted is sclerosis along the superior femoral heads suggesting areas of AVN without collapse.  IMPRESSION: There is no evidence to suggest a bowel obstruction or ileus.  There is dilatation of the renal collecting systems, right side greater the left. Review of prior examinations suggests that this has been a chronic finding but there appears to be increased distension of the right renal collecting system and right ureter of uncertain etiology. There is no  clear evidence for an obstructing stone. However, there is mild asymmetric wall thickening in the urinary bladder along the right side, trigone region and possibly near the left ureterovesical junction. Consider a urology consultation if the patient has not been evaluated in the past.  Interval placement of gastrostomy tube without complicating features.  Chronic calcifications at the pancreatic head suggesting old inflammation.  Nonobstructive bilateral renal calculi.   Electronically Signed   By: Markus Daft M.D.   On: 07/30/2014 08:25    Results/Tests Pending at Time of Discharge: none  Discharge Medications:    Medication List    STOP taking these medications        megestrol 40 MG/ML suspension  Commonly known as:  MEGACE      TAKE these medications        aspirin EC 81 MG tablet  Take 81 mg by mouth daily.     bisoprolol 5 MG tablet  Commonly known as:  ZEBETA  Take 1 tablet (5 mg total) by mouth daily.     feeding supplement (JEVITY 1.5 CAL) Liqd  Place 70 mL/hr into feeding tube continuous.     fentaNYL 25 MCG/HR patch  Commonly known as:  DURAGESIC - dosed mcg/hr  Place 1 patch (25 mcg total) onto the skin every 3 (three) days.     fentaNYL 12 MCG/HR  Commonly known as:  DURAGESIC - dosed mcg/hr  Place 12.5 mcg onto the skin every other day.     fluconazole 200 MG tablet  Commonly known as:  DIFLUCAN  Take 200 mg by mouth daily.     free water Soln  Place 175 mLs into feeding tube 4 (four) times daily.     guaiFENesin 600 MG 12 hr tablet  Commonly known as:  MUCINEX  Take 1 tablet (600 mg total) by mouth 2 (two) times daily.     hydrocortisone 5 MG tablet  Commonly known as:  CORTEF  Take 2 tablets (94m) each morning and 1 tablet (577m each evening     levothyroxine 175 MCG tablet  Commonly known as:  SYNTHROID, LEVOTHROID  Take 1 tablet (175 mcg total) by mouth daily before breakfast.     lidocaine 2 % solution  Commonly known as:  XYLOCAINE  Use as  directed 10 mLs in the mouth or throat 4 (four) times daily as needed for mouth pain.     magic mouthwash w/lidocaine Soln  Take 5 mLs by mouth 4 (four) times daily.     ondansetron 4 MG tablet  Commonly known as:  ZOFRAN  Take 1 tablet (4 mg total) by mouth every 8 (eight) hours as needed for nausea or vomiting.     oxyCODONE 5 MG/5ML solution  Commonly known as:  ROXICODONE  Take 5 mLs (5 mg total) by mouth every 4 (four) hours as needed for severe pain.     tamsulosin 0.4 MG Caps capsule  Commonly known as:  FLOMAX  Take 1 capsule (0.4 mg total) by mouth daily  after breakfast.        Discharge Instructions: Please refer to Patient Instructions section of EMR for full details.  Patient was counseled important signs and symptoms that should prompt return to medical care, changes in medications, dietary instructions, activity restrictions, and follow up appointments.   Follow-Up Appointments: Follow-up Information    Follow up with Kuneff, Renee, DO. Go on 08/13/2014.   Specialty:  Family Medicine   Why:  1:45pm hospital follow up   Contact information:   [place] [address] [phone]       Schedule an appointment as soon as possible for a visit with [facility].   Why:  PEG evaluation   Contact information:   [place] [address] [phone]       Ashly M Gottschalk, DO 08/01/2014, 10:07 AM PGY-1, [place]

## 2014-08-01 DIAGNOSIS — M1990 Unspecified osteoarthritis, unspecified site: Secondary | ICD-10-CM | POA: Diagnosis not present

## 2014-08-01 DIAGNOSIS — I10 Essential (primary) hypertension: Secondary | ICD-10-CM | POA: Diagnosis not present

## 2014-08-01 DIAGNOSIS — I5022 Chronic systolic (congestive) heart failure: Secondary | ICD-10-CM | POA: Diagnosis not present

## 2014-08-01 DIAGNOSIS — R000 Tachycardia, unspecified: Secondary | ICD-10-CM | POA: Diagnosis not present

## 2014-08-01 DIAGNOSIS — I959 Hypotension, unspecified: Secondary | ICD-10-CM | POA: Diagnosis not present

## 2014-08-01 DIAGNOSIS — E039 Hypothyroidism, unspecified: Secondary | ICD-10-CM | POA: Diagnosis not present

## 2014-08-01 DIAGNOSIS — R1033 Periumbilical pain: Secondary | ICD-10-CM | POA: Diagnosis not present

## 2014-08-01 DIAGNOSIS — E2749 Other adrenocortical insufficiency: Secondary | ICD-10-CM | POA: Diagnosis not present

## 2014-08-01 DIAGNOSIS — R1013 Epigastric pain: Secondary | ICD-10-CM | POA: Diagnosis not present

## 2014-08-01 DIAGNOSIS — C321 Malignant neoplasm of supraglottis: Secondary | ICD-10-CM | POA: Diagnosis not present

## 2014-08-01 LAB — GLUCOSE, CAPILLARY
Glucose-Capillary: 119 mg/dL — ABNORMAL HIGH (ref 65–99)
Glucose-Capillary: 138 mg/dL — ABNORMAL HIGH (ref 65–99)

## 2014-08-01 LAB — URINE CULTURE
COLONY COUNT: NO GROWTH
Culture: NO GROWTH

## 2014-08-01 NOTE — Discharge Instructions (Signed)
You were admitted because there was concern for small bowel obstruction.  A CT scan was done to evaluate this.  No small bowel obstruction was appreciated.  However, there was some bladder wall thickening appreciated.  Therefore, we recommend that you see a urologist outpatient for continued monitoring.  On exam, you prostate felt a little enlarged.  For this reason, you are being discharged with Flomax to help.  You were also noted to have a low Cortisol.  You are being discharged with Hydrocortisone tablets to take for this.  We will recheck your level in clinic.  You should continue to take Synthroid 175 for now.  This may be decreased by Dr Raoul Pitch as your thyroid level is not quite within normal limits.  You are also being discharged on a lowered dose of Bisoprolol for your heart rate.  You blood pressure tolerated this dose well during hospitalization.  You should schedule an appointment with [facility] hospital to evaluate your PEG tube since it is still leaking.   Abdominal Pain Many things can cause belly (abdominal) pain. Most times, the belly pain is not dangerous. Many cases of belly pain can be watched and treated at home. HOME CARE   Do not take medicines that help you go poop (laxatives) unless told to by your doctor.  Only take medicine as told by your doctor.  Eat or drink as told by your doctor. Your doctor will tell you if you should be on a special diet. GET HELP IF:  You do not know what is causing your belly pain.  You have belly pain while you are sick to your stomach (nauseous) or have runny poop (diarrhea).  You have pain while you pee or poop.  Your belly pain wakes you up at night.  You have belly pain that gets worse or better when you eat.  You have belly pain that gets worse when you eat fatty foods.  You have a fever. GET HELP RIGHT AWAY IF:   The pain does not go away within 2 hours.  You keep throwing up (vomiting).  The pain changes and is only in the  right or left part of the belly.  You have bloody or tarry looking poop. MAKE SURE YOU:   Understand these instructions.  Will watch your condition.  Will get help right away if you are not doing well or get worse. Document Released: 08/09/2007 Document Revised: 02/25/2013 Document Reviewed: 10/30/2012 [facility] Patient Information 2015 [place], [place]. This information is not intended to replace advice given to you by your health care provider. Make sure you discuss any questions you have with your health care provider.

## 2014-08-01 NOTE — Progress Notes (Signed)
[name] to be D/C'd Home per MD order.  Discussed prescriptions and follow up appointments with the patient. Prescriptions given to patient, medication list explained in detail. Pt verbalized understanding.    Medication List    STOP taking these medications        megestrol 40 MG/ML suspension  Commonly known as:  MEGACE      TAKE these medications        aspirin EC 81 MG tablet  Take 81 mg by mouth daily.     bisoprolol 5 MG tablet  Commonly known as:  ZEBETA  Take 1 tablet (5 mg total) by mouth daily.     feeding supplement (JEVITY 1.5 CAL) Liqd  Place 70 mL/hr into feeding tube continuous.     fentaNYL 25 MCG/HR patch  Commonly known as:  DURAGESIC - dosed mcg/hr  Place 1 patch (25 mcg total) onto the skin every 3 (three) days.     fentaNYL 12 MCG/HR  Commonly known as:  DURAGESIC - dosed mcg/hr  Place 12.5 mcg onto the skin every other day.     fluconazole 200 MG tablet  Commonly known as:  DIFLUCAN  Take 200 mg by mouth daily.     free water Soln  Place 175 mLs into feeding tube 4 (four) times daily.     guaiFENesin 600 MG 12 hr tablet  Commonly known as:  MUCINEX  Take 1 tablet (600 mg total) by mouth 2 (two) times daily.     hydrocortisone 5 MG tablet  Commonly known as:  CORTEF  Take 2 tablets (10mg ) each morning and 1 tablet (5mg ) each evening     levothyroxine 175 MCG tablet  Commonly known as:  SYNTHROID, LEVOTHROID  Take 1 tablet (175 mcg total) by mouth daily before breakfast.     lidocaine 2 % solution  Commonly known as:  XYLOCAINE  Use as directed 10 mLs in the mouth or throat 4 (four) times daily as needed for mouth pain.     magic mouthwash w/lidocaine Soln  Take 5 mLs by mouth 4 (four) times daily.     ondansetron 4 MG tablet  Commonly known as:  ZOFRAN  Take 1 tablet (4 mg total) by mouth every 8 (eight) hours as needed for nausea or vomiting.     oxyCODONE 5 MG/5ML solution  Commonly known as:  ROXICODONE  Take 5 mLs (5 mg  total) by mouth every 4 (four) hours as needed for severe pain.     tamsulosin 0.4 MG Caps capsule  Commonly known as:  FLOMAX  Take 1 capsule (0.4 mg total) by mouth daily after breakfast.        Filed Vitals:   08/01/14 0832  BP: 110/72  Pulse: 94  Temp: 98.8 F (37.1 C)  Resp: 18    Skin clean, dry and intact without evidence of skin break down, no evidence of skin tears noted. IV catheter discontinued intact. Site without signs and symptoms of complications. Dressing and pressure applied. Pt denies pain at this time. No complaints noted.  An After Visit Summary was printed and given to the patient. Patient escorted via [place], and D/C home via private auto.  [name] A 08/01/2014 12:46 PM

## 2014-08-05 LAB — CULTURE, BLOOD (ROUTINE X 2)
Culture: NO GROWTH
Culture: NO GROWTH

## 2014-08-06 DIAGNOSIS — C321 Malignant neoplasm of supraglottis: Secondary | ICD-10-CM | POA: Diagnosis not present

## 2014-08-06 DIAGNOSIS — K861 Other chronic pancreatitis: Secondary | ICD-10-CM | POA: Diagnosis not present

## 2014-08-06 DIAGNOSIS — R1310 Dysphagia, unspecified: Secondary | ICD-10-CM | POA: Diagnosis not present

## 2014-08-06 DIAGNOSIS — I5022 Chronic systolic (congestive) heart failure: Secondary | ICD-10-CM | POA: Diagnosis not present

## 2014-08-06 DIAGNOSIS — C140 Malignant neoplasm of pharynx, unspecified: Secondary | ICD-10-CM | POA: Diagnosis not present

## 2014-08-06 DIAGNOSIS — E43 Unspecified severe protein-calorie malnutrition: Secondary | ICD-10-CM | POA: Diagnosis not present

## 2014-08-11 ENCOUNTER — Telehealth: Payer: Self-pay | Admitting: Family Medicine

## 2014-08-11 DIAGNOSIS — I5022 Chronic systolic (congestive) heart failure: Secondary | ICD-10-CM | POA: Diagnosis not present

## 2014-08-11 DIAGNOSIS — C140 Malignant neoplasm of pharynx, unspecified: Secondary | ICD-10-CM | POA: Diagnosis not present

## 2014-08-11 DIAGNOSIS — K861 Other chronic pancreatitis: Secondary | ICD-10-CM | POA: Diagnosis not present

## 2014-08-11 DIAGNOSIS — R1310 Dysphagia, unspecified: Secondary | ICD-10-CM | POA: Diagnosis not present

## 2014-08-11 DIAGNOSIS — E43 Unspecified severe protein-calorie malnutrition: Secondary | ICD-10-CM | POA: Diagnosis not present

## 2014-08-11 DIAGNOSIS — C321 Malignant neoplasm of supraglottis: Secondary | ICD-10-CM | POA: Diagnosis not present

## 2014-08-11 NOTE — Telephone Encounter (Signed)
[facility]: went on home visit todauy while sitting BP was 98/68 Standing: 86/64 Last week hospital put him on low dose of  Bisoprolol 5mg  for his heart rate In hospital BP was tolerating this Nurse afraid BP is being brought down too low During visit, heartrate was 81

## 2014-08-11 NOTE — Telephone Encounter (Signed)
Receive notification from [facility] that on home visit patient's blood pressure was 98/68 sitting and then 86/64 standing. Home nurse was worried that his blood pressure was too low. Patient is on this peripheral 5 mg for his heart rate, heart rate was 81. - [name] blood pressure could be lower than it was in the hospital secondary to not enough intake of fluids. If patient is symptomatic (chest pain, dizziness, presyncope) then he needs to come in for an appointment or be seen in the emergency room immediately, otherwise she can change this Bisoprolol to  2.5 mg and monitor blood pressure and heart rate.  - Make certain patient is well hydrated.   - Any additional concerns he should be seen in the office. Thank you.  [place] DO PGY3

## 2014-08-12 ENCOUNTER — Telehealth: Payer: Self-pay | Admitting: Family Medicine

## 2014-08-12 DIAGNOSIS — Z5189 Encounter for other specified aftercare: Secondary | ICD-10-CM | POA: Diagnosis not present

## 2014-08-12 DIAGNOSIS — R490 Dysphonia: Secondary | ICD-10-CM | POA: Diagnosis not present

## 2014-08-12 NOTE — Telephone Encounter (Signed)
Will forward to MD to give ok for this. Jazmin Hartsell,CMA

## 2014-08-12 NOTE — Telephone Encounter (Signed)
Please continue speech therapy. Thanks

## 2014-08-12 NOTE — Telephone Encounter (Signed)
LM on confidential VM with orders for patient. Jazmin Hartsell,CMA

## 2014-08-12 NOTE — Telephone Encounter (Signed)
Spoke with patient's daughter [name] regarding pt's blood pressure being low.  Advised her to make sure he is getting plenty of fluids.  Per [name] patient is getting water through Peg tube and by mouth to the point is he is getting uncomfortable.  Told her that I, Tamika, RN was waiting for Jinny Blossom, RN to return phone.  If patient was having any symptoms dizziness, presyncope or chest pain to make an appt with [facility] or go to the ED ASAP.  [name] stated patient is doing ok, but blood pressures are still low.  PCP made change to Bisoprolol to 2.5 mg and to coninue to monitor blood pressure.  Derl Barrow, RN

## 2014-08-12 NOTE — Telephone Encounter (Signed)
Left voice message for [name] to call nurse back.  Derl Barrow, RN

## 2014-08-12 NOTE — Telephone Encounter (Signed)
Needs verbal orders to continue speech therapy

## 2014-08-13 ENCOUNTER — Encounter: Payer: Self-pay | Admitting: Family Medicine

## 2014-08-13 ENCOUNTER — Ambulatory Visit (INDEPENDENT_AMBULATORY_CARE_PROVIDER_SITE_OTHER): Payer: Medicare Other | Admitting: Family Medicine

## 2014-08-13 VITALS — BP 122/88 | HR 106 | Temp 98.4°F | Ht 71.0 in | Wt 118.2 lb

## 2014-08-13 DIAGNOSIS — R140 Abdominal distension (gaseous): Secondary | ICD-10-CM

## 2014-08-13 DIAGNOSIS — R42 Dizziness and giddiness: Secondary | ICD-10-CM | POA: Diagnosis present

## 2014-08-13 DIAGNOSIS — IMO0001 Reserved for inherently not codable concepts without codable children: Secondary | ICD-10-CM

## 2014-08-13 DIAGNOSIS — E2749 Other adrenocortical insufficiency: Secondary | ICD-10-CM | POA: Diagnosis not present

## 2014-08-13 DIAGNOSIS — E039 Hypothyroidism, unspecified: Secondary | ICD-10-CM

## 2014-08-13 DIAGNOSIS — I951 Orthostatic hypotension: Secondary | ICD-10-CM | POA: Insufficient documentation

## 2014-08-13 DIAGNOSIS — R531 Weakness: Secondary | ICD-10-CM | POA: Diagnosis not present

## 2014-08-13 NOTE — Assessment & Plan Note (Addendum)
[name] is a 64 y.o. African-American male with extensive medical history. Recently admitted for abdominal pain. PMH is significant for laryngeal carcinoma, SCC of mouth, PEG tube, HFpEF, hypothyroidism, chronic pancreatitis, ?cirrhosis, h/o alcohol abuse, HTN, and HLD.  - Weakness/dizziness: Patient appears to be more weak, having more dizziness with attempts at ambulation not only with just standing but intermittently throughout the day. Will order PT/OT evaluation since it had been discontinued since his discharge. Patient may need more conditioning versus may need fitted for wheelchair if he is unable to tolerate walking distances. - Handicap placard filled out today for patient. - Will contact patient's home health nurse, and hopefully be able to have the nurse scheduled at least twice a week. We'll also see if we can get more assistance if able.  - Adrenal insufficiency: We'll collect cortisol level today, if still low will increase Cortef. Endocrinology referral was placed today for adrenal insufficiency and   -- Orthostatic positive: Patient was orthostatic positive for heart rate only. Blood pressure remained stable. Dehydration does not seem to be a factor for him.  discontinued Flomax starting Saturday. We'll keep Coreg at 2.5 mg for now, with the need to increase if his heart rate remains high. Will await home health nurse recordings of vitals on Monday to make further decision. Could consider addition of Midodrine although on orthostatics today his blood pressure remained stable. Patient seemed weak and shaky upon standing, making me feel he needs more physical therapy.

## 2014-08-13 NOTE — Assessment & Plan Note (Addendum)
TAKAHIRO GODINHO is a 64 y.o. African-American male with extensive medical history. Recently admitted for abdominal pain. PMH is significant for laryngeal carcinoma, SCC of mouth, PEG tube, HFpEF, hypothyroidism, chronic pancreatitis, ?cirrhosis, h/o alcohol abuse, HTN, and HLD.  - Weakness/dizziness: Patient appears to be more weak, having more dizziness with attempts at ambulation not only with just standing but intermittently throughout the day. Will order PT/OT evaluation since it had been discontinued since his discharge. Patient may need more conditioning versus may need fitted for wheelchair if he is unable to tolerate walking distances. - Handicap placard filled out today for patient. - Will contact patient's home health nurse, and hopefully be able to have the nurse scheduled at least twice a week. We'll also see if we can get more assistance if able.  - Adrenal insufficiency: We'll collect cortisol level today, if still low will increase Cortef. Endocrinology referral was placed today for adrenal insufficiency and hypothyroid.  - Orthostatic positive: Patient was orthostatic positive for heart rate only. Blood pressure remained stable. Dehydration does not seem to be a factor for him.  discontinued Flomax starting Saturday. We'll keep Coreg at 2.5 mg for now, with the need to increase if his heart rate remains high. Will await home health nurse recordings of vitals on Monday to make further decision. Could consider addition of Midodrine although on orthostatics today his blood pressure remained stable. Patient seemed weak and shaky upon standing, making me feel he needs more physical therapy.

## 2014-08-13 NOTE — Progress Notes (Signed)
Subjective:    [name], male    DOB: Jun 11, 1950, 64 y.o.   MRN: [id number]  HPI  Hospital follow-up: Patient presents to family medicine clinic for hospital follow-up with his sister and his daughter [name]. Patient submitted May 25, directly from clinic for abdominal pain with distention. CT of the abdomen and pelvis was normal, as there were some concerns for metastasis of his laryngeal cancer. PEG site did not appear infected at that time. She'll continue 70 mL per hour continuous feeds, will along with approximately exceed ounces of water total daily flushed. Patient continues to have some abdominal discomfort with the amount of flush for the water. He is urinating well. They are continuing the Cortef however he still finding has felt dizzy upon standing, and with walking on occasions. He is weak with standing and feel shaky. He has had some issues with low blood pressures from home health nursing into the 34H systolic. He is continued on 175 g for his hypothyroid. Patient says the feeling is like she is going to pass out when he stands, not spinning or vertigo. Patient hemoglobin was 8 on discharge, which is just mildly lower than his baseline.  Adrenal insufficiency: Cortisol was 1.6 on 07/30/2014. Patient has been started on Cortef 10 mg in the morning 5 mg in the evening. He does not feel like this improved symptoms at this time.   Former smoker Past Medical History  Diagnosis Date  . Hypertension   . Cirrhosis of liver   . Hypothyroidism   . Alcohol abuse   . CHF (congestive heart failure)     EF 45-50% 05/2012  . Hyperlipidemia   . Arthritis   . Laryngeal cancer 2011    Laryngectomy, [id number]  . Seizures    Allergies  Allergen Reactions  . Vicodin [Hydrocodone-Acetaminophen] Rash  . Other     Unknown anesthesia medicine caused cardiac arrest.  . Tylenol [Acetaminophen]     HBP -Liver Cirrhosis   Past Surgical History  Procedure Laterality Date  .  Tracheal surgery    . Tracheostomy    . Left and right heart catheterization with coronary angiogram N/A 12/28/2011    Procedure: LEFT AND RIGHT HEART CATHETERIZATION WITH CORONARY ANGIOGRAM;  Surgeon: Jolaine Artist, MD;  Location: [facility] CATH LAB;  Service: Cardiovascular;  Laterality: N/A;  . Esophagogastroduodenoscopy N/A 04/10/2014    Procedure: ESOPHAGOGASTRODUODENOSCOPY (EGD);  Surgeon: Missy Sabins, MD;  Location: [facility] ENDOSCOPY;  Service: Endoscopy;  Laterality: N/A;   Review of Systems Per HPI    Objective:   Physical Exam BP 122/88 mmHg  Pulse 106  Temp(Src) 98.4 F (36.9 C) (Oral)  Ht 5\' 11"  (1.803 m)  Wt 118 lb 3.2 oz (53.615 kg)  BMI 16.49 kg/m2 Gen: NAD. Appears thin, fatigued, African-American male. Pleasant. Good eye contact, responds appropriately. HEENT: AT. [place].  Bilateral eyes without injections or icterus. MMM. Tracheostomy. CV: Tachycardic, regular rhythm. No murmurs. Chest: CTAB, no wheeze or crackles Abd: Soft. Flat. NTND. BS present. No Masses palpated.  Ext: No erythema. No edema.  Skin: No rashes, purpura or petechiae.  Neuro: Unsteady gait upon standing, weakness lower extremities, using walker. PERLA. EOMi. Alert. Oriented.   Orthostatics positive with heart rate only    Assessment & Plan:  KEIRON IODICE is a 64 y.o. African-American male with extensive medical history. Recently admitted for abdominal pain. PMH is significant for laryngeal carcinoma, SCC of mouth, PEG tube, HFpEF, hypothyroidism, chronic pancreatitis, ?cirrhosis, h/o  alcohol abuse, HTN, and HLD.  - Weakness/dizziness: Patient appears to be more weak, having more dizziness with attempts at ambulation not only with just standing but intermittently throughout the day. Will order PT/OT evaluation since it had been discontinued since his discharge. Patient may need more conditioning versus may need fitted for wheelchair if he is unable to tolerate walking distances. - Handicap placard filled  out today for patient. - Will contact patient's home health nurse, and hopefully be able to have the nurse scheduled at least twice a week. We'll also see if we can get more assistance if able.  - Adrenal insufficiency: We'll collect cortisol level today, if still low will increase Cortef. Endocrinology referral was placed today for adrenal insufficiency and hypothyroid.  - Hypothyroid: Patient will need a repeat TSH at the end of July, current dose is 175 g. Patient has been over supplemented and we are attempting to titrate down, without under supplementing. Could be root of his tachycardia as well.  - Orthostatic positive: Patient was orthostatic positive for heart rate only. Blood pressure remained stable. Dehydration does not seem to be a factor for him.  discontinued Flomax starting Saturday. We'll keep Coreg at 2.5 mg for now, with the need to increase if his heart rate remains high. Will await home health nurse recordings of vitals on Monday to make further decision. Could consider addition of Midodrine although on orthostatics today his blood pressure remained stable. Patient seemed weak and shaky upon standing, making me feel he needs more physical therapy.  -  Abdominal distention: Patient having abdominal distention secondary to large water bolus flushes, patient's family asking for possible machine has the continuous feeds with the flush bag attached so that large boluses of water doesn't have to be given at one time. They are willing to pay out of pocket to some degree. We'll see if we can get this for them, will ask for nursing and social work assistance on this matter.

## 2014-08-13 NOTE — Assessment & Plan Note (Signed)
[name] is a 64 y.o. African-American male with extensive medical history. Recently admitted for abdominal pain. PMH is significant for laryngeal carcinoma, SCC of mouth, PEG tube, HFpEF, hypothyroidism, chronic pancreatitis, ?cirrhosis, h/o alcohol abuse, HTN, and HLD.  -  Abdominal distention: Patient having abdominal distention secondary to large water bolus flushes, patient's family asking for possible machine has the continuous feeds with the flush bag attached so that large boluses of water doesn't have to be given at one time. They are willing to pay out of pocket to some degree. We'll see if we can get this for them, will ask for nursing and social work assistance on this matter.

## 2014-08-13 NOTE — Assessment & Plan Note (Signed)
[name] is a 64 y.o. African-American male with extensive medical history. Recently admitted for abdominal pain. PMH is significant for laryngeal carcinoma, SCC of mouth, PEG tube, HFpEF, hypothyroidism, chronic pancreatitis, ?cirrhosis, h/o alcohol abuse, HTN, and HLD.  - Weakness/dizziness: Patient appears to be more weak, having more dizziness with attempts at ambulation not only with just standing but intermittently throughout the day. Will order PT/OT evaluation since it had been discontinued since his discharge. Patient may need more conditioning versus may need fitted for wheelchair if he is unable to tolerate walking distances. - Handicap placard filled out today for patient. - Will contact patient's home health nurse, and hopefully be able to have the nurse scheduled at least twice a week. We'll also see if we can get more assistance if able.  - Hypothyroid: Patient will need a repeat TSH at the end of July, current dose is 175 g. Patient has been over supplemented and we are attempting to titrate down, without under supplementing. Could be root of his tachycardia as well.

## 2014-08-13 NOTE — Assessment & Plan Note (Signed)
[name] is a 64 y.o. African-American male with extensive medical history. Recently admitted for abdominal pain. PMH is significant for laryngeal carcinoma, SCC of mouth, PEG tube, HFpEF, hypothyroidism, chronic pancreatitis, ?cirrhosis, h/o alcohol abuse, HTN, and HLD.  - Weakness/dizziness: Patient appears to be more weak, having more dizziness with attempts at ambulation not only with just standing but intermittently throughout the day. Will order PT/OT evaluation since it had been discontinued since his discharge. Patient may need more conditioning versus may need fitted for wheelchair if he is unable to tolerate walking distances. - Handicap placard filled out today for patient. - Will contact patient's home health nurse, and hopefully be able to have the nurse scheduled at least twice a week. We'll also see if we can get more assistance if able.

## 2014-08-13 NOTE — Patient Instructions (Signed)
I would like you to discontinue your Flomax. If you're unable to urinate on her own I would like you to restart it immediately. I will start to put together your PT/OT eval and treat, and if they suggest we will forward with attempting to get you evaluated for a wheelchair. I have placed the endocrine referral. We will get a cortisol level today and I will call you with the results once they become available. I will start processing the order to keep you the additional flush on your IV bag. I will also attempt to have your nurse come out 2 times a week again. Depending upon your cortisol level, and your dizziness we will decide when we need to see you again. You need to be seen in one month regardless.

## 2014-08-14 ENCOUNTER — Telehealth: Payer: Self-pay | Admitting: Clinical

## 2014-08-14 ENCOUNTER — Encounter: Payer: Self-pay | Admitting: Family Medicine

## 2014-08-14 ENCOUNTER — Telehealth: Payer: Self-pay | Admitting: Family Medicine

## 2014-08-14 DIAGNOSIS — R1310 Dysphagia, unspecified: Secondary | ICD-10-CM | POA: Diagnosis not present

## 2014-08-14 DIAGNOSIS — Z923 Personal history of irradiation: Secondary | ICD-10-CM | POA: Diagnosis not present

## 2014-08-14 DIAGNOSIS — C140 Malignant neoplasm of pharynx, unspecified: Secondary | ICD-10-CM | POA: Diagnosis not present

## 2014-08-14 DIAGNOSIS — E43 Unspecified severe protein-calorie malnutrition: Secondary | ICD-10-CM | POA: Diagnosis not present

## 2014-08-14 DIAGNOSIS — C321 Malignant neoplasm of supraglottis: Secondary | ICD-10-CM | POA: Diagnosis not present

## 2014-08-14 DIAGNOSIS — K861 Other chronic pancreatitis: Secondary | ICD-10-CM | POA: Diagnosis not present

## 2014-08-14 DIAGNOSIS — C051 Malignant neoplasm of soft palate: Secondary | ICD-10-CM | POA: Diagnosis not present

## 2014-08-14 DIAGNOSIS — Z931 Gastrostomy status: Secondary | ICD-10-CM | POA: Diagnosis not present

## 2014-08-14 DIAGNOSIS — I5022 Chronic systolic (congestive) heart failure: Secondary | ICD-10-CM | POA: Diagnosis not present

## 2014-08-14 DIAGNOSIS — R1084 Generalized abdominal pain: Secondary | ICD-10-CM

## 2014-08-14 LAB — CORTISOL: CORTISOL PLASMA: 10.5 ug/dL

## 2014-08-14 MED ORDER — HYDROCORTISONE 5 MG PO TABS: ORAL_TABLET | ORAL | Status: DC

## 2014-08-14 NOTE — Telephone Encounter (Signed)
I have printed out the order for DME: [employer] (brand: covidien) with continuous feed and continuous flush by pump And reason for protein malnutrition, and abdominal pain with large bolus water flush needed to maintain hydration. Will give to social work to follow up on.  I had put in an aide request yesterday, as well for PT/OT and Nurse. Please add aide if it did not go through, they are requesting twice weekly for baths. I did not specify that, but they could benefit for as much help as possible.    In addition cortisol level ~10. Discussed management with his daughter. He is to now take cortef 10 mg (2 tabs) in morning and 10 mg (2 tabs) in evening. She voiced understanding and new script was e-scribed to their pharmacy to reflect changes.   [place] DO PGY3 [facility]

## 2014-08-14 NOTE — Telephone Encounter (Signed)
Orders have been faxed to [facility].  Hunt Oris, MSW, [place]

## 2014-08-14 NOTE — Telephone Encounter (Signed)
CSW LVM for pts daughter to provide her information on applying for Medicaid. CSW will assist as needed when call is returned.  Hunt Oris, MSW, [place]

## 2014-08-14 NOTE — Addendum Note (Signed)
Addended by: Howard Pouch A on: 08/14/2014 03:14 PM   Modules accepted: Orders

## 2014-08-14 NOTE — Telephone Encounter (Signed)
CSW spoke with pts daughter regarding pt having an aide.Daughter has applied for Medicaid and unfortunately pt did not qualify. Daughter was informed that pts insurance would cover an aide to assist with baths 2x a week. CSW will request an order for an aide to assist with this. Daughter also informed CSW that the equipment she is requesting is, [employer] (brand: covidien) with continuous feed and continuous flush by pump. Daughter stated that last time pt did not qualify because it was not medically necessary however at this time she feels that it is. CSW will inform PCP of request.  Hunt Oris, MSW, LCSW [phone]

## 2014-08-14 NOTE — Telephone Encounter (Signed)
Error

## 2014-08-14 NOTE — Telephone Encounter (Signed)
-----   Message from Ma Hillock, DO sent at 08/13/2014  6:10 PM EDT ----- Constance Holster placed a PT OT home health nurse/aid request in today for this gentleman. He is progressively becoming more weak and dizzy. He currently has a nurse seeing him once a week, I would like to see this happen at least twice a week. Family may need some assistance with an aide as he is becoming more weak. He originally had PT/OT eval and treat after his discharge approximately 2 weeks ago from the hospital, but at that time he was doing okay. Has since become more weak and dizzy unable to ambulate or stand on his own. I would like to get this family as much is assistance as possible. This patient has laryngeal cancer, has just completed radiation treatments and has other multiple comorbidities status post adrenal insufficiency that is causing him to be dizzy and unstable on his feet.  They are also asking me for a change in his continuous feeding pump, that would allow it to also continuously flushed water so that they are not flushing water and large amounts. The large amount of water flushes is causing him to be distended and uncomfortable. I am not certain how to order this DME or what is called, I would appreciate any assistance she could give an how to order this for them.  Thank you, Debroah Baller

## 2014-08-17 ENCOUNTER — Telehealth: Payer: Self-pay | Admitting: Family Medicine

## 2014-08-17 DIAGNOSIS — C321 Malignant neoplasm of supraglottis: Secondary | ICD-10-CM | POA: Diagnosis not present

## 2014-08-17 DIAGNOSIS — E43 Unspecified severe protein-calorie malnutrition: Secondary | ICD-10-CM | POA: Diagnosis not present

## 2014-08-17 DIAGNOSIS — C140 Malignant neoplasm of pharynx, unspecified: Secondary | ICD-10-CM | POA: Diagnosis not present

## 2014-08-17 DIAGNOSIS — K861 Other chronic pancreatitis: Secondary | ICD-10-CM | POA: Diagnosis not present

## 2014-08-17 DIAGNOSIS — R1310 Dysphagia, unspecified: Secondary | ICD-10-CM | POA: Diagnosis not present

## 2014-08-17 DIAGNOSIS — I5022 Chronic systolic (congestive) heart failure: Secondary | ICD-10-CM | POA: Diagnosis not present

## 2014-08-17 NOTE — Telephone Encounter (Signed)
Was at pts home today Sitting BP was 124/82, standing 84/56 heartrate sitting was 90  Standing 101 He hasnt taken his flomax since Friday Please call megan with any changes or recommendations

## 2014-08-17 NOTE — Telephone Encounter (Signed)
Thank you for the report. No changes at this time.  Thanks.

## 2014-08-19 DIAGNOSIS — Z930 Tracheostomy status: Secondary | ICD-10-CM | POA: Diagnosis not present

## 2014-08-19 DIAGNOSIS — C140 Malignant neoplasm of pharynx, unspecified: Secondary | ICD-10-CM | POA: Diagnosis not present

## 2014-08-19 DIAGNOSIS — Z931 Gastrostomy status: Secondary | ICD-10-CM | POA: Diagnosis not present

## 2014-08-19 DIAGNOSIS — K7030 Alcoholic cirrhosis of liver without ascites: Secondary | ICD-10-CM | POA: Diagnosis not present

## 2014-08-19 DIAGNOSIS — C321 Malignant neoplasm of supraglottis: Secondary | ICD-10-CM | POA: Diagnosis not present

## 2014-08-19 DIAGNOSIS — K861 Other chronic pancreatitis: Secondary | ICD-10-CM | POA: Diagnosis not present

## 2014-08-19 DIAGNOSIS — J9620 Acute and chronic respiratory failure, unspecified whether with hypoxia or hypercapnia: Secondary | ICD-10-CM | POA: Diagnosis not present

## 2014-08-19 DIAGNOSIS — I5022 Chronic systolic (congestive) heart failure: Secondary | ICD-10-CM | POA: Diagnosis not present

## 2014-08-19 DIAGNOSIS — R1310 Dysphagia, unspecified: Secondary | ICD-10-CM | POA: Diagnosis not present

## 2014-08-19 DIAGNOSIS — E43 Unspecified severe protein-calorie malnutrition: Secondary | ICD-10-CM | POA: Diagnosis not present

## 2014-08-19 NOTE — Telephone Encounter (Signed)
If calculations are correct patient is receiving about 1350 ml (~45 ounces) of free water daily with his continuous feedings at 75ml/hour of Jevity 1.2 via PEG . Daughter was instructed to give 175 ml of free water flush QID per tube (total  about 24 ounces daily). In speaking with the daughter in clinic it sounded like she was giving about 50-60 ounces of free water flushes a day total and this was causing abdominal discomfort/pain. Patient most definitely has problems with easy dehydration causing some of his symptoms of dizziness and orthostatic hypotension, thus he is in need of adequate hydration. However, she may be giving excess water if she is not following the 175 ml QID flushes.  I tried to speak with her via phone today and there was no answer. If she calls back in please explain to only give 128ml free water flushes four times a day (total of about 24 ounces a day). I left this on her voicemail as well. We will ask Nutrition to check in on them as well and make certain it is being done properly and she is able to administer the flushes to him 4x a day and this not an issue and therefore giving more water less times a day.  I would also like to have the nurse draw labs in the home for him to check his electrolytes and blood count. Please see if we can a one time lab draw for him in the home for an CBC and CMET. Last Hgb was 8.0.  Nathaniel Breeze DO PGY3

## 2014-08-19 NOTE — Telephone Encounter (Signed)
CSW has sent a message to [facility] Rep requesting to have a nutritionist provide some education in the home and an RN to do blood draws. CSW will notify PCP of outcome.  Hunt Oris, MSW, [place]

## 2014-08-20 ENCOUNTER — Encounter: Payer: Self-pay | Admitting: Clinical

## 2014-08-20 DIAGNOSIS — K861 Other chronic pancreatitis: Secondary | ICD-10-CM | POA: Diagnosis not present

## 2014-08-20 DIAGNOSIS — C140 Malignant neoplasm of pharynx, unspecified: Secondary | ICD-10-CM | POA: Diagnosis not present

## 2014-08-20 DIAGNOSIS — I5022 Chronic systolic (congestive) heart failure: Secondary | ICD-10-CM | POA: Diagnosis not present

## 2014-08-20 DIAGNOSIS — R1310 Dysphagia, unspecified: Secondary | ICD-10-CM | POA: Diagnosis not present

## 2014-08-20 DIAGNOSIS — C321 Malignant neoplasm of supraglottis: Secondary | ICD-10-CM | POA: Diagnosis not present

## 2014-08-20 DIAGNOSIS — E43 Unspecified severe protein-calorie malnutrition: Secondary | ICD-10-CM | POA: Diagnosis not present

## 2014-08-20 NOTE — Progress Notes (Signed)
CSW informed by [facility] rep that pt will have a visit tomorrow. [facility] informed of families need for additional education on flushing and request for blood work to be done.  Hunt Oris, MSW, [place]

## 2014-08-21 ENCOUNTER — Telehealth: Payer: Self-pay | Admitting: Family Medicine

## 2014-08-21 DIAGNOSIS — R1310 Dysphagia, unspecified: Secondary | ICD-10-CM | POA: Diagnosis not present

## 2014-08-21 DIAGNOSIS — C321 Malignant neoplasm of supraglottis: Secondary | ICD-10-CM | POA: Diagnosis not present

## 2014-08-21 DIAGNOSIS — K861 Other chronic pancreatitis: Secondary | ICD-10-CM | POA: Diagnosis not present

## 2014-08-21 DIAGNOSIS — E43 Unspecified severe protein-calorie malnutrition: Secondary | ICD-10-CM | POA: Diagnosis not present

## 2014-08-21 DIAGNOSIS — I5022 Chronic systolic (congestive) heart failure: Secondary | ICD-10-CM | POA: Diagnosis not present

## 2014-08-21 DIAGNOSIS — C140 Malignant neoplasm of pharynx, unspecified: Secondary | ICD-10-CM | POA: Diagnosis not present

## 2014-08-21 LAB — HEPATIC FUNCTION PANEL
ALT: 33 U/L (ref 10–40)
AST: 50 U/L — AB (ref 14–40)
Alkaline Phosphatase: 117 U/L (ref 25–125)

## 2014-08-21 LAB — BASIC METABOLIC PANEL
BUN: 21 mg/dL (ref 4–21)
CREATININE: 0.6 mg/dL (ref 0.6–1.3)
Glucose: 90 mg/dL
Potassium: 4.1 mmol/L (ref 3.4–5.3)
Sodium: 140 mmol/L (ref 137–147)

## 2014-08-21 LAB — CBC AND DIFFERENTIAL
HCT: 31 % — AB (ref 41–53)
Hemoglobin: 10.1 g/dL — AB (ref 13.5–17.5)
Platelets: 233 10*3/uL (ref 150–399)
WBC: 3.7 10^3/mL

## 2014-08-21 NOTE — Telephone Encounter (Signed)
Megan from [facility] called and would like to speak to one if the nurses for Dr. Raoul Pitch. She is not sure if the pic line is infected or what might be going on. Please call her at [phone] jw

## 2014-08-21 NOTE — Telephone Encounter (Signed)
Contacted Megan and she stated that his orthostatics sitting were in the 130's and standing were in the 80's.  Instructed her that if she feels like the pic line is infected that he should just go to the ER. Jinny Blossom stated she would contact the daughter and let her know. Katharina Caper, Rayanna Matusik D, [place]

## 2014-08-24 ENCOUNTER — Telehealth: Payer: Self-pay | Admitting: Family Medicine

## 2014-08-24 ENCOUNTER — Encounter: Payer: Self-pay | Admitting: Family Medicine

## 2014-08-24 LAB — CHLORIDE: Chloride: 103 mmol/L

## 2014-08-24 LAB — CALCIUM: Calcium: 8.9 mg/dL

## 2014-08-24 LAB — CO2, TOTAL: CO2, blood: 25

## 2014-08-24 LAB — PROTEIN, TOTAL: Total Protein: 7.3 g/dL

## 2014-08-24 LAB — ALBUMIN: Albumin: 3.3

## 2014-08-24 NOTE — Telephone Encounter (Signed)
Please call patients daughter, the blood work we collected in the home last week is normal. All of his electrolytes have become normal. His hemoglobin (RBC count) has also increased, but he is still anemic. I will send a copy for his records. Thanks.

## 2014-08-24 NOTE — Telephone Encounter (Signed)
Has questions about orders for patients fluid intake, would like to speak with RN

## 2014-08-24 NOTE — Telephone Encounter (Signed)
pts daughter informed. [name], [name]

## 2014-08-24 NOTE — Telephone Encounter (Signed)
LMOVM for wendy to return call .Tamya Denardo, Salome Spotted

## 2014-08-25 DIAGNOSIS — E43 Unspecified severe protein-calorie malnutrition: Secondary | ICD-10-CM | POA: Diagnosis not present

## 2014-08-25 DIAGNOSIS — R1310 Dysphagia, unspecified: Secondary | ICD-10-CM | POA: Diagnosis not present

## 2014-08-25 DIAGNOSIS — C140 Malignant neoplasm of pharynx, unspecified: Secondary | ICD-10-CM | POA: Diagnosis not present

## 2014-08-25 DIAGNOSIS — C321 Malignant neoplasm of supraglottis: Secondary | ICD-10-CM | POA: Diagnosis not present

## 2014-08-25 DIAGNOSIS — I5022 Chronic systolic (congestive) heart failure: Secondary | ICD-10-CM | POA: Diagnosis not present

## 2014-08-25 DIAGNOSIS — K861 Other chronic pancreatitis: Secondary | ICD-10-CM | POA: Diagnosis not present

## 2014-08-26 ENCOUNTER — Telehealth: Payer: Self-pay | Admitting: Family Medicine

## 2014-08-26 DIAGNOSIS — C321 Malignant neoplasm of supraglottis: Secondary | ICD-10-CM | POA: Diagnosis not present

## 2014-08-26 DIAGNOSIS — E43 Unspecified severe protein-calorie malnutrition: Secondary | ICD-10-CM | POA: Diagnosis not present

## 2014-08-26 DIAGNOSIS — C140 Malignant neoplasm of pharynx, unspecified: Secondary | ICD-10-CM | POA: Diagnosis not present

## 2014-08-26 DIAGNOSIS — I5022 Chronic systolic (congestive) heart failure: Secondary | ICD-10-CM | POA: Diagnosis not present

## 2014-08-26 DIAGNOSIS — K861 Other chronic pancreatitis: Secondary | ICD-10-CM | POA: Diagnosis not present

## 2014-08-26 DIAGNOSIS — R1310 Dysphagia, unspecified: Secondary | ICD-10-CM | POA: Diagnosis not present

## 2014-08-26 NOTE — Telephone Encounter (Signed)
Robin from [facility] called and would like to get some direction on BP's for the patient when sitting his BP is around 112/80 but when he stand sit goes down to 86/62. She would just like some guidelines on what some good ranges for standing or sitting would be. Please call Robin at [phone]. jw

## 2014-08-27 ENCOUNTER — Other Ambulatory Visit: Payer: Self-pay | Admitting: Family Medicine

## 2014-08-27 ENCOUNTER — Telehealth: Payer: Self-pay | Admitting: Family Medicine

## 2014-08-27 DIAGNOSIS — R1310 Dysphagia, unspecified: Secondary | ICD-10-CM | POA: Diagnosis not present

## 2014-08-27 DIAGNOSIS — I5022 Chronic systolic (congestive) heart failure: Secondary | ICD-10-CM | POA: Diagnosis not present

## 2014-08-27 DIAGNOSIS — K861 Other chronic pancreatitis: Secondary | ICD-10-CM | POA: Diagnosis not present

## 2014-08-27 DIAGNOSIS — E43 Unspecified severe protein-calorie malnutrition: Secondary | ICD-10-CM | POA: Diagnosis not present

## 2014-08-27 DIAGNOSIS — C140 Malignant neoplasm of pharynx, unspecified: Secondary | ICD-10-CM | POA: Diagnosis not present

## 2014-08-27 DIAGNOSIS — C321 Malignant neoplasm of supraglottis: Secondary | ICD-10-CM | POA: Diagnosis not present

## 2014-08-27 NOTE — Telephone Encounter (Signed)
Spoke with patient's daughter informed her of message below from Dr. Raoul Pitch.  Appt Tuesday at 3:15 09/01/14. Derl Barrow, RN

## 2014-08-27 NOTE — Telephone Encounter (Signed)
Please call patient's daughter [name],  I see that he does have his endocrinology appointment set up for the end of July. Since this is so far off, I would like to follow-up with him if possible before July 1. I have a few openings Monday and Tuesday of next week, if she can bring him in one of those days, so we can touch base for the last time I would appreciate it. The earlier in the week next week to better, this way if we make any changes or order labs I can follow-up with him before the 30th.   Please inform the nurse that has called about blood pressure ranges (see prior phone note) that we are following up with him hopefully next week, and to continue doing what she is doing. We may make some medication changes when he comes in next week, until then his blood pressures that she is reporting is fine and he needs to stand up slowly allow his blood pressure to acclimate. Thank you.

## 2014-08-31 DIAGNOSIS — K861 Other chronic pancreatitis: Secondary | ICD-10-CM | POA: Diagnosis not present

## 2014-08-31 DIAGNOSIS — C321 Malignant neoplasm of supraglottis: Secondary | ICD-10-CM | POA: Diagnosis not present

## 2014-08-31 DIAGNOSIS — I5022 Chronic systolic (congestive) heart failure: Secondary | ICD-10-CM | POA: Diagnosis not present

## 2014-08-31 DIAGNOSIS — E43 Unspecified severe protein-calorie malnutrition: Secondary | ICD-10-CM | POA: Diagnosis not present

## 2014-08-31 DIAGNOSIS — C140 Malignant neoplasm of pharynx, unspecified: Secondary | ICD-10-CM | POA: Diagnosis not present

## 2014-08-31 DIAGNOSIS — R1310 Dysphagia, unspecified: Secondary | ICD-10-CM | POA: Diagnosis not present

## 2014-09-01 ENCOUNTER — Telehealth: Payer: Self-pay | Admitting: Family Medicine

## 2014-09-01 ENCOUNTER — Ambulatory Visit: Payer: Medicare Other | Admitting: Family Medicine

## 2014-09-01 DIAGNOSIS — R1310 Dysphagia, unspecified: Secondary | ICD-10-CM | POA: Diagnosis not present

## 2014-09-01 DIAGNOSIS — E43 Unspecified severe protein-calorie malnutrition: Secondary | ICD-10-CM | POA: Diagnosis not present

## 2014-09-01 DIAGNOSIS — C140 Malignant neoplasm of pharynx, unspecified: Secondary | ICD-10-CM | POA: Diagnosis not present

## 2014-09-01 DIAGNOSIS — K861 Other chronic pancreatitis: Secondary | ICD-10-CM | POA: Diagnosis not present

## 2014-09-01 DIAGNOSIS — I5022 Chronic systolic (congestive) heart failure: Secondary | ICD-10-CM | POA: Diagnosis not present

## 2014-09-01 DIAGNOSIS — C321 Malignant neoplasm of supraglottis: Secondary | ICD-10-CM | POA: Diagnosis not present

## 2014-09-01 NOTE — Telephone Encounter (Signed)
Megan from [facility] called and would like to let the doctor know about the patients BP today. Sitting 144/80 and standing 114/82. Jinny Blossom would also like the daughter or herself to know what is a good BP sitting and standing or when the should be concerned. jw

## 2014-09-02 DIAGNOSIS — K861 Other chronic pancreatitis: Secondary | ICD-10-CM | POA: Diagnosis not present

## 2014-09-02 DIAGNOSIS — C140 Malignant neoplasm of pharynx, unspecified: Secondary | ICD-10-CM | POA: Diagnosis not present

## 2014-09-02 DIAGNOSIS — R1310 Dysphagia, unspecified: Secondary | ICD-10-CM | POA: Diagnosis not present

## 2014-09-02 DIAGNOSIS — I5022 Chronic systolic (congestive) heart failure: Secondary | ICD-10-CM | POA: Diagnosis not present

## 2014-09-02 DIAGNOSIS — C321 Malignant neoplasm of supraglottis: Secondary | ICD-10-CM | POA: Diagnosis not present

## 2014-09-02 DIAGNOSIS — E43 Unspecified severe protein-calorie malnutrition: Secondary | ICD-10-CM | POA: Diagnosis not present

## 2014-09-03 ENCOUNTER — Ambulatory Visit: Payer: Medicare Other | Admitting: Family Medicine

## 2014-09-03 ENCOUNTER — Telehealth: Payer: Self-pay | Admitting: Family Medicine

## 2014-09-03 DIAGNOSIS — R1310 Dysphagia, unspecified: Secondary | ICD-10-CM | POA: Diagnosis not present

## 2014-09-03 DIAGNOSIS — C140 Malignant neoplasm of pharynx, unspecified: Secondary | ICD-10-CM | POA: Diagnosis not present

## 2014-09-03 DIAGNOSIS — C321 Malignant neoplasm of supraglottis: Secondary | ICD-10-CM | POA: Diagnosis not present

## 2014-09-03 DIAGNOSIS — I5022 Chronic systolic (congestive) heart failure: Secondary | ICD-10-CM | POA: Diagnosis not present

## 2014-09-03 DIAGNOSIS — K861 Other chronic pancreatitis: Secondary | ICD-10-CM | POA: Diagnosis not present

## 2014-09-03 DIAGNOSIS — E43 Unspecified severe protein-calorie malnutrition: Secondary | ICD-10-CM | POA: Diagnosis not present

## 2014-09-03 NOTE — Telephone Encounter (Signed)
Please call pt nurse. She has asked what a normal BP range is for [name]. He is Difficult patient, with many comorbidities and there is likely no perfect answer to this question. We would like to see his blood pressure stays below 140/90, and theoretically above 100/50. However there will be times that her lower, such as upon standing if his fluids are not adequately or he is experiencing orthostatic hypotension. The main concern would be is if he becomes symptomatic when his blood pressure is lower.  - Family was asked to schedule an appointment this past week, but they did miss the appointment. I would encourage them to follow-up with her new primary care provider within the next 2 weeks.

## 2014-09-03 NOTE — Telephone Encounter (Signed)
Megan from [facility] calls to report BPs for patient from this morning's home visit.   Sitting BP 122/78 Standing BP 94/68  Any questions please call Megan at [phone].

## 2014-09-04 ENCOUNTER — Other Ambulatory Visit: Payer: Self-pay | Admitting: Family Medicine

## 2014-09-04 DIAGNOSIS — I5022 Chronic systolic (congestive) heart failure: Secondary | ICD-10-CM | POA: Diagnosis not present

## 2014-09-04 DIAGNOSIS — K861 Other chronic pancreatitis: Secondary | ICD-10-CM | POA: Diagnosis not present

## 2014-09-04 DIAGNOSIS — C140 Malignant neoplasm of pharynx, unspecified: Secondary | ICD-10-CM | POA: Diagnosis not present

## 2014-09-04 DIAGNOSIS — R1310 Dysphagia, unspecified: Secondary | ICD-10-CM | POA: Diagnosis not present

## 2014-09-04 DIAGNOSIS — C321 Malignant neoplasm of supraglottis: Secondary | ICD-10-CM | POA: Diagnosis not present

## 2014-09-04 DIAGNOSIS — E43 Unspecified severe protein-calorie malnutrition: Secondary | ICD-10-CM | POA: Diagnosis not present

## 2014-09-04 NOTE — Telephone Encounter (Signed)
Left voice message for Jinny Blossom, RN [facility] to return nurse call regarding pt's blood pressures.  Derl Barrow, RN

## 2014-09-08 DIAGNOSIS — K861 Other chronic pancreatitis: Secondary | ICD-10-CM | POA: Diagnosis not present

## 2014-09-08 DIAGNOSIS — C321 Malignant neoplasm of supraglottis: Secondary | ICD-10-CM | POA: Diagnosis not present

## 2014-09-08 DIAGNOSIS — R1310 Dysphagia, unspecified: Secondary | ICD-10-CM | POA: Diagnosis not present

## 2014-09-08 DIAGNOSIS — I5022 Chronic systolic (congestive) heart failure: Secondary | ICD-10-CM | POA: Diagnosis not present

## 2014-09-08 DIAGNOSIS — C140 Malignant neoplasm of pharynx, unspecified: Secondary | ICD-10-CM | POA: Diagnosis not present

## 2014-09-08 DIAGNOSIS — E43 Unspecified severe protein-calorie malnutrition: Secondary | ICD-10-CM | POA: Diagnosis not present

## 2014-09-09 NOTE — Telephone Encounter (Signed)
Left voice message for [name] to return nurse call if she still wanted to know about patient's blood pressures.  Derl Barrow, RN

## 2014-09-10 ENCOUNTER — Telehealth: Payer: Self-pay | Admitting: *Deleted

## 2014-09-10 DIAGNOSIS — R1310 Dysphagia, unspecified: Secondary | ICD-10-CM | POA: Diagnosis not present

## 2014-09-10 DIAGNOSIS — C321 Malignant neoplasm of supraglottis: Secondary | ICD-10-CM | POA: Diagnosis not present

## 2014-09-10 DIAGNOSIS — E43 Unspecified severe protein-calorie malnutrition: Secondary | ICD-10-CM | POA: Diagnosis not present

## 2014-09-10 DIAGNOSIS — C140 Malignant neoplasm of pharynx, unspecified: Secondary | ICD-10-CM | POA: Diagnosis not present

## 2014-09-10 DIAGNOSIS — I5022 Chronic systolic (congestive) heart failure: Secondary | ICD-10-CM | POA: Diagnosis not present

## 2014-09-10 DIAGNOSIS — C051 Malignant neoplasm of soft palate: Secondary | ICD-10-CM | POA: Diagnosis not present

## 2014-09-10 DIAGNOSIS — K861 Other chronic pancreatitis: Secondary | ICD-10-CM | POA: Diagnosis not present

## 2014-09-10 DIAGNOSIS — K1233 Oral mucositis (ulcerative) due to radiation: Secondary | ICD-10-CM | POA: Diagnosis not present

## 2014-09-10 NOTE — Telephone Encounter (Signed)
Colletta Maryland,  RN with [facility] called to report orthostatic blood pressures.  Yesterday 09/09/14 sitting 118/84 and standing 98/50.  Today 09/10/14 sitting 98/70 and standing 78/50.  Pt only complaint is dizziness.  Please give her a call at [phone].  Derl Barrow, RN

## 2014-09-11 DIAGNOSIS — C321 Malignant neoplasm of supraglottis: Secondary | ICD-10-CM | POA: Diagnosis not present

## 2014-09-11 DIAGNOSIS — E43 Unspecified severe protein-calorie malnutrition: Secondary | ICD-10-CM | POA: Diagnosis not present

## 2014-09-11 DIAGNOSIS — K861 Other chronic pancreatitis: Secondary | ICD-10-CM | POA: Diagnosis not present

## 2014-09-11 DIAGNOSIS — R1310 Dysphagia, unspecified: Secondary | ICD-10-CM | POA: Diagnosis not present

## 2014-09-11 DIAGNOSIS — C140 Malignant neoplasm of pharynx, unspecified: Secondary | ICD-10-CM | POA: Diagnosis not present

## 2014-09-11 DIAGNOSIS — I5022 Chronic systolic (congestive) heart failure: Secondary | ICD-10-CM | POA: Diagnosis not present

## 2014-09-11 NOTE — Telephone Encounter (Signed)
Returned call, [facility] nurse had no further questions regarding blood pressure but told him to get an appointment to be seen.   Also called patient and spoke with daughter; she had just measured blood pressure and it was 124/86 HR 88. He is not dizzy currently, has been drinking some more water. Taking 2.5mg  bisoprolol (1/2 tab 5mg ). I asked her to hold the bisoprolol until his appt which I made for 7/20 in the AM and check BP daily and bring this log to the appt.

## 2014-09-14 DIAGNOSIS — R1310 Dysphagia, unspecified: Secondary | ICD-10-CM | POA: Diagnosis not present

## 2014-09-14 DIAGNOSIS — E43 Unspecified severe protein-calorie malnutrition: Secondary | ICD-10-CM | POA: Diagnosis not present

## 2014-09-14 DIAGNOSIS — C140 Malignant neoplasm of pharynx, unspecified: Secondary | ICD-10-CM | POA: Diagnosis not present

## 2014-09-14 DIAGNOSIS — C321 Malignant neoplasm of supraglottis: Secondary | ICD-10-CM | POA: Diagnosis not present

## 2014-09-14 DIAGNOSIS — I5022 Chronic systolic (congestive) heart failure: Secondary | ICD-10-CM | POA: Diagnosis not present

## 2014-09-14 DIAGNOSIS — K861 Other chronic pancreatitis: Secondary | ICD-10-CM | POA: Diagnosis not present

## 2014-09-16 DIAGNOSIS — K861 Other chronic pancreatitis: Secondary | ICD-10-CM | POA: Diagnosis not present

## 2014-09-16 DIAGNOSIS — R1310 Dysphagia, unspecified: Secondary | ICD-10-CM | POA: Diagnosis not present

## 2014-09-16 DIAGNOSIS — C321 Malignant neoplasm of supraglottis: Secondary | ICD-10-CM | POA: Diagnosis not present

## 2014-09-16 DIAGNOSIS — C140 Malignant neoplasm of pharynx, unspecified: Secondary | ICD-10-CM | POA: Diagnosis not present

## 2014-09-16 DIAGNOSIS — E43 Unspecified severe protein-calorie malnutrition: Secondary | ICD-10-CM | POA: Diagnosis not present

## 2014-09-16 DIAGNOSIS — I5022 Chronic systolic (congestive) heart failure: Secondary | ICD-10-CM | POA: Diagnosis not present

## 2014-09-17 ENCOUNTER — Telehealth: Payer: Self-pay | Admitting: *Deleted

## 2014-09-17 DIAGNOSIS — C321 Malignant neoplasm of supraglottis: Secondary | ICD-10-CM | POA: Diagnosis not present

## 2014-09-17 DIAGNOSIS — R1310 Dysphagia, unspecified: Secondary | ICD-10-CM | POA: Diagnosis not present

## 2014-09-17 DIAGNOSIS — K861 Other chronic pancreatitis: Secondary | ICD-10-CM | POA: Diagnosis not present

## 2014-09-17 DIAGNOSIS — C140 Malignant neoplasm of pharynx, unspecified: Secondary | ICD-10-CM | POA: Diagnosis not present

## 2014-09-17 DIAGNOSIS — E43 Unspecified severe protein-calorie malnutrition: Secondary | ICD-10-CM | POA: Diagnosis not present

## 2014-09-17 DIAGNOSIS — I5022 Chronic systolic (congestive) heart failure: Secondary | ICD-10-CM | POA: Diagnosis not present

## 2014-09-17 NOTE — Telephone Encounter (Signed)
Jinny Blossom, RN with [facility] completed home visit 09/16/14.  Pt's blood pressure sitting was 98/68 and 80/62 standing. Derl Barrow, RN

## 2014-09-23 ENCOUNTER — Encounter: Payer: Self-pay | Admitting: Family Medicine

## 2014-09-23 ENCOUNTER — Ambulatory Visit (INDEPENDENT_AMBULATORY_CARE_PROVIDER_SITE_OTHER): Payer: Medicare Other | Admitting: Family Medicine

## 2014-09-23 VITALS — BP 104/79 | HR 126 | Temp 98.3°F | Ht 71.0 in | Wt 122.3 lb

## 2014-09-23 DIAGNOSIS — I951 Orthostatic hypotension: Secondary | ICD-10-CM

## 2014-09-23 DIAGNOSIS — E039 Hypothyroidism, unspecified: Secondary | ICD-10-CM

## 2014-09-23 DIAGNOSIS — R630 Anorexia: Secondary | ICD-10-CM | POA: Diagnosis not present

## 2014-09-23 MED ORDER — MIDODRINE HCL 10 MG PO TABS: 10.0000 mg | ORAL_TABLET | Freq: Three times a day (TID) | ORAL | Status: DC

## 2014-09-23 NOTE — Assessment & Plan Note (Addendum)
Still with persistent symptoms off bisoprolol. Pt used to have an issue with uncontrolled hypertension. Has endocrinology appt next week. Reporting falls almost daily. Would prefer to not make major changes today but with report of falls will add midodrine 10mg  TID while upright until endo appt. Instructed pt to call if BP significantly elevated with this.   Would prefer to continue the beta blocker for his CHF, but his BP has improved off of this and he has not had any worsening of symptoms, so until his OH is improved and he is no longer falling we will hold off on this.

## 2014-09-23 NOTE — Assessment & Plan Note (Addendum)
On 170mcg daily, last TSH in March was low 0.078. Check TSH today. Has endo appt next week to establish care.

## 2014-09-23 NOTE — Progress Notes (Signed)
   Subjective:    [name], male    DOB: Nov 13, 1950, 64 y.o.   MRN: [id number]  HPI  CC: dizzy/low blood pressure  # Orthostatic hypotension:  Has had persistent symptoms for past 2 months.   Has home health coming in and [name] is also checking blood pressures  Flomax stopped (sister says this was started for concern of bladder obstruction in the hospital but he never had any issues with voiding before this and has not had any issues since stopping)  Bisoprolol was decreased to 5mg , then 2.5mg , and stopped 2 weeks ago. With each decrease and stopping he has had some improvement with the standing BPs  Dizzy symptoms are daily. He has fallen about 5 times a week, often has a caregiver around him and they are able to identify when he is about to fall but he still gets up sometimes and falls quickly. He does not fall every time he stands ROS: no leg swelling, no chest pains, no shortness of breath  # Appetite  Still has no appetite. Continues to use PEG tube.  Had been on remeron 2-3 years ago and he had a fall with this. Megace increased his appetite well but apparently caused a lot of his current issues with low BP?  Stated on Marinol (by [facility]) last month but hasn't seen much improvement  Review of Systems   See HPI for ROS.   Past medical history, surgical, family, and social history reviewed and updated in the EMR as appropriate. Objective:  BP 104/79 mmHg  Pulse 126  Temp(Src) 98.3 F (36.8 C) (Oral)  Ht 5\' 11"  (1.803 m)  Wt 122 lb 5 oz (55.481 kg)  BMI 17.07 kg/m2 Vitals and nursing note reviewed  General: NAD, lays down on exam table when not in the room but able to sit up on his own CV: tachycardic, regular rhythm, normal heart sounds, no murmur appreciated.  Resp: clear bilaterally, no crackles, normal effort Ext: no lower leg edema.  Assessment & Plan:  See Problem List Documentation

## 2014-09-23 NOTE — Patient Instructions (Signed)
Make sure you make it to the endocrinologist appointment next week.  We will add a medicine called Midodrine: take 3 times a day while upright (ie if he will be laying down for most of the day you can skip a dose)  Make sure you call the office if his blood pressure increases a lot (above 150/90) while on the midodrine.  We checked your thyroid level (TSH) today, we will call you with the result so you can bring it to your endocrinologist appt.

## 2014-09-23 NOTE — Assessment & Plan Note (Signed)
Not better while on Marinol rx by [facility]. Would avoid remeron and megace as they caused him issues in the past. Has f/u with [facility] next month. Continue tube feeds for now, will not make any additional changes.

## 2014-09-24 ENCOUNTER — Telehealth: Payer: Self-pay | Admitting: Family Medicine

## 2014-09-24 DIAGNOSIS — K861 Other chronic pancreatitis: Secondary | ICD-10-CM | POA: Diagnosis not present

## 2014-09-24 DIAGNOSIS — I5022 Chronic systolic (congestive) heart failure: Secondary | ICD-10-CM | POA: Diagnosis not present

## 2014-09-24 DIAGNOSIS — R1310 Dysphagia, unspecified: Secondary | ICD-10-CM | POA: Diagnosis not present

## 2014-09-24 DIAGNOSIS — E43 Unspecified severe protein-calorie malnutrition: Secondary | ICD-10-CM | POA: Diagnosis not present

## 2014-09-24 DIAGNOSIS — C140 Malignant neoplasm of pharynx, unspecified: Secondary | ICD-10-CM | POA: Diagnosis not present

## 2014-09-24 DIAGNOSIS — C321 Malignant neoplasm of supraglottis: Secondary | ICD-10-CM | POA: Diagnosis not present

## 2014-09-24 LAB — TSH: TSH: <0.008 u[IU]/mL — ABNORMAL LOW (ref 0.350–4.500)

## 2014-09-24 MED ORDER — LEVOTHYROXINE SODIUM 112 MCG PO TABS: 112.0000 ug | ORAL_TABLET | Freq: Every day | ORAL | Status: DC

## 2014-09-24 MED ORDER — LEVOTHYROXINE SODIUM 50 MCG PO TABS: 50.0000 ug | ORAL_TABLET | Freq: Every day | ORAL | Status: DC

## 2014-09-24 NOTE — Telephone Encounter (Signed)
Spoke with patient's sister and let her know about TSH level (had her write this down to bring to endocrinology appt). Sent in new rx for 141mcg synthroid. -Dr. Lamar Benes

## 2014-09-30 ENCOUNTER — Encounter: Payer: Self-pay | Admitting: Endocrinology

## 2014-09-30 ENCOUNTER — Ambulatory Visit (INDEPENDENT_AMBULATORY_CARE_PROVIDER_SITE_OTHER): Payer: Medicare Other | Admitting: Endocrinology

## 2014-09-30 VITALS — BP 88/66 | HR 111 | Temp 98.0°F | Resp 14 | Ht 71.0 in | Wt 121.8 lb

## 2014-09-30 DIAGNOSIS — E2740 Unspecified adrenocortical insufficiency: Secondary | ICD-10-CM | POA: Diagnosis not present

## 2014-09-30 DIAGNOSIS — E039 Hypothyroidism, unspecified: Secondary | ICD-10-CM

## 2014-09-30 DIAGNOSIS — E0580 Other thyrotoxicosis without thyrotoxic crisis or storm: Secondary | ICD-10-CM | POA: Diagnosis not present

## 2014-09-30 LAB — BASIC METABOLIC PANEL
BUN: 18 mg/dL (ref 6–23)
CO2: 27 mEq/L (ref 19–32)
Calcium: 9.7 mg/dL (ref 8.4–10.5)
Chloride: 103 mEq/L (ref 96–112)
Creatinine, Ser: 0.74 mg/dL (ref 0.40–1.50)
GFR: 137.8 mL/min (ref >60.00)
GLUCOSE: 92 mg/dL (ref 70–99)
POTASSIUM: 4.4 meq/L (ref 3.5–5.1)
Sodium: 137 mEq/L (ref 135–145)

## 2014-09-30 LAB — T4, FREE: Free T4: 1.95 ng/dL — ABNORMAL HIGH (ref 0.60–1.60)

## 2014-09-30 MED ORDER — HYDROCORTISONE 20 MG PO TABS: ORAL_TABLET | ORAL | Status: DC

## 2014-09-30 MED ORDER — FLUDROCORTISONE ACETATE 0.1 MG PO TABS: 0.1000 mg | ORAL_TABLET | Freq: Every day | ORAL | Status: DC

## 2014-09-30 NOTE — Patient Instructions (Addendum)
Synthroid 112ug daily  Cut Midodrine 1/2 twice daily  Fludrocortisone 1 daily  Cortisone 20 twice daily  Call if feet swell

## 2014-09-30 NOTE — Progress Notes (Signed)
[name], male   DOB: 1950-07-06, 64 y.o.   MRN: [id number]            Reason for Appointment:  Hypothyroidism and adrenal insufficiency, new visit    History of Present Illness:   PROBLEM 1: Hypothyroidism was first diagnosed in 2012   The symptoms consistent with hypothyroidism  have been: fatigue, feeling sleepy but no known history of cold sensitivity, difficulty concentrating His medications have been adjusted periodically but TSH has never been normal since 2012 Not clear the reason why his TSH was significantly high in 2013 and also 2015; patient is not a good historian and difficult to communicate with him today His prescription history indicates that he has been taking anywhere between 150 up to 250 g of levothyroxine since about 2013  Earlier this year his TSH levels have been in the hyperthyroid range and his dose of levothyroxine has been reduced from previous levels of 200 g down to 175 and more recently he is taking a combination of 112 and 50 g for the last week  The patient does complain of feeling palpitations and at times will get shaky.  Does not complain of feeling excessively hot or sweaty Although this year has been losing weight he recently has started gaining back some weight with improved nutrition  TSH levels have been as follows:  Lab Results  Component Value Date   TSH <0.008* 09/23/2014   TSH 0.078* 05/29/2014   TSH <0.010* 04/10/2014   TSH 99.937* 05/09/2013   TSH 15.352* 12/04/2012   TSH 84.299* 12/24/2011   TSH 16.327* 01/05/2011   TSH 18.958* 12/12/2010   TSH 27.001* 11/15/2010   TSH 18.570* 09/26/2010    More recent thyroid levels:         Lab Results  Component Value Date   TSH <0.008* 09/23/2014   TSH 0.078* 05/29/2014   TSH <0.010* 04/10/2014   FREET4 0.89 05/09/2013   FREET4 0.62* 12/26/2011   FREET4 0.50* 11/15/2010     PROBLEM 2: ADRENAL insufficiency:  Patient was evaluated with a cortisol level in his  admission to the hospital for seizure and fever in 3/15 and was low but no treatment was instituted His blood pressure apparently started getting lower in April and his antihypertensive medications were progressively tapered off  Cortisol level was again low during his hospitalization in 5/16 He did not have any treatment instituted at this time Subsequently he continued to have problems with decreased appetite, low blood pressure and weight loss  Because of persistently low cortisol levels his PCP started him on hydrocortisone 15 mg a day in early June 2016 He has not had a Cortrosyn stimulation test done  He does not think he felt any better with starting the cortisone dose Subsequently continued to have problems with low blood pressure on standing up and feeling dizzy; also was having frequent near syncopal episodes and falling because of low blood pressure Apparently his blood pressure was in the 50K and 93G systolic at times  Because of continued low blood pressure his PCP started him on midodrine about a week ago and he thinks he may be less lightheaded with this and has not had any falls Currently taking 20 mg hydrocortisone in divided doses     Past Medical History  Diagnosis Date  . Hypertension   . Cirrhosis of liver   . Hypothyroidism   . Alcohol abuse   . CHF (congestive heart failure)     EF  45-50% 05/2012  . Hyperlipidemia   . Arthritis   . Laryngeal cancer 2011    Laryngectomy, T3N0M0  . Seizures     Past Surgical History  Procedure Laterality Date  . Tracheal surgery    . Tracheostomy    . Left and right heart catheterization with coronary angiogram N/A 12/28/2011    Procedure: LEFT AND RIGHT HEART CATHETERIZATION WITH CORONARY ANGIOGRAM;  Surgeon: Jolaine Artist, MD;  Location: [facility] CATH LAB;  Service: Cardiovascular;  Laterality: N/A;  . Esophagogastroduodenoscopy N/A 04/10/2014    Procedure: ESOPHAGOGASTRODUODENOSCOPY (EGD);  Surgeon: Missy Sabins, MD;   Location: [facility] ENDOSCOPY;  Service: Endoscopy;  Laterality: N/A;    Family History  Problem Relation Age of Onset  . Hyperlipidemia Mother   . Hypertension Mother   . Hypertension Father   . Hyperlipidemia Father     Social History:  reports that he has quit smoking. His smoking use included Cigarettes. He has a 10 pack-year smoking history. He has never used smokeless tobacco. He reports that he drinks alcohol. He reports that he does not use illicit drugs.  Allergies:  Allergies  Allergen Reactions  . Vicodin [Hydrocodone-Acetaminophen] Rash  . Other     Unknown anesthesia medicine caused cardiac arrest.  . Tylenol [Acetaminophen]     HBP -Liver Cirrhosis      Medication List       This list is accurate as of: 09/30/14 12:48 PM.  Always use your most recent med list.               aspirin EC 81 MG tablet  Take 81 mg by mouth daily.     bisoprolol 5 MG tablet  Commonly known as:  ZEBETA  TAKE 1 TABLET BY MOUTH EVERY DAY     dronabinol 10 MG capsule  Commonly known as:  MARINOL  Take 10 mg by mouth 2 (two) times daily before a meal.     feeding supplement (JEVITY 1.5 CAL) Liqd  Place 70 mL/hr into feeding tube continuous.     fentaNYL 25 MCG/HR patch  Commonly known as:  DURAGESIC - dosed mcg/hr  Place 1 patch (25 mcg total) onto the skin every 3 (three) days.     fluconazole 200 MG tablet  Commonly known as:  DIFLUCAN  Take 200 mg by mouth daily.     fludrocortisone 0.1 MG tablet  Commonly known as:  FLORINEF  Take 1 tablet (0.1 mg total) by mouth daily.     free water Soln  Place 175 mLs into feeding tube 4 (four) times daily.     guaiFENesin 600 MG 12 hr tablet  Commonly known as:  MUCINEX  Take 1 tablet (600 mg total) by mouth 2 (two) times daily.     hydrocortisone 20 MG tablet  Commonly known as:  CORTEF  Take 1 tab bid     levothyroxine 112 MCG tablet  Commonly known as:  SYNTHROID, LEVOTHROID  Take 1 tablet (112 mcg total) by mouth daily  before breakfast. Take with 42mcg tablet.     levothyroxine 50 MCG tablet  Commonly known as:  SYNTHROID, LEVOTHROID  Take 1 tablet (50 mcg total) by mouth daily before breakfast. Take with 112mcg tablet.     lidocaine 2 % solution  Commonly known as:  XYLOCAINE  Use as directed 10 mLs in the mouth or throat 4 (four) times daily as needed for mouth pain.     magic mouthwash w/lidocaine Soln  Take 5 mLs  by mouth 4 (four) times daily.     midodrine 10 MG tablet  Commonly known as:  PROAMATINE  Take 1 tablet (10 mg total) by mouth 3 (three) times daily. While upright.     nystatin 100000 UNIT/ML suspension  Commonly known as:  MYCOSTATIN     ondansetron 4 MG tablet  Commonly known as:  ZOFRAN  Take 1 tablet (4 mg total) by mouth every 8 (eight) hours as needed for nausea or vomiting.     oxyCODONE 5 MG/5ML solution  Commonly known as:  ROXICODONE  Take 5 mLs (5 mg total) by mouth every 4 (four) hours as needed for severe pain.        Review of Systems  Constitutional: Positive for weight loss.  Eyes: Negative for blurred vision.  Respiratory: Negative for shortness of breath.   Cardiovascular: Positive for palpitations.  Gastrointestinal: Negative for diarrhea and constipation.  Genitourinary: Negative for frequency.  Musculoskeletal: Positive for falls. Negative for joint pain.  Skin: Negative for rash.  Neurological: Positive for dizziness and weakness. Negative for focal weakness and headaches.  Endo/Heme/Allergies: Negative for polydipsia.  Psychiatric/Behavioral: The patient is not nervous/anxious.    He still having problems with decreased appetite, slightly better now.  Previously had been treated with Megace Currently because of difficulty eating and swallowing is getting tube feeding for nutrition  HYPERTENSION: He had previously significant hypertension treated with multiple drugs, most recent medication was bisoprolol which was stopped when blood pressure was  low  No history of diabetes            Examination:    BP 88/66 mmHg  Pulse 111  Temp(Src) 98 F (36.7 C)  Resp 14  Ht 5\' 11"  (1.803 m)  Wt 121 lb 12.8 oz (55.248 kg)  BMI 17.00 kg/m2  SpO2 95%  GENERAL:  Asthenic build.  He is aphonic because of his tracheostomy  No pallor, clubbing, lymphadenopathy or edema.   Skin:  no rash; has mild increased pigmentation on the dorsum of his fingers and elbows.  EYES:  No prominence of the eyes or swelling of the eyelids  ENT: Oral mucosa shows some whitish discolorations on the lesion on his left  palate area posteriorly Has scattered increased pigmentation on the inner lip and tongue as well as inner cheek area  THYROID:  Not palpable.  Most of the soft tissue in the neck is removed surgically  HEART:  Normal to slightly loud S1 and S2; no murmur or click.  No S3 or S4  CHEST:    Lungs: Vescicular breath sounds heard equally.  No crepitations/ wheeze.  ABDOMEN:  No distention.  Liver and spleen not palpable.  No other mass or tenderness.  NEUROLOGICAL: Reflexes are difficult to elicit but appeared to be bilaterally brisk at biceps and brachioradialis.  JOINTS:  Normal.   Assessments   1. Hypothyroidism, primary and previously significant with TSH level as high as 99 and historically requiring relatively large doses of levothyroxine supplements. His follow-up for hypothyroidism has been very irregular and not clear why his TSH levels have fluctuated in the past. Most likely because of his significant weight loss he is requiring much lower doses of levothyroxine now and has iatrogenic hyperthyroidism with signs of tachycardia and hyperreflexia Currently unable to assess the degree of iatrogenic hyperthyroidism and no free T4 level is available recently  2.  Adrenal insufficiency: This is likely with his history of low blood pressure, history of weight loss and decreased  appetite as well as several low cortisol levels.  However  has not had Cortrosyn stimulation relation or ACTH levels have not been done to establish etiology of primary versus secondary adrenal insufficiency.  He has only mild pigmentary changes suggestive of primary adrenal insufficiency Has no evidence of adrenal or pituitary masses on his radiological studies recently  At this time the patient is on subtherapeutic doses of hydrocortisone of only 10 mg twice a day Also because of his iatrogenic hyperthyroidism he probably has increased metabolism of hydrocortisone and essentially is getting very little effective doses  He also appears to have significant mineralocorticoid deficiency because of his significant orthostatic hypotension and near syncopal episodes related to this.  He does need replacement of mineralocorticoid and this should be safe to do since his CHF has been inactive and his last ejection fraction has been normal  Treatment:    1. Increase hydrocortisone to 20 mg twice a day for now 2. Check ACTH level; if it is above normal will indicate primarily adrenal insufficiency 3. Start Florinef 0.1 mg daily and taper off midodrine.  Recheck electrolytes today.  Advised him on possible side effects of edema and he will call if he has any swelling of his feet or shortness of breath 4. Reduce Synthroid to 112 g and check free T4 level today.  Will reassess his dosage requirement based on this and future labs 5. Reassess clinically and with electrolytes in one week  Patient Instructions  Synthroid 112ug daily  Cut Midodrine 1/2 twice daily  Fludrocortisone 1 daily  Cortisone 20 twice daily  Call if feet swell      Shedric Fredericks 09/30/2014, 12:48 PM

## 2014-10-01 ENCOUNTER — Telehealth: Payer: Self-pay | Admitting: Family Medicine

## 2014-10-01 DIAGNOSIS — E43 Unspecified severe protein-calorie malnutrition: Secondary | ICD-10-CM | POA: Diagnosis not present

## 2014-10-01 DIAGNOSIS — I5022 Chronic systolic (congestive) heart failure: Secondary | ICD-10-CM | POA: Diagnosis not present

## 2014-10-01 DIAGNOSIS — C321 Malignant neoplasm of supraglottis: Secondary | ICD-10-CM | POA: Diagnosis not present

## 2014-10-01 DIAGNOSIS — K861 Other chronic pancreatitis: Secondary | ICD-10-CM | POA: Diagnosis not present

## 2014-10-01 DIAGNOSIS — C140 Malignant neoplasm of pharynx, unspecified: Secondary | ICD-10-CM | POA: Diagnosis not present

## 2014-10-01 DIAGNOSIS — R1310 Dysphagia, unspecified: Secondary | ICD-10-CM | POA: Diagnosis not present

## 2014-10-01 LAB — ACTH: ACTH: 2.9 pg/mL — ABNORMAL LOW (ref 7.2–63.3)

## 2014-10-01 NOTE — Telephone Encounter (Signed)
Megan from [facility] calls, requesting verbal orders for Home Health 2x a week for 2 weeks. Please Megan with verbals [phone].

## 2014-10-01 NOTE — Telephone Encounter (Signed)
Approved for continued home health services

## 2014-10-01 NOTE — Telephone Encounter (Signed)
Spoke with megan and gave the verbal ok. Jazmin Hartsell,CMA

## 2014-10-01 NOTE — Telephone Encounter (Signed)
Will forward to MD. Iniya Matzek,CMA  

## 2014-10-05 ENCOUNTER — Ambulatory Visit (INDEPENDENT_AMBULATORY_CARE_PROVIDER_SITE_OTHER): Payer: Medicare Other | Admitting: Endocrinology

## 2014-10-05 VITALS — BP 130/82 | HR 84 | Temp 98.6°F | Resp 17 | Ht 71.0 in | Wt 122.0 lb

## 2014-10-05 DIAGNOSIS — K861 Other chronic pancreatitis: Secondary | ICD-10-CM | POA: Diagnosis not present

## 2014-10-05 DIAGNOSIS — E039 Hypothyroidism, unspecified: Secondary | ICD-10-CM

## 2014-10-05 DIAGNOSIS — C321 Malignant neoplasm of supraglottis: Secondary | ICD-10-CM | POA: Diagnosis not present

## 2014-10-05 DIAGNOSIS — I5022 Chronic systolic (congestive) heart failure: Secondary | ICD-10-CM | POA: Diagnosis not present

## 2014-10-05 DIAGNOSIS — E43 Unspecified severe protein-calorie malnutrition: Secondary | ICD-10-CM | POA: Diagnosis not present

## 2014-10-05 DIAGNOSIS — E2740 Unspecified adrenocortical insufficiency: Secondary | ICD-10-CM | POA: Diagnosis not present

## 2014-10-05 DIAGNOSIS — R1310 Dysphagia, unspecified: Secondary | ICD-10-CM | POA: Diagnosis not present

## 2014-10-05 DIAGNOSIS — C140 Malignant neoplasm of pharynx, unspecified: Secondary | ICD-10-CM | POA: Diagnosis not present

## 2014-10-05 NOTE — Progress Notes (Signed)
[name], male   DOB: [birth year], 64 y.o.   MRN: [id number]            Reason for Appointment:  Hypothyroidism and adrenal insufficiency, follow-up visit    History of Present Illness:   PROBLEM 1: Hypothyroidism was first diagnosed in 2012   The symptoms consistent with hypothyroidism  had been: fatigue, feeling sleepy but no known history of cold sensitivity, difficulty concentrating His medications have been adjusted periodically but TSH has never been normal since 2012 Not clear the reason why his TSH was significantly high in 2013 and also 2015; patient is not a good historian and difficult to communicate with him today His prescription history indicates that he has been taking anywhere between 150 up to 250 g of levothyroxine since about 2013  Earlier this year his TSH levels have been in the hyperthyroid range and his dose of levothyroxine on his initial consultation was 162 g daily The patient did complain of feeling palpitations and at times will get shaky.  Since the patient's free T4 level was markedly increased at 1.95 in 09/2014 he was told to reduce the dose to only 112 g    Does not complain of feeling excessively hot or having palpitations and shakiness as much now His weight has leveled off   TSH levels have been as follows:  Lab Results  Component Value Date   TSH <0.008* 09/23/2014   TSH 0.078* 05/29/2014   TSH <0.010* 04/10/2014   TSH 99.937* 05/09/2013   TSH 15.352* 12/04/2012   TSH 84.299* 12/24/2011   TSH 16.327* 01/05/2011   TSH 18.958* 12/12/2010   TSH 27.001* 11/15/2010   TSH 18.570* 09/26/2010    More recent thyroid levels:         Lab Results  Component Value Date   TSH <0.008* 09/23/2014   TSH 0.078* 05/29/2014   TSH <0.010* 04/10/2014   FREET4 1.95* 09/30/2014   FREET4 0.89 05/09/2013   FREET4 0.62* 12/26/2011     PROBLEM 2: ADRENAL insufficiency:  Patient was evaluated with a cortisol level in his admission  to the hospital for seizure and fever in 3/15 and was low but no treatment was instituted His blood pressure apparently started getting lower in April and his antihypertensive medications were progressively tapered off Cortisol level was again low during his hospitalization in 5/16 He did not have any treatment instituted at this time Subsequently he continued to have problems with decreased appetite, low blood pressure and weight loss He has not had a Cortrosyn stimulation test done  Because of persistently low cortisol levels his PCP started him on hydrocortisone 15 mg a day in early June 2016 Subsequently continued to have problems with low blood pressure on standing up and feeling dizzy; also was having frequent near syncopal episodes and falling because of low blood pressure  His blood pressure was still low standing up on his initial consultation and he was told to start Florinef 0.1 mg daily However he still has not been able to get this from his pharmacy He is still taking midodrine started by his PCP and is feeling better.  Evaluation of ACTH level indicates this is relatively low at 2.9  Also he was asked to increase his hydrocortisone to a more therapeutic dose instead of 10 mg twice a day  Currently taking 20 mg hydrocortisone twice a day in divided doses     Past Medical History  Diagnosis Date  . Hypertension   .  Cirrhosis of liver   . Hypothyroidism   . Alcohol abuse   . CHF (congestive heart failure)     EF 45-50% 05/2012  . Hyperlipidemia   . Arthritis   . Laryngeal cancer 2011    Laryngectomy, T3N0M0  . Seizures     Past Surgical History  Procedure Laterality Date  . Tracheal surgery    . Tracheostomy    . Left and right heart catheterization with coronary angiogram N/A 12/28/2011    Procedure: LEFT AND RIGHT HEART CATHETERIZATION WITH CORONARY ANGIOGRAM;  Surgeon: Jolaine Artist, MD;  Location: [facility] CATH LAB;  Service: Cardiovascular;  Laterality: N/A;    . Esophagogastroduodenoscopy N/A 04/10/2014    Procedure: ESOPHAGOGASTRODUODENOSCOPY (EGD);  Surgeon: Missy Sabins, MD;  Location: [facility] ENDOSCOPY;  Service: Endoscopy;  Laterality: N/A;    Family History  Problem Relation Age of Onset  . Hyperlipidemia Mother   . Hypertension Mother   . Hypertension Father   . Hyperlipidemia Father     Social History:  reports that he has quit smoking. His smoking use included Cigarettes. He has a 10 pack-year smoking history. He has never used smokeless tobacco. He reports that he drinks alcohol. He reports that he does not use illicit drugs.  Allergies:  Allergies  Allergen Reactions  . Vicodin [Hydrocodone-Acetaminophen] Rash  . Other     Unknown anesthesia medicine caused cardiac arrest.  . Tylenol [Acetaminophen]     HBP -Liver Cirrhosis      Medication List       This list is accurate as of: 10/05/14 12:26 PM.  Always use your most recent med list.               aspirin EC 81 MG tablet  Take 81 mg by mouth daily.     bisoprolol 5 MG tablet  Commonly known as:  ZEBETA  TAKE 1 TABLET BY MOUTH EVERY DAY     dronabinol 10 MG capsule  Commonly known as:  MARINOL  Take 10 mg by mouth 2 (two) times daily before a meal.     feeding supplement (JEVITY 1.5 CAL) Liqd  Place 70 mL/hr into feeding tube continuous.     fentaNYL 25 MCG/HR patch  Commonly known as:  DURAGESIC - dosed mcg/hr  Place 1 patch (25 mcg total) onto the skin every 3 (three) days.     fluconazole 200 MG tablet  Commonly known as:  DIFLUCAN  Take 200 mg by mouth daily.     fludrocortisone 0.1 MG tablet  Commonly known as:  FLORINEF  Take 1 tablet (0.1 mg total) by mouth daily.     free water Soln  Place 175 mLs into feeding tube 4 (four) times daily.     guaiFENesin 600 MG 12 hr tablet  Commonly known as:  MUCINEX  Take 1 tablet (600 mg total) by mouth 2 (two) times daily.     hydrocortisone 20 MG tablet  Commonly known as:  CORTEF  Take 1 tab bid      levothyroxine 112 MCG tablet  Commonly known as:  SYNTHROID, LEVOTHROID  Take 1 tablet (112 mcg total) by mouth daily before breakfast. Take with 15mcg tablet.     levothyroxine 50 MCG tablet  Commonly known as:  SYNTHROID, LEVOTHROID  Take 1 tablet (50 mcg total) by mouth daily before breakfast. Take with 192mcg tablet.     lidocaine 2 % solution  Commonly known as:  XYLOCAINE  Use as directed 10 mLs in  the mouth or throat 4 (four) times daily as needed for mouth pain.     magic mouthwash w/lidocaine Soln  Take 5 mLs by mouth 4 (four) times daily.     nystatin 100000 UNIT/ML suspension  Commonly known as:  MYCOSTATIN     ondansetron 4 MG tablet  Commonly known as:  ZOFRAN  Take 1 tablet (4 mg total) by mouth every 8 (eight) hours as needed for nausea or vomiting.     oxyCODONE 5 MG/5ML solution  Commonly known as:  ROXICODONE  Take 5 mLs (5 mg total) by mouth every 4 (four) hours as needed for severe pain.        ROS He still having problems with decreased appetite          Examination:    BP 130/82 mmHg  Pulse 84  Temp(Src) 98.6 F (37 C) (Oral)  Resp 17  Ht 5\' 11"  (1.803 m)  Wt 122 lb (55.339 kg)  BMI 17.02 kg/m2  SpO2 99%  Standing blood pressure today 92/68     Assessments   1. Hypothyroidism, primary and previously significant with TSH level as high as 99 and historically requiring relatively large doses of levothyroxine supplements. His dosage requirement appears to be less with his weight loss His dose was reduced last month and will need to continue to 112 g dosage for now  2.  Adrenal insufficiency, secondary:  This has been associated with orthostatic hypotension also indicating both mineralocorticoid and adrenal steroid deficiency.  ACTH level indicates pituitary hypofunction  Subjectively he is doing better with increasing hydrocortisone to 20 mg twice a day He has not been able to get the Florinef started from the pharmacy and is still taking  low doses of midodrine Also orthostasis is not symptomatic currently  Treatment:    1.  Start Florinef every other day, he should be able to start this tomorrow when his medication is available 2. Stop midodrine tomorrow 3. From tomorrow reduce evening hydrocortisone to half tablet 4. Call if having any edema 5. Follow-up with PCP regarding decreased appetite 6. Check TSH level on the next visit  Patient Instructions  Hydrocortisone from 8/2 take 1 in am and 1/2 at dinner   Florinef every 2 days and stop Midodrine then     [facility] 10/05/2014, 12:26 PM

## 2014-10-05 NOTE — Patient Instructions (Addendum)
Hydrocortisone from 8/2 take 1 in am and 1/2 at dinner   Florinef every 2 days and stop Midodrine then

## 2014-10-07 DIAGNOSIS — R490 Dysphonia: Secondary | ICD-10-CM | POA: Diagnosis not present

## 2014-10-07 DIAGNOSIS — C321 Malignant neoplasm of supraglottis: Secondary | ICD-10-CM | POA: Diagnosis not present

## 2014-10-07 DIAGNOSIS — Z510 Encounter for antineoplastic radiation therapy: Secondary | ICD-10-CM | POA: Diagnosis not present

## 2014-10-07 DIAGNOSIS — C140 Malignant neoplasm of pharynx, unspecified: Secondary | ICD-10-CM | POA: Diagnosis not present

## 2014-10-07 DIAGNOSIS — R911 Solitary pulmonary nodule: Secondary | ICD-10-CM | POA: Diagnosis not present

## 2014-10-08 DIAGNOSIS — K861 Other chronic pancreatitis: Secondary | ICD-10-CM | POA: Diagnosis not present

## 2014-10-08 DIAGNOSIS — C140 Malignant neoplasm of pharynx, unspecified: Secondary | ICD-10-CM | POA: Diagnosis not present

## 2014-10-08 DIAGNOSIS — C321 Malignant neoplasm of supraglottis: Secondary | ICD-10-CM | POA: Diagnosis not present

## 2014-10-08 DIAGNOSIS — I5022 Chronic systolic (congestive) heart failure: Secondary | ICD-10-CM | POA: Diagnosis not present

## 2014-10-08 DIAGNOSIS — R1310 Dysphagia, unspecified: Secondary | ICD-10-CM | POA: Diagnosis not present

## 2014-10-08 DIAGNOSIS — E43 Unspecified severe protein-calorie malnutrition: Secondary | ICD-10-CM | POA: Diagnosis not present

## 2014-10-12 DIAGNOSIS — I5022 Chronic systolic (congestive) heart failure: Secondary | ICD-10-CM | POA: Diagnosis not present

## 2014-10-12 DIAGNOSIS — R1310 Dysphagia, unspecified: Secondary | ICD-10-CM | POA: Diagnosis not present

## 2014-10-12 DIAGNOSIS — E43 Unspecified severe protein-calorie malnutrition: Secondary | ICD-10-CM | POA: Diagnosis not present

## 2014-10-12 DIAGNOSIS — C140 Malignant neoplasm of pharynx, unspecified: Secondary | ICD-10-CM | POA: Diagnosis not present

## 2014-10-12 DIAGNOSIS — K861 Other chronic pancreatitis: Secondary | ICD-10-CM | POA: Diagnosis not present

## 2014-10-12 DIAGNOSIS — C321 Malignant neoplasm of supraglottis: Secondary | ICD-10-CM | POA: Diagnosis not present

## 2014-10-15 DIAGNOSIS — E43 Unspecified severe protein-calorie malnutrition: Secondary | ICD-10-CM | POA: Diagnosis not present

## 2014-10-15 DIAGNOSIS — C321 Malignant neoplasm of supraglottis: Secondary | ICD-10-CM | POA: Diagnosis not present

## 2014-10-15 DIAGNOSIS — C140 Malignant neoplasm of pharynx, unspecified: Secondary | ICD-10-CM | POA: Diagnosis not present

## 2014-10-15 DIAGNOSIS — K861 Other chronic pancreatitis: Secondary | ICD-10-CM | POA: Diagnosis not present

## 2014-10-15 DIAGNOSIS — I5022 Chronic systolic (congestive) heart failure: Secondary | ICD-10-CM | POA: Diagnosis not present

## 2014-10-15 DIAGNOSIS — R1310 Dysphagia, unspecified: Secondary | ICD-10-CM | POA: Diagnosis not present

## 2014-10-23 DIAGNOSIS — Z8521 Personal history of malignant neoplasm of larynx: Secondary | ICD-10-CM | POA: Diagnosis not present

## 2014-10-23 DIAGNOSIS — R918 Other nonspecific abnormal finding of lung field: Secondary | ICD-10-CM | POA: Diagnosis not present

## 2014-10-23 DIAGNOSIS — R911 Solitary pulmonary nodule: Secondary | ICD-10-CM | POA: Diagnosis not present

## 2014-10-23 DIAGNOSIS — C102 Malignant neoplasm of lateral wall of oropharynx: Secondary | ICD-10-CM | POA: Diagnosis not present

## 2014-10-23 DIAGNOSIS — C051 Malignant neoplasm of soft palate: Secondary | ICD-10-CM | POA: Diagnosis not present

## 2014-10-23 DIAGNOSIS — Z510 Encounter for antineoplastic radiation therapy: Secondary | ICD-10-CM | POA: Diagnosis not present

## 2014-10-26 ENCOUNTER — Other Ambulatory Visit: Payer: Self-pay | Admitting: Family Medicine

## 2014-10-28 ENCOUNTER — Other Ambulatory Visit: Payer: Medicare Other

## 2014-11-02 ENCOUNTER — Ambulatory Visit (INDEPENDENT_AMBULATORY_CARE_PROVIDER_SITE_OTHER): Payer: Medicare Other | Admitting: Endocrinology

## 2014-11-02 ENCOUNTER — Encounter: Payer: Self-pay | Admitting: Endocrinology

## 2014-11-02 VITALS — BP 138/92 | HR 102 | Temp 98.3°F | Resp 16 | Ht 71.0 in | Wt 126.8 lb

## 2014-11-02 DIAGNOSIS — E2740 Unspecified adrenocortical insufficiency: Secondary | ICD-10-CM | POA: Diagnosis not present

## 2014-11-02 DIAGNOSIS — E039 Hypothyroidism, unspecified: Secondary | ICD-10-CM | POA: Diagnosis not present

## 2014-11-02 LAB — BASIC METABOLIC PANEL
BUN: 15 mg/dL (ref 6–23)
CO2: 29 mEq/L (ref 19–32)
Calcium: 9.6 mg/dL (ref 8.4–10.5)
Chloride: 104 mEq/L (ref 96–112)
Creatinine, Ser: 0.85 mg/dL (ref 0.40–1.50)
GFR: 117.4 mL/min (ref >60.00)
GLUCOSE: 103 mg/dL — AB (ref 70–99)
POTASSIUM: 4 meq/L (ref 3.5–5.1)
SODIUM: 141 meq/L (ref 135–145)

## 2014-11-02 LAB — T4, FREE: Free T4: 1.2 ng/dL (ref 0.60–1.60)

## 2014-11-02 LAB — TSH: TSH: 0.18 u[IU]/mL — AB (ref 0.35–4.50)

## 2014-11-02 NOTE — Progress Notes (Signed)
[name], male   DOB: 05/14/1950, 64 y.o.   MRN: [id number]            Reason for Appointment:  Hypothyroidism and adrenal insufficiency, follow-up visit    History of Present Illness:   PROBLEM 1: Hypothyroidism was first diagnosed in 2012   The symptoms consistent with hypothyroidism  had been: fatigue, feeling sleepy but no known history of cold sensitivity, difficulty concentrating His medications have been adjusted periodically but TSH has never been normal since 2012 Not clear the reason why his TSH was significantly high in 2013 and also 2015; patient is not a good historian and difficult to communicate with him today His prescription history indicates that he has been taking anywhere between 150 up to 250 g of levothyroxine since about 2013 Earlier this year his TSH levels have been in the hyperthyroid range and his dose of levothyroxine on his initial consultation was 162 g daily The patient did complain of feeling palpitations and at times will get shaky.  Since the patient's free T4 level was markedly increased at 1.95 in 09/2014 he was told to reduce the dose to only 112 g   Does not complain of feeling excessively hot or having palpitations and shakiness  His weight has leveled off Thyroid levels are pending from today  TSH levels have been as follows:  Lab Results  Component Value Date   TSH <0.008* 09/23/2014   TSH 0.078* 05/29/2014   TSH <0.010* 04/10/2014   TSH 99.937* 05/09/2013   TSH 15.352* 12/04/2012   TSH 84.299* 12/24/2011   TSH 16.327* 01/05/2011   TSH 18.958* 12/12/2010   TSH 27.001* 11/15/2010   TSH 18.570* 09/26/2010    More recent thyroid levels:         Lab Results  Component Value Date   TSH <0.008* 09/23/2014   TSH 0.078* 05/29/2014   TSH <0.010* 04/10/2014   FREET4 1.95* 09/30/2014   FREET4 0.89 05/09/2013   FREET4 0.62* 12/26/2011     PROBLEM 2: ADRENAL insufficiency:  Patient was evaluated with a cortisol  level in his admission to the hospital for seizure and fever in 3/15 and was low but no treatment was instituted His blood pressure apparently started getting lower in April and his antihypertensive medications were progressively tapered off Cortisol level was again low during his hospitalization in 5/16 He did not have any treatment instituted at this time Subsequently he continued to have problems with decreased appetite, low blood pressure and weight loss He has not had a Cortrosyn stimulation test done Evaluation of ACTH level indicates this is relatively low at 2.9  Because of persistently low cortisol levels his PCP started him on hydrocortisone 15 mg a day in early June 2016 Subsequently continued to have problems with low blood pressure on standing up and feeling dizzy; also was having frequent near syncopal episodes and falling because of low blood pressure; was given midodrine  Subsequently because of his continued orthostasis he was started on Florinef every other day and midodrine stopped. He has not felt lightheaded recently and overall feels fairly good  Currently taking 20 mg hydrocortisone in the morning and 10 mg the evening No nausea.  Does not have much appetite but is on tube feeding    Past Medical History  Diagnosis Date  . Hypertension   . Cirrhosis of liver   . Hypothyroidism   . Alcohol abuse   . CHF (congestive heart failure)     EF  45-50% 05/2012  . Hyperlipidemia   . Arthritis   . Laryngeal cancer 2011    Laryngectomy, T3N0M0  . Seizures     Past Surgical History  Procedure Laterality Date  . Tracheal surgery    . Tracheostomy    . Left and right heart catheterization with coronary angiogram N/A 12/28/2011    Procedure: LEFT AND RIGHT HEART CATHETERIZATION WITH CORONARY ANGIOGRAM;  Surgeon: Jolaine Artist, MD;  Location: [facility] CATH LAB;  Service: Cardiovascular;  Laterality: N/A;  . Esophagogastroduodenoscopy N/A 04/10/2014    Procedure:  ESOPHAGOGASTRODUODENOSCOPY (EGD);  Surgeon: Missy Sabins, MD;  Location: [facility] ENDOSCOPY;  Service: Endoscopy;  Laterality: N/A;    Family History  Problem Relation Age of Onset  . Hyperlipidemia Mother   . Hypertension Mother   . Hypertension Father   . Hyperlipidemia Father     Social History:  reports that he has quit smoking. His smoking use included Cigarettes. He has a 10 pack-year smoking history. He has never used smokeless tobacco. He reports that he drinks alcohol. He reports that he does not use illicit drugs.  Allergies:  Allergies  Allergen Reactions  . Vicodin [Hydrocodone-Acetaminophen] Rash  . Other     Unknown anesthesia medicine caused cardiac arrest.  . Tylenol [Acetaminophen]     HBP -Liver Cirrhosis      Medication List       This list is accurate as of: 11/02/14  3:08 PM.  Always use your most recent med list.               amitriptyline 25 MG tablet  Commonly known as:  ELAVIL  Take 25 mg by mouth.     aspirin EC 81 MG tablet  Take 81 mg by mouth daily.     bisoprolol 5 MG tablet  Commonly known as:  ZEBETA  TAKE 1 TABLET BY MOUTH EVERY DAY     dronabinol 10 MG capsule  Commonly known as:  MARINOL  Take 10 mg by mouth 2 (two) times daily before a meal.     feeding supplement (JEVITY 1.5 CAL) Liqd  Place 70 mL/hr into feeding tube continuous.     fentaNYL 25 MCG/HR patch  Commonly known as:  DURAGESIC - dosed mcg/hr  Place 1 patch (25 mcg total) onto the skin every 3 (three) days.     fluconazole 200 MG tablet  Commonly known as:  DIFLUCAN  Take 200 mg by mouth daily.     fludrocortisone 0.1 MG tablet  Commonly known as:  FLORINEF  Take 1 tablet (0.1 mg total) by mouth daily.     free water Soln  Place 175 mLs into feeding tube 4 (four) times daily.     guaiFENesin 600 MG 12 hr tablet  Commonly known as:  MUCINEX  Take 1 tablet (600 mg total) by mouth 2 (two) times daily.     hydrocortisone 20 MG tablet  Commonly known as:   CORTEF  Take 1 tab bid     levothyroxine 112 MCG tablet  Commonly known as:  SYNTHROID, LEVOTHROID  Take 1 tablet (112 mcg total) by mouth daily before breakfast.     lidocaine 2 % solution  Commonly known as:  XYLOCAINE  Use as directed 10 mLs in the mouth or throat 4 (four) times daily as needed for mouth pain.     magic mouthwash w/lidocaine Soln  Take 5 mLs by mouth 4 (four) times daily.     nystatin 100000 UNIT/ML suspension  Commonly known as:  MYCOSTATIN     ondansetron 4 MG tablet  Commonly known as:  ZOFRAN  Take 1 tablet (4 mg total) by mouth every 8 (eight) hours as needed for nausea or vomiting.     oxyCODONE 5 MG/5ML solution  Commonly known as:  ROXICODONE  Take 5 mLs (5 mg total) by mouth every 4 (four) hours as needed for severe pain.        ROS  He still having problems with decreased appetite but is on tube feeding  Wt Readings from Last 3 Encounters:  11/02/14 126 lb 12.8 oz (57.516 kg)  10/05/14 122 lb (55.339 kg)  09/30/14 121 lb 12.8 oz (55.248 kg)          He has had previous history of hypertension.  Currently not taking any medication   Examination:    BP 138/92 mmHg  Pulse 102  Temp(Src) 98.3 F (36.8 C)  Resp 16  Ht 5\' 11"  (1.803 m)  Wt 126 lb 12.8 oz (57.516 kg)  BMI 17.69 kg/m2  SpO2 98%  Standing blood pressure today 134/90 on the right arm No ankle edema  Heart rate 100, regular  Assessments   1. Hypothyroidism, primary and previously significant with TSH level as high as 99 and historically requiring relatively large doses of levothyroxine supplements. His dosage requirement appears to be significantly less with his weight loss His dose was reduced about 6 weeks ago and will need to check his TSH level for reassessment  2.  Adrenal insufficiency, secondary:  This has been associated with orthostatic hypotension also indicating both mineralocorticoid and adrenal steroid deficiency.  ACTH level indicates pituitary  hypofunction  Subjectively he is doing better with increasing hydrocortisone to 30 mg daily Also orthostasis has resolved with taking Florinef every other day  However his blood pressure appears to have gone up too high levels now, also not clear why he is tachycardic  Treatment:    1.  Stop Florinef 2. Check thyroid levels and if normal consider restarting bisoprolol for continued tachycardia 3. Continue hydrocortisone unchanged   Patient Instructions  Stop Fludrocortisone       [name] 11/02/2014, 3:08 PM

## 2014-11-02 NOTE — Patient Instructions (Signed)
Stop Fludrocortisone

## 2014-11-02 NOTE — Progress Notes (Signed)
Quick Note:  Please let his daughter know that the thyroid level is nearly normal, stay on same dosage for now, restart 2.5 mg of bisoprolol  ______

## 2014-11-03 ENCOUNTER — Other Ambulatory Visit: Payer: Self-pay | Admitting: *Deleted

## 2014-11-03 MED ORDER — BISOPROLOL FUMARATE 5 MG PO TABS: ORAL_TABLET | ORAL | Status: DC

## 2014-12-14 ENCOUNTER — Other Ambulatory Visit (INDEPENDENT_AMBULATORY_CARE_PROVIDER_SITE_OTHER): Payer: Medicare Other

## 2014-12-14 DIAGNOSIS — E039 Hypothyroidism, unspecified: Secondary | ICD-10-CM | POA: Diagnosis not present

## 2014-12-14 DIAGNOSIS — E2740 Unspecified adrenocortical insufficiency: Secondary | ICD-10-CM | POA: Diagnosis not present

## 2014-12-14 LAB — COMPREHENSIVE METABOLIC PANEL
ALK PHOS: 116 U/L (ref 39–117)
ALT: 12 U/L (ref 0–53)
AST: 20 U/L (ref 0–37)
Albumin: 3.5 g/dL (ref 3.5–5.2)
BILIRUBIN TOTAL: 0.6 mg/dL (ref 0.2–1.2)
BUN: 11 mg/dL (ref 6–23)
CO2: 29 mEq/L (ref 19–32)
CREATININE: 0.83 mg/dL (ref 0.40–1.50)
Calcium: 9.3 mg/dL (ref 8.4–10.5)
Chloride: 101 mEq/L (ref 96–112)
GFR: 120.63 mL/min (ref >60.00)
GLUCOSE: 97 mg/dL (ref 70–99)
Potassium: 4.1 mEq/L (ref 3.5–5.1)
SODIUM: 137 meq/L (ref 135–145)
TOTAL PROTEIN: 7.8 g/dL (ref 6.0–8.3)

## 2014-12-14 LAB — T4, FREE: FREE T4: 0.55 ng/dL — AB (ref 0.60–1.60)

## 2014-12-14 LAB — TSH: TSH: 0.48 u[IU]/mL (ref 0.35–4.50)

## 2014-12-17 ENCOUNTER — Encounter: Payer: Self-pay | Admitting: Endocrinology

## 2014-12-17 ENCOUNTER — Ambulatory Visit (INDEPENDENT_AMBULATORY_CARE_PROVIDER_SITE_OTHER): Payer: Medicare Other | Admitting: Endocrinology

## 2014-12-17 VITALS — BP 122/84 | HR 114 | Temp 98.3°F | Resp 14 | Ht 71.0 in | Wt 129.2 lb

## 2014-12-17 DIAGNOSIS — E2740 Unspecified adrenocortical insufficiency: Secondary | ICD-10-CM | POA: Diagnosis not present

## 2014-12-17 DIAGNOSIS — Z23 Encounter for immunization: Secondary | ICD-10-CM | POA: Diagnosis not present

## 2014-12-17 DIAGNOSIS — E039 Hypothyroidism, unspecified: Secondary | ICD-10-CM

## 2014-12-17 NOTE — Progress Notes (Signed)
[name], male   DOB: Jul 16, 1950, 64 y.o.   MRN: [id number]            Reason for Appointment:  Hypothyroidism and adrenal insufficiency, follow-up visit    History of Present Illness:   PROBLEM 1: Hypothyroidism was first diagnosed in 2012   The symptoms consistent with hypothyroidism  had been: fatigue, feeling sleepy but no known history of cold sensitivity, difficulty concentrating His medications have been adjusted periodically but TSH has never been normal since 2012 Not clear the reason why his TSH was significantly high in 2013 and also 2015; patient is not a good historian and difficult to communicate with him today His prescription history indicates that he has been taking anywhere between 150 up to 250 g of levothyroxine since about 2013 Earlier this year his TSH levels were in the hyperthyroid range and his dose of levothyroxine on his initial consultation was 162 g daily The patient  did complain of feeling palpitations and at times will get shaky.  RECENT history: Since the patient's free T4 level was markedly increased at 1.95 in 09/2014 he was told to reduce the dose to only 112 g   Does not complain of feeling hot or having palpitations and shakiness  His energy level also is improving His weight has leveled off Thyroid levels are improved with TSH now finally normal for the first time Not clear why free T4 is slightly low  TSH levels have been as follows:  Lab Results  Component Value Date   TSH 0.48 12/14/2014   TSH 0.18* 11/02/2014   TSH <0.008* 09/23/2014   TSH 0.078* 05/29/2014   TSH <0.010* 04/10/2014   TSH 99.937* 05/09/2013   TSH 15.352* 12/04/2012   TSH 84.299* 12/24/2011   TSH 16.327* 01/05/2011   TSH 18.958* 12/12/2010    More recent thyroid levels:         Lab Results  Component Value Date   TSH 0.48 12/14/2014   TSH 0.18* 11/02/2014   TSH <0.008* 09/23/2014   FREET4 0.55* 12/14/2014   FREET4 1.20 11/02/2014   FREET4 1.95* 09/30/2014     PROBLEM 2: ADRENAL insufficiency:  His blood pressure apparently started getting low in April and his antihypertensive medications were progressively tapered off Cortisol level was low during his hospitalization in 5/16 Subsequently he continued to have problems with decreased appetite, low blood pressure and weight loss He has not had a Cortrosyn stimulation test done, since he was on hydrocortisone this was continued on increased to therapeutic levels  ACTH level when he was on low doses of hydrocortisone was relatively low at 2.9 indicating secondary adrenal insufficiency  Because of persistently low cortisol levels his PCP started him on hydrocortisone 15 mg a day in early June 2016 Subsequently continued to have problems with low blood pressure on standing up and feeling dizzy; also was having frequent near syncopal episodes and falling because of low blood pressure In 8/16 he was started on Florinef every other day and midodrine stopped.  RECENT history: He has not felt lightheaded recently and overall feels fairly good  Currently taking 20 mg hydrocortisone in the morning and 10 mg the evening and is compliant with this No nausea.  Does not have consistent appetite but is on tube feeding He has gained weight Electrolytes are normal  Wt Readings from Last 3 Encounters:  12/17/14 129 lb 3.2 oz (58.605 kg)  11/02/14 126 lb 12.8 oz (57.516 kg)  10/05/14 122 lb (  55.339 kg)    Lab Results  Component Value Date   CREATININE 0.83 12/14/2014   BUN 11 12/14/2014   NA 137 12/14/2014   K 4.1 12/14/2014   CL 101 12/14/2014   CO2 29 12/14/2014      Past Medical History  Diagnosis Date  . Hypertension   . Cirrhosis of liver ([place])   . Hypothyroidism   . Alcohol abuse   . CHF (congestive heart failure) ([place])     EF 45-50% 05/2012  . Hyperlipidemia   . Arthritis   . Laryngeal cancer ([place]) 2011    Laryngectomy, T3N0M0  . Seizures [facility])     Past  Surgical History  Procedure Laterality Date  . Tracheal surgery    . Tracheostomy    . Left and right heart catheterization with coronary angiogram N/A 12/28/2011    Procedure: LEFT AND RIGHT HEART CATHETERIZATION WITH CORONARY ANGIOGRAM;  Surgeon: Jolaine Artist, MD;  Location: [facility] CATH LAB;  Service: Cardiovascular;  Laterality: N/A;  . Esophagogastroduodenoscopy N/A 04/10/2014    Procedure: ESOPHAGOGASTRODUODENOSCOPY (EGD);  Surgeon: Missy Sabins, MD;  Location: [facility] ENDOSCOPY;  Service: Endoscopy;  Laterality: N/A;    Family History  Problem Relation Age of Onset  . Hyperlipidemia Mother   . Hypertension Mother   . Hypertension Father   . Hyperlipidemia Father     Social History:  reports that he has quit smoking. His smoking use included Cigarettes. He has a 10 pack-year smoking history. He has never used smokeless tobacco. He reports that he drinks alcohol. He reports that he does not use illicit drugs.  Allergies:  Allergies  Allergen Reactions  . Vicodin [Hydrocodone-Acetaminophen] Rash  . Other     Unknown anesthesia medicine caused cardiac arrest.  . Tylenol [Acetaminophen]     HBP -Liver Cirrhosis      Medication List       This list is accurate as of: 12/17/14  8:33 PM.  Always use your most recent med list.               amitriptyline 25 MG tablet  Commonly known as:  ELAVIL  Take 25 mg by mouth.     aspirin EC 81 MG tablet  Take 81 mg by mouth daily.     bisoprolol 5 MG tablet  Commonly known as:  ZEBETA  Take 1/2 tablet daily     dronabinol 10 MG capsule  Commonly known as:  MARINOL  Take 10 mg by mouth 2 (two) times daily before a meal.     feeding supplement (JEVITY 1.5 CAL) Liqd  Place 70 mL/hr into feeding tube continuous.     fentaNYL 25 MCG/HR patch  Commonly known as:  DURAGESIC - dosed mcg/hr  Place 1 patch (25 mcg total) onto the skin every 3 (three) days.     fluconazole 200 MG tablet  Commonly known as:  DIFLUCAN  Take 200 mg by  mouth daily.     fludrocortisone 0.1 MG tablet  Commonly known as:  FLORINEF  Take 1 tablet (0.1 mg total) by mouth daily.     free water Soln  Place 175 mLs into feeding tube 4 (four) times daily.     guaiFENesin 600 MG 12 hr tablet  Commonly known as:  MUCINEX  Take 1 tablet (600 mg total) by mouth 2 (two) times daily.     hydrocortisone 20 MG tablet  Commonly known as:  CORTEF  Take 1 tab bid  levothyroxine 112 MCG tablet  Commonly known as:  SYNTHROID, LEVOTHROID  Take 1 tablet (112 mcg total) by mouth daily before breakfast.     lidocaine 2 % solution  Commonly known as:  XYLOCAINE  Use as directed 10 mLs in the mouth or throat 4 (four) times daily as needed for mouth pain.     magic mouthwash w/lidocaine Soln  Take 5 mLs by mouth 4 (four) times daily.     nystatin 100000 UNIT/ML suspension  Commonly known as:  MYCOSTATIN     ondansetron 4 MG tablet  Commonly known as:  ZOFRAN  Take 1 tablet (4 mg total) by mouth every 8 (eight) hours as needed for nausea or vomiting.     oxyCODONE 5 MG/5ML solution  Commonly known as:  ROXICODONE  Take 5 mLs (5 mg total) by mouth every 4 (four) hours as needed for severe pain.        ROS        He has had previous history of hypertension.  Currently only on 2.5 mg of bisoprolol since blood pressure was low normal.  He continues to have problems with rapid heart rate although he does not complain of symptoms.  Cardiac follow-up pending   Examination:    BP 122/84 mmHg  Pulse 114  Temp(Src) 98.3 F (36.8 C)  Resp 14  Ht 5\' 11"  (1.803 m)  Wt 129 lb 3.2 oz (58.605 kg)  BMI 18.03 kg/m2  SpO2 99%  Standing blood pressure today was lower on the left at 98/80 and higher on the right and 122/84 No ankle edema  Heart rate 96, regular  Assessments   1. Hypothyroidism, primary and previously significant with TSH level as high as 99 and historically requiring relatively large doses of levothyroxine supplements. His  dosage requirement appears to be significantly less with his weight loss With current does of 112 g his TSH is back to normal Not clear why his free T4 is slightly low.  Subjectively feels fairly good now  2.  Adrenal insufficiency, secondary:  This has been associated with orthostatic hypotension also indicating both mineralocorticoid and adrenal steroid deficiency.    Subjectively he is doing better with  hydrocortisone total 30 mg daily Also orthostasis has resolved with taking Florinef; this was stopped on his last visit because of relatively high blood pressure This may be related to his history of hypertension  Again his blood pressure is slightly high today on repeat measurement is 50 on the right side   Treatment:    1. Increase bisoprolol to 5 mg and follow-up with cardiologist 2. Continue hydrocortisone unchanged 3. Closely follow thyroid levels and consider increasing Synthroid dose if his free T4 level continues to be low which may indicate secondary hypothyroidism also   Patient Instructions  Full tab of Bisoprolol     Claudia Greenley 12/17/2014, 8:33 PM

## 2014-12-17 NOTE — Patient Instructions (Signed)
Full tab of Bisoprolol

## 2015-01-22 DIAGNOSIS — K8020 Calculus of gallbladder without cholecystitis without obstruction: Secondary | ICD-10-CM | POA: Diagnosis not present

## 2015-01-22 DIAGNOSIS — C051 Malignant neoplasm of soft palate: Secondary | ICD-10-CM | POA: Diagnosis not present

## 2015-01-22 DIAGNOSIS — C102 Malignant neoplasm of lateral wall of oropharynx: Secondary | ICD-10-CM | POA: Diagnosis not present

## 2015-01-22 DIAGNOSIS — R635 Abnormal weight gain: Secondary | ICD-10-CM | POA: Diagnosis not present

## 2015-01-22 DIAGNOSIS — Z923 Personal history of irradiation: Secondary | ICD-10-CM | POA: Diagnosis not present

## 2015-01-22 DIAGNOSIS — Z08 Encounter for follow-up examination after completed treatment for malignant neoplasm: Secondary | ICD-10-CM | POA: Diagnosis not present

## 2015-01-22 DIAGNOSIS — C321 Malignant neoplasm of supraglottis: Secondary | ICD-10-CM | POA: Diagnosis not present

## 2015-01-22 DIAGNOSIS — Z85819 Personal history of malignant neoplasm of unspecified site of lip, oral cavity, and pharynx: Secondary | ICD-10-CM | POA: Diagnosis not present

## 2015-01-26 DIAGNOSIS — I509 Heart failure, unspecified: Secondary | ICD-10-CM | POA: Diagnosis not present

## 2015-01-26 DIAGNOSIS — Z85818 Personal history of malignant neoplasm of other sites of lip, oral cavity, and pharynx: Secondary | ICD-10-CM | POA: Diagnosis not present

## 2015-01-26 DIAGNOSIS — Z8521 Personal history of malignant neoplasm of larynx: Secondary | ICD-10-CM | POA: Diagnosis not present

## 2015-01-26 DIAGNOSIS — Z08 Encounter for follow-up examination after completed treatment for malignant neoplasm: Secondary | ICD-10-CM | POA: Diagnosis not present

## 2015-01-26 DIAGNOSIS — E039 Hypothyroidism, unspecified: Secondary | ICD-10-CM | POA: Diagnosis not present

## 2015-02-03 DIAGNOSIS — K7460 Unspecified cirrhosis of liver: Secondary | ICD-10-CM | POA: Insufficient documentation

## 2015-02-03 DIAGNOSIS — I10 Essential (primary) hypertension: Secondary | ICD-10-CM | POA: Diagnosis not present

## 2015-02-03 DIAGNOSIS — R9431 Abnormal electrocardiogram [ECG] [EKG]: Secondary | ICD-10-CM | POA: Diagnosis not present

## 2015-02-05 ENCOUNTER — Other Ambulatory Visit: Payer: Self-pay | Admitting: Family Medicine

## 2015-02-05 NOTE — Telephone Encounter (Signed)
Refill request from pharmacy. Will forward to PCP for review. Dyson Sevey, CMA. 

## 2015-02-08 DIAGNOSIS — Z8521 Personal history of malignant neoplasm of larynx: Secondary | ICD-10-CM | POA: Diagnosis not present

## 2015-02-08 DIAGNOSIS — K222 Esophageal obstruction: Secondary | ICD-10-CM | POA: Diagnosis not present

## 2015-02-08 DIAGNOSIS — Z930 Tracheostomy status: Secondary | ICD-10-CM | POA: Diagnosis not present

## 2015-02-08 DIAGNOSIS — R491 Aphonia: Secondary | ICD-10-CM | POA: Diagnosis not present

## 2015-02-08 NOTE — Telephone Encounter (Signed)
2nd request for this medication received. Kiannah Grunow,CMA

## 2015-02-15 DIAGNOSIS — R490 Dysphonia: Secondary | ICD-10-CM | POA: Diagnosis not present

## 2015-02-23 ENCOUNTER — Ambulatory Visit: Payer: Medicare Other | Admitting: Endocrinology

## 2015-03-09 DIAGNOSIS — R490 Dysphonia: Secondary | ICD-10-CM | POA: Diagnosis not present

## 2015-03-16 DIAGNOSIS — R490 Dysphonia: Secondary | ICD-10-CM | POA: Diagnosis not present

## 2015-03-23 ENCOUNTER — Other Ambulatory Visit (INDEPENDENT_AMBULATORY_CARE_PROVIDER_SITE_OTHER): Payer: Medicare Other

## 2015-03-23 DIAGNOSIS — E2740 Unspecified adrenocortical insufficiency: Secondary | ICD-10-CM

## 2015-03-23 DIAGNOSIS — E039 Hypothyroidism, unspecified: Secondary | ICD-10-CM | POA: Diagnosis not present

## 2015-03-23 LAB — BASIC METABOLIC PANEL
BUN: 9 mg/dL (ref 6–23)
CALCIUM: 8.5 mg/dL (ref 8.4–10.5)
CO2: 26 meq/L (ref 19–32)
CREATININE: 0.95 mg/dL (ref 0.40–1.50)
Chloride: 106 mEq/L (ref 96–112)
GFR: 103.13 mL/min (ref >60.00)
GLUCOSE: 92 mg/dL (ref 70–99)
Potassium: 4.1 mEq/L (ref 3.5–5.1)
Sodium: 138 mEq/L (ref 135–145)

## 2015-03-23 LAB — TSH: TSH: 4.84 u[IU]/mL — ABNORMAL HIGH (ref 0.35–4.50)

## 2015-03-23 LAB — T4, FREE: Free T4: 0.61 ng/dL (ref 0.60–1.60)

## 2015-03-24 DIAGNOSIS — R490 Dysphonia: Secondary | ICD-10-CM | POA: Diagnosis not present

## 2015-03-26 ENCOUNTER — Ambulatory Visit (INDEPENDENT_AMBULATORY_CARE_PROVIDER_SITE_OTHER): Payer: Medicare Other | Admitting: Endocrinology

## 2015-03-26 ENCOUNTER — Encounter: Payer: Self-pay | Admitting: Endocrinology

## 2015-03-26 VITALS — BP 84/56 | HR 93 | Temp 98.6°F | Resp 14 | Ht 71.0 in | Wt 134.2 lb

## 2015-03-26 DIAGNOSIS — E2740 Unspecified adrenocortical insufficiency: Secondary | ICD-10-CM | POA: Diagnosis not present

## 2015-03-26 DIAGNOSIS — I951 Orthostatic hypotension: Secondary | ICD-10-CM | POA: Diagnosis not present

## 2015-03-26 NOTE — Patient Instructions (Addendum)
Florinef every 2 days  Try to stop Elavil  Bisoprolol 1/2 daily

## 2015-03-26 NOTE — Progress Notes (Signed)
[name], male   DOB: November 24, 1950, 65 y.o.   MRN: [id number]            Reason for Appointment:  Hypothyroidism and adrenal insufficiency, follow-up visit    History of Present Illness:   PROBLEM 1: Hypothyroidism was first diagnosed in 2012   The symptoms consistent with hypothyroidism  had been: fatigue, feeling sleepy but no known history of cold sensitivity, difficulty concentrating His medications have been adjusted periodically but TSH has never been normal since 2012 Not clear the reason why his TSH was significantly high in 2013 and also 2015; patient is not a good historian and difficult to communicate with him today His prescription history indicates that he has been taking anywhere between 150 up to 250 g of levothyroxine since about 2013 Earlier this year his TSH levels were in the hyperthyroid range and his dose of levothyroxine on his initial consultation was 162 g daily The patient  did complain of feeling palpitations and at times will get shaky.  RECENT history: Since the patient's free T4 level was markedly increased at 1.95 in 09/2014 he was told to reduce the dose to 112 g Subsequently his thyroid levels have progressively improved    Does not complain of any fatigue, has gained back some weight gradually TSH level minimally higher now compared to previous levels but free T4 is not is low  TSH levels have been as follows:  Lab Results  Component Value Date   TSH 4.84* 03/23/2015   TSH 0.48 12/14/2014   TSH 0.18* 11/02/2014   TSH <0.008* 09/23/2014   TSH 0.078* 05/29/2014   TSH <0.010* 04/10/2014   TSH 99.937* 05/09/2013   TSH 15.352* 12/04/2012   TSH 84.299* 12/24/2011   TSH 16.327* 01/05/2011    More recent thyroid levels:         Lab Results  Component Value Date   TSH 4.84* 03/23/2015   TSH 0.48 12/14/2014   TSH 0.18* 11/02/2014   FREET4 0.61 03/23/2015   FREET4 0.55* 12/14/2014   FREET4 1.20 11/02/2014     PROBLEM 2:  ADRENAL insufficiency:  His blood pressure apparently started getting low in April and his antihypertensive medications were progressively tapered off Cortisol level was low during his hospitalization in 5/16 Subsequently he continued to have problems with decreased appetite, low blood pressure and weight loss He has not had a Cortrosyn stimulation test done, since he was on hydrocortisone this was continued on increased to therapeutic levels  ACTH level when he was on low doses of hydrocortisone was relatively low at 2.9 indicating secondary adrenal insufficiency  Because of persistently low cortisol levels his PCP started him on hydrocortisone 15 mg a day in early June 2016 Subsequently continued to have problems with low blood pressure on standing up and feeling dizzy; also was having frequent near syncopal episodes and falling because of low blood pressure In 8/16 he was started on Florinef every other day and midodrine stopped.  RECENT history: He has not felt lightheaded recently  Has normal appetite and no nausea.  Weight is improving  Currently taking 20 mg hydrocortisone in the morning and 10 mg the evening and is compliant with this, has been monitored by his daughter Electrolytes are normal  Wt Readings from Last 3 Encounters:  03/26/15 134 lb 3.2 oz (60.873 kg)  12/17/14 129 lb 3.2 oz (58.605 kg)  11/02/14 126 lb 12.8 oz (57.516 kg)    Lab Results  Component Value Date  CREATININE 0.95 03/23/2015   BUN 9 03/23/2015   NA 138 03/23/2015   K 4.1 03/23/2015   CL 106 03/23/2015   CO2 26 03/23/2015      Past Medical History  Diagnosis Date  . Hypertension   . Cirrhosis of liver ([place])   . Hypothyroidism   . Alcohol abuse   . CHF (congestive heart failure) ([place])     EF 45-50% 05/2012  . Hyperlipidemia   . Arthritis   . Laryngeal cancer ([place]) 2011    Laryngectomy, T3N0M0  . Seizures [facility])     Past Surgical History  Procedure Laterality Date  . Tracheal  surgery    . Tracheostomy    . Left and right heart catheterization with coronary angiogram N/A 12/28/2011    Procedure: LEFT AND RIGHT HEART CATHETERIZATION WITH CORONARY ANGIOGRAM;  Surgeon: Jolaine Artist, MD;  Location: [facility] CATH LAB;  Service: Cardiovascular;  Laterality: N/A;  . Esophagogastroduodenoscopy N/A 04/10/2014    Procedure: ESOPHAGOGASTRODUODENOSCOPY (EGD);  Surgeon: Missy Sabins, MD;  Location: [facility] ENDOSCOPY;  Service: Endoscopy;  Laterality: N/A;    Family History  Problem Relation Age of Onset  . Hyperlipidemia Mother   . Hypertension Mother   . Hypertension Father   . Hyperlipidemia Father     Social History:  reports that he has quit smoking. His smoking use included Cigarettes. He has a 10 pack-year smoking history. He has never used smokeless tobacco. He reports that he drinks alcohol. He reports that he does not use illicit drugs.  Allergies:  Allergies  Allergen Reactions  . Vicodin [Hydrocodone-Acetaminophen] Rash  . Other     Unknown anesthesia medicine caused cardiac arrest.  . Tylenol [Acetaminophen]     HBP -Liver Cirrhosis      Medication List       This list is accurate as of: 03/26/15 11:04 AM.  Always use your most recent med list.               amitriptyline 25 MG tablet  Commonly known as:  ELAVIL  Take 25 mg by mouth.     aspirin EC 81 MG tablet  Take 81 mg by mouth daily.     bisoprolol 5 MG tablet  Commonly known as:  ZEBETA  Take 1/2 tablet daily     dronabinol 10 MG capsule  Commonly known as:  MARINOL  Take 10 mg by mouth 2 (two) times daily before a meal.     feeding supplement (JEVITY 1.5 CAL) Liqd  Place 70 mL/hr into feeding tube continuous.     fentaNYL 25 MCG/HR patch  Commonly known as:  DURAGESIC - dosed mcg/hr  Place 1 patch (25 mcg total) onto the skin every 3 (three) days.     fluconazole 200 MG tablet  Commonly known as:  DIFLUCAN  Take 200 mg by mouth daily.     fludrocortisone 0.1 MG tablet    Commonly known as:  FLORINEF  Take 1 tablet (0.1 mg total) by mouth daily.     free water Soln  Place 175 mLs into feeding tube 4 (four) times daily.     guaiFENesin 600 MG 12 hr tablet  Commonly known as:  MUCINEX  Take 1 tablet (600 mg total) by mouth 2 (two) times daily.     hydrocortisone 20 MG tablet  Commonly known as:  CORTEF  Take 1 tab bid     levothyroxine 112 MCG tablet  Commonly known as:  SYNTHROID, LEVOTHROID  TAKE  1 TABLET(112 MCG) BY MOUTH DAILY BEFORE BREAKFAST     lidocaine 2 % solution  Commonly known as:  XYLOCAINE  Use as directed 10 mLs in the mouth or throat 4 (four) times daily as needed for mouth pain.     magic mouthwash w/lidocaine Soln  Take 5 mLs by mouth 4 (four) times daily.     nystatin 100000 UNIT/ML suspension  Commonly known as:  MYCOSTATIN     ondansetron 4 MG tablet  Commonly known as:  ZOFRAN  Take 1 tablet (4 mg total) by mouth every 8 (eight) hours as needed for nausea or vomiting.     oxyCODONE 5 MG/5ML solution  Commonly known as:  ROXICODONE  Take 5 mLs (5 mg total) by mouth every 4 (four) hours as needed for severe pain.        ROS        He has had previous history of hypertension.  Currently only on 2.5 mg of bisoprolol since blood pressure was low normal.  He continues to have problems with rapid heart rate although he does not complain of symptoms.  Cardiac follow-up pending   Examination:    BP 84/56 mmHg  Pulse 93  Temp(Src) 98.6 F (37 C)  Resp 14  Ht 5\' 11"  (1.803 m)  Wt 134 lb 3.2 oz (60.873 kg)  BMI 18.73 kg/m2  SpO2 96%  Standing blood pressure today on the left side 82/64 No ankle edema  Heart rate 96, regula. r  Assessments   1. Hypothyroidism, primary and previously significant with TSH level as high as 99 and historically requiring relatively large doses of levothyroxine supplements. His dosage requirement appears to be stable now with 112 g He is gaining back some weight and may require a  gradual increase in dose again Since he is feeling fairly well currently will continue the same regimen for now   2.  Adrenal insufficiency, secondary:  This has been associated with orthostatic hypotension also indicating both mineralocorticoid and adrenal steroid deficiency.    Subjectively he is doing well with  hydrocortisone total 30 mg daily Sodium is normal Now his blood pressure has starting get low again He is not symptomatic at present   3. ?  Hypertension.  He has had marked variability in his blood pressure readings but blood pressure is low today with only taking bisoprolol   Treatment:    1. Decrease bisoprolol to 2.5 mg 2. Start Florinef every other day 3. Continue hydrocortisone unchanged 4. Periodically follow thyroid levels and consider increasing Synthroid dose if his TSH is any higher 5. Recommend that he try to stop Elavil which has been given for neck pain, this may be causing persistent tachycardia   Patient Instructions  Florinef every 2 days  Try to stop Elavil  Bisoprolol 1/2 daily       Slade Pierpoint 03/26/2015, 11:04 AM

## 2015-04-26 ENCOUNTER — Ambulatory Visit (INDEPENDENT_AMBULATORY_CARE_PROVIDER_SITE_OTHER): Payer: Medicare Other | Admitting: Endocrinology

## 2015-04-26 ENCOUNTER — Encounter: Payer: Self-pay | Admitting: Endocrinology

## 2015-04-26 VITALS — BP 140/90 | HR 108 | Temp 99.0°F | Resp 14 | Ht 71.0 in | Wt 131.6 lb

## 2015-04-26 DIAGNOSIS — E039 Hypothyroidism, unspecified: Secondary | ICD-10-CM | POA: Diagnosis not present

## 2015-04-26 DIAGNOSIS — I951 Orthostatic hypotension: Secondary | ICD-10-CM

## 2015-04-26 DIAGNOSIS — E2740 Unspecified adrenocortical insufficiency: Secondary | ICD-10-CM | POA: Diagnosis not present

## 2015-04-26 MED ORDER — NEBIVOLOL HCL 5 MG PO TABS: 5.0000 mg | ORAL_TABLET | Freq: Every day | ORAL | Status: DC

## 2015-04-26 NOTE — Patient Instructions (Signed)
Take Bystolic instead of bisoprolol  Take Florinef 2x per week, Tuesdays, Fridays

## 2015-04-26 NOTE — Progress Notes (Signed)
[name], male   DOB: 10-24-1950, 65 y.o.   MRN: [id number]            Reason for Appointment:   adrenal insufficiency, follow-up visit    History of Present Illness:   PROBLEM 1:    ADRENAL insufficiency:  His blood pressure apparently started getting low in April and his antihypertensive medications were progressively tapered off Cortisol level was low during his hospitalization in 5/16 Subsequently he continued to have problems with decreased appetite, low blood pressure and weight loss He has not had a Cortrosyn stimulation test done, since he was on hydrocortisone this was continued on increased to therapeutic levels  ACTH level when he was on low doses of hydrocortisone was relatively low at 2.9 indicating secondary adrenal insufficiency  Because of persistently low cortisol levels his PCP started him on hydrocortisone 15 mg a day in early June 2016 Subsequently continued to have problems with low blood pressure on standing up and feeling dizzy; also was having frequent near syncopal episodes and falling because of low blood pressure In 8/16 he was started on Florinef every other day and midodrine stopped.  RECENT history:  He was started back on Florinef in 1/17 when his standing blood pressure was only 84/56, now taking 1 tablet on Monday/Wednesday/Friday He has not felt lightheaded recently and does not feel any different Has normal appetite and no nausea.  Weight is improving  Also taking 20 mg hydrocortisone in the morning and 10 mg the evening and is compliant with this, has been monitored by his daughter   PROBLEM 2: Hypothyroidism was first diagnosed in 2012  The following is a copy of the previous notes:   The symptoms consistent with hypothyroidism  had been: fatigue, feeling sleepy but no known history of cold sensitivity, difficulty concentrating His medications have been adjusted periodically but TSH has never been normal since 2012 Not  clear the reason why his TSH was significantly high in 2013 and also 2015; patient is not a good historian and difficult to communicate with him today His prescription history indicates that he has been taking anywhere between 150 up to 250 g of levothyroxine since about 2013 Earlier this year his TSH levels were in the hyperthyroid range and his dose of levothyroxine on his initial consultation was 162 g daily The patient  did complain of feeling palpitations and at times will get shaky.  RECENT history: Since the patient's free T4 level was markedly increased at 1.95 in 09/2014 he was told to reduce the dose to 112 g Subsequently his thyroid levels have progressively improved  Does not complain of any fatigue, has gained back some weight gradually TSH level minimally higher now compared to previous levels but free T4 is not is low  TSH levels have been as follows:  Lab Results  Component Value Date   TSH 4.84* 03/23/2015   TSH 0.48 12/14/2014   TSH 0.18* 11/02/2014   TSH <0.008* 09/23/2014   TSH 0.078* 05/29/2014   TSH <0.010* 04/10/2014   TSH 99.937* 05/09/2013   TSH 15.352* 12/04/2012   TSH 84.299* 12/24/2011   TSH 16.327* 01/05/2011    More recent thyroid levels:         Lab Results  Component Value Date   TSH 4.84* 03/23/2015   TSH 0.48 12/14/2014   TSH 0.18* 11/02/2014   FREET4 0.61 03/23/2015   FREET4 0.55* 12/14/2014   FREET4 1.20 11/02/2014      Wt Readings  from Last 3 Encounters:  04/26/15 131 lb 9.6 oz (59.693 kg)  03/26/15 134 lb 3.2 oz (60.873 kg)  12/17/14 129 lb 3.2 oz (58.605 kg)    Lab Results  Component Value Date   CREATININE 0.95 03/23/2015   BUN 9 03/23/2015   NA 138 03/23/2015   K 4.1 03/23/2015   CL 106 03/23/2015   CO2 26 03/23/2015      Past Medical History  Diagnosis Date  . Hypertension   . Cirrhosis of liver ([place])   . Hypothyroidism   . Alcohol abuse   . CHF (congestive heart failure) ([place])     EF 45-50% 05/2012  .  Hyperlipidemia   . Arthritis   . Laryngeal cancer ([place]) 2011    Laryngectomy, T3N0M0  . Seizures [facility])     Past Surgical History  Procedure Laterality Date  . Tracheal surgery    . Tracheostomy    . Left and right heart catheterization with coronary angiogram N/A 12/28/2011    Procedure: LEFT AND RIGHT HEART CATHETERIZATION WITH CORONARY ANGIOGRAM;  Surgeon: Jolaine Artist, MD;  Location: [facility] CATH LAB;  Service: Cardiovascular;  Laterality: N/A;  . Esophagogastroduodenoscopy N/A 04/10/2014    Procedure: ESOPHAGOGASTRODUODENOSCOPY (EGD);  Surgeon: Missy Sabins, MD;  Location: [facility] ENDOSCOPY;  Service: Endoscopy;  Laterality: N/A;    Family History  Problem Relation Age of Onset  . Hyperlipidemia Mother   . Hypertension Mother   . Hypertension Father   . Hyperlipidemia Father     Social History:  reports that he has quit smoking. His smoking use included Cigarettes. He has a 10 pack-year smoking history. He has never used smokeless tobacco. He reports that he drinks alcohol. He reports that he does not use illicit drugs.  Allergies:  Allergies  Allergen Reactions  . Vicodin [Hydrocodone-Acetaminophen] Rash  . Other     Unknown anesthesia medicine caused cardiac arrest.  . Tylenol [Acetaminophen]     HBP -Liver Cirrhosis      Medication List       This list is accurate as of: 04/26/15  9:49 AM.  Always use your most recent med list.               amitriptyline 25 MG tablet  Commonly known as:  ELAVIL  Take 25 mg by mouth.     aspirin EC 81 MG tablet  Take 81 mg by mouth daily.     dronabinol 10 MG capsule  Commonly known as:  MARINOL  Take 10 mg by mouth 2 (two) times daily before a meal.     feeding supplement (JEVITY 1.5 CAL) Liqd  Place 70 mL/hr into feeding tube continuous.     fentaNYL 25 MCG/HR patch  Commonly known as:  DURAGESIC - dosed mcg/hr  Place 1 patch (25 mcg total) onto the skin every 3 (three) days.     fluconazole 200 MG tablet  Commonly  known as:  DIFLUCAN  Take 200 mg by mouth daily.     fludrocortisone 0.1 MG tablet  Commonly known as:  FLORINEF  Take 1 tablet (0.1 mg total) by mouth daily.     free water Soln  Place 175 mLs into feeding tube 4 (four) times daily.     guaiFENesin 600 MG 12 hr tablet  Commonly known as:  MUCINEX  Take 1 tablet (600 mg total) by mouth 2 (two) times daily.     hydrocortisone 20 MG tablet  Commonly known as:  CORTEF  Take  1 tab bid     levothyroxine 112 MCG tablet  Commonly known as:  SYNTHROID, LEVOTHROID  TAKE 1 TABLET(112 MCG) BY MOUTH DAILY BEFORE BREAKFAST     lidocaine 2 % solution  Commonly known as:  XYLOCAINE  Use as directed 10 mLs in the mouth or throat 4 (four) times daily as needed for mouth pain.     magic mouthwash w/lidocaine Soln  Take 5 mLs by mouth 4 (four) times daily.     nebivolol 5 MG tablet  Commonly known as:  BYSTOLIC  Take 1 tablet (5 mg total) by mouth daily.     nystatin 100000 UNIT/ML suspension  Commonly known as:  MYCOSTATIN     ondansetron 4 MG tablet  Commonly known as:  ZOFRAN  Take 1 tablet (4 mg total) by mouth every 8 (eight) hours as needed for nausea or vomiting.     oxyCODONE 5 MG/5ML solution  Commonly known as:  ROXICODONE  Take 5 mLs (5 mg total) by mouth every 4 (four) hours as needed for severe pain.        ROS        He has had previous history of hypertension.  Currently on 5 mg of bisoprolol  blood pressure   He continues to have problems with rapid heart rate although he does not complain of symptoms.  Cardiac follow-up still pending He was told to stop Elavil and he has done this   Examination:    BP 140/90 mmHg  Pulse 108  Temp(Src) 99 F (37.2 C)  Resp 14  Ht 5\' 11"  (1.803 m)  Wt 131 lb 9.6 oz (59.693 kg)  BMI 18.36 kg/m2  SpO2 96%  Standing blood pressure today on the left side 140/90  No ankle edema  Heart rate 108   Assessments   1.  Sinus tachycardia.  Etiology unclear.  He is  symptomatic  2.  Adrenal insufficiency, secondary:  This has been associated with orthostatic hypotension also indicating both mineralocorticoid and adrenal steroid deficiency.    Subjectively he is doing well with  hydrocortisone total 30 mg daily Sodium is normal Now his blood pressure has starting get low again He is not symptomatic at present   3.  Hypertension.  He has had marked variability in his blood pressure readings but blood pressure is unusually high today even with adding only small dose of Florinef   Treatment:    1. He will take Florinef only twice a week 2. Follow-up in 1 month 3. For evaluation of his tachycardia and blood pressure he will see the cardiologist again 4. No change in hydrocortisone or levothyroxine 5. Trial of BYSTOLIC instead of bisoprolol for blood pressure and this may affect his orthostasis less 6. Call if having any new symptoms.  Will check his thyroid again on the next visit   Patient Instructions  Take Bystolic instead of bisoprolol  Take Florinef 2x per week, Tuesdays, Fridays      [facility] 04/26/2015, 9:49 AM

## 2015-04-27 DIAGNOSIS — R490 Dysphonia: Secondary | ICD-10-CM | POA: Diagnosis not present

## 2015-04-30 DIAGNOSIS — Z681 Body mass index (BMI) 19 or less, adult: Secondary | ICD-10-CM | POA: Diagnosis not present

## 2015-04-30 DIAGNOSIS — Z9002 Acquired absence of larynx: Secondary | ICD-10-CM | POA: Diagnosis not present

## 2015-04-30 DIAGNOSIS — Z9889 Other specified postprocedural states: Secondary | ICD-10-CM | POA: Diagnosis not present

## 2015-04-30 DIAGNOSIS — Z931 Gastrostomy status: Secondary | ICD-10-CM | POA: Diagnosis not present

## 2015-04-30 DIAGNOSIS — Z923 Personal history of irradiation: Secondary | ICD-10-CM | POA: Diagnosis not present

## 2015-04-30 DIAGNOSIS — C321 Malignant neoplasm of supraglottis: Secondary | ICD-10-CM | POA: Diagnosis not present

## 2015-04-30 DIAGNOSIS — C051 Malignant neoplasm of soft palate: Secondary | ICD-10-CM | POA: Diagnosis not present

## 2015-05-24 ENCOUNTER — Other Ambulatory Visit (INDEPENDENT_AMBULATORY_CARE_PROVIDER_SITE_OTHER): Payer: Medicare Other

## 2015-05-24 DIAGNOSIS — E2740 Unspecified adrenocortical insufficiency: Secondary | ICD-10-CM | POA: Diagnosis not present

## 2015-05-24 DIAGNOSIS — E039 Hypothyroidism, unspecified: Secondary | ICD-10-CM | POA: Diagnosis not present

## 2015-05-24 LAB — COMPREHENSIVE METABOLIC PANEL
ALK PHOS: 114 U/L (ref 39–117)
ALT: 9 U/L (ref 0–53)
AST: 16 U/L (ref 0–37)
Albumin: 3.4 g/dL — ABNORMAL LOW (ref 3.5–5.2)
BUN: 8 mg/dL (ref 6–23)
CO2: 29 meq/L (ref 19–32)
Calcium: 8.9 mg/dL (ref 8.4–10.5)
Chloride: 102 mEq/L (ref 96–112)
Creatinine, Ser: 0.84 mg/dL (ref 0.40–1.50)
GFR: 118.8 mL/min (ref >60.00)
GLUCOSE: 100 mg/dL — AB (ref 70–99)
POTASSIUM: 3.1 meq/L — AB (ref 3.5–5.1)
SODIUM: 139 meq/L (ref 135–145)
TOTAL PROTEIN: 7.4 g/dL (ref 6.0–8.3)
Total Bilirubin: 1 mg/dL (ref 0.2–1.2)

## 2015-05-24 LAB — TSH: TSH: 0.73 u[IU]/mL (ref 0.35–4.50)

## 2015-05-24 LAB — T4, FREE: FREE T4: 1.2 ng/dL (ref 0.60–1.60)

## 2015-05-27 ENCOUNTER — Encounter: Payer: Self-pay | Admitting: Endocrinology

## 2015-05-27 ENCOUNTER — Ambulatory Visit (INDEPENDENT_AMBULATORY_CARE_PROVIDER_SITE_OTHER): Payer: Medicare Other | Admitting: Endocrinology

## 2015-05-27 VITALS — BP 148/94 | HR 88 | Temp 97.8°F | Resp 14 | Ht 73.0 in | Wt 126.6 lb

## 2015-05-27 DIAGNOSIS — E876 Hypokalemia: Secondary | ICD-10-CM

## 2015-05-27 DIAGNOSIS — E039 Hypothyroidism, unspecified: Secondary | ICD-10-CM

## 2015-05-27 DIAGNOSIS — E2740 Unspecified adrenocortical insufficiency: Secondary | ICD-10-CM

## 2015-05-27 MED ORDER — POTASSIUM CHLORIDE ER 20 MEQ PO TBCR: 8.0000 meq | EXTENDED_RELEASE_TABLET | Freq: Every day | ORAL | Status: DC

## 2015-05-27 NOTE — Progress Notes (Signed)
[name], male   DOB: 1950-11-23, 65 y.o.   MRN: [id number]            Reason for Appointment:   adrenal insufficiency, follow-up visit    History of Present Illness:   PROBLEM 1:    ADRENAL insufficiency:  His blood pressure apparently started getting low in April and his antihypertensive medications were progressively tapered off Cortisol level was low during his hospitalization in 5/16 Subsequently he continued to have problems with decreased appetite, low blood pressure and weight loss He has not had a Cortrosyn stimulation test done, since he was on hydrocortisone this was continued on increased to therapeutic levels  ACTH level when he was on low doses of hydrocortisone was relatively low at 2.9 indicating secondary adrenal insufficiency  Because of persistently low cortisol levels his PCP started him on hydrocortisone 15 mg a day in early June 2016 Subsequently continued to have problems with low blood pressure on standing up and feeling dizzy; also was having frequent near syncopal episodes and falling because of low blood pressure In 8/16 he was started on Florinef every other day and midodrine stopped.  RECENT history:  He was started back on Florinef in 1/17 when his standing blood pressure was only 84/56, now taking 1 tablet twice a week He has not felt lightheaded recently and does not feel any different Has normal appetite and no nausea. Consistent with taking 20 mg hydrocortisone in the morning and 10 mg the evening   PROBLEM 2: Hypothyroidism was first diagnosed in 2012  The following is a copy of the previous notes:   The symptoms consistent with hypothyroidism  had been: fatigue, feeling sleepy but no known history of cold sensitivity, difficulty concentrating His medications have been adjusted periodically but TSH has never been normal since 2012 Not clear the reason why his TSH was significantly high in 2013 and also 2015; patient is not a  good historian and difficult to communicate with him today His prescription history indicates that he has been taking anywhere between 150 up to 250 g of levothyroxine since about 2013 Earlier this year his TSH levels were in the hyperthyroid range and his dose of levothyroxine on his initial consultation was 162 g daily The patient  did complain of feeling palpitations and at times will get shaky.  RECENT history: Since the patient's free T4 level was markedly increased at 1.95 in 09/2014 he was told to reduce the dose to 112 g Subsequently his thyroid levels have been mostly near normal He has however lost weight recently for other reasons  Does not complain of any fatigue TSH level minimally higher on the last visit but now back to normal with no change in dosage  TSH levels have been as follows:  Lab Results  Component Value Date   TSH 0.73 05/24/2015   TSH 4.84* 03/23/2015   TSH 0.48 12/14/2014   TSH 0.18* 11/02/2014   TSH <0.008* 09/23/2014   TSH 0.078* 05/29/2014   TSH <0.010* 04/10/2014   TSH 99.937* 05/09/2013   TSH 15.352* 12/04/2012   TSH 84.299* 12/24/2011    More recent thyroid levels:         Lab Results  Component Value Date   TSH 0.73 05/24/2015   TSH 4.84* 03/23/2015   TSH 0.48 12/14/2014   FREET4 1.20 05/24/2015   FREET4 0.61 03/23/2015   FREET4 0.55* 12/14/2014      Wt Readings from Last 3 Encounters:  05/27/15 126  lb 9.6 oz (57.425 kg)  04/26/15 131 lb 9.6 oz (59.693 kg)  03/26/15 134 lb 3.2 oz (60.873 kg)    Lab Results  Component Value Date   CREATININE 0.84 05/24/2015   BUN 8 05/24/2015   NA 139 05/24/2015   K 3.1* 05/24/2015   CL 102 05/24/2015   CO2 29 05/24/2015      Past Medical History  Diagnosis Date  . Hypertension   . Cirrhosis of liver ([place])   . Hypothyroidism   . Alcohol abuse   . CHF (congestive heart failure) ([place])     EF 45-50% 05/2012  . Hyperlipidemia   . Arthritis   . Laryngeal cancer ([place]) 2011     Laryngectomy, T3N0M0  . Seizures [facility])     Past Surgical History  Procedure Laterality Date  . Tracheal surgery    . Tracheostomy    . Left and right heart catheterization with coronary angiogram N/A 12/28/2011    Procedure: LEFT AND RIGHT HEART CATHETERIZATION WITH CORONARY ANGIOGRAM;  Surgeon: Jolaine Artist, MD;  Location: [facility] CATH LAB;  Service: Cardiovascular;  Laterality: N/A;  . Esophagogastroduodenoscopy N/A 04/10/2014    Procedure: ESOPHAGOGASTRODUODENOSCOPY (EGD);  Surgeon: Missy Sabins, MD;  Location: [facility] ENDOSCOPY;  Service: Endoscopy;  Laterality: N/A;    Family History  Problem Relation Age of Onset  . Hyperlipidemia Mother   . Hypertension Mother   . Hypertension Father   . Hyperlipidemia Father     Social History:  reports that he has quit smoking. His smoking use included Cigarettes. He has a 10 pack-year smoking history. He has never used smokeless tobacco. He reports that he drinks alcohol. He reports that he does not use illicit drugs.  Allergies:  Allergies  Allergen Reactions  . Vicodin [Hydrocodone-Acetaminophen] Rash  . Other     Unknown anesthesia medicine caused cardiac arrest.  . Tylenol [Acetaminophen]     HBP -Liver Cirrhosis      Medication List       This list is accurate as of: 05/27/15 10:46 AM.  Always use your most recent med list.               amitriptyline 25 MG tablet  Commonly known as:  ELAVIL  Take 25 mg by mouth.     aspirin EC 81 MG tablet  Take 81 mg by mouth daily.     dronabinol 10 MG capsule  Commonly known as:  MARINOL  Take 10 mg by mouth 2 (two) times daily before a meal.     feeding supplement (JEVITY 1.5 CAL) Liqd  Place 70 mL/hr into feeding tube continuous.     fentaNYL 25 MCG/HR patch  Commonly known as:  DURAGESIC - dosed mcg/hr  Place 1 patch (25 mcg total) onto the skin every 3 (three) days.     fluconazole 200 MG tablet  Commonly known as:  DIFLUCAN  Take 200 mg by mouth daily.      fludrocortisone 0.1 MG tablet  Commonly known as:  FLORINEF  Take 1 tablet (0.1 mg total) by mouth daily.     free water Soln  Place 175 mLs into feeding tube 4 (four) times daily.     guaiFENesin 600 MG 12 hr tablet  Commonly known as:  MUCINEX  Take 1 tablet (600 mg total) by mouth 2 (two) times daily.     hydrocortisone 20 MG tablet  Commonly known as:  CORTEF  Take 1 tab bid     levothyroxine  112 MCG tablet  Commonly known as:  SYNTHROID, LEVOTHROID  TAKE 1 TABLET(112 MCG) BY MOUTH DAILY BEFORE BREAKFAST     lidocaine 2 % solution  Commonly known as:  XYLOCAINE  Use as directed 10 mLs in the mouth or throat 4 (four) times daily as needed for mouth pain.     magic mouthwash w/lidocaine Soln  Take 5 mLs by mouth 4 (four) times daily.     nebivolol 5 MG tablet  Commonly known as:  BYSTOLIC  Take 1 tablet (5 mg total) by mouth daily.     nystatin 100000 UNIT/ML suspension  Commonly known as:  MYCOSTATIN     ondansetron 4 MG tablet  Commonly known as:  ZOFRAN  Take 1 tablet (4 mg total) by mouth every 8 (eight) hours as needed for nausea or vomiting.     oxyCODONE 5 MG/5ML solution  Commonly known as:  ROXICODONE  Take 5 mLs (5 mg total) by mouth every 4 (four) hours as needed for severe pain.        ROS        He has had previous history of hypertension.  He was on 5 mg of bisoprolol, unable to get Bystolic covered by his insurance and has not gone back to bisoprolol His  blood pressure is significantly higher  WEIGHT loss: Etiology unclear, has not discussed with PCP  He continues to have problems with rapid heart rate although he does not complain of symptoms.  Cardiac follow-up still pending He was told to stop Elavil Previously   Examination:    BP 148/94 mmHg  Pulse 88  Temp(Src) 97.8 F (36.6 C)  Resp 14  Ht 6\' 1"  (1.854 m)  Wt 126 lb 9.6 oz (57.425 kg)  BMI 16.71 kg/m2  SpO2 98%  Standing blood pressure today on the Right side 130/95  No  ankle edema  Heart rate 108   Assessments   1.  Sinus tachycardia.  Etiology unclear, unrelated to thyroid.  He is asymptomatic  2.  Adrenal insufficiency, secondary:  This has been associated with orthostatic hypotension also indicating both mineralocorticoid and adrenal steroid deficiency.    Subjectively he is doing well with  hydrocortisone total 30 mg daily Sodium is normal Now his blood pressure has starting get low again He is not symptomatic at present   3.  Hypertension.  He has had marked variability in his blood pressure readings but blood pressure is unusually high today even with adding only small dose of Florinef  4.  Hypokalemia: This is new and not clear if this is vocational are related to low-dose Florinef   Treatment:    1. He will stop Florinef  2. Follow-up in 1 month 3. For evaluation of his tachycardia and blood pressure he will see the cardiologist again 4. No change in hydrocortisone or levothyroxine 5. Restart bisoprolol 5 mg daily, may need to increase the dose or add additional medications for hypertension.  He will need to allow up with cardiologist and PCP also 6. Kay Cl elixir 20 mg daily   Patient Instructions  Stop Fludrocortisone  Start Bisoprolol 5mg   Potassium daily     Burgandy Hackworth 05/27/2015, 10:46 AM

## 2015-05-27 NOTE — Patient Instructions (Addendum)
Stop Fludrocortisone  Start Bisoprolol 5mg   Potassium daily

## 2015-06-06 IMAGING — DX DG ABDOMEN 1V
1 series · 1 of 1 positions shown · non-contrast
Comparison: Abdominal CT 04/09/2014

CLINICAL DATA: G-tube placement confirmation

EXAM:
ABDOMEN - 1 VIEW

[abdomen kub]
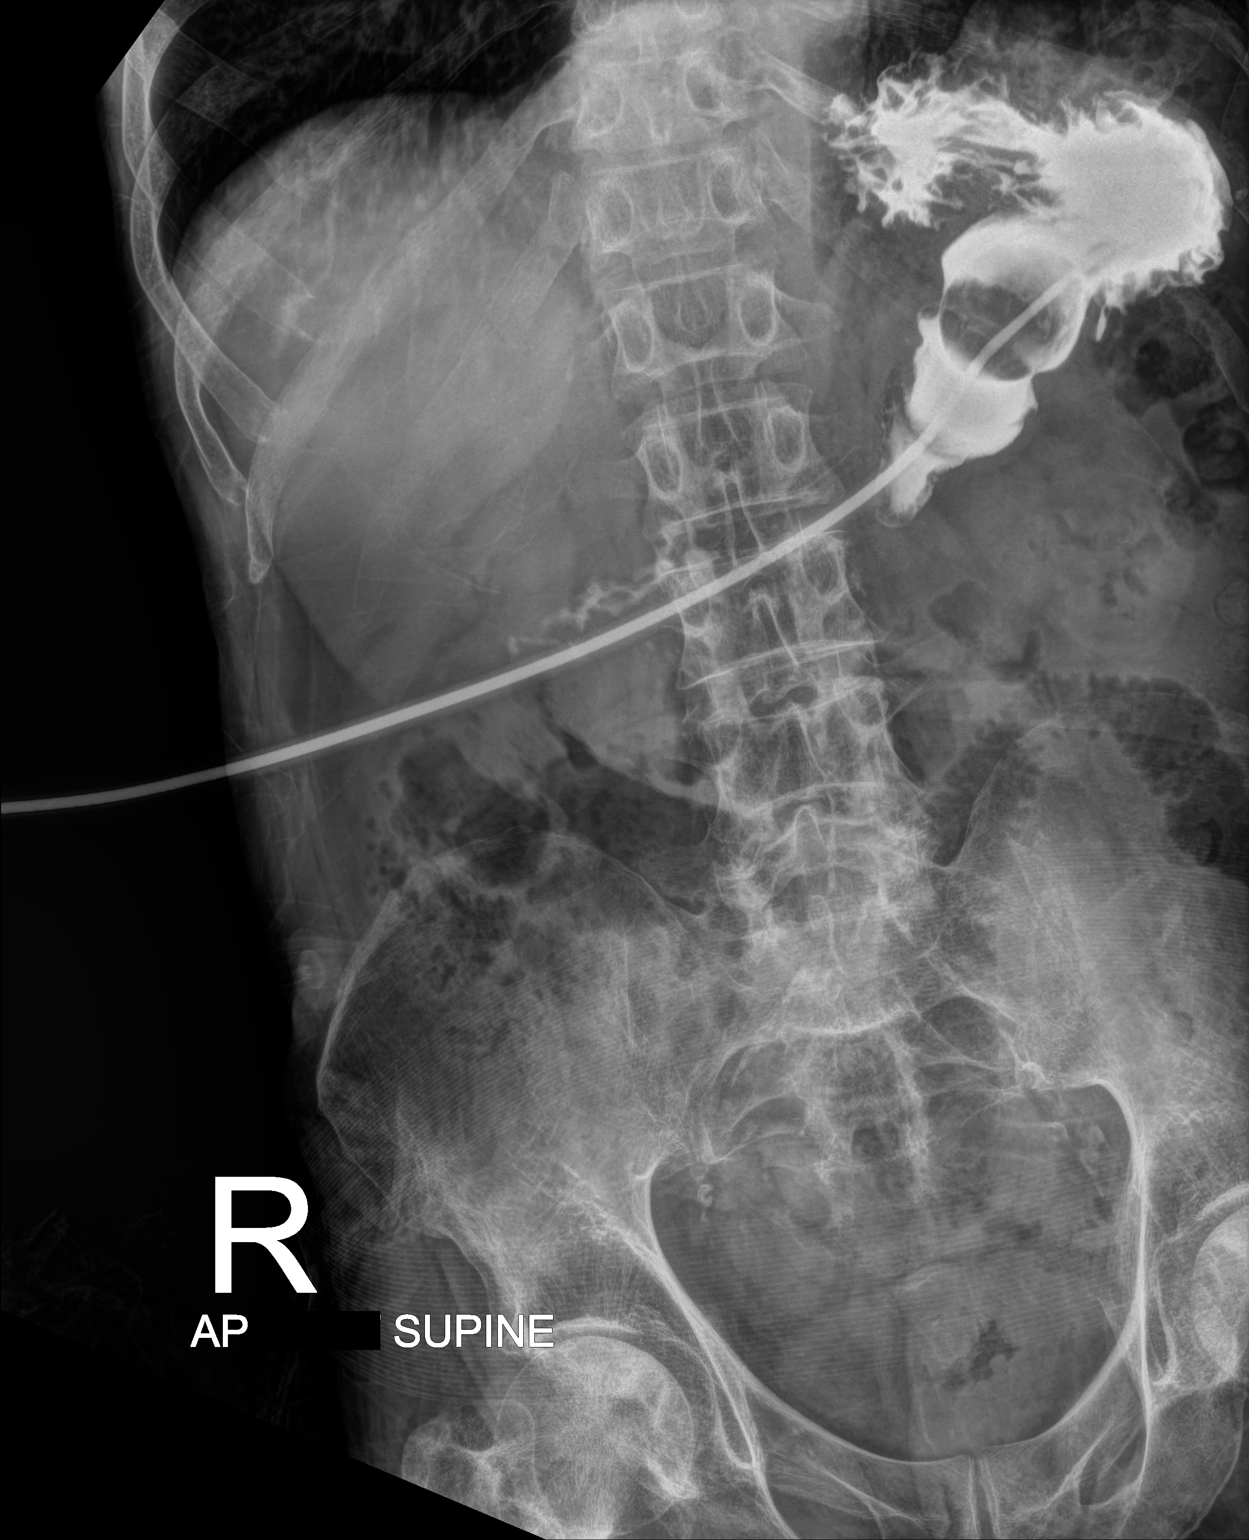

[1 of 1 positions shown; findings below may reference images not displayed]

FINDINGS: Percutaneous gastrostomy tube (likely a Foley catheter currently)
was injected and contrast outlines the proximal stomach and antrum
or proximal duodenum. There is no evidence for leakage.

The bowel gas pattern is nonobstructive.
IMPRESSION: Intraluminal percutaneous gastrostomy tube.

## 2015-06-21 ENCOUNTER — Other Ambulatory Visit (INDEPENDENT_AMBULATORY_CARE_PROVIDER_SITE_OTHER): Payer: Medicare Other

## 2015-06-21 DIAGNOSIS — E876 Hypokalemia: Secondary | ICD-10-CM | POA: Diagnosis not present

## 2015-06-21 LAB — BASIC METABOLIC PANEL
BUN: 18 mg/dL (ref 6–23)
CALCIUM: 9.5 mg/dL (ref 8.4–10.5)
CO2: 25 mEq/L (ref 19–32)
CREATININE: 1.07 mg/dL (ref 0.40–1.50)
Chloride: 95 mEq/L — ABNORMAL LOW (ref 96–112)
GFR: 89.83 mL/min (ref >60.00)
Glucose, Bld: 128 mg/dL — ABNORMAL HIGH (ref 70–99)
Potassium: 3.8 mEq/L (ref 3.5–5.1)
SODIUM: 133 meq/L — AB (ref 135–145)

## 2015-06-24 ENCOUNTER — Ambulatory Visit (INDEPENDENT_AMBULATORY_CARE_PROVIDER_SITE_OTHER): Payer: Medicare Other | Admitting: Endocrinology

## 2015-06-24 ENCOUNTER — Encounter: Payer: Self-pay | Admitting: Endocrinology

## 2015-06-24 VITALS — BP 98/68 | HR 90 | Temp 98.0°F | Resp 14 | Ht 71.0 in | Wt 121.6 lb

## 2015-06-24 DIAGNOSIS — E2740 Unspecified adrenocortical insufficiency: Secondary | ICD-10-CM

## 2015-06-24 DIAGNOSIS — E039 Hypothyroidism, unspecified: Secondary | ICD-10-CM | POA: Diagnosis not present

## 2015-06-24 DIAGNOSIS — I951 Orthostatic hypotension: Secondary | ICD-10-CM

## 2015-06-24 NOTE — Progress Notes (Signed)
[name], male   DOB: 1950/10/29, 65 y.o.   MRN: [id number]            Reason for Appointment: Blood pressure and  adrenal insufficiency, follow-up visit    History of Present Illness:   PROBLEM 1:    ADRENAL insufficiency:  His blood pressure apparently started getting low in April and his antihypertensive medications were progressively tapered off Cortisol level was low during his hospitalization in 5/16 Subsequently he continued to have problems with decreased appetite, low blood pressure and weight loss He has not had a Cortrosyn stimulation test done, since he was on hydrocortisone this was continued on increased to therapeutic levels  ACTH level when he was on low doses of hydrocortisone was relatively low at 2.9 indicating secondary adrenal insufficiency  Because of persistently low cortisol levels his PCP started him on hydrocortisone 15 mg a day in early June 2016 Subsequently continued to have problems with low blood pressure on standing up and feeling dizzy; also was having frequent near syncopal episodes and falling because of low blood pressure In 8/16 he was started on Florinef every other day and midodrine stopped.  RECENT history: Consistent with taking 20 mg hydrocortisone in the morning and 10 mg the evening Has normal appetite and no nausea.  Not clear why his weight has gone down  ORTHOSTATIC hypotension: He was started back on Florinef in 1/17 when his standing blood pressure was only 84/56 but on his last visit with taking 1 tablet twice a week his blood pressure went up significantly higher, was not taking any bisoprolol at this time  He says he has had 2 episodes where he felt lightheaded but did not fall and was able to feel better with sitting down   PROBLEM 2: Hypothyroidism was first diagnosed in 2012  The following is a copy of the previous notes:   The symptoms consistent with hypothyroidism  had been: fatigue, feeling sleepy but  no known history of cold sensitivity, difficulty concentrating His medications have been adjusted periodically but TSH has never been normal since 2012 Not clear the reason why his TSH was significantly high in 2013 and also 2015; patient is not a good historian and difficult to communicate with him today His prescription history indicates that he has been taking anywhere between 150 up to 250 g of levothyroxine since about 2013 Earlier this year his TSH levels were in the hyperthyroid range and his dose of levothyroxine on his initial consultation was 162 g daily The patient  did complain of feeling palpitations and at times will get shaky.  RECENT history: Since the patient's free T4 level was markedly increased at 1.95 in 09/2014 he was told to reduce the dose to 112 g Subsequently his thyroid levels have been mostly near normal He has however lost weight recently for other reasons  Does not complain of any fatigue TSH level minimally higher on the last visit but now back to normal with no change in dosage  TSH levels have been as follows:  Lab Results  Component Value Date   TSH 0.73 05/24/2015   TSH 4.84* 03/23/2015   TSH 0.48 12/14/2014   TSH 0.18* 11/02/2014   TSH <0.008* 09/23/2014   TSH 0.078* 05/29/2014   TSH <0.010* 04/10/2014   TSH 99.937* 05/09/2013   TSH 15.352* 12/04/2012   TSH 84.299* 12/24/2011    More recent thyroid levels:         Lab Results  Component  Value Date   TSH 0.73 05/24/2015   TSH 4.84* 03/23/2015   TSH 0.48 12/14/2014   FREET4 1.20 05/24/2015   FREET4 0.61 03/23/2015   FREET4 0.55* 12/14/2014      Wt Readings from Last 3 Encounters:  06/24/15 121 lb 9.6 oz (55.157 kg)  05/27/15 126 lb 9.6 oz (57.425 kg)  04/26/15 131 lb 9.6 oz (59.693 kg)    Lab Results  Component Value Date   CREATININE 1.07 06/21/2015   BUN 18 06/21/2015   NA 133* 06/21/2015   K 3.8 06/21/2015   CL 95* 06/21/2015   CO2 25 06/21/2015      Past Medical  History  Diagnosis Date  . Hypertension   . Cirrhosis of liver ([place])   . Hypothyroidism   . Alcohol abuse   . CHF (congestive heart failure) ([place])     EF 45-50% 05/2012  . Hyperlipidemia   . Arthritis   . Laryngeal cancer ([place]) 2011    Laryngectomy, T3N0M0  . Seizures [facility])     Past Surgical History  Procedure Laterality Date  . Tracheal surgery    . Tracheostomy    . Left and right heart catheterization with coronary angiogram N/A 12/28/2011    Procedure: LEFT AND RIGHT HEART CATHETERIZATION WITH CORONARY ANGIOGRAM;  Surgeon: Jolaine Artist, MD;  Location: [facility] CATH LAB;  Service: Cardiovascular;  Laterality: N/A;  . Esophagogastroduodenoscopy N/A 04/10/2014    Procedure: ESOPHAGOGASTRODUODENOSCOPY (EGD);  Surgeon: Missy Sabins, MD;  Location: [facility] ENDOSCOPY;  Service: Endoscopy;  Laterality: N/A;    Family History  Problem Relation Age of Onset  . Hyperlipidemia Mother   . Hypertension Mother   . Hypertension Father   . Hyperlipidemia Father     Social History:  reports that he has quit smoking. His smoking use included Cigarettes. He has a 10 pack-year smoking history. He has never used smokeless tobacco. He reports that he drinks alcohol. He reports that he does not use illicit drugs.  Allergies:  Allergies  Allergen Reactions  . Vicodin [Hydrocodone-Acetaminophen] Rash  . Other     Unknown anesthesia medicine caused cardiac arrest.  . Tylenol [Acetaminophen]     HBP -Liver Cirrhosis      Medication List       This list is accurate as of: 06/24/15 10:55 AM.  Always use your most recent med list.               aspirin EC 81 MG tablet  Take 81 mg by mouth daily.     bisoprolol 5 MG tablet  Commonly known as:  ZEBETA  Take 5 mg by mouth daily.     dronabinol 10 MG capsule  Commonly known as:  MARINOL  Take 10 mg by mouth 2 (two) times daily before a meal.     feeding supplement (JEVITY 1.5 CAL) Liqd  Place 70 mL/hr into feeding tube continuous.      fentaNYL 25 MCG/HR patch  Commonly known as:  DURAGESIC - dosed mcg/hr  Place 1 patch (25 mcg total) onto the skin every 3 (three) days.     fluconazole 200 MG tablet  Commonly known as:  DIFLUCAN  Take 200 mg by mouth daily.     free water Soln  Place 175 mLs into feeding tube 4 (four) times daily.     guaiFENesin 600 MG 12 hr tablet  Commonly known as:  MUCINEX  Take 1 tablet (600 mg total) by mouth 2 (two) times daily.  hydrocortisone 20 MG tablet  Commonly known as:  CORTEF  Take 1 tab bid     levothyroxine 112 MCG tablet  Commonly known as:  SYNTHROID, LEVOTHROID  TAKE 1 TABLET(112 MCG) BY MOUTH DAILY BEFORE BREAKFAST     lidocaine 2 % solution  Commonly known as:  XYLOCAINE  Use as directed 10 mLs in the mouth or throat 4 (four) times daily as needed for mouth pain.     magic mouthwash w/lidocaine Soln  Take 5 mLs by mouth 4 (four) times daily.     nystatin 100000 UNIT/ML suspension  Commonly known as:  MYCOSTATIN     ondansetron 4 MG tablet  Commonly known as:  ZOFRAN  Take 1 tablet (4 mg total) by mouth every 8 (eight) hours as needed for nausea or vomiting.     oxyCODONE 5 MG/5ML solution  Commonly known as:  ROXICODONE  Take 5 mLs (5 mg total) by mouth every 4 (four) hours as needed for severe pain.     Potassium Chloride ER 20 MEQ Tbcr  Take 8 mEq by mouth daily.        ROS        He has had previous history of hypertension and sinus tachycardia.  He was Started back on 5 mg of bisoprolol, unable to get Bystolic covered by his insurance   WEIGHT loss: Etiology unclear, has not discussed with PCP  He continues to have problems with rapid heart rate although he does not complain of symptoms.  Cardiac follow-up still pending   Examination:    BP 98/68 mmHg  Pulse 90  Temp(Src) 98 F (36.7 C)  Resp 14  Ht 5\' 11"  (1.803 m)  Wt 121 lb 9.6 oz (55.157 kg)  BMI 16.97 kg/m2  SpO2 99%  Standing blood pressure today on the Left side 80/68 and Right  side immediately after standing 100/70  No ankle edema   Assessments   1.  Orthostatic hypotension: This appears to be a recurrent problem and recently appears to have some symptoms also.  Most likely this is related to adrenal insufficiency but not clear if he has some autonomic neuropathy also.  His blood pressure tends to go up significantly with taking Florinef even twice a week and he previously did not have any benefit from midodrine  2.  Adrenal insufficiency, secondary: Not clear why his weight is gone down, he says his appetite is fairly good. Has been compliant with medication and he has no nausea or fatigue   3.  Hypertension.  He has had marked variability in his blood pressure readings but blood pressure is down significantly only with restarting bisoprolol and stopping Florinef low-dose  4.  Hypokalemia: This is better with potassium supplement   Treatment:    1. He will restart twice a week Florinef.  His daughter will start checking his blood pressure at home and call if out of range with was discussed 2. Continue bisoprolol unchanged  3. Continue potassium supplement 4. Follow-up in 1 month 5. For evaluation of his tachycardia and blood pressure he will see the cardiologist again 6. No change in hydrocortisone or levothyroxine    Patient Instructions  Take Florinef twice a week  Check Bp at least 3 x week  Bp range 110-140 top and 70-85 low     Theodoro Koval 06/24/2015, 10:55 AM   Note: This office note was prepared with Estate agent. Any transcriptional errors that result from this process are unintentional.

## 2015-06-24 NOTE — Patient Instructions (Addendum)
Take Florinef twice a week  Check Bp at least 3 x week  Bp range 110-140 top and 70-85 low

## 2015-07-02 ENCOUNTER — Other Ambulatory Visit: Payer: Self-pay | Admitting: Endocrinology

## 2015-07-19 ENCOUNTER — Other Ambulatory Visit: Payer: Medicare Other

## 2015-07-22 ENCOUNTER — Encounter: Payer: Self-pay | Admitting: Endocrinology

## 2015-07-22 ENCOUNTER — Ambulatory Visit (INDEPENDENT_AMBULATORY_CARE_PROVIDER_SITE_OTHER): Payer: Medicare Other | Admitting: Endocrinology

## 2015-07-22 VITALS — BP 106/60 | HR 87 | Ht 71.0 in | Wt 120.0 lb

## 2015-07-22 DIAGNOSIS — E039 Hypothyroidism, unspecified: Secondary | ICD-10-CM | POA: Diagnosis not present

## 2015-07-22 DIAGNOSIS — E2740 Unspecified adrenocortical insufficiency: Secondary | ICD-10-CM | POA: Diagnosis not present

## 2015-07-22 DIAGNOSIS — I951 Orthostatic hypotension: Secondary | ICD-10-CM | POA: Diagnosis not present

## 2015-07-22 LAB — BASIC METABOLIC PANEL
BUN: 10 mg/dL (ref 6–23)
CALCIUM: 9.5 mg/dL (ref 8.4–10.5)
CO2: 24 mEq/L (ref 19–32)
Chloride: 103 mEq/L (ref 96–112)
Creatinine, Ser: 1.07 mg/dL (ref 0.40–1.50)
GFR: 89.8 mL/min (ref >60.00)
Glucose, Bld: 99 mg/dL (ref 70–99)
POTASSIUM: 4.2 meq/L (ref 3.5–5.1)
SODIUM: 137 meq/L (ref 135–145)

## 2015-07-22 LAB — TSH: TSH: 1.4 u[IU]/mL (ref 0.35–4.50)

## 2015-07-22 MED ORDER — FLUDROCORTISONE ACETATE 0.1 MG PO TABS: ORAL_TABLET | ORAL | Status: DC

## 2015-07-22 NOTE — Patient Instructions (Signed)
Take Fludrocortisone on Mon/Wed? Fridays

## 2015-07-22 NOTE — Progress Notes (Signed)
[name], male   DOB: 12-Feb-1951, 65 y.o.   MRN: [id number]            Reason for Appointment: Blood pressure and  adrenal insufficiency, follow-up visit    History of Present Illness:   PROBLEM 1:   ORTHOSTATIC hypotension: He was started back on Florinef in 1/17 when his standing blood pressure was only 84/56 but subsequently with taking 1 tablet twice a week his blood pressure went up significantly higher, was not taking any bisoprolol at this time He was started back on Florinef twice a week in 4/17 because of a standing blood pressure as low as 80/68 and occasional episodes of lightheadedness  He says he has had only one episode of lightheadedness yesterday since his last visit Feels better today    ADRENAL insufficiency:  His blood pressure apparently started getting low in April and his antihypertensive medications were progressively tapered off Cortisol level was low during his hospitalization in 5/16 Subsequently he continued to have problems with decreased appetite, low blood pressure and weight loss He has not had a Cortrosyn stimulation test done, since he was on hydrocortisone this was continued on increased to therapeutic levels  ACTH level when he was on low doses of hydrocortisone was relatively low at 2.9 indicating secondary adrenal insufficiency  Because of persistently low cortisol levels his PCP started him on hydrocortisone 15 mg a day in early June 2016 Subsequently continued to have problems with low blood pressure on standing up and feeling dizzy; also was having frequent near syncopal episodes and falling because of low blood pressure In 8/16 he was started on Florinef every other day and midodrine stopped.  RECENT history: Consistent with taking 20 mg hydrocortisone in the morning and 10 mg the evening Has normal appetite  Weight has stabilized   Wt Readings from Last 3 Encounters:  07/22/15 120 lb (54.432 kg)  06/24/15 121 lb 9.6 oz  (55.157 kg)  05/27/15 126 lb 9.6 oz (57.425 kg)    PROBLEM 2:  Hypothyroidism was first diagnosed in 2012  The following is a copy of the previous notes:   The symptoms consistent with hypothyroidism  had been: fatigue, feeling sleepy but no known history of cold sensitivity, difficulty concentrating His medications have been adjusted periodically but TSH has never been normal since 2012 Not clear the reason why his TSH was significantly high in 2013 and also 2015; patient is not a good historian and difficult to communicate with him today His prescription history indicates that he has been taking anywhere between 150 up to 250 g of levothyroxine since about 2013 Earlier this year his TSH levels were in the hyperthyroid range and his dose of levothyroxine on his initial consultation was 162 g daily The patient  did complain of feeling palpitations and at times will get shaky.  RECENT history: Since the patient's free T4 level was markedly increased at 1.95 in 09/2014 he was told to reduce the dose to 112 g Subsequently his thyroid levels have been mostly near normal He has however lost weight recently for other reasons  Does not complain of any fatigue TSH level minimally higher on the last visit but now back to normal with no change in dosage  TSH levels have been as follows:  Lab Results  Component Value Date   TSH 0.73 05/24/2015   TSH 4.84* 03/23/2015   TSH 0.48 12/14/2014   TSH 0.18* 11/02/2014   TSH <0.008* 09/23/2014  TSH 0.078* 05/29/2014   TSH <0.010* 04/10/2014   TSH 99.937* 05/09/2013   TSH 15.352* 12/04/2012   TSH 84.299* 12/24/2011    More recent thyroid levels:         Lab Results  Component Value Date   TSH 0.73 05/24/2015   TSH 4.84* 03/23/2015   TSH 0.48 12/14/2014   FREET4 1.20 05/24/2015   FREET4 0.61 03/23/2015   FREET4 0.55* 12/14/2014      Wt Readings from Last 3 Encounters:  07/22/15 120 lb (54.432 kg)  06/24/15 121 lb 9.6 oz (55.157  kg)  05/27/15 126 lb 9.6 oz (57.425 kg)    Lab Results  Component Value Date   CREATININE 1.07 06/21/2015   BUN 18 06/21/2015   NA 133* 06/21/2015   K 3.8 06/21/2015   CL 95* 06/21/2015   CO2 25 06/21/2015      Past Medical History  Diagnosis Date  . Hypertension   . Cirrhosis of liver ([place])   . Hypothyroidism   . Alcohol abuse   . CHF (congestive heart failure) ([place])     EF 45-50% 05/2012  . Hyperlipidemia   . Arthritis   . Laryngeal cancer ([place]) 2011    Laryngectomy, T3N0M0  . Seizures [facility])     Past Surgical History  Procedure Laterality Date  . Tracheal surgery    . Tracheostomy    . Left and right heart catheterization with coronary angiogram N/A 12/28/2011    Procedure: LEFT AND RIGHT HEART CATHETERIZATION WITH CORONARY ANGIOGRAM;  Surgeon: Jolaine Artist, MD;  Location: [facility] CATH LAB;  Service: Cardiovascular;  Laterality: N/A;  . Esophagogastroduodenoscopy N/A 04/10/2014    Procedure: ESOPHAGOGASTRODUODENOSCOPY (EGD);  Surgeon: Missy Sabins, MD;  Location[facility] ENDOSCOPY;  Service: Endoscopy;  Laterality: N/A;    Family History  Problem Relation Age of Onset  . Hyperlipidemia Mother   . Hypertension Mother   . Hypertension Father   . Hyperlipidemia Father     Social History:  reports that he has quit smoking. His smoking use included Cigarettes. He has a 10 pack-year smoking history. He has never used smokeless tobacco. He reports that he drinks alcohol. He reports that he does not use illicit drugs.  Allergies:  Allergies  Allergen Reactions  . Vicodin [Hydrocodone-Acetaminophen] Rash  . Other     Unknown anesthesia medicine caused cardiac arrest.  . Tylenol [Acetaminophen]     HBP -Liver Cirrhosis      Medication List       This list is accurate as of: 07/22/15  5:08 PM.  Always use your most recent med list.               aspirin EC 81 MG tablet  Take 81 mg by mouth daily.     bisoprolol 5 MG tablet  Commonly known as:  ZEBETA  Take  5 mg by mouth daily.     dronabinol 10 MG capsule  Commonly known as:  MARINOL  Take 10 mg by mouth 2 (two) times daily before a meal.     feeding supplement (JEVITY 1.5 CAL) Liqd  Place 70 mL/hr into feeding tube continuous.     fentaNYL 25 MCG/HR patch  Commonly known as:  DURAGESIC - dosed mcg/hr  Place 1 patch (25 mcg total) onto the skin every 3 (three) days.     fluconazole 200 MG tablet  Commonly known as:  DIFLUCAN  Take 200 mg by mouth daily.     fludrocortisone 0.1 MG  tablet  Commonly known as:  FLORINEF  Tae 3 times a week     free water Soln  Place 175 mLs into feeding tube 4 (four) times daily.     guaiFENesin 600 MG 12 hr tablet  Commonly known as:  MUCINEX  Take 1 tablet (600 mg total) by mouth 2 (two) times daily.     hydrocortisone 20 MG tablet  Commonly known as:  CORTEF  TAKE 1 TABLET BY MOUTH TWICE DAILY     levothyroxine 112 MCG tablet  Commonly known as:  SYNTHROID, LEVOTHROID  TAKE 1 TABLET(112 MCG) BY MOUTH DAILY BEFORE BREAKFAST     lidocaine 2 % solution  Commonly known as:  XYLOCAINE  Use as directed 10 mLs in the mouth or throat 4 (four) times daily as needed for mouth pain.     magic mouthwash w/lidocaine Soln  Take 5 mLs by mouth 4 (four) times daily.     nystatin 100000 UNIT/ML suspension  Commonly known as:  MYCOSTATIN     ondansetron 4 MG tablet  Commonly known as:  ZOFRAN  Take 1 tablet (4 mg total) by mouth every 8 (eight) hours as needed for nausea or vomiting.     oxyCODONE 5 MG/5ML solution  Commonly known as:  ROXICODONE  Take 5 mLs (5 mg total) by mouth every 4 (four) hours as needed for severe pain.     Potassium Chloride ER 20 MEQ Tbcr  Take 8 mEq by mouth daily.        ROS        He has had previous history of hypertension and sinus tachycardia.   Started back on 5 mg of bisoprolol, unable to get Bystolic covered by his insurance   He continues to have problems with relative rapid heart rate although he does  not complain of symptoms.  Cardiac follow-up still pending   Examination:    BP 106/60 mmHg  Pulse 87  Ht 5\' 11"  (1.803 m)  Wt 120 lb (54.432 kg)  BMI 16.74 kg/m2  SpO2 94%  Standing blood pressure today 106/60  No ankle edema   Assessments   1.  Orthostatic hypotension: This appears to be improving now with starting low-dose Florinef, previously a higher dose had caused increased blood pressure. He has had only one episode of dizziness yesterday which was the day he took his Florinef No edema with this and his blood pressure is still controlled with bisoprolol Note that he previously did not have any benefit from midodrine  2.  Adrenal insufficiency, secondary: Not clear why his weight is gone down, he says his appetite is fairly good. Has been compliant with medication and he has no nausea or fatigue   3.  Hypertension.  He  has fairly good blood pressure today  4.  Hypokalemia: Needs follow-up   Treatment:    1. He will use Florinef 3 times a week 2. Check labs today 3. Continue bisoprolol unchanged  4. Continue potassium supplement 5. Follow-up in 1.5 month 6. No change in hydrocortisone 7. Will adjust his Synthroid based on thyroid levels today    Patient Instructions  Take Fludrocortisone on Mon/Wed? Fridays     [facility] 07/22/2015, 5:08 PM   Note: This office note was prepared with Dragon voice recognition system technology. Any transcriptional errors that result from this process are unintentional.

## 2015-07-23 LAB — T4, FREE: FREE T4: 0.92 ng/dL (ref 0.60–1.60)

## 2015-07-23 NOTE — Progress Notes (Signed)
Quick Note:  Please let his daughter know that the lab results are normal and no change in treatment needed ______

## 2015-07-28 ENCOUNTER — Other Ambulatory Visit: Payer: Self-pay | Admitting: Endocrinology

## 2015-08-04 DIAGNOSIS — Z9002 Acquired absence of larynx: Secondary | ICD-10-CM | POA: Diagnosis not present

## 2015-08-04 DIAGNOSIS — S82842A Displaced bimalleolar fracture of left lower leg, initial encounter for closed fracture: Secondary | ICD-10-CM | POA: Diagnosis not present

## 2015-08-04 DIAGNOSIS — M25572 Pain in left ankle and joints of left foot: Secondary | ICD-10-CM | POA: Diagnosis not present

## 2015-08-04 DIAGNOSIS — C102 Malignant neoplasm of lateral wall of oropharynx: Secondary | ICD-10-CM | POA: Diagnosis not present

## 2015-08-04 DIAGNOSIS — C321 Malignant neoplasm of supraglottis: Secondary | ICD-10-CM | POA: Diagnosis not present

## 2015-08-04 DIAGNOSIS — Z681 Body mass index (BMI) 19 or less, adult: Secondary | ICD-10-CM | POA: Diagnosis not present

## 2015-08-04 DIAGNOSIS — Z931 Gastrostomy status: Secondary | ICD-10-CM | POA: Diagnosis not present

## 2015-08-04 DIAGNOSIS — S8262XA Displaced fracture of lateral malleolus of left fibula, initial encounter for closed fracture: Secondary | ICD-10-CM | POA: Diagnosis not present

## 2015-08-04 DIAGNOSIS — Z9889 Other specified postprocedural states: Secondary | ICD-10-CM | POA: Diagnosis not present

## 2015-08-04 DIAGNOSIS — S82892A Other fracture of left lower leg, initial encounter for closed fracture: Secondary | ICD-10-CM | POA: Diagnosis not present

## 2015-08-04 DIAGNOSIS — R918 Other nonspecific abnormal finding of lung field: Secondary | ICD-10-CM | POA: Diagnosis not present

## 2015-08-06 DIAGNOSIS — E039 Hypothyroidism, unspecified: Secondary | ICD-10-CM | POA: Diagnosis not present

## 2015-08-06 DIAGNOSIS — Z7982 Long term (current) use of aspirin: Secondary | ICD-10-CM | POA: Diagnosis not present

## 2015-08-06 DIAGNOSIS — Z886 Allergy status to analgesic agent status: Secondary | ICD-10-CM | POA: Diagnosis not present

## 2015-08-06 DIAGNOSIS — Z79899 Other long term (current) drug therapy: Secondary | ICD-10-CM | POA: Diagnosis not present

## 2015-08-06 DIAGNOSIS — Z87891 Personal history of nicotine dependence: Secondary | ICD-10-CM | POA: Diagnosis not present

## 2015-08-06 DIAGNOSIS — J449 Chronic obstructive pulmonary disease, unspecified: Secondary | ICD-10-CM | POA: Diagnosis not present

## 2015-08-06 DIAGNOSIS — S82892D Other fracture of left lower leg, subsequent encounter for closed fracture with routine healing: Secondary | ICD-10-CM | POA: Diagnosis not present

## 2015-08-06 DIAGNOSIS — I509 Heart failure, unspecified: Secondary | ICD-10-CM | POA: Diagnosis not present

## 2015-08-06 DIAGNOSIS — Z930 Tracheostomy status: Secondary | ICD-10-CM | POA: Diagnosis not present

## 2015-08-06 DIAGNOSIS — I110 Hypertensive heart disease with heart failure: Secondary | ICD-10-CM | POA: Diagnosis not present

## 2015-08-06 DIAGNOSIS — Z885 Allergy status to narcotic agent status: Secondary | ICD-10-CM | POA: Diagnosis not present

## 2015-08-24 IMAGING — CR DG ABDOMEN 2V
2 series · 2 of 2 positions shown · non-contrast
Comparison: 05/10/2014

CLINICAL DATA: Lower abdominal pain 2 weeks. Gastrostomy tube
placement for 1 year.

EXAM:
ABDOMEN - 2 VIEW

[w abdomen upright *]
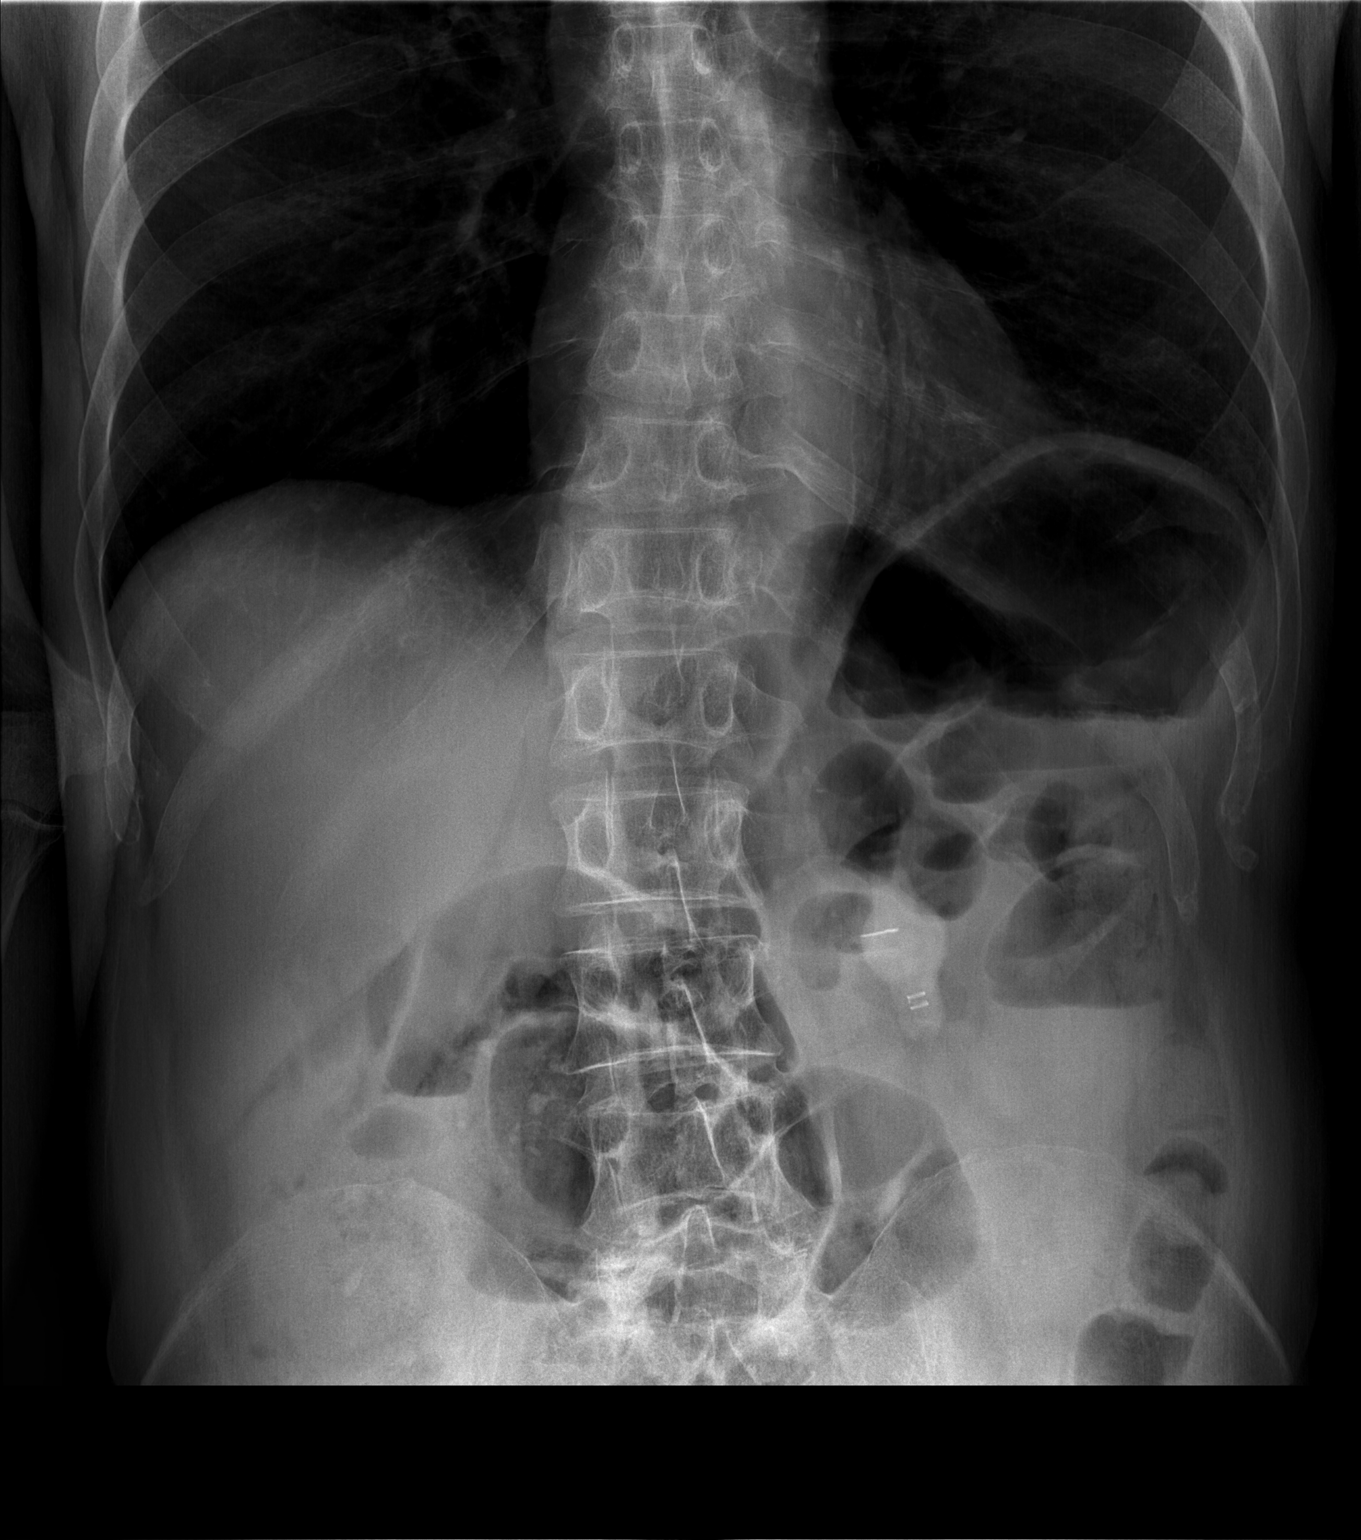

[t abdomen supine]
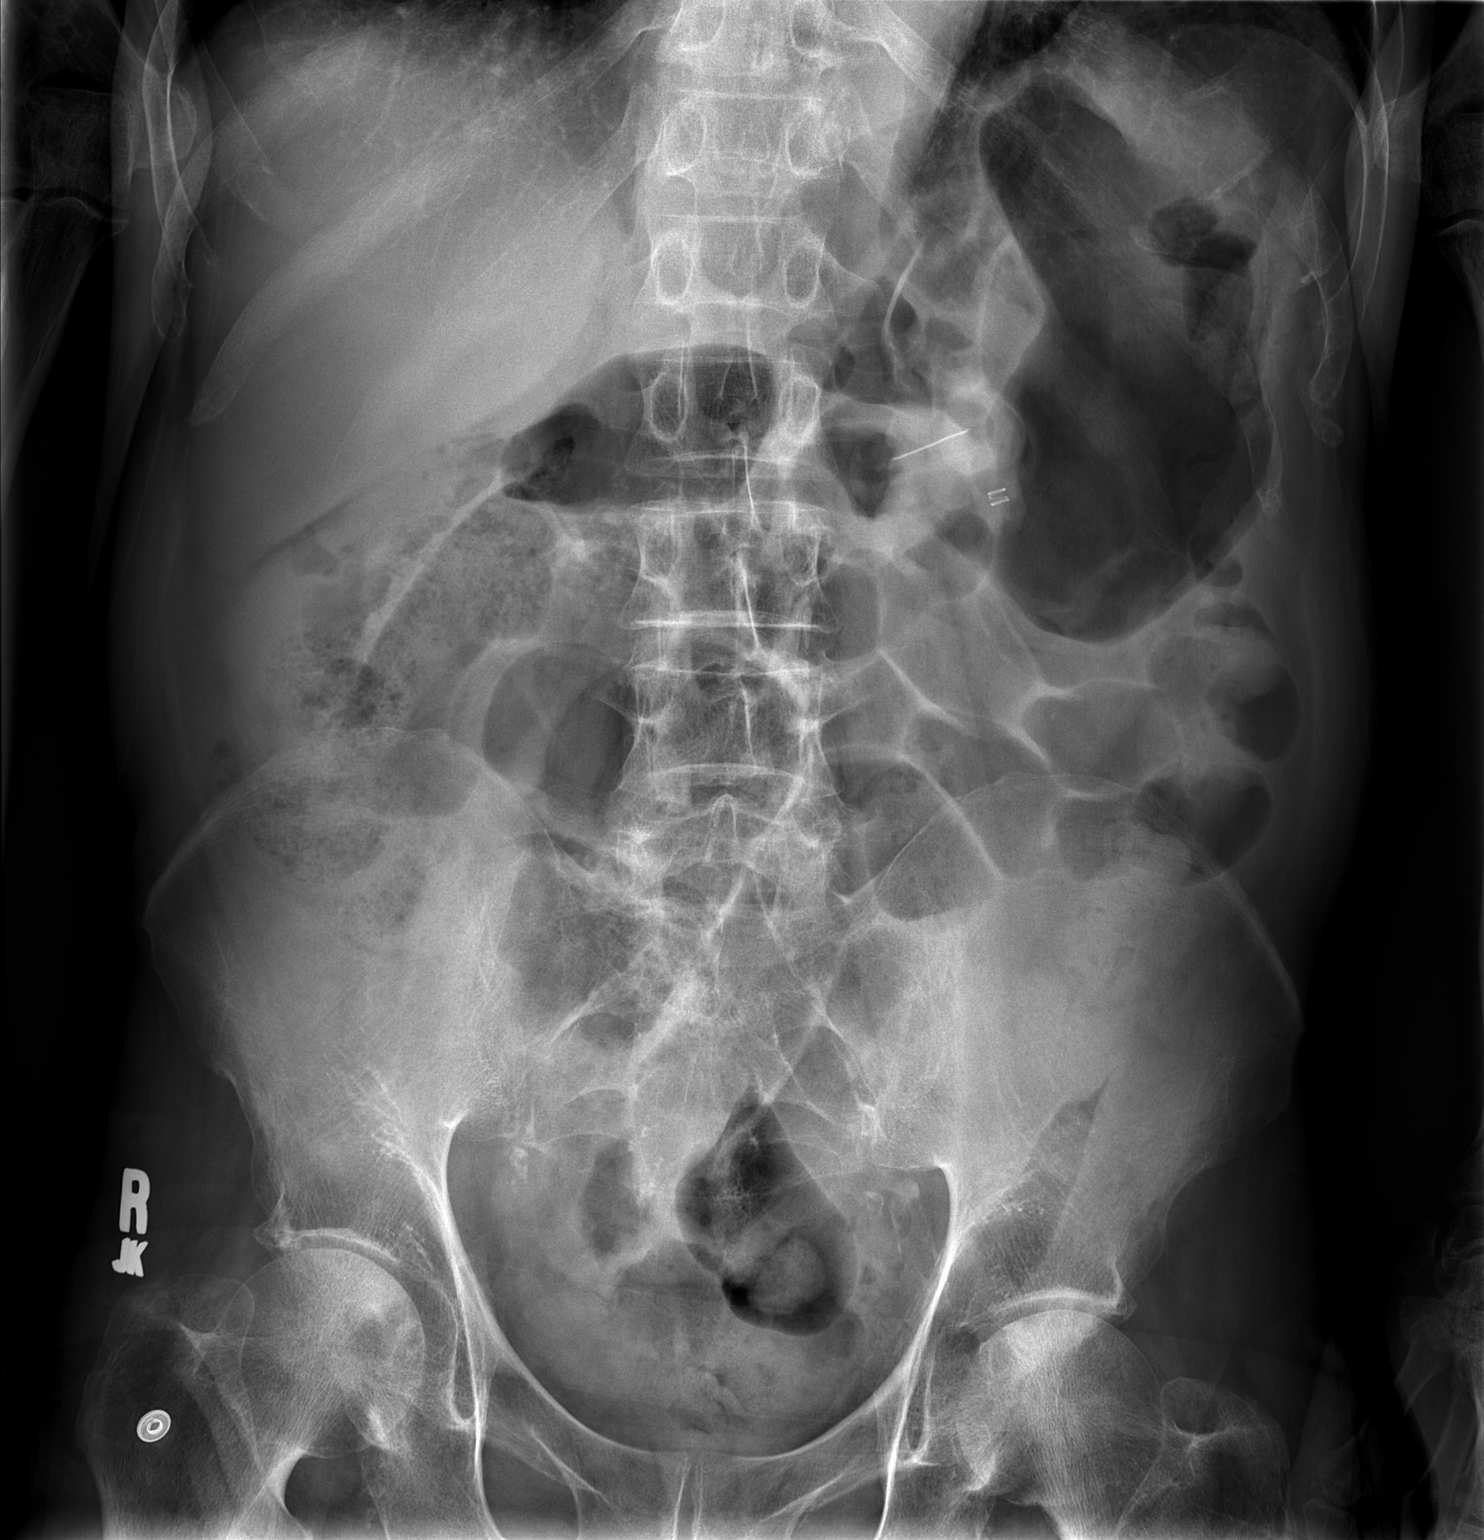

[2 of 2 positions shown; findings below may reference images not displayed]

FINDINGS: Bowel gas pattern demonstrates air within the colon and small bowel.
There is a suggestion of a few air-filled mildly dilated small bowel
loops. There are a few nonspecific scattered air-fluid levels. No
free peritoneal air. Evidence of patient's gastrostomy tube
projected over the stomach in the left upper quadrant.

Minimal patchy sclerosis over the femoral heads compatible known
avascular necrosis.
IMPRESSION: Several air-filled minimally dilated small bowel loops with a few
scattered air-fluid levels. Findings may be due to ileus versus
early/partial small bowel obstruction. Recommend follow-up serial
abdominal films versus CT.

## 2015-08-26 IMAGING — CT CT ABD-PELV W/ CM
2 of 5 series · 15 of 46 positions shown, 17 images · IV contrast (Omni 300)
Comparison: Abdominal radiographs 07/28/2014 and abdominal CT
04/09/2014

CLINICAL DATA: Abdominal pain near the umbilicus. Concern for small
bowel obstruction or ileus. History of laryngeal cancer.

EXAM:
CT ABDOMEN AND PELVIS WITH CONTRAST
TECHNIQUE: Multidetector CT imaging of the abdomen and pelvis was performed
using the standard protocol following bolus administration of
intravenous contrast.
CONTRAST:  80mL OMNIPAQUE IOHEXOL 300 MG/ML  SOLN

[Series 2: abd/ pelvis 5.0 i30f 1 · axial · 0.75mm/px · z∈[+1008,+1433]mm · 12 of 96 slices shown, 14 images]
[im 6/96  soft-tissue]
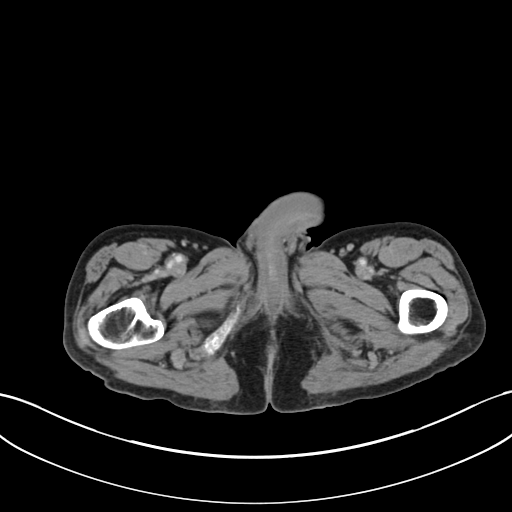
[im 6/96  bone]
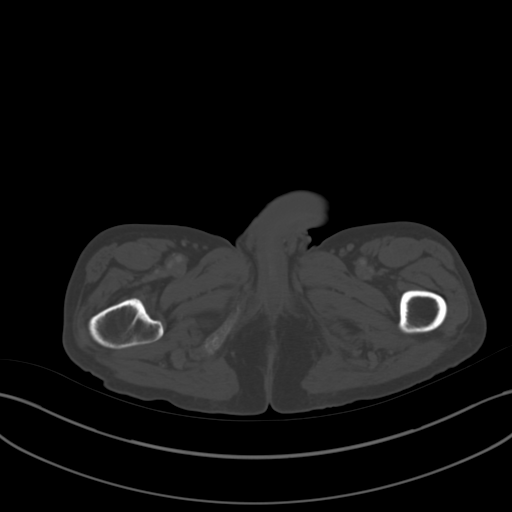
[im 16/96  soft-tissue]
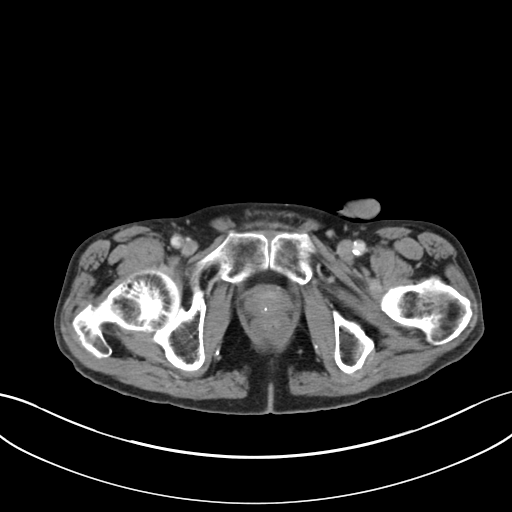
[im 21/96  soft-tissue]
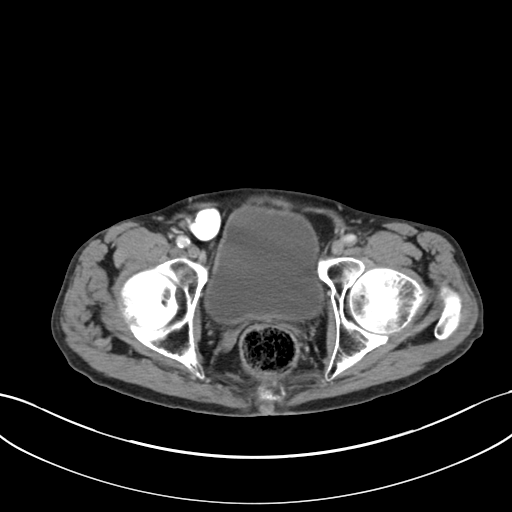
[im 31/96  soft-tissue]
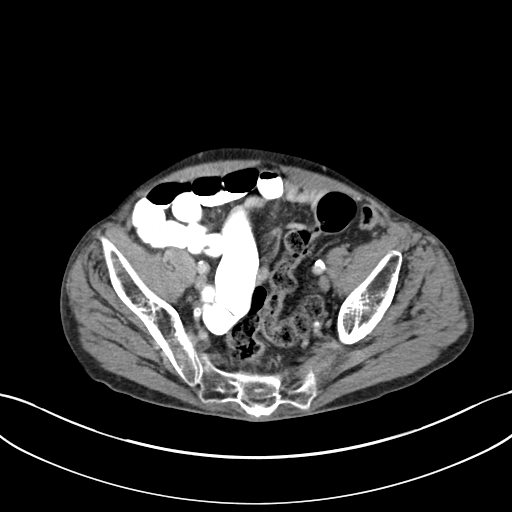
[im 36/96  soft-tissue]
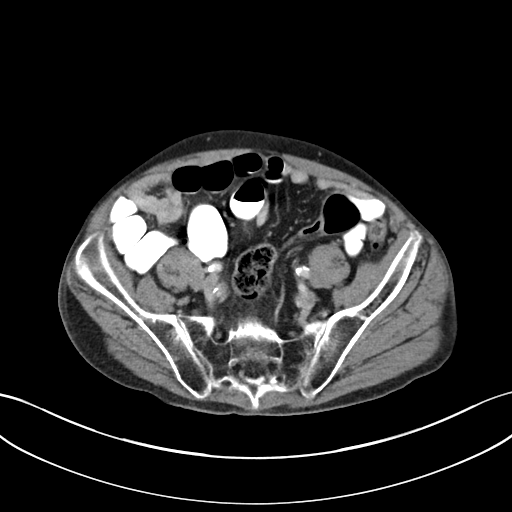
[im 46/96  soft-tissue]
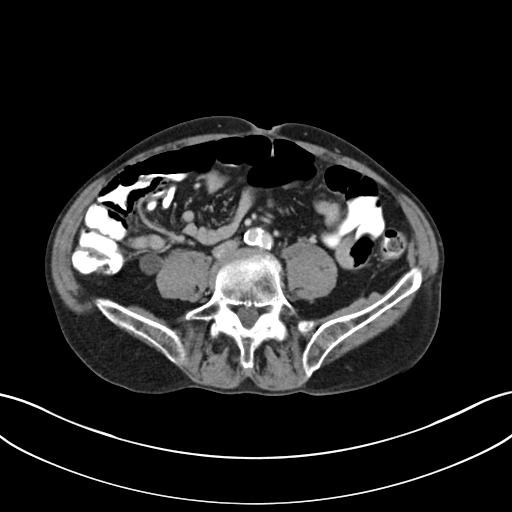
[im 51/96  soft-tissue]
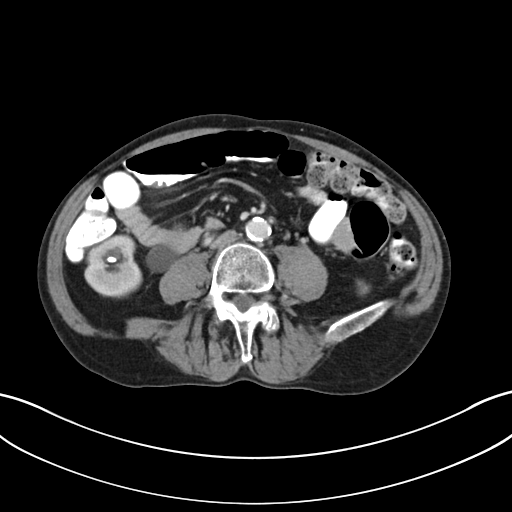
[im 61/96  soft-tissue]
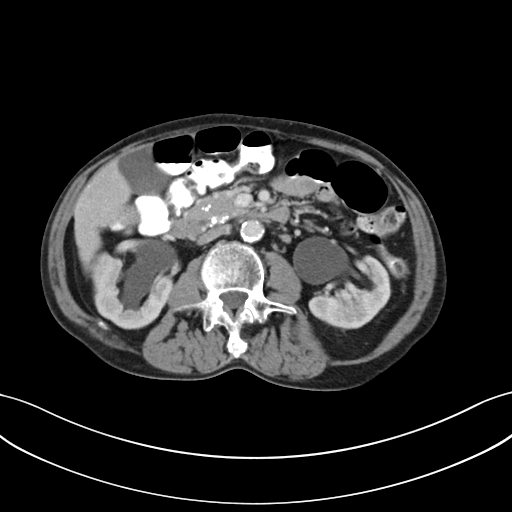
[im 66/96  soft-tissue]
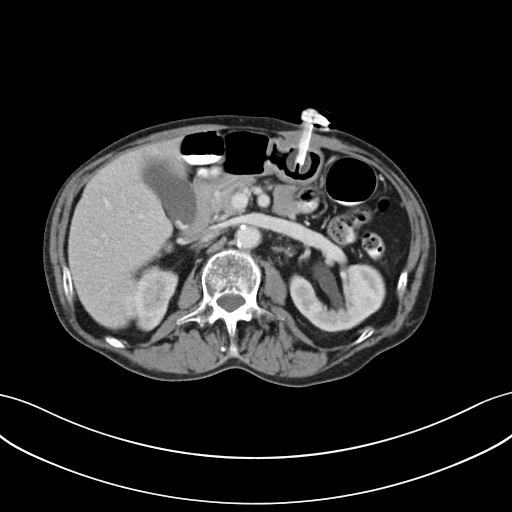
[im 66/96  bone]
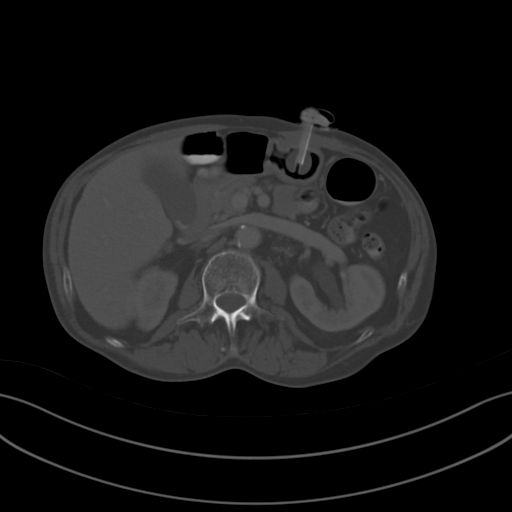
[im 76/96  soft-tissue]
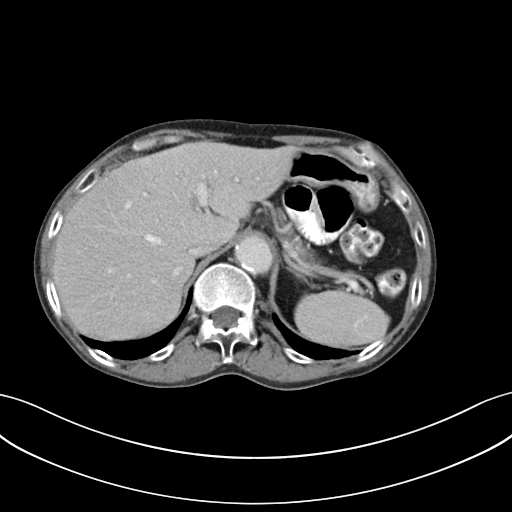
[im 81/96  soft-tissue]
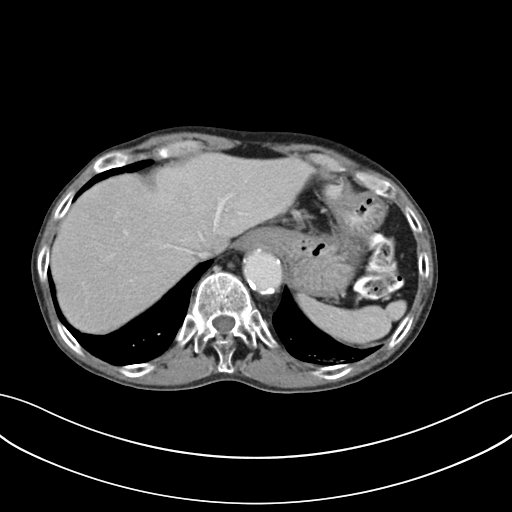
[im 91/96  soft-tissue]
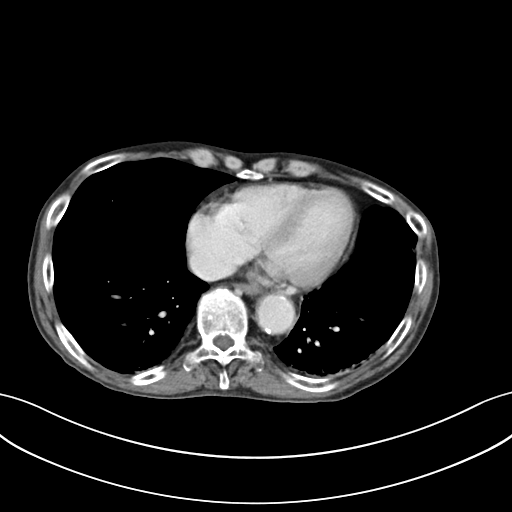

[Series 5: coronals · coronal · 0.70mm/px · 3 of 114 slices shown]
[im 38/114  soft-tissue]
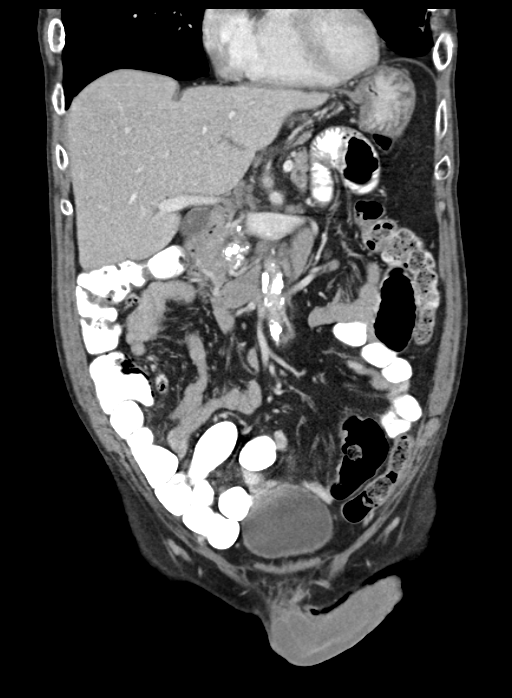
[im 51/114  soft-tissue]
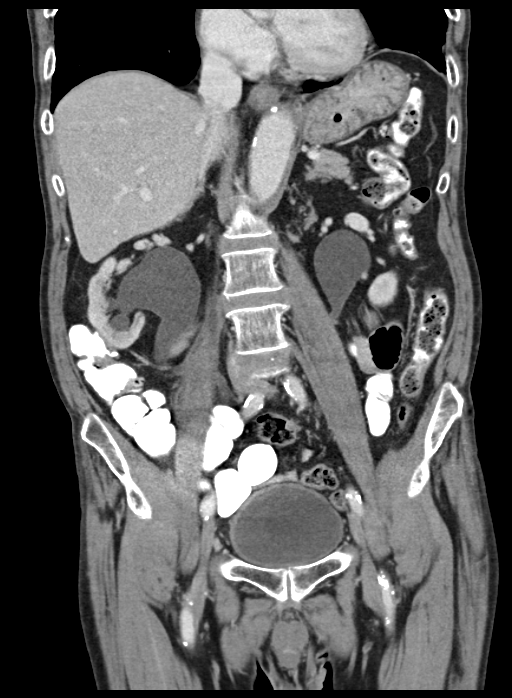
[im 63/114  soft-tissue]
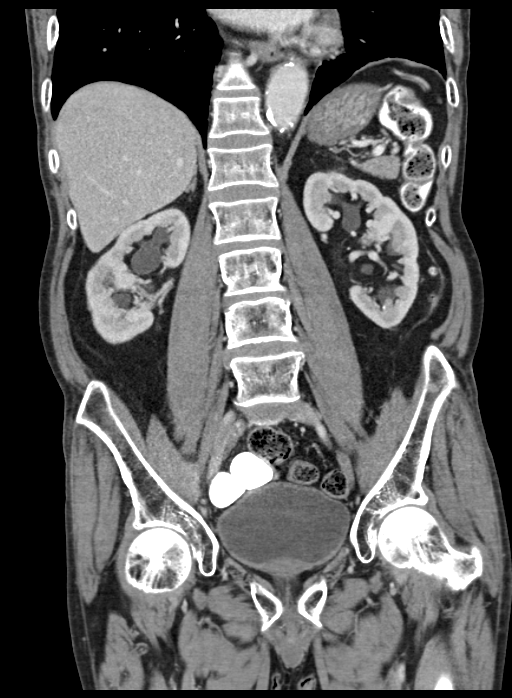

[15 of 46 positions shown; findings below may reference images not displayed]

FINDINGS: Stable calcified nodule in the left lower lobe on sequence 3, image
3. Mild dependent atelectasis at the lung bases without pleural
effusions. Again noted are small subtle nodular densities at the
lung bases which are unchanged.

No evidence for free intraperitoneal air. Patient now has a balloon
retention gastrostomy tube. Normal appearance of the liver,
gallbladder and portal venous system. Chronic calcifications of the
pancreatic head compatible with old pancreatitis. Stable dilatation
of the main pancreatic duct measuring up to 3 mm. Normal appearance
of the spleen.

Normal appearance of the adrenal glands. Again noted is a 7 mm stone
in the right kidney lower pole and there is moderate right
hydroureteronephrosis. There has been chronic dilatation of the
right renal pelvis but the caliceal dilatation has increased from
the prior examination. Mild dilatation of the right ureter without a
stone. There is concern for a small dense lesion or area of
thickening at the left ureterovesical junction, best seen on
sequence 2, image 72. There is chronic dilatation of the left renal
pelvis probably representing an extrarenal pelvis. No significant
dilatation of the left ureter. Chronic punctate stone along the left
kidney lower pole. There is mild to moderate distention of the
urinary bladder and there are subtle areas of asymmetric wall
thickening in the urinary bladder along the trigone and right side
of the bladder.

Atherosclerotic calcifications in the abdominal aorta without
aneurysm. No significant free fluid or lymphadenopathy.

No acute abnormality in the large or small bowel. There is no
significant bowel dilatation to suggest obstruction or ileus. Again
noted is sclerosis along the superior femoral heads suggesting areas
of AVN without collapse.
IMPRESSION: There is no evidence to suggest a bowel obstruction or ileus.

There is dilatation of the renal collecting systems, right side
greater the left. Review of prior examinations suggests that this
has been a chronic finding but there appears to be increased
distension of the right renal collecting system and right ureter of
uncertain etiology. There is no clear evidence for an obstructing
stone. However, there is mild asymmetric wall thickening in the
urinary bladder along the right side, trigone region and possibly
near the left ureterovesical junction. Consider a urology
consultation if the patient has not been evaluated in the past.

Interval placement of gastrostomy tube without complicating
features.

Chronic calcifications at the pancreatic head suggesting old
inflammation.

Nonobstructive bilateral renal calculi.

## 2015-08-30 ENCOUNTER — Other Ambulatory Visit: Payer: Medicare Other

## 2015-09-02 ENCOUNTER — Encounter: Payer: Self-pay | Admitting: Endocrinology

## 2015-09-02 ENCOUNTER — Ambulatory Visit (INDEPENDENT_AMBULATORY_CARE_PROVIDER_SITE_OTHER): Payer: Medicare Other | Admitting: Endocrinology

## 2015-09-02 VITALS — BP 118/84 | HR 100 | Ht 71.0 in | Wt 122.0 lb

## 2015-09-02 DIAGNOSIS — E039 Hypothyroidism, unspecified: Secondary | ICD-10-CM | POA: Diagnosis not present

## 2015-09-02 DIAGNOSIS — E2740 Unspecified adrenocortical insufficiency: Secondary | ICD-10-CM | POA: Diagnosis not present

## 2015-09-02 LAB — COMPREHENSIVE METABOLIC PANEL
ALK PHOS: 107 U/L (ref 39–117)
ALT: 13 U/L (ref 0–53)
AST: 18 U/L (ref 0–37)
Albumin: 3.5 g/dL (ref 3.5–5.2)
BILIRUBIN TOTAL: 0.9 mg/dL (ref 0.2–1.2)
BUN: 10 mg/dL (ref 6–23)
CO2: 28 mEq/L (ref 19–32)
Calcium: 9.3 mg/dL (ref 8.4–10.5)
Chloride: 103 mEq/L (ref 96–112)
Creatinine, Ser: 1.1 mg/dL (ref 0.40–1.50)
GFR: 86.95 mL/min (ref >60.00)
GLUCOSE: 63 mg/dL — AB (ref 70–99)
Potassium: 3.7 mEq/L (ref 3.5–5.1)
SODIUM: 139 meq/L (ref 135–145)
TOTAL PROTEIN: 7.6 g/dL (ref 6.0–8.3)

## 2015-09-02 NOTE — Progress Notes (Signed)
[name], male   DOB: 05-29-1950, 65 y.o.   MRN: [id number]            Reason for Appointment: Blood pressure and  adrenal insufficiency, follow-up visit    History of Present Illness:   PROBLEM 1:   ORTHOSTATIC hypotension: He was started back on Florinef in 1/17 when his standing blood pressure was only 84/56 but subsequently with taking 1 tablet twice a week his blood pressure went up significantly higher, was not taking any bisoprolol at this time  He was given Florinef twice a week in 4/17 because of a standing blood pressure as low as 80/68 and occasional episodes of lightheadedness Now taking Florinef 3 times a week Recently has not had any lightheadedness.  He thinks his blood pressure goes up and down at home and does not know what the readings are Also on potassium supplements  Lab Results  Component Value Date   CREATININE 1.07 07/22/2015   BUN 10 07/22/2015   NA 137 07/22/2015   K 4.2 07/22/2015   CL 103 07/22/2015   CO2 24 07/22/2015      ADRENAL insufficiency:  His blood pressure apparently started getting low in April and his antihypertensive medications were progressively tapered off Cortisol level was low during his hospitalization in 5/16 Subsequently he continued to have problems with decreased appetite, low blood pressure and weight loss He has not had a Cortrosyn stimulation test done, since he was on hydrocortisone this was continued on increased to therapeutic levels  ACTH level when he was on low doses of hydrocortisone was relatively low at 2.9 indicating secondary adrenal insufficiency  Because of persistently low cortisol levels his PCP started him on hydrocortisone 15 mg a day in early June 2016 Subsequently continued to have problems with low blood pressure on standing up and feeling dizzy; also was having frequent near syncopal episodes and falling because of low blood pressure In 8/16 he was started on Florinef every other day  and midodrine stopped.  RECENT history: Consistent with taking 20 mg hydrocortisone in the morning and 10 mg the evening Has normal appetite  Weight has stabilized   Wt Readings from Last 3 Encounters:  09/02/15 122 lb (55.339 kg)  07/22/15 120 lb (54.432 kg)  06/24/15 121 lb 9.6 oz (55.157 kg)    PROBLEM 2:  Hypothyroidism was first diagnosed in 2012   The symptoms consistent with hypothyroidism  had been: fatigue, feeling sleepy but no known history of cold sensitivity, difficulty concentrating His medications have been adjusted periodically but TSH has never been normal since 2012 Not clear the reason why his TSH was significantly high in 2013 and also 2015; patient is not a good historian and difficult to communicate with him today His prescription history indicates that he has been taking anywhere between 150 up to 250 g of levothyroxine since about 2013 Earlier this year his TSH levels were in the hyperthyroid range and his dose of levothyroxine on his initial consultation was 162 g daily The patient  did complain of feeling palpitations and at times will get shaky. Since the patient's free T4 level was markedly increased at 1.95 in 09/2014 he was told to reduce the dose to 112 g  RECENT history:  With 112 g his thyroid levels have been mostly near normal and now stable  Does not complain of any fatigue  TSH levels have been as follows:  Lab Results  Component Value Date   TSH 1.40  07/22/2015   TSH 0.73 05/24/2015   TSH 4.84* 03/23/2015   TSH 0.48 12/14/2014   TSH 0.18* 11/02/2014   TSH <0.008* 09/23/2014   TSH 0.078* 05/29/2014   TSH <0.010* 04/10/2014   TSH 99.937* 05/09/2013   TSH 15.352* 12/04/2012    More recent thyroid levels:         Lab Results  Component Value Date   TSH 1.40 07/22/2015   TSH 0.73 05/24/2015   TSH 4.84* 03/23/2015   FREET4 0.92 07/22/2015   FREET4 1.20 05/24/2015   FREET4 0.61 03/23/2015        Past Medical History    Diagnosis Date  . Hypertension   . Cirrhosis of liver ([place])   . Hypothyroidism   . Alcohol abuse   . CHF (congestive heart failure) ([place])     EF 45-50% 05/2012  . Hyperlipidemia   . Arthritis   . Laryngeal cancer ([place]) 2011    Laryngectomy, T3N0M0  . Seizures [facility])     Past Surgical History  Procedure Laterality Date  . Tracheal surgery    . Tracheostomy    . Left and right heart catheterization with coronary angiogram N/A 12/28/2011    Procedure: LEFT AND RIGHT HEART CATHETERIZATION WITH CORONARY ANGIOGRAM;  Surgeon: Jolaine Artist, MD;  Location: [facility] CATH LAB;  Service: Cardiovascular;  Laterality: N/A;  . Esophagogastroduodenoscopy N/A 04/10/2014    Procedure: ESOPHAGOGASTRODUODENOSCOPY (EGD);  Surgeon: Missy Sabins, MD;  Location: [facility] ENDOSCOPY;  Service: Endoscopy;  Laterality: N/A;    Family History  Problem Relation Age of Onset  . Hyperlipidemia Mother   . Hypertension Mother   . Hypertension Father   . Hyperlipidemia Father     Social History:  reports that he has quit smoking. His smoking use included Cigarettes. He has a 10 pack-year smoking history. He has never used smokeless tobacco. He reports that he drinks alcohol. He reports that he does not use illicit drugs.  Allergies:  Allergies  Allergen Reactions  . Vicodin [Hydrocodone-Acetaminophen] Rash  . Other     Unknown anesthesia medicine caused cardiac arrest.  . Tylenol [Acetaminophen]     HBP -Liver Cirrhosis      Medication List       This list is accurate as of: 09/02/15  9:47 AM.  Always use your most recent med list.               aspirin EC 81 MG tablet  Take 81 mg by mouth daily.     bisoprolol 5 MG tablet  Commonly known as:  ZEBETA  Take 5 mg by mouth daily.     bisoprolol 5 MG tablet  Commonly known as:  ZEBETA  TAKE 1/2 TABLET BY MOUTH DAILY     dronabinol 10 MG capsule  Commonly known as:  MARINOL  Take 10 mg by mouth 2 (two) times daily before a meal.     feeding  supplement (JEVITY 1.5 CAL) Liqd  Place 70 mL/hr into feeding tube continuous.     fentaNYL 25 MCG/HR patch  Commonly known as:  DURAGESIC - dosed mcg/hr  Place 1 patch (25 mcg total) onto the skin every 3 (three) days.     fluconazole 200 MG tablet  Commonly known as:  DIFLUCAN  Take 200 mg by mouth daily.     fludrocortisone 0.1 MG tablet  Commonly known as:  FLORINEF  Tae 3 times a week     free water Soln  Place 175 mLs into  feeding tube 4 (four) times daily.     guaiFENesin 600 MG 12 hr tablet  Commonly known as:  MUCINEX  Take 1 tablet (600 mg total) by mouth 2 (two) times daily.     hydrocortisone 20 MG tablet  Commonly known as:  CORTEF  TAKE 1 TABLET BY MOUTH TWICE DAILY     levothyroxine 112 MCG tablet  Commonly known as:  SYNTHROID, LEVOTHROID  TAKE 1 TABLET(112 MCG) BY MOUTH DAILY BEFORE BREAKFAST     lidocaine 2 % solution  Commonly known as:  XYLOCAINE  Use as directed 10 mLs in the mouth or throat 4 (four) times daily as needed for mouth pain.     magic mouthwash w/lidocaine Soln  Take 5 mLs by mouth 4 (four) times daily.     nystatin 100000 UNIT/ML suspension  Commonly known as:  MYCOSTATIN     ondansetron 4 MG tablet  Commonly known as:  ZOFRAN  Take 1 tablet (4 mg total) by mouth every 8 (eight) hours as needed for nausea or vomiting.     oxyCODONE 5 MG/5ML solution  Commonly known as:  ROXICODONE  Take 5 mLs (5 mg total) by mouth every 4 (four) hours as needed for severe pain.     Potassium Chloride ER 20 MEQ Tbcr  Take 8 mEq by mouth daily.        ROS        He has had previous history of hypertension and sinus tachycardia.   Started back on 5 mg of bisoprolol, unable to get Bystolic covered by his insurance   He continues to have problems with relative rapid heart rate although he does not complain of symptoms.    Examination:    BP 118/84 mmHg  Pulse 100  Ht 5\' 11"  (1.803 m)  Wt 122 lb (55.339 kg)  BMI 17.02 kg/m2  Standing  blood pressure left 140/100 and on the right 118/84 Pulses appear equal on both sides including brachial , reduced left radial pulse  No ankle edema   Assessments   1.  Orthostatic hypotension: His blood pressure is unusually high today even with taking Florinef 3 times a week Also for some reason appears to have asymmetrical readings in both arms with lower readings on the right No edema with this  Still taking bisoprolol Note that he previously did not have any benefit from midodrine  2.  Adrenal insufficiency, secondary: Adequately supplemented   3.  Hypertension.  Blood pressure may improve with reducing Florinef  4.  Hypokalemia: Needs follow-up  5.  Hypothyroidism: Will have TSH checked again on the next visit  Treatment:    1. He will use a half tablet Florinef 3 times a week 2. Check labs today 3. Continue bisoprolol unchanged  4. Continue potassium supplement 5. Follow-up in 1 month 6. No change in hydrocortisone    Patient Instructions  Take Florinef half tablet 3 times a week     Joniece Smotherman 09/02/2015, 9:47 AM   Note: This office note was prepared with Estate agent. Any transcriptional errors that result from this process are unintentional.

## 2015-09-02 NOTE — Patient Instructions (Signed)
Take Florinef half tablet 3 times a week

## 2015-10-01 ENCOUNTER — Other Ambulatory Visit (INDEPENDENT_AMBULATORY_CARE_PROVIDER_SITE_OTHER): Payer: Medicare Other

## 2015-10-01 DIAGNOSIS — E039 Hypothyroidism, unspecified: Secondary | ICD-10-CM

## 2015-10-01 DIAGNOSIS — E2740 Unspecified adrenocortical insufficiency: Secondary | ICD-10-CM | POA: Diagnosis not present

## 2015-10-01 LAB — BASIC METABOLIC PANEL
BUN: 11 mg/dL (ref 6–23)
CO2: 31 meq/L (ref 19–32)
Calcium: 9.7 mg/dL (ref 8.4–10.5)
Chloride: 103 mEq/L (ref 96–112)
Creatinine, Ser: 1.04 mg/dL (ref 0.40–1.50)
GFR: 92.74 mL/min (ref >60.00)
GLUCOSE: 74 mg/dL (ref 70–99)
POTASSIUM: 3.5 meq/L (ref 3.5–5.1)
SODIUM: 139 meq/L (ref 135–145)

## 2015-10-01 LAB — T4, FREE: Free T4: 1.2 ng/dL (ref 0.60–1.60)

## 2015-10-01 LAB — TSH: TSH: 1.33 u[IU]/mL (ref 0.35–4.50)

## 2015-10-05 ENCOUNTER — Other Ambulatory Visit: Payer: Self-pay | Admitting: Endocrinology

## 2015-10-06 ENCOUNTER — Ambulatory Visit (INDEPENDENT_AMBULATORY_CARE_PROVIDER_SITE_OTHER): Payer: Medicare Other | Admitting: Endocrinology

## 2015-10-06 ENCOUNTER — Encounter: Payer: Self-pay | Admitting: Endocrinology

## 2015-10-06 ENCOUNTER — Other Ambulatory Visit: Payer: Self-pay | Admitting: Family Medicine

## 2015-10-06 ENCOUNTER — Other Ambulatory Visit: Payer: Self-pay | Admitting: Endocrinology

## 2015-10-06 VITALS — BP 130/90 | HR 84 | Ht 71.0 in | Wt 128.0 lb

## 2015-10-06 DIAGNOSIS — E039 Hypothyroidism, unspecified: Secondary | ICD-10-CM | POA: Diagnosis not present

## 2015-10-06 DIAGNOSIS — I951 Orthostatic hypotension: Secondary | ICD-10-CM | POA: Diagnosis not present

## 2015-10-06 MED ORDER — BISOPROLOL FUMARATE 10 MG PO TABS: 10.0000 mg | ORAL_TABLET | Freq: Every day | ORAL | 2 refills | Status: DC

## 2015-10-06 NOTE — Progress Notes (Signed)
[name], male   DOB: 12/01/1950, 65 y.o.   MRN: [id number]            Reason for Appointment: Blood pressure and  adrenal insufficiency, follow-up visit    History of Present Illness:   PROBLEM 1:   ORTHOSTATIC hypotension: [name] was started back on Florinef in 1/17 when his standing blood pressure was only 84/56 but subsequently with taking 1 tablet twice a week his blood pressure went up significantly higher, was not taking any bisoprolol at this time  [name] was given Florinef twice a week in 4/17 because of a standing blood pressure as low as 80/68 and occasional episodes of lightheadedness  However because of continued increase in blood pressure Florinef has been progressively reduced and now taking only half tablet 3 times a week Recently has not had any lightheadedness.  [name] does not know what his blood pressure readings are at home Also on potassium supplements  Lab Results  Component Value Date   CREATININE 1.04 10/01/2015   BUN 11 10/01/2015   NA 139 10/01/2015   K 3.5 10/01/2015   CL 103 10/01/2015   CO2 31 10/01/2015      ADRENAL insufficiency:  His blood pressure apparently started getting low in April and his antihypertensive medications were progressively tapered off Cortisol level was low during his hospitalization in 5/16 Subsequently [name] continued to have problems with decreased appetite, low blood pressure and weight loss [name] has not had a Cortrosyn stimulation test done, since [name] was on hydrocortisone this was continued on increased to therapeutic levels  ACTH level when [name] was on low doses of hydrocortisone was relatively low at 2.9 indicating secondary adrenal insufficiency  Because of persistently low cortisol levels his PCP started him on hydrocortisone 15 mg a day in early June 2016 Subsequently continued to have problems with low blood pressure on standing up and feeling dizzy; also was having frequent near syncopal episodes and falling  because of low blood pressure In 8/16 [name] was started on Florinef every other day and midodrine stopped.  RECENT history: Consistent with taking 20 mg hydrocortisone in the morning and 10 mg the evening Has normal appetite  Weight has improved further   Wt Readings from Last 3 Encounters:  10/06/15 128 lb (58.1 kg)  09/02/15 122 lb (55.3 kg)  07/22/15 120 lb (54.4 kg)    PROBLEM 2:  Hypothyroidism was first diagnosed in 2012   The symptoms consistent with hypothyroidism  had been: fatigue, feeling sleepy but no known history of cold sensitivity, difficulty concentrating His medications have been adjusted periodically but TSH has never been normal since 2012 Not clear the reason why his TSH was significantly high in 2013 and also 2015; patient is not a good historian and difficult to communicate with him today His prescription history indicates that [name] has been taking anywhere between 150 up to 250 g of levothyroxine since about 2013 Earlier this year his TSH levels were in the hyperthyroid range and his dose of levothyroxine on his initial consultation was 162 g daily The patient  did complain of feeling palpitations and at times will get shaky. Since the patient's free T4 level was markedly increased at 1.95 in 09/2014 [name] was told to reduce the dose to 112 g  RECENT history:  With 112 g his thyroid levels have been mostly  normal and now stable  Does not complain of any fatigue  TSH levels have been as follows:  Lab  Results  Component Value Date   TSH 1.33 10/01/2015   TSH 1.40 07/22/2015   TSH 0.73 05/24/2015   TSH 4.84 (H) 03/23/2015   TSH 0.48 12/14/2014   TSH 0.18 (L) 11/02/2014   TSH <0.008 (L) 09/23/2014   TSH 0.078 (L) 05/29/2014   TSH <0.010 (L) 04/10/2014   TSH 99.937 (H) 05/09/2013    More recent thyroid levels:         Lab Results  Component Value Date   TSH 1.33 10/01/2015   TSH 1.40 07/22/2015   TSH 0.73 05/24/2015   FREET4 1.20 10/01/2015    FREET4 0.92 07/22/2015   FREET4 1.20 05/24/2015        Past Medical History:  Diagnosis Date  . Alcohol abuse   . Arthritis   . CHF (congestive heart failure) ([place])    EF 45-50% 05/2012  . Cirrhosis of liver ([place])   . Hyperlipidemia   . Hypertension   . Hypothyroidism   . Laryngeal cancer ([place]) 2011   Laryngectomy, T3N0M0  . Seizures ([place])     Past Surgical History:  Procedure Laterality Date  . ESOPHAGOGASTRODUODENOSCOPY N/A 04/10/2014   Procedure: ESOPHAGOGASTRODUODENOSCOPY (EGD);  Surgeon: Missy Sabins, MD;  Location[facility] ENDOSCOPY;  Service: Endoscopy;  Laterality: N/A;  . LEFT AND RIGHT HEART CATHETERIZATION WITH CORONARY ANGIOGRAM N/A 12/28/2011   Procedure: LEFT AND RIGHT HEART CATHETERIZATION WITH CORONARY ANGIOGRAM;  Surgeon: Jolaine Artist, MD;  Location: [facility] CATH LAB;  Service: Cardiovascular;  Laterality: N/A;  . TRACHEAL SURGERY    . TRACHEOSTOMY      Family History  Problem Relation Age of Onset  . Hyperlipidemia Mother   . Hypertension Mother   . Hypertension Father   . Hyperlipidemia Father     Social History:  reports that [name] has quit smoking. His smoking use included Cigarettes. [name] has a 10.00 pack-year smoking history. [name] has never used smokeless tobacco. [name] reports that [name] drinks alcohol. [name] reports that [name] does not use drugs.  Allergies:  Allergies  Allergen Reactions  . Vicodin [Hydrocodone-Acetaminophen] Rash  . Other     Unknown anesthesia medicine caused cardiac arrest.  . Tylenol [Acetaminophen]     HBP -Liver Cirrhosis      Medication List       Accurate as of 10/06/15  8:57 AM. Always use your most recent med list.          aspirin EC 81 MG tablet Take 81 mg by mouth daily.   bisoprolol 5 MG tablet Commonly known as:  ZEBETA Take 5 mg by mouth daily.   bisoprolol 5 MG tablet Commonly known as:  ZEBETA TAKE 1/2 TABLET BY MOUTH DAILY   dronabinol 10 MG capsule Commonly known as:  MARINOL Take 10 mg by mouth 2 (two)  times daily before a meal.   feeding supplement (JEVITY 1.5 CAL) Liqd Place 70 mL/hr into feeding tube continuous.   fentaNYL 25 MCG/HR patch Commonly known as:  DURAGESIC - dosed mcg/hr Place 1 patch (25 mcg total) onto the skin every 3 (three) days.   fluconazole 200 MG tablet Commonly known as:  DIFLUCAN Take 200 mg by mouth daily.   fludrocortisone 0.1 MG tablet Commonly known as:  FLORINEF Tae 3 times a week   free water Soln Place 175 mLs into feeding tube 4 (four) times daily.   guaiFENesin 600 MG 12 hr tablet Commonly known as:  MUCINEX Take 1 tablet (600 mg total) by mouth 2 (two) times daily.  hydrocortisone 20 MG tablet Commonly known as:  CORTEF TAKE 1 TABLET BY MOUTH TWICE DAILY   levothyroxine 112 MCG tablet Commonly known as:  SYNTHROID, LEVOTHROID TAKE 1 TABLET(112 MCG) BY MOUTH DAILY BEFORE BREAKFAST   lidocaine 2 % solution Commonly known as:  XYLOCAINE Use as directed 10 mLs in the mouth or throat 4 (four) times daily as needed for mouth pain.   magic mouthwash w/lidocaine Soln Take 5 mLs by mouth 4 (four) times daily.   nystatin 100000 UNIT/ML suspension Commonly known as:  MYCOSTATIN   ondansetron 4 MG tablet Commonly known as:  ZOFRAN Take 1 tablet (4 mg total) by mouth every 8 (eight) hours as needed for nausea or vomiting.   oxyCODONE 5 MG/5ML solution Commonly known as:  ROXICODONE Take 5 mLs (5 mg total) by mouth every 4 (four) hours as needed for severe pain.   Potassium Chloride ER 20 MEQ Tbcr Take 8 mEq by mouth daily.       ROS        [name] has had previous history of hypertension and sinus tachycardia.   Started back on 5 mg of bisoprolol, unable to get Bystolic covered by his insurance   [name] continues to have problems with relative rapid heart rate although [name] does not complain of symptoms.    Examination:    BP (!) 142/98 (BP Location: Right Arm, Cuff Size: Normal)   Pulse 84   Ht 5\' 11"  (1.803 m)   Wt 128 lb (58.1 kg)    SpO2 98%   BMI 17.85 kg/m   Standing blood pressure left 130/90 Pulses appear equal on both sides including brachial , reduced left radial pulse  No ankle edema   Assessments   1.  Orthostatic hypotension: His blood pressure is Still high today even with taking half tablet Florinef 3 times a week   Note that [name] previously did not have any benefit from midodrine  2.  Adrenal insufficiency, secondary: Adequately supplemented   3.  Hypertension.  Blood pressure continues to be high with relatively fast pulse rate  4.  Hypokalemia: Taking supplement, potassium 3.5  5.  Hypothyroidism: Very stable now and normal TSH present  Treatment:    1. [name] will use a half tablet Florinef 3 times a week for now 2. Increase bisoprolol to 10 mg 3. Continue potassium supplement 4. Follow-up in 1 month 5. No change in hydrocortisone    There are no Patient Instructions on file for this visit.   Adrianne Shackleton 10/06/2015, 8:57 AM   Note: This office note was prepared with Dragon voice recognition system technology. Any transcriptional errors that result from this process are unintentional.

## 2015-10-06 NOTE — Patient Instructions (Signed)
Take 2 Bisoprolol daily  Florinef 1/2 pill 3x per week

## 2015-10-07 ENCOUNTER — Other Ambulatory Visit: Payer: Self-pay | Admitting: Endocrinology

## 2015-10-07 ENCOUNTER — Other Ambulatory Visit: Payer: Self-pay

## 2015-10-07 MED ORDER — POTASSIUM CHLORIDE ER 20 MEQ PO TBCR: 8.0000 meq | EXTENDED_RELEASE_TABLET | Freq: Every day | ORAL | 3 refills | Status: DC

## 2015-10-07 MED ORDER — FLUDROCORTISONE ACETATE 0.1 MG PO TABS: ORAL_TABLET | ORAL | 1 refills | Status: DC

## 2015-10-26 DIAGNOSIS — Z930 Tracheostomy status: Secondary | ICD-10-CM | POA: Diagnosis not present

## 2015-10-26 DIAGNOSIS — Z79899 Other long term (current) drug therapy: Secondary | ICD-10-CM | POA: Diagnosis not present

## 2015-10-26 DIAGNOSIS — J449 Chronic obstructive pulmonary disease, unspecified: Secondary | ICD-10-CM | POA: Diagnosis not present

## 2015-10-26 DIAGNOSIS — S82892D Other fracture of left lower leg, subsequent encounter for closed fracture with routine healing: Secondary | ICD-10-CM | POA: Diagnosis not present

## 2015-10-26 DIAGNOSIS — E039 Hypothyroidism, unspecified: Secondary | ICD-10-CM | POA: Diagnosis not present

## 2015-10-26 DIAGNOSIS — I10 Essential (primary) hypertension: Secondary | ICD-10-CM | POA: Diagnosis not present

## 2015-10-26 DIAGNOSIS — I509 Heart failure, unspecified: Secondary | ICD-10-CM | POA: Diagnosis not present

## 2015-10-26 DIAGNOSIS — Z87891 Personal history of nicotine dependence: Secondary | ICD-10-CM | POA: Diagnosis not present

## 2015-10-26 DIAGNOSIS — S82842D Displaced bimalleolar fracture of left lower leg, subsequent encounter for closed fracture with routine healing: Secondary | ICD-10-CM | POA: Diagnosis not present

## 2015-10-26 DIAGNOSIS — Z7982 Long term (current) use of aspirin: Secondary | ICD-10-CM | POA: Diagnosis not present

## 2015-10-26 DIAGNOSIS — Z885 Allergy status to narcotic agent status: Secondary | ICD-10-CM | POA: Diagnosis not present

## 2015-11-03 ENCOUNTER — Ambulatory Visit (INDEPENDENT_AMBULATORY_CARE_PROVIDER_SITE_OTHER): Payer: Medicare Other | Admitting: Endocrinology

## 2015-11-03 ENCOUNTER — Encounter: Payer: Self-pay | Admitting: Endocrinology

## 2015-11-03 VITALS — BP 139/89 | HR 94 | Ht 71.0 in | Wt 124.0 lb

## 2015-11-03 DIAGNOSIS — E2740 Unspecified adrenocortical insufficiency: Secondary | ICD-10-CM | POA: Diagnosis not present

## 2015-11-03 DIAGNOSIS — E039 Hypothyroidism, unspecified: Secondary | ICD-10-CM | POA: Diagnosis not present

## 2015-11-03 NOTE — Progress Notes (Signed)
[name], male   DOB: 04/29/1950, 65 y.o.   MRN: [id number]            Reason for Appointment: Blood pressure and  adrenal insufficiency, follow-up visit    History of Present Illness:   PROBLEM 1:   ORTHOSTATIC hypotension: He was started back on Florinef in 1/17 when his standing blood pressure was only 84/56 but subsequently with taking 1 tablet twice a week his blood pressure went up significantly higher, was not taking any bisoprolol at this time  He was given Florinef twice a week in 4/17 because of a standing blood pressure as low as 80/68 and occasional episodes of lightheadedness  However because of continued increase in blood pressure Florinef has been progressively reduced He was supposed to be reducing the dose on his last visit to taking only half tablet 3 times a week but he thinks he is taking a whole tablet 3 times a week.  His daughter is helping his medication compliance but she is not present today  Recently has not had any lightheadedness.  He does not know what his blood pressure readings are at home when checked by his daughter  He says that he is not consistently watching added salt to his diet especially with eating pork and ham   Lab Results  Component Value Date   CREATININE 1.04 10/01/2015   BUN 11 10/01/2015   NA 139 10/01/2015   K 3.5 10/01/2015   CL 103 10/01/2015   CO2 31 10/01/2015      ADRENAL insufficiency:  His blood pressure apparently started getting low in April and his antihypertensive medications were progressively tapered off Cortisol level was low during his hospitalization in 5/16 Subsequently he continued to have problems with decreased appetite, low blood pressure and weight loss He has not had a Cortrosyn stimulation test done, since he was on hydrocortisone this was continued on increased to therapeutic levels  ACTH level when he was on low doses of hydrocortisone was relatively low at 2.9 indicating secondary  adrenal insufficiency  Because of persistently low cortisol levels his PCP started him on hydrocortisone 15 mg a day in early June 2016 Subsequently continued to have problems with low blood pressure on standing up and feeling dizzy; also was having frequent near syncopal episodes and falling because of low blood pressure In 8/16 he was started on Florinef every other day and midodrine stopped.  RECENT history: Consistent with taking 20 mg hydrocortisone in the morning and 10 mg the evening Has normal appetite  Not clear why his weight has gone down slightly  Wt Readings from Last 3 Encounters:  11/03/15 124 lb (56.2 kg)  10/06/15 128 lb (58.1 kg)  09/02/15 122 lb (55.3 kg)    PROBLEM 2:  Hypothyroidism was first diagnosed in 2012   The symptoms consistent with hypothyroidism  had been: fatigue, feeling sleepy but no known history of cold sensitivity, difficulty concentrating His medications have been adjusted periodically but TSH has never been normal since 2012 Not clear the reason why his TSH was significantly high in 2013 and also 2015; patient is not a good historian and difficult to communicate with him today His prescription history indicates that he has been taking anywhere between 150 up to 250 g of levothyroxine since about 2013 Earlier this year his TSH levels were in the hyperthyroid range and his dose of levothyroxine on his initial consultation was 162 g daily The patient  did complain of  feeling palpitations and at times will get shaky. Since the patient's free T4 level was markedly increased at 1.95 in 09/2014 he was told to reduce the dose to 112 g  RECENT history:  With 112 g his thyroid levels have been mostly  normal and now stable  Does not complain of any fatigue  TSH levels have been as follows:  Lab Results  Component Value Date   TSH 1.33 10/01/2015   TSH 1.40 07/22/2015   TSH 0.73 05/24/2015   TSH 4.84 (H) 03/23/2015   TSH 0.48 12/14/2014   TSH  0.18 (L) 11/02/2014   TSH <0.008 (L) 09/23/2014   TSH 0.078 (L) 05/29/2014   TSH <0.010 (L) 04/10/2014   TSH 99.937 (H) 05/09/2013    More recent thyroid levels:         Lab Results  Component Value Date   TSH 1.33 10/01/2015   TSH 1.40 07/22/2015   TSH 0.73 05/24/2015   FREET4 1.20 10/01/2015   FREET4 0.92 07/22/2015   FREET4 1.20 05/24/2015    HYPERTENSION: Discussed in review of systems    Past Medical History:  Diagnosis Date  . Alcohol abuse   . Arthritis   . CHF (congestive heart failure) ([place])    EF 45-50% 05/2012  . Cirrhosis of liver ([place])   . Hyperlipidemia   . Hypertension   . Hypothyroidism   . Laryngeal cancer ([place]) 2011   Laryngectomy, T3N0M0  . Seizures ([place])     Past Surgical History:  Procedure Laterality Date  . ESOPHAGOGASTRODUODENOSCOPY N/A 04/10/2014   Procedure: ESOPHAGOGASTRODUODENOSCOPY (EGD);  Surgeon: Missy Sabins, MD;  Location: [facility] ENDOSCOPY;  Service: Endoscopy;  Laterality: N/A;  . LEFT AND RIGHT HEART CATHETERIZATION WITH CORONARY ANGIOGRAM N/A 12/28/2011   Procedure: LEFT AND RIGHT HEART CATHETERIZATION WITH CORONARY ANGIOGRAM;  Surgeon: Jolaine Artist, MD;  Location: [facility] CATH LAB;  Service: Cardiovascular;  Laterality: N/A;  . TRACHEAL SURGERY    . TRACHEOSTOMY      Family History  Problem Relation Age of Onset  . Hyperlipidemia Mother   . Hypertension Mother   . Hypertension Father   . Hyperlipidemia Father     Social History:  reports that he has quit smoking. His smoking use included Cigarettes. He has a 10.00 pack-year smoking history. He has never used smokeless tobacco. He reports that he drinks alcohol. He reports that he does not use drugs.  Allergies:  Allergies  Allergen Reactions  . Vicodin [Hydrocodone-Acetaminophen] Rash  . Other     Unknown anesthesia medicine caused cardiac arrest.  . Tylenol [Acetaminophen]     HBP -Liver Cirrhosis      Medication List       Accurate as of 11/03/15 11:36 AM.  Always use your most recent med list.          aspirin EC 81 MG tablet Take 81 mg by mouth daily.   bisoprolol 10 MG tablet Commonly known as:  ZEBETA Take 1 tablet (10 mg total) by mouth daily.   dronabinol 10 MG capsule Commonly known as:  MARINOL Take 10 mg by mouth 2 (two) times daily before a meal.   feeding supplement (JEVITY 1.5 CAL) Liqd Place 70 mL/hr into feeding tube continuous.   fentaNYL 25 MCG/HR patch Commonly known as:  DURAGESIC - dosed mcg/hr Place 1 patch (25 mcg total) onto the skin every 3 (three) days.   fluconazole 200 MG tablet Commonly known as:  DIFLUCAN Take 200 mg by mouth daily.  fludrocortisone 0.1 MG tablet Commonly known as:  FLORINEF TAKE 1 TABLET BY MOUTH 3 TIMES A WEEK   free water Soln Place 175 mLs into feeding tube 4 (four) times daily.   guaiFENesin 600 MG 12 hr tablet Commonly known as:  MUCINEX Take 1 tablet (600 mg total) by mouth 2 (two) times daily.   hydrocortisone 20 MG tablet Commonly known as:  CORTEF TAKE 1 TABLET BY MOUTH TWICE DAILY   levothyroxine 112 MCG tablet Commonly known as:  SYNTHROID, LEVOTHROID TAKE 1 TABLET(112 MCG) BY MOUTH DAILY BEFORE BREAKFAST   lidocaine 2 % solution Commonly known as:  XYLOCAINE Use as directed 10 mLs in the mouth or throat 4 (four) times daily as needed for mouth pain.   magic mouthwash w/lidocaine Soln Take 5 mLs by mouth 4 (four) times daily.   nystatin 100000 UNIT/ML suspension Commonly known as:  MYCOSTATIN   ondansetron 4 MG tablet Commonly known as:  ZOFRAN Take 1 tablet (4 mg total) by mouth every 8 (eight) hours as needed for nausea or vomiting.   oxyCODONE 5 MG/5ML solution Commonly known as:  ROXICODONE Take 5 mLs (5 mg total) by mouth every 4 (four) hours as needed for severe pain.   Potassium Chloride ER 20 MEQ Tbcr TAKE 1 TABLET BY MOUTH DAILY       ROS        He has had previous history of hypertension and sinus tachycardia.   Blood pressure was  high on 5 mg of bisoprolol, this was increased to 10 mg on his last visit  He continues to have problems with relative rapid heart rate although he does not complain of symptoms.    Examination:    BP 139/89   Pulse 94   Ht 5\' 11"  (1.803 m)   Wt 124 lb (56.2 kg)   BMI 17.29 kg/m   Standing blood pressure left 134/92 and right side 110/82  No ankle edema   Assessments   1.  Orthostatic hypotension: His blood pressure is Still high. Appears that he is taking the full tablet of Florinef instead of a half even after instructions on the last visit Also he appears to be getting high sodium foods at times and probably with his increased appetite is overall salt intake is higher   Note that he previously did not have any benefit from midodrine  2.  Adrenal insufficiency, secondary: Adequately supplemented and not symptomatic   3.  Hypertension.  Blood pressure continues to be high with relatively fast pulse rate Also appears to have an equal blood pressure is higher reading on the left side   Treatment:    1. He will stop Florinef for now 2. His daughter will check his blood pressure regularly and call if starting to get low 3. Low-sodium diet 4. Stay on bisoprolol 10 mg 5. Follow-up in 6 weeks with repeat labs 6. No change in hydrocortisone    Patient Instructions  Stop Florinef  Reduce ham and hi salt foods  Call BP reading in 2 weeks; Check on left side    Andrus Sharp 11/03/2015, 11:36 AM   Note: This office note was prepared with Estate agent. Any transcriptional errors that result from this process are unintentional.

## 2015-11-03 NOTE — Patient Instructions (Signed)
Stop Florinef  Reduce ham and hi salt foods  Call BP reading in 2 weeks; Check on left side

## 2015-12-01 ENCOUNTER — Other Ambulatory Visit: Payer: Self-pay | Admitting: *Deleted

## 2015-12-01 MED ORDER — BISOPROLOL FUMARATE 10 MG PO TABS: 10.0000 mg | ORAL_TABLET | Freq: Every day | ORAL | 2 refills | Status: DC

## 2015-12-22 ENCOUNTER — Other Ambulatory Visit (INDEPENDENT_AMBULATORY_CARE_PROVIDER_SITE_OTHER): Payer: Medicare Other

## 2015-12-22 ENCOUNTER — Ambulatory Visit (INDEPENDENT_AMBULATORY_CARE_PROVIDER_SITE_OTHER): Payer: Medicare Other | Admitting: *Deleted

## 2015-12-22 DIAGNOSIS — E039 Hypothyroidism, unspecified: Secondary | ICD-10-CM | POA: Diagnosis not present

## 2015-12-22 DIAGNOSIS — E2740 Unspecified adrenocortical insufficiency: Secondary | ICD-10-CM | POA: Diagnosis not present

## 2015-12-22 DIAGNOSIS — Z23 Encounter for immunization: Secondary | ICD-10-CM

## 2015-12-22 LAB — BASIC METABOLIC PANEL
BUN: 17 mg/dL (ref 6–23)
CALCIUM: 9.7 mg/dL (ref 8.4–10.5)
CO2: 28 mEq/L (ref 19–32)
CREATININE: 1.12 mg/dL (ref 0.40–1.50)
Chloride: 100 mEq/L (ref 96–112)
GFR: 85.08 mL/min (ref >60.00)
Glucose, Bld: 88 mg/dL (ref 70–99)
Potassium: 4.5 mEq/L (ref 3.5–5.1)
SODIUM: 138 meq/L (ref 135–145)

## 2015-12-22 LAB — TSH: TSH: 12.25 u[IU]/mL — AB (ref 0.35–4.50)

## 2015-12-22 LAB — T4, FREE: FREE T4: 0.79 ng/dL (ref 0.60–1.60)

## 2015-12-27 ENCOUNTER — Encounter: Payer: Self-pay | Admitting: Endocrinology

## 2015-12-27 ENCOUNTER — Ambulatory Visit (INDEPENDENT_AMBULATORY_CARE_PROVIDER_SITE_OTHER): Payer: Medicare Other | Admitting: Endocrinology

## 2015-12-27 VITALS — BP 150/100 | HR 88 | Temp 97.9°F | Resp 16 | Ht 71.0 in | Wt 127.8 lb

## 2015-12-27 DIAGNOSIS — I10 Essential (primary) hypertension: Secondary | ICD-10-CM

## 2015-12-27 MED ORDER — AMLODIPINE BESYLATE 5 MG PO TABS
5.0000 mg | ORAL_TABLET | Freq: Every day | ORAL | 3 refills | Status: DC
Start: 2015-12-27 — End: 2016-08-03

## 2015-12-27 MED ORDER — LEVOTHYROXINE SODIUM 125 MCG PO TABS: 125.0000 ug | ORAL_TABLET | Freq: Every day | ORAL | 3 refills | Status: DC

## 2015-12-27 NOTE — Patient Instructions (Signed)
Need a Primary MD  New rx for BP

## 2015-12-27 NOTE — Progress Notes (Signed)
[name], male   DOB: 1950/09/03, 65 y.o.   MRN: [id number]            Reason for Appointment: Blood pressure and  adrenal insufficiency, follow-up visit    History of Present Illness:   PROBLEM 1:    ORTHOSTATIC hypotension/hypertension: He was started back on Florinef in 1/17 when his standing blood pressure was only 84/56 but subsequently with taking 1 tablet twice a week his blood pressure went up significantly higher, was not taking any bisoprolol at this time He was given Florinef twice a week in 4/17 because of a standing blood pressure as low as 80/68 and occasional episodes of lightheadedness  However because of continued increase in blood pressure Florinef has been progressively reduced and then stopped  On his last visit he had not stopped his Florinef even though he was told to do so and his blood pressure was relatively high He does have his blood pressure checked at home but not clear if he remembers the readings, last reading may have been 122/97 He was also advised to cut back on high sodium foods and meats with sodium    Lab Results  Component Value Date   CREATININE 1.12 12/22/2015   BUN 17 12/22/2015   NA 138 12/22/2015   K 4.5 12/22/2015   CL 100 12/22/2015   CO2 28 12/22/2015      ADRENAL insufficiency:   RECENT history:  He is doing well subjectively with no nausea, lightheadedness or change in appetite Consistent with taking 20 mg hydrocortisone in the morning and 10 mg the evening   Wt Readings from Last 3 Encounters:  12/27/15 127 lb 12.8 oz (58 kg)  11/03/15 124 lb (56.2 kg)  10/06/15 128 lb (58.1 kg)    Past history: His blood pressure  started getting low in 06/2014 and his antihypertensive medications were progressively tapered off Cortisol level was low during his hospitalization in 5/16 Subsequently he continued to have problems with decreased appetite, low blood pressure and weight loss He has not had a Cortrosyn  stimulation test done, since he was on hydrocortisone this was continued on increased to therapeutic levels  ACTH level when he was on low doses of hydrocortisone was relatively low at 2.9 indicating secondary adrenal insufficiency  Because of persistently low cortisol levels his PCP started him on hydrocortisone 15 mg a day in early June 2016 Subsequently continued to have problems with low blood pressure on standing up and feeling dizzy; also was having frequent near syncopal episodes and falling because of low blood pressure He was then seen for consultation here   PROBLEM 2:  Hypothyroidism was first diagnosed in 2012   The symptoms consistent with hypothyroidism  had been: fatigue, feeling sleepy but no known history of cold sensitivity, difficulty concentrating His medications have been adjusted periodically but TSH has never been normal since 2012 Not clear the reason why his TSH was significantly high in 2013 and also 2015; patient is not a good historian and difficult to communicate with him today His prescription history indicates that he has been taking anywhere between 150 up to 250 g of levothyroxine since about 2013 Earlier this year his TSH levels were in the hyperthyroid range and his dose of levothyroxine on his initial consultation was 162 g daily The patient  did complain of feeling palpitations and at times will get shaky. Since the patient's free T4 level was markedly increased at 1.95 in 09/2014 he was  told to reduce the dose to 112 g  RECENT history:  With 112 g his thyroid levels have been  Previously consistent and normal However even though he thinks he has been very compliant with his levothyroxine his TSH is now relatively high At times will feel tired the bottom  TSH levels have been as follows:  Lab Results  Component Value Date   TSH 12.25 (H) 12/22/2015   TSH 1.33 10/01/2015   TSH 1.40 07/22/2015   TSH 0.73 05/24/2015   TSH 4.84 (H) 03/23/2015    TSH 0.48 12/14/2014   TSH 0.18 (L) 11/02/2014   TSH <0.008 (L) 09/23/2014   TSH 0.078 (L) 05/29/2014   TSH <0.010 (L) 04/10/2014    More recent thyroid levels:         Lab Results  Component Value Date   TSH 12.25 (H) 12/22/2015   TSH 1.33 10/01/2015   TSH 1.40 07/22/2015   FREET4 0.79 12/22/2015   FREET4 1.20 10/01/2015   FREET4 0.92 07/22/2015       Past Medical History:  Diagnosis Date  . Alcohol abuse   . Arthritis   . CHF (congestive heart failure) ([place])    EF 45-50% 05/2012  . Cirrhosis of liver ([place])   . Hyperlipidemia   . Hypertension   . Hypothyroidism   . Laryngeal cancer ([place]) 2011   Laryngectomy, T3N0M0  . Seizures ([place])     Past Surgical History:  Procedure Laterality Date  . ESOPHAGOGASTRODUODENOSCOPY N/A 04/10/2014   Procedure: ESOPHAGOGASTRODUODENOSCOPY (EGD);  Surgeon: Missy Sabins, MD;  Location: [facility] ENDOSCOPY;  Service: Endoscopy;  Laterality: N/A;  . LEFT AND RIGHT HEART CATHETERIZATION WITH CORONARY ANGIOGRAM N/A 12/28/2011   Procedure: LEFT AND RIGHT HEART CATHETERIZATION WITH CORONARY ANGIOGRAM;  Surgeon: Jolaine Artist, MD;  Location: [facility] CATH LAB;  Service: Cardiovascular;  Laterality: N/A;  . TRACHEAL SURGERY    . TRACHEOSTOMY      Family History  Problem Relation Age of Onset  . Hyperlipidemia Mother   . Hypertension Mother   . Hypertension Father   . Hyperlipidemia Father     Social History:  reports that he has quit smoking. His smoking use included Cigarettes. He has a 10.00 pack-year smoking history. He has never used smokeless tobacco. He reports that he drinks alcohol. He reports that he does not use drugs.  Allergies:  Allergies  Allergen Reactions  . Vicodin [Hydrocodone-Acetaminophen] Rash  . Other     Unknown anesthesia medicine caused cardiac arrest.  . Tylenol [Acetaminophen]     HBP -Liver Cirrhosis      Medication List       Accurate as of 12/27/15 11:59 PM. Always use your most recent med list.            amLODipine 5 MG tablet Commonly known as:  NORVASC Take 1 tablet (5 mg total) by mouth daily.   aspirin EC 81 MG tablet Take 81 mg by mouth daily.   bisoprolol 10 MG tablet Commonly known as:  ZEBETA Take 1 tablet (10 mg total) by mouth daily.   dronabinol 10 MG capsule Commonly known as:  MARINOL Take 10 mg by mouth 2 (two) times daily before a meal.   feeding supplement (JEVITY 1.5 CAL) Liqd Place 70 mL/hr into feeding tube continuous.   fentaNYL 25 MCG/HR patch Commonly known as:  DURAGESIC - dosed mcg/hr Place 1 patch (25 mcg total) onto the skin every 3 (three) days.   fluconazole 200 MG tablet Commonly  known as:  DIFLUCAN Take 200 mg by mouth daily.   free water Soln Place 175 mLs into feeding tube 4 (four) times daily.   guaiFENesin 600 MG 12 hr tablet Commonly known as:  MUCINEX Take 1 tablet (600 mg total) by mouth 2 (two) times daily.   hydrocortisone 20 MG tablet Commonly known as:  CORTEF TAKE 1 TABLET BY MOUTH TWICE DAILY   levothyroxine 125 MCG tablet Commonly known as:  SYNTHROID, LEVOTHROID Take 1 tablet (125 mcg total) by mouth daily.   lidocaine 2 % solution Commonly known as:  XYLOCAINE Use as directed 10 mLs in the mouth or throat 4 (four) times daily as needed for mouth pain.   magic mouthwash w/lidocaine Soln Take 5 mLs by mouth 4 (four) times daily.   nystatin 100000 UNIT/ML suspension Commonly known as:  MYCOSTATIN   ondansetron 4 MG tablet Commonly known as:  ZOFRAN Take 1 tablet (4 mg total) by mouth every 8 (eight) hours as needed for nausea or vomiting.   oxyCODONE 5 MG/5ML solution Commonly known as:  ROXICODONE Take 5 mLs (5 mg total) by mouth every 4 (four) hours as needed for severe pain.   Potassium Chloride ER 20 MEQ Tbcr TAKE 1 TABLET BY MOUTH DAILY       ROS  No complaints of palpitations   Examination:    BP (!) 150/100 (Patient Position: Standing)   Pulse 88   Temp 97.9 F (36.6 C)   Resp 16   Ht 5'  11" (1.803 m)   Wt 127 lb 12.8 oz (58 kg)   SpO2 94%   BMI 17.82 kg/m   Standing blood pressure 150/100 on both arms  No ankle edema   Assessments   1.  Orthostatic hypotension secondary to adrenal  insufficiency: This has resolved  His blood pressure is now consistently high This is despite staying off Florinef and interviewing his bisoprolol He had been also advised to cut back on sodium intake    2.  Adrenal insufficiency, secondary: Adequately supplemented and not symptomatic   Treatment:    1. He will start Norvasc 5 mg daily in addition to bisoprolol 2. Advised him to establish with a primary care physician for general care 3. No change in hydrocortisone    Patient Instructions  Need a Primary MD  New rx for BP    Anajulia Leyendecker 12/28/2015, 9:26 AM   Note: This office note was prepared with Estate agent. Any transcriptional errors that result from this process are unintentional.

## 2016-01-16 ENCOUNTER — Other Ambulatory Visit: Payer: Self-pay | Admitting: Endocrinology

## 2016-01-16 DIAGNOSIS — I10 Essential (primary) hypertension: Secondary | ICD-10-CM

## 2016-01-16 DIAGNOSIS — E063 Autoimmune thyroiditis: Secondary | ICD-10-CM

## 2016-01-19 ENCOUNTER — Other Ambulatory Visit (INDEPENDENT_AMBULATORY_CARE_PROVIDER_SITE_OTHER): Payer: Medicare Other

## 2016-01-19 DIAGNOSIS — E063 Autoimmune thyroiditis: Secondary | ICD-10-CM

## 2016-01-19 DIAGNOSIS — E038 Other specified hypothyroidism: Secondary | ICD-10-CM | POA: Diagnosis not present

## 2016-01-19 LAB — TSH: TSH: 0.11 u[IU]/mL — AB (ref 0.35–4.50)

## 2016-01-24 ENCOUNTER — Encounter: Payer: Self-pay | Admitting: Endocrinology

## 2016-01-24 ENCOUNTER — Ambulatory Visit (INDEPENDENT_AMBULATORY_CARE_PROVIDER_SITE_OTHER): Payer: Medicare Other | Admitting: Endocrinology

## 2016-01-24 VITALS — BP 122/84 | HR 84 | Wt 127.0 lb

## 2016-01-24 DIAGNOSIS — E063 Autoimmune thyroiditis: Secondary | ICD-10-CM

## 2016-01-24 DIAGNOSIS — I10 Essential (primary) hypertension: Secondary | ICD-10-CM | POA: Diagnosis not present

## 2016-01-24 DIAGNOSIS — E038 Other specified hypothyroidism: Secondary | ICD-10-CM | POA: Diagnosis not present

## 2016-01-24 MED ORDER — LEVOTHYROXINE SODIUM 112 MCG PO TABS: ORAL_TABLET | ORAL | 3 refills | Status: DC

## 2016-01-24 NOTE — Progress Notes (Signed)
[name], male   DOB: 1950/04/04, 65 y.o.   MRN: [id number]            Reason for Appointment: Blood pressure and  adrenal insufficiency, follow-up visit    History of Present Illness:   PROBLEM 1:    ORTHOSTATIC hypotension/hypertension: He was started back on Florinef in 1/17 when his standing blood pressure was only 84/56 but subsequently with taking 1 tablet twice a week his blood pressure went up significantly higher, was not taking any bisoprolol at this time He was given Florinef twice a week in 4/17 because of a standing blood pressure as low as 80/68 and occasional episodes of lightheadedness  However because of continued increase in blood pressure Florinef had been progressively reduced and then stopped   Subsequently his blood pressure has been consistently high He does have his blood pressure checked at home but not clear if he remembers the readings, last reading may have been about 110/70  In 10/17 because of his blood pressure being 150/100 he was started on amlodipine No lightheadedness on standing up     Lab Results  Component Value Date   CREATININE 1.12 12/22/2015   BUN 17 12/22/2015   NA 138 12/22/2015   K 4.5 12/22/2015   CL 100 12/22/2015   CO2 28 12/22/2015      ADRENAL insufficiency:   RECENT history:  He is doing well subjectively with no nausea, lightheadedness or change in appetite Consistent with taking 20 mg hydrocortisone in the morning and 10 mg the evening His weight has been consistent  Wt Readings from Last 3 Encounters:  01/24/16 127 lb (57.6 kg)  12/27/15 127 lb 12.8 oz (58 kg)  11/03/15 124 lb (56.2 kg)    Past history: His blood pressure  started getting low in 06/2014 and his antihypertensive medications were progressively tapered off Cortisol level was low during his hospitalization in 5/16 Subsequently he continued to have problems with decreased appetite, low blood pressure and weight loss He has not  had a Cortrosyn stimulation test done, since he was on hydrocortisone this was continued on increased to therapeutic levels  ACTH level when he was on low doses of hydrocortisone was relatively low at 2.9 indicating secondary adrenal insufficiency  Because of persistently low cortisol levels his PCP started him on hydrocortisone 15 mg a day in early June 2016 Subsequently continued to have problems with low blood pressure on standing up and feeling dizzy; also was having frequent near syncopal episodes and falling because of low blood pressure He was then seen for consultation here   PROBLEM 2:  Hypothyroidism was first diagnosed in 2012   The symptoms consistent with hypothyroidism  had been: fatigue, feeling sleepy but no known history of cold sensitivity, difficulty concentrating His medications have been adjusted periodically but TSH has never been normal since 2012 Not clear the reason why his TSH was significantly high in 2013 and also 2015; patient is not a good historian and difficult to communicate with him today His prescription history indicates that he has been taking anywhere between 150 up to 250 g of levothyroxine since about 2013 Earlier this year his TSH levels were in the hyperthyroid range and his dose of levothyroxine on his initial consultation was 162 g daily The patient  did complain of feeling palpitations and at times will get shaky. Since the patient's free T4 level was markedly increased at 1.95 in 09/2014 he was told to reduce the dose  to 112 g  RECENT history:  With 112 g his thyroid levels have been previously normal but TSH was 12.5 on his visit in October He had claimed to be very compliant with his medication daily However again his TSH is down below normal Does not complain of palpitations   TSH levels have been as follows:  Lab Results  Component Value Date   TSH 0.11 (L) 01/19/2016   TSH 12.25 (H) 12/22/2015   TSH 1.33 10/01/2015   TSH 1.40  07/22/2015   TSH 0.73 05/24/2015   TSH 4.84 (H) 03/23/2015   TSH 0.48 12/14/2014   TSH 0.18 (L) 11/02/2014   TSH <0.008 (L) 09/23/2014   TSH 0.078 (L) 05/29/2014    More recent thyroid levels:         Lab Results  Component Value Date   TSH 0.11 (L) 01/19/2016   TSH 12.25 (H) 12/22/2015   TSH 1.33 10/01/2015   FREET4 0.79 12/22/2015   FREET4 1.20 10/01/2015   FREET4 0.92 07/22/2015       Past Medical History:  Diagnosis Date  . Alcohol abuse   . Arthritis   . CHF (congestive heart failure) ([place])    EF 45-50% 05/2012  . Cirrhosis of liver ([place])   . Hyperlipidemia   . Hypertension   . Hypothyroidism   . Laryngeal cancer ([place]) 2011   Laryngectomy, T3N0M0  . Seizures ([place])     Past Surgical History:  Procedure Laterality Date  . ESOPHAGOGASTRODUODENOSCOPY N/A 04/10/2014   Procedure: ESOPHAGOGASTRODUODENOSCOPY (EGD);  Surgeon: Missy Sabins, MD;  Location: [facility] ENDOSCOPY;  Service: Endoscopy;  Laterality: N/A;  . LEFT AND RIGHT HEART CATHETERIZATION WITH CORONARY ANGIOGRAM N/A 12/28/2011   Procedure: LEFT AND RIGHT HEART CATHETERIZATION WITH CORONARY ANGIOGRAM;  Surgeon: Jolaine Artist, MD;  Location: [facility] CATH LAB;  Service: Cardiovascular;  Laterality: N/A;  . TRACHEAL SURGERY    . TRACHEOSTOMY      Family History  Problem Relation Age of Onset  . Hyperlipidemia Mother   . Hypertension Mother   . Hypertension Father   . Hyperlipidemia Father     Social History:  reports that he has quit smoking. His smoking use included Cigarettes. He has a 10.00 pack-year smoking history. He has never used smokeless tobacco. He reports that he drinks alcohol. He reports that he does not use drugs.  Allergies:  Allergies  Allergen Reactions  . Vicodin [Hydrocodone-Acetaminophen] Rash  . Other     Unknown anesthesia medicine caused cardiac arrest.  . Tylenol [Acetaminophen]     HBP -Liver Cirrhosis      Medication List       Accurate as of 01/24/16 12:34 PM. Always  use your most recent med list.          amLODipine 5 MG tablet Commonly known as:  NORVASC Take 1 tablet (5 mg total) by mouth daily.   aspirin EC 81 MG tablet Take 81 mg by mouth daily.   bisoprolol 10 MG tablet Commonly known as:  ZEBETA Take 1 tablet (10 mg total) by mouth daily.   dronabinol 10 MG capsule Commonly known as:  MARINOL Take 10 mg by mouth 2 (two) times daily before a meal.   feeding supplement (JEVITY 1.5 CAL) Liqd Place 70 mL/hr into feeding tube continuous.   fentaNYL 25 MCG/HR patch Commonly known as:  DURAGESIC - dosed mcg/hr Place 1 patch (25 mcg total) onto the skin every 3 (three) days.   fluconazole 200 MG tablet Commonly  known as:  DIFLUCAN Take 200 mg by mouth daily.   free water Soln Place 175 mLs into feeding tube 4 (four) times daily.   guaiFENesin 600 MG 12 hr tablet Commonly known as:  MUCINEX Take 1 tablet (600 mg total) by mouth 2 (two) times daily.   hydrocortisone 20 MG tablet Commonly known as:  CORTEF TAKE 1 TABLET BY MOUTH TWICE DAILY   levothyroxine 112 MCG tablet Commonly known as:  SYNTHROID, LEVOTHROID TAKE 1 TABLET(112 MCG) BY MOUTH DAILY BEFORE BREAKFAST   lidocaine 2 % solution Commonly known as:  XYLOCAINE Use as directed 10 mLs in the mouth or throat 4 (four) times daily as needed for mouth pain.   magic mouthwash w/lidocaine Soln Take 5 mLs by mouth 4 (four) times daily.   nystatin 100000 UNIT/ML suspension Commonly known as:  MYCOSTATIN   ondansetron 4 MG tablet Commonly known as:  ZOFRAN Take 1 tablet (4 mg total) by mouth every 8 (eight) hours as needed for nausea or vomiting.   oxyCODONE 5 MG/5ML solution Commonly known as:  ROXICODONE Take 5 mLs (5 mg total) by mouth every 4 (four) hours as needed for severe pain.   Potassium Chloride ER 20 MEQ Tbcr TAKE 1 TABLET BY MOUTH DAILY       ROS  No complaints of Leg swelling   Examination:    BP 122/84   Pulse 84   Wt 127 lb (57.6 kg)   SpO2  91%   BMI 17.71 kg/m    Repeat blood pressure 122/84 standing   Assessments   1.  Orthostatic hypotension secondary to adrenal  insufficiency: This has resolved  His blood pressure is now reflecting hypertension    2.  HYPERTENSION: With adding amlodipine to his bisoprolol his blood pressure appears to be much better without orthostasis  3.  Adrenal insufficiency, secondary: Adequately supplemented and not symptomatic  4.  As discussed above his TSH is again low even though previous TSH was 12.2 and he is only on a slightly higher dose.  Likely he may not have been compliant with his levothyroxine on his previous visit   Treatment:    1. Continue same regimen 2. Again he will try to establish with a PCP for general care and hypertension 3. Go back to 112 g of levothyroxine since TSH is again low    Patient Instructions  Go back to 112ug Levethyroxine    Athen Riel 01/24/2016, 12:34 PM   Note: This office note was prepared with Dragon voice recognition system technology. Any transcriptional errors that result from this process are unintentional.

## 2016-01-24 NOTE — Patient Instructions (Signed)
Go back to 112ug Levethyroxine

## 2016-02-03 DIAGNOSIS — R918 Other nonspecific abnormal finding of lung field: Secondary | ICD-10-CM | POA: Diagnosis not present

## 2016-02-03 DIAGNOSIS — C321 Malignant neoplasm of supraglottis: Secondary | ICD-10-CM | POA: Diagnosis not present

## 2016-02-05 ENCOUNTER — Other Ambulatory Visit: Payer: Self-pay | Admitting: Endocrinology

## 2016-03-24 ENCOUNTER — Other Ambulatory Visit: Payer: Medicare Other

## 2016-03-27 ENCOUNTER — Ambulatory Visit: Payer: Medicare Other | Admitting: Endocrinology

## 2016-04-10 ENCOUNTER — Other Ambulatory Visit (INDEPENDENT_AMBULATORY_CARE_PROVIDER_SITE_OTHER): Payer: Medicare Other

## 2016-04-10 DIAGNOSIS — I10 Essential (primary) hypertension: Secondary | ICD-10-CM

## 2016-04-10 DIAGNOSIS — E063 Autoimmune thyroiditis: Secondary | ICD-10-CM

## 2016-04-10 DIAGNOSIS — E038 Other specified hypothyroidism: Secondary | ICD-10-CM

## 2016-04-10 LAB — BASIC METABOLIC PANEL
BUN: 24 mg/dL — ABNORMAL HIGH (ref 6–23)
CALCIUM: 9.4 mg/dL (ref 8.4–10.5)
CO2: 26 mEq/L (ref 19–32)
CREATININE: 1.25 mg/dL (ref 0.40–1.50)
Chloride: 100 mEq/L (ref 96–112)
GFR: 74.88 mL/min (ref >60.00)
Glucose, Bld: 88 mg/dL (ref 70–99)
Potassium: 4.1 mEq/L (ref 3.5–5.1)
SODIUM: 134 meq/L — AB (ref 135–145)

## 2016-04-10 LAB — TSH: TSH: 0.28 u[IU]/mL — ABNORMAL LOW (ref 0.35–4.50)

## 2016-04-10 LAB — T4, FREE: FREE T4: 1.09 ng/dL (ref 0.60–1.60)

## 2016-04-11 DIAGNOSIS — H906 Mixed conductive and sensorineural hearing loss, bilateral: Secondary | ICD-10-CM | POA: Diagnosis not present

## 2016-04-11 DIAGNOSIS — H6042 Cholesteatoma of left external ear: Secondary | ICD-10-CM | POA: Diagnosis not present

## 2016-04-14 ENCOUNTER — Encounter: Payer: Self-pay | Admitting: Endocrinology

## 2016-04-14 ENCOUNTER — Ambulatory Visit (INDEPENDENT_AMBULATORY_CARE_PROVIDER_SITE_OTHER): Payer: Medicare Other | Admitting: Endocrinology

## 2016-04-14 VITALS — BP 96/56 | HR 84 | Ht 71.0 in | Wt 116.0 lb

## 2016-04-14 DIAGNOSIS — I951 Orthostatic hypotension: Secondary | ICD-10-CM

## 2016-04-14 NOTE — Patient Instructions (Signed)
Thyroid: take 6 1/2 pills per week  Stop AMLODIPINE

## 2016-04-14 NOTE — Progress Notes (Signed)
[name], male   DOB: 12-02-1950, 66 y.o.   MRN: [id number]            Reason for Appointment: Blood pressure and  adrenal insufficiency, follow-up visit    History of Present Illness:   PROBLEM 1:    ORTHOSTATIC hypotension/hypertension: He was started back on Florinef in 1/17 when his standing blood pressure was only 84/56 but subsequently with taking 1 tablet twice a week his blood pressure went up significantly higher, was not taking any bisoprolol at this time. He was again given Florinef twice a week in 4/17  However because of continued increase in blood pressure Florinef had been progressively reduced and then stopped He does have his blood pressure checked at home but not clear if he remembers the readings, last reading may have been about 110/70  In 10/17 because of his blood pressure being 150/100 he was started on amlodipine along with bisoprolol He thinks his blood pressure may be lower recently but does not complain of lightheadedness Does not remember his blood pressure readings with her checked by his daughter   Lab Results  Component Value Date   CREATININE 1.25 04/10/2016   BUN 24 (H) 04/10/2016   NA 134 (L) 04/10/2016   K 4.1 04/10/2016   CL 100 04/10/2016   CO2 26 04/10/2016      ADRENAL insufficiency:   RECENT history:  He is doing well subjectively with no nausea, lightheadedness or change in appetite However has difficulty swallowing and is losing weight now  Consistent with taking 20 mg hydrocortisone in the morning and 10 mg the evening  Wt Readings from Last 3 Encounters:  04/14/16 116 lb (52.6 kg)  01/24/16 127 lb (57.6 kg)  12/27/15 127 lb 12.8 oz (58 kg)    Background history: His blood pressure  started getting low in 06/2014 and his antihypertensive medications were progressively tapered off Cortisol level was low during his hospitalization in 5/16 Subsequently he continued to have problems with decreased appetite,  low blood pressure and weight loss He has not had a Cortrosyn stimulation test done, since he was on hydrocortisone this was continued on increased to therapeutic levels  ACTH level when he was on low doses of hydrocortisone was relatively low at 2.9 indicating secondary adrenal insufficiency  Because of persistently low cortisol levels his PCP started him on hydrocortisone 15 mg a day in early June 2016 Subsequently continued to have problems with low blood pressure on standing up and feeling dizzy; also was having frequent near syncopal episodes and falling because of low blood pressure He was then seen for consultation here   PROBLEM 2:  Hypothyroidism was first diagnosed in 2012   The symptoms consistent with hypothyroidism  had been: fatigue, feeling sleepy but no known history of cold sensitivity, difficulty concentrating His medications have been adjusted periodically but TSH has never been normal since 2012 Not clear the reason why his TSH was significantly high in 2013 and also 2015; patient is not a good historian and difficult to communicate with him today His prescription history indicates that he has been taking anywhere between 150 up to 250 g of levothyroxine since about 2013 Earlier this year his TSH levels were in the hyperthyroid range and his dose of levothyroxine on his initial consultation was 162 g daily The patient  did complain of feeling palpitations and at times will get shaky. Since the patient's free T4 level was markedly increased at 1.95 in 09/2014  he was told to reduce the dose to 112 g  RECENT history:  With 112 g his thyroid levels had been previously normal but TSH has been inconsistent in 2017 Subsequently needed 125 g and recently back again on 112 g  However again his TSH is below normal with continuing to 112 g Does not complain of palpitations or shakiness, has lost weight   TSH levels have been as follows:  Lab Results  Component Value  Date   TSH 0.28 (L) 04/10/2016   TSH 0.11 (L) 01/19/2016   TSH 12.25 (H) 12/22/2015   TSH 1.33 10/01/2015   TSH 1.40 07/22/2015   TSH 0.73 05/24/2015   TSH 4.84 (H) 03/23/2015   TSH 0.48 12/14/2014   TSH 0.18 (L) 11/02/2014   TSH <0.008 (L) 09/23/2014    More recent thyroid levels:         Lab Results  Component Value Date   TSH 0.28 (L) 04/10/2016   TSH 0.11 (L) 01/19/2016   TSH 12.25 (H) 12/22/2015   FREET4 1.09 04/10/2016   FREET4 0.79 12/22/2015   FREET4 1.20 10/01/2015       Past Medical History:  Diagnosis Date  . Alcohol abuse   . Arthritis   . CHF (congestive heart failure) ([place])    EF 45-50% 05/2012  . Cirrhosis of liver ([place])   . Hyperlipidemia   . Hypertension   . Hypothyroidism   . Laryngeal cancer ([place]) 2011   Laryngectomy, T3N0M0  . Seizures ([place])     Past Surgical History:  Procedure Laterality Date  . ESOPHAGOGASTRODUODENOSCOPY N/A 04/10/2014   Procedure: ESOPHAGOGASTRODUODENOSCOPY (EGD);  Surgeon: Missy Sabins, MD;  Location[facility] ENDOSCOPY;  Service: Endoscopy;  Laterality: N/A;  . LEFT AND RIGHT HEART CATHETERIZATION WITH CORONARY ANGIOGRAM N/A 12/28/2011   Procedure: LEFT AND RIGHT HEART CATHETERIZATION WITH CORONARY ANGIOGRAM;  Surgeon: Jolaine Artist, MD;  Location: [facility] CATH LAB;  Service: Cardiovascular;  Laterality: N/A;  . TRACHEAL SURGERY    . TRACHEOSTOMY      Family History  Problem Relation Age of Onset  . Hyperlipidemia Mother   . Hypertension Mother   . Hypertension Father   . Hyperlipidemia Father     Social History:  reports that he has quit smoking. His smoking use included Cigarettes. He has a 10.00 pack-year smoking history. He has never used smokeless tobacco. He reports that he drinks alcohol. He reports that he does not use drugs.  Allergies:  Allergies  Allergen Reactions  . Vicodin [Hydrocodone-Acetaminophen] Rash  . Other     Unknown anesthesia medicine caused cardiac arrest.  . Tylenol [Acetaminophen]      HBP -Liver Cirrhosis    Allergies as of 04/14/2016      Reactions   Vicodin [hydrocodone-acetaminophen] Rash   Other    Unknown anesthesia medicine caused cardiac arrest.   Tylenol [acetaminophen]    HBP -Liver Cirrhosis      Medication List       Accurate as of 04/14/16 11:59 PM. Always use your most recent med list.          amLODipine 5 MG tablet Commonly known as:  NORVASC Take 1 tablet (5 mg total) by mouth daily.   aspirin EC 81 MG tablet Take 81 mg by mouth daily.   bisoprolol 10 MG tablet Commonly known as:  ZEBETA Take 1 tablet (10 mg total) by mouth daily.   dronabinol 10 MG capsule Commonly known as:  MARINOL Take 10 mg by mouth  2 (two) times daily before a meal.   feeding supplement (JEVITY 1.5 CAL) Liqd Place 70 mL/hr into feeding tube continuous.   fentaNYL 25 MCG/HR patch Commonly known as:  DURAGESIC - dosed mcg/hr Place 1 patch (25 mcg total) onto the skin every 3 (three) days.   fluconazole 200 MG tablet Commonly known as:  DIFLUCAN Take 200 mg by mouth daily.   free water Soln Place 175 mLs into feeding tube 4 (four) times daily.   guaiFENesin 600 MG 12 hr tablet Commonly known as:  MUCINEX Take 1 tablet (600 mg total) by mouth 2 (two) times daily.   hydrocortisone 20 MG tablet Commonly known as:  CORTEF TAKE 1 TABLET BY MOUTH TWICE DAILY   levothyroxine 112 MCG tablet Commonly known as:  SYNTHROID, LEVOTHROID TAKE 1 TABLET(112 MCG) BY MOUTH DAILY BEFORE BREAKFAST   lidocaine 2 % solution Commonly known as:  XYLOCAINE Use as directed 10 mLs in the mouth or throat 4 (four) times daily as needed for mouth pain.   magic mouthwash w/lidocaine Soln Take 5 mLs by mouth 4 (four) times daily.   nystatin 100000 UNIT/ML suspension Commonly known as:  MYCOSTATIN   ondansetron 4 MG tablet Commonly known as:  ZOFRAN Take 1 tablet (4 mg total) by mouth every 8 (eight) hours as needed for nausea or vomiting.   oxyCODONE 5 MG/5ML  solution Commonly known as:  ROXICODONE Take 5 mLs (5 mg total) by mouth every 4 (four) hours as needed for severe pain.   Potassium Chloride ER 20 MEQ Tbcr TAKE 1 TABLET BY MOUTH DAILY       ROS      Examination:    BP (!) 96/56 (BP Location: Right Arm, Cuff Size: Normal)   Pulse 84   Ht 5\' 11"  (1.803 m)   Wt 116 lb (52.6 kg)   SpO2 97%   BMI 16.18 kg/m    Repeat blood pressure 122/84 standing   Assessments   1.  Orthostatic hypotension secondary to adrenal  insufficiency: This has Recurred possibly because of his weight loss However does not have any other symptoms of adrenal insufficiency and not symptomatic   2.  HYPERTENSION: Recently has been on amlodipine in addition to his bisoprolol Does monitor blood pressure at home occasionally but not clear what his readings are  3.  Adrenal insufficiency, secondary: Adequately supplemented, his weight loss and change in blood pressure is probably unrelated as he is compliant with his hydrocortisone  4.  As discussed above his TSH is again low even with reducing his dose down to 112 g Currently asymptomatic   Treatment:    1. For now will just stop his amlodipine and recheck blood pressure in 3 weeks 2. He will check blood pressure closely at home and call if consistently lower 3. Continue same regimen of hydrocortisone 4. Go down to 6-1/2 tablets per week of the 112 g of levothyroxine since TSH is again low. 5. Ensure marbles to help his nutrition when he is not able to eat or swallow well 6. Follow-up with PCP if continuing to lose weight    Patient Instructions  Thyroid: take 6 1/2 pills per week  Stop AMLODIPINE    Kerrianne Jeng 04/16/2016, 11:00 AM   Note: This office note was prepared with Estate agent. Any transcriptional errors that result from this process are unintentional.

## 2016-05-11 ENCOUNTER — Ambulatory Visit (INDEPENDENT_AMBULATORY_CARE_PROVIDER_SITE_OTHER): Payer: Medicare Other | Admitting: Endocrinology

## 2016-05-11 ENCOUNTER — Encounter: Payer: Self-pay | Admitting: Endocrinology

## 2016-05-11 VITALS — BP 160/110 | HR 83 | Ht 71.0 in | Wt 123.0 lb

## 2016-05-11 DIAGNOSIS — I951 Orthostatic hypotension: Secondary | ICD-10-CM | POA: Diagnosis not present

## 2016-05-11 DIAGNOSIS — E038 Other specified hypothyroidism: Secondary | ICD-10-CM | POA: Diagnosis not present

## 2016-05-11 DIAGNOSIS — E063 Autoimmune thyroiditis: Secondary | ICD-10-CM

## 2016-05-11 NOTE — Progress Notes (Signed)
[name], male   DOB: 1950/11/09, 66 y.o.   MRN: [id number]            Reason for Appointment: Blood pressure and  adrenal insufficiency, follow-up visit    History of Present Illness:   PROBLEM 1:    ORTHOSTATIC hypotension/hypertension: He was started on Florinef in 1/17 when his standing blood pressure was only 84/56 but subsequently with taking 1 tablet twice a week his blood pressure went up significantly higher, was not taking any bisoprolol at this time. He was again given Florinef twice a week in 4/17  However because of continued increase in blood pressure Florinef had been progressively reduced and then stopped He does have his blood pressure checked at home but not clear if he remembers the readings, last reading may have been about 110/70  In 10/17 because of his blood pressure being 150/100 he was started on amlodipine along with bisoprolol  However in 04/2016 he had lost 11 pounds and his blood pressure was below 884 systolic He was told to stop his amlodipine then He thinks his blood pressure has been about 140-150/90 at home but he did not report his high readings  No complaints of headaches  Lab Results  Component Value Date   CREATININE 1.25 04/10/2016   BUN 24 (H) 04/10/2016   NA 134 (L) 04/10/2016   K 4.1 04/10/2016   CL 100 04/10/2016   CO2 26 04/10/2016      ADRENAL insufficiency:   RECENT history:  He is doing well subjectively with no nausea, lightheadedness or change in appetite Recently has been able to eat better and his weight has improved significantly over the last month  Consistent with taking 20 mg hydrocortisone in the morning and 10 mg the evening  Wt Readings from Last 3 Encounters:  05/11/16 123 lb (55.8 kg)  04/14/16 116 lb (52.6 kg)  01/24/16 127 lb (57.6 kg)    Background history: His blood pressure  started getting low in 06/2014 and his antihypertensive medications were progressively tapered off Cortisol  level was low during his hospitalization in 5/16 Subsequently he continued to have problems with decreased appetite, low blood pressure and weight loss He has not had a Cortrosyn stimulation test done, since he was on hydrocortisone this was continued on increased to therapeutic levels  ACTH level when he was on low doses of hydrocortisone was relatively low at 2.9 indicating secondary adrenal insufficiency  Because of persistently low cortisol levels his PCP started him on hydrocortisone 15 mg a day in early June 2016 Subsequently continued to have problems with low blood pressure on standing up and feeling dizzy; also was having frequent near syncopal episodes and falling because of low blood pressure He was then seen for consultation here   PROBLEM 2:  Hypothyroidism was first diagnosed in 2012   The symptoms consistent with hypothyroidism  had been: fatigue, feeling sleepy but no known history of cold sensitivity, difficulty concentrating His medications have been adjusted periodically but TSH has never been normal since 2012 Not clear the reason why his TSH was significantly high in 2013 and also 2015; patient is not a good historian and difficult to communicate with him today His prescription history indicates that he has been taking anywhere between 150 up to 250 g of levothyroxine since about 2013 Earlier this year his TSH levels were in the hyperthyroid range and his dose of levothyroxine on his initial consultation was 162 g daily The patient  did complain of feeling palpitations and at times will get shaky. Since the patient's free T4 level was markedly increased at 1.95 in 09/2014 he was told to reduce the dose to 112 g  RECENT history:  His thyroxine requirement has been somewhat variable since 10/17 Even with reducing his doses his TSH has been low the last 2 times Now taking 6-1/2 tablets weekly of the 112 g since 2/80 He feels good with no unusual fatigue His weight has  been fluctuating   TSH levels have been as follows:  Lab Results  Component Value Date   TSH 0.28 (L) 04/10/2016   TSH 0.11 (L) 01/19/2016   TSH 12.25 (H) 12/22/2015   TSH 1.33 10/01/2015   TSH 1.40 07/22/2015   TSH 0.73 05/24/2015   TSH 4.84 (H) 03/23/2015   TSH 0.48 12/14/2014   TSH 0.18 (L) 11/02/2014   TSH <0.008 (L) 09/23/2014    More recent thyroid levels:         Lab Results  Component Value Date   TSH 0.28 (L) 04/10/2016   TSH 0.11 (L) 01/19/2016   TSH 12.25 (H) 12/22/2015   FREET4 1.09 04/10/2016   FREET4 0.79 12/22/2015   FREET4 1.20 10/01/2015       Past Medical History:  Diagnosis Date  . Alcohol abuse   . Arthritis   . CHF (congestive heart failure) ([place])    EF 45-50% 05/2012  . Cirrhosis of liver ([place])   . Hyperlipidemia   . Hypertension   . Hypothyroidism   . Laryngeal cancer ([place]) 2011   Laryngectomy, T3N0M0  . Seizures ([place])     Past Surgical History:  Procedure Laterality Date  . ESOPHAGOGASTRODUODENOSCOPY N/A 04/10/2014   Procedure: ESOPHAGOGASTRODUODENOSCOPY (EGD);  Surgeon: Missy Sabins, MD;  Location: [facility] ENDOSCOPY;  Service: Endoscopy;  Laterality: N/A;  . LEFT AND RIGHT HEART CATHETERIZATION WITH CORONARY ANGIOGRAM N/A 12/28/2011   Procedure: LEFT AND RIGHT HEART CATHETERIZATION WITH CORONARY ANGIOGRAM;  Surgeon: Jolaine Artist, MD;  [facility] CATH LAB;  Service: Cardiovascular;  Laterality: N/A;  . TRACHEAL SURGERY    . TRACHEOSTOMY      Family History  Problem Relation Age of Onset  . Hyperlipidemia Mother   . Hypertension Mother   . Hypertension Father   . Hyperlipidemia Father     Social History:  reports that he has quit smoking. His smoking use included Cigarettes. He has a 10.00 pack-year smoking history. He has never used smokeless tobacco. He reports that he drinks alcohol. He reports that he does not use drugs.  Allergies:  Allergies  Allergen Reactions  . Vicodin [Hydrocodone-Acetaminophen] Rash  . Other      Unknown anesthesia medicine caused cardiac arrest.  . Tylenol [Acetaminophen]     HBP -Liver Cirrhosis    Allergies as of 05/11/2016      Reactions   Vicodin [hydrocodone-acetaminophen] Rash   Other    Unknown anesthesia medicine caused cardiac arrest.   Tylenol [acetaminophen]    HBP -Liver Cirrhosis      Medication List       Accurate as of 05/11/16  1:32 PM. Always use your most recent med list.          amLODipine 5 MG tablet Commonly known as:  NORVASC Take 1 tablet (5 mg total) by mouth daily.   aspirin EC 81 MG tablet Take 81 mg by mouth daily.   bisoprolol 10 MG tablet Commonly known as:  ZEBETA Take 1 tablet (10 mg  total) by mouth daily.   dronabinol 10 MG capsule Commonly known as:  MARINOL Take 10 mg by mouth 2 (two) times daily before a meal.   feeding supplement (JEVITY 1.5 CAL) Liqd Place 70 mL/hr into feeding tube continuous.   fentaNYL 25 MCG/HR patch Commonly known as:  DURAGESIC - dosed mcg/hr Place 1 patch (25 mcg total) onto the skin every 3 (three) days.   fluconazole 200 MG tablet Commonly known as:  DIFLUCAN Take 200 mg by mouth daily.   free water Soln Place 175 mLs into feeding tube 4 (four) times daily.   guaiFENesin 600 MG 12 hr tablet Commonly known as:  MUCINEX Take 1 tablet (600 mg total) by mouth 2 (two) times daily.   hydrocortisone 20 MG tablet Commonly known as:  CORTEF TAKE 1 TABLET BY MOUTH TWICE DAILY   levothyroxine 112 MCG tablet Commonly known as:  SYNTHROID, LEVOTHROID TAKE 1 TABLET(112 MCG) BY MOUTH DAILY BEFORE BREAKFAST   lidocaine 2 % solution Commonly known as:  XYLOCAINE Use as directed 10 mLs in the mouth or throat 4 (four) times daily as needed for mouth pain.   magic mouthwash w/lidocaine Soln Take 5 mLs by mouth 4 (four) times daily.   nystatin 100000 UNIT/ML suspension Commonly known as:  MYCOSTATIN   ondansetron 4 MG tablet Commonly known as:  ZOFRAN Take 1 tablet (4 mg total) by mouth  every 8 (eight) hours as needed for nausea or vomiting.   oxyCODONE 5 MG/5ML solution Commonly known as:  ROXICODONE Take 5 mLs (5 mg total) by mouth every 4 (four) hours as needed for severe pain.   Potassium Chloride ER 20 MEQ Tbcr TAKE 1 TABLET BY MOUTH DAILY       ROS      Examination:    BP (!) 160/110 (BP Location: Right Arm, Cuff Size: Normal)   Pulse 83   Ht 5\' 11"  (1.803 m)   Wt 123 lb (55.8 kg)   SpO2 97%   BMI 17.16 kg/m    Repeat blood pressure 140/100 standing   Assessments   1.  Orthostatic hypotension secondary to adrenal  insufficiency:  His blood pressure has been inconsistent and even though he was having relatively low blood pressure reading below 704 systolic on the last visit secondary to his weight loss his blood pressure is now significantly high again with stopping amlodipine    2.  HYPERTENSION: Recently has been on only bisoprolol  3.  Adrenal insufficiency, secondary: Adequately supplemented, his weight loss has reversed  4.  As discussed above his TSH last month was low and he is now taking 6-1/2 tablets of the 112 g    Treatment:    1. For now will restart his amlodipine and recheck blood pressure in 6 weeks 2. He will check blood pressure  at home and call if consistently abnormal 3. Continue same regimen of hydrocortisone 4. Recheck thyroid on the next visit    Patient Instructions  Restart amlodipine    Cherylynn Liszewski 05/11/2016, 1:32 PM   Note: This office note was prepared with Estate agent. Any transcriptional errors that result from this process are unintentional.

## 2016-05-11 NOTE — Patient Instructions (Signed)
Restart amlodipine

## 2016-05-22 DIAGNOSIS — M879 Osteonecrosis, unspecified: Secondary | ICD-10-CM | POA: Diagnosis not present

## 2016-05-22 DIAGNOSIS — H906 Mixed conductive and sensorineural hearing loss, bilateral: Secondary | ICD-10-CM | POA: Diagnosis not present

## 2016-05-22 DIAGNOSIS — H6042 Cholesteatoma of left external ear: Secondary | ICD-10-CM | POA: Diagnosis not present

## 2016-05-24 ENCOUNTER — Encounter (HOSPITAL_COMMUNITY): Payer: Self-pay

## 2016-05-24 ENCOUNTER — Emergency Department (HOSPITAL_COMMUNITY)
Admission: EM | Admit: 2016-05-24 | Discharge: 2016-05-24 | Disposition: A | Discharge status: Home / Self Care | Payer: Medicare Other | Attending: Emergency Medicine | Admitting: Emergency Medicine

## 2016-05-24 DIAGNOSIS — I110 Hypertensive heart disease with heart failure: Secondary | ICD-10-CM | POA: Insufficient documentation

## 2016-05-24 DIAGNOSIS — Z4659 Encounter for fitting and adjustment of other gastrointestinal appliance and device: Secondary | ICD-10-CM

## 2016-05-24 DIAGNOSIS — Z8521 Personal history of malignant neoplasm of larynx: Secondary | ICD-10-CM | POA: Insufficient documentation

## 2016-05-24 DIAGNOSIS — Y829 Unspecified medical devices associated with adverse incidents: Secondary | ICD-10-CM | POA: Diagnosis not present

## 2016-05-24 DIAGNOSIS — T85518A Breakdown (mechanical) of other gastrointestinal prosthetic devices, implants and grafts, initial encounter: Secondary | ICD-10-CM | POA: Insufficient documentation

## 2016-05-24 DIAGNOSIS — Z79899 Other long term (current) drug therapy: Secondary | ICD-10-CM | POA: Insufficient documentation

## 2016-05-24 DIAGNOSIS — E039 Hypothyroidism, unspecified: Secondary | ICD-10-CM | POA: Insufficient documentation

## 2016-05-24 DIAGNOSIS — Z431 Encounter for attention to gastrostomy: Secondary | ICD-10-CM | POA: Diagnosis not present

## 2016-05-24 DIAGNOSIS — I5022 Chronic systolic (congestive) heart failure: Secondary | ICD-10-CM | POA: Insufficient documentation

## 2016-05-24 DIAGNOSIS — Z7982 Long term (current) use of aspirin: Secondary | ICD-10-CM | POA: Insufficient documentation
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
## 2016-05-24 NOTE — Discharge Instructions (Signed)
PEG tube was replaced with a Foley catheter as we did not have the correct size in the emergency department readily available, please call your physician to make up arrangements to have your [place] button replaced ask for a second tube for replacement as an emergency backup that you can bring with you.  If this happens again.  He can resume normal activities.

## 2016-05-30 DIAGNOSIS — H906 Mixed conductive and sensorineural hearing loss, bilateral: Secondary | ICD-10-CM | POA: Diagnosis not present

## 2016-05-30 DIAGNOSIS — H6042 Cholesteatoma of left external ear: Secondary | ICD-10-CM | POA: Diagnosis not present

## 2016-05-30 DIAGNOSIS — M879 Osteonecrosis, unspecified: Secondary | ICD-10-CM | POA: Diagnosis not present

## 2016-06-02 ENCOUNTER — Other Ambulatory Visit: Payer: Self-pay | Admitting: Endocrinology

## 2016-06-16 ENCOUNTER — Other Ambulatory Visit: Payer: Self-pay | Admitting: Endocrinology

## 2016-06-19 ENCOUNTER — Other Ambulatory Visit: Payer: Medicare Other

## 2016-06-20 ENCOUNTER — Other Ambulatory Visit (INDEPENDENT_AMBULATORY_CARE_PROVIDER_SITE_OTHER): Payer: Medicare Other

## 2016-06-20 DIAGNOSIS — I951 Orthostatic hypotension: Secondary | ICD-10-CM

## 2016-06-20 DIAGNOSIS — E063 Autoimmune thyroiditis: Secondary | ICD-10-CM | POA: Diagnosis not present

## 2016-06-20 LAB — BASIC METABOLIC PANEL
BUN: 12 mg/dL (ref 6–23)
CO2: 26 meq/L (ref 19–32)
Calcium: 9.5 mg/dL (ref 8.4–10.5)
Chloride: 102 mEq/L (ref 96–112)
Creatinine, Ser: 1.17 mg/dL (ref 0.40–1.50)
GFR: 80.77 mL/min (ref >60.00)
GLUCOSE: 107 mg/dL — AB (ref 70–99)
POTASSIUM: 3.8 meq/L (ref 3.5–5.1)
SODIUM: 137 meq/L (ref 135–145)

## 2016-06-20 LAB — TSH: TSH: 18.1 u[IU]/mL — ABNORMAL HIGH (ref 0.35–4.50)

## 2016-06-21 LAB — T4, FREE: Free T4: 0.68 ng/dL (ref 0.60–1.60)

## 2016-06-22 ENCOUNTER — Ambulatory Visit: Payer: Medicare Other | Admitting: Endocrinology

## 2016-06-23 NOTE — Progress Notes (Signed)
Please reschedule missed appointment 

## 2016-07-04 ENCOUNTER — Encounter (HOSPITAL_COMMUNITY): Payer: Self-pay | Admitting: Emergency Medicine

## 2016-07-04 ENCOUNTER — Emergency Department (HOSPITAL_COMMUNITY): Payer: Medicare Other

## 2016-07-04 ENCOUNTER — Inpatient Hospital Stay (HOSPITAL_COMMUNITY)
Admission: EM | Admit: 2016-07-04 | Discharge: 2016-07-07 | DRG: 872 | Disposition: A | Discharge status: Home / Self Care | Payer: Medicare Other | Attending: Family Medicine | Admitting: Family Medicine

## 2016-07-04 DIAGNOSIS — Z87891 Personal history of nicotine dependence: Secondary | ICD-10-CM

## 2016-07-04 DIAGNOSIS — R509 Fever, unspecified: Secondary | ICD-10-CM | POA: Diagnosis not present

## 2016-07-04 DIAGNOSIS — I509 Heart failure, unspecified: Secondary | ICD-10-CM | POA: Diagnosis present

## 2016-07-04 DIAGNOSIS — Z888 Allergy status to other drugs, medicaments and biological substances status: Secondary | ICD-10-CM

## 2016-07-04 DIAGNOSIS — Z79899 Other long term (current) drug therapy: Secondary | ICD-10-CM

## 2016-07-04 DIAGNOSIS — Z8521 Personal history of malignant neoplasm of larynx: Secondary | ICD-10-CM

## 2016-07-04 DIAGNOSIS — N179 Acute kidney failure, unspecified: Secondary | ICD-10-CM | POA: Diagnosis present

## 2016-07-04 DIAGNOSIS — F1010 Alcohol abuse, uncomplicated: Secondary | ICD-10-CM | POA: Diagnosis present

## 2016-07-04 DIAGNOSIS — A419 Sepsis, unspecified organism: Principal | ICD-10-CM | POA: Diagnosis present

## 2016-07-04 DIAGNOSIS — I959 Hypotension, unspecified: Secondary | ICD-10-CM | POA: Diagnosis present

## 2016-07-04 DIAGNOSIS — E271 Primary adrenocortical insufficiency: Secondary | ICD-10-CM | POA: Diagnosis present

## 2016-07-04 DIAGNOSIS — R569 Unspecified convulsions: Secondary | ICD-10-CM | POA: Diagnosis present

## 2016-07-04 DIAGNOSIS — E876 Hypokalemia: Secondary | ICD-10-CM | POA: Diagnosis present

## 2016-07-04 DIAGNOSIS — Z7982 Long term (current) use of aspirin: Secondary | ICD-10-CM

## 2016-07-04 DIAGNOSIS — Z85819 Personal history of malignant neoplasm of unspecified site of lip, oral cavity, and pharynx: Secondary | ICD-10-CM

## 2016-07-04 DIAGNOSIS — Z885 Allergy status to narcotic agent status: Secondary | ICD-10-CM

## 2016-07-04 DIAGNOSIS — R001 Bradycardia, unspecified: Secondary | ICD-10-CM | POA: Diagnosis present

## 2016-07-04 DIAGNOSIS — Z930 Tracheostomy status: Secondary | ICD-10-CM

## 2016-07-04 DIAGNOSIS — Z79891 Long term (current) use of opiate analgesic: Secondary | ICD-10-CM

## 2016-07-04 DIAGNOSIS — A46 Erysipelas: Secondary | ICD-10-CM | POA: Diagnosis present

## 2016-07-04 DIAGNOSIS — E785 Hyperlipidemia, unspecified: Secondary | ICD-10-CM | POA: Diagnosis present

## 2016-07-04 DIAGNOSIS — I110 Hypertensive heart disease with heart failure: Secondary | ICD-10-CM | POA: Diagnosis present

## 2016-07-04 DIAGNOSIS — Z8249 Family history of ischemic heart disease and other diseases of the circulatory system: Secondary | ICD-10-CM

## 2016-07-04 DIAGNOSIS — E039 Hypothyroidism, unspecified: Secondary | ICD-10-CM | POA: Diagnosis present

## 2016-07-04 DIAGNOSIS — Z931 Gastrostomy status: Secondary | ICD-10-CM

## 2016-07-04 DIAGNOSIS — C321 Malignant neoplasm of supraglottis: Secondary | ICD-10-CM | POA: Diagnosis present

## 2016-07-04 DIAGNOSIS — K7460 Unspecified cirrhosis of liver: Secondary | ICD-10-CM | POA: Diagnosis present

## 2016-07-04 DIAGNOSIS — I10 Essential (primary) hypertension: Secondary | ICD-10-CM | POA: Diagnosis present

## 2016-07-04 LAB — CBC WITH DIFFERENTIAL/PLATELET
BASOS ABS: 0 10*3/uL (ref 0.0–0.1)
Basophils Relative: 0 %
EOS ABS: 0 10*3/uL (ref 0.0–0.7)
EOS PCT: 0 %
HCT: 44.6 % (ref 39.0–52.0)
Hemoglobin: 14.9 g/dL (ref 13.0–17.0)
Lymphocytes Relative: 17 %
Lymphs Abs: 1.3 10*3/uL (ref 0.7–4.0)
MCH: 31.4 pg (ref 26.0–34.0)
MCHC: 33.4 g/dL (ref 30.0–36.0)
MCV: 94.1 fL (ref 78.0–100.0)
Monocytes Absolute: 0.6 10*3/uL (ref 0.1–1.0)
Monocytes Relative: 8 %
Neutro Abs: 5.8 10*3/uL (ref 1.7–7.7)
Neutrophils Relative %: 75 %
PLATELETS: 168 10*3/uL (ref 150–400)
RBC: 4.74 MIL/uL (ref 4.22–5.81)
RDW: 17.1 % — ABNORMAL HIGH (ref 11.5–15.5)
WBC: 7.8 10*3/uL (ref 4.0–10.5)

## 2016-07-04 LAB — I-STAT CG4 LACTIC ACID, ED: Lactic Acid, Venous: 4.13 mmol/L (ref 0.5–1.9)

## 2016-07-04 MED ORDER — CLINDAMYCIN PHOSPHATE 600 MG/50ML IV SOLN
600.0000 mg | Freq: Once | INTRAVENOUS | Status: AC
  Administered 2016-07-05: 600 mg via INTRAVENOUS
  Filled 2016-07-04: qty 50

## 2016-07-04 MED ORDER — SODIUM CHLORIDE 0.9 % IV BOLUS (SEPSIS)
1000.0000 mL | Freq: Once | INTRAVENOUS | Status: AC
  Administered 2016-07-04: 1000 mL via INTRAVENOUS

## 2016-07-04 NOTE — ED Provider Notes (Signed)
[facility] DEPT Provider Note: Georgena Spurling, MD, FACEP  By signing my name below, I, Charolotte Eke, attest that this documentation has been prepared under the direction and in the presence of Shanon Rosser, MD. Electronically Signed: Charolotte Eke, Scribe. 07/04/16. 11:27 PM.  CSN: [id number] MRN: [id number] ARRIVAL: 07/04/16 at 2201 ROOM: WA11/WA11   CHIEF COMPLAINT  Fever   HISTORY OF PRESENT ILLNESS  [name] is a 66 y.o. male, with h/o HTN, CHF, cirrhosis of liver, hypothyroidism, HLD, Laryngeal CA who presents to the Emergency Department complaining of right sided facial swelling and erythema that began this morning. Onset was gradual. Symptoms are moderate. Pain is worse with palpation or movement.   Pt's daughter has given him several doses of Benadryl today without significant relief. She has also used antibiotic cream. Pt has associated lip numbness and numbness to the back right side of his throat (the affected side of his face) along with fever to 102.9 noted on arrival. Daughter reports that he was dry heaving in the waiting room and he became unresponsive and diaphoretic. He was noted to be significantly hypotensive and bradycardic in triage but this has improved with resolution of his syncopal episode. He continues to be tachypneic with shallow respirations.  The patient's daughter is the chief historian as he communicates poorly with his tracheostomy.   Past Medical History:  Diagnosis Date  . Alcohol abuse   . Arthritis   . CHF (congestive heart failure) ([place])    EF 45-50% 05/2012  . Cirrhosis of liver ([place])   . Hyperlipidemia   . Hypertension   . Hypothyroidism   . Laryngeal cancer ([place]) 2011   Laryngectomy, T3N0M0  . Seizures ([place])     Past Surgical History:  Procedure Laterality Date  . ESOPHAGOGASTRODUODENOSCOPY N/A 04/10/2014   Procedure: ESOPHAGOGASTRODUODENOSCOPY (EGD);  Surgeon: Missy Sabins, MD;  Location: [facility] ENDOSCOPY;  Service: Endoscopy;   Laterality: N/A;  . LEFT AND RIGHT HEART CATHETERIZATION WITH CORONARY ANGIOGRAM N/A 12/28/2011   Procedure: LEFT AND RIGHT HEART CATHETERIZATION WITH CORONARY ANGIOGRAM;  Surgeon: Jolaine Artist, MD;  Location: [facility] CATH LAB;  Service: Cardiovascular;  Laterality: N/A;  . TRACHEAL SURGERY    . TRACHEOSTOMY      Family History  Problem Relation Age of Onset  . Hyperlipidemia Mother   . Hypertension Mother   . Hypertension Father   . Hyperlipidemia Father     Social History  Substance Use Topics  . Smoking status: Former Smoker    Packs/day: 0.20    Years: 50.00    Types: Cigarettes  . Smokeless tobacco: Never Used  . Alcohol use Yes     Comment: "rarely"    Prior to Admission medications   Medication Sig Start Date End Date Taking? Authorizing Provider  Alum & Mag Hydroxide-Simeth (MAGIC MOUTHWASH W/LIDOCAINE) SOLN Take 5 mLs by mouth 4 (four) times daily. 05/07/14  Yes Frazier Richards, MD  amLODipine (NORVASC) 5 MG tablet Take 1 tablet (5 mg total) by mouth daily. 12/27/15  Yes Elayne Snare, MD  aspirin EC 81 MG tablet Take 81 mg by mouth daily. 03/12/14  Yes Historical Provider, MD  bisoprolol (ZEBETA) 10 MG tablet TAKE 1 TABLET(10 MG) BY MOUTH DAILY 06/03/16  Yes Elayne Snare, MD  fluconazole (DIFLUCAN) 200 MG tablet Take 200 mg by mouth daily as needed (thrush).  07/18/14  Yes Historical Provider, MD  guaiFENesin (MUCINEX) 600 MG 12 hr tablet Take 1 tablet (600 mg total) by mouth 2 (  two) times daily. 05/26/14  Yes Renee A Kuneff, DO  lidocaine (XYLOCAINE) 2 % solution Use as directed 10 mLs in the mouth or throat 4 (four) times daily as needed for mouth pain.  07/09/14  Yes Historical Provider, MD  Nutritional Supplements (FEEDING SUPPLEMENT, JEVITY 1.5 CAL,) LIQD Place 70 mL/hr into feeding tube continuous.   Yes Historical Provider, MD  ondansetron (ZOFRAN) 4 MG tablet Take 1 tablet (4 mg total) by mouth every 8 (eight) hours as needed for nausea or vomiting. 05/14/13  Yes Leone Brand,  MD  oxyCODONE (ROXICODONE) 5 MG/5ML solution Take 5 mLs (5 mg total) by mouth every 4 (four) hours as needed for severe pain. 05/07/14  Yes Frazier Richards, MD  Potassium Chloride ER 20 MEQ TBCR TAKE 1 TABLET BY MOUTH DAILY 10/07/15  Yes Elayne Snare, MD  levothyroxine (SYNTHROID, LEVOTHROID) 112 MCG tablet TAKE 1 TABLET(112 MCG) BY MOUTH DAILY BEFORE BREAKFAST 07/05/16   Shanon Rosser, MD  Water For Irrigation, Sterile (FREE WATER) SOLN Place 175 mLs into feeding tube 4 (four) times daily. 05/07/14   Frazier Richards, MD    Allergies Vicodin [hydrocodone-acetaminophen]; Other; and Tylenol [acetaminophen]   REVIEW OF SYSTEMS  Negative except as noted here or in the History of Present Illness.   PHYSICAL EXAMINATION  Initial Vital Signs Blood pressure (!) 78/55, pulse (!) 37, temperature (!) 102.9 F (39.4 C), temperature source Oral, resp. rate (!) 32, height 5\' 10"  (1.778 m), weight 140 lb (63.5 kg), SpO2 100 %.  Examination General: Well-developed, well-nourished male in no acute distress; appearance consistent with age of record HENT: normocephalic; atraumatic; erythema, swelling and warmth of the right cheek and chin; edentulous Eyes: pupils equal, round and reactive to light; extraocular muscles intact; arcus senilis bilaterally  Neck: supple; tracheostomy Heart: regular rate and rhythm; tachycardia Lungs: shallow rapid respirations Abdomen: soft; nondistended; nontender; no masses or hepatosplenomegaly; bowel sounds present; gastrostomy tube LUQ; Extremities: No deformity; full range of motion; pulses weak; Neurologic: Awake, alert and oriented; motor function intact in all extremities and symmetric; no facial droop Skin: Warm and dry Psychiatric: Normal mood and affect   RESULTS  Summary of this visit's results, reviewed by myself:   EKG Interpretation  Date/Time:    Ventricular Rate:    PR Interval:    QRS Duration:   QT Interval:    QTC Calculation:   R Axis:     Text  Interpretation:        Laboratory Studies: Results for orders placed or performed during the hospital encounter of 07/04/16 (from the past 24 hour(s))  Comprehensive metabolic panel     Status: Abnormal   Collection Time: 07/04/16 11:16 PM  Result Value Ref Range   Sodium 133 (L) 135 - 145 mmol/L   Potassium 4.5 3.5 - 5.1 mmol/L   Chloride 98 (L) 101 - 111 mmol/L   CO2 20 (L) 22 - 32 mmol/L   Glucose, Bld 98 65 - 99 mg/dL   BUN 23 (H) 6 - 20 mg/dL   Creatinine, Ser 1.88 (H) 0.61 - 1.24 mg/dL   Calcium 9.0 8.9 - 10.3 mg/dL   Total Protein 9.7 (H) 6.5 - 8.1 g/dL   Albumin 4.1 3.5 - 5.0 g/dL   AST 48 (H) 15 - 41 U/L   ALT 16 (L) 17 - 63 U/L   Alkaline Phosphatase 93 38 - 126 U/L   Total Bilirubin 3.1 (H) 0.3 - 1.2 mg/dL   GFR calc non Af  Amer 36 (L) >60 mL/min   GFR calc Af Amer 42 (L) >60 mL/min   Anion gap 15 5 - 15  CBC with Differential     Status: Abnormal   Collection Time: 07/04/16 11:16 PM  Result Value Ref Range   WBC 7.8 4.0 - 10.5 K/uL   RBC 4.74 4.22 - 5.81 MIL/uL   Hemoglobin 14.9 13.0 - 17.0 g/dL   HCT 44.6 39.0 - 52.0 %   MCV 94.1 78.0 - 100.0 fL   MCH 31.4 26.0 - 34.0 pg   MCHC 33.4 30.0 - 36.0 g/dL   RDW 17.1 (H) 11.5 - 15.5 %   Platelets 168 150 - 400 K/uL   Neutrophils Relative % 75 %   Neutro Abs 5.8 1.7 - 7.7 K/uL   Lymphocytes Relative 17 %   Lymphs Abs 1.3 0.7 - 4.0 K/uL   Monocytes Relative 8 %   Monocytes Absolute 0.6 0.1 - 1.0 K/uL   Eosinophils Relative 0 %   Eosinophils Absolute 0.0 0.0 - 0.7 K/uL   Basophils Relative 0 %   Basophils Absolute 0.0 0.0 - 0.1 K/uL  I-Stat CG4 Lactic Acid, ED     Status: Abnormal   Collection Time: 07/04/16 11:27 PM  Result Value Ref Range   Lactic Acid, Venous 4.13 (HH) 0.5 - 1.9 mmol/L   Comment NOTIFIED PHYSICIAN   Protime-INR     Status: None   Collection Time: 07/04/16 11:35 PM  Result Value Ref Range   Prothrombin Time 15.1 11.4 - 15.2 seconds   INR 1.18    Imaging Studies: Dg Chest 2  View  Result Date: 07/04/2016 CLINICAL DATA:  Fever EXAM: CHEST  2 VIEW COMPARISON:  05/27/2014 FINDINGS: The heart size and mediastinal contours are within normal limits. Mild aortic atherosclerosis without aneurysm. Both lungs are clear. Minimal atelectasis is seen at the left lung base. No acute nor suspicious osseous abnormality. IMPRESSION: No active cardiopulmonary disease.  Aortic atherosclerosis. Electronically Signed   By: Ashley Royalty M.D.   On: 07/04/2016 23:48    ED COURSE  Nursing notes and initial vitals signs, including pulse oximetry, reviewed.  Vitals:   07/04/16 2229 07/05/16 0119 07/05/16 0153  BP: (!) 78/55  111/71  Pulse: (!) 37  89  Resp: (!) 32  (!) 31  Temp: (!) 102.9 F (39.4 C) 99.3 F (37.4 C)   TempSrc: Oral Oral   SpO2: 100%  96%  Weight: 140 lb (63.5 kg)    Height: 5\' 10"  (1.778 m)     11:39 PM Clindamycin ordered for suspected facial cellulitis.  2:09 AM Dr. Alcario Drought at bedside.  PROCEDURES    ED DIAGNOSES     ICD-9-CM ICD-10-CM   1. Sepsis affecting skin ([place]) 682.9 A41.9   2. Erysipelas 035 A46     I personally performed the services described in this documentation, which was scribed in my presence. The recorded information has been reviewed and is accurate.     Shanon Rosser, MD 07/05/16 [phone]

## 2016-07-04 NOTE — ED Notes (Signed)
Bed: WA11 Expected date:  Expected time:  Means of arrival:  Comments: Room 7 

## 2016-07-04 NOTE — ED Notes (Signed)
Patient transported to X-ray 

## 2016-07-04 NOTE — ED Triage Notes (Signed)
Per patient's daughter, patient had what appeared to be inset bites to right side of patient's face since this morning. Daughter reports increased swelling to face and patient reports numbness to tongue and lips. Patient has trach. Hx mouth and throat cancer.

## 2016-07-04 NOTE — ED Notes (Signed)
Bed: WA07 Expected date:  Expected time:  Means of arrival:  Comments: 

## 2016-07-05 ENCOUNTER — Encounter (HOSPITAL_COMMUNITY): Payer: Self-pay

## 2016-07-05 DIAGNOSIS — Z79899 Other long term (current) drug therapy: Secondary | ICD-10-CM | POA: Diagnosis not present

## 2016-07-05 DIAGNOSIS — Z79891 Long term (current) use of opiate analgesic: Secondary | ICD-10-CM | POA: Diagnosis not present

## 2016-07-05 DIAGNOSIS — A46 Erysipelas: Secondary | ICD-10-CM | POA: Diagnosis not present

## 2016-07-05 DIAGNOSIS — N179 Acute kidney failure, unspecified: Secondary | ICD-10-CM | POA: Diagnosis not present

## 2016-07-05 DIAGNOSIS — R001 Bradycardia, unspecified: Secondary | ICD-10-CM | POA: Diagnosis present

## 2016-07-05 DIAGNOSIS — I110 Hypertensive heart disease with heart failure: Secondary | ICD-10-CM | POA: Diagnosis present

## 2016-07-05 DIAGNOSIS — I509 Heart failure, unspecified: Secondary | ICD-10-CM | POA: Diagnosis present

## 2016-07-05 DIAGNOSIS — E785 Hyperlipidemia, unspecified: Secondary | ICD-10-CM | POA: Diagnosis present

## 2016-07-05 DIAGNOSIS — I10 Essential (primary) hypertension: Secondary | ICD-10-CM | POA: Diagnosis not present

## 2016-07-05 DIAGNOSIS — Z7982 Long term (current) use of aspirin: Secondary | ICD-10-CM | POA: Diagnosis not present

## 2016-07-05 DIAGNOSIS — Z85819 Personal history of malignant neoplasm of unspecified site of lip, oral cavity, and pharynx: Secondary | ICD-10-CM | POA: Diagnosis not present

## 2016-07-05 DIAGNOSIS — E271 Primary adrenocortical insufficiency: Secondary | ICD-10-CM | POA: Diagnosis not present

## 2016-07-05 DIAGNOSIS — Z885 Allergy status to narcotic agent status: Secondary | ICD-10-CM | POA: Diagnosis not present

## 2016-07-05 DIAGNOSIS — Z931 Gastrostomy status: Secondary | ICD-10-CM | POA: Diagnosis not present

## 2016-07-05 DIAGNOSIS — Z8249 Family history of ischemic heart disease and other diseases of the circulatory system: Secondary | ICD-10-CM | POA: Diagnosis not present

## 2016-07-05 DIAGNOSIS — E876 Hypokalemia: Secondary | ICD-10-CM | POA: Diagnosis present

## 2016-07-05 DIAGNOSIS — Z930 Tracheostomy status: Secondary | ICD-10-CM | POA: Diagnosis not present

## 2016-07-05 DIAGNOSIS — E039 Hypothyroidism, unspecified: Secondary | ICD-10-CM | POA: Diagnosis present

## 2016-07-05 DIAGNOSIS — I959 Hypotension, unspecified: Secondary | ICD-10-CM | POA: Diagnosis present

## 2016-07-05 DIAGNOSIS — A419 Sepsis, unspecified organism: Principal | ICD-10-CM

## 2016-07-05 DIAGNOSIS — F1010 Alcohol abuse, uncomplicated: Secondary | ICD-10-CM | POA: Diagnosis present

## 2016-07-05 DIAGNOSIS — C321 Malignant neoplasm of supraglottis: Secondary | ICD-10-CM | POA: Diagnosis not present

## 2016-07-05 DIAGNOSIS — K7460 Unspecified cirrhosis of liver: Secondary | ICD-10-CM | POA: Diagnosis present

## 2016-07-05 DIAGNOSIS — Z87891 Personal history of nicotine dependence: Secondary | ICD-10-CM | POA: Diagnosis not present

## 2016-07-05 DIAGNOSIS — Z888 Allergy status to other drugs, medicaments and biological substances status: Secondary | ICD-10-CM | POA: Diagnosis not present

## 2016-07-05 DIAGNOSIS — Z8521 Personal history of malignant neoplasm of larynx: Secondary | ICD-10-CM | POA: Diagnosis not present

## 2016-07-05 LAB — CREATININE, SERUM
CREATININE: 1.26 mg/dL — AB (ref 0.61–1.24)
GFR, EST NON AFRICAN AMERICAN: 59 mL/min — AB (ref >60)

## 2016-07-05 LAB — URINALYSIS, ROUTINE W REFLEX MICROSCOPIC
Bacteria, UA: NONE SEEN
Bilirubin Urine: NEGATIVE
GLUCOSE, UA: 50 mg/dL — AB
Ketones, ur: 5 mg/dL — AB
Nitrite: NEGATIVE
PROTEIN: 30 mg/dL — AB
SPECIFIC GRAVITY, URINE: 1.017 (ref 1.005–1.030)
pH: 5 (ref 5.0–8.0)

## 2016-07-05 LAB — COMPREHENSIVE METABOLIC PANEL
ALBUMIN: 4.1 g/dL (ref 3.5–5.0)
ALT: 16 U/L — ABNORMAL LOW (ref 17–63)
AST: 48 U/L — AB (ref 15–41)
Alkaline Phosphatase: 93 U/L (ref 38–126)
Anion gap: 15 (ref 5–15)
BUN: 23 mg/dL — AB (ref 6–20)
CHLORIDE: 98 mmol/L — AB (ref 101–111)
CO2: 20 mmol/L — ABNORMAL LOW (ref 22–32)
Calcium: 9 mg/dL (ref 8.9–10.3)
Creatinine, Ser: 1.88 mg/dL — ABNORMAL HIGH (ref 0.61–1.24)
GFR calc Af Amer: 42 mL/min — ABNORMAL LOW (ref >60)
GFR calc non Af Amer: 36 mL/min — ABNORMAL LOW (ref >60)
GLUCOSE: 98 mg/dL (ref 65–99)
POTASSIUM: 4.5 mmol/L (ref 3.5–5.1)
Sodium: 133 mmol/L — ABNORMAL LOW (ref 135–145)
Total Bilirubin: 3.1 mg/dL — ABNORMAL HIGH (ref 0.3–1.2)
Total Protein: 9.7 g/dL — ABNORMAL HIGH (ref 6.5–8.1)

## 2016-07-05 LAB — BASIC METABOLIC PANEL
Anion gap: 9 (ref 5–15)
BUN: 25 mg/dL — ABNORMAL HIGH (ref 6–20)
CO2: 20 mmol/L — ABNORMAL LOW (ref 22–32)
Calcium: 7.6 mg/dL — ABNORMAL LOW (ref 8.9–10.3)
Chloride: 108 mmol/L (ref 101–111)
Creatinine, Ser: 1.73 mg/dL — ABNORMAL HIGH (ref 0.61–1.24)
GFR calc Af Amer: 47 mL/min — ABNORMAL LOW (ref >60)
GFR calc non Af Amer: 40 mL/min — ABNORMAL LOW (ref >60)
Glucose, Bld: 94 mg/dL (ref 65–99)
Potassium: 3.6 mmol/L (ref 3.5–5.1)
Sodium: 137 mmol/L (ref 135–145)

## 2016-07-05 LAB — HEMOGLOBIN AND HEMATOCRIT, BLOOD
HCT: 32.9 % — ABNORMAL LOW (ref 39.0–52.0)
Hemoglobin: 10.9 g/dL — ABNORMAL LOW (ref 13.0–17.0)

## 2016-07-05 LAB — CBC
HCT: 34.1 % — ABNORMAL LOW (ref 39.0–52.0)
HEMOGLOBIN: 11.3 g/dL — AB (ref 13.0–17.0)
MCH: 30.5 pg (ref 26.0–34.0)
MCHC: 33.1 g/dL (ref 30.0–36.0)
MCV: 92.2 fL (ref 78.0–100.0)
PLATELETS: 148 10*3/uL — AB (ref 150–400)
RBC: 3.7 MIL/uL — ABNORMAL LOW (ref 4.22–5.81)
RDW: 16.7 % — ABNORMAL HIGH (ref 11.5–15.5)
WBC: 7.4 10*3/uL (ref 4.0–10.5)

## 2016-07-05 LAB — HIV ANTIBODY (ROUTINE TESTING W REFLEX): HIV SCREEN 4TH GENERATION: NONREACTIVE

## 2016-07-05 LAB — PROTIME-INR
INR: 1.18
PROTHROMBIN TIME: 15.1 s (ref 11.4–15.2)

## 2016-07-05 LAB — I-STAT CG4 LACTIC ACID, ED: LACTIC ACID, VENOUS: 0.81 mmol/L (ref 0.5–1.9)

## 2016-07-05 MED ORDER — SODIUM CHLORIDE 0.9 % IV BOLUS (SEPSIS)
1000.0000 mL | Freq: Once | INTRAVENOUS | Status: AC
  Administered 2016-07-05: 1000 mL via INTRAVENOUS

## 2016-07-05 MED ORDER — CEFAZOLIN SODIUM-DEXTROSE 2-4 GM/100ML-% IV SOLN
2.0000 g | Freq: Once | INTRAVENOUS | Status: AC
  Administered 2016-07-05: 2 g via INTRAVENOUS
  Filled 2016-07-05: qty 100

## 2016-07-05 MED ORDER — SODIUM CHLORIDE 0.9 % IV SOLN
INTRAVENOUS | Status: DC
  Administered 2016-07-05 – 2016-07-06 (×6): via INTRAVENOUS

## 2016-07-05 MED ORDER — ONDANSETRON HCL 4 MG PO TABS: 4.0000 mg | ORAL_TABLET | Freq: Three times a day (TID) | ORAL | Status: DC | PRN

## 2016-07-05 MED ORDER — HYDRALAZINE HCL 20 MG/ML IJ SOLN
5.0000 mg | Freq: Once | INTRAMUSCULAR | Status: AC
  Administered 2016-07-05: 5 mg via INTRAVENOUS
  Filled 2016-07-05: qty 1

## 2016-07-05 MED ORDER — FLUCONAZOLE 100 MG PO TABS: 200.0000 mg | ORAL_TABLET | Freq: Every day | ORAL | Status: DC | PRN

## 2016-07-05 MED ORDER — OXYCODONE HCL 5 MG PO TABS
5.0000 mg | ORAL_TABLET | ORAL | Status: DC | PRN
  Administered 2016-07-05 – 2016-07-06 (×2): 5 mg via ORAL
  Filled 2016-07-05 (×2): qty 1

## 2016-07-05 MED ORDER — LIDOCAINE VISCOUS 2 % MT SOLN
10.0000 mL | Freq: Four times a day (QID) | OROMUCOSAL | Status: DC | PRN
  Filled 2016-07-05 (×2): qty 15

## 2016-07-05 MED ORDER — ENOXAPARIN SODIUM 40 MG/0.4ML ~~LOC~~ SOLN
40.0000 mg | SUBCUTANEOUS | Status: DC
  Administered 2016-07-05 – 2016-07-07 (×3): 40 mg via SUBCUTANEOUS
  Filled 2016-07-05 (×4): qty 0.4

## 2016-07-05 MED ORDER — ORAL CARE MOUTH RINSE: 15.0000 mL | Freq: Two times a day (BID) | OROMUCOSAL | Status: DC

## 2016-07-05 MED ORDER — LIDOCAINE VISCOUS 2 % MT SOLN
10.0000 mL | Freq: Four times a day (QID) | OROMUCOSAL | Status: DC | PRN
  Filled 2016-07-05: qty 10

## 2016-07-05 MED ORDER — AMLODIPINE BESYLATE 5 MG PO TABS
5.0000 mg | ORAL_TABLET | Freq: Every day | ORAL | Status: DC
  Administered 2016-07-06 – 2016-07-07 (×2): 5 mg via ORAL
  Filled 2016-07-05 (×2): qty 1

## 2016-07-05 MED ORDER — OXYCODONE HCL 5 MG/5ML PO SOLN: 5.0000 mg | ORAL | Status: DC | PRN

## 2016-07-05 MED ORDER — LEVOTHYROXINE SODIUM 112 MCG PO TABS: ORAL_TABLET | ORAL | 3 refills | Status: DC

## 2016-07-05 MED ORDER — CHLORHEXIDINE GLUCONATE 0.12 % MT SOLN
15.0000 mL | Freq: Two times a day (BID) | OROMUCOSAL | Status: DC
  Administered 2016-07-05 – 2016-07-06 (×3): 15 mL via OROMUCOSAL
  Filled 2016-07-05 (×3): qty 15

## 2016-07-05 MED ORDER — FREE WATER: 175.0000 mL | Freq: Four times a day (QID) | Status: DC

## 2016-07-05 MED ORDER — LEVOTHYROXINE SODIUM 112 MCG PO TABS
112.0000 ug | ORAL_TABLET | Freq: Every day | ORAL | Status: DC
  Administered 2016-07-05 – 2016-07-07 (×3): 112 ug via ORAL
  Filled 2016-07-05 (×5): qty 1

## 2016-07-05 MED ORDER — CEFAZOLIN SODIUM-DEXTROSE 1-4 GM/50ML-% IV SOLN
1.0000 g | Freq: Three times a day (TID) | INTRAVENOUS | Status: DC
  Administered 2016-07-05 – 2016-07-06 (×3): 1 g via INTRAVENOUS
  Filled 2016-07-05 (×6): qty 50

## 2016-07-05 MED ORDER — MAGIC MOUTHWASH W/LIDOCAINE
5.0000 mL | Freq: Four times a day (QID) | ORAL | Status: DC
  Administered 2016-07-05 – 2016-07-07 (×10): 5 mL via ORAL
  Filled 2016-07-05 (×13): qty 5

## 2016-07-05 MED ORDER — ASPIRIN EC 81 MG PO TBEC
81.0000 mg | DELAYED_RELEASE_TABLET | Freq: Every day | ORAL | Status: DC
  Administered 2016-07-05 – 2016-07-07 (×3): 81 mg via ORAL
  Filled 2016-07-05 (×3): qty 1

## 2016-07-05 NOTE — H&P (Signed)
History and Physical    [name] VQQ:[id number] DOB: [birth year] DOA: 07/04/2016  PCP: Smiley Houseman, MD  Patient coming from: Home  I have personally briefly reviewed patient's old medical records in [place]  Chief Complaint: Fever  HPI: [name] is a 66 y.o. male with medical history significant of HTN, cirrhosis, laryngeal CA.  Patient presents to the ED with c/o R sided facial swelling and erythema that onset this morning.  Daughter gave patient several doses of benadryl without relief.  Due to worsening symptoms of pain on that side of face as well as fever and not feeling well he presented to the ED.   ED Course: Patient initially septic on arrival with Tm 102.9, SBP 78, tachycardia.  He has now improved MAP into the 80s s/p 2L IVF bolus, tachycardia and fever have resolved.  He was given a dose of clindamycin.   Review of Systems: As per HPI otherwise 10 point review of systems negative.   Past Medical History:  Diagnosis Date  . Alcohol abuse   . Arthritis   . CHF (congestive heart failure) ([place])    EF 45-50% 05/2012  . Cirrhosis of liver ([place])   . Hyperlipidemia   . Hypertension   . Hypothyroidism   . Laryngeal cancer ([place]) 2011   Laryngectomy, T3N0M0  . Seizures ([place])     Past Surgical History:  Procedure Laterality Date  . ESOPHAGOGASTRODUODENOSCOPY N/A 04/10/2014   Procedure: ESOPHAGOGASTRODUODENOSCOPY (EGD);  Surgeon: Missy Sabins, MD;  Location[facility] ENDOSCOPY;  Service: Endoscopy;  Laterality: N/A;  . LEFT AND RIGHT HEART CATHETERIZATION WITH CORONARY ANGIOGRAM N/A 12/28/2011   Procedure: LEFT AND RIGHT HEART CATHETERIZATION WITH CORONARY ANGIOGRAM;  Surgeon: Jolaine Artist, MD;  Location: [facility] CATH LAB;  Service: Cardiovascular;  Laterality: N/A;  . TRACHEAL SURGERY    . TRACHEOSTOMY       reports that he has quit smoking. His smoking use included Cigarettes. He has a 10.00 pack-year smoking history. He has never used smokeless  tobacco. He reports that he drinks alcohol. He reports that he does not use drugs.  Allergies  Allergen Reactions  . Vicodin [Hydrocodone-Acetaminophen] Rash  . Other     Unknown anesthesia medicine caused cardiac arrest.  . Tylenol [Acetaminophen]     HBP -Liver Cirrhosis    Family History  Problem Relation Age of Onset  . Hyperlipidemia Mother   . Hypertension Mother   . Hypertension Father   . Hyperlipidemia Father      Prior to Admission medications   Medication Sig Start Date End Date Taking? Authorizing Provider  Alum & Mag Hydroxide-Simeth (MAGIC MOUTHWASH W/LIDOCAINE) SOLN Take 5 mLs by mouth 4 (four) times daily. 05/07/14  Yes Frazier Richards, MD  amLODipine (NORVASC) 5 MG tablet Take 1 tablet (5 mg total) by mouth daily. 12/27/15  Yes Elayne Snare, MD  aspirin EC 81 MG tablet Take 81 mg by mouth daily. 03/12/14  Yes Historical Provider, MD  bisoprolol (ZEBETA) 10 MG tablet TAKE 1 TABLET(10 MG) BY MOUTH DAILY 06/03/16  Yes Elayne Snare, MD  fluconazole (DIFLUCAN) 200 MG tablet Take 200 mg by mouth daily as needed (thrush).  07/18/14  Yes Historical Provider, MD  guaiFENesin (MUCINEX) 600 MG 12 hr tablet Take 1 tablet (600 mg total) by mouth 2 (two) times daily. 05/26/14  Yes Renee A Kuneff, DO  lidocaine (XYLOCAINE) 2 % solution Use as directed 10 mLs in the mouth or throat 4 (four) times  daily as needed for mouth pain.  07/09/14  Yes Historical Provider, MD  Nutritional Supplements (FEEDING SUPPLEMENT, JEVITY 1.5 CAL,) LIQD Place 70 mL/hr into feeding tube continuous.   Yes Historical Provider, MD  ondansetron (ZOFRAN) 4 MG tablet Take 1 tablet (4 mg total) by mouth every 8 (eight) hours as needed for nausea or vomiting. 05/14/13  Yes Leone Brand, MD  oxyCODONE (ROXICODONE) 5 MG/5ML solution Take 5 mLs (5 mg total) by mouth every 4 (four) hours as needed for severe pain. 05/07/14  Yes Frazier Richards, MD  Potassium Chloride ER 20 MEQ TBCR TAKE 1 TABLET BY MOUTH DAILY 10/07/15  Yes Elayne Snare,  MD  levothyroxine (SYNTHROID, LEVOTHROID) 112 MCG tablet TAKE 1 TABLET(112 MCG) BY MOUTH DAILY BEFORE BREAKFAST 07/05/16   Shanon Rosser, MD  Water For Irrigation, Sterile (FREE WATER) SOLN Place 175 mLs into feeding tube 4 (four) times daily. 05/07/14   Frazier Richards, MD    Physical Exam: Vitals:   07/04/16 2229 07/05/16 0119 07/05/16 0153  BP: (!) 78/55  111/71  Pulse: (!) 37  89  Resp: (!) 32  (!) 31  Temp: (!) 102.9 F (39.4 C) 99.3 F (37.4 C)   TempSrc: Oral Oral   SpO2: 100%  96%  Weight: 63.5 kg (140 lb)    Height: 5\' 10"  (1.778 m)      Constitutional: NAD, calm, comfortable Eyes: PERRL, lids and conjunctivae normal ENMT: Mucous membranes are moist. Posterior pharynx clear of any exudate or lesions.Normal dentition.  Neck: normal, supple, no masses, no thyromegaly Respiratory: clear to auscultation bilaterally, no wheezing, no crackles. Normal respiratory effort. No accessory muscle use.  Cardiovascular: Regular rate and rhythm, no murmurs / rubs / gallops. No extremity edema. 2+ pedal pulses. No carotid bruits.  Abdomen: no tenderness, no masses palpated. No hepatosplenomegaly. Bowel sounds positive. No rash or erythema around PEG site.  Musculoskeletal: no clubbing / cyanosis. No joint deformity upper and lower extremities. Good ROM, no contractures. Normal muscle tone.  Skin: Has Erysipelas of R face Neurologic: CN 2-12 grossly intact. Sensation intact, DTR normal. Strength 5/5 in all 4.  Psychiatric: Normal judgment and insight. Alert and oriented x 3. Normal mood.    Labs on Admission: I have personally reviewed following labs and imaging studies  CBC:  Recent Labs Lab 07/04/16 2316  WBC 7.8  NEUTROABS 5.8  HGB 14.9  HCT 44.6  MCV 94.1  PLT 573   Basic Metabolic Panel:  Recent Labs Lab 07/04/16 2316  NA 133*  K 4.5  CL 98*  CO2 20*  GLUCOSE 98  BUN 23*  CREATININE 1.88*  CALCIUM 9.0   GFR: Estimated Creatinine Clearance: 36.1 mL/min (A) (by C-G  formula based on SCr of 1.88 mg/dL (H)). Liver Function Tests:  Recent Labs Lab 07/04/16 2316  AST 48*  ALT 16*  ALKPHOS 93  BILITOT 3.1*  PROT 9.7*  ALBUMIN 4.1   No results for input(s): LIPASE, AMYLASE in the last 168 hours. No results for input(s): AMMONIA in the last 168 hours. Coagulation Profile:  Recent Labs Lab 07/04/16 2335  INR 1.18   Cardiac Enzymes: No results for input(s): CKTOTAL, CKMB, CKMBINDEX, TROPONINI in the last 168 hours. BNP (last 3 results) No results for input(s): PROBNP in the last 8760 hours. HbA1C: No results for input(s): HGBA1C in the last 72 hours. CBG: No results for input(s): GLUCAP in the last 168 hours. Lipid Profile: No results for input(s): CHOL, HDL, LDLCALC, TRIG, CHOLHDL,  LDLDIRECT in the last 72 hours. Thyroid Function Tests: No results for input(s): TSH, T4TOTAL, FREET4, T3FREE, THYROIDAB in the last 72 hours. Anemia Panel: No results for input(s): VITAMINB12, FOLATE, FERRITIN, TIBC, IRON, RETICCTPCT in the last 72 hours. Urine analysis:    Component Value Date/Time   COLORURINE YELLOW 07/30/2014 1502   APPEARANCEUR CLEAR 07/30/2014 1502   LABSPEC 1.014 07/30/2014 1502   PHURINE 8.0 07/30/2014 1502   GLUCOSEU NEGATIVE 07/30/2014 1502   HGBUR MODERATE (A) 07/30/2014 1502   BILIRUBINUR NEGATIVE 07/30/2014 1502   KETONESUR NEGATIVE 07/30/2014 1502   PROTEINUR NEGATIVE 07/30/2014 1502   UROBILINOGEN 0.2 07/30/2014 1502   NITRITE NEGATIVE 07/30/2014 1502   LEUKOCYTESUR NEGATIVE 07/30/2014 1502    Radiological Exams on Admission: Dg Chest 2 View  Result Date: 07/04/2016 CLINICAL DATA:  Fever EXAM: CHEST  2 VIEW COMPARISON:  05/27/2014 FINDINGS: The heart size and mediastinal contours are within normal limits. Mild aortic atherosclerosis without aneurysm. Both lungs are clear. Minimal atelectasis is seen at the left lung base. No acute nor suspicious osseous abnormality. IMPRESSION: No active cardiopulmonary disease.  Aortic  atherosclerosis. Electronically Signed   By: Ashley Royalty M.D.   On: 07/04/2016 23:48    EKG: Independently reviewed.  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 Sepsis due to Erysipelas of face - 1. Got clinda in ED, will change this to ancef (would broaden to something like zosyn and vanc instead except he has already had rapid improvement in vitals, symptoms, etc). 2. IVF 3. Initial hypotension has now resolved and lactate has dropped from 4.1 to 0.81. 2. AKI -  1. IVF 2. Repeat BMP in AM 3. HTN - hold BP meds due to initial hypotension on presentation 4. h/o suspected adrenal insufficiency - 1. If patient vital signs worsen then would put patient on stress dose steroids given history.  But will hold off for now with stabilization and improvement of vitals. 5. h/o throat cancer - 1. In remission s/p treatment 2. Has PEG tube but this isnt being used much recently due to him taking food PO  DVT prophylaxis: Lovenox Code Status: Full Family Communication: Family at bedside Disposition Plan: Home after admission Consults called: None Admission status: Admit to inpatient - initial sepsis and hypotension on presentation, likely needs multiple days of ABx for erysipelas   Etta Quill DO [facility] Pager [phone]  If 7AM-7PM, please contact day team taking care of patient [email or web address] Password [email or web address]  07/05/2016, 2:53 AM

## 2016-07-05 NOTE — Progress Notes (Signed)
Pharmacy Antibiotic Note  [name] is a 66 y.o. male with fever and right sided facial swelling and erythema admitted on 07/04/2016 with cellulitis.  Pharmacy has been consulted for Ancef dosing.  Plan: Ancef 2 Gm x1 then 1 Gm IV q8h F/u scr/cultures  Height: 5\' 10"  (177.8 cm) Weight: 140 lb (63.5 kg) IBW/kg (Calculated) : 73  Temp (24hrs), Avg:100.3 F (37.9 C), Min:98.7 F (37.1 C), Max:102.9 F (39.4 C)   Recent Labs Lab 07/04/16 2316 07/04/16 2327 07/05/16 0245  WBC 7.8  --   --   CREATININE 1.88*  --   --   LATICACIDVEN  --  4.13* 0.81    Estimated Creatinine Clearance: 36.1 mL/min (A) (by C-G formula based on SCr of 1.88 mg/dL (H)).    Allergies  Allergen Reactions  . Vicodin [Hydrocodone-Acetaminophen] Rash  . Other     Unknown anesthesia medicine caused cardiac arrest.  . Tylenol [Acetaminophen]     HBP -Liver Cirrhosis    Antimicrobials this admission: 5/2 clindamycin >> x1 ED 5/2 ancef >>   Dose adjustments this admission:   Microbiology results:  BCx:   UCx:    Sputum:    MRSA PCR:   Thank you for allowing pharmacy to be a part of this patient's care.  Dorrene German 07/05/2016 3:12 AM

## 2016-07-05 NOTE — Progress Notes (Signed)
Notified on call md of BP 171/101 x2  and 160/103 manually, new order given.

## 2016-07-05 NOTE — Progress Notes (Signed)
Pt requested not be awakened during this time. Checked in on him.Marland Kitchen He is in no distress. [facility]. Notified RN to call me if she needs me for anything.

## 2016-07-05 NOTE — Progress Notes (Signed)
RT made aware by admitting RN that pt. had arrived from ED that has Tracheostomy Prostetic in place and to please bring required "head of bed" equipment sheet to make all available, continuous Pulse Oximeter obtained, and in room with other equipment available placed in room, wife with daughter at bedside and spoke with about pts. Tracheostomy prostetic and what equipment that would be needed and offered to bring in from home, pt. Is currently being seen/followed by [facility], oncoming staff to be made aware, RT to monitor.

## 2016-07-05 NOTE — Progress Notes (Signed)
Patient transferred to [facility] transported patient. Patient belongings with [facility]. Patient is not in distress.

## 2016-07-05 NOTE — Progress Notes (Signed)
Patient seen and examined at bedside, patient admitted after midnight, please see earlier detailed admission note by Etta Quill, DO. Briefly, patient presented with sepsis physiology secondary to skin infection. Initially hypotensive but responded to fluids. Started on clindamycin. Blood cultures pending. Discussed with Family Medicine Service who will assume care of patient going forward. Discussed with patient and patient's daughter who is agreeable to transfer.   Cordelia Poche, MD [facility] 07/05/2016, 8:19 AM Pager: [phone]

## 2016-07-06 DIAGNOSIS — I10 Essential (primary) hypertension: Secondary | ICD-10-CM

## 2016-07-06 DIAGNOSIS — A46 Erysipelas: Secondary | ICD-10-CM

## 2016-07-06 DIAGNOSIS — N179 Acute kidney failure, unspecified: Secondary | ICD-10-CM

## 2016-07-06 LAB — CBC
HCT: 35.5 % — ABNORMAL LOW (ref 39.0–52.0)
Hemoglobin: 11.9 g/dL — ABNORMAL LOW (ref 13.0–17.0)
MCH: 30.1 pg (ref 26.0–34.0)
MCHC: 33.5 g/dL (ref 30.0–36.0)
MCV: 89.9 fL (ref 78.0–100.0)
PLATELETS: 151 10*3/uL (ref 150–400)
RBC: 3.95 MIL/uL — AB (ref 4.22–5.81)
RDW: 16.4 % — ABNORMAL HIGH (ref 11.5–15.5)
WBC: 4.4 10*3/uL (ref 4.0–10.5)

## 2016-07-06 LAB — BASIC METABOLIC PANEL
ANION GAP: 10 (ref 5–15)
BUN: 13 mg/dL (ref 6–20)
CHLORIDE: 103 mmol/L (ref 101–111)
CO2: 22 mmol/L (ref 22–32)
Calcium: 8.1 mg/dL — ABNORMAL LOW (ref 8.9–10.3)
Creatinine, Ser: 1.07 mg/dL (ref 0.61–1.24)
GFR calc Af Amer: >60 mL/min (ref >60)
GLUCOSE: 98 mg/dL (ref 65–99)
POTASSIUM: 3.5 mmol/L (ref 3.5–5.1)
Sodium: 135 mmol/L (ref 135–145)

## 2016-07-06 MED ORDER — CEPHALEXIN 500 MG PO CAPS
500.0000 mg | ORAL_CAPSULE | Freq: Four times a day (QID) | ORAL | Status: DC
  Administered 2016-07-06 – 2016-07-07 (×6): 500 mg via ORAL
  Filled 2016-07-06 (×6): qty 1

## 2016-07-06 MED ORDER — BISOPROLOL FUMARATE 5 MG PO TABS
10.0000 mg | ORAL_TABLET | Freq: Every day | ORAL | Status: DC
  Administered 2016-07-07: 10 mg via ORAL
  Filled 2016-07-06: qty 2

## 2016-07-06 MED ORDER — BISOPROLOL FUMARATE 5 MG PO TABS
5.0000 mg | ORAL_TABLET | Freq: Every day | ORAL | Status: DC
  Administered 2016-07-06 (×2): 5 mg via ORAL
  Filled 2016-07-06 (×2): qty 1

## 2016-07-06 NOTE — Progress Notes (Signed)
FMTS ATTENDING NOTE Kehinde Eniola,MD I  have seen and examined this patient, reviewed their chart. I have discussed this patient with the resident. I agree with the resident's findings, assessment and care plan below.  In addition, patient is doing well today. He denies any concern, he is afebrile and denies any pain. His right sided facial swelling has gone down tremendously. He feels at his baseline.  No current facility-administered medications on file prior to encounter.    Current Outpatient Prescriptions on File Prior to Encounter  Medication Sig Dispense Refill  . Alum & Mag Hydroxide-Simeth (MAGIC MOUTHWASH W/LIDOCAINE) SOLN Take 5 mLs by mouth 4 (four) times daily. 120 mL 0  . amLODipine (NORVASC) 5 MG tablet Take 1 tablet (5 mg total) by mouth daily. 30 tablet 3  . aspirin EC 81 MG tablet Take 81 mg by mouth daily.    . bisoprolol (ZEBETA) 10 MG tablet TAKE 1 TABLET(10 MG) BY MOUTH DAILY 30 tablet 3  . fluconazole (DIFLUCAN) 200 MG tablet Take 200 mg by mouth daily as needed (thrush).     Marland Kitchen guaiFENesin (MUCINEX) 600 MG 12 hr tablet Take 1 tablet (600 mg total) by mouth 2 (two) times daily. 120 tablet 3  . lidocaine (XYLOCAINE) 2 % solution Use as directed 10 mLs in the mouth or throat 4 (four) times daily as needed for mouth pain.   1  . Nutritional Supplements (FEEDING SUPPLEMENT, JEVITY 1.5 CAL,) LIQD Place 70 mL/hr into feeding tube continuous.    . ondansetron (ZOFRAN) 4 MG tablet Take 1 tablet (4 mg total) by mouth every 8 (eight) hours as needed for nausea or vomiting. 60 tablet 0  . oxyCODONE (ROXICODONE) 5 MG/5ML solution Take 5 mLs (5 mg total) by mouth every 4 (four) hours as needed for severe pain. 90 mL 0  . Potassium Chloride ER 20 MEQ TBCR TAKE 1 TABLET BY MOUTH DAILY 90 tablet 3  . Water For Irrigation, Sterile (FREE WATER) SOLN Place 175 mLs into feeding tube 4 (four) times daily. 1000 mL 0   Past Medical History:  Diagnosis Date  . Alcohol abuse   . Arthritis    . CHF (congestive heart failure) ([place])    EF 45-50% 05/2012  . Cirrhosis of liver ([place])   . Hyperlipidemia   . Hypertension   . Hypothyroidism   . Laryngeal cancer ([place]) 2011   Laryngectomy, T3N0M0  . Seizures ([place])    Vitals:   07/05/16 2050 07/05/16 2253 07/06/16 0604 07/06/16 0744  BP:  (!) 160/103 (!) 155/92   Pulse: 85  79 86  Resp: 18  18 18   Temp:   98.8 F (37.1 C)   TempSrc:   Oral   SpO2: 95%  96% 92%  Weight:      Height:        Exam: Gen: Calm in bed, not in distress. HEENT: EOMI, PERLLA. Very mild right sided facial puffiness and mild erythema without tenderness, no warmth compare to other part of the face. Presence of tracheostomy on his neck. Heart: S1 S2 normal, no murmur. RRR. Resp: Air entry equal and CTA B/L. Abd: Soft, NT/ND, BS+ and normal. Ext: No edema.  A/P: 66 Y/O M with  1. Erysipelas: Responded well to IV A/B treatment,now switching to oral Keflex.    Monitor overnight his response to oral A/B.    Monitor V/S signs.  2. AKI on admission has resoled s/p IVF. He is tolerating oral hydration.    Avoid nephrotoxic  agents.  3. HTN: BP slightly elevated.     We restarted his home med.     Monitor for improvement.

## 2016-07-06 NOTE — Progress Notes (Signed)
Transitions of Care Pharmacy Note  Plan:  Educated on Keflex  Recommend monitoring renal function closely  Inpatient follow-up: renal function  --------------------------------------------- [name] is an 66 y.o. male who presents with a chief complaint cellulitis. In anticipation of discharge, pharmacy has reviewed this patient's prior to admission medication history, as well as current inpatient medications listed per the [facility].  Current medication indications, dosing, frequency, and notable side effects reviewed with patient. patient verbalized understanding of current inpatient medication regimen and is aware that the After Visit Summary when presented, will represent the most accurate medication list at discharge.   [name] expressed concerns regarding suspected allergy to cefazolin. Patient reports history of allergy to cefazolin which causes itching/rash. I informed patient he had been receiving cefazolin here inpatient and that keflex was a similar drug. He denies any rash or itching at this time. Informed provider of this finding.   Encouraged patient to monitor for and call his PCP regarding any itching/rash/GI upset.    Assessment: Understanding of regimen: good Understanding of indications: good Potential of compliance: good Barriers to Obtaining Medications: No  Patient instructed to contact inpatient pharmacy team with further questions or concerns if needed.    Time spent preparing for discharge counseling: 15 min  Time spent counseling patient: 15 min   Argie Ramming, PharmD Pharmacy Resident  Pager [phone] 07/06/16 12:15 PM

## 2016-07-06 NOTE — Progress Notes (Signed)
Family Medicine Teaching Service Daily Progress Note Intern Pager: [phone]  Patient name: [name] Medical record number: [id number] Date of birth: 03-23-1950 Age: 66 y.o. Gender: male  Primary Care Provider: Smiley Houseman, MD Consultants: none Code Status: FULL  Pt Overview and Major Events to Date:  5/2 admitted with sepsis  5/3 transferred to [facility]  Assessment and Plan: [name] is a 66 yo male with PMH including HTN, cirrhosis, laryngeal CA who presented to [facility] with rapidly emerging erysipelas which has improved on ancef.   Erysipelas: treated with ancef and rapidly improving. Transition to PO today, VSS and anticipate d/c tomorrow if continues to improve.  -keflex 500mg  QID  AKI, resolved: initially present on admission, resolved with IVF.  -daily BMP  HTN: bisoprolol is home med.  -increase bisoprolol to 10mg  daily  H/o suspected adrenal insufficiency: would consider steroids if VS abnormal.  -monitor vital signs  FEN/GI: regular diet PPx: lovenox  Disposition: continued management of infection  Subjective:  Patient feels much improved today. He is amenable to PO antibiotics and discharge likely tomorrow.   Objective: Temp:  [97.9 F (36.6 C)-99 F (37.2 C)] 98.8 F (37.1 C) (05/03 0604) Pulse Rate:  [79-86] 86 (05/03 0744) Resp:  [18-20] 18 (05/03 0744) BP: (148-171)/(92-103) 155/92 (05/03 0604) SpO2:  [92 %-98 %] 92 % (05/03 0744) Physical Exam: General: Thin male resting in bed in NAD.  Cardiovascular: RRR, no murmur Respiratory: CTAB, easy WOB Abdomen: soft, nontender nondistended, +BS.  Extremities: no edema, WWP.   Laboratory:  Recent Labs Lab 07/04/16 2316 07/05/16 0357 07/05/16 1539 07/06/16 0302  WBC 7.8 7.4  --  4.4  HGB 14.9 11.3* 10.9* 11.9*  HCT 44.6 34.1* 32.9* 35.5*  PLT 168 148*  --  151    Recent Labs Lab 07/04/16 2316 07/05/16 0357 07/05/16 1539 07/06/16 0302  NA 133* 137  --  135  K 4.5 3.6  --  3.5   CL 98* 108  --  103  CO2 20* 20*  --  22  BUN 23* 25*  --  13  CREATININE 1.88* 1.73* 1.26* 1.07  CALCIUM 9.0 7.6*  --  8.1*  PROT 9.7*  --   --   --   BILITOT 3.1*  --   --   --   ALKPHOS 93  --   --   --   ALT 16*  --   --   --   AST 48*  --   --   --   GLUCOSE 98 94  --  98     Imaging/Diagnostic Tests: No results found.  Nathaniel Hilding, MD 07/06/2016, 11:46 AM PGY-1, [place] Intern pager: [phone], text pages welcome

## 2016-07-07 ENCOUNTER — Other Ambulatory Visit: Payer: Self-pay | Admitting: Family Medicine

## 2016-07-07 LAB — BASIC METABOLIC PANEL
ANION GAP: 9 (ref 5–15)
BUN: 5 mg/dL — AB (ref 6–20)
CO2: 22 mmol/L (ref 22–32)
Calcium: 8.3 mg/dL — ABNORMAL LOW (ref 8.9–10.3)
Chloride: 104 mmol/L (ref 101–111)
Creatinine, Ser: 0.95 mg/dL (ref 0.61–1.24)
GFR calc Af Amer: >60 mL/min (ref >60)
GLUCOSE: 91 mg/dL (ref 65–99)
POTASSIUM: 2.9 mmol/L — AB (ref 3.5–5.1)
Sodium: 135 mmol/L (ref 135–145)

## 2016-07-07 LAB — BLOOD CULTURE ID PANEL (REFLEXED)
ACINETOBACTER BAUMANNII: NOT DETECTED
CANDIDA ALBICANS: NOT DETECTED
CANDIDA TROPICALIS: NOT DETECTED
Candida glabrata: NOT DETECTED
Candida krusei: NOT DETECTED
Candida parapsilosis: NOT DETECTED
ENTEROBACTERIACEAE SPECIES: NOT DETECTED
ENTEROCOCCUS SPECIES: NOT DETECTED
Enterobacter cloacae complex: NOT DETECTED
Escherichia coli: NOT DETECTED
HAEMOPHILUS INFLUENZAE: NOT DETECTED
Klebsiella oxytoca: NOT DETECTED
Klebsiella pneumoniae: NOT DETECTED
LISTERIA MONOCYTOGENES: NOT DETECTED
NEISSERIA MENINGITIDIS: NOT DETECTED
Proteus species: NOT DETECTED
Pseudomonas aeruginosa: NOT DETECTED
SERRATIA MARCESCENS: NOT DETECTED
STAPHYLOCOCCUS AUREUS BCID: NOT DETECTED
STREPTOCOCCUS AGALACTIAE: NOT DETECTED
STREPTOCOCCUS PYOGENES: NOT DETECTED
STREPTOCOCCUS SPECIES: NOT DETECTED
Staphylococcus species: NOT DETECTED
Streptococcus pneumoniae: NOT DETECTED

## 2016-07-07 LAB — CBC
HCT: 38 % — ABNORMAL LOW (ref 39.0–52.0)
Hemoglobin: 12.6 g/dL — ABNORMAL LOW (ref 13.0–17.0)
MCH: 29.8 pg (ref 26.0–34.0)
MCHC: 33.2 g/dL (ref 30.0–36.0)
MCV: 89.8 fL (ref 78.0–100.0)
Platelets: 168 10*3/uL (ref 150–400)
RBC: 4.23 MIL/uL (ref 4.22–5.81)
RDW: 16.3 % — AB (ref 11.5–15.5)
WBC: 3.4 10*3/uL — ABNORMAL LOW (ref 4.0–10.5)

## 2016-07-07 LAB — MAGNESIUM: Magnesium: 1.6 mg/dL — ABNORMAL LOW (ref 1.7–2.4)

## 2016-07-07 MED ORDER — MAGNESIUM SULFATE 2 GM/50ML IV SOLN
2.0000 g | Freq: Once | INTRAVENOUS | Status: DC
  Filled 2016-07-07: qty 50

## 2016-07-07 MED ORDER — POTASSIUM CHLORIDE CRYS ER 20 MEQ PO TBCR
40.0000 meq | EXTENDED_RELEASE_TABLET | Freq: Two times a day (BID) | ORAL | Status: DC
  Administered 2016-07-07: 40 meq via ORAL
  Filled 2016-07-07: qty 2

## 2016-07-07 MED ORDER — LEVOTHYROXINE SODIUM 112 MCG PO TABS: ORAL_TABLET | ORAL | 3 refills | Status: DC

## 2016-07-07 MED ORDER — POTASSIUM CHLORIDE CRYS ER 20 MEQ PO TBCR: 40.0000 meq | EXTENDED_RELEASE_TABLET | Freq: Two times a day (BID) | ORAL | 0 refills | Status: DC

## 2016-07-07 MED ORDER — CEPHALEXIN 500 MG PO CAPS: 500.0000 mg | ORAL_CAPSULE | Freq: Four times a day (QID) | ORAL | 0 refills | Status: AC

## 2016-07-07 MED ORDER — MAGNESIUM SULFATE 2 GM/50ML IV SOLN
2.0000 g | Freq: Once | INTRAVENOUS | Status: AC
  Administered 2016-07-07: 2 g via INTRAVENOUS
  Filled 2016-07-07: qty 50

## 2016-07-07 NOTE — Progress Notes (Signed)
Discharge instructions gone over with patient and patient's daughter. Prescription given, others faxed in to pharmacy. Home medications gone over. Follow up appointment is made. Reasons to call the doctor discussed. Diet and activity discussed. Patient and daughter verbalized understanding of instructions.

## 2016-07-07 NOTE — Progress Notes (Signed)
Family Medicine Teaching Service Daily Progress Note Intern Pager: [phone]  Patient name: [name] Medical record number: [id number] Date of birth: 1951-01-26 Age: 66 y.o. Gender: male  Primary Care Provider: Smiley Houseman, MD Consultants: none Code Status: FULL  Pt Overview and Major Events to Date:  5/2 admitted with sepsis  5/3 transferred to [facility]  Assessment and Plan: [name] is a 66 yo male with PMH including HTN, cirrhosis, laryngeal CA who presented to [facility] with rapidly emerging erysipelas which has improved on ancef.   Erysipelas: treated with ancef and rapidly improving. Tolerated PO transition well. Discharge today. -keflex 500mg  QID to total 7 days of antibiotics, end date 5/8  Hypokalemia: K 3.5 > 2.9.  -check magnesium -replete with Kdur 13meQ BID x 4 doses  AKI, resolved: initially present on admission, resolved with IVF.  -daily BMP  HTN: bisoprolol is home med.  -bisoprolol 10mg  daily  H/o suspected adrenal insufficiency: would consider steroids if VS abnormal.  -monitor vital signs  FEN/GI: regular diet PPx: lovenox  Disposition: discharge pending magnesium lab  Subjective:  Patient feels well, sitting up eating breakfast. Discussed hospital follow up appointment and confirmed that morning is a good time for his sister to give him a ride.   Objective: Temp:  [97.9 F (36.6 C)-98.6 F (37 C)] 97.9 F (36.6 C) (05/04 0524) Pulse Rate:  [66-75] 69 (05/04 0722) Resp:  [18] 18 (05/04 0722) BP: (154-165)/(90-96) 154/90 (05/04 0524) SpO2:  [96 %-99 %] 96 % (05/04 0722) Physical Exam: General: Thin male sitting up in bed in NAD.  Cardiovascular: RRR, no murmur Respiratory: CTAB, easy WOB Abdomen: soft, nontender nondistended, +BS.  Extremities: no edema, WWP.   Laboratory:  Recent Labs Lab 07/05/16 0357 07/05/16 1539 07/06/16 0302 07/07/16 0501  WBC 7.4  --  4.4 3.4*  HGB 11.3* 10.9* 11.9* 12.6*  HCT 34.1* 32.9* 35.5*  38.0*  PLT 148*  --  151 168    Recent Labs Lab 07/04/16 2316 07/05/16 0357 07/05/16 1539 07/06/16 0302 07/07/16 0637  NA 133* 137  --  135 135  K 4.5 3.6  --  3.5 2.9*  CL 98* 108  --  103 104  CO2 20* 20*  --  22 22  BUN 23* 25*  --  13 5*  CREATININE 1.88* 1.73* 1.26* 1.07 0.95  CALCIUM 9.0 7.6*  --  8.1* 8.3*  PROT 9.7*  --   --   --   --   BILITOT 3.1*  --   --   --   --   ALKPHOS 93  --   --   --   --   ALT 16*  --   --   --   --   AST 48*  --   --   --   --   GLUCOSE 98 94  --  98 91     Imaging/Diagnostic Tests: No results found.  Sela Hilding, MD 07/07/2016, 9:37 AM PGY-1, [place] Intern pager: [phone], text pages welcome

## 2016-07-07 NOTE — Progress Notes (Signed)
PHARMACY - PHYSICIAN COMMUNICATION CRITICAL VALUE ALERT - BLOOD CULTURE IDENTIFICATION (BCID)  Results for orders placed or performed during the hospital encounter of 07/04/16  Blood Culture ID Panel (Reflexed) (Collected: 07/04/2016 11:16 PM)  Result Value Ref Range   Enterococcus species NOT DETECTED NOT DETECTED   Listeria monocytogenes NOT DETECTED NOT DETECTED   Staphylococcus species NOT DETECTED NOT DETECTED   Staphylococcus aureus NOT DETECTED NOT DETECTED   Streptococcus species NOT DETECTED NOT DETECTED   Streptococcus agalactiae NOT DETECTED NOT DETECTED   Streptococcus pneumoniae NOT DETECTED NOT DETECTED   Streptococcus pyogenes NOT DETECTED NOT DETECTED   Acinetobacter baumannii NOT DETECTED NOT DETECTED   Enterobacteriaceae species NOT DETECTED NOT DETECTED   Enterobacter cloacae complex NOT DETECTED NOT DETECTED   Escherichia coli NOT DETECTED NOT DETECTED   Klebsiella oxytoca NOT DETECTED NOT DETECTED   Klebsiella pneumoniae NOT DETECTED NOT DETECTED   Proteus species NOT DETECTED NOT DETECTED   Serratia marcescens NOT DETECTED NOT DETECTED   Haemophilus influenzae NOT DETECTED NOT DETECTED   Neisseria meningitidis NOT DETECTED NOT DETECTED   Pseudomonas aeruginosa NOT DETECTED NOT DETECTED   Candida albicans NOT DETECTED NOT DETECTED   Candida glabrata NOT DETECTED NOT DETECTED   Candida krusei NOT DETECTED NOT DETECTED   Candida parapsilosis NOT DETECTED NOT DETECTED   Candida tropicalis NOT DETECTED NOT DETECTED    Name of physician (or Provider) Contacted:  Dr. Gwendlyn Deutscher  Changes to prescribed antibiotics required:  Continue Keflex at discharge    Binh Doten D. Mina Marble, PharmD, [place] Pager:  [phone] 07/07/2016, 4:08 PM

## 2016-07-07 NOTE — Discharge Summary (Signed)
[facility] Discharge Summary  Patient name: [name] Medical record number: [id number] Date of birth: 1951-02-28 Age: 66 y.o. Gender: male Date of Admission: 07/04/2016  Date of Discharge: 07/07/16  Admitting Physician: Etta Quill, DO  Primary Care Provider: Smiley Houseman, MD Consultants: none  Indication for Hospitalization: sepsis 2/2 erisypelas  Discharge Diagnoses/Problem List:  Erisypelas, treated AKI, resolved HTN H/o suspected adrenal insufficiency Hypokalemia  Disposition: home  Discharge Condition: stable  Discharge Exam: see progress note from day of discharge  Brief Hospital Course:  Patient presented to [facility] with sudden onset of facial swelling and erythema suggestive of erysipelas. Patient was found to be febrile, tachycardic, and hypotensive suggesting sepsis. He was given clindamycin and Ancef. Erysipelas rapidly responded to IV antibiotic treatment, significantly improving by the next day. Given the patient is a family medicine patient, he was transferred from [place] to [facility]. Infection continued to improve, and patient was de-escalated to Keflex 24 hours prior to discharge without any complications nor fever, or leukocytosis. Patient was found to be hypokalemic on day of discharge, magnesium was low and repleted. Patient discharged with 1 day of additional potassium repletion. One of 2 blood cultures found to have gram-positive rods, although no organisms identified on BCID. Given patient's clinically improved status, patient was discharge with close outpatient follow-up.  Issues for Follow Up:  1. Patient was hypokalemic on day of discharge. Magnesium found to be low and 2 g of repletion given. Sent home with 3 more doses of Kdur 2meQ. Please recheck BMP.  2. Please discuss synthroid with patient. Apparently he was not taking his newly-prescribed synthroid prior to admission and TSH on 06/20/16 was 18.  Prescribed this medicine at discharge. 3. Please recheck blood cultures. One of 2 blood cultures with gram-positive rods, all the patient was clinically well-appearing and no organisms identified on BCID.  Significant Procedures: None  Significant Labs and Imaging:   Recent Labs Lab 07/05/16 0357 07/05/16 1539 07/06/16 0302 07/07/16 0501  WBC 7.4  --  4.4 3.4*  HGB 11.3* 10.9* 11.9* 12.6*  HCT 34.1* 32.9* 35.5* 38.0*  PLT 148*  --  151 168    Recent Labs Lab 07/04/16 2316 07/05/16 0357 07/05/16 1539 07/06/16 0302 07/07/16 0637  NA 133* 137  --  135 135  K 4.5 3.6  --  3.5 2.9*  CL 98* 108  --  103 104  CO2 20* 20*  --  22 22  GLUCOSE 98 94  --  98 91  BUN 23* 25*  --  13 5*  CREATININE 1.88* 1.73* 1.26* 1.07 0.95  CALCIUM 9.0 7.6*  --  8.1* 8.3*  MG  --   --   --   --  1.6*  ALKPHOS 93  --   --   --   --   AST 48*  --   --   --   --   ALT 16*  --   --   --   --   ALBUMIN 4.1  --   --   --   --     Magnesium 1.6  Results/Tests Pending at Time of Discharge: blood cultures  Discharge Medications:  Allergies as of 07/07/2016      Reactions   Tylenol [acetaminophen] Other (See Comments), Hypertension   HBP -Liver Cirrhosis   Vicodin [hydrocodone-acetaminophen] Rash   Other    Unknown anesthesia medicine caused cardiac arrest.      Medication  List    STOP taking these medications   fentaNYL 25 MCG/HR patch Commonly known as:  DURAGESIC - dosed mcg/hr   fluconazole 200 MG tablet Commonly known as:  DIFLUCAN   free water Soln   hydrocortisone 20 MG tablet Commonly known as:  CORTEF   lidocaine 2 % solution Commonly known as:  XYLOCAINE   ondansetron 4 MG tablet Commonly known as:  ZOFRAN   Potassium Chloride ER 20 MEQ Tbcr     TAKE these medications   amLODipine 5 MG tablet Commonly known as:  NORVASC Take 1 tablet (5 mg total) by mouth daily.   aspirin EC 81 MG tablet Take 81 mg by mouth daily.   bisoprolol 10 MG tablet Commonly known as:   ZEBETA TAKE 1 TABLET(10 MG) BY MOUTH DAILY   cephALEXin 500 MG capsule Commonly known as:  KEFLEX Take 1 capsule (500 mg total) by mouth every 6 (six) hours.   feeding supplement (JEVITY 1.5 CAL) Liqd Place 70 mL/hr into feeding tube continuous.   guaiFENesin 600 MG 12 hr tablet Commonly known as:  MUCINEX Take 1 tablet (600 mg total) by mouth 2 (two) times daily.   levothyroxine 112 MCG tablet Commonly known as:  SYNTHROID, LEVOTHROID TAKE 1 TABLET(112 MCG) BY MOUTH DAILY BEFORE BREAKFAST What changed:  Another medication with the same name was removed. Continue taking this medication, and follow the directions you see here.   magic mouthwash w/lidocaine Soln Take 5 mLs by mouth 4 (four) times daily.   oxyCODONE 5 MG/5ML solution Commonly known as:  ROXICODONE Take 5 mLs (5 mg total) by mouth every 4 (four) hours as needed for severe pain.   potassium chloride SA 20 MEQ tablet Commonly known as:  K-DUR,KLOR-CON Take 2 tablets (40 mEq total) by mouth 2 (two) times daily.       Discharge Instructions: Please refer to Patient Instructions section of EMR for full details.  Patient was counseled important signs and symptoms that should prompt return to medical care, changes in medications, dietary instructions, activity restrictions, and follow up appointments.   Follow-Up Appointments: Follow-up Information    Marina Goodell, MD. Go on 07/10/2016.   Specialty:  Family Medicine Why:  Go to your hospital follow up appointment with Dr. Lincoln Brigham to make sure your face infection is getting better.  Contact information: [address] [place]  [address] [phone]           Sela Hilding, MD 07/07/2016, 2:25 PM PGY-1, [place]

## 2016-07-09 ENCOUNTER — Telehealth: Payer: Self-pay | Admitting: Family Medicine

## 2016-07-09 LAB — CULTURE, BLOOD (ROUTINE X 2): SPECIAL REQUESTS: ADEQUATE

## 2016-07-09 NOTE — Telephone Encounter (Signed)
I got blood culture report after the initial report was neg prior to patient's d/ced home.  I called microbiology to discuss report.  BOTTLES DRAWN AEROBIC AND ANAEROBIC Blood Culture adequate volume    Culture Setup Time GRAM POSITIVE RODS  AEROBIC BOTTLE ONLY      I spoke with Magda Paganini the [employer]. She stated they have been trying to ID the specie but unable to do so. She will check if it is Corynebacterium ( Contaminant) and get back to me soon.  Patient was sent home on Keflex. I will contact him to check on his after final report is communicated to me by Magda Paganini. I gave her my pager number to call me back.

## 2016-07-10 ENCOUNTER — Ambulatory Visit (INDEPENDENT_AMBULATORY_CARE_PROVIDER_SITE_OTHER): Payer: Medicare Other | Admitting: Student

## 2016-07-10 ENCOUNTER — Other Ambulatory Visit: Payer: Self-pay | Admitting: Endocrinology

## 2016-07-10 ENCOUNTER — Encounter: Payer: Self-pay | Admitting: Student

## 2016-07-10 VITALS — BP 126/80 | HR 80 | Temp 98.3°F | Ht 70.0 in | Wt 122.0 lb

## 2016-07-10 DIAGNOSIS — M25551 Pain in right hip: Secondary | ICD-10-CM | POA: Diagnosis not present

## 2016-07-10 DIAGNOSIS — E876 Hypokalemia: Secondary | ICD-10-CM | POA: Diagnosis not present

## 2016-07-10 DIAGNOSIS — L03211 Cellulitis of face: Secondary | ICD-10-CM | POA: Diagnosis not present

## 2016-07-10 DIAGNOSIS — L0390 Cellulitis, unspecified: Secondary | ICD-10-CM | POA: Insufficient documentation

## 2016-07-10 LAB — CULTURE, BLOOD (ROUTINE X 2): Culture: NO GROWTH

## 2016-07-10 NOTE — Consult Note (Signed)
           [facility] CM Primary Care Navigator  07/10/2016  [name] Nov 13, 1950 [id number]   Went to see patient in the room to identify possible discharge needs but he was alreadydischarged.  Patient was discharged home per staff.  Attempted to call primary care provider's office to notify of patient's discharge and need for post hospital follow-up and transition of care but noted that patient had been to PCP officetoday for hospital follow-up visit.  For questions, please contact:  Dannielle Huh, BSN, RN- [facility] Primary Care Navigator  Telephone: [phone] [place]

## 2016-07-10 NOTE — Assessment & Plan Note (Signed)
Chronic right hip pain, no red flags on exam.  - Hip xray ordered - will follow and treat as needed

## 2016-07-10 NOTE — Assessment & Plan Note (Signed)
Improving with antibiotics post hospitalization

## 2016-07-10 NOTE — Progress Notes (Signed)
   Subjective:    [name], male    DOB: [birth year], 66 y.o.   MRN: [id number]   CC: hospital follow up, right hip pain  HPI: 66 y/o M presents for hospital follow up of right facial cellulitis, right hip pain  Right facial cellulitis - has been improving since discharge - no fevers - has been taking his antibiotics  Hypokalemia - noted in the hospital - was discharged with 28mEq Kdur x3  Right hip pain - has been present for the last 4 months - no weakness, no known preceding injuries but he does states that he used to fall a lot and possibly feel on his hip - no back pain  Smoking status reviewed  Review of Systems  Per HPI, else denies  chest pain, shortness of breath,     Objective:  BP 126/80   Pulse 80   Temp 98.3 F (36.8 C) (Oral)   Ht 5\' 10"  (1.778 m)   Wt 122 lb (55.3 kg)   SpO2 94%   BMI 17.51 kg/m  Vitals and nursing note reviewed  General: NAD HEENT: tracheostomy in place Cardiac: RRR, Respiratory: CTAB, normal effort Extremities: no edema or cyanosis. WWP. MSK: 5/5 left LE strength, 4/5 right LE strength, pain with faber and fadir on the right, no pain on the left bilateral reduced hip ROM Skin: mild erythema to the right nasolabial fold, no tenderness to palpation or warmth Neuro: alert and oriented, no focal deficits   Assessment & Plan:    Cellulitis Improving with antibiotics post hospitalization  Hypokalemia Hypokalemia noted in the hospital, Now on day 3 of kdur 37mEq - BMP today  Right hip pain Chronic right hip pain, no red flags on exam.  - Hip xray ordered - will follow and treat as needed    Emmalynn Pinkham A. Lincoln Brigham MD, [place] [facility] Resident PGY-3 Pager [phone]

## 2016-07-10 NOTE — Assessment & Plan Note (Signed)
Hypokalemia noted in the hospital, Now on day 3 of kdur 26mEq - BMP today

## 2016-07-10 NOTE — Patient Instructions (Signed)
Follow up in 1-2 weeks with Dr Dallas Schimke for hip pain You can take Biofeeeze or motrin for pain Please finish you antibiotics Call the office with questions or cocnerns

## 2016-07-11 LAB — BASIC METABOLIC PANEL
BUN/Creatinine Ratio: 8 — ABNORMAL LOW (ref 10–24)
BUN: 10 mg/dL (ref 8–27)
CALCIUM: 9.2 mg/dL (ref 8.6–10.2)
CO2: 21 mmol/L (ref 18–29)
CREATININE: 1.24 mg/dL (ref 0.76–1.27)
Chloride: 100 mmol/L (ref 96–106)
GFR calc Af Amer: 71 mL/min/{1.73_m2} (ref >59)
GFR calc non Af Amer: 61 mL/min/{1.73_m2} (ref >59)
GLUCOSE: 81 mg/dL (ref 65–99)
POTASSIUM: 4.6 mmol/L (ref 3.5–5.2)
SODIUM: 140 mmol/L (ref 134–144)

## 2016-07-12 ENCOUNTER — Other Ambulatory Visit: Payer: Self-pay | Admitting: Internal Medicine

## 2016-07-12 ENCOUNTER — Telehealth: Payer: Self-pay

## 2016-07-12 DIAGNOSIS — R7881 Bacteremia: Secondary | ICD-10-CM

## 2016-07-12 NOTE — Progress Notes (Signed)
Covering Dr. Perlie Kodi Guerrera box while she is out of town this week. Received an abnormal lab result from this patient. He was recently hospitalized and his final blood cultures resulted growing Corynebacterium in 1 out of 2 bottles. Per the hospital discharge summary, it was recommended that patient have repeat blood cultures drawn in clinic. Patient had a hospital follow-up visit on 5/7, but repeat cultures were not drawn.  Will place orders for patient to have repeat blood cultures done.  Hyman Bible, MD

## 2016-07-12 NOTE — Telephone Encounter (Signed)
Attempted to reach pt, however, no answer and vm has not been set up yet. Will continue to try. If pt happens to call back, please schedule him for a lab visit.

## 2016-07-12 NOTE — Telephone Encounter (Signed)
-----   Message from Sela Hua, MD sent at 07/12/2016 11:56 AM EDT ----- Patient was seen for hospital follow-up appointment on 5/7. He was supposed to have repeat blood cultures done at that visit, but these were never performed. I have placed an order for repeat blood cultures. Can we please call the patient and have him come into clinic for a lab visit to have these done? Thank you!

## 2016-07-17 ENCOUNTER — Inpatient Hospital Stay: Payer: Medicare Other | Admitting: Internal Medicine

## 2016-07-26 NOTE — Progress Notes (Signed)
   [facility] Phone: [phone]   Date of Visit: 07/28/2016   HPI:  Hip Pain:  - seen in clinic on 5/7 and had x-rays ordered of the hip which was done yesterday  - right hip pain for at least a few months.  - reports he cannot bear all of his weight on the right leg - pain is intermittent and characterizes it as throbbing pain that radiates to the right leg - no lower extremity weakness, no numbness of lower extremities, no urinary incontinence/retention or bowel incontinence.  - reports of history of falls but has not fallen since 02/2016. [name] daughter reports that his falls are due to orthostatic hypotension (chronic diagnosis) - ibuprofen only helps a little, as does Naproxen. Denies stomach upset, dark stools, or blood in stools - is avoiding tylenol due to history of liver cirrhosis. - Hip x-ray shows: AVN of the the right femur - daughter asks if it is fine to use biofreeze for pain as this does help some.   Hypothyroidism: - seen by Dr. Dwyane Dee. First diagnosed in 2012. Seen by Dr. Dwyane Dee in 05/2016 for Adrenal insufficiency, orthostatic hypotension, and hypothyroidism - last TSH 18.1 on 06/2015 - patient is taking synthroid as prescribed  - daughter reports she will make a follow up appointment  ROS: See HPI.  [place]:  PMH: HTN with hx of hypotension HLD HFrEF (NICM likely related to ETOH) with ECHO 2/106 with normal EF and G1DD ( lowest EF 30% in 12/2011) History of Laryngeal Carcinoma ( 2011) with recurrence Jan 2016 with  tracheoesophageal prosthesis s/p chemoradiation (completed May 2016) Hx of Pulmonary Nodule Chronic Pancreatitis Hypothyroidism Primary Adrenal Insufficiency OA Alcohol Dependence Protein Calorie Malnutrition Seizures Liver Cirrhosis? Former Tobacco Use (quit 2012) COPD? Hx of Hiatal Hernia Hx UTI (2012) Hx of Nephrolithiasis and hydronephrosis Hx of Abdominal Ascites (transudative 11/2010) Prediabes  (04/2014) Normocytic Anemia   PHYSICAL EXAM: BP 138/82   Pulse 61   Temp 98.1 F (36.7 C) (Oral)   Wt 123 lb (55.8 kg)   SpO2 90%   BMI 17.65 kg/m  GEN: NAD, thin CV: RRR, no murmurs, rubs, or gallops PULM: CTAB, normal effort ABD: Soft, PEG tube in place MSK: decreased passive ROM of the right hip due to pain (with flexsion and extension), pain with FABER and FADIR, no swelling or erythema. Tenderness to palpation of the anterior groin region at the proximal thigh and right upper buttock region. Strength is equal in both extremities. Straight leg raise is negative. DP pulses intact bilaterally SKIN: No rash or cyanosis; warm and well-perfused EXTR: No lower extremity edema or calf tenderness PSYCH: Mood and affect euthymic, normal rate and volume of speech NEURO: Awake, alert, no focal deficits grossly, normal speech   ASSESSMENT/PLAN:  Hip Pain: likely due to avascular necrosis.  - referral to orthopedic surgery - Voltaren gel and Biofreeze PRN   FOLLOW UP: Follow up for annual physical  Smiley Houseman, MD PGY [place]

## 2016-07-27 ENCOUNTER — Ambulatory Visit (HOSPITAL_COMMUNITY)
Admission: RE | Admit: 2016-07-27 | Discharge: 2016-07-27 | Disposition: A | Discharge status: Home / Self Care | Payer: Medicare Other | Source: Ambulatory Visit | Attending: Family Medicine | Admitting: Family Medicine

## 2016-07-27 DIAGNOSIS — M25551 Pain in right hip: Secondary | ICD-10-CM

## 2016-07-27 DIAGNOSIS — M87851 Other osteonecrosis, right femur: Secondary | ICD-10-CM | POA: Insufficient documentation

## 2016-07-28 ENCOUNTER — Encounter: Payer: Self-pay | Admitting: Internal Medicine

## 2016-07-28 ENCOUNTER — Ambulatory Visit (INDEPENDENT_AMBULATORY_CARE_PROVIDER_SITE_OTHER): Payer: Medicare Other | Admitting: Internal Medicine

## 2016-07-28 VITALS — BP 138/82 | HR 61 | Temp 98.1°F | Wt 123.0 lb

## 2016-07-28 DIAGNOSIS — M87051 Idiopathic aseptic necrosis of right femur: Secondary | ICD-10-CM | POA: Diagnosis present

## 2016-07-28 MED ORDER — MENTHOL (TOPICAL ANALGESIC) 4 % EX GEL
CUTANEOUS | 0 refills | Status: DC
Start: 2016-07-28 — End: 2017-01-12

## 2016-07-28 MED ORDER — DICLOFENAC SODIUM 1 % TD GEL: 2.0000 g | Freq: Three times a day (TID) | TRANSDERMAL | 0 refills | Status: DC | PRN

## 2016-07-28 NOTE — Patient Instructions (Addendum)
Please schedule a visit with Dr. Dwyane Dee.   Please make a follow up appointment with me for your physical   I made a referral to the orthopedic doctor for your hip

## 2016-08-03 ENCOUNTER — Other Ambulatory Visit: Payer: Self-pay | Admitting: Endocrinology

## 2016-08-09 ENCOUNTER — Ambulatory Visit (INDEPENDENT_AMBULATORY_CARE_PROVIDER_SITE_OTHER): Payer: Medicare Other | Admitting: Orthopaedic Surgery

## 2016-08-09 ENCOUNTER — Ambulatory Visit (INDEPENDENT_AMBULATORY_CARE_PROVIDER_SITE_OTHER): Payer: Medicare Other | Admitting: Family

## 2016-08-09 DIAGNOSIS — M87051 Idiopathic aseptic necrosis of right femur: Secondary | ICD-10-CM

## 2016-08-09 HISTORY — DX: Idiopathic aseptic necrosis of right femur: M87.051

## 2016-08-09 MED ORDER — TRAMADOL HCL 50 MG PO TABS: 100.0000 mg | ORAL_TABLET | Freq: Four times a day (QID) | ORAL | 0 refills | Status: DC | PRN

## 2016-08-09 NOTE — Progress Notes (Signed)
Office Visit Note   Patient: [name]           Date of Birth: [birth year]           MRN: [id number] Visit Date: 08/09/2016              Requested by: Smiley Houseman, [address] [place], [place] [id number] PCP: Smiley Houseman, MD   Assessment & Plan: Visit Diagnoses:  1. Avascular necrosis of hip, right (HCC)     Plan: Due to the severity of his pain and the fact that his right hip is showing some signs of femoral head collapse with avascular necrosis we are recommending a total hip replacement. He would certainly need an anesthesia consult before this given his tracheostomy. I showed him a hip model and had a long and thorough discussion about hip replacement surgery including a discussion of his intraoperative and postoperative course and a detailed discussion of the risk and benefits of the surgery. He does wish to proceed with this in the near future giving the disability is experiencing from the severe pain. He is going continue a walker to offset the pain in his hip and I'll put him on some tramadol in the interim. All questions were encouraged and answered.  Follow-Up Instructions: Return for 2 weeks post-op.   Orders:  No orders of the defined types were placed in this encounter.  No orders of the defined types were placed in this encounter.     Procedures: No procedures performed   Clinical Data: No additional findings.   Subjective: No chief complaint on file. The patient is very pleasant 66 year old male who was sent from the family medicine teaching service to [place] to evaluate his right hip. He has known avascular necrosis of the right hip and acute onset of pain. He is someone who has had a complicated medical history in the past with cirrhosis of liver, alcohol abuse, congestive heart failure in the past and laryngeal cancer. He does have a permanent tracheostomy. He's developed worsening pain over short period time of his right hip and hurts  in the groin he walks with a significant limp as well. He's had some falls in the past who does use a walker as well. He has had x-rays are on canopy system from interview that do show avascular necrosis of his right hip.  HPI  Review of Systems He denies any current acute issues and denies any headache, chest pain, shortness of breath, fever, chills, nausea, vomiting.  Objective: Vital Signs: There were no vitals taken for this visit.  Physical Exam He is alert and oriented 3 and in no acute distress Ortho Exam Examination of his left hip is normal. Examination of his right hip show severe pain on attempts of internal and external rotation. Specialty Comments:  No specialty comments available.  Imaging: No results found. X-rays and dependent was reviewed of his right hip shows significant avascular necrosis of the femoral head. There is cystic changes in the femoral head and some evidence of early femoral head collapse.  PMFS History: Patient Active Problem List   Diagnosis Date Noted  . Avascular necrosis of hip, right ([place]) 08/09/2016  . Cellulitis 07/10/2016  . Hypokalemia 07/10/2016  . Right hip pain 07/10/2016  . Erysipelas 07/05/2016  . Orthostatic hypotension 08/13/2014  . Abdominal distention 08/13/2014  . Weakness 08/13/2014  . Cancer associated pain   . Altered mental state   . Sepsis ([place]) 05/27/2014  .  SIRS (systemic inflammatory response syndrome) (HCC)   . Fever   . AKI (acute kidney injury) ([place])   . Throat cancer ([place])   . Esophageal mass   . Weight loss   . Odynophagia   . Protein-calorie malnutrition, severe ([place]) 04/10/2014  . Syncope 04/09/2014  . LOC (loss of consciousness) ([place]) 04/09/2014  . Anorexia   . Pancreatitis, chronic ([place]) 05/20/2013  . Primary adrenal insufficiency ([place]) 05/20/2013  . Hyperlipidemia   . Osteoarthritis   . Hypotension 05/10/2013  . Dehydration 05/08/2013  . Loss of weight 03/12/2013  . Elevated serum creatinine  11/28/2012  . Chronic systolic heart failure ([place]) 01/04/2012  . Alcohol dependence ([place]) 01/04/2012  . Decreased appetite 01/03/2012  . Acute and chronic respiratory failure 12/26/2011  . 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 Hypothyroidism 10/16/2010  . Malignant neoplasm of larynx ([place]) 01/10/2010  . PULMONARY NODULE 12/31/2007  . Essential hypertension, benign 11/08/2007   Past Medical History:  Diagnosis Date  . Alcohol abuse   . Arthritis   . CHF (congestive heart failure) ([place])    EF 45-50% 05/2012  . Cirrhosis of liver ([place])   . Hyperlipidemia   . Hypertension   . Hypothyroidism   . Laryngeal cancer ([place]) 2011   Laryngectomy, T3N0M0  . Seizures ([place])     Family History  Problem Relation Age of Onset  . Hyperlipidemia Mother   . Hypertension Mother   . Hypertension Father   . Hyperlipidemia Father     Past Surgical History:  Procedure Laterality Date  . ESOPHAGOGASTRODUODENOSCOPY N/A 04/10/2014   Procedure: ESOPHAGOGASTRODUODENOSCOPY (EGD);  Surgeon: Missy Sabins, MD;  Location: [facility] ENDOSCOPY;  Service: Endoscopy;  Laterality: N/A;  . LEFT AND RIGHT HEART CATHETERIZATION WITH CORONARY ANGIOGRAM N/A 12/28/2011   Procedure: LEFT AND RIGHT HEART CATHETERIZATION WITH CORONARY ANGIOGRAM;  Surgeon: Jolaine Artist, MD;  Location: [facility] CATH LAB;  Service: Cardiovascular;  Laterality: N/A;  . TRACHEAL SURGERY    . TRACHEOSTOMY     Social History   Occupational History  . Not on file.   Social History Main Topics  . Smoking status: Former Smoker    Packs/day: 0.20    Years: 50.00    Types: Cigarettes  . Smokeless tobacco: Never Used  . Alcohol use Yes     Comment: "rarely"  . Drug use: No  . Sexual activity: Not Currently

## 2016-08-15 ENCOUNTER — Encounter: Payer: Medicare Other | Admitting: Internal Medicine

## 2016-08-15 NOTE — Progress Notes (Deleted)
   [facility]           Phone: [phone]  Date of Visit: 08/15/2016   HPI:  Patient presents today for a well adult male exam.   Concerns today: *** Sexual activity: *** STD Screening: *** Exercise: *** Diet: *** Smoking: *** Alcohol: *** Drugs: *** Advance directives: *** Mood: ***  ROS: See HPI  [place]:  Cancers in family: ***  PMH: HTN with hx of hypotension HLD HFrEF (NICM likely related to ETOH) with ECHO 2/106 with normal EF and G1DD ( lowest EF 30% in 12/2011) History of Laryngeal Carcinoma ( 2011) with recurrence Jan 2016 with  tracheoesophageal prosthesis s/p chemoradiation (completed May 2016) Hx of Pulmonary Nodule Chronic Pancreatitis Hypothyroidism Primary Adrenal Insufficiency OA Alcohol Dependence Protein Calorie Malnutrition Seizures Liver Cirrhosis? Former Tobacco Use (quit 2012) COPD? Hx of Hiatal Hernia Hx UTI (2012) Hx of Nephrolithiasis and hydronephrosis Hx of Abdominal Ascites (transudative 11/2010) Prediabes (04/2014) Normocytic Anemia  PHYSICAL EXAM: There were no vitals taken for this visit. Gen: NAD, pleasant, cooperative HEENT: NCAT, PERRL, no palpable thyromegaly or anterior cervical lymphadenopathy Heart: RRR, no murmurs Lungs: CTAB, NWOB Abdomen: soft, nontender to palpation Neuro: grossly nonfocal, speech normal GU: ***  ASSESSMENT/PLAN:  # Health maintenance:  -STD screening: *** -immunizations -lipid screening: *** -colonoscopy: *** -advance directives: *** -handout given on health maintenance topics  FOLLOW UP: Follow up in *** for ***  Smiley Houseman, MD PGY [facility]

## 2016-09-04 ENCOUNTER — Other Ambulatory Visit: Payer: Self-pay | Admitting: Endocrinology

## 2016-09-04 NOTE — Progress Notes (Signed)
Please place orders in EPIC as patient is being scheduled for a pre-op appointment! Thank you! 

## 2016-09-07 ENCOUNTER — Other Ambulatory Visit (INDEPENDENT_AMBULATORY_CARE_PROVIDER_SITE_OTHER): Payer: Self-pay | Admitting: Orthopaedic Surgery

## 2016-09-07 ENCOUNTER — Other Ambulatory Visit (HOSPITAL_COMMUNITY): Payer: Self-pay | Admitting: Emergency Medicine

## 2016-09-07 ENCOUNTER — Encounter (HOSPITAL_COMMUNITY)
Admission: RE | Admit: 2016-09-07 | Discharge: 2016-09-07 | Disposition: A | Discharge status: Home / Self Care | Payer: Medicare Other | Source: Ambulatory Visit | Attending: Orthopaedic Surgery | Admitting: Orthopaedic Surgery

## 2016-09-07 ENCOUNTER — Encounter (HOSPITAL_COMMUNITY): Payer: Self-pay

## 2016-09-07 DIAGNOSIS — M8788 Other osteonecrosis, other site: Secondary | ICD-10-CM | POA: Insufficient documentation

## 2016-09-07 DIAGNOSIS — Z01818 Encounter for other preprocedural examination: Secondary | ICD-10-CM | POA: Diagnosis not present

## 2016-09-07 DIAGNOSIS — M87051 Idiopathic aseptic necrosis of right femur: Secondary | ICD-10-CM

## 2016-09-07 HISTORY — DX: Other complications of anesthesia, initial encounter: T88.59XA

## 2016-09-07 HISTORY — DX: Adverse effect of unspecified anesthetic, initial encounter: T41.45XA

## 2016-09-07 LAB — CBC
HCT: 36.8 % — ABNORMAL LOW (ref 39.0–52.0)
HEMOGLOBIN: 12.4 g/dL — AB (ref 13.0–17.0)
MCH: 30 pg (ref 26.0–34.0)
MCHC: 33.7 g/dL (ref 30.0–36.0)
MCV: 88.9 fL (ref 78.0–100.0)
Platelets: 247 10*3/uL (ref 150–400)
RBC: 4.14 MIL/uL — ABNORMAL LOW (ref 4.22–5.81)
RDW: 14.3 % (ref 11.5–15.5)
WBC: 6 10*3/uL (ref 4.0–10.5)

## 2016-09-07 LAB — COMPREHENSIVE METABOLIC PANEL
ALBUMIN: 3.4 g/dL — AB (ref 3.5–5.0)
ALK PHOS: 118 U/L (ref 38–126)
ALT: 16 U/L — AB (ref 17–63)
AST: 22 U/L (ref 15–41)
Anion gap: 9 (ref 5–15)
BUN: 11 mg/dL (ref 6–20)
CHLORIDE: 104 mmol/L (ref 101–111)
CO2: 26 mmol/L (ref 22–32)
CREATININE: 1.1 mg/dL (ref 0.61–1.24)
Calcium: 9.3 mg/dL (ref 8.9–10.3)
GFR calc Af Amer: >60 mL/min (ref >60)
GFR calc non Af Amer: >60 mL/min (ref >60)
GLUCOSE: 97 mg/dL (ref 65–99)
Potassium: 4.5 mmol/L (ref 3.5–5.1)
SODIUM: 139 mmol/L (ref 135–145)
Total Bilirubin: 0.8 mg/dL (ref 0.3–1.2)
Total Protein: 8.2 g/dL — ABNORMAL HIGH (ref 6.5–8.1)

## 2016-09-07 LAB — SURGICAL PCR SCREEN
MRSA, PCR: POSITIVE — AB
Staphylococcus aureus: POSITIVE — AB

## 2016-09-07 NOTE — Progress Notes (Addendum)
Patient surgeon Dr Jean Rosenthal requesting anesthesia consult d/t presence of tracheotomy tube and hx of severe adverse reaction to anesthesia agent. At pre-op appt, eldest daughter [name] present and reports that patient had surgery for tracheostomy tube and went into cardiac arrest after introduction of anesthetic agent for which surgery was postponed. Patient recovered well in medical ICU following event. RN spoke with Anesthesia Dr Ambrose Pancoast about patient situation. Dr Ambrose Pancoast then came to pre-op room for face to face with patient and his daughter in company of [place]. Patient and daughter state surgery was done in 2012, however unaware of what specific agent that caused cardiac event. Dr Ambrose Pancoast informed patent of benefit of knowing specific name to avoid any unintentional administration. However since patient has since been under anesthesia multiple timeS since 2012 without any reaction. Dr Ambrose Pancoast informed patient that likely they will review anesthesia agents used with most recent surgery to assure patient not exposed to agent that potentially caused cardiac event spinal agent be used if possible rather than general anesthetic. Patient and daughter verbalized understanding and are agreeable to this plan..  RN suggested requesting record from [facility] of trach surgery  anesthesia record to attempt to find name of responsible agent. Thus RN office of team that was treating patient for laryngeal cancer at the time. This Rn spoke with another RN at the office who suggested looking further back than 2012 for record. It was then discovered 1st attempt of trach surgery was actually in 2011 and patient was treated for "succinylcholine induced pulseless electrical activity secondary to probable hyperkalemia". After this discovery , RN caught up with patient and family in front of valet service area and gave them the name of the medication and correct year of cardiac event . RN then called Dr Ambrose Pancoast and  explained the new discovery and and anesthesia culprit . RN then placed succinylcholine under adverse reaction with high severity  in allergies section of medical record and in ANESTHESIA HISTORY section of history tab in epic.

## 2016-09-07 NOTE — Patient Instructions (Addendum)
[name]  09/07/2016   Your procedure is scheduled on: Friday 09/15/2016  Report to [facility] Main  Entrance Take [place]  elevators to 3rd floor to  [place] at 0800 AM.    Call this number if you have problems the morning of surgery [phone]    Remember: ONLY 1 PERSON MAY GO WITH YOU TO SHORT STAY TO GET  READY MORNING OF [place].   Do not eat food or drink liquids :After Midnight.     Take these medicines the morning of surgery with A SIP OF WATER: Amlodipine  (Norvasc), Bisoprolol (Zebeta), Levothyroxine (Synthroid)                                 You may not have any metal on your body including hair pins and              piercings  Do not wear jewelry, make-up, lotions, powders or perfumes, deodorant                         Men may shave face and neck.   Do not bring valuables to the hospital. [place].  Contacts, dentures or bridgework may not be worn into surgery.  Leave suitcase in the car. After surgery it may be brought to your room.                Please read over the following fact sheets you were given: _____________________________________________________________________             [facility] - Preparing for Surgery Before surgery, you can play an important role.  Because skin is not sterile, your skin needs to be as free of germs as possible.  You can reduce the number of germs on your skin by washing with CHG (chlorahexidine gluconate) soap before surgery.  CHG is an antiseptic cleaner which kills germs and bonds with the skin to continue killing germs even after washing. Please DO NOT use if you have an allergy to CHG or antibacterial soaps.  If your skin becomes reddened/irritated stop using the CHG and inform your nurse when you arrive at Short Stay. Do not shave (including legs and underarms) for at least 48 hours prior to the first CHG shower.  You may  shave your face/neck. Please follow these instructions carefully:  1.  Shower with CHG Soap the night before surgery and the  morning of Surgery.  2.  If you choose to wash your hair, wash your hair first as usual with your  normal  shampoo.  3.  After you shampoo, rinse your hair and body thoroughly to remove the  shampoo.                           4.  Use CHG as you would any other liquid soap.  You can apply chg directly  to the skin and wash                       Gently with a scrungie or clean washcloth.  5.  Apply the CHG Soap to your body ONLY FROM THE  NECK DOWN.   Do not use on face/ open                           Wound or open sores. Avoid contact with eyes, ears mouth and genitals (private parts).                       Wash face,  Genitals (private parts) with your normal soap.             6.  Wash thoroughly, paying special attention to the area where your surgery  will be performed.  7.  Thoroughly rinse your body with warm water from the neck down.  8.  DO NOT shower/wash with your normal soap after using and rinsing off  the CHG Soap.                9.  Pat yourself dry with a clean towel.            10.  Wear clean pajamas.            11.  Place clean sheets on your bed the night of your first shower and do not  sleep with pets. Day of Surgery : Do not apply any lotions/deodorants the morning of surgery.  Please wear clean clothes to the hospital/surgery center.   FAILURE TO FOLLOW THESE INSTRUCTIONS MAY RESULT IN THE CANCELLATION OF YOUR SURGERY PATIENT SIGNATURE_________________________________  NURSE SIGNATURE__________________________________  ________________________________________________________________________

## 2016-09-07 NOTE — Progress Notes (Signed)
07/04/2016- noted in [facility] 07/05/2016- noted in [facility]

## 2016-09-07 NOTE — Progress Notes (Signed)
Please place orders in EPIC as patient has a pre-op appointment on 09/08/2016! Thank you! 

## 2016-09-07 NOTE — Anesthesia Preprocedure Evaluation (Addendum)
Anesthesia Evaluation  Patient identified by MRN, date of birth, ID band Patient awake    Reviewed: Allergy & Precautions, NPO status , [name] Chart, lab work & pertinent test results  History of Anesthesia Complications (+) history of anesthetic complications  Airway Mallampati: II  TM Distance: >3 FB Neck ROM: Full    Dental  (+) Dental Advisory Given   Pulmonary former smoker,  S/p laryngectomy   breath sounds clear to auscultation       Cardiovascular hypertension,  Rhythm:Regular Rate:Normal     Neuro/Psych negative neurological ROS     GI/Hepatic negative GI ROS, Neg liver ROS,   Endo/Other  Hypothyroidism   Renal/GU negative Renal ROS     Musculoskeletal  (+) Arthritis ,   Abdominal   Peds  Hematology negative hematology ROS (+)   Anesthesia Other Findings   Reproductive/Obstetrics                           Lab Results  Component Value Date   WBC 6.0 09/07/2016   HGB 12.4 (L) 09/07/2016   HCT 36.8 (L) 09/07/2016   MCV 88.9 09/07/2016   PLT 247 09/07/2016   Lab Results  Component Value Date   CREATININE 1.10 09/07/2016   BUN 11 09/07/2016   NA 139 09/07/2016   K 4.5 09/07/2016   CL 104 09/07/2016   CO2 26 09/07/2016    Anesthesia Physical Anesthesia Plan  ASA: III  Anesthesia Plan: MAC and Spinal   Post-op Pain Management:    Induction: Intravenous  PONV Risk Score and Plan: 3 and Ondansetron, Dexamethasone, Propofol and Midazolam  Airway Management Planned: Natural Airway and Simple Face Mask  Additional Equipment:   Intra-op Plan:   Post-operative Plan:   Informed Consent: I have reviewed the patients History and Physical, chart, labs and discussed the procedure including the risks, benefits and alternatives for the proposed anesthesia with the patient or authorized representative who has indicated his/her understanding and acceptance.     Plan  Discussed with:   Anesthesia Plan Comments:        Anesthesia Quick Evaluation

## 2016-09-08 ENCOUNTER — Other Ambulatory Visit (INDEPENDENT_AMBULATORY_CARE_PROVIDER_SITE_OTHER): Payer: Self-pay | Admitting: Orthopaedic Surgery

## 2016-09-08 ENCOUNTER — Encounter (HOSPITAL_COMMUNITY): Admission: RE | Admit: 2016-09-08 | Payer: Medicare Other | Source: Ambulatory Visit

## 2016-09-08 LAB — ABO/RH: ABO/RH(D): A POS

## 2016-09-14 NOTE — H&P (Signed)
TOTAL HIP ADMISSION H&P  Patient is admitted for right total hip arthroplasty.  Subjective:  Chief Complaint: right hip pain  HPI: [name], 66 y.o. male, has a history of pain and functional disability in the right hip(s) due to avascular necrosis and patient has failed non-surgical conservative treatments for greater than 12 weeks to include use of assistive devices and activity modification.  Onset of symptoms was abrupt starting 1 years ago with rapidlly worsening course since that time.The patient noted no past surgery on the right hip(s).  Patient currently rates pain in the right hip at 10 out of 10 with activity. Patient has night pain, worsening of pain with activity and weight bearing, trendelenberg gait, pain that interfers with activities of daily living and pain with passive range of motion. Patient has evidence of subchondral cysts and joint space narrowing by imaging studies. This condition presents safety issues increasing the risk of falls.  There is no current active infection.  Patient Active Problem List   Diagnosis Date Noted  . Avascular necrosis of hip, right ([place]) 08/09/2016  . Cellulitis 07/10/2016  . Hypokalemia 07/10/2016  . Right hip pain 07/10/2016  . Erysipelas 07/05/2016  . Orthostatic hypotension 08/13/2014  . Abdominal distention 08/13/2014  . Weakness 08/13/2014  . Cancer associated pain   . Altered mental state   . Sepsis ([place]) 05/27/2014  . SIRS (systemic inflammatory response syndrome) (H[facility])   . Fever   . AKI (acute kidney injury) ([place])   . Throat cancer ([place])   . Esophageal mass   . Weight loss   . Odynophagia   . Protein-calorie malnutrition, severe ([place]) 04/10/2014  . Syncope 04/09/2014  . LOC (loss of consciousness) ([place]) 04/09/2014  . Anorexia   . Pancreatitis, chronic ([place]) 05/20/2013  . Primary adrenal insufficiency ([place]) 05/20/2013  . Hyperlipidemia   . Osteoarthritis   . Hypotension 05/10/2013  . Dehydration 05/08/2013  .  Loss of weight 03/12/2013  . Elevated serum creatinine 11/28/2012  . Chronic systolic heart failure ([place]) 01/04/2012  . Alcohol dependence ([place]) 01/04/2012  . Decreased appetite 01/03/2012  . Acute and chronic respiratory failure 12/26/2011  . 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 Hypothyroidism 10/16/2010  . Malignant neoplasm of larynx ([place]) 01/10/2010  . PULMONARY NODULE 12/31/2007  . Essential hypertension, benign 11/08/2007   Past Medical History:  Diagnosis Date  . Alcohol abuse   . Arthritis   . CHF (congestive heart failure) ([place])    EF 45-50% 05/2012  . Cirrhosis of liver ([place])   . Complication of anesthesia    SUCCINYLCHOLINE ADVERSE REACTION (not an allergy) Following 1st attempt to place tracheotomy tube in 2011 at [facility]; patient went into "succinylcholine induced pulseless electrical activity secondary to probable hyperkalemia" and underwent chest compressions and placement of endotracheal tube with admission to medical ICU.  EXTREMELY HIGH SEVERE REACTION.   Marland Kitchen Hyperlipidemia   . Hypertension   . Hypothyroidism   . Laryngeal cancer ([place]) 2011   Laryngectomy, T3N0M0  . Seizures ([place])     Past Surgical History:  Procedure Laterality Date  . ESOPHAGOGASTRODUODENOSCOPY N/A 04/10/2014   Procedure: ESOPHAGOGASTRODUODENOSCOPY (EGD);  Surgeon: Missy Sabins, MD;  Location: [facility] ENDOSCOPY;  Service: Endoscopy;  Laterality: N/A;  . LEFT AND RIGHT HEART CATHETERIZATION WITH CORONARY ANGIOGRAM N/A 12/28/2011   Procedure: LEFT AND RIGHT HEART CATHETERIZATION WITH CORONARY ANGIOGRAM;  Surgeon: Jolaine Artist, MD;  Location: [facility] CATH LAB;  Service: Cardiovascular;  Laterality: N/A;  . TRACHEAL SURGERY    . TRACHEOSTOMY      No prescriptions prior to admission.   Allergies  Allergen Reactions  . Succinylcholine Other (See Comments)    ADVERSE REACTION (not an allergy) Following 1st attempt to place tracheotomy tube  in 2011 at [facility]; patient went into "succinylcholine induced pulseless electrical activity secondary to probable hyperkalemia" and underwent chest compressions and placement of endotracheal tube with admission to medical ICU.  EXTREMELY HIGH SEVERE REACTION.   Marland Kitchen Tylenol [Acetaminophen] Other (See Comments) and Hypertension    HBP -Liver Cirrhosis  . Vicodin [Hydrocodone-Acetaminophen] Rash  . Other     Unknown anesthesia medicine caused cardiac arrest.    Social History  Substance Use Topics  . Smoking status: Former Smoker    Packs/day: 0.20    Years: 50.00    Types: Cigarettes  . Smokeless tobacco: Never Used  . Alcohol use Yes    Family History  Problem Relation Age of Onset  . Hyperlipidemia Mother   . Hypertension Mother   . Hypertension Father   . Hyperlipidemia Father      Review of Systems  Gastrointestinal: Positive for abdominal pain.  Musculoskeletal: Positive for joint pain and myalgias.  Neurological: Positive for weakness.    Objective:  Physical Exam  Constitutional: He is oriented to person, place, and time. He appears well-developed.  HENT:  Head: Normocephalic and atraumatic.  Eyes: Pupils are equal, round, and reactive to light.  Neck: Normal range of motion.  Cardiovascular: Normal rate.   Respiratory: Effort normal and breath sounds normal.  GI: Soft. Bowel sounds are normal.  Musculoskeletal:       Right hip: He exhibits decreased range of motion, decreased strength, tenderness and bony tenderness.  Neurological: He is alert and oriented to person, place, and time.  Skin: Skin is warm and dry.  Psychiatric: He has a normal mood and affect.    Vital signs in last 24 hours:    Labs:   Estimated body mass index is 18.31 kg/m as calculated from the following:   Height as of 09/07/16: 5\' 9"  (1.753 m).   Weight as of 09/07/16: 124 lb (56.2 kg).   Imaging Review Plain radiographs demonstrate severe avascular necrosis of the right hip  with near collapse of the femoral head.  Assessment/Plan:  End stage AVN, right hip(s)  The patient history, physical examination, clinical judgement of the provider and imaging studies are consistent with end stage avascular necrosis of the right hip(s) and total hip arthroplasty is deemed medically necessary. The treatment options including medical management, injection therapy, arthroscopy and arthroplasty were discussed at length. The risks and benefits of total hip arthroplasty were presented and reviewed. The risks due to aseptic loosening, infection, stiffness, dislocation/subluxation,  thromboembolic complications and other imponderables were discussed.  The patient acknowledged the explanation, agreed to proceed with the plan and consent was signed. Patient is being admitted for inpatient treatment for surgery, pain control, PT, OT, prophylactic antibiotics, VTE prophylaxis, progressive ambulation and ADL's and discharge planning.The patient is planning to be discharged home with home health services

## 2016-09-14 NOTE — Progress Notes (Signed)
RN called and spoke to patients daughter [name] ( primary caregiver that manages all his medical concerns) and updated her on the surgery time change from 10am to 0940  And that Mr Fiorella would need to arrive for check in at 0740 now

## 2016-09-15 ENCOUNTER — Inpatient Hospital Stay (HOSPITAL_COMMUNITY): Payer: Medicare Other

## 2016-09-15 ENCOUNTER — Inpatient Hospital Stay (HOSPITAL_COMMUNITY)
Admission: RE | Admit: 2016-09-15 | Discharge: 2016-09-18 | DRG: 470 | Disposition: A | Discharge status: Home Health | Payer: Medicare Other | Source: Ambulatory Visit | Attending: Orthopaedic Surgery | Admitting: Orthopaedic Surgery

## 2016-09-15 ENCOUNTER — Encounter (HOSPITAL_COMMUNITY): Payer: Self-pay | Admitting: *Deleted

## 2016-09-15 ENCOUNTER — Encounter (HOSPITAL_COMMUNITY)
Admission: RE | Disposition: A | Discharge status: Home Health | Payer: Self-pay | Source: Ambulatory Visit | Attending: Orthopaedic Surgery

## 2016-09-15 ENCOUNTER — Inpatient Hospital Stay (HOSPITAL_COMMUNITY): Payer: Medicare Other | Admitting: Anesthesiology

## 2016-09-15 DIAGNOSIS — Z8249 Family history of ischemic heart disease and other diseases of the circulatory system: Secondary | ICD-10-CM | POA: Diagnosis not present

## 2016-09-15 DIAGNOSIS — M87051 Idiopathic aseptic necrosis of right femur: Secondary | ICD-10-CM | POA: Diagnosis present

## 2016-09-15 DIAGNOSIS — Z87891 Personal history of nicotine dependence: Secondary | ICD-10-CM

## 2016-09-15 DIAGNOSIS — Z96641 Presence of right artificial hip joint: Secondary | ICD-10-CM

## 2016-09-15 DIAGNOSIS — K7030 Alcoholic cirrhosis of liver without ascites: Secondary | ICD-10-CM | POA: Diagnosis not present

## 2016-09-15 DIAGNOSIS — M87351 Other secondary osteonecrosis, right femur: Secondary | ICD-10-CM | POA: Diagnosis not present

## 2016-09-15 DIAGNOSIS — M1990 Unspecified osteoarthritis, unspecified site: Secondary | ICD-10-CM | POA: Diagnosis present

## 2016-09-15 DIAGNOSIS — E039 Hypothyroidism, unspecified: Secondary | ICD-10-CM | POA: Diagnosis not present

## 2016-09-15 DIAGNOSIS — Z888 Allergy status to other drugs, medicaments and biological substances status: Secondary | ICD-10-CM | POA: Diagnosis not present

## 2016-09-15 DIAGNOSIS — Z419 Encounter for procedure for purposes other than remedying health state, unspecified: Secondary | ICD-10-CM

## 2016-09-15 DIAGNOSIS — Z8521 Personal history of malignant neoplasm of larynx: Secondary | ICD-10-CM | POA: Diagnosis not present

## 2016-09-15 DIAGNOSIS — F1021 Alcohol dependence, in remission: Secondary | ICD-10-CM | POA: Diagnosis present

## 2016-09-15 DIAGNOSIS — Z8349 Family history of other endocrine, nutritional and metabolic diseases: Secondary | ICD-10-CM | POA: Diagnosis not present

## 2016-09-15 DIAGNOSIS — I110 Hypertensive heart disease with heart failure: Secondary | ICD-10-CM | POA: Diagnosis not present

## 2016-09-15 DIAGNOSIS — Z471 Aftercare following joint replacement surgery: Secondary | ICD-10-CM | POA: Diagnosis not present

## 2016-09-15 DIAGNOSIS — Z9181 History of falling: Secondary | ICD-10-CM

## 2016-09-15 DIAGNOSIS — K861 Other chronic pancreatitis: Secondary | ICD-10-CM | POA: Diagnosis present

## 2016-09-15 DIAGNOSIS — Z885 Allergy status to narcotic agent status: Secondary | ICD-10-CM

## 2016-09-15 DIAGNOSIS — I5023 Acute on chronic systolic (congestive) heart failure: Secondary | ICD-10-CM | POA: Diagnosis not present

## 2016-09-15 DIAGNOSIS — I5022 Chronic systolic (congestive) heart failure: Secondary | ICD-10-CM | POA: Diagnosis not present

## 2016-09-15 HISTORY — PX: TOTAL HIP ARTHROPLASTY: SHX124

## 2016-09-15 LAB — TYPE AND SCREEN
ABO/RH(D): A POS
Antibody Screen: NEGATIVE

## 2016-09-15 SURGERY — ARTHROPLASTY, HIP, TOTAL, ANTERIOR APPROACH
Anesthesia: Spinal | Site: Hip | Laterality: Right

## 2016-09-15 MED ORDER — FENTANYL CITRATE (PF) 100 MCG/2ML IJ SOLN
INTRAMUSCULAR | Status: AC
  Filled 2016-09-15: qty 2

## 2016-09-15 MED ORDER — HYDROMORPHONE HCL-NACL 0.5-0.9 MG/ML-% IV SOSY
1.0000 mg | PREFILLED_SYRINGE | INTRAVENOUS | Status: DC | PRN
  Administered 2016-09-15 – 2016-09-16 (×2): 1 mg via INTRAVENOUS
  Filled 2016-09-15 (×2): qty 2

## 2016-09-15 MED ORDER — GUAIFENESIN ER 600 MG PO TB12
600.0000 mg | ORAL_TABLET | Freq: Two times a day (BID) | ORAL | Status: DC
  Administered 2016-09-15 – 2016-09-18 (×6): 600 mg via ORAL
  Filled 2016-09-15 (×6): qty 1

## 2016-09-15 MED ORDER — OXYCODONE HCL 5 MG PO TABS
5.0000 mg | ORAL_TABLET | ORAL | Status: DC | PRN
  Administered 2016-09-15: 10 mg via ORAL
  Administered 2016-09-15 (×2): 5 mg via ORAL
  Administered 2016-09-15 – 2016-09-17 (×7): 10 mg via ORAL
  Administered 2016-09-17: 5 mg via ORAL
  Administered 2016-09-17 – 2016-09-18 (×6): 10 mg via ORAL
  Filled 2016-09-15 (×2): qty 2
  Filled 2016-09-15: qty 1
  Filled 2016-09-15 (×2): qty 2
  Filled 2016-09-15 (×2): qty 1
  Filled 2016-09-15 (×11): qty 2

## 2016-09-15 MED ORDER — METOCLOPRAMIDE HCL 5 MG/ML IJ SOLN: 5.0000 mg | Freq: Three times a day (TID) | INTRAMUSCULAR | Status: DC | PRN

## 2016-09-15 MED ORDER — MIDAZOLAM HCL 5 MG/5ML IJ SOLN
INTRAMUSCULAR | Status: DC | PRN
  Administered 2016-09-15 (×2): 1 mg via INTRAVENOUS

## 2016-09-15 MED ORDER — SODIUM CHLORIDE 0.9 % IR SOLN
Status: DC | PRN
  Administered 2016-09-15: 1000 mL

## 2016-09-15 MED ORDER — MENTHOL 3 MG MT LOZG: 1.0000 | LOZENGE | OROMUCOSAL | Status: DC | PRN

## 2016-09-15 MED ORDER — PHENYLEPHRINE HCL 10 MG/ML IJ SOLN
INTRAVENOUS | Status: DC | PRN
  Administered 2016-09-15: 20 ug/min via INTRAVENOUS

## 2016-09-15 MED ORDER — METHOCARBAMOL 500 MG PO TABS
500.0000 mg | ORAL_TABLET | Freq: Four times a day (QID) | ORAL | Status: DC | PRN
  Administered 2016-09-16 – 2016-09-18 (×4): 500 mg via ORAL
  Filled 2016-09-15 (×4): qty 1

## 2016-09-15 MED ORDER — PROPOFOL 500 MG/50ML IV EMUL
INTRAVENOUS | Status: DC | PRN
  Administered 2016-09-15: 50 ug/kg/min via INTRAVENOUS

## 2016-09-15 MED ORDER — DIPHENHYDRAMINE HCL 12.5 MG/5ML PO ELIX: 12.5000 mg | ORAL_SOLUTION | ORAL | Status: DC | PRN

## 2016-09-15 MED ORDER — POTASSIUM CHLORIDE ER 20 MEQ PO TBCR: 1.0000 | EXTENDED_RELEASE_TABLET | Freq: Every day | ORAL | Status: DC

## 2016-09-15 MED ORDER — BISOPROLOL FUMARATE 5 MG PO TABS
10.0000 mg | ORAL_TABLET | Freq: Every day | ORAL | Status: DC
  Administered 2016-09-16 – 2016-09-18 (×3): 10 mg via ORAL
  Filled 2016-09-15 (×3): qty 2

## 2016-09-15 MED ORDER — MIDAZOLAM HCL 2 MG/2ML IJ SOLN
INTRAMUSCULAR | Status: AC
  Filled 2016-09-15: qty 2

## 2016-09-15 MED ORDER — HYDROMORPHONE HCL-NACL 0.5-0.9 MG/ML-% IV SOSY: 0.2500 mg | PREFILLED_SYRINGE | INTRAVENOUS | Status: DC | PRN

## 2016-09-15 MED ORDER — AMLODIPINE BESYLATE 5 MG PO TABS
5.0000 mg | ORAL_TABLET | Freq: Every day | ORAL | Status: DC
  Administered 2016-09-16 – 2016-09-17 (×2): 5 mg via ORAL
  Filled 2016-09-15 (×3): qty 1

## 2016-09-15 MED ORDER — ONDANSETRON HCL 4 MG PO TABS: 4.0000 mg | ORAL_TABLET | Freq: Four times a day (QID) | ORAL | Status: DC | PRN

## 2016-09-15 MED ORDER — PHENYLEPHRINE HCL 10 MG/ML IJ SOLN
INTRAMUSCULAR | Status: DC | PRN
  Administered 2016-09-15 (×2): 80 ug via INTRAVENOUS

## 2016-09-15 MED ORDER — ONDANSETRON HCL 4 MG/2ML IJ SOLN: 4.0000 mg | Freq: Four times a day (QID) | INTRAMUSCULAR | Status: DC | PRN

## 2016-09-15 MED ORDER — TRANEXAMIC ACID 1000 MG/10ML IV SOLN
1000.0000 mg | INTRAVENOUS | Status: AC
  Administered 2016-09-15: 1000 mg via INTRAVENOUS
  Filled 2016-09-15: qty 1100
  Filled 2016-09-15: qty 10

## 2016-09-15 MED ORDER — SODIUM CHLORIDE 0.9 % IV SOLN
INTRAVENOUS | Status: DC
  Administered 2016-09-15 – 2016-09-16 (×2): via INTRAVENOUS

## 2016-09-15 MED ORDER — CEFAZOLIN SODIUM-DEXTROSE 2-4 GM/100ML-% IV SOLN
2.0000 g | INTRAVENOUS | Status: DC
  Filled 2016-09-15: qty 100

## 2016-09-15 MED ORDER — CHLORHEXIDINE GLUCONATE CLOTH 2 % EX PADS
6.0000 | MEDICATED_PAD | Freq: Every day | CUTANEOUS | Status: DC
  Administered 2016-09-17 – 2016-09-18 (×2): 6 via TOPICAL

## 2016-09-15 MED ORDER — ASPIRIN 81 MG PO CHEW
81.0000 mg | CHEWABLE_TABLET | Freq: Two times a day (BID) | ORAL | Status: DC
  Administered 2016-09-15 – 2016-09-18 (×6): 81 mg via ORAL
  Filled 2016-09-15 (×6): qty 1

## 2016-09-15 MED ORDER — POTASSIUM CHLORIDE CRYS ER 20 MEQ PO TBCR
20.0000 meq | EXTENDED_RELEASE_TABLET | Freq: Every day | ORAL | Status: DC
  Administered 2016-09-16 – 2016-09-18 (×3): 20 meq via ORAL
  Filled 2016-09-15 (×3): qty 1

## 2016-09-15 MED ORDER — PHENYLEPHRINE HCL 10 MG/ML IJ SOLN
INTRAMUSCULAR | Status: AC
  Filled 2016-09-15: qty 1

## 2016-09-15 MED ORDER — MUPIROCIN CALCIUM 2 % EX CREA
TOPICAL_CREAM | Freq: Two times a day (BID) | CUTANEOUS | Status: DC
  Administered 2016-09-16 – 2016-09-18 (×4): via TOPICAL
  Filled 2016-09-15: qty 15

## 2016-09-15 MED ORDER — MUPIROCIN 2 % EX OINT
TOPICAL_OINTMENT | CUTANEOUS | Status: AC
  Administered 2016-09-16
  Filled 2016-09-15: qty 22

## 2016-09-15 MED ORDER — FENTANYL CITRATE (PF) 100 MCG/2ML IJ SOLN
INTRAMUSCULAR | Status: DC | PRN
  Administered 2016-09-15 (×2): 50 ug via INTRAVENOUS

## 2016-09-15 MED ORDER — ALUM & MAG HYDROXIDE-SIMETH 200-200-20 MG/5ML PO SUSP: 30.0000 mL | ORAL | Status: DC | PRN

## 2016-09-15 MED ORDER — SODIUM CHLORIDE 0.9 % IR SOLN
Status: DC | PRN
Start: 2016-09-15 — End: 2016-09-15
  Administered 2016-09-15: 1000 mL

## 2016-09-15 MED ORDER — METOCLOPRAMIDE HCL 5 MG PO TABS: 5.0000 mg | ORAL_TABLET | Freq: Three times a day (TID) | ORAL | Status: DC | PRN

## 2016-09-15 MED ORDER — METHOCARBAMOL 1000 MG/10ML IJ SOLN
500.0000 mg | Freq: Four times a day (QID) | INTRAVENOUS | Status: DC | PRN
  Administered 2016-09-15: 500 mg via INTRAVENOUS
  Filled 2016-09-15: qty 550

## 2016-09-15 MED ORDER — LEVOTHYROXINE SODIUM 112 MCG PO TABS
112.0000 ug | ORAL_TABLET | Freq: Every day | ORAL | Status: DC
  Administered 2016-09-16 – 2016-09-18 (×3): 112 ug via ORAL
  Filled 2016-09-15 (×3): qty 1

## 2016-09-15 MED ORDER — VANCOMYCIN HCL IN DEXTROSE 1-5 GM/200ML-% IV SOLN
INTRAVENOUS | Status: AC
  Administered 2016-09-15: 1000 mg via INTRAVENOUS
  Filled 2016-09-15: qty 200

## 2016-09-15 MED ORDER — PHENOL 1.4 % MT LIQD: 1.0000 | OROMUCOSAL | Status: DC | PRN

## 2016-09-15 MED ORDER — VANCOMYCIN HCL IN DEXTROSE 1-5 GM/200ML-% IV SOLN
1000.0000 mg | Freq: Once | INTRAVENOUS | Status: AC
  Administered 2016-09-15: 1000 mg via INTRAVENOUS

## 2016-09-15 MED ORDER — ONDANSETRON HCL 4 MG/2ML IJ SOLN
INTRAMUSCULAR | Status: DC | PRN
  Administered 2016-09-15: 4 mg via INTRAVENOUS

## 2016-09-15 MED ORDER — PROPOFOL 10 MG/ML IV BOLUS
INTRAVENOUS | Status: AC
  Filled 2016-09-15: qty 40

## 2016-09-15 MED ORDER — VANCOMYCIN HCL IN DEXTROSE 1-5 GM/200ML-% IV SOLN
1000.0000 mg | Freq: Two times a day (BID) | INTRAVENOUS | Status: AC
  Administered 2016-09-15: 22:00:00 1000 mg via INTRAVENOUS
  Filled 2016-09-15: qty 200

## 2016-09-15 MED ORDER — LACTATED RINGERS IV SOLN
INTRAVENOUS | Status: DC
  Administered 2016-09-15: 09:00:00 via INTRAVENOUS

## 2016-09-15 MED ORDER — DEXAMETHASONE SODIUM PHOSPHATE 10 MG/ML IJ SOLN
INTRAMUSCULAR | Status: DC | PRN
  Administered 2016-09-15: 10 mg via INTRAVENOUS

## 2016-09-15 MED ORDER — BUPIVACAINE IN DEXTROSE 0.75-8.25 % IT SOLN
INTRATHECAL | Status: DC | PRN
  Administered 2016-09-15: 2 mL via INTRATHECAL

## 2016-09-15 SURGICAL SUPPLY — 31 items
BLADE SAW SGTL 18X1.27X75 (BLADE) ×2 IMPLANT
CAPT HIP TOTAL 2 ×2 IMPLANT
CELLS DAT CNTRL 66122 CELL SVR (MISCELLANEOUS) ×1 IMPLANT
COVER PERINEAL POST (MISCELLANEOUS) ×2 IMPLANT
COVER SURGICAL LIGHT HANDLE (MISCELLANEOUS) ×2 IMPLANT
DRAPE STERI IOBAN 125X83 (DRAPES) ×2 IMPLANT
DRAPE U-SHAPE 47X51 STRL (DRAPES) ×4 IMPLANT
DRSG AQUACEL AG ADV 3.5X10 (GAUZE/BANDAGES/DRESSINGS) ×2 IMPLANT
DURAPREP 26ML APPLICATOR (WOUND CARE) ×2 IMPLANT
ELECT REM PT RETURN 15FT ADLT (MISCELLANEOUS) ×2 IMPLANT
GAUZE XEROFORM 1X8 LF (GAUZE/BANDAGES/DRESSINGS) ×2 IMPLANT
GLOVE BIO SURGEON STRL SZ7.5 (GLOVE) ×2 IMPLANT
GLOVE BIOGEL PI IND STRL 7.5 (GLOVE) ×4 IMPLANT
GLOVE BIOGEL PI IND STRL 8 (GLOVE) ×2 IMPLANT
GLOVE BIOGEL PI INDICATOR 7.5 (GLOVE) ×4
GLOVE BIOGEL PI INDICATOR 8 (GLOVE) ×2
GLOVE ECLIPSE 8.0 STRL XLNG CF (GLOVE) ×2 IMPLANT
GOWN STRL REUS W/TWL XL LVL3 (GOWN DISPOSABLE) ×8 IMPLANT
HANDPIECE INTERPULSE COAX TIP (DISPOSABLE) ×1
HOLDER FOLEY CATH W/STRAP (MISCELLANEOUS) ×2 IMPLANT
PACK ANTERIOR HIP CUSTOM (KITS) ×2 IMPLANT
RTRCTR WOUND ALEXIS 18CM MED (MISCELLANEOUS) ×2
SET HNDPC FAN SPRY TIP SCT (DISPOSABLE) ×1 IMPLANT
STAPLER VISISTAT 35W (STAPLE) ×2 IMPLANT
SUT ETHIBOND NAB CT1 #1 30IN (SUTURE) ×2 IMPLANT
SUT VIC AB 0 CT1 36 (SUTURE) ×2 IMPLANT
SUT VIC AB 1 CT1 36 (SUTURE) ×2 IMPLANT
SUT VIC AB 2-0 CT1 27 (SUTURE) ×2
SUT VIC AB 2-0 CT1 TAPERPNT 27 (SUTURE) ×2 IMPLANT
TRAY FOLEY W/METER SILVER 16FR (SET/KITS/TRAYS/PACK) ×2 IMPLANT
YANKAUER SUCT BULB TIP 10FT TU (MISCELLANEOUS) ×2 IMPLANT

## 2016-09-15 NOTE — Anesthesia Procedure Notes (Signed)
Date/Time: 09/15/2016 9:36 AM Performed by: Glory Buff Oxygen Delivery Method: Simple face mask

## 2016-09-15 NOTE — Anesthesia Procedure Notes (Signed)
Spinal  Patient location during procedure: OR Start time: 09/15/2016 9:40 AM End time: 09/15/2016 9:44 AM Staffing Anesthesiologist: Suzette Battiest Resident/CRNA: Chrystine Oiler G Performed: resident/CRNA  Preanesthetic Checklist Completed: patient identified, site marked, surgical consent, pre-op evaluation, timeout performed, IV checked, risks and benefits discussed and monitors and equipment checked Spinal Block Patient position: sitting Prep: DuraPrep Patient monitoring: heart rate, continuous pulse ox and blood pressure Approach: midline Location: L2-3 Injection technique: single-shot Needle Needle type: Pencan  Needle gauge: 24 G Needle length: 10 cm Needle insertion depth: 5 cm Assessment Sensory level: T6 Additional Notes Kit expiration date checked and verified, -heme,-paraesthesia, +CSF pre and post injection.  Patient tolerated well.

## 2016-09-15 NOTE — Progress Notes (Signed)
CSW consulted for SNF placement. H&P indicates pt plans to return home at d/c. CSW will review PT recommendations once available and assist with SNF placement if appropriate.  Werner Lean LCSW [phone]

## 2016-09-15 NOTE — Op Note (Signed)
NAMEKEVEN, OSBORN NO.:  [id number]  MEDICAL RECORD NO.:  [id number]  LOCATION:  [facility]                         FACILITY:  [facility]  PHYSICIAN:  Lind Guest. Ninfa Linden, M.D.DATE OF BIRTH:  06/29/1950  DATE OF PROCEDURE:  09/15/2016 DATE OF DISCHARGE:                              OPERATIVE REPORT   PREOPERATIVE DIAGNOSIS:  End-stage avascular necrosis of right hip.  POSTOPERATIVE DIAGNOSES:  End-stage avascular necrosis of right hip.  PROCEDURE:  Right total hip arthroplasty through direct anterior approach.  IMPLANTS:  DePuy Sector Gription acetabular component size 52, size 36 +0 polyethylene liner, size 15 Corail femoral component with standard offset, size 36 +1.5 ceramic hip ball.  SURGEON:  Lind Guest. Ninfa Linden, M.D.  ASSISTANT:  Erskine Emery, PA-C.  ANESTHESIA:  Spinal.  ANTIBIOTICS:  1 g vancomycin.  BLOOD LOSS:  Less than 100 mL.  COMPLICATIONS:  None.  INDICATIONS:  Mr. Wanless is a 66 year old gentleman with end-stage avascular necrosis with post femoral head collapse of his right hip. His pain is daily and it is now detrimentally affected his activities of daily living, his quality of life, his mobility.  This is like an avascular necrosis, it was from years of alcohol abuse in the past.  He is a sickly individual, but at this point, he has worked on his health and well-being in general.  Given the almost femoral head collapse, we recommended a total hip arthroplasty through direct anterior approach. We had a thorough and long discussion of the risks and benefits of surgery including the risk of acute blood loss anemia, nerve and vessel injury, fracture, infection and DVT.  He understands our goals are to decrease pain, improve mobility and overall improved quality of life.  PROCEDURE DESCRIPTION:  After informed consent was obtained, appropriate right hip was marked.  He was brought to the operating room and sat up on a stretcher, so,  spinal anesthesia could be obtained.  Spinal anesthesia was obtained, he was then laid in a supine position on the stretcher.  A Foley catheter was placed and then traction boots were placed on both of his feet.  Next, he was placed supine on the Hana fracture table with the perineal post in place and both legs in inline skeletal traction devices, but no traction applied.  His right operative hip was then prepped and draped with DuraPrep and sterile drapes.  A time-out was called.  He was identified as correct patient and correct right hip.  We then made an incision just inferior and posterior to the anterior superior iliac spine and carried this easily obliquely down the leg.  We dissected down the tensor fascia lata muscle and the tensor fascia was then divided longitudinally to proceed with a direct anterior approach to the hip.  We identified and cauterized the circumflex vessels and then identified the hip capsule.  I opened up the hip capsule, finding a very large joint effusion consistent with avascular necrosis.  We placed Cobra retractors around the medial and lateral femoral neck and then made our femoral neck cut with an oscillating saw and completed this with an osteotome.  We placed a corkscrew guide in the femoral head and removed  the femoral head in its entirety and found a wide area of femoral head collapse with avascular necrosis.  We then cleaned the remnants of the acetabular labrum from the acetabulum.  We placed a bent Hohmann over the medial acetabular rim and then began reaming under direct visualization from a size 43 reamer up to a size 52 with all reamers under direct visualization, the last reamer under direct fluoroscopy, so we could obtain our depth of reaming, our inclination and anteversion.  Once we were pleased with this, we placed the real DePuy Sector Gription acetabular component size 52 and a 36 +0 polyethylene liner for that size acetabular component.   Attention was then turned to the femur.  With the leg externally rotated to 120 degrees, extended and adducted, we were able to place a Mueller retractor medially and a Hohmann retractor behind the greater trochanter.  We released the lateral joint capsule and used a box cutting osteotome to enter the femoral canal and a rongeur to lateralize.  We then began broaching using the Corail broaching system from DePuy going from a size 8 all the way up to a size 15.  With a size 15 in place, we trialed a standard offset femoral neck and a 36+ 1.5 trial hip ball.  We brought the leg back over and up with traction and internal rotation reducing the pelvis, and we were pleased with leg length, offset, range of motion and stability.  We then dislocated the hip and removed the trial components.  We were able to place the real Corail femoral component size 15 with standard offset and real 36 +1.5 ceramic hip ball and again reduced this in the pelvis and the acetabulum, and we were pleased with stability.  We then irrigated the soft tissue with normal saline solution using pulsatile lavage.  We were able to easily close the joint capsule with interrupted #1 Ethibond suture followed by running #1 Vicryl in the tensor fascia, 0 Vicryl in the deep tissue, 2-0 Vicryl in the subcutaneous tissue, interrupted staples on the skin.  Xeroform and Aquacel dressing were applied.  He was taken off the Hana table and taken to the recovery room in stable condition.  All final counts were correct.  There were no complications noted.  Of note, Erskine Emery, PA-C assisted in the entire case.  His assistance was crucial for facilitating all aspects of this case.     Lind Guest. Ninfa Linden, M.D.     CYB/MEDQ  D:  09/15/2016  T:  09/15/2016  Job:  [id number]

## 2016-09-15 NOTE — Evaluation (Signed)
Physical Therapy Evaluation Patient Details Name: [name] MRN: [id number] DOB: 05-20-1950 Today's Date: 09/15/2016   History of Present Illness  Pt s/p R THR and with hx of laryngectomy, tracheostomy, CHF and ETOH abuse  Clinical Impression  Pt s/p R THR and presents with decreased R LE strength/ROM and post op pain limiting functional mobility.  Pt should progress to dc home with family assist.    Follow Up Recommendations Home health PT    Equipment Recommendations  None recommended by PT    Recommendations for Other Services       Precautions / Restrictions Precautions Precautions: Fall Restrictions Weight Bearing Restrictions: No Other Position/Activity Restrictions: WBAT      Mobility  Bed Mobility Overal bed mobility: Needs Assistance Bed Mobility: Supine to Sit;Sit to Supine     Supine to sit: Min assist Sit to supine: Min assist   General bed mobility comments: Cues for sequence and use of L LE to self assist  Transfers                 General transfer comment: NT - pt with c/o dizziness in sitting - BP 70/52   Ambulation/Gait                Stairs            Wheelchair Mobility    Modified Rankin (Stroke Patients Only)       Balance                                             Pertinent Vitals/Pain Pain Assessment: Faces Faces Pain Scale: Hurts little more Pain Location: R hip Pain Descriptors / Indicators: Sore Pain Intervention(s): Limited activity within patient's tolerance;Monitored during session;Premedicated before session    Home Living Family/patient expects to be discharged to:: Private residence Living Arrangements: Children Available Help at Discharge: Family Type of Home: House Home Access: Stairs to enter Entrance Stairs-Rails: Right Entrance Stairs-Number of Steps: 2 Home Layout: One level Home Equipment: Environmental consultant - 4 wheels      Prior Function Level of Independence:  Independent               Hand Dominance   Dominant Hand: Right    Extremity/Trunk Assessment   Upper Extremity Assessment Upper Extremity Assessment: Overall WFL for tasks assessed    Lower Extremity Assessment Lower Extremity Assessment: RLE deficits/detail       Communication   Communication: Tracheostomy;Other (comment) (Laryngectomy - pt to bring voice box in)  Cognition Arousal/Alertness: Awake/alert Behavior During Therapy: WFL for tasks assessed/performed Overall Cognitive Status: Within Functional Limits for tasks assessed                                        General Comments      Exercises     Assessment/Plan    PT Assessment Patient needs continued PT services  PT Problem List Decreased strength;Decreased range of motion;Decreased activity tolerance;Decreased mobility;Decreased knowledge of use of DME;Pain       PT Treatment Interventions DME instruction;Gait training;Stair training;Functional mobility training;Therapeutic activities;Therapeutic exercise;Patient/family education    PT Goals (Current goals can be found in the Care Plan section)  Acute Rehab PT Goals Patient Stated Goal: Regain IND PT Goal Formulation: With patient Time  For Goal Achievement: 09/20/16 Potential to Achieve Goals: Good    Frequency 7X/week   Barriers to discharge        Co-evaluation               AM-PAC PT "6 Clicks" Daily Activity  Outcome Measure Difficulty turning over in bed (including adjusting bedclothes, sheets and blankets)?: Total Difficulty moving from lying on back to sitting on the side of the bed? : Total Difficulty sitting down on and standing up from a chair with arms (e.g., wheelchair, bedside commode, etc,.)?: Total Help needed moving to and from a bed to chair (including a wheelchair)?: A Lot Help needed walking in hospital room?: A Lot Help needed climbing 3-5 steps with a railing? : A Lot 6 Click Score: 9     End of Session Equipment Utilized During Treatment: Gait belt Activity Tolerance: Other (comment) (orthostatic) Patient left: in bed;with call bell/phone within reach Nurse Communication: Mobility status PT Visit Diagnosis: Difficulty in walking, not elsewhere classified (R26.2)    Time: 1528-1600 PT Time Calculation (min) (ACUTE ONLY): 32 min   Charges:   PT Evaluation $PT Eval Low Complexity: 1 Procedure PT Treatments $Therapeutic Activity: 8-22 mins   PT G Codes:        Pg [phone]   Joani Cosma 09/15/2016, 4:18 PM

## 2016-09-15 NOTE — Anesthesia Postprocedure Evaluation (Signed)
Anesthesia Post Note  Patient: [name]  Procedure(s) Performed: Procedure(s) (LRB): RIGHT TOTAL HIP ARTHROPLASTY ANTERIOR APPROACH (Right)     Patient location during evaluation: PACU Anesthesia Type: Spinal Level of consciousness: awake and alert Pain management: pain level controlled Vital Signs Assessment: post-procedure vital signs reviewed and stable Respiratory status: spontaneous breathing and respiratory function stable Cardiovascular status: blood pressure returned to baseline and stable Postop Assessment: spinal receding Anesthetic complications: no    Last Vitals:  Vitals:   09/15/16 1300 09/15/16 1400  BP: 122/77 123/80  Pulse: 69 61  Resp: 18 18  Temp: 36.6 C     Last Pain:  Vitals:   09/15/16 1300  TempSrc: Temporal  PainSc: 0-No pain                 Tiajuana Amass

## 2016-09-15 NOTE — Transfer of Care (Signed)
Immediate Anesthesia Transfer of Care Note  Patient: [name]  Procedure(s) Performed: Procedure(s): RIGHT TOTAL HIP ARTHROPLASTY ANTERIOR APPROACH (Right)  Patient Location: PACU  Anesthesia Type:Spinal  Level of Consciousness: awake, alert  and oriented  Airway & Oxygen Therapy: Patient Spontanous Breathing and Patient connected to tracheostomy mask oxygen  Post-op Assessment: Report given to RN and Post -op Vital signs reviewed and stable  Post vital signs: Reviewed and stable  Last Vitals:  Vitals:   09/15/16 0745  BP: (!) 167/95  Resp: 18  Temp: 36.7 C    Last Pain:  Vitals:   09/15/16 0745  TempSrc: Oral      Patients Stated Pain Goal: 4 (12/07/47 6116)  Complications: No apparent anesthesia complications

## 2016-09-15 NOTE — Brief Op Note (Signed)
09/15/2016  10:49 AM  PATIENT:  [name]  66 y.o. male  PRE-OPERATIVE DIAGNOSIS:  avascular necrosis right hip  POST-OPERATIVE DIAGNOSIS:  avascular necrosis right hip  PROCEDURE:  Procedure(s): RIGHT TOTAL HIP ARTHROPLASTY ANTERIOR APPROACH (Right)  SURGEON:  Surgeon(s) and Role:    Mcarthur Rossetti, MD - Primary  PHYSICIAN ASSISTANT: Benita Stabile, PA-C  ANESTHESIA:   spinal  EBL:  Total I/O In: -  Out: 250 [Blood:250]  COUNTS:  YES  DICTATION: .Other Dictation: Dictation Number [id number]  PLAN OF CARE: Admit to inpatient   PATIENT DISPOSITION:  PACU - hemodynamically stable.   Delay start of Pharmacological VTE agent (>24hrs) due to surgical blood loss or risk of bleeding: no

## 2016-09-16 LAB — CBC
HEMATOCRIT: 33.7 % — AB (ref 39.0–52.0)
HEMOGLOBIN: 11.4 g/dL — AB (ref 13.0–17.0)
MCH: 30.2 pg (ref 26.0–34.0)
MCHC: 33.8 g/dL (ref 30.0–36.0)
MCV: 89.4 fL (ref 78.0–100.0)
Platelets: 224 10*3/uL (ref 150–400)
RBC: 3.77 MIL/uL — ABNORMAL LOW (ref 4.22–5.81)
RDW: 14.6 % (ref 11.5–15.5)
WBC: 9 10*3/uL (ref 4.0–10.5)

## 2016-09-16 LAB — BASIC METABOLIC PANEL
Anion gap: 9 (ref 5–15)
BUN: 12 mg/dL (ref 6–20)
CALCIUM: 8.5 mg/dL — AB (ref 8.9–10.3)
CHLORIDE: 100 mmol/L — AB (ref 101–111)
CO2: 24 mmol/L (ref 22–32)
CREATININE: 1 mg/dL (ref 0.61–1.24)
GFR calc non Af Amer: >60 mL/min (ref >60)
GLUCOSE: 141 mg/dL — AB (ref 65–99)
Potassium: 4.3 mmol/L (ref 3.5–5.1)
Sodium: 133 mmol/L — ABNORMAL LOW (ref 135–145)

## 2016-09-16 NOTE — Care Management Note (Signed)
66 yo M s/p R THR. Received referral to assist with [place]. PT is recommending HHPT. Met with pt. He plans to return home with the support of his daughter. Discussed preference for a [facility] agency. Referral made to [facility] by the surgeon's office. He agreed to use [facility]. Confirmed referral with Butch Penny at [facility].

## 2016-09-16 NOTE — Progress Notes (Signed)
Physical Therapy Treatment Patient Details Name: [name] MRN: [id number] DOB: 1951-01-01 Today's Date: 09/16/2016    History of Present Illness Pt s/p R THR and with hx of laryngectomy, tracheostomy, CHF and ETOH abuse    PT Comments    Marked improvement in activity tolerance and with no c/o dizziness.   Follow Up Recommendations  Home health PT     Equipment Recommendations  None recommended by PT    Recommendations for Other Services OT consult     Precautions / Restrictions Precautions Precautions: Fall Restrictions Weight Bearing Restrictions: No Other Position/Activity Restrictions: WBAT    Mobility  Bed Mobility Overal bed mobility: Needs Assistance Bed Mobility: Supine to Sit     Supine to sit: Min assist     General bed mobility comments: Cues for sequence and use of L LE to self assist  Transfers Overall transfer level: Needs assistance   Transfers: Sit to/from Stand Sit to Stand: Min assist;+2 safety/equipment         General transfer comment: cues for LE management and use of UEs to self assist  Ambulation/Gait Ambulation/Gait assistance: Min assist Ambulation Distance (Feet): 75 Feet Assistive device: Rolling walker (2 wheeled) Gait Pattern/deviations: Step-to pattern;Decreased step length - right;Decreased step length - left;Shuffle;Trunk flexed Gait velocity: decr Gait velocity interpretation: Below normal speed for age/gender General Gait Details: cues for sequence, posture and position from [employer]            Wheelchair Mobility    Modified Rankin (Stroke Patients Only)       Balance Overall balance assessment: No apparent balance deficits (not formally assessed)                                          Cognition Arousal/Alertness: Awake/alert Behavior During Therapy: WFL for tasks assessed/performed Overall Cognitive Status: Within Functional Limits for tasks assessed                                         Exercises Total Joint Exercises Ankle Circles/Pumps: AROM;Both;20 reps;Supine Quad Sets: AROM;Both;10 reps;Supine Heel Slides: AAROM;Right;20 reps;Supine Hip ABduction/ADduction: AAROM;Right;15 reps;Supine    General Comments        Pertinent Vitals/Pain Pain Assessment: 0-10 Pain Score: 5  Pain Location: R hip Pain Descriptors / Indicators: Sore Pain Intervention(s): Limited activity within patient's tolerance;Monitored during session;Premedicated before session;Ice applied    Home Living                      Prior Function            PT Goals (current goals can now be found in the care plan section) Acute Rehab PT Goals Patient Stated Goal: Regain IND PT Goal Formulation: With patient Time For Goal Achievement: 09/20/16 Potential to Achieve Goals: Good Progress towards PT goals: Progressing toward goals    Frequency    7X/week      PT Plan Current plan remains appropriate    Co-evaluation              AM-PAC PT "6 Clicks" Daily Activity  Outcome Measure  Difficulty turning over in bed (including adjusting bedclothes, sheets and blankets)?: Total Difficulty moving from lying on back to sitting on the side of the bed? :  Total Difficulty sitting down on and standing up from a chair with arms (e.g., wheelchair, bedside commode, etc,.)?: Total Help needed moving to and from a bed to chair (including a wheelchair)?: A Little Help needed walking in hospital room?: A Little Help needed climbing 3-5 steps with a railing? : A Lot 6 Click Score: 11    End of Session Equipment Utilized During Treatment: Gait belt Activity Tolerance: Patient tolerated treatment well Patient left: in chair;with call bell/phone within reach Nurse Communication: Mobility status PT Visit Diagnosis: Difficulty in walking, not elsewhere classified (R26.2)     Time: 5189-8421 PT Time Calculation (min) (ACUTE ONLY): 38 min  Charges:   $Gait Training: 8-22 mins $Therapeutic Exercise: 8-22 mins $Therapeutic Activity: 8-22 mins                    G Codes:       Pg [phone]    Tangelia Sanson 09/16/2016, 12:36 PM

## 2016-09-16 NOTE — Progress Notes (Signed)
Subjective: 1 Day Post-Op Procedure(s) (LRB): RIGHT TOTAL HIP ARTHROPLASTY ANTERIOR APPROACH (Right) Patient reports pain as moderate.  Tolerated surgery well.  Vitals and labs stable.  Objective: Vital signs in last 24 hours: Temp:  [97 F (36.1 C)-98.1 F (36.7 C)] 98 F (36.7 C) (07/14 0916) Pulse Rate:  [49-78] 72 (07/14 0916) Resp:  [13-19] 15 (07/14 0916) BP: (70-221)/(52-95) 118/80 (07/14 0916) SpO2:  [97 %-100 %] 98 % (07/14 0916) FiO2 (%):  [28 %] 28 % (07/14 0308) Weight:  [124 lb (56.2 kg)] 124 lb (56.2 kg) (07/13 1300)  Intake/Output from previous day: 07/13 0701 - 07/14 0700 In: 3103.8 [P.O.:640; I.V.:2153.8; IV Piggyback:310] Out: 3100 [Urine:2850; Blood:250] Intake/Output this shift: Total I/O In: 240 [P.O.:240] Out: -    Recent Labs  09/16/16 0538  HGB 11.4*    Recent Labs  09/16/16 0538  WBC 9.0  RBC 3.77*  HCT 33.7*  PLT 224    Recent Labs  09/16/16 0538  NA 133*  K 4.3  CL 100*  CO2 24  BUN 12  CREATININE 1.00  GLUCOSE 141*  CALCIUM 8.5*   No results for input(s): LABPT, INR in the last 72 hours.  Sensation intact distally Intact pulses distally Dorsiflexion/Plantar flexion intact Incision: scant drainage  Assessment/Plan: 1 Day Post-Op Procedure(s) (LRB): RIGHT TOTAL HIP ARTHROPLASTY ANTERIOR APPROACH (Right) Up with therapy Discharge home with home health next 1-2 days if progress well with therapy.  Mcarthur Rossetti 09/16/2016, 10:45 AM

## 2016-09-16 NOTE — Progress Notes (Signed)
Physical Therapy Treatment Patient Details Name: [name] MRN: [id number] DOB: 1950/03/11 Today's Date: 09/16/2016    History of Present Illness Pt s/p R THR and with hx of laryngectomy, tracheostomy, CHF and ETOH abuse    PT Comments    Pt very motivated and progressing well with mobility.   Follow Up Recommendations  Home health PT     Equipment Recommendations  None recommended by PT    Recommendations for Other Services OT consult     Precautions / Restrictions Precautions Precautions: Fall Restrictions Weight Bearing Restrictions: No Other Position/Activity Restrictions: WBAT    Mobility  Bed Mobility               General bed mobility comments: Pt OOB and requests back to chair  Transfers Overall transfer level: Needs assistance Equipment used: Rolling walker (2 wheeled) Transfers: Sit to/from Stand Sit to Stand: Min guard         General transfer comment: cues for LE management and use of UEs to self assist  Ambulation/Gait Ambulation/Gait assistance: Min guard Ambulation Distance (Feet): 200 Feet Assistive device: Rolling walker (2 wheeled) Gait Pattern/deviations: Step-to pattern;Decreased step length - right;Decreased step length - left;Shuffle;Trunk flexed Gait velocity: decr Gait velocity interpretation: Below normal speed for age/gender General Gait Details: cues for sequence, posture and position from [employer]            Wheelchair Mobility    Modified Rankin (Stroke Patients Only)       Balance Overall balance assessment: No apparent balance deficits (not formally assessed)                                          Cognition Arousal/Alertness: Awake/alert Behavior During Therapy: WFL for tasks assessed/performed Overall Cognitive Status: Within Functional Limits for tasks assessed                                        Exercises      General Comments        Pertinent  Vitals/Pain Pain Assessment: 0-10 Pain Score: 4  Pain Location: R hip Pain Descriptors / Indicators: Sore Pain Intervention(s): Limited activity within patient's tolerance;Monitored during session;Premedicated before session    Home Living                      Prior Function            PT Goals (current goals can now be found in the care plan section) Acute Rehab PT Goals Patient Stated Goal: Regain IND PT Goal Formulation: With patient Time For Goal Achievement: 09/20/16 Potential to Achieve Goals: Good Progress towards PT goals: Progressing toward goals    Frequency    7X/week      PT Plan Current plan remains appropriate    Co-evaluation              AM-PAC PT "6 Clicks" Daily Activity  Outcome Measure  Difficulty turning over in bed (including adjusting bedclothes, sheets and blankets)?: Total Difficulty moving from lying on back to sitting on the side of the bed? : Total Difficulty sitting down on and standing up from a chair with arms (e.g., wheelchair, bedside commode, etc,.)?: Total Help needed moving to and from a bed to chair (including  a wheelchair)?: A Little Help needed walking in hospital room?: A Little Help needed climbing 3-5 steps with a railing? : A Little 6 Click Score: 12    End of Session Equipment Utilized During Treatment: Gait belt Activity Tolerance: Patient tolerated treatment well Patient left: in chair;with call bell/phone within reach;with family/visitor present Nurse Communication: Mobility status PT Visit Diagnosis: Difficulty in walking, not elsewhere classified (R26.2)     Time: 1450-1507 PT Time Calculation (min) (ACUTE ONLY): 17 min  Charges:  $Gait Training: 8-22 mins                    G Codes:       Pg [phone]    [name] 09/16/2016, 4:12 PM

## 2016-09-17 MED ORDER — OXYCODONE HCL 5 MG PO TABS: 5.0000 mg | ORAL_TABLET | ORAL | 0 refills | Status: DC | PRN

## 2016-09-17 MED ORDER — METHOCARBAMOL 500 MG PO TABS: 500.0000 mg | ORAL_TABLET | Freq: Four times a day (QID) | ORAL | 0 refills | Status: DC | PRN

## 2016-09-17 MED ORDER — ASPIRIN EC 81 MG PO TBEC: 81.0000 mg | DELAYED_RELEASE_TABLET | Freq: Two times a day (BID) | ORAL | 0 refills | Status: DC

## 2016-09-17 NOTE — Discharge Instructions (Signed)

## 2016-09-17 NOTE — Addendum Note (Signed)
Addendum  created 09/17/16 2004 by Lollie Sails, CRNA   Anesthesia Intra Meds edited

## 2016-09-17 NOTE — Progress Notes (Signed)
Subjective: 2 Days Post-Op Procedure(s) (LRB): RIGHT TOTAL HIP ARTHROPLASTY ANTERIOR APPROACH (Right) Patient reports pain as moderate.  Making progress with therapy.  Objective: Vital signs in last 24 hours: Temp:  [98 F (36.7 C)-98.9 F (37.2 C)] 98.9 F (37.2 C) (07/15 0500) Pulse Rate:  [70-97] 77 (07/15 0805) Resp:  [14-18] 16 (07/15 0805) BP: (117-161)/(76-83) 118/78 (07/15 0500) SpO2:  [94 %-98 %] 94 % (07/15 0805)  Intake/Output from previous day: 07/14 0701 - 07/15 0700 In: 1080 [P.O.:1080] Out: 1575 [Urine:1575] Intake/Output this shift: No intake/output data recorded.   Recent Labs  09/16/16 0538  HGB 11.4*    Recent Labs  09/16/16 0538  WBC 9.0  RBC 3.77*  HCT 33.7*  PLT 224    Recent Labs  09/16/16 0538  NA 133*  K 4.3  CL 100*  CO2 24  BUN 12  CREATININE 1.00  GLUCOSE 141*  CALCIUM 8.5*   No results for input(s): LABPT, INR in the last 72 hours.  Sensation intact distally Intact pulses distally Dorsiflexion/Plantar flexion intact Incision: dressing C/D/I  Assessment/Plan: 2 Days Post-Op Procedure(s) (LRB): RIGHT TOTAL HIP ARTHROPLASTY ANTERIOR APPROACH (Right) Up with therapy Plan for discharge tomorrow Discharge home with home health  Mcarthur Rossetti 09/17/2016, 8:44 AM

## 2016-09-17 NOTE — Care Management Note (Signed)
Case Management Note  Patient Details  Name: ERCELL RAZON MRN: [id number] Date of Birth: Dec 31, 1950  Subjective/Objective:                    Action/Plan:CM will sign off for now but will be available should additional discharge needs arise or disposition change.    Expected Discharge Date:                  Expected Discharge Plan:  [place]  In-House Referral:     Discharge planning Services  CM Consult  Post Acute Care Choice:  Home Health Choice offered to:  Patient  DME Arranged:  N/A DME Agency:     HH Arranged:  PT [place]:  [facility] (formerly [employer])  Status of Service:  Completed, signed off  If discussed at [employer], dates discussed:    Additional Comments:  Delrae Sawyers, RN 09/17/2016, 12:15 PM

## 2016-09-17 NOTE — Progress Notes (Signed)
Physical Therapy Treatment Patient Details Name: [name] MRN: [id number] DOB: 1950-08-06 Today's Date: 09/17/2016    History of Present Illness Pt s/p R THR and with hx of laryngectomy, tracheostomy, CHF and ETOH abuse    PT Comments    Pt continues very motivated and progressing well with mobility and eager for dc home tomorrow.   Follow Up Recommendations  Home health PT     Equipment Recommendations  None recommended by PT    Recommendations for Other Services OT consult     Precautions / Restrictions Precautions Precautions: Fall Restrictions Weight Bearing Restrictions: No Other Position/Activity Restrictions: WBAT    Mobility  Bed Mobility                  Transfers Overall transfer level: Needs assistance Equipment used: Rolling walker (2 wheeled) Transfers: Sit to/from Stand Sit to Stand: Min guard         General transfer comment: min cues for LE management and use of UEs to self assist  Ambulation/Gait Ambulation/Gait assistance: Min guard;Supervision Ambulation Distance (Feet): 260 Feet (and 15' into bathroom) Assistive device: Rolling walker (2 wheeled) Gait Pattern/deviations: Step-to pattern;Step-through pattern;Decreased step length - right;Decreased step length - left;Shuffle;Trunk flexed Gait velocity: decr Gait velocity interpretation: Below normal speed for age/gender General Gait Details: min cues for sequence, posture and position from RW   Stairs Stairs: Yes   Stair Management: Step to pattern;Forwards;One rail Right;With walker Number of Stairs: 4 General stair comments: 2 stairs twice with cues for sequence, R rail and L hand on RW at top of two stes  Wheelchair Mobility    Modified Rankin (Stroke Patients Only)       Balance Overall balance assessment: No apparent balance deficits (not formally assessed)                                          Cognition Arousal/Alertness:  Awake/alert Behavior During Therapy: WFL for tasks assessed/performed Overall Cognitive Status: Within Functional Limits for tasks assessed                                        Exercises      General Comments        Pertinent Vitals/Pain Pain Assessment: 0-10 Pain Score: 4  Pain Location: R hip Pain Descriptors / Indicators: Sore Pain Intervention(s): Limited activity within patient's tolerance;Monitored during session;Premedicated before session;Ice applied    Home Living                      Prior Function            PT Goals (current goals can now be found in the care plan section) Acute Rehab PT Goals Patient Stated Goal: Regain IND PT Goal Formulation: With patient Time For Goal Achievement: 09/20/16 Potential to Achieve Goals: Good Progress towards PT goals: Progressing toward goals    Frequency    7X/week      PT Plan Current plan remains appropriate    Co-evaluation              AM-PAC PT "6 Clicks" Daily Activity  Outcome Measure  Difficulty turning over in bed (including adjusting bedclothes, sheets and blankets)?: A Little Difficulty moving from lying on back to sitting on the  side of the bed? : A Little Difficulty sitting down on and standing up from a chair with arms (e.g., wheelchair, bedside commode, etc,.)?: A Little Help needed moving to and from a bed to chair (including a wheelchair)?: A Little Help needed walking in hospital room?: A Little Help needed climbing 3-5 steps with a railing? : A Little 6 Click Score: 18    End of Session Equipment Utilized During Treatment: Gait belt Activity Tolerance: Patient tolerated treatment well Patient left: in chair;with call bell/phone within reach;with family/visitor present Nurse Communication: Mobility status PT Visit Diagnosis: Difficulty in walking, not elsewhere classified (R26.2)     Time: 9191-6606 PT Time Calculation (min) (ACUTE ONLY): 29  min  Charges:  $Gait Training: 8-22 mins $Therapeutic Activity: 8-22 mins                    G Codes:       Pg [phone]    Nimesh Riolo 09/17/2016, 4:10 PM

## 2016-09-18 NOTE — Discharge Summary (Signed)
[name] MRN: [id number] DOB/AGE: January 10, 1951 66 y.o.  Admit date: 09/15/2016 Discharge date: 09/18/2016  Admission Diagnoses:  Principal Problem:   Avascular necrosis of hip, right ([place]) Active Problems:   Status post total replacement of right hip   Discharge Diagnoses:  Same  Past Medical History:  Diagnosis Date  . Alcohol abuse   . Arthritis   . CHF (congestive heart failure) ([place])    EF 45-50% 05/2012  . Cirrhosis of liver ([place])   . Complication of anesthesia    SUCCINYLCHOLINE ADVERSE REACTION (not an allergy) Following 1st attempt to place tracheotomy tube in 2011 at [facility]; patient went into "succinylcholine induced pulseless electrical activity secondary to probable hyperkalemia" and underwent chest compressions and placement of endotracheal tube with admission to medical ICU.  EXTREMELY HIGH SEVERE REACTION.   Marland Kitchen Hyperlipidemia   . Hypertension   . Hypothyroidism   . Laryngeal cancer ([place]) 2011   Laryngectomy, T3N0M0  . Seizures ([place])     Surgeries: Procedure(s): RIGHT TOTAL HIP ARTHROPLASTY ANTERIOR APPROACH on 09/15/2016   Consultants:   Discharged Condition: Improved  Hospital Course: TAVEN STRITE is an 66 y.o. male who was admitted 09/15/2016 for operative treatment ofAvascular necrosis of hip, right ([place]). Patient has severe unremitting pain that affects sleep, daily activities, and work/hobbies. After pre-op clearance the patient was taken to the operating room on 09/15/2016 and underwent  Procedure(s): RIGHT TOTAL HIP ARTHROPLASTY ANTERIOR APPROACH.    Patient was given perioperative antibiotics: Anti-infectives    Start     Dose/Rate Route Frequency Ordered Stop   09/15/16 2200  vancomycin (VANCOCIN) IVPB 1000 mg/200 mL premix     1,000 mg 200 mL/hr over 60 Minutes Intravenous Every 12 hours 09/15/16 1308 09/15/16 2240   09/15/16 0930  vancomycin (VANCOCIN) IVPB 1000 mg/200 mL premix     1,000 mg 200 mL/hr over 60  Minutes Intravenous  Once 09/15/16 0923 09/15/16 1020   09/15/16 0754  ceFAZolin (ANCEF) IVPB 2g/100 mL premix  Status:  Discontinued     2 g 200 mL/hr over 30 Minutes Intravenous On call to O.R. 09/15/16 8341 09/15/16 0925       Patient was given sequential compression devices, early ambulation, and chemoprophylaxis to prevent DVT.  Patient benefited maximally from hospital stay and there were no complications.    Recent vital signs: Patient Vitals for the past 24 hrs:  BP Temp Temp src Pulse Resp SpO2  09/18/16 0500 118/75 98.4 F (36.9 C) Oral 85 16 98 %  09/17/16 2100 (!) 154/86 98.6 F (37 C) Oral 80 16 93 %  09/17/16 2029 - - - 79 16 96 %  09/17/16 1308 116/72 98 F (36.7 C) Oral 75 15 97 %  09/17/16 0805 - - - 77 16 94 %     Recent laboratory studies:  Recent Labs  09/16/16 0538  WBC 9.0  HGB 11.4*  HCT 33.7*  PLT 224  NA 133*  K 4.3  CL 100*  CO2 24  BUN 12  CREATININE 1.00  GLUCOSE 141*  CALCIUM 8.5*     Discharge Medications:   Allergies as of 09/18/2016      Reactions   Succinylcholine Other (See Comments)   ADVERSE REACTION (not an allergy) Following 1st attempt to place tracheotomy tube in 2011 at [facility]; patient went into "succinylcholine induced pulseless electrical activity secondary to probable hyperkalemia" and underwent chest compressions and placement of endotracheal tube with admission to medical ICU.  EXTREMELY HIGH SEVERE REACTION.    Tylenol [acetaminophen] Other (See Comments), Hypertension   HBP -Liver Cirrhosis   Vicodin [hydrocodone-acetaminophen] Rash   Other    Unknown anesthesia medicine caused cardiac arrest.      Medication List    STOP taking these medications   traMADol 50 MG tablet Commonly known as:  ULTRAM     TAKE these medications   amLODipine 5 MG tablet Commonly known as:  NORVASC TAKE 1 TABLET(5 MG) BY MOUTH DAILY   aspirin EC 81 MG tablet Take 1 tablet (81 mg total) by mouth 2 (two) times daily  after a meal. What changed:  when to take this   bisoprolol 10 MG tablet Commonly known as:  ZEBETA TAKE 1 TABLET(10 MG) BY MOUTH DAILY   diclofenac sodium 1 % Gel Commonly known as:  VOLTAREN Apply 2 g topically 3 (three) times daily as needed (hip pain/back pain).   feeding supplement (JEVITY 1.5 CAL) Liqd Place 70 mL/hr into feeding tube continuous.   guaiFENesin 600 MG 12 hr tablet Commonly known as:  MUCINEX Take 1 tablet (600 mg total) by mouth 2 (two) times daily.   levothyroxine 112 MCG tablet Commonly known as:  SYNTHROID, LEVOTHROID TAKE 1 TABLET(112 MCG) BY MOUTH DAILY BEFORE BREAKFAST   magic mouthwash w/lidocaine Soln Take 5 mLs by mouth 4 (four) times daily.   Menthol (Topical Analgesic) 4 % Gel Commonly known as:  BIOFREEZE Apply to affected area.   methocarbamol 500 MG tablet Commonly known as:  ROBAXIN Take 1 tablet (500 mg total) by mouth every 6 (six) hours as needed for muscle spasms.   oxyCODONE 5 MG immediate release tablet Commonly known as:  Oxy IR/ROXICODONE Take 1-2 tablets (5-10 mg total) by mouth every 4 (four) hours as needed for breakthrough pain.   Potassium Chloride ER 20 MEQ Tbcr TAKE 1 TABLET BY MOUTH DAILY   potassium chloride SA 20 MEQ tablet Commonly known as:  K-DUR,KLOR-CON Take 2 tablets (40 mEq total) by mouth 2 (two) times daily.            Durable Medical Equipment        Start     Ordered   09/15/16 1309  DME Walker rolling  Once    Question:  Patient needs a walker to treat with the following condition  Answer:  Status post total replacement of right hip   09/15/16 1308   09/15/16 1309  DME 3 n 1  Once     09/15/16 1308      Diagnostic Studies: Dg Pelvis Portable  Result Date: 09/15/2016 CLINICAL DATA:  Postop right total hip replacement. EXAM: PORTABLE PELVIS 1-2 VIEWS COMPARISON:  09/15/2016, 07/27/2016 FINDINGS: Status post right total hip arthroplasty. No evidence for dislocation on the frontal views  performed. The femoral head component appears well positioned within the acetabular component. Degenerative changes are noted in the left hip. Postoperative changes are identified in the soft tissues around the right hip. IMPRESSION: Status post right total hip arthroplasty. No adverse features identified. Electronically Signed   By: Nolon Nations M.D.   On: 09/15/2016 12:01   Dg C-arm 1-60 Min-no Report  Result Date: 09/15/2016 Fluoroscopy was utilized by the requesting physician.  No radiographic interpretation.   Dg Hip Operative Unilat W Or W/o Pelvis Right  Result Date: 09/15/2016 CLINICAL DATA:  Rt total hip replacement    .3 min fluoro EXAM: OPERATIVE RIGHT HIP (WITH PELVIS IF PERFORMED) 2 VIEWS TECHNIQUE: Fluoroscopic spot image(s)  were submitted for interpretation post-operatively. COMPARISON:  07/27/2016 FINDINGS: Fluoroscopic spot images document right hip arthroplasty hardware projecting in expected location. Negative for fracture or dislocation. IMPRESSION: Interval right hip arthroplasty. Electronically Signed   By: Lucrezia Europe M.D.   On: 09/15/2016 10:59    Disposition: 01-Home or Self Care  Discharge Instructions    Discharge patient    Complete by:  As directed    Discharge disposition:  01-Home or Self Care   Discharge patient date:  09/18/2016      Follow-up Information    Mcarthur Rossetti, MD Follow up in 2 week(s).   Specialty:  Orthopedic Surgery Contact information: [place] [address] [phone]        [facility] Follow up.   Specialty:  Home Health Services Why:  HHPT has been ordered by your MD and will be provided by the above agency. A representative will be in touch within 24-48 hours of discharge to arrange initial visit.   Contact information: [address] [place] [address] [phone]            Signed: Mcarthur Rossetti 09/18/2016, 7:07 AM

## 2016-09-18 NOTE — Progress Notes (Signed)
[name], male   DOB: 1951/01/20, 66 y.o.   MRN: [id number] Doing well overall.  Right hip stable.  Vitals stable.  Can be discharged to home today.

## 2016-09-18 NOTE — Progress Notes (Signed)
Physical Therapy Treatment Patient Details Name: [name] MRN: [id number] DOB: 1950-06-13 Today's Date: 09/18/2016    History of Present Illness Pt s/p R THR and with hx of laryngectomy, tracheostomy, CHF and ETOH abuse    PT Comments    POD # 3 Assisted OOB to amb to bathroom, amb in hallway, practice stairs then perfrom THR TE's following HEP handout.  Instructed on proper tech, freq as well as use of ICE. All mobility concerns addressed.  Pt ready for D/C back home with daughter.  Follow Up Recommendations  Home health PT     Equipment Recommendations  None recommended by PT (has a 9JJ)    Recommendations for Other Services       Precautions / Restrictions Precautions Precautions: Fall Restrictions Weight Bearing Restrictions: No Other Position/Activity Restrictions: WBAT    Mobility  Bed Mobility Overal bed mobility: Modified Independent Bed Mobility: Supine to Sit           General bed mobility comments: increased time  Transfers Overall transfer level: Needs assistance Equipment used: None;Rolling walker (2 wheeled) Transfers: Sit to/from [facility] Sit to Stand: Supervision;Min guard Stand pivot transfers: Supervision;Min guard       General transfer comment: good use of hands to steady self and control stand to sit  Ambulation/Gait Ambulation/Gait assistance: Supervision Ambulation Distance (Feet): 125 Feet Assistive device: Rolling walker (2 wheeled) Gait Pattern/deviations: Step-to pattern;Step-through pattern;Decreased step length - right;Decreased step length - left;Shuffle;Trunk flexed     General Gait Details: one VC safety with turns in tight spaces (bathroom)    Stairs Stairs: Yes   Stair Management: Two rails;Forwards Number of Stairs: 2 General stair comments: 2 stairs twice with cues for sequence, R rail and L hand on RW at top of two stes and one VC on proper sequencing going down  Wheelchair Mobility     Modified Rankin (Stroke Patients Only)       Balance                                            Cognition Arousal/Alertness: Awake/alert Behavior During Therapy: WFL for tasks assessed/performed Overall Cognitive Status: Within Functional Limits for tasks assessed                                        Exercises      General Comments        Pertinent Vitals/Pain Pain Assessment: 0-10 Pain Score: 6  Pain Location: R hip with activity Pain Descriptors / Indicators: Grimacing;Sore;Operative site guarding Pain Intervention(s): Monitored during session;Repositioned;Ice applied;RN gave pain meds during session    Home Living                      Prior Function            PT Goals (current goals can now be found in the care plan section) Progress towards PT goals: Progressing toward goals    Frequency    7X/week      PT Plan Current plan remains appropriate    Co-evaluation              AM-PAC PT "6 Clicks" Daily Activity  Outcome Measure  Difficulty turning over in bed (including adjusting bedclothes, sheets and blankets)?:  A Little Difficulty moving from lying on back to sitting on the side of the bed? : A Little Difficulty sitting down on and standing up from a chair with arms (e.g., wheelchair, bedside commode, etc,.)?: A Little Help needed moving to and from a bed to chair (including a wheelchair)?: A Little   Help needed climbing 3-5 steps with a railing? : A Little 6 Click Score: 15    End of Session Equipment Utilized During Treatment: Gait belt Activity Tolerance: Patient tolerated treatment well Patient left: in chair;with call bell/phone within reach;with family/visitor present Nurse Communication:  (pt ready for D/C to home) PT Visit Diagnosis: Difficulty in walking, not elsewhere classified (R26.2)     Time: 4982-6415 PT Time Calculation (min) (ACUTE ONLY): 39 min  Charges:  $Gait  Training: 8-22 mins $Therapeutic Exercise: 8-22 mins $Therapeutic Activity: 8-22 mins                    G Codes:       Rica Koyanagi  PTA [facility]  Acute  Rehab Pager      [phone]

## 2016-09-19 ENCOUNTER — Telehealth: Payer: Self-pay | Admitting: Internal Medicine

## 2016-09-19 ENCOUNTER — Telehealth (INDEPENDENT_AMBULATORY_CARE_PROVIDER_SITE_OTHER): Payer: Self-pay | Admitting: Orthopaedic Surgery

## 2016-09-19 DIAGNOSIS — M1990 Unspecified osteoarthritis, unspecified site: Secondary | ICD-10-CM | POA: Diagnosis not present

## 2016-09-19 DIAGNOSIS — J9610 Chronic respiratory failure, unspecified whether with hypoxia or hypercapnia: Secondary | ICD-10-CM | POA: Diagnosis not present

## 2016-09-19 DIAGNOSIS — I110 Hypertensive heart disease with heart failure: Secondary | ICD-10-CM | POA: Diagnosis not present

## 2016-09-19 DIAGNOSIS — K861 Other chronic pancreatitis: Secondary | ICD-10-CM | POA: Diagnosis not present

## 2016-09-19 DIAGNOSIS — Z471 Aftercare following joint replacement surgery: Secondary | ICD-10-CM | POA: Diagnosis not present

## 2016-09-19 DIAGNOSIS — I5022 Chronic systolic (congestive) heart failure: Secondary | ICD-10-CM | POA: Diagnosis not present

## 2016-09-19 NOTE — Telephone Encounter (Signed)
I left message for Randall Hiss giving verbal order for PT

## 2016-09-19 NOTE — Telephone Encounter (Signed)
Attempted to call [name] but went to voicemail. Left message to call back Attempted to call Ms. Snipes but also went to voicemail. Left message to call back.

## 2016-09-19 NOTE — Telephone Encounter (Signed)
Will forward to MD.  Patient was given these medications while in the hospital. [facility]

## 2016-09-19 NOTE — Telephone Encounter (Signed)
ERIK WITH [facility] REQUESTS PT FOR  3X WK FOR 1 [place] AND 2X [place] FOR 1 WK.  [phone]

## 2016-09-19 NOTE — Telephone Encounter (Signed)
Cindee Salt from [facility] called and wanted the nurse or doctor to call the patient and or his daughter because he was given Oxycodone and Robaxin. He is allergic to Hydrocodone and Acetaminophen and he is breaking out. He talked with the daughter and she is aware that he is allergic, but she only giving him smaller doses. He is very concerned about the patient. Please call patient and Cindee Salt if you have any questions. jw

## 2016-09-19 NOTE — Telephone Encounter (Signed)
I left voicemail for Randall Hiss advising.

## 2016-09-20 ENCOUNTER — Telehealth: Payer: Self-pay | Admitting: Internal Medicine

## 2016-09-20 DIAGNOSIS — I110 Hypertensive heart disease with heart failure: Secondary | ICD-10-CM | POA: Diagnosis not present

## 2016-09-20 DIAGNOSIS — K861 Other chronic pancreatitis: Secondary | ICD-10-CM | POA: Diagnosis not present

## 2016-09-20 DIAGNOSIS — J9610 Chronic respiratory failure, unspecified whether with hypoxia or hypercapnia: Secondary | ICD-10-CM | POA: Diagnosis not present

## 2016-09-20 DIAGNOSIS — Z471 Aftercare following joint replacement surgery: Secondary | ICD-10-CM | POA: Diagnosis not present

## 2016-09-20 DIAGNOSIS — I5022 Chronic systolic (congestive) heart failure: Secondary | ICD-10-CM | POA: Diagnosis not present

## 2016-09-20 DIAGNOSIS — M1990 Unspecified osteoarthritis, unspecified site: Secondary | ICD-10-CM | POA: Diagnosis not present

## 2016-09-20 NOTE — Telephone Encounter (Signed)
Will forward to MD to advise. Jazmin Hartsell,CMA  

## 2016-09-20 NOTE — Telephone Encounter (Addendum)
Was in he hospital on Saturday. Was confused and had issues with memory. He has never had any issues with his memory before. Has an allergy to Vicodin and is currently on Oxycodone. He is not confused anymore (this resolved early Sunday morning). Was not sure if this was because of all the new meds (robaxin and oxy).   In the hospital, BP was low. His BP 89/65 today with repeat 118/86 an hour later Cindee Salt). Currently not taking Robaxin. But taking Oxycodone. No fevers. Daughter reports patient does not have a rash. She reports he seems more tired than the day before. Normal PO intake.   I recommended that she check his blood pressure. If it is back to 80s/60s, I would recommend going to the ED. If not, then I asked her to make an appointment in clinic for tomorrow. Monitor overnight and if worsens, then go to the ED. Questions answered.   His daughter asked if she could stop Oxy IR and give him Tramadol PRN instead. I said this was okay.

## 2016-09-20 NOTE — Telephone Encounter (Signed)
Attempted to call back but went to voicemail. Left message to call back and leave a time that would work best to reach her.

## 2016-09-20 NOTE — Telephone Encounter (Signed)
Daughter is returning the doctor's call. Please call back

## 2016-09-21 ENCOUNTER — Encounter: Payer: Self-pay | Admitting: Internal Medicine

## 2016-09-21 ENCOUNTER — Ambulatory Visit (INDEPENDENT_AMBULATORY_CARE_PROVIDER_SITE_OTHER): Payer: Medicare Other | Admitting: Internal Medicine

## 2016-09-21 VITALS — BP 98/60 | HR 88 | Temp 98.5°F | Ht 69.0 in | Wt 121.2 lb

## 2016-09-21 DIAGNOSIS — I959 Hypotension, unspecified: Secondary | ICD-10-CM | POA: Diagnosis present

## 2016-09-21 DIAGNOSIS — R531 Weakness: Secondary | ICD-10-CM

## 2016-09-21 NOTE — Progress Notes (Signed)
[facility] Progress Note  Subjective:  [name] is a 67 y.o. male with history of laryngeal cancer s/p laryngectomy, seizures, liver cirrhosis, mild CHF, and arthritis who had recent R hip replacement for avascular necrosis. He is brought by his daughter today for concern for hypotension and weakness.  #Hypotension and weakness: - Daughter reports her father did well during the course of his hospitalization for R hip replacement 7/13-7/16 but did have some chills and confusion the day after surgery. The confusion she attributed to him being on both oxycodone and robaxin. - He was able to participate in PT and was not confused day of discharge - He was discharged home Monday, 7/16, and did well that night and Tuesday. Yesterday (Wednesday) he did not want to eat or drink or get out of bed. He has not been confused again. Today, she encouraged him to eat, but all he had was coffee and a small breakfast. She reports his BPs from today have been 70/40, 87/55, 98/50, and 99/62. She held his norvasc and bisoprolol this morning. She spoke with his PCP yesterday and switched from oxycodone to his regular tramadol for pain.  - Daughter is concerned because her father has acted like this before when he has had an infection. She says he is only allowed to get 5 PT sessions, but she does not feel like this is enough. She is worried he might need rehab instead and doesn't want something bad to happen to him at home.  - Patient says he can eat but just isn't hungry. He says he could eat and drink more. He denies abdominal pain, nausea, vomiting. Daughter says he does not like Boost, Ensure, Muscle Milk. No longer using G-tube.  - Felt dizzy prior to coming to appointment and with sitting up. Is using walker to help with ambulation. - Pain is present but improved by tramadol - Is passing lots of gas but only had BM 2 days ago ROS: No fevers, dysuria, abdominal pain, chest pain, or  cough/increased shortness of breath  Social: Former smoker  Objective: Blood pressure 98/60, pulse 88, temperature 98.5 F (36.9 C), temperature source Oral, height 5\' 9"  (1.753 m), weight 121 lb 3.2 oz (55 kg), SpO2 100 %. Body mass index is 17.9 kg/m. Constitutional: Very thin male, resting on exam table in NAD HENT: MMM Cardiovascular: RRR, S1, S2, no m/r/g.  Pulmonary/Chest: Effort normal and breath sounds normal. No respiratory distress.  Abdominal: Soft. +BS, NT, ND. G-tube in place, covered with clean dressing.  Musculoskeletal: No LE edema Neurological: AOx3, interactive. Skin: Dressing over R greater trochanter clean and dry and not TTP.  Vitals reviewed  Assessment/Plan: Hypotension - SBP 98 today off of antihypertensives. No obvious signs of infection on exam -- lungs CTAB, no abdominal pain, surgery site appears clean and non-erythematous. Could be secondary to stress of surgery but would have expected hypotension to have been a problem prior to discharge from hospital.  - Continue to hold BP medications norvasc and bisoprolol. Restart once BP 140/90 or above. Daughter should bring patient to ED if he is unable to take adequate PO or has SBP consistently </= 85.  - Will check CBC and CMP to check that hgb has remained stable after surgery and that he has no electrolyte abnormalities (history of hypokalemia) - Stressed importance of fluid intake.  - Precepted with Dr. Andria Frames.   Weakness - Will contact [place] LCSW Casimer Lanius to see if [facility] could assess patient  at home and determine if he would benefit from more PT or [place] sessions, especially as he is at high risk for readmission. He will have only 2 more PT sessions per current plan.  - Check nutritional status with albumin - Stressed importance of eating/drinking for healing and to avoid readmission - Continue tramadol prn instead of oxycodone, as recommended by patient's PCP   Future Appointments Date Time Provider  [place]  09/28/2016 1:00 PM Mcarthur Rossetti, MD PO-[facility] None   Olene Floss, MD [place], PGY-3

## 2016-09-21 NOTE — Patient Instructions (Signed)
[name],  I don't think you have any obvious signs of infection, but I want to check you for anemia and elevated white blood cell count today with labs. I will call with the results tomorrow.  Please do your best to eat and especially DRINK. If you cannot maintain your systolic blood pressure in the mid-80s, you will need to go to the Emergency Department.  I will have our social worker Casimer Lanius reach out about resources tomorrow.  Best, Dr. Ola Spurr

## 2016-09-22 DIAGNOSIS — I5022 Chronic systolic (congestive) heart failure: Secondary | ICD-10-CM | POA: Diagnosis not present

## 2016-09-22 DIAGNOSIS — Z471 Aftercare following joint replacement surgery: Secondary | ICD-10-CM | POA: Diagnosis not present

## 2016-09-22 DIAGNOSIS — I110 Hypertensive heart disease with heart failure: Secondary | ICD-10-CM | POA: Diagnosis not present

## 2016-09-22 DIAGNOSIS — J9610 Chronic respiratory failure, unspecified whether with hypoxia or hypercapnia: Secondary | ICD-10-CM | POA: Diagnosis not present

## 2016-09-22 DIAGNOSIS — M1990 Unspecified osteoarthritis, unspecified site: Secondary | ICD-10-CM | POA: Diagnosis not present

## 2016-09-22 DIAGNOSIS — K861 Other chronic pancreatitis: Secondary | ICD-10-CM | POA: Diagnosis not present

## 2016-09-22 LAB — COMPREHENSIVE METABOLIC PANEL
A/G RATIO: 0.8 — AB (ref 1.2–2.2)
ALBUMIN: 3.5 g/dL — AB (ref 3.6–4.8)
ALT: 96 IU/L — ABNORMAL HIGH (ref 0–44)
AST: 146 IU/L — ABNORMAL HIGH (ref 0–40)
Alkaline Phosphatase: 256 IU/L — ABNORMAL HIGH (ref 39–117)
BUN / CREAT RATIO: 15 (ref 10–24)
BUN: 16 mg/dL (ref 8–27)
Bilirubin Total: 1.3 mg/dL — ABNORMAL HIGH (ref 0.0–1.2)
CALCIUM: 9.2 mg/dL (ref 8.6–10.2)
CO2: 22 mmol/L (ref 20–29)
CREATININE: 1.08 mg/dL (ref 0.76–1.27)
Chloride: 97 mmol/L (ref 96–106)
GFR calc Af Amer: 83 mL/min/{1.73_m2} (ref >59)
GFR, EST NON AFRICAN AMERICAN: 72 mL/min/{1.73_m2} (ref >59)
GLOBULIN, TOTAL: 4.2 g/dL (ref 1.5–4.5)
Glucose: 99 mg/dL (ref 65–99)
POTASSIUM: 4.5 mmol/L (ref 3.5–5.2)
SODIUM: 137 mmol/L (ref 134–144)
Total Protein: 7.7 g/dL (ref 6.0–8.5)

## 2016-09-22 LAB — CBC
HEMATOCRIT: 32.4 % — AB (ref 37.5–51.0)
HEMOGLOBIN: 10.7 g/dL — AB (ref 13.0–17.7)
MCH: 29.3 pg (ref 26.6–33.0)
MCHC: 33 g/dL (ref 31.5–35.7)
MCV: 89 fL (ref 79–97)
Platelets: 341 10*3/uL (ref 150–379)
RBC: 3.65 x10E6/uL — ABNORMAL LOW (ref 4.14–5.80)
RDW: 14.8 % (ref 12.3–15.4)
WBC: 5.1 10*3/uL (ref 3.4–10.8)

## 2016-09-22 NOTE — Assessment & Plan Note (Addendum)
-   SBP 98 today off of antihypertensives. No obvious signs of infection on exam -- lungs CTAB, no abdominal pain, surgery site appears clean and non-erythematous. Could be secondary to stress of surgery but would have expected hypotension to have been a problem prior to discharge from hospital.  - Continue to hold BP medications norvasc and bisoprolol. Restart once BP 140/90 or above. Daughter should bring patient to ED if he is unable to take adequate PO or has SBP consistently </= 85.  - Will check CBC and CMP to check that hgb has remained stable after surgery and that he has no electrolyte abnormalities (history of hypokalemia) - Stressed importance of fluid intake.  - Precepted with Dr. Andria Frames.

## 2016-09-22 NOTE — Assessment & Plan Note (Addendum)
-   Will contact [place] LCSW Casimer Lanius to see if [facility] could assess patient at home and determine if he would benefit from more PT or [place] sessions, especially as he is at high risk for readmission. He will have only 2 more PT sessions per current plan.  - Check nutritional status with albumin - Stressed importance of eating/drinking for healing and to avoid readmission - Continue tramadol prn instead of oxycodone, as recommended by patient's PCP

## 2016-09-25 DIAGNOSIS — M1990 Unspecified osteoarthritis, unspecified site: Secondary | ICD-10-CM | POA: Diagnosis not present

## 2016-09-25 DIAGNOSIS — K861 Other chronic pancreatitis: Secondary | ICD-10-CM | POA: Diagnosis not present

## 2016-09-25 DIAGNOSIS — I110 Hypertensive heart disease with heart failure: Secondary | ICD-10-CM | POA: Diagnosis not present

## 2016-09-25 DIAGNOSIS — Z471 Aftercare following joint replacement surgery: Secondary | ICD-10-CM | POA: Diagnosis not present

## 2016-09-25 DIAGNOSIS — J9610 Chronic respiratory failure, unspecified whether with hypoxia or hypercapnia: Secondary | ICD-10-CM | POA: Diagnosis not present

## 2016-09-25 DIAGNOSIS — I5022 Chronic systolic (congestive) heart failure: Secondary | ICD-10-CM | POA: Diagnosis not present

## 2016-09-27 ENCOUNTER — Other Ambulatory Visit: Payer: Self-pay | Admitting: Internal Medicine

## 2016-09-27 DIAGNOSIS — R531 Weakness: Secondary | ICD-10-CM

## 2016-09-27 DIAGNOSIS — M1990 Unspecified osteoarthritis, unspecified site: Secondary | ICD-10-CM | POA: Diagnosis not present

## 2016-09-27 DIAGNOSIS — K861 Other chronic pancreatitis: Secondary | ICD-10-CM | POA: Diagnosis not present

## 2016-09-27 DIAGNOSIS — I5022 Chronic systolic (congestive) heart failure: Secondary | ICD-10-CM | POA: Diagnosis not present

## 2016-09-27 DIAGNOSIS — I110 Hypertensive heart disease with heart failure: Secondary | ICD-10-CM | POA: Diagnosis not present

## 2016-09-27 DIAGNOSIS — Z471 Aftercare following joint replacement surgery: Secondary | ICD-10-CM | POA: Diagnosis not present

## 2016-09-27 DIAGNOSIS — J9610 Chronic respiratory failure, unspecified whether with hypoxia or hypercapnia: Secondary | ICD-10-CM | POA: Diagnosis not present

## 2016-09-27 NOTE — Progress Notes (Signed)
Discussed patient with [facility] LCSW Casimer Lanius. Had asked how we might be able to link him with more resources. Placed [facility] referral, as she suggested. Patient had recent hospitalization for R hip replacement and subsequently had hypotension and weakness post-discharge. Could also extend PT services.

## 2016-09-28 ENCOUNTER — Other Ambulatory Visit: Payer: Self-pay | Admitting: *Deleted

## 2016-09-28 ENCOUNTER — Ambulatory Visit (INDEPENDENT_AMBULATORY_CARE_PROVIDER_SITE_OTHER): Payer: Medicare Other | Admitting: Orthopaedic Surgery

## 2016-09-28 DIAGNOSIS — Z96641 Presence of right artificial hip joint: Secondary | ICD-10-CM

## 2016-09-28 MED ORDER — TRAMADOL HCL 50 MG PO TABS
100.0000 mg | ORAL_TABLET | Freq: Four times a day (QID) | ORAL | 0 refills | Status: DC | PRN
Start: 2016-09-28 — End: 2019-07-17

## 2016-09-28 NOTE — Progress Notes (Signed)
The patient is 2 weeks status post a right total hip arthroplasty through direct anterior approach. He says he already feels significantly better since the surgery.  On examination his had excellent range of motion of his hip. He is a very thin cachectic individual sutures a large seroma which I drained about 100 mL of fluid off gave Ms. relief. His incision looks great and that is new Steri-Strips applied. His ligaments are equal.  A continued increase his activities. We'll try some trauma and off for pain. I will see him back in 4 weeks' 0 is doing overall but no x-rays are needed.

## 2016-09-28 NOTE — Patient Outreach (Signed)
[facility]) Care Management  09/28/2016  [name] 1951/02/26 [id number]  Telephone Screen  Referral Date: 09/27/16 Referral Source: MD referral (Dr. Olene Floss) Referral Reason: High Risk of Readmission Insurance: Medicare  Outreach attempt #1 to patient. No answer. RN CM left HIPAA compliant message along with contact info.    Plan: RN CM will contact patient within 3 business days.   [name], RN, BSN, MHA/MSL, [place] Telephonic Care Manager Coordinator [facility] Direct Phone: [phone] Toll Free: [phone] Fax: [phone]

## 2016-10-03 ENCOUNTER — Ambulatory Visit: Payer: Self-pay | Admitting: *Deleted

## 2016-10-03 ENCOUNTER — Other Ambulatory Visit: Payer: Self-pay | Admitting: *Deleted

## 2016-10-03 NOTE — Patient Outreach (Signed)
[place] [facility]) Care Management  10/03/2016  [name] 12-02-1950 [id number]   Transition of Care Referral Date: 09/27/16 Referral Source: MD referral (Dr. Olene Floss) Referral Reason: High Risk of Readmission Insurance: Medicare  Outreach attempt #2 to patient. No answer. RN CM left HIPAA compliant message along with contact info.   Plan: RN CM will contact patient within 3 business days.    [name], RN, BSN, MHA/MSL, [place] Telephonic Care Manager Coordinator [facility] Direct Phone: [phone] Toll Free: [phone] Fax: [phone]

## 2016-10-06 ENCOUNTER — Other Ambulatory Visit: Payer: Self-pay | Admitting: *Deleted

## 2016-10-06 ENCOUNTER — Ambulatory Visit: Payer: Self-pay | Admitting: *Deleted

## 2016-10-06 NOTE — Patient Outreach (Signed)
[place] [facility]) Care Management  10/06/2016  [name] 03/08/1950 [id number]   Telephone Screen  Referral Date: 09/27/16 Referral Source: MD office (Dr. Olene Floss) Referral Reason: High risk of readmission Insurance: Medicare  Outreach attempt # 3, spoke with patient's daughter [name]). HIPAA verified with daughter.   Social:  Patient lives with his daughter [name]). Patient requires assistance with ADLs. [name] transports patient to all medical appointments. [name] reported, she's been caring for her father about 7 to 8 years. He uses a RW to assist with mobility. Patient is active with [facility] (PT), which is limited to 5 session, per EMR. As of 09/21/16, patient only had 2 PT visits left.   Conditions: Past Medical Hx: Laryngeal Cancer, Laryngectomy, Liver Cirrhosis, CHF, Arthritis, Right Hip Replacement, Hypothyroidism, HLD, HTN, Seizures Patient was admitted at [facility] from 09/15/16 to 09/18/16 for avascular necrosis of right hip. A right total hip arthroplasty was completed during patient's most recent hospital admission. Patient was admitted/discharged from [facility] through 07/04/16 to 07/07/16 for Erysipelas. Patient had an ED visit on 05/24/16 for accidental removal of his G-Tube. [name] reported, patient is trach dependent. He is unable to talk on the phone. Patient uses a high frequency telephone, which needs to be reconnected d/t patient and daughter relocating/moving. Patient can read text messages on this phone. He has a voice prosthesis to assist with communication.  Patient had several falls within the last year. [name] reported, patient had injuries from a fall in 2017. Patient uses a Jevity feeding pump, as needed. He can eat foods by mouth. [name] reported, patient's eating habits varies, which depends on "how he feels that day". Patient last PCP visit was related to weakness and hypotension. B/P monitoring at home showed consistent  low readings. Changes were made to patient's medication regimen. Referral received from MD's office for high risk of readmission.  Medications: [name] reported, patient takes 6 medications per day. She verbalized, patient had numerous of side effects from medications. She reports side effects to the patient's doctors immediately, when noticed. She mentioned having a pharmacist to review patient's medications. EMR review shows patient taking greater than 10 medications per day.   Appointments: Patient most recent PCP appointment was on 09/21/16.  Advanced Directives: [name] verbalized having a Living Will and HPOA. She is patient's HPOA. [name] would like to review patient's paperwork with [facility] RN, if warranted.  Consent: [facility] services reviewed and discussed with spouse. He agreed to services.   Plan: RN CM will send referral to [facility] RN for further in home eval/assessment of care needs and management of chronic conditions. RN CM will send [facility] referral for possible assistance with community resources. RN CM will send [place] referral for polypharmacy/side effects. RN CM advised patient to contact RN CM for any needs or concerns.  [name], RN, BSN, MHA/MSL, [place] Telephonic Care Manager Coordinator [facility] Direct Phone: [phone] Toll Free: [phone] Fax: [phone]

## 2016-10-09 ENCOUNTER — Encounter: Payer: Self-pay | Admitting: *Deleted

## 2016-10-09 ENCOUNTER — Other Ambulatory Visit: Payer: Self-pay | Admitting: *Deleted

## 2016-10-09 NOTE — Patient Outreach (Signed)
[place] [facility]) Care Management  10/09/2016  [name] May 22, 1950 [id number]   CSW was able to make initial contact with patient's daughter, [name] today to perform phone assessment, as well as assess and assist with social work needs and services.  CSW introduced self, explained role and types of services provided through [place] [facility] ([place] [facility]).  CSW further explained to [name] that CSW works with patient's RNCM, also with [place] [facility], [name]. CSW then explained the reason for the call, indicating that [name] thought that patient would benefit from social work services and resources to assist with arranging home care services for patient.  CSW obtained two HIPAA compliant identifiers from [name], which included patient's name and date of birth. [name] admits that she is in need of assistance with regards to caring for patient in the home, with whom she currently resides.  CSW was only able to obtain a verbal consent from patient to converse with [name], as patient has a tracheostomy and is unable to talk clearly through the phone.  [name] is also looking for a reputable home health agency for patient, to provide for his tracheostomy supplies and oxygen, as well as address nursing and physical therapy needs.  CSW agreed to mail [name] a complete list of home health agencies, as well as a list of private agency sitters.  [name] reported that she is able to make all calls on behalf of patient, she just requires a list to work from. No additional social work needs have been identified at this time, as [name] reported that she is committed to trying to care for patient in the home, for as long as medically possible.  [name] prepares patient's feeding tube, administers patient's medications and assists patient with all activities of daily living.  CSW agreed to follow-up  with [name] in one week, to ensure that patient she received the packet of resource information mailed to her home by CSW, as well as to answer any questions she may have, at that time. [name], BSW, MSW, LCSW  Licensed Education officer, [facility]  Mailing [place] [address], [place], [place] [address] Physical Address-[address], [place], [place] [address] Toll Free Main # [phone] Fax # [phone] Cell # [phone]  Office # [phone] [name]

## 2016-10-10 ENCOUNTER — Other Ambulatory Visit: Payer: Self-pay

## 2016-10-10 NOTE — Patient Outreach (Signed)
[place] [facility]) Care Management  10/10/16  [name] [birth year] [id number]  RNCM received referral from [name], telephonic care manager on 10/09/2016 for transition of care, further in home eval/assessment of home care needs and management of chronic conditions.   Patient has a past medical history of laryngeal cancer s/p laryngectomy, seizures, liver cirrhosis, mild CHF, arthritis and recent right hip replacement for avascular necrosis (7/13-7/16/2018). Discharge is less than 30 days, will pick up as transition of care.   Patient has been having post discharge concerns of hypotension, confusion, and decreased appetite. Was referred to [place] [facility] by Dr. Rogue Solomon at [facility] for home assessment and determine if patient appropriate for additional home health and PT (his sessions had been limited to only 5). Patient is also a high risk for readmission.  Patient has home health with [facility]. He is trach dependent (unable to talk on phone), has a g-tube, and has also had several falls in the last year.   He lives with his daughter, [name] who is also his health care power of attorney.  RNCM made initial attempt to reach patient to initiate transition of care follow up without success. RNCM left HIPAA compliant voicemail with contact information and invited return call. ([name] only phone number listed belongs to his daughter, [name]).  Plan: RNCM to make an additional outreach attempt on 10/11/2016 to initiate [facility] program and schedule a home visit to complete a home evaluation.   Nathaniel R. Moriah Loughry, RN, BSN, [place] Management Coordinator [phone]

## 2016-10-10 NOTE — Patient Outreach (Signed)
Request received from Joanna Saporito, LCSW to mail patient personal care resources.  Information mailed today. 

## 2016-10-11 ENCOUNTER — Telehealth: Payer: Self-pay | Admitting: Pharmacist

## 2016-10-11 ENCOUNTER — Other Ambulatory Visit: Payer: Self-pay

## 2016-10-11 NOTE — Patient Outreach (Signed)
[place] [facility]) Care Management  10/11/2016  MARSHELL DILAURO Jul 26, 1950 [id number]   [name] daughter was called be referral for medication management. No answer. HIPAA compliant message left on patient's voicemail.  Plan:  Call patient back in 3-5 business days.  Elayne Guerin, PharmD, [place] Clinical Pharmacist [phone]

## 2016-10-13 ENCOUNTER — Other Ambulatory Visit: Payer: Self-pay | Admitting: Pharmacist

## 2016-10-13 NOTE — Patient Outreach (Signed)
[facility]) Care Management  Late entry for 10/11/2016  [name] 1951-01-09 [id number]  RNCM received referral for transition of care on 10/09/2016. Patient was admitted  7/13 - 09/18/2016 for a right total hip arthroplasty. Since discharge home, he has had some issues related to hypotension and confusion.  Patient has a a past medical history of laryngeal cancer s/p laryngectomy, seizures, liver cirrhosis, mild CHF, arthritis, and recent right hip replacement for avascular necrosis.  [name] daughter, [name] is his primary caregiver and she takes patient to all of his appointments. Per review, patient has had a follow up with PCP on 09/21/2016 (where [facility] referral initiated) and an orthopedic follow up on 09/28/2016. Patient also has had [facility] for PT, but was only approved for 5 visits.  Patient is trach dependent and has a high frequency phone, but it is currently not connected and patient is unable to talk on the phone.    Successful outreach completed with patient's daughter, [name]. Patient identification verified. RNCM educated [name] about [place] Management and role of RNCM. She was agreeable to a home visit next week to assess and meet patient.  She stated that patient has been experiencing confusion and hypotension since his discharge home and this has concerned her. She stated that he has been doing "ok" sthis last week and a half. She stated that he is "fairly stable" now. She stated that his doctors have been making adjustments to his medications. His pain medicine has been changed and they are holding his blood pressure medicines until his blood pressure returns to normal (around 140/90). She stated that she is checking his blood pressure at home daily and it was 98/62 today.   She reported that [name] is unsteady at times. He has now completed all 5 of his PT visits were approved. She is unsure why he was only approved for 5 visits,  but his PT with [facility] felt he needed more therapy. She stated that when they told his PCP, that is when they ordered [facility] to come out and assess. She stated that patient has not had any falls since the beginning of 2018, but they are concerned he is at risk with his low blood pressures and unsteadiness.   She stated that patient is seen at heart failure clinic and also sees Dr. Dwyane Dee for his thyroid.   Fall Risk  10/09/2016 10/06/2016 09/21/2016 08/13/2014 07/10/2013  Falls in the past year? Yes Yes No Yes Yes  Number falls in past yr: 2 or more 2 or more - 2 or more 2 or more  Injury with Fall? No No - No -  Risk Factor Category  High Fall Risk High Fall Risk - - -  Risk for fall due to : History of fall(s);Impaired balance/gait;Impaired mobility;Medication side effect History of fall(s);Impaired balance/gait;Impaired mobility;Medication side effect - - -  Follow up Education provided;Falls prevention discussed Falls evaluation completed - - -   Depression screen [facility] 2/9 10/09/2016 09/21/2016 07/28/2016 07/10/2016 09/23/2014  Decreased Interest 0 0 0 0 0  Down, Depressed, Hopeless 0 0 0 0 0  PHQ - 2 Score 0 0 0 0 0    She currently denies any urgent concerns or needs and is agreeable for home visit next week.  Plan: Home visit scheduled for next week. [facility] CM Care Plan Problem Two     Most Recent Value  Care Plan Problem Two  Patient at risk for hospital  readmission related to hypotension as evidenced by low blood pressure readings and symptomatic hypotension  Role Documenting the Problem Two  Care Management [place] for Problem Two  Active  Interventions for Problem Two Long Term Goal   RNCM ensured patient is following up with PCP. RNCM to provide education on fall precautions and safety when patient experiencing symptoms of low blood pressure  [facility]N Long Term Goal  Patient will not have a hospital admission within the next 31 days  [facility]N Long Term Goal Start Date  10/11/16  [facility] CM  Short Term Goal #1   [name] family member will check his blood pressure at least once a day for the next 2 weeks  [facility]N CM Short Term Goal #1 Start Date  10/11/16  Interventions for Short Term Goal #2   RNCM ensured patient's daughter is checking his blood pressure daily,  RNCM to educate patient and his family on paramaters to report to PCP      [place] R. Kadeem Hyle, RN, BSN, [place] Management Coordinator [phone]

## 2016-10-14 NOTE — Patient Outreach (Signed)
[facility]) Care Management  Muir Solomon   10/14/2016  [name] 1951-01-03 [id number]  Subjective: Patient was called regarding medication side effects and cost of his medications.  Spoke with his daughter, [name]) who is on the patient's consent form. HIPAA identifiers were obtained. Patient is a 66 year old male with multiple medical conditions including but not limited to:  History of laryngeal cancer, CHF, hypertension, hyperlipidemia, alcohol dependence, and osteoarthritis.  [name] daughter reported the patient previously experienced mental status changes and orthostatic hypotension. He had been taking methocarbamol, tramadol and oxycodone together. Patient reported oxycodone was discontinued and the patient stopped the methocarbamol. According to the patient's daughter the previously mentioned side effects went away after oxycodone was discontinued.   Objective:   Encounter Medications: Outpatient Encounter Prescriptions as of 10/13/2016  Medication Sig Note  . Alum & Mag Hydroxide-Simeth (MAGIC MOUTHWASH W/LIDOCAINE) SOLN Take 5 mLs by mouth 4 (four) times daily. 10/13/2016: PRN-ONLY  . amLODipine (NORVASC) 5 MG tablet TAKE 1 TABLET(5 MG) BY MOUTH DAILY   . aspirin EC 81 MG tablet Take 1 tablet (81 mg total) by mouth 2 (two) times daily after a meal.   . bisoprolol (ZEBETA) 10 MG tablet TAKE 1 TABLET(10 MG) BY MOUTH DAILY   . diclofenac sodium (VOLTAREN) 1 % GEL Apply 2 g topically 3 (three) times daily as needed (hip pain/back pain).   Marland Kitchen guaiFENesin (MUCINEX) 600 MG 12 hr tablet Take 1 tablet (600 mg total) by mouth 2 (two) times daily.   Marland Kitchen levothyroxine (SYNTHROID, LEVOTHROID) 112 MCG tablet TAKE 1 TABLET(112 MCG) BY MOUTH DAILY BEFORE BREAKFAST   . Menthol, Topical Analgesic, (BIOFREEZE) 4 % GEL Apply to affected area.   . Potassium Chloride ER 20 MEQ TBCR TAKE 1 TABLET BY MOUTH DAILY   . traMADol (ULTRAM) 50 MG tablet Take 2 tablets (100 mg  total) by mouth every 6 (six) hours as needed.   . methocarbamol (ROBAXIN) 500 MG tablet Take 1 tablet (500 mg total) by mouth every 6 (six) hours as needed for muscle spasms. (Patient not taking: Reported on 10/13/2016)   . Nutritional Supplements (FEEDING SUPPLEMENT, JEVITY 1.5 CAL,) LIQD Place 70 mL/hr into feeding tube continuous. 07/05/2016: Pt has not needed recently. Only gets when he loses appetite.   . [DISCONTINUED] oxyCODONE (OXY IR/ROXICODONE) 5 MG immediate release tablet Take 1-2 tablets (5-10 mg total) by mouth every 4 (four) hours as needed for breakthrough pain. (Patient not taking: Reported on 10/13/2016)    No facility-administered encounter medications on file as of 10/13/2016.     Functional Status: In your present state of health, do you have any difficulty performing the following activities: 10/09/2016 09/15/2016  Hearing? N N  Vision? N N  Difficulty concentrating or making decisions? N N  Walking or climbing stairs? Y N  Dressing or bathing? N N  Doing errands, shopping? Y N  Preparing Food and eating ? Y -  Using the Toilet? N -  In the past six months, have you accidently leaked urine? Y -  Do you have problems with loss of bowel control? N -  Managing your Medications? Y -  Managing your Finances? Y -  Housekeeping or managing your Housekeeping? Y -  Some recent data might be hidden    Fall/Depression Screening: Fall Risk  10/09/2016 10/06/2016 09/21/2016  Falls in the past year? Yes Yes No  Number falls in past yr: 2 or more 2 or more -  Injury with  Fall? No No -  Risk Factor Category  High Fall Risk High Fall Risk -  Risk for fall due to : History of fall(s);Impaired balance/gait;Impaired mobility;Medication side effect History of fall(s);Impaired balance/gait;Impaired mobility;Medication side effect -  Follow up Education provided;Falls prevention discussed Falls evaluation completed -   PHQ 2/9 Scores 10/09/2016 10/06/2016 10/06/2016 09/21/2016 07/28/2016 07/10/2016  09/23/2014  PHQ - 2 Score 0 - - 0 0 0 0  Exception Documentation Medical reason Medical reason Medical reason - - - -  Not completed Patient has a tracheostomy; information obtained from daughter [name]. - Patient has a tracheostomy, unable to talk on the phone.  - - - -      Assessment: [name] medications were reconciled via telephone.   Drugs sorted by system:  Neurologic/Psychologic:  Cardiovascular: Bisoprolol Amlodipine Aspirin  Pulmonary/Allergy: guaifenesin  Gastrointestinal:  Endocrine: levothyroxine  Renal:  Topical:  Pain: Diclofenac sodium  Biofreeze (unable to purchase due to cost) Tramadol Methocarbamol (does not take)  Vitamins/Minerals: Potassium Chloride  Miscellaneous Magic Mouth Wash   Medication Assistance Findings: Patient has full extra help [name] daughter said she wanted help purchasing Biofreeze Biofreeze is an OTC product and is "excluded" from being covered on Medicare Part D plans [employer] was called to see if the patient has an OTC benefit on his plan Unfortunately, because the patient does not have an "advantage plan" he does not have the OTC drug benefit. Biofreeze is a covered product on the [employer] OTC Drug Benefit  While speaking with the [facility] representative, the patient said her father had received an OTC Catalog in the past but has not gotten on in the past two years. She was unaware anything about his insurance has changed.  [name] daughter was encouraged to get in touch with [facility] in October during the open enrollment period to see which plan would best benefit her father.  Plan:  Follow up with the patient in October to encourage [facility] and investigation of the best plan for the patient.

## 2016-10-16 ENCOUNTER — Encounter: Payer: Self-pay | Admitting: *Deleted

## 2016-10-16 ENCOUNTER — Ambulatory Visit: Payer: Self-pay | Admitting: Pharmacist

## 2016-10-16 ENCOUNTER — Other Ambulatory Visit: Payer: Self-pay | Admitting: *Deleted

## 2016-10-16 NOTE — Patient Outreach (Signed)
[facility]) Care Management  10/16/2016  JARELL MCEWEN 09/08/1950 [id number]   CSW was able to make contact with patient's daughter, [name] today to follow-up regarding social work services and resources, as well as to ensure that she received the packet of resource information mailed to her home. Mrs. Snipes confirmed that she did and that she has already been in contact with several agencies regarding home care services for patient.  CSW inquired as to how CSW could be of assistance to patient and Mrs. Snipes at this time.  Mrs. Snipes denied being able to identify any social work specific needs at present, but encouraged CSW to follow-up with her in one week, after Mrs. Snipes has had an opportunity to visit with Tish Men, [facility], also with [place] [facility].  CSW voiced understanding and was agreeable to this plan.  CSW will contact Mrs. Snipes on Tuesday, August 21st to follow-up. Nat Christen, BSW, MSW, LCSW  Licensed Education officer, environmental Health System  Mailing [place] [address] [place], [place] [address] Physical Address-[address], [place], [place] [address] Toll Free Main # [phone] Fax # [phone] Cell # [phone]  Office # [phone] [email or web address]

## 2016-10-17 ENCOUNTER — Ambulatory Visit: Payer: Self-pay | Admitting: *Deleted

## 2016-10-19 ENCOUNTER — Other Ambulatory Visit: Payer: Self-pay

## 2016-10-24 ENCOUNTER — Other Ambulatory Visit: Payer: Self-pay | Admitting: *Deleted

## 2016-10-24 NOTE — Patient Outreach (Signed)
[facility]) Care Management  10/24/2016  [name] 06-19-1950 [id number]   CSW made an attempt to try and contact patient's daughter, [name] today to follow-up regarding social work services and resources for patient; however, Mrs. Mateo Flow was unavailable.  A HIPAA complaint message was left for Mrs. Snipes on voicemail and CSW is currently awaiting a return call.  CSW will make a second outreach attempt within the next week, if CSW does not receive a return call from Mrs. Snipes, in the meantime. Nat Christen, BSW, MSW, LCSW  Licensed Education officer, environmental Health System  Mailing [place] [address] [place], [place] [address] Physical Address-[address], [place], [place] [address] Toll Free Main # [phone] Fax # [phone] Cell # [phone]  Office # [phone] [email or web address]

## 2016-10-26 ENCOUNTER — Other Ambulatory Visit: Payer: Self-pay | Admitting: Endocrinology

## 2016-10-26 ENCOUNTER — Ambulatory Visit (INDEPENDENT_AMBULATORY_CARE_PROVIDER_SITE_OTHER): Payer: Medicare Other | Admitting: Orthopaedic Surgery

## 2016-10-26 DIAGNOSIS — Z96641 Presence of right artificial hip joint: Secondary | ICD-10-CM

## 2016-10-26 MED ORDER — OXYCODONE-ACETAMINOPHEN 5-325 MG PO TABS: 1.0000 | ORAL_TABLET | Freq: Three times a day (TID) | ORAL | 0 refills | Status: DC | PRN

## 2016-10-26 NOTE — Progress Notes (Signed)
Patient is now between 6 and 7 weeks status post a right total hip arthroplasty due to severe hip disease. He exam [name] without assistive device and doing much better overall. He says he is close to pain-free but he did have a rough night last night.  On exam his leg lengths are equal. Exam related without assistive device. His incision looks great. There is a small seroma and I drained about 30 mL off this which is more obvious due to the fact that he is very thin and cachectic.  This point I'll refill his pain medicine one more time. I don't need to see him back for 6 months. At that visit I would like a low AP pelvis and a lateral of his right operative hip. All questions were encouraged and answered.

## 2016-10-27 ENCOUNTER — Other Ambulatory Visit: Payer: Self-pay

## 2016-10-27 NOTE — Patient Outreach (Signed)
[facility]) Care Management  10/27/16  SHMUEL GIRGIS 03/28/1950  [id number]  Attempted to reach patient's daughter, [name] for transition of care follow up without success. Left HIPAA compliant voicemail with RNCM contact information and invited her to call back.  [place] R. Rawlins Stuard, RN, BSN, [place] Management Coordinator [phone]

## 2016-10-27 NOTE — Patient Outreach (Signed)
[facility]) Care Management  [facility] Care Manager  Late entry for 10/19/2016   KOI ZANGARA 1950-11-19 [id number]  Home visit completed with patient. His daughter was present for visit as well.  Subjective: Patient stated he has been doing well since discharge.  Objective: Blood pressure 124/88, pulse 85, SpO2 97 %. Patient is alert and oriented x 3.  Respiratory effort normal and breath sounds clear to auscultation Abdomen soft with bowel sounds present x 4; PEG tube in place Skin is warm and dry.  Encounter Medications:  Outpatient Encounter Prescriptions as of 10/19/2016  Medication Sig Note  . Alum & Mag Hydroxide-Simeth (MAGIC MOUTHWASH W/LIDOCAINE) SOLN Take 5 mLs by mouth 4 (four) times daily. 10/13/2016: PRN-ONLY  . amLODipine (NORVASC) 5 MG tablet TAKE 1 TABLET(5 MG) BY MOUTH DAILY   . aspirin EC 81 MG tablet Take 1 tablet (81 mg total) by mouth 2 (two) times daily after a meal.   . bisoprolol (ZEBETA) 10 MG tablet TAKE 1 TABLET(10 MG) BY MOUTH DAILY   . diclofenac sodium (VOLTAREN) 1 % GEL Apply 2 g topically 3 (three) times daily as needed (hip pain/back pain).   Marland Kitchen guaiFENesin (MUCINEX) 600 MG 12 hr tablet Take 1 tablet (600 mg total) by mouth 2 (two) times daily.   Marland Kitchen levothyroxine (SYNTHROID, LEVOTHROID) 112 MCG tablet TAKE 1 TABLET(112 MCG) BY MOUTH DAILY BEFORE BREAKFAST   . Menthol, Topical Analgesic, (BIOFREEZE) 4 % GEL Apply to affected area.   . methocarbamol (ROBAXIN) 500 MG tablet Take 1 tablet (500 mg total) by mouth every 6 (six) hours as needed for muscle spasms. (Patient not taking: Reported on 10/13/2016)   . Nutritional Supplements (FEEDING SUPPLEMENT, JEVITY 1.5 CAL,) LIQD Place 70 mL/hr into feeding tube continuous. 07/05/2016: Pt has not needed recently. Only gets when he loses appetite.   . Potassium Chloride ER 20 MEQ TBCR TAKE 1 TABLET BY MOUTH DAILY   . traMADol (ULTRAM) 50 MG tablet Take 2 tablets (100 mg total) by mouth every 6 (six)  hours as needed.    No facility-administered encounter medications on file as of 10/19/2016.     Functional Status:  In your present state of health, do you have any difficulty performing the following activities: 10/09/2016 09/15/2016  Hearing? N N  Vision? N N  Difficulty concentrating or making decisions? N N  Walking or climbing stairs? Y N  Dressing or bathing? N N  Doing errands, shopping? Y N  Preparing Food and eating ? Y -  Using the Toilet? N -  In the past six months, have you accidently leaked urine? Y -  Do you have problems with loss of bowel control? N -  Managing your Medications? Y -  Managing your Finances? Y -  Housekeeping or managing your Housekeeping? Y -  Some recent data might be hidden    Fall/Depression Screening: Fall Risk  10/09/2016 10/06/2016 09/21/2016  Falls in the past year? Yes Yes No  Number falls in past yr: 2 or more 2 or more -  Injury with Fall? No No -  Risk Factor Category  High Fall Risk High Fall Risk -  Risk for fall due to : History of fall(s);Impaired balance/gait;Impaired mobility;Medication side effect History of fall(s);Impaired balance/gait;Impaired mobility;Medication side effect -  Follow up Education provided;Falls prevention discussed Falls evaluation completed -   PHQ 2/9 Scores 10/09/2016 10/06/2016 10/06/2016 09/21/2016 07/28/2016 07/10/2016 09/23/2014  PHQ - 2 Score 0 - - 0 0 0 0  Exception Documentation Medical reason Medical reason Medical reason - - - -  Not completed Patient has a tracheostomy; information obtained from daughter - [name]. - Patient has a tracheostomy, unable to talk on the phone.  - - - -    Assessment:  Patient has been doing well since discharge home. He has a PEG tube, but is currently not using it. He is able to tolerate soft foods. He does not have any teeth and chewing meats is difficult for him. 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  Patient has been ambulating well within home with no DME. He currently denies any pain, but does occasionally experience pain in right hip if he has been up on his feet a lot. He has tramadol for pain and takes it only if he needs to and it does help to relieve his pain. [name] surgical site is healing with no signs and symptoms of infection per patient's daughter.   Patient has no other complaints or concerns. RNCM provided education about fall safety and precautions in the home. Patient does have a small puppy in the home.   Plan: Will continue to follow patient for transition of care.  [place] R. Elizebath Wever, RN, BSN, [place] Management Coordinator [phone]

## 2016-10-30 ENCOUNTER — Other Ambulatory Visit: Payer: Self-pay

## 2016-10-30 NOTE — Patient Outreach (Signed)
[facility]) Care Management  10/30/16  KERMAN PFOST 08-31-1950 [id number]  12:22 pm Successful outreach completed to the [facility] at [phone]. Spoke with Nira Conn, providing patient identification and reason for call (concerns about metoprolol being held since middle of July due to low blood pressure). Nira Conn stated that they cannot provide any recommendation because it has been so long since they have seen him. She stated that the last time he was in the office was in 2014. He will need to schedule an appointment to be seen and evaluated. RNCM to contact patient's daughter to provide her with this information that patient will need to schedule an appointment. 12:32 pm  Attempted to reach patient's daughter, [name] with no success. Left HIPAA compliant voicemail with RNCM contact information and invited her to call back. [place] R. Denita Lun, RN, BSN, [place] Management Coordinator [phone]

## 2016-10-30 NOTE — Patient Outreach (Signed)
[facility]) Care Management  10/30/16  KIKO RIPP 1951/01/14 [id number]  Successful outreach completed with patient's daughter, [name]. Patient identification verified.   Per [name], she saw where [facility] had called last Friday but they were on the road headed to the beach. Patient went with [name] and family to the beach and tolerated it well. She stated that they all had a great time. She stated that this was the first time he has traveled out of [place] in 8 years.   [name] reported that patient's blood pressure readings are still running low so he is still not taking any of his blood pressure medicines. She stated that on the low end, it averages around 112/60 and the highest it has been was around 130/80-90. She stated that sometimes in the mornings, it was 99/60, but by mid-day would be over 338 systolic.   She also stated that there were a couple of days that he needed to take tramadol due to hip pain, but reported that they were more active and he was doing a little more walking than he does in the home. She stated that the pain was tolerable with the tramadol and he is doing better now and not complaining of hip pain. Patient remains ambulatory without any problems.  [name] stated that their only concern is that he is still not taking his Metoprolol. She voiced concerns that this medication was one he was taking for both his heart failure and his blood pressure and he has been off it. She worries about him not taking anything for his heart failure and wonders if there is something else he could take. She stated that patient currently does not have any signs or symptoms of heart failure that are concerning, but does not want him to develop symptoms because he is not on any medication either. She stated that he has no swelling in his feet or ankles, no fluid noticed in his stomach, no complaints of chest discomfort and no shortness of breath.   They currently do not  have any additional questions or concerns.  Plan:  1) RNCM to reach out to heart failure clinic to discuss [name]'s concerns about him being off his metoprolol and to see if there is something else he should be taking that will not drop his blood pressure. She stated that if they need an appointment, they would be glad to go in. His BP medications have now been held since 09/21/2016.  2) RNCM to continue to follow patient for transition of care outreach with a call next week and patient's daughter/caregiver is agreeable.  [place] R. Jodi Kappes, RN, BSN, [place] Management Coordinator [phone]

## 2016-11-01 ENCOUNTER — Encounter (HOSPITAL_COMMUNITY): Payer: Self-pay | Admitting: Emergency Medicine

## 2016-11-01 ENCOUNTER — Emergency Department (HOSPITAL_COMMUNITY): Payer: Medicare Other

## 2016-11-01 ENCOUNTER — Emergency Department (HOSPITAL_COMMUNITY)
Admission: EM | Admit: 2016-11-01 | Discharge: 2016-11-01 | Disposition: A | Discharge status: Home / Self Care | Payer: Medicare Other | Attending: Emergency Medicine | Admitting: Emergency Medicine

## 2016-11-01 ENCOUNTER — Other Ambulatory Visit: Payer: Self-pay | Admitting: *Deleted

## 2016-11-01 DIAGNOSIS — Z87891 Personal history of nicotine dependence: Secondary | ICD-10-CM | POA: Diagnosis not present

## 2016-11-01 DIAGNOSIS — K9423 Gastrostomy malfunction: Secondary | ICD-10-CM | POA: Diagnosis not present

## 2016-11-01 DIAGNOSIS — Z8521 Personal history of malignant neoplasm of larynx: Secondary | ICD-10-CM | POA: Insufficient documentation

## 2016-11-01 DIAGNOSIS — Z79899 Other long term (current) drug therapy: Secondary | ICD-10-CM | POA: Insufficient documentation

## 2016-11-01 DIAGNOSIS — I110 Hypertensive heart disease with heart failure: Secondary | ICD-10-CM | POA: Insufficient documentation

## 2016-11-01 DIAGNOSIS — Z4682 Encounter for fitting and adjustment of non-vascular catheter: Secondary | ICD-10-CM | POA: Diagnosis not present

## 2016-11-01 DIAGNOSIS — Z431 Encounter for attention to gastrostomy: Secondary | ICD-10-CM | POA: Diagnosis not present

## 2016-11-01 DIAGNOSIS — I509 Heart failure, unspecified: Secondary | ICD-10-CM | POA: Insufficient documentation

## 2016-11-01 MED ORDER — IOPAMIDOL (ISOVUE-300) INJECTION 61%
30.0000 mL | Freq: Once | INTRAVENOUS | Status: AC | PRN
  Administered 2016-11-01: 30 mL via ORAL

## 2016-11-01 MED ORDER — IOPAMIDOL (ISOVUE-300) INJECTION 61%
INTRAVENOUS | Status: AC
  Filled 2016-11-01: qty 30

## 2016-11-01 NOTE — ED Triage Notes (Signed)
Pt states that his G tube fell out around 3am this morning and needs to be replaced. Alert and oriented.

## 2016-11-01 NOTE — ED Provider Notes (Signed)
[place] DEPT Provider Note   CSN: [id number] Arrival date & time: 11/01/16  1130     History   Chief Complaint Chief Complaint  Patient presents with  . G-tube came out    HPI [name] is a 66 y.o. male.  The history is provided by the patient. No language interpreter was used.   [name] is a 66 y.o. male who presents to the Emergency Department complaining of feeding tube came out. Over the last 2-1/2-3 years he has had a Mickey button. He has not used this often over the last few months but there is no plan to remove it due to his history of throat cancer.  He noticed that it came out at 3 AM this morning and comes in for replacement. He denies any fevers, vomiting, abdominal pain, chest pain, shortness of breath.  He had an 44fr 1.7 cm Mic-key button at home.   Past Medical History:  Diagnosis Date  . Alcohol abuse   . Arthritis   . CHF (congestive heart failure) ([place])    EF 45-50% 05/2012  . Cirrhosis of liver ([place])   . Complication of anesthesia    SUCCINYLCHOLINE ADVERSE REACTION (not an allergy) Following 1st attempt to place tracheotomy tube in 2011 at [facility]; patient went into "succinylcholine induced pulseless electrical activity secondary to probable hyperkalemia" and underwent chest compressions and placement of endotracheal tube with admission to medical ICU.  EXTREMELY HIGH SEVERE REACTION.   Marland Kitchen Hyperlipidemia   . Hypertension   . Hypothyroidism   . Laryngeal cancer ([place]) 2011   Laryngectomy, T3N0M0  . Seizures [facility])     Patient Active Problem List   Diagnosis Date Noted  . Status post total replacement of right hip 09/15/2016  . Avascular necrosis of hip, right ([place]) 08/09/2016  . Cellulitis 07/10/2016  . Hypokalemia 07/10/2016  . Right hip pain 07/10/2016  . Erysipelas 07/05/2016  . Orthostatic hypotension 08/13/2014  . Abdominal distention 08/13/2014  . Weakness 08/13/2014  . Cancer associated pain   . Altered mental  state   . Sepsis ([place]) 05/27/2014  . SIRS (systemic inflammatory response syndrome) (HCC)   . Fever   . AKI (acute kidney injury) ([place])   . Throat cancer ([place])   . Esophageal mass   . Weight loss   . Odynophagia   . Protein-calorie malnutrition, severe ([place]) 04/10/2014  . Syncope 04/09/2014  . LOC (loss of consciousness) ([place]) 04/09/2014  . Anorexia   . Pancreatitis, chronic ([place]) 05/20/2013  . Primary adrenal insufficiency ([place]) 05/20/2013  . Hyperlipidemia   . Osteoarthritis   . Hypotension 05/10/2013  . Dehydration 05/08/2013  . Loss of weight 03/12/2013  . Elevated serum creatinine 11/28/2012  . Chronic systolic heart failure ([place]) 01/04/2012  . Alcohol dependence ([place]) 01/04/2012  . Decreased appetite 01/03/2012  . Acute and chronic respiratory failure 12/26/2011  . 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 Hypothyroidism 10/16/2010  . Malignant neoplasm of larynx ([place]) 01/10/2010  . PULMONARY NODULE 12/31/2007  . Essential hypertension, benign 11/08/2007    Past Surgical History:  Procedure Laterality Date  . ESOPHAGOGASTRODUODENOSCOPY N/A 04/10/2014   Procedure: ESOPHAGOGASTRODUODENOSCOPY (EGD);  Surgeon: Missy Sabins, MD;  Location[facility] ENDOSCOPY;  Service: Endoscopy;  Laterality: N/A;  . LEFT AND RIGHT HEART CATHETERIZATION WITH CORONARY ANGIOGRAM N/A 12/28/2011   Procedure: LEFT AND RIGHT HEART CATHETERIZATION WITH CORONARY ANGIOGRAM;  Surgeon: Jolaine Artist, MD;  Location: [facility] CATH LAB;  Service: Cardiovascular;  Laterality: N/A;  . TOTAL HIP ARTHROPLASTY Right 09/15/2016   Procedure: RIGHT TOTAL HIP ARTHROPLASTY ANTERIOR APPROACH;  Surgeon: Mcarthur Rossetti, MD;  Location: [facility] ORS;  Service: Orthopedics;  Laterality: Right;  . TRACHEAL SURGERY    . TRACHEOSTOMY         Home Medications    Prior to Admission medications   Medication Sig Start Date End Date Taking? Authorizing Provider  Alum & Mag  Hydroxide-Simeth (MAGIC MOUTHWASH W/LIDOCAINE) SOLN Take 5 mLs by mouth 4 (four) times daily. 05/07/14   Frazier Richards, MD  amLODipine (NORVASC) 5 MG tablet TAKE 1 TABLET(5 MG) BY MOUTH DAILY 09/05/16   Elayne Snare, MD  aspirin EC 81 MG tablet Take 1 tablet (81 mg total) by mouth 2 (two) times daily after a meal. 09/17/16   Mcarthur Rossetti, MD  bisoprolol (ZEBETA) 10 MG tablet TAKE 1 TABLET(10 MG) BY MOUTH DAILY 06/03/16   Elayne Snare, MD  diclofenac sodium (VOLTAREN) 1 % GEL Apply 2 g topically 3 (three) times daily as needed (hip pain/back pain). 07/28/16   Smiley Houseman, MD  guaiFENesin (MUCINEX) 600 MG 12 hr tablet Take 1 tablet (600 mg total) by mouth 2 (two) times daily. 05/26/14   Kuneff, Renee A, DO  levothyroxine (SYNTHROID, LEVOTHROID) 112 MCG tablet TAKE 1 TABLET(112 MCG) BY MOUTH DAILY BEFORE BREAKFAST 07/07/16   Mercy Riding, MD  Menthol, Topical Analgesic, (BIOFREEZE) 4 % GEL Apply to affected area. 07/28/16   Smiley Houseman, MD  methocarbamol (ROBAXIN) 500 MG tablet Take 1 tablet (500 mg total) by mouth every 6 (six) hours as needed for muscle spasms. Patient not taking: Reported on 10/13/2016 09/17/16   Mcarthur Rossetti, MD  Nutritional Supplements (FEEDING SUPPLEMENT, JEVITY 1.5 CAL,) LIQD Place 70 mL/hr into feeding tube continuous.    [provider]  oxyCODONE-acetaminophen (PERCOCET/ROXICET) 5-325 MG tablet Take 1 tablet by mouth every 8 (eight) hours as needed for severe pain. 10/26/16   Mcarthur Rossetti, MD  Potassium Chloride ER 20 MEQ TBCR TAKE 1 TABLET BY MOUTH DAILY 09/05/16   Elayne Snare, MD  traMADol (ULTRAM) 50 MG tablet Take 2 tablets (100 mg total) by mouth every 6 (six) hours as needed. 09/28/16   Mcarthur Rossetti, MD    Family History Family History  Problem Relation Age of Onset  . Hyperlipidemia Mother   . Hypertension Mother   . Hypertension Father   . Hyperlipidemia Father     Social History Social History  Substance  Use Topics  . Smoking status: Former Smoker    Packs/day: 0.20    Years: 50.00    Types: Cigarettes  . Smokeless tobacco: Never Used  . Alcohol use Yes     Allergies   Succinylcholine; Tylenol [acetaminophen]; Vicodin [hydrocodone-acetaminophen]; and Other   Review of Systems Review of Systems  All other systems reviewed and are negative.    Physical Exam Updated Vital Signs BP (!) 161/102 (BP Location: Right Arm)   Pulse 88   Temp 97.8 F (36.6 C) (Oral)   Resp 18   SpO2 100%   Physical Exam  Constitutional: He is oriented to person, place, and time. He appears well-developed and well-nourished.  HENT:  Head: Normocephalic and atraumatic.  Neck:  Stoma in anterior neck  Cardiovascular: Normal rate and regular rhythm.   No murmur heard. Pulmonary/Chest: Effort normal and breath sounds normal. No respiratory distress.  Abdominal: Soft. There is no tenderness. There is no  rebound and no guarding.  Stoma in left upper quadrant without any surrounding erythema, induration or tenderness  Musculoskeletal: He exhibits no edema or tenderness.  Neurological: He is alert and oriented to person, place, and time.  Skin: Skin is warm and dry.  Psychiatric: He has a normal mood and affect. His behavior is normal.  Nursing note and vitals reviewed.    ED Treatments / Results  Labs (all labs ordered are listed, but only abnormal results are displayed) Labs Reviewed - No data to display  EKG  EKG Interpretation None       Radiology Dg Abdomen Peg Tube Location  Result Date: 11/01/2016 CLINICAL DATA:  G-tube replacement. EXAM: ABDOMEN - 1 VIEW COMPARISON:  None. FINDINGS: 25 cc of Isovue 300 were injected through the gastrostomy tube. There is normal opacification of the stomach and small bowel, consistent with appropriate gastrostomy tube placement. Stool is noted throughout the colon. There are a few borderline dilated air-filled loops of small bowel. Prior right total  hip arthroplasty. IMPRESSION: 1. Appropriate placement of the gastrostomy tube with normal opacification of stomach and small bowel after contrast administration. 2. Few borderline dilated air-filled loops of small bowel, nonspecific, possibly reflecting ileus. 3. Large amount of stool throughout the colon. Correlate for constipation. Electronically Signed   By: Titus Dubin M.D.   On: 11/01/2016 13:31    Procedures Procedures (including critical care time)  Replacement of gastrostomy tube. Verbal consent was obtained.lubricant jelly was placed the abdominal wall and an 18 French Foley catheter was placed and the balloon was inflated and secured. Foley catheter was placed until proper ostomy tube was available.When it Mic-key button was available at the Foley catheter balloon was deflated and the catheter was removed and an 18 [place], 2.5 cm Mickey button was recently abdominal wall. The balloon was inflated with 8 mL of air. Patient tolerated the procedure well. He had no pain following placement.  Medications Ordered in ED Medications  iopamidol (ISOVUE-300) 61 % injection 30 mL (30 mLs Oral Contrast Given 11/01/16 1321)     Initial Impression / Assessment and Plan / ED Course  I have reviewed the triage vital signs and the nursing notes.  Pertinent labs & imaging results that were available during my care of the patient were reviewed by me and considered in my medical decision making (see chart for details).     Patient here for violation after his G-tube came out. Tube was replaced without difficulty in the department per procedure note. The patient on home care, patient follow up and return precautions.    Final Clinical Impressions(s) / ED Diagnoses   Final diagnoses:  PEG (percutaneous endoscopic gastrostomy) adjustment/replacement/removal [facility])    New Prescriptions Discharge Medication List as of 11/01/2016  1:39 PM       Quintella Reichert, MD 11/01/16 1800

## 2016-11-01 NOTE — Patient Outreach (Signed)
[place] [facility] [facility]) Care Management  11/01/2016  [name] 01-30-1951 [id number]   CSW made a second attempt to try and contact patient's daughter, [name] today to follow-up regarding social work services and resources for patient, without success.  A HIPAA compliant message was left for Mrs. Snipes on voicemail.  CSW is currently awaiting a return call. CSW will make a third and final outreach attempt within the next week, if CSW does not receive a return call from patient in the meantime. Nat Christen, BSW, MSW, LCSW  Licensed Education officer, [facility]  Mailing [place] [address] [place], [place] [address] Physical Address-[address], [place], [place] [address] Toll Free Main # [phone] Fax # [phone] Cell # [phone]  Office # [phone] [email or web address]

## 2016-11-01 NOTE — Discharge Instructions (Signed)
You had your gastrostomy tube replaced today with an 18 french, 2.5 cm Mini One or Mic-key button.  Your x-ray today showed constipation.

## 2016-11-05 ENCOUNTER — Emergency Department (HOSPITAL_COMMUNITY)
Admission: EM | Admit: 2016-11-05 | Discharge: 2016-11-05 | Disposition: A | Discharge status: Home / Self Care | Payer: Medicare Other | Attending: Emergency Medicine | Admitting: Emergency Medicine

## 2016-11-05 ENCOUNTER — Emergency Department (HOSPITAL_COMMUNITY): Payer: Medicare Other

## 2016-11-05 ENCOUNTER — Encounter (HOSPITAL_COMMUNITY): Payer: Self-pay | Admitting: *Deleted

## 2016-11-05 DIAGNOSIS — Z79899 Other long term (current) drug therapy: Secondary | ICD-10-CM | POA: Diagnosis not present

## 2016-11-05 DIAGNOSIS — I509 Heart failure, unspecified: Secondary | ICD-10-CM | POA: Diagnosis not present

## 2016-11-05 DIAGNOSIS — I110 Hypertensive heart disease with heart failure: Secondary | ICD-10-CM | POA: Diagnosis not present

## 2016-11-05 DIAGNOSIS — Z96641 Presence of right artificial hip joint: Secondary | ICD-10-CM | POA: Diagnosis not present

## 2016-11-05 DIAGNOSIS — Z87891 Personal history of nicotine dependence: Secondary | ICD-10-CM | POA: Insufficient documentation

## 2016-11-05 DIAGNOSIS — R109 Unspecified abdominal pain: Secondary | ICD-10-CM | POA: Diagnosis present

## 2016-11-05 DIAGNOSIS — K9423 Gastrostomy malfunction: Secondary | ICD-10-CM

## 2016-11-05 DIAGNOSIS — E039 Hypothyroidism, unspecified: Secondary | ICD-10-CM | POA: Diagnosis not present

## 2016-11-05 DIAGNOSIS — Z7982 Long term (current) use of aspirin: Secondary | ICD-10-CM | POA: Insufficient documentation

## 2016-11-05 DIAGNOSIS — Z4682 Encounter for fitting and adjustment of non-vascular catheter: Secondary | ICD-10-CM | POA: Diagnosis not present

## 2016-11-05 MED ORDER — IOPAMIDOL (ISOVUE-300) INJECTION 61%
INTRAVENOUS | Status: AC
  Administered 2016-11-05: 30 mL via GASTROSTOMY
  Filled 2016-11-05: qty 30

## 2016-11-05 NOTE — ED Notes (Signed)
MD Tegeler opened EndoVive standard balloon replacement kit right angle bolster 18 Fr (6.28m), but was not the piece of equipment needed- not used. 18 Fr. 5cc ribbed balloon coude tip foley catheter tip was used to open peg tube hole. The tube that the pt came in with that came out was then put back in place by MD Tegeler.

## 2016-11-05 NOTE — ED Triage Notes (Signed)
Patient is alert and oriented x4.  He is being seen for G-tube replacement.  Patient family states that his current tube was placed last week and that it came out this morning.  Patient family states that this has not been an issue in the past.  Patient denies any pain.

## 2016-11-05 NOTE — Discharge Instructions (Signed)
We successfully replaced your G-tube today. Please follow-up with your primary care physician for further management.

## 2016-11-05 NOTE — ED Notes (Signed)
ED Provider at bedside. 

## 2016-11-05 NOTE — ED Provider Notes (Signed)
[facility] DEPT Provider Note   CSN: [id number] Arrival date & time: 11/05/16  0948     History   Chief Complaint Chief Complaint  Patient presents with  . Abdominal Pain    G TUBE replacement    HPI [name] is a 66 y.o. male.  The history is provided by the patient and medical records. No language interpreter was used.  Wound Check  This is a recurrent problem. The current episode started 6 to 12 hours ago. The problem occurs constantly. The problem has not changed since onset.Pertinent negatives include no chest pain, no abdominal pain, no headaches and no shortness of breath. Nothing aggravates the symptoms. Nothing relieves the symptoms. He has tried nothing for the symptoms. The treatment provided no relief.    Past Medical History:  Diagnosis Date  . Alcohol abuse   . Arthritis   . CHF (congestive heart failure) ([place])    EF 45-50% 05/2012  . Cirrhosis of liver ([place])   . Complication of anesthesia    SUCCINYLCHOLINE ADVERSE REACTION (not an allergy) Following 1st attempt to place tracheotomy tube in 2011 at [facility]; patient went into "succinylcholine induced pulseless electrical activity secondary to probable hyperkalemia" and underwent chest compressions and placement of endotracheal tube with admission to medical ICU.  EXTREMELY HIGH SEVERE REACTION.   Marland Kitchen Hyperlipidemia   . Hypertension   . Hypothyroidism   . Laryngeal cancer ([place]) 2011   Laryngectomy, T3N0M0  . Seizures [facility])     Patient Active Problem List   Diagnosis Date Noted  . Status post total replacement of right hip 09/15/2016  . Avascular necrosis of hip, right ([place]) 08/09/2016  . Cellulitis 07/10/2016  . Hypokalemia 07/10/2016  . Right hip pain 07/10/2016  . Erysipelas 07/05/2016  . Orthostatic hypotension 08/13/2014  . Abdominal distention 08/13/2014  . Weakness 08/13/2014  . Cancer associated pain   . Altered mental state   . Sepsis ([place]) 05/27/2014  . SIRS (systemic  inflammatory response syndrome) (HCC)   . Fever   . AKI (acute kidney injury) ([place])   . Throat cancer ([place])   . Esophageal mass   . Weight loss   . Odynophagia   . Protein-calorie malnutrition, severe ([place]) 04/10/2014  . Syncope 04/09/2014  . LOC (loss of consciousness) ([place]) 04/09/2014  . Anorexia   . Pancreatitis, chronic ([place]) 05/20/2013  . Primary adrenal insufficiency ([place]) 05/20/2013  . Hyperlipidemia   . Osteoarthritis   . Hypotension 05/10/2013  . Dehydration 05/08/2013  . Loss of weight 03/12/2013  . Elevated serum creatinine 11/28/2012  . Chronic systolic heart failure ([place]) 01/04/2012  . Alcohol dependence ([place]) 01/04/2012  . Decreased appetite 01/03/2012  . Acute and chronic respiratory failure 12/26/2011  . 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 Hypothyroidism 10/16/2010  . Malignant neoplasm of larynx ([place]) 01/10/2010  . PULMONARY NODULE 12/31/2007  . Essential hypertension, benign 11/08/2007    Past Surgical History:  Procedure Laterality Date  . ESOPHAGOGASTRODUODENOSCOPY N/A 04/10/2014   Procedure: ESOPHAGOGASTRODUODENOSCOPY (EGD);  Surgeon: Missy Sabins, MD;  Location: [facility] ENDOSCOPY;  Service: Endoscopy;  Laterality: N/A;  . LEFT AND RIGHT HEART CATHETERIZATION WITH CORONARY ANGIOGRAM N/A 12/28/2011   Procedure: LEFT AND RIGHT HEART CATHETERIZATION WITH CORONARY ANGIOGRAM;  Surgeon: Jolaine Artist, MD;  Location: [facility] CATH LAB;  Service: Cardiovascular;  Laterality: N/A;  . TOTAL HIP ARTHROPLASTY Right 09/15/2016   Procedure: RIGHT TOTAL HIP ARTHROPLASTY ANTERIOR APPROACH;  Surgeon: Jean Rosenthal  Y, MD;  Location: [facility] ORS;  Service: Orthopedics;  Laterality: Right;  . TRACHEAL SURGERY    . TRACHEOSTOMY         Home Medications    Prior to Admission medications   Medication Sig Start Date End Date Taking? Authorizing Provider  Alum & Mag Hydroxide-Simeth (MAGIC MOUTHWASH W/LIDOCAINE) SOLN Take  5 mLs by mouth 4 (four) times daily. 05/07/14   Frazier Richards, MD  amLODipine (NORVASC) 5 MG tablet TAKE 1 TABLET(5 MG) BY MOUTH DAILY 09/05/16   Elayne Snare, MD  aspirin EC 81 MG tablet Take 1 tablet (81 mg total) by mouth 2 (two) times daily after a meal. 09/17/16   Mcarthur Rossetti, MD  bisoprolol (ZEBETA) 10 MG tablet TAKE 1 TABLET(10 MG) BY MOUTH DAILY 06/03/16   Elayne Snare, MD  diclofenac sodium (VOLTAREN) 1 % GEL Apply 2 g topically 3 (three) times daily as needed (hip pain/back pain). 07/28/16   Smiley Houseman, MD  guaiFENesin (MUCINEX) 600 MG 12 hr tablet Take 1 tablet (600 mg total) by mouth 2 (two) times daily. 05/26/14   Kuneff, Renee A, DO  levothyroxine (SYNTHROID, LEVOTHROID) 112 MCG tablet TAKE 1 TABLET(112 MCG) BY MOUTH DAILY BEFORE BREAKFAST 07/07/16   Mercy Riding, MD  Menthol, Topical Analgesic, (BIOFREEZE) 4 % GEL Apply to affected area. 07/28/16   Smiley Houseman, MD  methocarbamol (ROBAXIN) 500 MG tablet Take 1 tablet (500 mg total) by mouth every 6 (six) hours as needed for muscle spasms. Patient not taking: Reported on 10/13/2016 09/17/16   Mcarthur Rossetti, MD  Nutritional Supplements (FEEDING SUPPLEMENT, JEVITY 1.5 CAL,) LIQD Place 70 mL/hr into feeding tube continuous.    [provider]  oxyCODONE-acetaminophen (PERCOCET/ROXICET) 5-325 MG tablet Take 1 tablet by mouth every 8 (eight) hours as needed for severe pain. 10/26/16   Mcarthur Rossetti, MD  Potassium Chloride ER 20 MEQ TBCR TAKE 1 TABLET BY MOUTH DAILY 09/05/16   Elayne Snare, MD  traMADol (ULTRAM) 50 MG tablet Take 2 tablets (100 mg total) by mouth every 6 (six) hours as needed. 09/28/16   Mcarthur Rossetti, MD    Family History Family History  Problem Relation Age of Onset  . Hyperlipidemia Mother   . Hypertension Mother   . Hypertension Father   . Hyperlipidemia Father     Social History Social History  Substance Use Topics  . Smoking status: Former Smoker     Packs/day: 0.20    Years: 50.00    Types: Cigarettes  . Smokeless tobacco: Never Used  . Alcohol use Yes     Allergies   Succinylcholine; Tylenol [acetaminophen]; Vicodin [hydrocodone-acetaminophen]; and Other   Review of Systems Review of Systems  Constitutional: Negative for chills and fatigue.  HENT: Negative for congestion.   Respiratory: Negative for chest tightness and shortness of breath.   Cardiovascular: Negative for chest pain.  Gastrointestinal: Negative for abdominal pain, diarrhea and vomiting.  Genitourinary: Negative for flank pain.  Musculoskeletal: Negative for gait problem and neck pain.  Skin: Positive for wound (g tube site). Negative for rash.  Neurological: Negative for headaches.  Psychiatric/Behavioral: Negative for agitation.  All other systems reviewed and are negative.    Physical Exam Updated Vital Signs BP (!) 156/113 (BP Location: Left Arm)   Pulse 84   Temp 97.8 F (36.6 C) (Oral)   Resp 18   Ht 5' 11.5" (1.816 m)   Wt 58.1 kg (128 lb)   SpO2 99%  BMI 17.60 kg/m   Physical Exam  HENT:  Head: Normocephalic.  Nose: Nose normal.  Eyes: Pupils are equal, round, and reactive to light. Conjunctivae are normal.  Cardiovascular: Normal rate.   No murmur heard. Pulmonary/Chest: Effort normal. No stridor. He exhibits no tenderness.  Abdominal: Soft. There is no tenderness.  g tube out  Musculoskeletal: He exhibits no tenderness.  Skin: Capillary refill takes less than 2 seconds. No erythema. No pallor.  Psychiatric: He has a normal mood and affect.  Nursing note and vitals reviewed.    ED Treatments / Results  Labs (all labs ordered are listed, but only abnormal results are displayed) Labs Reviewed - No data to display  EKG  EKG Interpretation None       Radiology No results found.  Procedures Gastrostomy tube replacement Date/Time: 11/06/2016 2:07 PM Performed by: Courtney Paris Authorized by: Courtney Paris  Consent: Verbal consent obtained. Risks and benefits: risks, benefits and alternatives were discussed Consent given by: patient Patient understanding: patient states understanding of the procedure being performed Patient identity confirmed: verbally with patient and hospital-assigned identification number Time out: Immediately prior to procedure a "time out" was called to verify the correct patient, procedure, equipment, support staff and site/side marked as required. Preparation: Patient was prepped and draped in the usual sterile fashion. Local anesthesia used: no  Anesthesia: Local anesthesia used: no  Sedation: Patient sedated: no Patient tolerance: Patient tolerated the procedure well with no immediate complications    (including critical care time)  Medications Ordered in ED Medications - No data to display   Initial Impression / Assessment and Plan / ED Course  I have reviewed the triage vital signs and the nursing notes.  Pertinent labs & imaging results that were available during my care of the patient were reviewed by me and considered in my medical decision making (see chart for details).     [name] is a 66 y.o. male With a history of laryngeal neoplasm with G-tube placement, hypertension, thyroid disease, and hyperlipidemia who presents with G-tube problem. Patient reports that overnight, he woke up and his G-tube had fallen on the bed. Patient came to the ED for replacement and evaluation. He denies other complaint. Next  On exam, patient has a hole in his abdomen or his G-tube is normally placed that does not appear erythematous, tender, or otherwise abnormal. Patient had normal bowel sounds. Lungs clear. No other abnormal areas on exam.  A Foley catheter was placed to maintain the tract of the G-tube. Patient then had his G-tube replaced successfully the bedside. Post placement x-rays were ordered and it showed normal replacement and normal  function. G-tube was flushed and her back without difficulty.  Given successful replacement and resolution of problem, patient discharged home in good condition. Patient instructed to continue his normal outpatient management regimen. Patient understood return precautions for any new or worsened symptoms. Patient discharged in good condition.   Final Clinical Impressions(s) / ED Diagnoses   Final diagnoses:  PEG tube malfunction (HCC)   Clinical Impression: 1. PEG tube malfunction (HCC)     Disposition: Discharge  Condition: Good  I have discussed the results, Dx and Tx plan with the pt(& family if present). He/she/they expressed understanding and agree(s) with the plan. Discharge instructions discussed at great length. Strict return precautions discussed and pt &/or family have verbalized understanding of the instructions. No further questions at time of discharge.    Discharge Medication List as of  11/05/2016  3:56 PM      Follow Up: [facility] [address] [place] [address] [phone] Schedule an appointment as soon as possible for a visit    [facility] DEPT [place] [id number] [place] [phone]  If symptoms worsen     Mayling Aber, Gwenyth Allegra, MD 11/06/16 1407

## 2016-11-05 NOTE — ED Notes (Signed)
XR at bedside

## 2016-11-07 ENCOUNTER — Other Ambulatory Visit: Payer: Self-pay | Admitting: *Deleted

## 2016-11-07 ENCOUNTER — Ambulatory Visit: Payer: Medicare Other

## 2016-11-07 NOTE — Patient Outreach (Signed)
[facility]) Care Management  11/07/2016  [name] [birth year] [id number]   CSW was able to make contact with patient's daughter, [name] today to follow-up regarding social work services and resources for patient, as well as follow-up after patient's two recent hospital admissions.  Mrs. Snipes admits that patient is doing much better; although his blood pressure continues to run a little low.  Mrs. Snipes indicated that she has already reported this to patient's Primary Care Physician, Dr. Dennie Fetters.  Patient needed to be hospitalized because his feeding tube came out, but Mrs. Snipes reported that it has since been re-inserted and patient is doing well.  Mrs. Snipes admitted that she is still looking into private agency sitter services for patient, but was most appreciative of the list of private agency sitters provided to her by CSW.  Mrs. Snipes denied needing assistance with obtaining a sitter, agreeing to contact agencies on the list. CSW will perform a case closure on patient, as all goals of treatment have been met from social work standpoint and no additional social work needs have been identified at this time.  CSW will notify patient's RNCM with [facility], Tish Men of CSW's plans to close patient's case.  CSW will fax an update to patient's Primary Care Physician, Dr. Dennie Fetters to ensure that they are aware of CSW's involvement with patient's plan of care.  CSW will submit a case closure request to Verlon Setting, Care Management Assistant with [facility], in the form of an [employer].  CSW will ensure that Mrs. Comer is aware of Mrs. Satterfield's, RNCM with [employer], continued involvement with patient's care. Nat Christen, BSW, MSW, LCSW  Licensed Education officer, [facility]   Mailing [place] [address], [place], [place] [address] Physical Address-[address], [place], [place] [address] Toll Free Main # [phone] Fax # [phone] Cell # [phone]  Office # [phone] [email or web address]

## 2016-11-10 ENCOUNTER — Ambulatory Visit: Payer: Self-pay | Admitting: *Deleted

## 2016-11-10 ENCOUNTER — Other Ambulatory Visit: Payer: Self-pay

## 2016-11-10 NOTE — Patient Outreach (Signed)
[facility]) Care Management  11/10/16  HITOSHI WERTS 1950/04/14 [id number]  Successful outreach completed with patient's daughter, [name]. Patient information verified.  Patient has had 2 ER visits recently related to his feeding tube, each time requiring new placement. [name] daughter is concerned about infection and stated she is monitoring it and keeping a close eye out. Stated she knows signs and symptoms to report and the first early sign of infection, she will call patient's doctor.  Plan: RNCM to complete home visit next week to look at feeding tube/site and monitor for signs/symptoms of infection.  [place] R. Kreg Earhart, RN, BSN, [place] [facility] Coordinator [phone]

## 2016-11-14 ENCOUNTER — Ambulatory Visit: Payer: Self-pay

## 2016-11-25 ENCOUNTER — Other Ambulatory Visit: Payer: Self-pay | Admitting: Endocrinology

## 2016-11-26 ENCOUNTER — Other Ambulatory Visit: Payer: Self-pay | Admitting: Endocrinology

## 2016-11-30 ENCOUNTER — Emergency Department (HOSPITAL_COMMUNITY)
Admission: EM | Admit: 2016-11-30 | Discharge: 2016-11-30 | Disposition: A | Discharge status: Home / Self Care | Payer: Medicare Other | Attending: Emergency Medicine | Admitting: Emergency Medicine

## 2016-11-30 ENCOUNTER — Encounter (HOSPITAL_COMMUNITY): Payer: Self-pay

## 2016-11-30 DIAGNOSIS — E039 Hypothyroidism, unspecified: Secondary | ICD-10-CM | POA: Insufficient documentation

## 2016-11-30 DIAGNOSIS — Z96641 Presence of right artificial hip joint: Secondary | ICD-10-CM | POA: Insufficient documentation

## 2016-11-30 DIAGNOSIS — K9420 Gastrostomy complication, unspecified: Secondary | ICD-10-CM | POA: Diagnosis present

## 2016-11-30 DIAGNOSIS — K9423 Gastrostomy malfunction: Secondary | ICD-10-CM | POA: Diagnosis not present

## 2016-11-30 DIAGNOSIS — I509 Heart failure, unspecified: Secondary | ICD-10-CM | POA: Diagnosis not present

## 2016-11-30 DIAGNOSIS — I110 Hypertensive heart disease with heart failure: Secondary | ICD-10-CM | POA: Diagnosis not present

## 2016-11-30 DIAGNOSIS — Z7982 Long term (current) use of aspirin: Secondary | ICD-10-CM | POA: Insufficient documentation

## 2016-11-30 DIAGNOSIS — Z87891 Personal history of nicotine dependence: Secondary | ICD-10-CM | POA: Diagnosis not present

## 2016-11-30 DIAGNOSIS — Z79899 Other long term (current) drug therapy: Secondary | ICD-10-CM | POA: Insufficient documentation

## 2016-11-30 NOTE — ED Triage Notes (Signed)
Pt reports g-tube came out around 1300 today after it started itching. Pt did not attempt to put back in place. Hx of it coming out previously on 11/05/16. A/Ox4, ambulatory, denies any other complaintes

## 2016-11-30 NOTE — ED Provider Notes (Signed)
[place] DEPT Provider Note   CSN: [id number] Arrival date & time: 11/30/16  1338     History   Chief Complaint Chief Complaint  Patient presents with  . GI Problem    HPI [name] is a 66 y.o. male.  Patient had a gastrostomy tube placed approximately 2 years ago for feeding issues related to surgery for laryngeal cancer. He has been eating by mouth for multiple months. The tube came out recently. He is here today for possible replacement of the tube. No vomiting or diarrhea or fever.      Past Medical History:  Diagnosis Date  . Alcohol abuse   . Arthritis   . CHF (congestive heart failure) ([place])    EF 45-50% 05/2012  . Cirrhosis of liver ([place])   . Complication of anesthesia    SUCCINYLCHOLINE ADVERSE REACTION (not an allergy) Following 1st attempt to place tracheotomy tube in 2011 at [facility]; patient went into "succinylcholine induced pulseless electrical activity secondary to probable hyperkalemia" and underwent chest compressions and placement of endotracheal tube with admission to medical ICU.  EXTREMELY HIGH SEVERE REACTION.   Marland Kitchen Hyperlipidemia   . Hypertension   . Hypothyroidism   . Laryngeal cancer ([place]) 2011   Laryngectomy, T3N0M0  . Seizures [facility])     Patient Active Problem List   Diagnosis Date Noted  . Status post total replacement of right hip 09/15/2016  . Avascular necrosis of hip, right ([place]) 08/09/2016  . Cellulitis 07/10/2016  . Hypokalemia 07/10/2016  . Right hip pain 07/10/2016  . Erysipelas 07/05/2016  . Orthostatic hypotension 08/13/2014  . Abdominal distention 08/13/2014  . Weakness 08/13/2014  . Cancer associated pain   . Altered mental state   . Sepsis ([place]) 05/27/2014  . SIRS (systemic inflammatory response syndrome) (HCC)   . Fever   . AKI (acute kidney injury) ([place])   . Throat cancer ([place])   . Esophageal mass   . Weight loss   . Odynophagia   . Protein-calorie malnutrition, severe ([place]) 04/10/2014  .  Syncope 04/09/2014  . LOC (loss of consciousness) ([place]) 04/09/2014  . Anorexia   . Pancreatitis, chronic ([place]) 05/20/2013  . Primary adrenal insufficiency ([place]) 05/20/2013  . Hyperlipidemia   . Osteoarthritis   . Hypotension 05/10/2013  . Dehydration 05/08/2013  . Loss of weight 03/12/2013  . Elevated serum creatinine 11/28/2012  . Chronic systolic heart failure ([place]) 01/04/2012  . Alcohol dependence ([place]) 01/04/2012  . Decreased appetite 01/03/2012  . Acute and chronic respiratory failure 12/26/2011  . 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 Hypothyroidism 10/16/2010  . Malignant neoplasm of larynx ([place]) 01/10/2010  . PULMONARY NODULE 12/31/2007  . Essential hypertension, benign 11/08/2007    Past Surgical History:  Procedure Laterality Date  . ESOPHAGOGASTRODUODENOSCOPY N/A 04/10/2014   Procedure: ESOPHAGOGASTRODUODENOSCOPY (EGD);  Surgeon: Missy Sabins, MD;  [facility] ENDOSCOPY;  Service: Endoscopy;  Laterality: N/A;  . LEFT AND RIGHT HEART CATHETERIZATION WITH CORONARY ANGIOGRAM N/A 12/28/2011   Procedure: LEFT AND RIGHT HEART CATHETERIZATION WITH CORONARY ANGIOGRAM;  Surgeon: Jolaine Artist, MD;  Location: [facility] CATH LAB;  Service: Cardiovascular;  Laterality: N/A;  . TOTAL HIP ARTHROPLASTY Right 09/15/2016   Procedure: RIGHT TOTAL HIP ARTHROPLASTY ANTERIOR APPROACH;  Surgeon: Mcarthur Rossetti, MD;  Location: [facility] ORS;  Service: Orthopedics;  Laterality: Right;  . TRACHEAL SURGERY    . TRACHEOSTOMY         Home Medications  Prior to Admission medications   Medication Sig Start Date End Date Taking? Authorizing Provider  Alum & Mag Hydroxide-Simeth (MAGIC MOUTHWASH W/LIDOCAINE) SOLN Take 5 mLs by mouth 4 (four) times daily. 05/07/14   Frazier Richards, MD  amLODipine (NORVASC) 5 MG tablet TAKE 1 TABLET(5 MG) BY MOUTH DAILY 09/05/16   Elayne Snare, MD  aspirin EC 81 MG tablet Take 1 tablet (81 mg total) by mouth 2  (two) times daily after a meal. 09/17/16   Mcarthur Rossetti, MD  bisoprolol (ZEBETA) 10 MG tablet TAKE 1 TABLET(10 MG) BY MOUTH DAILY 06/03/16   Elayne Snare, MD  diclofenac sodium (VOLTAREN) 1 % GEL Apply 2 g topically 3 (three) times daily as needed (hip pain/back pain). 07/28/16   Smiley Houseman, MD  guaiFENesin (MUCINEX) 600 MG 12 hr tablet Take 1 tablet (600 mg total) by mouth 2 (two) times daily. 05/26/14   Kuneff, Renee A, DO  levothyroxine (SYNTHROID, LEVOTHROID) 112 MCG tablet TAKE 1 TABLET(112 MCG) BY MOUTH DAILY BEFORE BREAKFAST 07/07/16   Mercy Riding, MD  Menthol, Topical Analgesic, (BIOFREEZE) 4 % GEL Apply to affected area. 07/28/16   Smiley Houseman, MD  methocarbamol (ROBAXIN) 500 MG tablet Take 1 tablet (500 mg total) by mouth every 6 (six) hours as needed for muscle spasms. Patient not taking: Reported on 10/13/2016 09/17/16   Mcarthur Rossetti, MD  Nutritional Supplements (FEEDING SUPPLEMENT, JEVITY 1.5 CAL,) LIQD Place 70 mL/hr into feeding tube continuous.    [provider]  oxyCODONE-acetaminophen (PERCOCET/ROXICET) 5-325 MG tablet Take 1 tablet by mouth every 8 (eight) hours as needed for severe pain. 10/26/16   Mcarthur Rossetti, MD  Potassium Chloride ER 20 MEQ TBCR TAKE 1 TABLET BY MOUTH DAILY 09/05/16   Elayne Snare, MD  traMADol (ULTRAM) 50 MG tablet Take 2 tablets (100 mg total) by mouth every 6 (six) hours as needed. 09/28/16   Mcarthur Rossetti, MD    Family History Family History  Problem Relation Age of Onset  . Hyperlipidemia Mother   . Hypertension Mother   . Hypertension Father   . Hyperlipidemia Father     Social History Social History  Substance Use Topics  . Smoking status: Former Smoker    Packs/day: 0.20    Years: 50.00    Types: Cigarettes  . Smokeless tobacco: Never Used  . Alcohol use Yes     Allergies   Succinylcholine; Tylenol [acetaminophen]; Vicodin [hydrocodone-acetaminophen]; and Other   Review  of Systems Review of Systems  All other systems reviewed and are negative.    Physical Exam Updated Vital Signs BP (!) 175/122 (BP Location: Left Arm) Comment: pt reports hx of htn, but PCP took pt off   Pulse 82   Temp 98.6 F (37 C) (Oral)   Resp 14   Ht 5\' 11"  (1.803 m)   Wt 59.9 kg (132 lb)   SpO2 97%   BMI 18.41 kg/m   Physical Exam  Constitutional: He is oriented to person, place, and time. He appears well-developed and well-nourished.  HENT:  Head: Normocephalic and atraumatic.  Eyes: Conjunctivae are normal.  Neck: Neck supple.  Cardiovascular: Normal rate and regular rhythm.   Pulmonary/Chest: Effort normal and breath sounds normal.  Abdominal:  G-tube out.  Musculoskeletal: Normal range of motion.  Neurological: He is alert and oriented to person, place, and time.  Skin: Skin is warm and dry.  Psychiatric: He has a normal mood and affect. His behavior is normal.  Nursing note and vitals reviewed.    ED Treatments / Results  Labs (all labs ordered are listed, but only abnormal results are displayed) Labs Reviewed - No data to display  EKG  EKG Interpretation None       Radiology No results found.  Procedures Procedures (including critical care time)  Medications Ordered in ED Medications - No data to display   Initial Impression / Assessment and Plan / ED Course  I have reviewed the triage vital signs and the nursing notes.  Pertinent labs & imaging results that were available during my care of the patient were reviewed by me and considered in my medical decision making (see chart for details).     I discussed the clinical scenario with the gastroenterologist on call. Recommend not replacing the G-tube at this time.  Final Clinical Impressions(s) / ED Diagnoses   Final diagnoses:  Gastrostomy tube dysfunction [facility])    New Prescriptions New Prescriptions   No medications on file     Nat Christen, MD 11/30/16 [phone]

## 2016-11-30 NOTE — Discharge Instructions (Signed)
I discussed your case with the gastroenterology doctor on-call. There is no need to replace your feeding team at this time.  Keep dressing on your abdominal wound as needed.

## 2016-11-30 NOTE — ED Notes (Signed)
Bed: WTR6 Expected date:  Expected time:  Means of arrival:  Comments: 

## 2016-12-05 ENCOUNTER — Other Ambulatory Visit: Payer: Self-pay | Admitting: Pharmacist

## 2016-12-05 NOTE — Patient Outreach (Signed)
[facility]) Care Management  12/05/2016  RAEF SPRIGG 1950-03-25 [id number]   Called patient's daughter to follow up on open enrollment with Medicare Part D.  [place] phone number was left on her voice mail along with a HIPAA compliant message.  Plan:   Reach out to the patient in 7-10 business days.   Elayne Guerin, PharmD, [place] Clinical Pharmacist [phone]

## 2016-12-19 ENCOUNTER — Other Ambulatory Visit: Payer: Self-pay | Admitting: Pharmacist

## 2016-12-19 NOTE — Patient Outreach (Signed)
[facility]) Care Management  [place]   12/19/2016  AMOGH KOMATSU 08/19/1950 [id number]  Subjective: [name] daughter was called to follow up on medication assistance and Medicare enrollment for 2019.  HIPAA identifiers were obtained. Patient is a 66 year old male with multiple medical conditions including but not limited to:  History of laryngeal cancer, CHF, hypertension, hyperlipidemia, alcohol dependence, and osteoarthritis.    [name] daughter reported her father manages his own medications under her supervision. She said he was not experiencing any side effects from his medications or have any issues. He is still unable to afford Biofreeze. (Biofreeze is OTC).  Objective:   Encounter Medications: Outpatient Encounter Prescriptions as of 12/19/2016  Medication Sig Note  . Alum & Mag Hydroxide-Simeth (MAGIC MOUTHWASH W/LIDOCAINE) SOLN Take 5 mLs by mouth 4 (four) times daily. 10/13/2016: PRN-ONLY  . amLODipine (NORVASC) 5 MG tablet TAKE 1 TABLET(5 MG) BY MOUTH DAILY   . aspirin EC 81 MG tablet Take 1 tablet (81 mg total) by mouth 2 (two) times daily after a meal.   . bisoprolol (ZEBETA) 10 MG tablet TAKE 1 TABLET(10 MG) BY MOUTH DAILY   . diclofenac sodium (VOLTAREN) 1 % GEL Apply 2 g topically 3 (three) times daily as needed (hip pain/back pain).   Marland Kitchen guaiFENesin (MUCINEX) 600 MG 12 hr tablet Take 1 tablet (600 mg total) by mouth 2 (two) times daily.   Marland Kitchen levothyroxine (SYNTHROID, LEVOTHROID) 112 MCG tablet TAKE 1 TABLET(112 MCG) BY MOUTH DAILY BEFORE BREAKFAST   . Potassium Chloride ER 20 MEQ TBCR TAKE 1 TABLET BY MOUTH DAILY   . traMADol (ULTRAM) 50 MG tablet Take 2 tablets (100 mg total) by mouth every 6 (six) hours as needed.   . Menthol, Topical Analgesic, (BIOFREEZE) 4 % GEL Apply to affected area. (Patient not taking: Reported on 12/19/2016) 12/19/2016: Not taking due to cost  . methocarbamol (ROBAXIN) 500 MG tablet Take 1 tablet (500 mg total) by  mouth every 6 (six) hours as needed for muscle spasms. (Patient not taking: Reported on 12/19/2016) 12/19/2016: Pharmacy did not have this in stock  . Nutritional Supplements (FEEDING SUPPLEMENT, JEVITY 1.5 CAL,) LIQD Place 70 mL/hr into feeding tube continuous. 07/05/2016: Pt has not needed recently. Only gets when he loses appetite.   . [DISCONTINUED] oxyCODONE-acetaminophen (PERCOCET/ROXICET) 5-325 MG tablet Take 1 tablet by mouth every 8 (eight) hours as needed for severe pain. (Patient not taking: Reported on 12/19/2016)    No facility-administered encounter medications on file as of 12/19/2016.     Functional Status: In your present state of health, do you have any difficulty performing the following activities: 10/09/2016 09/15/2016  Hearing? N N  Vision? N N  Difficulty concentrating or making decisions? N N  Walking or climbing stairs? Y N  Dressing or bathing? N N  Doing errands, shopping? Y N  Preparing Food and eating ? Y -  Using the Toilet? N -  In the past six months, have you accidently leaked urine? Y -  Do you have problems with loss of bowel control? N -  Managing your Medications? Y -  Managing your Finances? Y -  Housekeeping or managing your Housekeeping? Y -  Some recent data might be hidden    Fall/Depression Screening: Fall Risk  10/09/2016 10/06/2016 09/21/2016  Falls in the past year? Yes Yes No  Number falls in past yr: 2 or more 2 or more -  Injury with Fall? No No -  Risk Factor  Category  High Fall Risk High Fall Risk -  Risk for fall due to : History of fall(s);Impaired balance/gait;Impaired mobility;Medication side effect History of fall(s);Impaired balance/gait;Impaired mobility;Medication side effect -  Follow up Education provided;Falls prevention discussed Falls evaluation completed -   PHQ 2/9 Scores 10/09/2016 10/06/2016 10/06/2016 09/21/2016 07/28/2016 07/10/2016 09/23/2014  PHQ - 2 Score 0 - - 0 0 0 0  Exception Documentation Medical reason Medical reason  Medical reason - - - -  Not completed Patient has a tracheostomy; information obtained from daughter [name]. - Patient has a tracheostomy, unable to talk on the phone.  - - - -      Assessment/Interventions  [name] medications were reviewed via telephone.  Extra Help Application was completed for the patient.  [name] daughter was provided the [facility] contact information.  [name] daughter was instructed how to set up the patient an account on Medicare.gov    Plan: Check back with the patient's daughter to see if they have heard back from Social Security about Extra Help/LIS.   Elayne Guerin, PharmD, [place] Clinical Pharmacist [phone]

## 2017-01-04 DIAGNOSIS — M879 Osteonecrosis, unspecified: Secondary | ICD-10-CM | POA: Diagnosis not present

## 2017-01-04 DIAGNOSIS — H6042 Cholesteatoma of left external ear: Secondary | ICD-10-CM | POA: Diagnosis not present

## 2017-01-04 DIAGNOSIS — H906 Mixed conductive and sensorineural hearing loss, bilateral: Secondary | ICD-10-CM | POA: Diagnosis not present

## 2017-01-09 ENCOUNTER — Other Ambulatory Visit: Payer: Self-pay | Admitting: Pharmacist

## 2017-01-09 NOTE — Patient Outreach (Signed)
[facility]) Care Management  01/09/2017  [name] 09/04/1950 [id number]  Called patient's daughter to follow up on the "Extra Help" application that was completed last month. Unfortunately, she did not answer the phone.  HIPAA compliant message was left on the voice mail.  Plan:  Call patient back in 10-14 business days.   Elayne Guerin, PharmD, [place] Clinical Pharmacist [phone]

## 2017-01-11 ENCOUNTER — Encounter: Payer: Self-pay | Admitting: Family Medicine

## 2017-01-11 ENCOUNTER — Ambulatory Visit (INDEPENDENT_AMBULATORY_CARE_PROVIDER_SITE_OTHER): Payer: Medicare Other | Admitting: Family Medicine

## 2017-01-11 VITALS — BP 150/112 | HR 88 | Temp 98.0°F | Ht 69.0 in | Wt 124.1 lb

## 2017-01-11 DIAGNOSIS — Z23 Encounter for immunization: Secondary | ICD-10-CM

## 2017-01-11 DIAGNOSIS — Z5189 Encounter for other specified aftercare: Secondary | ICD-10-CM

## 2017-01-11 NOTE — Progress Notes (Signed)
Subjective:    [name], male    DOB: 1950-09-22, 66 y.o.   MRN: [id number]  No chief complaint on file. Pmh sig for HTN, malignant neoplasm of larynx, chronic pancreatitis, EtOH dependence, HLD, cirrhosis.  HPI Patient was seen today for wound check.  Pt is accompanied by his daughter.    G tube site concern: -pt had a G tube placed 2.5 yrs ago for feeding issues 2/2 laryngeal cancer. -the tube came out, was replaced with a foley. -the foley came out, was replaced with a mickey button (that was too small) which subsequently came out and was not replaced (11/30/16). -Pt currently able to take po. -Daughter expresses concern about the amount of drainage coming from site.  She "just wanted to make sure it wasn't infected" -Pt denies N/V, fever, chills, abd pain, erythema or purulent drainage at site.   -Daughter contacted [facility] agency) but no one ever came to the house to check on the site.   Pt has an appt with PCP tomorrow.  Pt's daughter states she made the appointment tomorrow so pt's bp meds could be adjusted as he is low in the am and higher in the evening.  H/o suspected primary adrenal insufficiency.  Per daughter, pt's surgeon is aware of tube displacement.    Past Medical History:  Diagnosis Date  . Alcohol abuse   . Arthritis   . CHF (congestive heart failure) ([place])    EF 45-50% 05/2012  . Cirrhosis of liver ([place])   . Complication of anesthesia    SUCCINYLCHOLINE ADVERSE REACTION (not an allergy) Following 1st attempt to place tracheotomy tube in 2011 at [facility]; patient went into "succinylcholine induced pulseless electrical activity secondary to probable hyperkalemia" and underwent chest compressions and placement of endotracheal tube with admission to medical ICU.  EXTREMELY HIGH SEVERE REACTION.   Marland Kitchen Hyperlipidemia   . Hypertension   . Hypothyroidism   . Laryngeal cancer ([place]) 2011   Laryngectomy, T3N0M0  . Seizures (HCC)      Allergies  Allergen Reactions  . Succinylcholine Other (See Comments)    ADVERSE REACTION (not an allergy) Following 1st attempt to place tracheotomy tube in 2011 at [facility]; patient went into "succinylcholine induced pulseless electrical activity secondary to probable hyperkalemia" and underwent chest compressions and placement of endotracheal tube with admission to medical ICU.  EXTREMELY HIGH SEVERE REACTION.   Marland Kitchen Tylenol [Acetaminophen] Other (See Comments) and Hypertension    HBP -Liver Cirrhosis  . Vicodin [Hydrocodone-Acetaminophen] Rash  . Other     Unknown anesthesia medicine caused cardiac arrest.    ROS General: Denies fever, chills, night sweats, changes in weight, changes in appetite HEENT: Denies headaches, ear pain, changes in vision, rhinorrhea, sore throat CV: Denies CP, palpitations, SOB, orthopnea Pulm: Denies SOB, cough, wheezing GI: Denies abdominal pain, nausea, vomiting, diarrhea, constipation   +concern about former g-tube site, some drainage GU: Denies dysuria, hematuria, frequency, vaginal discharge Msk: Denies muscle cramps, joint pains Neuro: Denies weakness, numbness, tingling Skin: Denies rashes, bruising Psych: Denies depression, anxiety, hallucinations     Objective:    Blood pressure (!) 150/112, pulse 88, temperature 98 F (36.7 C), temperature source Oral, height 5\' 9"  (1.753 m), weight 124 lb 1.6 oz (56.3 kg).   Gen. Pleasant, well-nourished, in no distress, normal affect  HEENT: [place]/AT, face symmetric, no scleral icterus, PERRLA, nares patent without drainage, pharynx without erythema or exudate. Neck: No JVD, tracheostomy tube in place. Lungs: no  accessory muscle use, CTAB, no wheezes or rales Cardiovascular: RRR, no peripheral edema Abdomen: BS present, soft, NT/ND, no hepatosplenomegaly.  Former G-tube site c/d/i, no strike through on gauze, but did have dried Kwiecinski fluid visible on gauze.  Skin intact, no purulent drainage  noted, no TTP of abdomen surrounding site, no erythema, no induration.  Wound re-dressed.  Wt Readings from Last 3 Encounters:  01/11/17 124 lb 1.6 oz (56.3 kg)  11/30/16 132 lb (59.9 kg)  11/05/16 128 lb (58.1 kg)   Assessment/Plan:  Visit for wound check -Former g-tube site does not appear infected. -Pt and daughter advised to keep appt tomorrow with PCP. -Discussed wound care. -Advised f/u with surgeon -Given RTC precautions   Given flu shot.

## 2017-01-11 NOTE — Progress Notes (Signed)
[facility] Phone: [phone]   Date of Visit: 01/12/2017   HPI:  HTN: -Patient is here with his daughter and sister for today's visit -Daughter provides much of the history for patient -Patient has a history of labile hypertension.  He has a history of orthostatic hypotension and had to be taken off of his usual blood pressure medications due to low blood pressures.  -Daughter reports that he has been off of his blood pressure medication since the last time he was seen in clinic.  His medications included Norvasc 5 mg daily and bisoprolol. -She also reports that in the past 1.5 weeks he has been having low blood pressures in the morning but throughout the day it has been running high.  In the morning his ranges would be 100/60, 100/68.  As a day progressed his blood pressure would be 152/110 all the way up to 180/110 -Patient denies any chest pain, shortness of breath, headache, blurred vision   Hypothyroidism: -Patient is usually seen by endocrinology.  His last visit was in March.  Daughter reports that they did not follow-up yet because his medication was changed while he was hospitalized.  Per chart review this was in July. -He is compliant with his Synthroid 112 mcg daily. -His last TSH in our chart was 18.1 which was in April.  History of cancer: -Per chart review has been followed by [facility].  His last office visit that I can see was in May 2017.  At that time the note reports follow-up at 3 months.  But I do not see a note for this follow-up visit.  I asked his daughter about this she reports that he has been back to [facility] and they have cleared him for yearly checkups now.  Transaminitis: - Per chart review he was noted to have elevated alkaline phosphatase, AST, and ALT on CMP obtained in July 19th.  They were all normal on CMP obtained on July 07/2016.  -Patient does have a history of alcohol abuse.  His family reports that he has cut down on  drinking.  He currently drinks 0.5 pints of liquor in 3-4 days and 40 ounces of beer daily.  -He denies any abdominal pain, nausea, vomiting ROS: See HPI.  [place]:  PMH: HTN Cancer of Larynx Chronic Pancreatitis HFrEF Primary Adrenal Insufficiency Protein Calorie Malnutrition   PHYSICAL EXAM: BP (!) 152/110   Pulse 89   Temp 97.9 F (36.6 C) (Oral)   Ht 5\' 9"  (1.753 m)   Wt 124 lb (56.2 kg)   SpO2 98%   BMI 18.31 kg/m  GEN: NAD, thin, has tracheostomy CV: RRR, no murmurs, rubs, or gallops PULM: CTAB, normal effort ABD: Soft, nontender, nondistended, NABS, no organomegaly SKIN: No rash or cyanosis; warm and well-perfused PSYCH: Mood and affect euthymic, normal rate and volume of speech NEURO: Awake, alert, no focal deficits grossly, normal speech   ASSESSMENT/PLAN:  Health maintenance:  -Discussed screening for colorectal cancer.  Patient is interested in getting a colonoscopy.  GI referral made today.  Essential hypertension, benign Blood pressure still elevated with repeat check.  We discussed the difficulty of balancing his hypotension with his hypertension.  We decided to start low-dose Norvasc at 2.5 mg daily and continue to hold the bisoprolol.  Patient to take Norvasc a few hours after he wakes up since his initial blood pressures in the day are lower.  Family understands this plan.  Hypothyroidism Usually followed by endocrinology.  However  his last visit was in March/2018.  His last TSH was in April 2018.  Patient does not have a follow-up visit yet.  We will recheck TSH today.  Continue current dose of Synthroid for now.  Transaminitis: Possibly due to alcohol use.  This is new for him as his CMP prior to this past one was normal.  His last CT abdomen showed normal liver.  Will repeat CMP and also obtain hepatitis panel due to his risk factors.   Smiley Houseman, MD PGY [place]

## 2017-01-12 ENCOUNTER — Ambulatory Visit (INDEPENDENT_AMBULATORY_CARE_PROVIDER_SITE_OTHER): Payer: Medicare Other | Admitting: Internal Medicine

## 2017-01-12 ENCOUNTER — Other Ambulatory Visit: Payer: Self-pay

## 2017-01-12 ENCOUNTER — Other Ambulatory Visit: Payer: Self-pay | Admitting: Internal Medicine

## 2017-01-12 ENCOUNTER — Encounter: Payer: Self-pay | Admitting: Internal Medicine

## 2017-01-12 VITALS — BP 152/110 | HR 89 | Temp 97.9°F | Ht 69.0 in | Wt 124.0 lb

## 2017-01-12 DIAGNOSIS — R7401 Elevation of levels of liver transaminase levels: Secondary | ICD-10-CM

## 2017-01-12 DIAGNOSIS — E039 Hypothyroidism, unspecified: Secondary | ICD-10-CM | POA: Diagnosis not present

## 2017-01-12 DIAGNOSIS — R740 Nonspecific elevation of levels of transaminase and lactic acid dehydrogenase [LDH]: Secondary | ICD-10-CM

## 2017-01-12 DIAGNOSIS — I10 Essential (primary) hypertension: Secondary | ICD-10-CM | POA: Diagnosis not present

## 2017-01-12 DIAGNOSIS — Z1211 Encounter for screening for malignant neoplasm of colon: Secondary | ICD-10-CM

## 2017-01-12 MED ORDER — AMLODIPINE BESYLATE 2.5 MG PO TABS: 2.5000 mg | ORAL_TABLET | Freq: Every day | ORAL | 0 refills | Status: DC

## 2017-01-12 NOTE — Patient Instructions (Addendum)
Lets start Norvasc 2.5mg  daily in the morning.  Lets get some blood work today  I referred him for a colonoscopy Follow up 3-4 weeks

## 2017-01-13 ENCOUNTER — Encounter: Payer: Self-pay | Admitting: Internal Medicine

## 2017-01-13 LAB — CMP14+EGFR
A/G RATIO: 0.9 — AB (ref 1.2–2.2)
ALK PHOS: 103 IU/L (ref 39–117)
ALT: 7 IU/L (ref 0–44)
AST: 21 IU/L (ref 0–40)
Albumin: 3.9 g/dL (ref 3.6–4.8)
BILIRUBIN TOTAL: 0.8 mg/dL (ref 0.0–1.2)
BUN/Creatinine Ratio: 9 — ABNORMAL LOW (ref 10–24)
BUN: 10 mg/dL (ref 8–27)
CHLORIDE: 102 mmol/L (ref 96–106)
CO2: 24 mmol/L (ref 20–29)
Calcium: 8.8 mg/dL (ref 8.6–10.2)
Creatinine, Ser: 1.15 mg/dL (ref 0.76–1.27)
GFR calc non Af Amer: 67 mL/min/{1.73_m2} (ref >59)
GFR, EST AFRICAN AMERICAN: 77 mL/min/{1.73_m2} (ref >59)
GLUCOSE: 84 mg/dL (ref 65–99)
Globulin, Total: 4.2 g/dL (ref 1.5–4.5)
POTASSIUM: 4.5 mmol/L (ref 3.5–5.2)
Sodium: 138 mmol/L (ref 134–144)
Total Protein: 8.1 g/dL (ref 6.0–8.5)

## 2017-01-13 LAB — TSH: TSH: 27.33 u[IU]/mL — AB (ref 0.450–4.500)

## 2017-01-13 LAB — HEPATITIS PANEL, ACUTE
HEP A IGM: NEGATIVE
HEP B S AG: NEGATIVE
Hep B C IgM: NEGATIVE
Hep C Virus Ab: <0.1 s/co ratio (ref 0.0–0.9)

## 2017-01-13 NOTE — Assessment & Plan Note (Signed)
Usually followed by endocrinology.  However his last visit was in March/2018.  His last TSH was in April 2018.  Patient does not have a follow-up visit yet.  We will recheck TSH today.  Continue current dose of Synthroid for now.

## 2017-01-13 NOTE — Assessment & Plan Note (Signed)
Blood pressure still elevated with repeat check.  We discussed the difficulty of balancing his hypotension with his hypertension.  We decided to start low-dose Norvasc at 2.5 mg daily and continue to hold the bisoprolol.  Patient to take Norvasc a few hours after he wakes up since his initial blood pressures in the day are lower.  Family understands this plan.

## 2017-01-19 ENCOUNTER — Ambulatory Visit: Payer: Self-pay

## 2017-01-23 ENCOUNTER — Other Ambulatory Visit: Payer: Self-pay | Admitting: Pharmacist

## 2017-01-23 NOTE — Patient Outreach (Addendum)
[facility]) Care Management  01/23/2017  [name] [birth year] [id number]  [name] daughter, [name] was called to follow up on Medicare choices for next year. HIPAA identifiers were obtained. [name] daughter reported that she logged onto the Medicare website as was suggested at our last conversation to obtained the patient's medicare card.  The Medicare.gov website was reviewed today and it was determined the patient has had extra help since 2015.  In addition, the patient's daughter reported the patient has a Special educational needs teacher. Research on the Medicare webster did not support the patient having a Medicare Advantage plan as he had a free standing Medicare Part D Plan.  [name] daughter was given the [facility] number again and it was suggested that she call and investigate her father's options.  Especially since she thought he had a Medicare Advantage Plan.  Plan:  Close patient's case as he has extra help and is not eligible for patient assistance programs.   Elayne Guerin, PharmD, [place] Clinical Pharmacist [phone]

## 2017-02-02 ENCOUNTER — Telehealth: Payer: Self-pay | Admitting: Internal Medicine

## 2017-02-02 ENCOUNTER — Other Ambulatory Visit: Payer: Self-pay | Admitting: Internal Medicine

## 2017-02-02 MED ORDER — LEVOTHYROXINE SODIUM 150 MCG PO TABS: ORAL_TABLET | ORAL | 1 refills | Status: DC

## 2017-02-02 NOTE — Telephone Encounter (Signed)
I was reviewing patient's chart for his upcoming appointment next week.   For some reason, it looks like I never changed the synthroid dose after getting lab results with elevated TSH.   I called patient's daughter who is his caretaker. I discussed this and apologized for the overlook.   Patient has been doing well with his blood pressure. He is taking Norvasc as prescribed and his bp is on average 130/80. He is no longer having low blood pressures either.   He is taking Synthroid 112 daily as prescribed and appropriately. We discussed increasing to Synthroid 168mcg daily.   Daughter will reschedule appointment for 4-6 weeks unless there are concerns that come up.

## 2017-02-06 ENCOUNTER — Ambulatory Visit: Payer: Medicare Other | Admitting: Internal Medicine

## 2017-02-12 ENCOUNTER — Other Ambulatory Visit: Payer: Self-pay

## 2017-02-12 NOTE — Patient Outreach (Signed)
[facility]) Care Management  02/12/17  [name]  Apr 22, 1950 [id number]  Successful outreach completed with patient's daughter, [name]. Patient identification verified.  Per [name], patient's feeding tube site is healing up well. She stated that it is no longer draining. She stated that she took him in to see his doctor and they decided together not to replace the feeding tube because he is no longer using it for any nutritional needs.  She stated that he was having some trouble with his blood pressure being lower in the morning, but would become really high and would not come down no matter what they tried. She stated that his blood pressure medications have been adjusted and he was placed back on his norvasx at a low dose (2.5 mg). She stated that since adding that back, all of his blood pressure readings in the morning and afternoon have been in a normal range.   She stated that patient's thyroid levels have also been high, so they reduced his levothyroxine also. He has a follow up appointment at end of December to look at his medications.  She reported that patient has been doing very well and has been able to tolerate po intake. She stated that he has not had any more dizzy spells or any signs of his blood pressure being too low.   [name] currently denies any needs or concerns at present.   Plan: RNCM will follow up in month after his follow up appointment. If he remains stable with BP will plan for case closure.  [name], RN, BSN, [place] Management Coordinator [phone]

## 2017-02-14 ENCOUNTER — Encounter: Payer: Self-pay | Admitting: Internal Medicine

## 2017-02-23 ENCOUNTER — Other Ambulatory Visit: Payer: Self-pay

## 2017-02-23 NOTE — Patient Outreach (Addendum)
[facility]) Care Management  02/23/2017  [name] 04-20-1950 [id number]  Referral received 02/21/17 (late entry correct date for receipt of referral).  66 year old with history of laryngeal cancer, hypothyriodism, alcohol use, heart failure, cirrhosis, hyperlipidemia, arthritis.  RNCM called daughter, [name] (per chart-she handle's all medical concerns and is listed on consent) regarding Care management needs.  Plan: await return call and follow up next week if no return call.  Thea Silversmith, RN, MSN, [place] Coordinator Cell: [phone]

## 2017-02-26 ENCOUNTER — Other Ambulatory Visit: Payer: Self-pay

## 2017-02-26 NOTE — Patient Outreach (Signed)
[facility]) Care Management  02/26/2017  [name] 12/03/1950 [id number]   Referral received 02/21/17:  66 year old with history of laryngeal cancer, hypothyriodism, alcohol use, heart failure, cirrhosis, hyperlipidemia, arthritis.  RNCM called daughter, [name] (per chart-she handle's all medical concerns and is listed on consent) regarding Care management needs.  Plan: await return call and follow up next week if no return call.  Thea Silversmith, RN, MSN, [place] Coordinator Cell: [phone]

## 2017-03-01 NOTE — Progress Notes (Signed)
   [facility] Phone: [phone]   Date of Visit: 03/02/2017   HPI:  Here for follow up of Hypothyroidism and HTN.   HTN:  - medication: Norvasc 2.5mg  daily - reports he does not have low or severely high blood pressures anymore. Reports he has not felt this well in a while - denies chest pain, shortness of breath, lower extremity swelling.   Hypothyroidism:  - Last TSH was 27  - we increased Synthroid to 177mcg - takes appropriately without missed doses - no skin changes, diarrhea/constipation, hair loss  Normocytic Anemia:  - denies blood in stool or urine.   Of note, his daughter reports that when he was seen in the ED in late September because his G tube fell out, the provider spoke with the on call GI physician who recommended not replacing the g tube. He has been doing well with oral intake and has not used G tube. She reports that when he gets sick, his oral intake decreases. She felt better when he had a G tube as a "back up". He has been eating well and doing well thus far.   ROS: See HPI.  [place]: PMH: HTN Cancer of Larynx Chronic Pancreatitis HFrEF Primary Adrenal Insufficiency Protein Calorie Malnutrition  PHYSICAL EXAM: BP (!) 144/86   Pulse 91   Temp 99 F (37.2 C) (Oral)   Wt 125 lb (56.7 kg)   SpO2 99%   BMI 18.46 kg/m  GEN: NAD HEENT: Atraumatic, normocephalic, neck supple, EOMI, sclera clear  CV: RRR, no murmurs, rubs, or gallops PULM: CTAB, normal effort ABD: Soft, nontender, nondistended, NABS, no organomegaly SKIN: No rash or cyanosis; warm and well-perfused EXTR: No lower extremity edema or calf tenderness PSYCH: Mood and affect euthymic, normal rate and volume of speech NEURO: Awake, alert, no focal deficits grossly, normal speech   ASSESSMENT/PLAN:  Health maintenance:  -GI has been having a difficult time getting in touch with patient to schedule appointment for colonoscopy. Provided contact information to  call the GI clinic. - lipid panel today  Hypothyroidism Repeat TSH today after recent dose increase.   Essential hypertension, benign BP is good. The goal is to avoid hypotension as he has had this in the past while also avoiding significant highs as well. Continue Norvasc 2.5mg  daily.   Hyperlipidemia Lipid panel today.   Normocytic Anemia: history of normocytic anemia. hgb 10.7 in July. Iron panel ordered.  -GI has been having a difficult time getting in touch with patient to schedule appointment for colonoscopy. Provided contact information to call the GI clinic.  Smiley Houseman, MD PGY [place]

## 2017-03-02 ENCOUNTER — Ambulatory Visit (INDEPENDENT_AMBULATORY_CARE_PROVIDER_SITE_OTHER): Payer: Medicare Other | Admitting: Internal Medicine

## 2017-03-02 ENCOUNTER — Other Ambulatory Visit: Payer: Self-pay

## 2017-03-02 ENCOUNTER — Encounter: Payer: Self-pay | Admitting: Internal Medicine

## 2017-03-02 VITALS — BP 144/86 | HR 91 | Temp 99.0°F | Wt 125.0 lb

## 2017-03-02 DIAGNOSIS — E785 Hyperlipidemia, unspecified: Secondary | ICD-10-CM | POA: Diagnosis not present

## 2017-03-02 DIAGNOSIS — D649 Anemia, unspecified: Secondary | ICD-10-CM | POA: Diagnosis not present

## 2017-03-02 DIAGNOSIS — E7800 Pure hypercholesterolemia, unspecified: Secondary | ICD-10-CM

## 2017-03-02 DIAGNOSIS — E039 Hypothyroidism, unspecified: Secondary | ICD-10-CM | POA: Diagnosis not present

## 2017-03-02 DIAGNOSIS — I10 Essential (primary) hypertension: Secondary | ICD-10-CM | POA: Diagnosis not present

## 2017-03-02 NOTE — Assessment & Plan Note (Signed)
Repeat TSH today after recent dose increase.

## 2017-03-02 NOTE — Assessment & Plan Note (Signed)
BP is good. The goal is to avoid hypotension as he has had this in the past while also avoiding significant highs as well. Continue Norvasc 2.5mg  daily.

## 2017-03-02 NOTE — Patient Instructions (Addendum)
The GI doctor's clinic has been trying to get in touch with you to schedule a colonoscopy: please give them a call.  Dr. Elyse Jarvis. [facility]  [address] [place], [place] [address]  [phone]  We will check your thyroid levels again. We will check iron levels and cholesterol levels today.

## 2017-03-02 NOTE — Assessment & Plan Note (Signed)
Lipid panel today

## 2017-03-03 LAB — LIPID PANEL
CHOLESTEROL TOTAL: 160 mg/dL (ref 100–199)
Chol/HDL Ratio: 2.9 ratio (ref 0.0–5.0)
HDL: 55 mg/dL (ref >39)
LDL Calculated: 90 mg/dL (ref 0–99)
TRIGLYCERIDES: 75 mg/dL (ref 0–149)
VLDL CHOLESTEROL CAL: 15 mg/dL (ref 5–40)

## 2017-03-03 LAB — IRON AND TIBC
Iron Saturation: 45 % (ref 15–55)
Iron: 126 ug/dL (ref 38–169)
TIBC: 281 ug/dL (ref 250–450)
UIBC: 155 ug/dL (ref 111–343)

## 2017-03-03 LAB — FERRITIN: FERRITIN: 374 ng/mL (ref 30–400)

## 2017-03-03 LAB — TSH: TSH: 2.05 u[IU]/mL (ref 0.450–4.500)

## 2017-03-07 ENCOUNTER — Telehealth: Payer: Self-pay | Admitting: Internal Medicine

## 2017-03-07 NOTE — Telephone Encounter (Signed)
Called daughter but went to voicemai, left message to call back

## 2017-03-09 ENCOUNTER — Other Ambulatory Visit: Payer: Self-pay

## 2017-03-09 NOTE — Patient Outreach (Signed)
[facility]) Care Management  03/09/2017  Ericson Nafziger [birth year] [id number]  Referral received 02/21/17:  Subjective: "As of right now he is doing well and I am managing.I think he is in a good place right now."  Objective: none  Assessment: 67 year old with history of laryngeal cancer, hypothyriodism, alcohol use, heart failure, cirrhosis, hyperlipidemia, arthritis.  RNCM called spoke with daughter [name] Snipes(caregiver). Two patient identifiers confirmed.  Ms. Renaldo Fiddler reports: client is doing better, she reports client had a follow up appointment with primary care last week.  Re: blood pressure and thyroid medication change. Ms. Renaldo Fiddler reports medication change per provider has helped and clients blood pressure is much better.   Re: PEG tube site: She reports PEG tube is still out and draining, but the drainage is slowing down. Per Ms. Snipes, this was addressed at client's last primary care office visit and she reports primary care is managing/monitoring this. She states the site looks good; it is clean dry; no redness; no swelling; no signs of infection. She reports primary care recommendation is not to replace the PEG tube at this time. She states if client went back to being Failure to thrive then they would reconsider replacing the tube. Ms. Watkinson reports she will continue to monitor. RNCM reinforced that if at any time there was a need for a second opinion on the drain tube and/or on the need to replace drain, the primary care could refer client to a Gastroenterologist.  Re: medications-she reports based on intervention from [facility] pharmacist, client is receiving additional assistance for medications and states she is now able to afford client's medications.  RNCM offered home visit to reassess needs. Ms. Renaldo Fiddler declines home visit denying any care management needs and states client is doing well at this time.  RNCM reinforced to Ms. Snipes that she could  call [facility] CM as needed for questions, concern, resources, restart of CM services. RNCM also reinforced that primary care provider could also refer as needed.  RNCM reinforced 24 hour nurse advice line was also available to call as needed and confirmed that she still has the contact number.  Plan: close case.  Thea Silversmith, RN, MSN, [place] Coordinator Cell: [phone]

## 2017-03-13 NOTE — Progress Notes (Signed)
Daughter returning call to PCP. Hubbard Hartshorn, RN, BSN

## 2017-03-14 ENCOUNTER — Telehealth: Payer: Self-pay | Admitting: Internal Medicine

## 2017-03-14 NOTE — Progress Notes (Signed)
Refer to phone note

## 2017-03-14 NOTE — Telephone Encounter (Signed)
Unable to reach daughter. Voicemail is full. Please inform her that the labs we got at the last visit for [name] were all within normal limits.

## 2017-03-15 ENCOUNTER — Telehealth: Payer: Self-pay | Admitting: Internal Medicine

## 2017-03-15 NOTE — Telephone Encounter (Signed)
Duplicate message.  Daughter informed. Charisa Twitty,CMA

## 2017-03-15 NOTE — Telephone Encounter (Signed)
Tried calling patient but there was no answer and voicemail was full.  Will mail him a letter. [name]

## 2017-03-15 NOTE — Telephone Encounter (Signed)
Daughter informed of results. Jazmin Hartsell,CMA

## 2017-03-15 NOTE — Telephone Encounter (Signed)
Daughter is returning our call. She said that she is there now and I would like [place] to call back. jw

## 2017-03-21 ENCOUNTER — Other Ambulatory Visit: Payer: Self-pay | Admitting: Internal Medicine

## 2017-04-25 DIAGNOSIS — G4489 Other headache syndrome: Secondary | ICD-10-CM | POA: Diagnosis not present

## 2017-04-25 DIAGNOSIS — H906 Mixed conductive and sensorineural hearing loss, bilateral: Secondary | ICD-10-CM | POA: Diagnosis not present

## 2017-04-25 DIAGNOSIS — M879 Osteonecrosis, unspecified: Secondary | ICD-10-CM | POA: Diagnosis not present

## 2017-04-25 DIAGNOSIS — H6042 Cholesteatoma of left external ear: Secondary | ICD-10-CM | POA: Diagnosis not present

## 2017-04-30 ENCOUNTER — Encounter (INDEPENDENT_AMBULATORY_CARE_PROVIDER_SITE_OTHER): Payer: Self-pay | Admitting: Orthopaedic Surgery

## 2017-04-30 ENCOUNTER — Ambulatory Visit (INDEPENDENT_AMBULATORY_CARE_PROVIDER_SITE_OTHER): Payer: Medicare Other | Admitting: Orthopaedic Surgery

## 2017-04-30 ENCOUNTER — Ambulatory Visit (INDEPENDENT_AMBULATORY_CARE_PROVIDER_SITE_OTHER): Payer: Medicare Other

## 2017-04-30 DIAGNOSIS — Z96641 Presence of right artificial hip joint: Secondary | ICD-10-CM | POA: Diagnosis not present

## 2017-04-30 NOTE — Progress Notes (Signed)
The patient is now 7 months status post a right total hip arthroplasty through direct anterior approach.  He has no complaints with his hip at all.  This is doing well and has excellent strength and range of motion.  On examination his right hip appears to move normal as is his left hip.  His leg lengths are equal.  Incisions well-healed.  X-rays of the pelvis and right hip show well-seated implant with no complicating features.  Is no evidence of loosening.  Of note he does have some arthritic changes and even potentially some avascular necrosis changes in the left hip but is completely asymptomatic.  At this point follow-up as needed knowing that if he has any issues at all orthopedically we can see him back at any time.  All questions concerns were answered and addressed.

## 2017-05-01 ENCOUNTER — Other Ambulatory Visit: Payer: Self-pay | Admitting: Otolaryngology

## 2017-05-01 DIAGNOSIS — H6042 Cholesteatoma of left external ear: Secondary | ICD-10-CM

## 2017-05-01 DIAGNOSIS — H906 Mixed conductive and sensorineural hearing loss, bilateral: Secondary | ICD-10-CM

## 2017-05-29 ENCOUNTER — Ambulatory Visit
Admission: RE | Admit: 2017-05-29 | Discharge: 2017-05-29 | Disposition: A | Discharge status: Home / Self Care | Payer: Medicare Other | Source: Ambulatory Visit | Attending: Otolaryngology | Admitting: Otolaryngology

## 2017-05-29 DIAGNOSIS — J329 Chronic sinusitis, unspecified: Secondary | ICD-10-CM | POA: Diagnosis not present

## 2017-05-29 DIAGNOSIS — H906 Mixed conductive and sensorineural hearing loss, bilateral: Secondary | ICD-10-CM

## 2017-05-29 DIAGNOSIS — H6042 Cholesteatoma of left external ear: Secondary | ICD-10-CM

## 2017-05-29 MED ORDER — IOPAMIDOL (ISOVUE-300) INJECTION 61%
75.0000 mL | Freq: Once | INTRAVENOUS | Status: AC | PRN
  Administered 2017-05-29: 75 mL via INTRAVENOUS

## 2017-07-09 DIAGNOSIS — Z681 Body mass index (BMI) 19 or less, adult: Secondary | ICD-10-CM | POA: Diagnosis not present

## 2017-07-09 DIAGNOSIS — D329 Benign neoplasm of meninges, unspecified: Secondary | ICD-10-CM | POA: Diagnosis not present

## 2017-07-09 DIAGNOSIS — I10 Essential (primary) hypertension: Secondary | ICD-10-CM | POA: Diagnosis not present

## 2017-07-09 DIAGNOSIS — K7030 Alcoholic cirrhosis of liver without ascites: Secondary | ICD-10-CM | POA: Diagnosis not present

## 2017-07-09 DIAGNOSIS — C140 Malignant neoplasm of pharynx, unspecified: Secondary | ICD-10-CM | POA: Diagnosis not present

## 2017-08-28 ENCOUNTER — Other Ambulatory Visit: Payer: Self-pay

## 2017-08-28 ENCOUNTER — Encounter: Payer: Self-pay | Admitting: Family Medicine

## 2017-08-28 ENCOUNTER — Ambulatory Visit (INDEPENDENT_AMBULATORY_CARE_PROVIDER_SITE_OTHER): Payer: Medicare Other | Admitting: Family Medicine

## 2017-08-28 ENCOUNTER — Other Ambulatory Visit: Payer: Self-pay | Admitting: Internal Medicine

## 2017-08-28 VITALS — BP 158/90 | HR 102 | Temp 98.1°F | Wt 116.0 lb

## 2017-08-28 DIAGNOSIS — W57XXXA Bitten or stung by nonvenomous insect and other nonvenomous arthropods, initial encounter: Secondary | ICD-10-CM

## 2017-08-28 DIAGNOSIS — S70362A Insect bite (nonvenomous), left thigh, initial encounter: Secondary | ICD-10-CM | POA: Diagnosis present

## 2017-08-28 MED ORDER — DOXYCYCLINE HYCLATE 100 MG PO TABS: 100.0000 mg | ORAL_TABLET | Freq: Two times a day (BID) | ORAL | 0 refills | Status: DC

## 2017-08-28 NOTE — Telephone Encounter (Signed)
Patient should make a follow up appointment for his thyroid. Last checked in 02/2017. Would recommend getting this checked twice a year when his thyroid level is stable.

## 2017-08-28 NOTE — Telephone Encounter (Signed)
LM for patient/family to call back and schedule an appointment to follow up on his thyroid.  Danel Studzinski,CMA

## 2017-08-28 NOTE — Patient Instructions (Signed)
  It was great seeing you today!  I think the tick bite area is infected. I sent in an antibiotic to your pharmacy. Please take this WITH FOOD twice a day for 10 days.  Please return to be seen if your symptoms worsen or if you develop more redness, pus, or fevers.  If you have questions or concerns please do not hesitate to call at [phone].  Nathaniel Maine, DO PGY-2, [facility] 08/28/2017 11:00 AM    Cellulitis, Adult Cellulitis is a skin infection. The infected area is usually red and sore. This condition occurs most often in the arms and lower legs. It is very important to get treated for this condition. Follow these instructions at home:  Take over-the-counter and prescription medicines only as told by your doctor.  If you were prescribed an antibiotic medicine, take it as told by your doctor. Do not stop taking the antibiotic even if you start to feel better.  Drink enough fluid to keep your pee (urine) clear or pale yellow.  Do not touch or rub the infected area.  Raise (elevate) the infected area above the level of your heart while you are sitting or lying down.  Place warm or cold wet cloths (warm or cold compresses) on the infected area. Do this as told by your doctor.  Keep all follow-up visits as told by your doctor. This is important. These visits let your doctor make sure your infection is not getting worse. Contact a doctor if:  You have a fever.  Your symptoms do not get better after 1-2 days of treatment.  Your bone or joint under the infected area starts to hurt after the skin has healed.  Your infection comes back. This can happen in the same area or another area.  You have a swollen bump in the infected area.  You have new symptoms.  You feel ill and also have muscle aches and pains. Get help right away if:  Your symptoms get worse.  You feel very sleepy.  You throw up (vomit) or have watery poop (diarrhea) for a long  time.  There are red streaks coming from the infected area.  Your red area gets larger.  Your red area turns darker. This information is not intended to replace advice given to you by your health care provider. Make sure you discuss any questions you have with your health care provider. Document Released: 08/09/2007 Document Revised: 07/29/2015 Document Reviewed: 12/30/2014 Elsevier Interactive Patient Education  2018 [employer].

## 2017-08-28 NOTE — Progress Notes (Signed)
    Subjective:    [name], male    DOB: 04-Apr-1950, 67 y.o.   MRN: [id number]   CC: tick bite  HPI: patient reports he went camping over the weekend and found a tick on his thigh Sunday evening. Unsure what type of tick it was. He reports he removed it and flushed it. The tick was not engorged with blood. He wants to get the site checked out. He reports no pain at the bite area, no drainage from wound. No fevers, chills, rash, joint pains. He overall feels well.   Smoking status reviewed- former smoker  Review of Systems- see HPI   Objective:  BP (!) 158/90   Pulse (!) 102   Temp 98.1 F (36.7 C) (Oral)   Wt 116 lb (52.6 kg)   SpO2 97%   BMI 17.13 kg/m  Vitals and nursing note reviewed  General: thin elderly male in NAD Neck: trach in place Cardiac: RRR, clear S1 and S2, no murmurs, rubs, or gallops Respiratory: clear to auscultation bilaterally, no increased work of breathing Skin: warm and dry. Area of erythema and induration on proximal left lateral thigh with central puncture, no drainage, no fluctuance. Surrounding erythema extends about 3 inches from bite Neuro: alert and oriented, no focal deficits   Assessment & Plan:    1. Tick bite of left thigh, initial encounter Concern for cellulitis based on erythema, induration. Will treat with 10 day course of doxy. Advised patient to return if redness is spreading, he has pus coming from wound, or he develops fevers/chills. He is agreeable.    Return if symptoms worsen or fail to improve.   Lucila Maine, DO Family Medicine Resident PGY-2

## 2017-08-29 DIAGNOSIS — R490 Dysphonia: Secondary | ICD-10-CM | POA: Diagnosis not present

## 2017-08-29 DIAGNOSIS — J9503 Malfunction of tracheostomy stoma: Secondary | ICD-10-CM | POA: Diagnosis not present

## 2017-08-29 DIAGNOSIS — Z963 Presence of artificial larynx: Secondary | ICD-10-CM | POA: Diagnosis not present

## 2017-09-12 ENCOUNTER — Other Ambulatory Visit: Payer: Self-pay | Admitting: Internal Medicine

## 2017-09-13 MED ORDER — AMLODIPINE BESYLATE 2.5 MG PO TABS: ORAL_TABLET | ORAL | 1 refills | Status: DC

## 2017-09-13 NOTE — Addendum Note (Signed)
Addended by: Valerie Roys on: 09/13/2017 04:00 PM   Modules accepted: Orders

## 2017-09-13 NOTE — Telephone Encounter (Signed)
Patient is requesting a 90 day supply of amlodipine.  Will send this in to pharmacy.  Lidie Glade,CMA

## 2017-11-08 ENCOUNTER — Other Ambulatory Visit: Payer: Self-pay | Admitting: Internal Medicine

## 2017-11-08 ENCOUNTER — Other Ambulatory Visit: Payer: Self-pay | Admitting: Family Medicine

## 2018-01-24 DIAGNOSIS — Z23 Encounter for immunization: Secondary | ICD-10-CM | POA: Diagnosis not present

## 2018-02-06 ENCOUNTER — Other Ambulatory Visit: Payer: Self-pay | Admitting: Family Medicine

## 2018-02-12 ENCOUNTER — Ambulatory Visit (INDEPENDENT_AMBULATORY_CARE_PROVIDER_SITE_OTHER): Payer: Medicare Other | Admitting: Family Medicine

## 2018-02-12 ENCOUNTER — Encounter: Payer: Self-pay | Admitting: Family Medicine

## 2018-02-12 ENCOUNTER — Other Ambulatory Visit: Payer: Self-pay

## 2018-02-12 ENCOUNTER — Ambulatory Visit (HOSPITAL_COMMUNITY)
Admission: RE | Admit: 2018-02-12 | Discharge: 2018-02-12 | Disposition: A | Discharge status: Home / Self Care | Payer: Medicare Other | Source: Ambulatory Visit | Attending: Family Medicine | Admitting: Family Medicine

## 2018-02-12 VITALS — BP 128/86 | HR 109 | Temp 98.1°F | Ht 69.0 in | Wt 120.0 lb

## 2018-02-12 DIAGNOSIS — E43 Unspecified severe protein-calorie malnutrition: Secondary | ICD-10-CM | POA: Diagnosis not present

## 2018-02-12 DIAGNOSIS — Z1211 Encounter for screening for malignant neoplasm of colon: Secondary | ICD-10-CM

## 2018-02-12 DIAGNOSIS — R000 Tachycardia, unspecified: Secondary | ICD-10-CM

## 2018-02-12 DIAGNOSIS — Z23 Encounter for immunization: Secondary | ICD-10-CM

## 2018-02-12 DIAGNOSIS — I10 Essential (primary) hypertension: Secondary | ICD-10-CM

## 2018-02-12 MED ORDER — AMLODIPINE BESYLATE 2.5 MG PO TABS: ORAL_TABLET | ORAL | 1 refills | Status: DC

## 2018-02-12 NOTE — Progress Notes (Signed)
   [facility]           Phone: [phone]  Date of Visit: 02/12/2018   HPI:  Patient presents today for a well adult male exam.   Concerns today: none Exercise: does not really exercise Diet: tries to eat pretty healthy but doesn't have much of an appetite most days Smoking: h/o smoking and throat cancer, no longer smokes due to tracheostomy Alcohol: drinks 1 quart of beer per day and a half pint of liquor at least 3x per week- would like to quit and advised that he should try to cut down, agreed to stop liquor first and then will begin to cut down on beer Drugs: uses marijuana about 1x per month but is stopping bc he isnt able to inhale to get the high Advance directives: daughter is HCPOA and lives with him Mood: denies SI/HI and reports he feels great most days  ROS: Denies SOB, CP, headaches, reports dizziness sometimes with standing quickly, denies leg swelling, N/V/D/C or abdominal pain.   Family History  Problem Relation Age of Onset  . Hyperlipidemia Mother   . Hypertension Mother   . Hypertension Father   . Hyperlipidemia Father     PHYSICAL EXAM: BP 128/86   Pulse (!) 109   Temp 98.1 F (36.7 C) (Oral)   Ht 5\' 9"  (1.753 m)   Wt 120 lb (54.4 kg)   SpO2 98%   BMI 17.72 kg/m  Gen: NAD, pleasant, cooperative, thin and elderly appearing HEENT: NCAT, PERRL, no palpable thyromegaly or anterior cervical lymphadenopathy, trach in place Heart: RRR, no murmurs Lungs: CTAB, NWOB Abdomen: soft, nontender to palpation, no masses felt Neuro: grossly nonfocal GU: deferred  ASSESSMENT/PLAN:  # Health maintenance:  -immunizations: PNA vaccine given today -lipid screening: performed today -colonoscopy: patient will schedule, placed referral to GI -advance directives: daughter is HCPOA -handout given on health maintenance topics -Patient encouraged to stop drinking alcohol- given resources, and avoid smoking marijuana  EKG for tachycardia on  exam- wnl with left axis deviation as seen on prior EKG.   [name] L ear cleaned out today.  CMP and CBC today given h/o anemia and alcohol use.   FOLLOW UP: Follow up in 6 months for HTN   [place] Kailin Principato, DO PGY-2 [place]

## 2018-02-12 NOTE — Patient Instructions (Signed)
Thank you for coming to see me today. It was a pleasure! Today we talked about:   We will call you with your results.   Please try to stop drinking any alcohol.   Please follow-up with me in 6 months or sooner as needed.  If you have any questions or concerns, please do not hesitate to call the office at [phone].  Take Care,   Nathaniel Patrick Salemi, DO  Things to do to Keep yourself Healthy: - Exercise 30-45 minutes a day, 3-5 days per week. It is recommended you get 150 minutes total per week of a moderate-intensity exercise.  - Eat a healthy diet with plenty of fruits and vegetables, up to 7-9 servings per day. Limit added sugar and fried foods. - Seatbelts can save your life. Wear them always. - Smoke detectors on every level of your home, check the batteries every year. - Eye Doctor - have an eye exam every 1-2 years. - Safe sex - if you may be exposed to STDs, use a condom. - Alcohol If you drink, do it moderately; less than 2 drinks per day. - [place] someone to speak for you if you are not able. - Depression is common in our stressful world. If you're feeling down or losing interest in things you normally enjoy, please do not hesitate to come in for a visit. - Violence - If anyone is threatening or hurting you, please call immediately.

## 2018-02-13 LAB — CBC
HEMATOCRIT: 34.7 % — AB (ref 37.5–51.0)
HEMOGLOBIN: 12.2 g/dL — AB (ref 13.0–17.7)
MCH: 30.9 pg (ref 26.6–33.0)
MCHC: 35.2 g/dL (ref 31.5–35.7)
MCV: 88 fL (ref 79–97)
Platelets: 178 10*3/uL (ref 150–450)
RBC: 3.95 x10E6/uL — AB (ref 4.14–5.80)
RDW: 14.1 % (ref 12.3–15.4)
WBC: 3.6 10*3/uL (ref 3.4–10.8)

## 2018-02-13 LAB — CMP14+EGFR
ALT: 19 IU/L (ref 0–44)
AST: 41 IU/L — AB (ref 0–40)
Albumin/Globulin Ratio: 1 — ABNORMAL LOW (ref 1.2–2.2)
Albumin: 3.9 g/dL (ref 3.6–4.8)
Alkaline Phosphatase: 79 IU/L (ref 39–117)
BILIRUBIN TOTAL: 1.2 mg/dL (ref 0.0–1.2)
BUN / CREAT RATIO: 8 — AB (ref 10–24)
BUN: 11 mg/dL (ref 8–27)
CHLORIDE: 96 mmol/L (ref 96–106)
CO2: 23 mmol/L (ref 20–29)
CREATININE: 1.32 mg/dL — AB (ref 0.76–1.27)
Calcium: 9.4 mg/dL (ref 8.6–10.2)
GFR calc Af Amer: 65 mL/min/{1.73_m2} (ref >59)
GFR calc non Af Amer: 56 mL/min/{1.73_m2} — ABNORMAL LOW (ref >59)
GLOBULIN, TOTAL: 4 g/dL (ref 1.5–4.5)
GLUCOSE: 87 mg/dL (ref 65–99)
Potassium: 4.7 mmol/L (ref 3.5–5.2)
Sodium: 134 mmol/L (ref 134–144)
Total Protein: 7.9 g/dL (ref 6.0–8.5)

## 2018-02-13 LAB — LIPID PANEL
CHOL/HDL RATIO: 2.5 ratio (ref 0.0–5.0)
CHOLESTEROL TOTAL: 191 mg/dL (ref 100–199)
HDL: 77 mg/dL (ref >39)
LDL CALC: 102 mg/dL — AB (ref 0–99)
TRIGLYCERIDES: 59 mg/dL (ref 0–149)
VLDL Cholesterol Cal: 12 mg/dL (ref 5–40)

## 2018-02-22 ENCOUNTER — Encounter: Payer: Self-pay | Admitting: Family Medicine

## 2018-02-22 NOTE — Progress Notes (Signed)
Unable to reach patient by phone.   Martinique Herb Beltre, DO PGY-2, [facility]

## 2018-03-08 ENCOUNTER — Other Ambulatory Visit: Payer: Self-pay | Admitting: Neurosurgery

## 2018-03-08 DIAGNOSIS — D329 Benign neoplasm of meninges, unspecified: Secondary | ICD-10-CM

## 2018-03-19 ENCOUNTER — Encounter: Payer: Self-pay | Admitting: Family Medicine

## 2018-12-11 ENCOUNTER — Ambulatory Visit: Payer: Medicare Other | Admitting: Family Medicine

## 2018-12-12 ENCOUNTER — Ambulatory Visit (INDEPENDENT_AMBULATORY_CARE_PROVIDER_SITE_OTHER): Payer: Medicare Other | Admitting: Family Medicine

## 2018-12-12 ENCOUNTER — Other Ambulatory Visit: Payer: Self-pay

## 2018-12-12 ENCOUNTER — Encounter: Payer: Self-pay | Admitting: Family Medicine

## 2018-12-12 VITALS — BP 160/102 | HR 54 | Wt 119.0 lb

## 2018-12-12 DIAGNOSIS — C321 Malignant neoplasm of supraglottis: Secondary | ICD-10-CM | POA: Diagnosis not present

## 2018-12-12 DIAGNOSIS — I5022 Chronic systolic (congestive) heart failure: Secondary | ICD-10-CM

## 2018-12-12 DIAGNOSIS — Z23 Encounter for immunization: Secondary | ICD-10-CM | POA: Diagnosis not present

## 2018-12-12 DIAGNOSIS — Z1211 Encounter for screening for malignant neoplasm of colon: Secondary | ICD-10-CM

## 2018-12-12 DIAGNOSIS — I10 Essential (primary) hypertension: Secondary | ICD-10-CM

## 2018-12-12 DIAGNOSIS — C140 Malignant neoplasm of pharynx, unspecified: Secondary | ICD-10-CM | POA: Diagnosis not present

## 2018-12-12 DIAGNOSIS — R42 Dizziness and giddiness: Secondary | ICD-10-CM

## 2018-12-12 DIAGNOSIS — E039 Hypothyroidism, unspecified: Secondary | ICD-10-CM | POA: Diagnosis not present

## 2018-12-12 DIAGNOSIS — E43 Unspecified severe protein-calorie malnutrition: Secondary | ICD-10-CM

## 2018-12-12 MED ORDER — ASPIRIN EC 81 MG PO TBEC: 81.0000 mg | DELAYED_RELEASE_TABLET | Freq: Two times a day (BID) | ORAL | 0 refills | Status: DC

## 2018-12-12 MED ORDER — LISINOPRIL 10 MG PO TABS: 10.0000 mg | ORAL_TABLET | Freq: Every day | ORAL | 3 refills | Status: DC

## 2018-12-12 NOTE — Patient Instructions (Signed)
Thank you for coming to see me today. It was a pleasure! Today we talked about:   For your blood pressure we will start you on a medication called lisinopril.  This will also help with your heart and kidneys.  Please take 10 mg or 1 tablet daily.  Do this instead of the amlodipine. If you are able to check your blood pressure before our next virtual visit that would be very helpful.  It is important that you follow-up with ENT.  I have placed a referral to [facility] that you may schedule an appointment with them so that they can recheck after your history of cancer.  I recommend that you start taking an aspirin every day.  I have sent this to your pharmacy as well.  We will call you with your lab results.  Please schedule a follow-up virtual visit with me in 2 weeks so that we can check in with you.   I have also placed a referral for you to be evaluated with a colonoscopy.  You will receive a call from a belly doctor in order to schedule this. If you have any questions or concerns, please do not hesitate to call the office at [phone].  Take Care,   Martinique Arris Meyn, DO

## 2018-12-12 NOTE — Progress Notes (Signed)
Subjective:  [name]  DOB: [birth year] MRN: [id number]  [name] is a 68 y.o. male with a PMH of HTN, HFrEF, h/o laryngeal carcinoma (2011, 2016), chronic pancreatitis, primary adrenal insufficiency, hypothyroidism, OA, H/o alcohol dependence, here today for f/u HTN.   HPI:  Hypertension  h/o HFrEF with improved EF - Patient has been out of his medications for 3 weeks.  Has not been checking his blood pressure at home however is elevated to 160/102 today in the office.  Remains elevated with orthostatics. - Previously was on amlodipine - Denies any SOB, CP, vision changes, LE edema, medication SEs, or symptoms of hypotension  Dizziness Patient reports that he is experiencing dizziness that is worse upon standing.  He reports that this is been happening for approximately 1 month.  He states that if he gets up slowly that the dizziness does not occur.  h/o laryngeal carcinoma (2011, 2016) s/p tracheostomy Patient does not remember the last time that he followed up with ENT.  He states he keeps to himself.  He is not still smoking but previously smoked. -Per chart review patient last followed up with ENT or radiation oncology 07/2015. -Patient is high risk for recurrence and at today's visit appears cachectic  Hypothyroidism Patient with elevated TSH in 2018 and dose was adjusted at that time.  Has not had repeat since normal TSH 02/2017. Will repeat TSH today.  Patient reports compliance with Synthroid  ROS:as mentioned in HPI  Family hx: Cardiovascular disease, Diabetes   Social hx: Denies use of illicit drugs, alcohol use Smoking status reviewed- h/o smoking and throat cancer, no longer smokes due to tracheostomy  Patient Active Problem List   Diagnosis Date Noted  . Dizziness 12/15/2018  . Status post total replacement of right hip 09/15/2016  . Throat cancer ([place])   . Esophageal mass   . Odynophagia   . Protein-calorie malnutrition,  severe ([place]) 04/10/2014  . Pancreatitis, chronic ([place]) 05/20/2013  . Primary adrenal insufficiency ([place]) 05/20/2013  . Hyperlipidemia   . Osteoarthritis   . Chronic systolic heart failure ([place]) 01/04/2012  . Alcohol dependence ([place]) 01/04/2012  . 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 Hypothyroidism 10/16/2010  . Malignant neoplasm of larynx ([place]) 01/10/2010  . PULMONARY NODULE 12/31/2007  . Essential hypertension, benign 11/08/2007     Objective:  BP (!) 160/102 Comment: left arm  Pulse (!) 54 Comment: right index finger  Wt 119 lb (54 kg)   SpO2 95%   BMI 17.57 kg/m   Vitals and nursing note reviewed  General: NAD, pleasant, s/p  Cardiac: RRR, normal heart sounds, no m/r/g Pulm: normal effort, CTAB Extremities: no edema or cyanosis. WWP. Skin: warm and dry, no rashes noted Neuro: alert and oriented, no focal deficits Psych: normal affect, normal thought content  Assessment & Plan:   Protein-calorie malnutrition, severe ([place]) Patient cachectic on exam today.  TSH low, will need to call and adjust dosage.  [name] weight is stable.  He would likely benefit from nutrition.  However given his history of throat cancer also important that patient follow-up with ENT to rule out recurrence  Throat cancer [facility]) Referral placed for ENT and patient encouraged to follow-up.  Essential hypertension, benign Patient has been out of his amlodipine.  He has previously been on lisinopril, hydralazine and amlodipine to treat his blood pressure.  BP elevated at 160/102 today in the office.  Patient reports that he has never  had an adverse reaction to any of the above medications.  We will start him on lisinopril 10 mg, given patient's personal history of HFrEF that has since resolved but there may still be benefit in initiating ACE therapy.  Patient will recheck his blood pressure at home and we will follow-up in 2 weeks, you will also  need to come in to have repeat BMP.  Hypothyroidism TSH low today.  Will need to adjust Synthroid dose and repeat TSH level in 6 weeks.  Dizziness Orthostatic vitals negative.  Patient advised to take time on getting up from a seated position.  Advised him to also try compression stockings.    Chronic systolic heart failure ([place]) [name] last echo 04/2014 with normal EF, however echo in 12/2011 with EF of 30%.  Patient currently having dizziness.  Will repeat echocardiogram as patient is currently not on proper treatment for previously diagnosed HFrEF  Health maintenance Patient received flu shot today and referral was placed for patient to have colonoscopy  Martin[place] Shante [place], [place] Resident PGY-3

## 2018-12-13 LAB — CBC
Hematocrit: 37 % — ABNORMAL LOW (ref 37.5–51.0)
Hemoglobin: 12.4 g/dL — ABNORMAL LOW (ref 13.0–17.7)
MCH: 28.4 pg (ref 26.6–33.0)
MCHC: 33.5 g/dL (ref 31.5–35.7)
MCV: 85 fL (ref 79–97)
Platelets: 288 10*3/uL (ref 150–450)
RBC: 4.37 x10E6/uL (ref 4.14–5.80)
RDW: 12.3 % (ref 11.6–15.4)
WBC: 5.4 10*3/uL (ref 3.4–10.8)

## 2018-12-13 LAB — COMPREHENSIVE METABOLIC PANEL
ALT: 13 IU/L (ref 0–44)
AST: 19 IU/L (ref 0–40)
Albumin/Globulin Ratio: 1 — ABNORMAL LOW (ref 1.2–2.2)
Albumin: 3.9 g/dL (ref 3.8–4.8)
Alkaline Phosphatase: 104 IU/L (ref 39–117)
BUN/Creatinine Ratio: 8 — ABNORMAL LOW (ref 10–24)
BUN: 9 mg/dL (ref 8–27)
Bilirubin Total: 0.7 mg/dL (ref 0.0–1.2)
CO2: 21 mmol/L (ref 20–29)
Calcium: 9.3 mg/dL (ref 8.6–10.2)
Chloride: 101 mmol/L (ref 96–106)
Creatinine, Ser: 1.14 mg/dL (ref 0.76–1.27)
GFR calc Af Amer: 77 mL/min/{1.73_m2} (ref >59)
GFR calc non Af Amer: 67 mL/min/{1.73_m2} (ref >59)
Globulin, Total: 4 g/dL (ref 1.5–4.5)
Glucose: 90 mg/dL (ref 65–99)
Potassium: 4.6 mmol/L (ref 3.5–5.2)
Sodium: 138 mmol/L (ref 134–144)
Total Protein: 7.9 g/dL (ref 6.0–8.5)

## 2018-12-13 LAB — MAGNESIUM: Magnesium: 1.7 mg/dL (ref 1.6–2.3)

## 2018-12-13 LAB — TSH: TSH: 0.032 u[IU]/mL — ABNORMAL LOW (ref 0.450–4.500)

## 2018-12-15 ENCOUNTER — Encounter: Payer: Self-pay | Admitting: Family Medicine

## 2018-12-15 DIAGNOSIS — R42 Dizziness and giddiness: Secondary | ICD-10-CM | POA: Insufficient documentation

## 2018-12-15 NOTE — Assessment & Plan Note (Signed)
[name] last echo 04/2014 with normal EF, however echo in 12/2011 with EF of 30%.  Patient currently having dizziness.  Will repeat echocardiogram as patient is currently not on proper treatment for previously diagnosed HFrEF

## 2018-12-15 NOTE — Assessment & Plan Note (Signed)
TSH low today.  Will need to adjust Synthroid dose and repeat TSH level in 6 weeks.

## 2018-12-15 NOTE — Assessment & Plan Note (Signed)
Referral placed for ENT and patient encouraged to follow-up.

## 2018-12-15 NOTE — Assessment & Plan Note (Signed)
Orthostatic vitals negative.  Patient advised to take time on getting up from a seated position.  Advised him to also try compression stockings.

## 2018-12-15 NOTE — Assessment & Plan Note (Addendum)
Patient has been out of his amlodipine.  He has previously been on lisinopril, hydralazine and amlodipine to treat his blood pressure.  BP elevated at 160/102 today in the office.  Patient reports that he has never had an adverse reaction to any of the above medications.  We will start him on lisinopril 10 mg, given patient's personal history of HFrEF that has since resolved but there may still be benefit in initiating ACE therapy.  Patient will recheck his blood pressure at home and we will follow-up in 2 weeks, you will also need to come in to have repeat BMP.

## 2018-12-15 NOTE — Assessment & Plan Note (Signed)
Patient cachectic on exam today.  TSH low, will need to call and adjust dosage.  [name] weight is stable.  He would likely benefit from nutrition.  However given his history of throat cancer also important that patient follow-up with ENT to rule out recurrence

## 2018-12-16 ENCOUNTER — Other Ambulatory Visit: Payer: Self-pay

## 2018-12-16 ENCOUNTER — Ambulatory Visit (HOSPITAL_COMMUNITY)
Admission: RE | Admit: 2018-12-16 | Discharge: 2018-12-16 | Disposition: A | Discharge status: Home / Self Care | Payer: Medicare Other | Source: Ambulatory Visit | Attending: Family Medicine | Admitting: Family Medicine

## 2018-12-16 DIAGNOSIS — I110 Hypertensive heart disease with heart failure: Secondary | ICD-10-CM | POA: Diagnosis not present

## 2018-12-16 DIAGNOSIS — I5022 Chronic systolic (congestive) heart failure: Secondary | ICD-10-CM | POA: Insufficient documentation

## 2018-12-16 DIAGNOSIS — E785 Hyperlipidemia, unspecified: Secondary | ICD-10-CM | POA: Diagnosis not present

## 2018-12-16 DIAGNOSIS — E119 Type 2 diabetes mellitus without complications: Secondary | ICD-10-CM | POA: Diagnosis not present

## 2018-12-16 DIAGNOSIS — I083 Combined rheumatic disorders of mitral, aortic and tricuspid valves: Secondary | ICD-10-CM | POA: Insufficient documentation

## 2018-12-16 NOTE — Progress Notes (Signed)
  Echocardiogram 2D Echocardiogram has been performed.  Jennette Dubin 12/16/2018, 9:55 AM

## 2018-12-24 ENCOUNTER — Other Ambulatory Visit: Payer: Self-pay | Admitting: Family Medicine

## 2018-12-24 MED ORDER — LEVOTHYROXINE SODIUM 137 MCG PO TABS: 137.0000 ug | ORAL_TABLET | Freq: Every day | ORAL | 1 refills | Status: DC

## 2018-12-31 DIAGNOSIS — C321 Malignant neoplasm of supraglottis: Secondary | ICD-10-CM | POA: Diagnosis not present

## 2018-12-31 DIAGNOSIS — C140 Malignant neoplasm of pharynx, unspecified: Secondary | ICD-10-CM | POA: Diagnosis not present

## 2018-12-31 DIAGNOSIS — C102 Malignant neoplasm of lateral wall of oropharynx: Secondary | ICD-10-CM | POA: Diagnosis not present

## 2019-01-17 ENCOUNTER — Encounter: Payer: Self-pay | Admitting: Family Medicine

## 2019-06-11 ENCOUNTER — Ambulatory Visit (INDEPENDENT_AMBULATORY_CARE_PROVIDER_SITE_OTHER): Payer: Medicare Other | Admitting: Family Medicine

## 2019-06-11 ENCOUNTER — Other Ambulatory Visit: Payer: Self-pay

## 2019-06-11 VITALS — BP 110/72 | HR 105 | Wt 114.4 lb

## 2019-06-11 DIAGNOSIS — R634 Abnormal weight loss: Secondary | ICD-10-CM

## 2019-06-11 DIAGNOSIS — R200 Anesthesia of skin: Secondary | ICD-10-CM | POA: Diagnosis not present

## 2019-06-11 DIAGNOSIS — R42 Dizziness and giddiness: Secondary | ICD-10-CM | POA: Diagnosis not present

## 2019-06-11 NOTE — Progress Notes (Signed)
    SUBJECTIVE:   CHIEF COMPLAINT / HPI:   R hand numbness One month in duration No exacerbating or alleviating factors Weakness occasionally Has never occurred before No pain Involves his whole hand including all of his fingers, does not radiate to his forearm  Presyncope Worse when going from sitting to standing Has a poor appetite and has difficulty swallowing food due to his tracheostomy Has had multiple episodes where he has "blacked out" and fallen but has not sustained any injuries Reports drinking 1 pint of liquor per week Is taking lisinopril for blood pressure Does not have difficulty walking or have a sense of imbalance  PERTINENT  PMH / PSH: Hypothyroidism, history of laryngeal cancer, liver cirrhosis, hypertension  OBJECTIVE:   BP 110/72   Pulse (!) 105   Wt 114 lb 6.4 oz (51.9 kg)   SpO2 98%   BMI 16.89 kg/m   General: Appears frail and emaciated with bitemporal wasting Cardiac: RRR, no MRG Respiratory: CTAB, no rhonchi, rales, or wheezing, normal work of breathing Right hand: No visual abnormality of the right hand, normal ROM of the wrist and fingers, 5/5 strength on wrist flexion, extension, finger abduction and opposition, Tinel's and Phalen sign negative   ASSESSMENT/PLAN:   Dizziness Orthostatic vital signs are positive today.  Patient has also had a 5 pound weight loss since his last visit a few months ago.  Therefore, his dizziness is likely due to protein calorie malnutrition, alcoholism, dehydration.  Patient was advised to work on hydration, drink 3 ensures per day to help him gain some weight, and to get fitted compression stockings.  We will check a CBC.  We will also check a TSH and T4 today since his previous TSH was low.    Numbness of right hand History seems consistent with carpal tunnel, but exam does not correlate with this today.  Will try a wrist brace to see if this will help.  Hypothyroidism can also increase the risk for carpal  tunnel, so we will check a TSH and free T4 today.     Kathrene Alu, MD [place]

## 2019-06-11 NOTE — Patient Instructions (Addendum)
It was nice meeting you today [name]!  Your dizziness and blackout spells are likely due to low blood pressure that is worse when you go from sitting to standing.  I would like for you to get measured for support stockings at [facility] on [address].  I would also like for you to try drinking Ensure for extra calories and protein.  We are also going to check a blood count and thyroid test today.  Your right hand numbness could be due to several things, but we will try to treat possible carpal tunnel with a wrist brace.  This brace should keep your wrist from flexing, which should help decrease the pressure on the nerve going into your hand.  You can wear this at nighttime or 24/7 if that is better for you.  If you have any questions or concerns, please feel free to call the clinic.   Be well,  Dr. Shan Levans

## 2019-06-12 DIAGNOSIS — R200 Anesthesia of skin: Secondary | ICD-10-CM | POA: Insufficient documentation

## 2019-06-12 LAB — CBC
Hematocrit: 33.5 % — ABNORMAL LOW (ref 37.5–51.0)
Hemoglobin: 12.1 g/dL — ABNORMAL LOW (ref 13.0–17.7)
MCH: 31.1 pg (ref 26.6–33.0)
MCHC: 36.1 g/dL — ABNORMAL HIGH (ref 31.5–35.7)
MCV: 86 fL (ref 79–97)
Platelets: 254 10*3/uL (ref 150–450)
RBC: 3.89 x10E6/uL — ABNORMAL LOW (ref 4.14–5.80)
RDW: 15.2 % (ref 11.6–15.4)
WBC: 5 10*3/uL (ref 3.4–10.8)

## 2019-06-12 LAB — TSH+FREE T4
Free T4: 1.53 ng/dL (ref 0.82–1.77)
TSH: 0.033 u[IU]/mL — ABNORMAL LOW (ref 0.450–4.500)

## 2019-06-12 NOTE — Assessment & Plan Note (Addendum)
Orthostatic vital signs are positive today.  Patient has also had a 5 pound weight loss since his last visit a few months ago.  Therefore, his dizziness is likely due to protein calorie malnutrition, alcoholism, dehydration.  Patient was advised to work on hydration, drink 3 ensures per day to help him gain some weight, and to get fitted compression stockings.  We will check a CBC.  We will also check a TSH and T4 today since his previous TSH was low.

## 2019-06-12 NOTE — Assessment & Plan Note (Signed)
History seems consistent with carpal tunnel, but exam does not correlate with this today.  Will try a wrist brace to see if this will help.  Hypothyroidism can also increase the risk for carpal tunnel, so we will check a TSH and free T4 today.

## 2019-06-16 ENCOUNTER — Other Ambulatory Visit: Payer: Self-pay | Admitting: Family Medicine

## 2019-06-16 DIAGNOSIS — D649 Anemia, unspecified: Secondary | ICD-10-CM

## 2019-06-16 NOTE — Progress Notes (Signed)
Left a voice message on Mr. Blocker daughter's phone letting her know that while his T4 was normal, his TSH was low, so I want to make sure that he is taking his levothyroxine correctly.  We will recheck this in 6 to 8 weeks.  I also let her know that he appears to be chronically anemic, and I noticed that he has not had a colonoscopy according to our records.  I told her that I would place an order for this, and I would like for her to call either [facility] or [facility] to set up a colonoscopy appointment for him.  I encouraged her to call if she has any questions or concerns.

## 2019-06-25 ENCOUNTER — Encounter: Payer: Self-pay | Admitting: Physician Assistant

## 2019-07-03 ENCOUNTER — Emergency Department (HOSPITAL_COMMUNITY): Payer: Medicare Other

## 2019-07-03 ENCOUNTER — Other Ambulatory Visit: Payer: Self-pay

## 2019-07-03 ENCOUNTER — Emergency Department (HOSPITAL_COMMUNITY)
Admission: EM | Admit: 2019-07-03 | Discharge: 2019-07-03 | Disposition: A | Discharge status: Home / Self Care | Payer: Medicare Other | Attending: Emergency Medicine | Admitting: Emergency Medicine

## 2019-07-03 ENCOUNTER — Encounter: Payer: Self-pay | Admitting: Family Medicine

## 2019-07-03 ENCOUNTER — Encounter (HOSPITAL_COMMUNITY): Payer: Self-pay | Admitting: Emergency Medicine

## 2019-07-03 ENCOUNTER — Ambulatory Visit (INDEPENDENT_AMBULATORY_CARE_PROVIDER_SITE_OTHER): Payer: Medicare Other | Admitting: Family Medicine

## 2019-07-03 DIAGNOSIS — R531 Weakness: Secondary | ICD-10-CM | POA: Insufficient documentation

## 2019-07-03 DIAGNOSIS — G40909 Epilepsy, unspecified, not intractable, without status epilepticus: Secondary | ICD-10-CM | POA: Diagnosis not present

## 2019-07-03 DIAGNOSIS — E039 Hypothyroidism, unspecified: Secondary | ICD-10-CM | POA: Diagnosis not present

## 2019-07-03 DIAGNOSIS — I110 Hypertensive heart disease with heart failure: Secondary | ICD-10-CM | POA: Diagnosis not present

## 2019-07-03 DIAGNOSIS — R569 Unspecified convulsions: Secondary | ICD-10-CM | POA: Insufficient documentation

## 2019-07-03 DIAGNOSIS — Z8521 Personal history of malignant neoplasm of larynx: Secondary | ICD-10-CM | POA: Diagnosis not present

## 2019-07-03 DIAGNOSIS — I10 Essential (primary) hypertension: Secondary | ICD-10-CM | POA: Diagnosis not present

## 2019-07-03 DIAGNOSIS — D320 Benign neoplasm of cerebral meninges: Secondary | ICD-10-CM | POA: Diagnosis not present

## 2019-07-03 DIAGNOSIS — I5022 Chronic systolic (congestive) heart failure: Secondary | ICD-10-CM | POA: Diagnosis not present

## 2019-07-03 LAB — COMPREHENSIVE METABOLIC PANEL
ALT: 30 U/L (ref 0–44)
AST: 40 U/L (ref 15–41)
Albumin: 3.7 g/dL (ref 3.5–5.0)
Alkaline Phosphatase: 93 U/L (ref 38–126)
Anion gap: 9 (ref 5–15)
BUN: 12 mg/dL (ref 8–23)
CO2: 24 mmol/L (ref 22–32)
Calcium: 9.3 mg/dL (ref 8.9–10.3)
Chloride: 103 mmol/L (ref 98–111)
Creatinine, Ser: 1.47 mg/dL — ABNORMAL HIGH (ref 0.61–1.24)
GFR calc Af Amer: 57 mL/min — ABNORMAL LOW (ref >60)
GFR calc non Af Amer: 49 mL/min — ABNORMAL LOW (ref >60)
Glucose, Bld: 104 mg/dL — ABNORMAL HIGH (ref 70–99)
Potassium: 4.6 mmol/L (ref 3.5–5.1)
Sodium: 136 mmol/L (ref 135–145)
Total Bilirubin: 1.9 mg/dL — ABNORMAL HIGH (ref 0.3–1.2)
Total Protein: 8.4 g/dL — ABNORMAL HIGH (ref 6.5–8.1)

## 2019-07-03 LAB — I-STAT CHEM 8, ED
BUN: 13 mg/dL (ref 8–23)
Calcium, Ion: 1.16 mmol/L (ref 1.15–1.40)
Chloride: 103 mmol/L (ref 98–111)
Creatinine, Ser: 1.3 mg/dL — ABNORMAL HIGH (ref 0.61–1.24)
Glucose, Bld: 95 mg/dL (ref 70–99)
HCT: 40 % (ref 39.0–52.0)
Hemoglobin: 13.6 g/dL (ref 13.0–17.0)
Potassium: 4.6 mmol/L (ref 3.5–5.1)
Sodium: 139 mmol/L (ref 135–145)
TCO2: 25 mmol/L (ref 22–32)

## 2019-07-03 LAB — DIFFERENTIAL
Abs Immature Granulocytes: 0.02 10*3/uL (ref 0.00–0.07)
Basophils Absolute: 0 10*3/uL (ref 0.0–0.1)
Basophils Relative: 1 %
Eosinophils Absolute: 0.1 10*3/uL (ref 0.0–0.5)
Eosinophils Relative: 1 %
Immature Granulocytes: 0 %
Lymphocytes Relative: 31 %
Lymphs Abs: 1.7 10*3/uL (ref 0.7–4.0)
Monocytes Absolute: 0.9 10*3/uL (ref 0.1–1.0)
Monocytes Relative: 16 %
Neutro Abs: 2.8 10*3/uL (ref 1.7–7.7)
Neutrophils Relative %: 51 %

## 2019-07-03 LAB — CBC
HCT: 36.4 % — ABNORMAL LOW (ref 39.0–52.0)
Hemoglobin: 11.6 g/dL — ABNORMAL LOW (ref 13.0–17.0)
MCH: 30.3 pg (ref 26.0–34.0)
MCHC: 31.9 g/dL (ref 30.0–36.0)
MCV: 95 fL (ref 80.0–100.0)
Platelets: 273 10*3/uL (ref 150–400)
RBC: 3.83 MIL/uL — ABNORMAL LOW (ref 4.22–5.81)
RDW: 16.2 % — ABNORMAL HIGH (ref 11.5–15.5)
WBC: 5.5 10*3/uL (ref 4.0–10.5)
nRBC: 0 % (ref 0.0–0.2)

## 2019-07-03 LAB — APTT: aPTT: 30 s (ref 24–36)

## 2019-07-03 LAB — PROTIME-INR
INR: 1.2 (ref 0.8–1.2)
Prothrombin Time: 14.3 seconds (ref 11.4–15.2)

## 2019-07-03 LAB — CBG MONITORING, ED: Glucose-Capillary: 90 mg/dL (ref 70–99)

## 2019-07-03 MED ORDER — LEVETIRACETAM 100 MG/ML PO SOLN: 500.0000 mg | Freq: Two times a day (BID) | ORAL | 0 refills | Status: DC

## 2019-07-03 MED ORDER — LORAZEPAM 2 MG/ML IJ SOLN
1.0000 mg | Freq: Once | INTRAMUSCULAR | Status: AC
  Administered 2019-07-03: 1 mg via INTRAVENOUS
  Filled 2019-07-03: qty 1

## 2019-07-03 MED ORDER — SODIUM CHLORIDE 0.9% FLUSH
3.0000 mL | Freq: Once | INTRAVENOUS | Status: DC
Start: 2019-07-03 — End: 2019-07-04

## 2019-07-03 MED ORDER — LEVETIRACETAM IN NACL 1000 MG/100ML IV SOLN
1000.0000 mg | Freq: Once | INTRAVENOUS | Status: AC
  Administered 2019-07-03: 23:00:00 1000 mg via INTRAVENOUS
  Filled 2019-07-03: qty 100

## 2019-07-03 NOTE — ED Provider Notes (Signed)
[facility] EMERGENCY DEPARTMENT Provider Note   CSN: [id number] Arrival date & time: 07/03/19  1702     History Chief Complaint  Patient presents with  . Weakness    [name] is a 69 y.o. male.  Patient presents to the emergency department with a chief complaint of intermittent episodes of right sided weakness.  Per his daughter, the patient has been having increasingly frequent episodes of right upper and lower extremity weakness.  She states that he has a remote history of seizures, but no longer takes anything for his seizures.  She states that over the past week, he has had almost daily spells of weakness in his right-sided extremities.  She reports seeing some twitching in his fingers, which is followed by weakness and flaccidity in his right upper and lower extremities.  She states that flaccidity over last a short while before he regains his strength.  His daughter reports that he has to drag his leg.  She is concerned about him falling.  The patient was seen by his primary care doctor with family practice today, and was referred to the emergency department for evaluation of the weakness.  PCP concern for TIAs versus seizure.  The history is provided by the patient. No language interpreter was used.       Past Medical History:  Diagnosis Date  . Alcohol abuse   . Arthritis   . Avascular necrosis of hip, right ([place]) 08/09/2016  . CHF (congestive heart failure) ([place])    EF 45-50% 05/2012  . Cirrhosis of liver ([place])   . Complication of anesthesia    SUCCINYLCHOLINE ADVERSE REACTION (not an allergy) Following 1st attempt to place tracheotomy tube in 2011 at [facility]; patient went into "succinylcholine induced pulseless electrical activity secondary to probable hyperkalemia" and underwent chest compressions and placement of endotracheal tube with admission to medical ICU.  EXTREMELY HIGH SEVERE REACTION.   Marland Kitchen Hyperlipidemia   . Hypertension     . Hypothyroidism   . Laryngeal cancer ([place]) 2011   Laryngectomy, [id number]  . Seizures [facility])     Patient Active Problem List   Diagnosis Date Noted  . Right sided weakness 07/03/2019  . Numbness of right hand 06/12/2019  . Dizziness 12/15/2018  . Status post total replacement of right hip 09/15/2016  . Throat cancer ([place])   . Esophageal mass   . Odynophagia   . Protein-calorie malnutrition, severe ([place]) 04/10/2014  . Pancreatitis, chronic ([place]) 05/20/2013  . Primary adrenal insufficiency ([place]) 05/20/2013  . Hyperlipidemia   . Osteoarthritis   . Chronic systolic heart failure ([place]) 01/04/2012  . Alcohol dependence ([place]) 01/04/2012  . 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 Hypothyroidism 10/16/2010  . Malignant neoplasm of larynx ([place]) 01/10/2010  . PULMONARY NODULE 12/31/2007  . Essential hypertension, benign 11/08/2007    Past Surgical History:  Procedure Laterality Date  . ESOPHAGOGASTRODUODENOSCOPY N/A 04/10/2014   Procedure: ESOPHAGOGASTRODUODENOSCOPY (EGD);  Surgeon: Missy Sabins, MD;  Location: [facility] ENDOSCOPY;  Service: Endoscopy;  Laterality: N/A;  . LEFT AND RIGHT HEART CATHETERIZATION WITH CORONARY ANGIOGRAM N/A 12/28/2011   Procedure: LEFT AND RIGHT HEART CATHETERIZATION WITH CORONARY ANGIOGRAM;  Surgeon: Jolaine Artist, MD;  Location: [facility] CATH LAB;  Service: Cardiovascular;  Laterality: N/A;  . TOTAL HIP ARTHROPLASTY Right 09/15/2016   Procedure: RIGHT TOTAL HIP ARTHROPLASTY ANTERIOR APPROACH;  Surgeon: Mcarthur Rossetti, MD;  Location: [facility] ORS;  Service: Orthopedics;  Laterality: Right;  .  TRACHEAL SURGERY    . TRACHEOSTOMY         Family History  Problem Relation Age of Onset  . Hyperlipidemia Mother   . Hypertension Mother   . Hypertension Father   . Hyperlipidemia Father     Social History   Tobacco Use  . Smoking status: Former Smoker    Packs/day: 0.20    Years: 50.00    Pack years: 10.00     Types: Cigarettes  . Smokeless tobacco: Former Systems developer    Types: Chew  Substance Use Topics  . Alcohol use: Yes    Comment: often   . Drug use: Yes    Types: Marijuana    Comment: once a month    Home Medications Prior to Admission medications   Medication Sig Start Date End Date Taking? Authorizing Provider  aspirin EC 81 MG tablet Take 1 tablet (81 mg total) by mouth 2 (two) times daily after a meal. Patient taking differently: Take 81 mg by mouth daily.  12/12/18  Yes Enid Derry, [place], DO  diclofenac sodium (VOLTAREN) 1 % GEL Apply 2 g topically 3 (three) times daily as needed (hip pain/back pain). 07/28/16  Yes Smiley Houseman, MD  levothyroxine (SYNTHROID) 137 MCG tablet Take 1 tablet (137 mcg total) by mouth daily before breakfast. 12/24/18  Yes Enid Derry, [place], DO  traMADol (ULTRAM) 50 MG tablet Take 2 tablets (100 mg total) by mouth every 6 (six) hours as needed. Patient taking differently: Take 100 mg by mouth every 6 (six) hours as needed for moderate pain.  09/28/16  Yes Mcarthur Rossetti, MD  Alum & Mag Hydroxide-Simeth (MAGIC MOUTHWASH W/LIDOCAINE) SOLN Take 5 mLs by mouth 4 (four) times daily. Patient not taking: Reported on 07/03/2019 05/07/14   Frazier Richards, MD  guaiFENesin (MUCINEX) 600 MG 12 hr tablet Take 1 tablet (600 mg total) by mouth 2 (two) times daily. Patient not taking: Reported on 07/03/2019 05/26/14   Howard Pouch A, DO    Allergies    Succinylcholine, Tylenol [acetaminophen], Vicodin [hydrocodone-acetaminophen], Other, and Protonix [pantoprazole]  Review of Systems   Review of Systems  All other systems reviewed and are negative.   Physical Exam Updated Vital Signs BP (!) 159/108 (BP Location: Right Arm)   Pulse 90   Temp 98.3 F (36.8 C) (Oral)   Resp 18   Ht 5\' 9"  (1.753 m)   Wt 52 kg   SpO2 100%   BMI 16.93 kg/m   Physical Exam Vitals and nursing note reviewed.  Constitutional:      Appearance: He is well-developed.  HENT:      Head: Normocephalic and atraumatic.  Eyes:     Conjunctiva/sclera: Conjunctivae normal.  Neck:     Comments: Stoma Cardiovascular:     Rate and Rhythm: Normal rate and regular rhythm.     Heart sounds: No murmur.  Pulmonary:     Effort: Pulmonary effort is normal. No respiratory distress.     Breath sounds: Normal breath sounds.  Abdominal:     Palpations: Abdomen is soft.     Tenderness: There is no abdominal tenderness.  Musculoskeletal:        General: Normal range of motion.     Cervical back: Neck supple.     Comments: Slightly diminished grip strength in the right hand, but otherwise strength of bilateral upper and lower extremities is equal  Skin:    General: Skin is warm and dry.  Neurological:     Mental Status:  He is alert and oriented to person, place, and time.  Psychiatric:        Mood and Affect: Mood normal.        Behavior: Behavior normal.     ED Results / Procedures / Treatments   Labs (all labs ordered are listed, but only abnormal results are displayed) Labs Reviewed  CBC - Abnormal; Notable for the following components:      Result Value   RBC 3.83 (*)    Hemoglobin 11.6 (*)    HCT 36.4 (*)    RDW 16.2 (*)    All other components within normal limits  COMPREHENSIVE METABOLIC PANEL - Abnormal; Notable for the following components:   Glucose, Bld 104 (*)    Creatinine, Ser 1.47 (*)    Total Protein 8.4 (*)    Total Bilirubin 1.9 (*)    GFR calc non Af Amer 49 (*)    GFR calc Af Amer 57 (*)    All other components within normal limits  I-STAT CHEM 8, ED - Abnormal; Notable for the following components:   Creatinine, Ser 1.30 (*)    All other components within normal limits  PROTIME-INR  APTT  DIFFERENTIAL  CBG MONITORING, ED    EKG EKG Interpretation  Date/Time:  Thursday July 03 2019 17:26:26 EDT Ventricular Rate:  90 PR Interval:  166 QRS Duration: 76 QT Interval:  350 QTC Calculation: 428 R Axis:   16 Text Interpretation: Normal  sinus rhythm Normal ECG When compared with ECG of 02/12/2018, No significant change was found Confirmed by Delora Fuel ([id number]) on 07/03/2019 9:27:59 PM   Radiology CT HEAD WO CONTRAST  Result Date: 07/03/2019 CLINICAL DATA:  Right-sided weakness EXAM: CT HEAD WITHOUT CONTRAST TECHNIQUE: Contiguous axial images were obtained from the base of the skull through the vertex without intravenous contrast. COMPARISON:  CT brain 05/29/2017 FINDINGS: Brain: No acute territorial infarction or hemorrhage is visualized. Mildly dense extra-axial subfrontal mass measuring 15 x 16 mm consistent with meningioma. This does not appear significantly changed. Mild atrophy. Mild hypodensity in the white matter consistent with chronic small vessel ischemic change. Chronic lacunar infarcts within the white matter. Stable ventricle size Vascular: No hyperdense vessels. Vertebral and carotid vascular calcification Skull: Normal. Negative for fracture or focal lesion. Sinuses/Orbits: Chronic fracture of the medial wall right orbit Other: None IMPRESSION: 1. No CT evidence for acute intracranial abnormality. 2. Atrophy and mild chronic small vessel ischemic change. 3. Stable 15 x 16 mm subfrontal meningioma Electronically Signed   By: Donavan Foil M.D.   On: 07/03/2019 19:39    Procedures Procedures (including critical care time)  Medications Ordered in ED Medications  sodium chloride flush (NS) 0.9 % injection 3 mL (has no administration in time range)  levETIRAcetam (KEPPRA) IVPB 1000 mg/100 mL premix (1,000 mg Intravenous New Bag/Given 07/03/19 2312)  LORazepam (ATIVAN) injection 1 mg (1 mg Intravenous Given 07/03/19 2312)    ED Course  I have reviewed the triage vital signs and the nursing notes.  Pertinent labs & imaging results that were available during my care of the patient were reviewed by me and considered in my medical decision making (see chart for details).    MDM Rules/Calculators/A&P                        This patient complains of weakness in right upper and lower extremities, this involves an extensive number of treatment options, and is a complaint that  carries with it a high risk of complications and morbidity.  The differential diagnosis includes TIA, stroke, seizure.  I ordered, reviewed, and interpreted labs, which included CBC, CMP notable for no leukocytosis, mildly elevated creatinine at 1.47. CBG 90. I ordered medication Keppra and Ativan for seizure. I ordered imaging studies which included CT and I independently visualized and interpreted imaging which showed no evidence of acute intracranial abnormality.  There is a stable 15 x 16 mm subfrontal meningioma. Additional history obtained from daughter, healthcare power of attorney. Previous records obtained and reviewed and show possible old seizure focus of meningioma. I consulted Dr. Leonel Ramsay and discussed pertinent lab and imaging findings.  Dr. Leonel Ramsay reassured by description of twitching in the hand followed by the flaccid right-sided extremities.  Believe symptoms to be seizure and not TIA.  Recommends 1000 mg Keppra loading dose in the ED and 500 mg Keppra twice daily.  Also recommends 1 mg of Ativan.  I also discussed the case with the family practice team, who are agreeable with seeing the patient in follow-up in clinic.  Appointment scheduled for May 4 at 3:50 PM.     After the interventions stated above, I reevaluated the patient and found patient and daughter agreeable with plan and stable for discharge.  Case discussed with Dr. Roslynn Amble, who agrees to plan.   Final Clinical Impression(s) / ED Diagnoses Final diagnoses:  Seizure-like activity [facility])    Rx / DC Orders ED Discharge Orders         Ordered    levETIRAcetam (KEPPRA) 100 MG/ML solution  2 times daily     07/03/19 2343           Damarien, [place], PA-C 07/03/19 2344    Lucrezia Starch, MD 07/04/19 [phone]

## 2019-07-03 NOTE — Progress Notes (Signed)
   SUBJECTIVE:   CHIEF COMPLAINT / HPI:   Right sided numbness and weakness with some shaking episodes: [name] daughter accompanies him today after coming to the office on 4/7 and not receiving the full visit information.  Daughter reports that 3 to 4 days out of the week for the past few months he has been experiencing episodes where his entire right side of his body will go limp.  She reports that his hand will twitch occasionally.  She states that the last few minutes and she will have to hold him up and put him in a chair.  She states that then he will be able to slowly walk and regain his strength.  Patient does endorse that his right side is still having some residual weakness.  They report that his last episode was today at 1 PM.  Daughter is quite worried about him.  She reports that he has a history of epilepsy which he had while he was dealing with throat and mouth cancer.  She states that after this cancer is resolved that his seizures decreased in frequency and he was eventually weaned off.  She states that his last seizure was over a year ago.  She reports they have not followed up with neurology.  Patient does not have any known history of strokes.  He does drink alcohol very regularly but has not recently decreased the amount of alcohol that he has been taking in.   PMH of HTN, HFrEF, h/o laryngeal carcinoma (2011, 2016), chronic pancreatitis, primary adrenal insufficiency, hypothyroidism, OA, H/o alcohol dependence  OBJECTIVE:  BP (!) 152/92   Pulse 99   Ht 5\' 9"  (1.753 m)   Wt 115 lb (52.2 kg)   SpO2 99%   BMI 16.98 kg/m   General: NAD, pleasant, stoma used for speaking Cardiovascular: RRR, no m/r/g, no LE edema Respiratory: normal work of breathing Neuro: CN II-XII grossly intact; RLE with 3/5 strength and LLE with 5/5 strength.  RUE with 3/5 strength in LUE with 5/5 strength. Psych: AOx3, appropriate affect  ASSESSMENT/PLAN:   Right sided weakness Patient with a  couple months of increasing in frequency episodes where her right side will go weak with some numbness.  [name] daughter also reports some episodes of tremors in his hand and reports that patient appears dazed during episode.  Patient with stoma so unclear if he is having speech issues.  Patient escorted to the ED given significant right-sided weakness noted on exam.  Will need to rule out whether or not patient is having multiple TIAs vs CVA vs recurrence of seizures.  Patient will need MRI of brain likely.  Would also benefit from neurology consult and possible EEG.   [place] Crystal Ellwood, DO PGY-3, [facility]

## 2019-07-03 NOTE — Assessment & Plan Note (Addendum)
Patient with a couple months of increasing in frequency episodes where her right side will go weak with some numbness.  [name] daughter also reports some episodes of tremors in his hand and reports that patient appears dazed during episode.  Patient with stoma so unclear if he is having speech issues.  Patient escorted to the ED given significant right-sided weakness noted on exam.  Will need to rule out whether or not patient is having multiple TIAs vs CVA vs recurrence of seizures.  Patient will need MRI of brain likely.  Would also benefit from neurology consult and possible EEG.

## 2019-07-03 NOTE — ED Triage Notes (Signed)
Pt arrives with daughter from [facility] to be evaluated for intermittent right sided weakness that has been going on for a few months. Pt as stoma. Endorses right sided headache. Family reports multiple falls at home.

## 2019-07-07 ENCOUNTER — Telehealth: Payer: Self-pay

## 2019-07-07 NOTE — Telephone Encounter (Signed)
Attempted to reach pts daughter to remind of  appt for Mr. Hafen tomorrow 07/08/19  at 3:50pm.  LVM. Salvatore Marvel, CMA

## 2019-07-08 ENCOUNTER — Encounter: Payer: Self-pay | Admitting: Family Medicine

## 2019-07-08 ENCOUNTER — Ambulatory Visit (INDEPENDENT_AMBULATORY_CARE_PROVIDER_SITE_OTHER): Payer: Medicare Other | Admitting: Family Medicine

## 2019-07-08 ENCOUNTER — Ambulatory Visit: Payer: Medicare Other | Admitting: Family Medicine

## 2019-07-08 ENCOUNTER — Other Ambulatory Visit: Payer: Self-pay

## 2019-07-08 VITALS — BP 158/96 | HR 88 | Ht 69.0 in | Wt 113.4 lb

## 2019-07-08 DIAGNOSIS — R531 Weakness: Secondary | ICD-10-CM

## 2019-07-08 DIAGNOSIS — R569 Unspecified convulsions: Secondary | ICD-10-CM | POA: Diagnosis not present

## 2019-07-08 NOTE — Progress Notes (Signed)
   SUBJECTIVE:   CHIEF COMPLAINT / HPI:   Seizure-like actviity: CT head on 4/29 showed: There is a stable 15 x 16 mm subfrontal meningioma. Neurology consulted in the ED who thought this was due to seizures vs stroke etiology and recommended keppra load with 1000mg  and then continuing with 500mg  twice daily. Daughter reports he is still having episodes and they have not been given any follow up with neurology. They are quite upset about their ED experience. Patient reports he had another episode yesterday. Nothing today. Patient also is a heavy drinker and daughter reports it is better if she knows as in the past when he has tried to quit, he will hide his drinking and then fall around the house so they keep updated on what's happening   Patient reports his strength and gate are good today. They are only weakened during his episodes.   PERTINENT  PMH / PSH: HTN, HFrEF,h/olaryngeal carcinoma (2011, 2016),chronic pancreatitis, primary adrenal insufficiency, hypothyroidism, OA, H/oalcohol dependence, h/o seizure disorder  OBJECTIVE:  BP (!) 158/96   Pulse 88   Ht 5\' 9"  (1.753 m)   Wt 113 lb 6.4 oz (51.4 kg)   SpO2 99%   BMI 16.75 kg/m   General: NAD, pleasant, frail, speaking through stoma Respiratory: normal work of breathing Neuro: CN II-XII grossly intact, strength 5/5 in BLLE and BLUE Psych: AOx3, appropriate affect  ASSESSMENT/PLAN:   Seizure-like activity ([place]) Patient seen in ED and neuro presumed seizures given hx of arm twitching and patient's prior hx of seizures. He ia also an alcoholic, actively drinking. He has continued keppra after being started in ED but reports continued seizures. Patient did not receive any head imaging in ED to rule out other etiology. CT head stable from prior. Today patient with strength 5/5 in BLUE and BLLE, whereas at last office visit had 3/5 strength in RUE and RLE on exam that was markedly different. Called neuro to have appointment set up for  patient to be seen as soon as possible to help get seizures under better control.     [place] Rhiannan Kievit, DO PGY-3, [facility]

## 2019-07-08 NOTE — Patient Instructions (Signed)
Thank you for coming to see me today. It was a pleasure! Today we talked about:   I have placed an urgent referral to the neurologist.  I will call tomorrow in order to schedule an appointment as they did not answer me this afternoon.  We have also rechecked his blood work to ensure that his kidney function has improved.  Please follow-up with me in 6 weeks or sooner as needed, unless you are not seen by a neurologist and then please follow-up with me in 1 to 2 weeks.  If you have any questions or concerns, please do not hesitate to call the office at [phone].  Take Care,   Nathaniel Courtnee Myer, DO

## 2019-07-09 LAB — CBC
Hematocrit: 32.2 % — ABNORMAL LOW (ref 37.5–51.0)
Hemoglobin: 11 g/dL — ABNORMAL LOW (ref 13.0–17.7)
MCH: 30.6 pg (ref 26.6–33.0)
MCHC: 34.2 g/dL (ref 31.5–35.7)
MCV: 89 fL (ref 79–97)
Platelets: 220 10*3/uL (ref 150–450)
RBC: 3.6 x10E6/uL — ABNORMAL LOW (ref 4.14–5.80)
RDW: 14.7 % (ref 11.6–15.4)
WBC: 5.5 10*3/uL (ref 3.4–10.8)

## 2019-07-09 LAB — COMPREHENSIVE METABOLIC PANEL
ALT: 21 IU/L (ref 0–44)
AST: 34 IU/L (ref 0–40)
Albumin/Globulin Ratio: 0.9 — ABNORMAL LOW (ref 1.2–2.2)
Albumin: 3.7 g/dL — ABNORMAL LOW (ref 3.8–4.8)
Alkaline Phosphatase: 100 IU/L (ref 39–117)
BUN/Creatinine Ratio: 11 (ref 10–24)
BUN: 15 mg/dL (ref 8–27)
Bilirubin Total: 1.1 mg/dL (ref 0.0–1.2)
CO2: 15 mmol/L — ABNORMAL LOW (ref 20–29)
Calcium: 9.3 mg/dL (ref 8.6–10.2)
Chloride: 105 mmol/L (ref 96–106)
Creatinine, Ser: 1.38 mg/dL — ABNORMAL HIGH (ref 0.76–1.27)
GFR calc Af Amer: 61 mL/min/{1.73_m2} (ref >59)
GFR calc non Af Amer: 53 mL/min/{1.73_m2} — ABNORMAL LOW (ref >59)
Globulin, Total: 4 g/dL (ref 1.5–4.5)
Glucose: 78 mg/dL (ref 65–99)
Potassium: 4.1 mmol/L (ref 3.5–5.2)
Sodium: 138 mmol/L (ref 134–144)
Total Protein: 7.7 g/dL (ref 6.0–8.5)

## 2019-07-11 ENCOUNTER — Telehealth: Payer: Self-pay

## 2019-07-11 ENCOUNTER — Other Ambulatory Visit: Payer: Self-pay

## 2019-07-11 ENCOUNTER — Ambulatory Visit (INDEPENDENT_AMBULATORY_CARE_PROVIDER_SITE_OTHER): Payer: Medicare Other | Admitting: Physician Assistant

## 2019-07-11 ENCOUNTER — Encounter: Payer: Self-pay | Admitting: Physician Assistant

## 2019-07-11 VITALS — BP 118/78 | HR 104 | Temp 98.7°F | Ht 68.25 in | Wt 111.4 lb

## 2019-07-11 DIAGNOSIS — D649 Anemia, unspecified: Secondary | ICD-10-CM

## 2019-07-11 DIAGNOSIS — T148XXD Other injury of unspecified body region, subsequent encounter: Secondary | ICD-10-CM

## 2019-07-11 NOTE — Telephone Encounter (Signed)
[name] was seen in the office today for an evaluation of his anemia by Ellouise Newer, PA-C. She has discussed that at some point in the next month that she would like for him to have an Endoscopy and a Colonoscopy providing that he is cleared by your office. He has an up coming appointment with you office next week, 07/17/19. Please advise how to proceed after your evaluation.

## 2019-07-11 NOTE — Progress Notes (Signed)
Chief Complaint: Anemia  HPI:    [name] is a 69 year old African-American male with past medical history of CHF (12/16/2018 echo with EF of 50-55%) and others listed below including laryngeal cancer, who was referred to me by Shirley, [place], DO for a complaint of iron deficiency anemia.      04/10/2014 EGD by Dr. Amedeo Plenty in the hospital for dysphagia with apparent hypopharyngeal mass, no extension into the esophagus where there is some chronic proximal fibrosis with slight narrowing.    07/03/2019 patient seen in the ED for weakness.  CT of the head without contrast showed atrophy and mild chronic small vessel ischemic change in a stable 15 x 16 mm subfrontal meningioma.  Patient was given Keppra and Ativan for seizure.  Scheduled follow-up with neurology for May 4.    07/08/2019 CBC with a hemoglobin of 11 (13.6 on 07/03/2019).    Today, the patient presents to clinic accompanied by his daughter who does provide most of his history as he is status post tracheostomy and it is hard for him to talk.  She explains that he has been anemic for some time but this is never really been worked up.  Tells me that he does stay cold a lot of the time and has fatigue.    Also discusses history of a G-tube in the past which was removed about a year ago, but the site where this was still seeps and drains.  They have tried banding this area but the bandages irritate the patient's skin, so they just keep it open and the patient keeps this area clean.    We also discussed patient's recent work-up for seizures.  Daughter tells me that he had seizures in the past actively when he is going through chemotherapy for his laryngeal cancer, but had not had one recently.  Most recently over the past couple months his entire right side of his body will go numb when he is up and walking around or just randomly throughout the day.  They think this may be related to seizure activity.  They started him on Keppra in the ER but he just had  another episode a couple of days ago.  They are set to see neurology on May 13.    Denies fever, chills, weight loss, change in bowel habits, abdominal pain, nausea, vomiting, heartburn, reflux or blood in his stool.  Past Medical History:  Diagnosis Date  . Alcohol abuse   . Arthritis   . Avascular necrosis of hip, right ([place]) 08/09/2016  . CHF (congestive heart failure) ([place])    EF 45-50% 05/2012  . Cirrhosis of liver ([place])   . Complication of anesthesia    SUCCINYLCHOLINE ADVERSE REACTION (not an allergy) Following 1st attempt to place tracheotomy tube in 2011 at [facility]; patient went into "succinylcholine induced pulseless electrical activity secondary to probable hyperkalemia" and underwent chest compressions and placement of endotracheal tube with admission to medical ICU.  EXTREMELY HIGH SEVERE REACTION.   Marland Kitchen Hyperlipidemia   . Hypertension   . Hypothyroidism   . Laryngeal cancer ([place]) 2011   Laryngectomy, [id number]  . Seizures ([place])     Past Surgical History:  Procedure Laterality Date  . ESOPHAGOGASTRODUODENOSCOPY N/A 04/10/2014   Procedure: ESOPHAGOGASTRODUODENOSCOPY (EGD);  Surgeon: Missy Sabins, MD;  Location: [facility] ENDOSCOPY;  Service: Endoscopy;  Laterality: N/A;  . LEFT AND RIGHT HEART CATHETERIZATION WITH CORONARY ANGIOGRAM N/A 12/28/2011   Procedure: LEFT AND RIGHT HEART CATHETERIZATION WITH CORONARY ANGIOGRAM;  Surgeon: Jolaine Artist, MD;  Location: [facility] CATH LAB;  Service: Cardiovascular;  Laterality: N/A;  . TOTAL HIP ARTHROPLASTY Right 09/15/2016   Procedure: RIGHT TOTAL HIP ARTHROPLASTY ANTERIOR APPROACH;  Surgeon: Mcarthur Rossetti, MD;  Location: [facility] ORS;  Service: Orthopedics;  Laterality: Right;  . TRACHEAL SURGERY    . TRACHEOSTOMY      Current Outpatient Medications  Medication Sig Dispense Refill  . Alum & Mag Hydroxide-Simeth (MAGIC MOUTHWASH W/LIDOCAINE) SOLN Take 5 mLs by mouth 4 (four) times daily. (Patient not taking: Reported on 07/03/2019)  120 mL 0  . aspirin EC 81 MG tablet Take 1 tablet (81 mg total) by mouth 2 (two) times daily after a meal. (Patient taking differently: Take 81 mg by mouth daily. ) 30 tablet 0  . diclofenac sodium (VOLTAREN) 1 % GEL Apply 2 g topically 3 (three) times daily as needed (hip pain/back pain). 100 g 0  . guaiFENesin (MUCINEX) 600 MG 12 hr tablet Take 1 tablet (600 mg total) by mouth 2 (two) times daily. (Patient not taking: Reported on 07/03/2019) 120 tablet 3  . levETIRAcetam (KEPPRA) 100 MG/ML solution Take 5 mLs (500 mg total) by mouth 2 (two) times daily. 473 mL 0  . levothyroxine (SYNTHROID) 137 MCG tablet Take 1 tablet (137 mcg total) by mouth daily before breakfast. 90 tablet 1  . traMADol (ULTRAM) 50 MG tablet Take 2 tablets (100 mg total) by mouth every 6 (six) hours as needed. (Patient taking differently: Take 100 mg by mouth every 6 (six) hours as needed for moderate pain. ) 90 tablet 0   No current facility-administered medications for this visit.    Allergies as of 07/11/2019 - Review Complete 07/08/2019  Allergen Reaction Noted  . Succinylcholine Other (See Comments) 09/07/2016  . Tylenol [acetaminophen] Other (See Comments) and Hypertension 02/04/2014  . Vicodin [hydrocodone-acetaminophen] Rash 06/22/2011  . Other  05/04/2014  . Protonix [pantoprazole]  07/03/2019    Family History  Problem Relation Age of Onset  . Hyperlipidemia Mother   . Hypertension Mother   . Hypertension Father   . Hyperlipidemia Father     Social History   Socioeconomic History  . Marital status: Widowed    Spouse name: Not on file  . Number of children: Not on file  . Years of education: Not on file  . Highest education level: Not on file  Occupational History  . Not on file  Tobacco Use  . Smoking status: Former Smoker    Packs/day: 0.20    Years: 50.00    Pack years: 10.00    Types: Cigarettes  . Smokeless tobacco: Former Systems developer    Types: Chew  Substance and Sexual Activity  . Alcohol  use: Yes    Comment: often   . Drug use: Yes    Types: Marijuana    Comment: once a month  . Sexual activity: Not Currently  Other Topics Concern  . Not on file  Social History Narrative  . Not on file   Social Determinants of Health   Financial Resource Strain:   . Difficulty of Paying Living Expenses:   Food Insecurity:   . Worried About Charity fundraiser in the Last Year:   . Arboriculturist in the Last Year:   Transportation Needs:   . Film/video editor (Medical):   Marland Kitchen Lack of Transportation (Non-Medical):   Physical Activity:   . Days of Exercise per Week:   . Minutes of Exercise per Session:  Stress:   . Feeling of Stress :   Social Connections:   . Frequency of Communication with Friends and Family:   . Frequency of Social Gatherings with Friends and Family:   . Attends Religious Services:   . Active Member of Clubs or Organizations:   . Attends Archivist Meetings:   Marland Kitchen Marital Status:   Intimate Partner Violence:   . Fear of Current or Ex-Partner:   . Emotionally Abused:   Marland Kitchen Physically Abused:   . Sexually Abused:     Review of Systems:    Constitutional: No weight loss, fever or chills Skin: No rash Cardiovascular: No chest pain Respiratory: No SOB  Gastrointestinal: See HPI and otherwise negative Genitourinary: No dysuria  Neurological: No headache, dizziness or syncope Musculoskeletal: No new muscle or joint pain Hematologic: No bleeding or bruising Psychiatric: No history of depression or anxiety   Physical Exam:  Vital signs: BP 118/78 (BP Location: Left Arm, Patient Position: Sitting, Cuff Size: Normal)   Pulse (!) 104   Temp 98.7 F (37.1 C)   Ht 5' 8.25" (1.734 m) Comment: height measured without shoes  Wt 111 lb 6 oz (50.5 kg)   BMI 16.81 kg/m   Constitutional:   Pleasant Caucasian male appears to be in NAD, Well developed, Well nourished, alert and cooperative Head:  Normocephalic and atraumatic. Eyes:   PEERL, EOMI.  No icterus. Conjunctiva pink. Ears:  Normal auditory acuity. Neck:  Supple Throat: Oral cavity and pharynx without inflammation, swelling or lesion. +tracheostomy Respiratory: Respirations even and unlabored. Lungs clear to auscultation bilaterally.   No wheezes, crackles, or rhonchi.  Cardiovascular: Normal S1, S2. No MRG. Regular rate and rhythm. No peripheral edema, cyanosis or pallor.  Gastrointestinal:  Soft, nondistended, nontender. No rebound or guarding. Normal bowel sounds. No appreciable masses or hepatomegaly. Rectal:  Not performed.  Msk:  Symmetrical without gross deformities. Without edema, no deformity or joint abnormality.  Neurologic:  Alert and  oriented x4;  grossly normal neurologically.  Skin:   Dry and intact without significant lesions or rashes. Psychiatric: Demonstrates good judgement and reason without abnormal affect or behaviors.  RELEVANT LABS AND IMAGING: CBC    Component Value Date/Time   WBC 5.5 07/08/2019 1639   WBC 5.5 07/03/2019 1847   RBC 3.60 (L) 07/08/2019 1639   RBC 3.83 (L) 07/03/2019 1847   HGB 11.0 (L) 07/08/2019 1639   HGB 12.4 (L) 11/04/2009 1559   HCT 32.2 (L) 07/08/2019 1639   HCT 38.0 (L) 11/04/2009 1559   PLT 220 07/08/2019 1639   MCV 89 07/08/2019 1639   MCV 91.2 11/04/2009 1559   MCH 30.6 07/08/2019 1639   MCH 30.3 07/03/2019 1847   MCHC 34.2 07/08/2019 1639   MCHC 31.9 07/03/2019 1847   RDW 14.7 07/08/2019 1639   RDW 17.2 (H) 11/04/2009 1559   LYMPHSABS 1.7 07/03/2019 1847   LYMPHSABS 2.1 11/04/2009 1559   MONOABS 0.9 07/03/2019 1847   MONOABS 0.4 11/04/2009 1559   EOSABS 0.1 07/03/2019 1847   EOSABS 0.1 11/04/2009 1559   BASOSABS 0.0 07/03/2019 1847   BASOSABS 0.0 11/04/2009 1559    CMP     Component Value Date/Time   NA 138 07/08/2019 1639   K 4.1 07/08/2019 1639   CL 105 07/08/2019 1639   CL 103 08/21/2014 0000   CO2 15 (L) 07/08/2019 1639   GLUCOSE 78 07/08/2019 1639   GLUCOSE 95 07/03/2019 1924   BUN 15  07/08/2019 1639   CREATININE  1.38 (H) 07/08/2019 1639   CREATININE 0.65 07/28/2014 1144   CALCIUM 9.3 07/08/2019 1639   CALCIUM 8.9 08/21/2014 0000   PROT 7.7 07/08/2019 1639   PROT 7.3 08/21/2014 0000   ALBUMIN 3.7 (L) 07/08/2019 1639   ALBUMIN 3.3 08/21/2014 0000   AST 34 07/08/2019 1639   ALT 21 07/08/2019 1639   ALKPHOS 100 07/08/2019 1639   BILITOT 1.1 07/08/2019 1639   GFRNONAA 53 (L) 07/08/2019 1639   GFRAA 61 07/08/2019 1639    Assessment: 1.  Anemia: Most recent hemoglobin 11, this appears to be around patient's baseline, no overt signs of GI bleeding; consider GI source versus other 2.  Seizure like activity?:  Right-sided weakness/numbness which occurs periodically over the past couple of months, no help from [place] recently, follow-up with neurology soon 3.  Slow healing wound: G-tube site is still not healed after a year  Plan: 1.  Discussed with the patient and his daughter that patient will benefit from an EGD and colonoscopy for further evaluation.  I would like to wait on these procedures though until he has further work-up of his seizure-like activity with neurology.  He does have an appointment set with him on May 13.  We will send a note to [facility] and let them know that we would like to do an EGD and colonoscopy for anemia and asked them to let us know when this would be appropriate. 2.  Referred patient to the wound clinic 3.  Patient to follow in clinic per recommendations after we receive clearance from neurology.  He was assigned to Dr. Rush Landmark this afternoon.  Ellouise Newer, PA-C [facility] 07/11/2019, 2:59 PM  Cc: Shirley, [place], DO

## 2019-07-11 NOTE — Patient Instructions (Addendum)
If you are age 67 or older, your body mass index should be between 23-30. Your Body mass index is 16.81 kg/m. If this is out of the aforementioned range listed, please consider follow up with your Primary Care Provider.  If you are age 51 or younger, your body mass index should be between 19-25. Your Body mass index is 16.81 kg/m. If this is out of the aformentioned range listed, please consider follow up with your Primary Care Provider.   You receive a phone call from [facility]. They are located at [address], [place], [place] [address].  We will be consulting with your neurologist to discuss a clearance for an Endoscopy and Colonoscopy.  Follow up as needed and pending clearance from Neurology.

## 2019-07-11 NOTE — Progress Notes (Signed)
Attending Physician's Attestation   I have reviewed the chart.   I agree with the Advanced Practitioner's note, impression, and recommendations with any updates as below.    Amesha Bailey Mansouraty, MD [facility] Advanced Endoscopy Office # [phone]  

## 2019-07-13 DIAGNOSIS — R569 Unspecified convulsions: Secondary | ICD-10-CM | POA: Insufficient documentation

## 2019-07-13 NOTE — Assessment & Plan Note (Signed)
Patient seen in ED and neuro presumed seizures given hx of arm twitching and patient's prior hx of seizures. He ia also an alcoholic, actively drinking. He has continued keppra after being started in ED but reports continued seizures. Patient did not receive any head imaging in ED to rule out other etiology. CT head stable from prior. Today patient with strength 5/5 in BLUE and BLLE, whereas at last office visit had 3/5 strength in RUE and RLE on exam that was markedly different. Called neuro to have appointment set up for patient to be seen as soon as possible to help get seizures under better control.

## 2019-07-14 NOTE — Telephone Encounter (Signed)
GI referral - Question of neurological clearance for endoscopy and colonoscopy in a patient known to have been malnourished in the past This anemia patient with no previously documented neurological history except recent onset  of recurrent/ intermittend hemiplegia  Activity.   Long standing throat cancer , formerly with Gi Tube, and liver disease due to alcohol abuse, pancreatitis, also- old blow out fracture of the orbita dextra.  Recent CT no acute abnormalities, no contrast given. Meningeoma followed by Dr Vertell Limber, has been slowly growing. Chronic lacunar infarcts and atrophy.   Larey Seat, MD

## 2019-07-17 ENCOUNTER — Ambulatory Visit (INDEPENDENT_AMBULATORY_CARE_PROVIDER_SITE_OTHER): Payer: Medicare Other | Admitting: Neurology

## 2019-07-17 ENCOUNTER — Telehealth: Payer: Self-pay | Admitting: Neurology

## 2019-07-17 ENCOUNTER — Encounter: Payer: Self-pay | Admitting: Neurology

## 2019-07-17 ENCOUNTER — Other Ambulatory Visit: Payer: Self-pay

## 2019-07-17 VITALS — BP 122/78 | HR 118 | Temp 96.8°F | Ht 71.0 in | Wt 110.0 lb

## 2019-07-17 DIAGNOSIS — R569 Unspecified convulsions: Secondary | ICD-10-CM | POA: Diagnosis not present

## 2019-07-17 DIAGNOSIS — E440 Moderate protein-calorie malnutrition: Secondary | ICD-10-CM

## 2019-07-17 DIAGNOSIS — F10239 Alcohol dependence with withdrawal, unspecified: Secondary | ICD-10-CM | POA: Diagnosis not present

## 2019-07-17 DIAGNOSIS — G9340 Encephalopathy, unspecified: Secondary | ICD-10-CM | POA: Diagnosis not present

## 2019-07-17 NOTE — Telephone Encounter (Signed)
medicare order sent to GI. No auth they will reach out to the patient to schedule.  
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
## 2019-07-17 NOTE — Patient Instructions (Signed)
Alcohol Abuse and Nutrition Alcohol abuse is any pattern of alcohol consumption that harms your health, relationships, or work. Alcohol abuse can cause poor nutrition (malnutrition or malnourishment) and a lack of nutrients (nutrient deficiencies), which can lead to more complications. Alcohol abuse brings malnutrition and nutrient deficiencies in two ways:  It causes your liver to work abnormally. This affects how your body divides (breaks down) and absorbs nutrients from food.  It causes you to eat poorly. Many people who abuse alcohol do not eat enough carbohydrates, protein, fat, vitamins, and minerals. Nutrients that are commonly lacking (deficient) in people who abuse alcohol include:  Vitamins. ? Vitamin A. This is needed for your vision, metabolism, and ability to fight off infections (immunity). ? B vitamins. These include folate, thiamine, and niacin. These are needed for new cell growth. ? Vitamin C. This plays an important role in wound healing, immunity, and helping your body to absorb iron. ? Vitamin D. This is necessary for your body to absorb and use calcium. It is produced by your liver, but you can also get it from food and from sun exposure.  Minerals. ? Calcium. This is needed for healthy bones as well as heart and blood vessel (cardiovascular) function. ? Iron. This is important for blood, muscle, and nervous system functioning. ? Magnesium. This plays an important role in muscle and nerve function, and it helps to control blood sugar and blood pressure. ? Zinc. This is important for the normal functioning of your nervous system and digestive system (gastrointestinal tract). If you think that you have an alcohol dependency problem, or if it is hard to stop drinking because you feel sick or different when you do not use alcohol, talk with your health care provider or another health professional about where to get help. Nutrition is an essential factor in therapy for alcohol  abuse. Your health care provider or diet and nutrition specialist (dietitian) will work with you to design a plan that can help to restore nutrients to your body and prevent the risk of complications. What is my plan? Your dietitian may develop a specific eating plan that is based on your condition and any other problems that you have. An eating plan will commonly include:  A balanced diet. ? Grains: 6-8 oz (170-227 g) a day. Examples of 1 oz of whole grains include 1 cup of whole-wheat cereal,  cup of [place] rice, or 1 slice of whole-wheat bread. ? Vegetables: 2-3 cups a day. Examples of 1 cup of vegetables include 2 medium carrots, 1 large tomato, or 2 stalks of celery. ? Fruits: 1-2 cups a day. Examples of 1 cup of fruit include 1 large banana, 1 small apple, 8 large strawberries, or 1 large orange. ? Meat and other protein: 5-6 oz (142-170 g) a day.  A cut of meat or fish that is the size of a deck of cards is about 3-4 oz.  Foods that provide 1 oz of protein include 1 egg,  cup of nuts or seeds, or 1 tablespoon (16 g) of peanut butter. ? Dairy: 2-3 cups a day. Examples of 1 cup of dairy include 8 oz (230 mL) of milk, 8 oz (230 g) of yogurt, or 1 oz (44 g) of natural cheese.  Vitamin and mineral supplements. What are tips for following this plan?  Eat frequent meals and snacks. Try to eat 5-6 small meals each day.  Take vitamin or mineral supplements as recommended by your dietitian.  If you are malnourished  or if your dietitian recommends it: ? You may follow a high-protein, high-calorie diet. This may include:  2,000-3,000 calories (kilocalories) a day.  70-100 g (grams) of protein a day. ? You may be directed to follow a diet that includes a complete nutritional supplement beverage. This can help to restore calories, protein, and vitamins to your body. Depending on your condition, you may be advised to consume this beverage instead of your meals or in addition to them.  Certain  medicines may cause changes in your appetite, taste, and weight. Work with your health care provider and dietitian to make any changes to your medicines and eating plan.  If you are unable to take in enough food and calories by mouth, your health care provider may recommend a feeding tube. This tube delivers nutritional supplements directly to your stomach. Recommended foods  Eat foods that are high in molecules that prevent oxygen from reacting with your food (antioxidants). These foods include grapes, berries, nuts, green tea, and dark green or orange vegetables. Eating these can help to prevent some of the stress that is placed on your liver by consuming alcohol.  Eat a variety of fresh fruits and vegetables each day. This will help you to get fiber and vitamins in your diet.  Drink plenty of water and other clear fluids, such as apple juice and broth. Try to drink at least 48-64 oz (1.5-2 L) of water a day.  Include foods fortified with vitamins and minerals in your diet. Commonly fortified foods include milk, orange juice, cereal, and bread.  Eat a variety of foods that are high in omega-3 and omega-6 fatty acids. These include fish, nuts and seeds, and soybeans. These foods may help your liver to recover and may also stabilize your mood.  If you are a vegetarian: ? Eat a variety of protein-rich foods. ? Pair whole grains with plant-based proteins at meals and snack time. For example, eat rice with beans, put peanut butter on whole-grain toast, or eat oatmeal with sunflower seeds. The items listed above may not be a complete list of foods and beverages you can eat. Contact a dietitian for more information. Foods to avoid  Avoid foods and drinks that are high in fat and sugar. Sugary drinks, salty snacks, and candy contain empty calories. This means that they lack important nutrients such as protein, fiber, and vitamins.  Avoid alcohol. This is the best way to avoid malnutrition due to  alcohol abuse. If you must drink, drink measured amounts. Measured drinking means limiting your intake to no more than 1 drink a day for nonpregnant women and 2 drinks a day for men. One drink equals 12 oz (355 mL) of beer, 5 oz (148 mL) of wine, or 1 oz (44 mL) of hard liquor.  Limit your intake of caffeine. Replace drinks like coffee and black tea with decaffeinated coffee and decaffeinated herbal tea. The items listed above may not be a complete list of foods and beverages you should avoid. Contact a dietitian for more information. Summary  Alcohol abuse can cause poor nutrition (malnutrition or malnourishment) and a lack of nutrients (nutrient deficiencies), which can lead to more health problems.  Common nutrient deficiencies include vitamin deficiencies (A, B, C, and D) and mineral deficiencies (calcium, iron, magnesium, and zinc).  Nutrition is an essential factor in therapy for alcohol abuse.  Your health care provider and dietitian can help you to develop a specific eating plan that includes a balanced diet plus vitamin and  mineral supplements. This information is not intended to replace advice given to you by your health care provider. Make sure you discuss any questions you have with your health care provider. Document Revised: 06/11/2018 Document Reviewed: 11/07/2016 Elsevier Patient Education  2020 [employer]  

## 2019-07-20 ENCOUNTER — Other Ambulatory Visit: Payer: Self-pay

## 2019-07-20 ENCOUNTER — Emergency Department (HOSPITAL_COMMUNITY): Payer: Medicare Other

## 2019-07-20 ENCOUNTER — Inpatient Hospital Stay (HOSPITAL_COMMUNITY)
Admission: EM | Admit: 2019-07-20 | Discharge: 2019-07-24 | DRG: 100 | Disposition: A | Discharge status: Skilled Nursing Facility | Payer: Medicare Other | Attending: Family Medicine | Admitting: Family Medicine

## 2019-07-20 ENCOUNTER — Inpatient Hospital Stay (HOSPITAL_COMMUNITY): Payer: Medicare Other

## 2019-07-20 ENCOUNTER — Encounter (HOSPITAL_COMMUNITY): Payer: Self-pay

## 2019-07-20 DIAGNOSIS — S066X9A Traumatic subarachnoid hemorrhage with loss of consciousness of unspecified duration, initial encounter: Secondary | ICD-10-CM | POA: Diagnosis present

## 2019-07-20 DIAGNOSIS — R2689 Other abnormalities of gait and mobility: Secondary | ICD-10-CM | POA: Diagnosis present

## 2019-07-20 DIAGNOSIS — N183 Chronic kidney disease, stage 3 unspecified: Secondary | ICD-10-CM

## 2019-07-20 DIAGNOSIS — R296 Repeated falls: Secondary | ICD-10-CM | POA: Diagnosis not present

## 2019-07-20 DIAGNOSIS — E7800 Pure hypercholesterolemia, unspecified: Secondary | ICD-10-CM | POA: Diagnosis not present

## 2019-07-20 DIAGNOSIS — S066X0A Traumatic subarachnoid hemorrhage without loss of consciousness, initial encounter: Secondary | ICD-10-CM | POA: Diagnosis not present

## 2019-07-20 DIAGNOSIS — Z8521 Personal history of malignant neoplasm of larynx: Secondary | ICD-10-CM | POA: Diagnosis not present

## 2019-07-20 DIAGNOSIS — C321 Malignant neoplasm of supraglottis: Secondary | ICD-10-CM

## 2019-07-20 DIAGNOSIS — Z7982 Long term (current) use of aspirin: Secondary | ICD-10-CM

## 2019-07-20 DIAGNOSIS — S52601D Unspecified fracture of lower end of right ulna, subsequent encounter for closed fracture with routine healing: Secondary | ICD-10-CM | POA: Diagnosis not present

## 2019-07-20 DIAGNOSIS — G9608 Other cranial cerebrospinal fluid leak: Secondary | ICD-10-CM | POA: Diagnosis present

## 2019-07-20 DIAGNOSIS — Z79890 Hormone replacement therapy (postmenopausal): Secondary | ICD-10-CM

## 2019-07-20 DIAGNOSIS — Z96641 Presence of right artificial hip joint: Secondary | ICD-10-CM | POA: Diagnosis present

## 2019-07-20 DIAGNOSIS — F10229 Alcohol dependence with intoxication, unspecified: Secondary | ICD-10-CM | POA: Diagnosis present

## 2019-07-20 DIAGNOSIS — M2550 Pain in unspecified joint: Secondary | ICD-10-CM | POA: Diagnosis not present

## 2019-07-20 DIAGNOSIS — E039 Hypothyroidism, unspecified: Secondary | ICD-10-CM | POA: Diagnosis present

## 2019-07-20 DIAGNOSIS — S52021A Displaced fracture of olecranon process without intraarticular extension of right ulna, initial encounter for closed fracture: Secondary | ICD-10-CM

## 2019-07-20 DIAGNOSIS — Y906 Blood alcohol level of 120-199 mg/100 ml: Secondary | ICD-10-CM | POA: Diagnosis present

## 2019-07-20 DIAGNOSIS — R29810 Facial weakness: Secondary | ICD-10-CM | POA: Diagnosis present

## 2019-07-20 DIAGNOSIS — Y92009 Unspecified place in unspecified non-institutional (private) residence as the place of occurrence of the external cause: Secondary | ICD-10-CM

## 2019-07-20 DIAGNOSIS — W19XXXA Unspecified fall, initial encounter: Secondary | ICD-10-CM | POA: Diagnosis not present

## 2019-07-20 DIAGNOSIS — S199XXA Unspecified injury of neck, initial encounter: Secondary | ICD-10-CM | POA: Diagnosis not present

## 2019-07-20 DIAGNOSIS — S42401A Unspecified fracture of lower end of right humerus, initial encounter for closed fracture: Secondary | ICD-10-CM

## 2019-07-20 DIAGNOSIS — I130 Hypertensive heart and chronic kidney disease with heart failure and stage 1 through stage 4 chronic kidney disease, or unspecified chronic kidney disease: Secondary | ICD-10-CM | POA: Diagnosis present

## 2019-07-20 DIAGNOSIS — Z8249 Family history of ischemic heart disease and other diseases of the circulatory system: Secondary | ICD-10-CM

## 2019-07-20 DIAGNOSIS — N179 Acute kidney failure, unspecified: Secondary | ICD-10-CM

## 2019-07-20 DIAGNOSIS — F1010 Alcohol abuse, uncomplicated: Secondary | ICD-10-CM | POA: Diagnosis not present

## 2019-07-20 DIAGNOSIS — F10129 Alcohol abuse with intoxication, unspecified: Secondary | ICD-10-CM

## 2019-07-20 DIAGNOSIS — R569 Unspecified convulsions: Secondary | ICD-10-CM | POA: Diagnosis not present

## 2019-07-20 DIAGNOSIS — N1831 Chronic kidney disease, stage 3a: Secondary | ICD-10-CM

## 2019-07-20 DIAGNOSIS — Z888 Allergy status to other drugs, medicaments and biological substances status: Secondary | ICD-10-CM | POA: Diagnosis not present

## 2019-07-20 DIAGNOSIS — K7460 Unspecified cirrhosis of liver: Secondary | ICD-10-CM | POA: Diagnosis present

## 2019-07-20 DIAGNOSIS — Z20822 Contact with and (suspected) exposure to covid-19: Secondary | ICD-10-CM | POA: Diagnosis present

## 2019-07-20 DIAGNOSIS — Z681 Body mass index (BMI) 19 or less, adult: Secondary | ICD-10-CM

## 2019-07-20 DIAGNOSIS — I10 Essential (primary) hypertension: Secondary | ICD-10-CM | POA: Diagnosis present

## 2019-07-20 DIAGNOSIS — R531 Weakness: Secondary | ICD-10-CM | POA: Diagnosis not present

## 2019-07-20 DIAGNOSIS — S52609A Unspecified fracture of lower end of unspecified ulna, initial encounter for closed fracture: Secondary | ICD-10-CM

## 2019-07-20 DIAGNOSIS — Z83438 Family history of other disorder of lipoprotein metabolism and other lipidemia: Secondary | ICD-10-CM

## 2019-07-20 DIAGNOSIS — I5032 Chronic diastolic (congestive) heart failure: Secondary | ICD-10-CM | POA: Diagnosis present

## 2019-07-20 DIAGNOSIS — M6281 Muscle weakness (generalized): Secondary | ICD-10-CM | POA: Diagnosis present

## 2019-07-20 DIAGNOSIS — K7030 Alcoholic cirrhosis of liver without ascites: Secondary | ICD-10-CM | POA: Diagnosis present

## 2019-07-20 DIAGNOSIS — E871 Hypo-osmolality and hyponatremia: Secondary | ICD-10-CM | POA: Diagnosis present

## 2019-07-20 DIAGNOSIS — C323 Malignant neoplasm of laryngeal cartilage: Secondary | ICD-10-CM | POA: Diagnosis present

## 2019-07-20 DIAGNOSIS — F1029 Alcohol dependence with unspecified alcohol-induced disorder: Secondary | ICD-10-CM | POA: Diagnosis not present

## 2019-07-20 DIAGNOSIS — I609 Nontraumatic subarachnoid hemorrhage, unspecified: Secondary | ICD-10-CM | POA: Diagnosis present

## 2019-07-20 DIAGNOSIS — Z885 Allergy status to narcotic agent status: Secondary | ICD-10-CM | POA: Diagnosis not present

## 2019-07-20 DIAGNOSIS — Z8719 Personal history of other diseases of the digestive system: Secondary | ICD-10-CM | POA: Diagnosis not present

## 2019-07-20 DIAGNOSIS — Z87891 Personal history of nicotine dependence: Secondary | ICD-10-CM

## 2019-07-20 DIAGNOSIS — E785 Hyperlipidemia, unspecified: Secondary | ICD-10-CM | POA: Diagnosis present

## 2019-07-20 DIAGNOSIS — F1020 Alcohol dependence, uncomplicated: Secondary | ICD-10-CM | POA: Diagnosis present

## 2019-07-20 DIAGNOSIS — Z825 Family history of asthma and other chronic lower respiratory diseases: Secondary | ICD-10-CM

## 2019-07-20 DIAGNOSIS — Z7401 Bed confinement status: Secondary | ICD-10-CM | POA: Diagnosis not present

## 2019-07-20 DIAGNOSIS — S52601A Unspecified fracture of lower end of right ulna, initial encounter for closed fracture: Secondary | ICD-10-CM | POA: Diagnosis present

## 2019-07-20 DIAGNOSIS — S0990XA Unspecified injury of head, initial encounter: Secondary | ICD-10-CM | POA: Diagnosis not present

## 2019-07-20 DIAGNOSIS — S52021D Displaced fracture of olecranon process without intraarticular extension of right ulna, subsequent encounter for closed fracture with routine healing: Secondary | ICD-10-CM | POA: Diagnosis not present

## 2019-07-20 DIAGNOSIS — I5023 Acute on chronic systolic (congestive) heart failure: Secondary | ICD-10-CM | POA: Diagnosis present

## 2019-07-20 DIAGNOSIS — Z79899 Other long term (current) drug therapy: Secondary | ICD-10-CM

## 2019-07-20 DIAGNOSIS — R627 Adult failure to thrive: Secondary | ICD-10-CM | POA: Diagnosis present

## 2019-07-20 DIAGNOSIS — R5381 Other malaise: Secondary | ICD-10-CM | POA: Diagnosis not present

## 2019-07-20 DIAGNOSIS — E162 Hypoglycemia, unspecified: Secondary | ICD-10-CM | POA: Diagnosis not present

## 2019-07-20 DIAGNOSIS — G40909 Epilepsy, unspecified, not intractable, without status epilepticus: Secondary | ICD-10-CM | POA: Diagnosis present

## 2019-07-20 DIAGNOSIS — Z800 Family history of malignant neoplasm of digestive organs: Secondary | ICD-10-CM

## 2019-07-20 HISTORY — DX: Nontraumatic subarachnoid hemorrhage, unspecified: I60.9

## 2019-07-20 LAB — COMPREHENSIVE METABOLIC PANEL
ALT: 38 U/L (ref 0–44)
AST: 60 U/L — ABNORMAL HIGH (ref 15–41)
Albumin: 4 g/dL (ref 3.5–5.0)
Alkaline Phosphatase: 90 U/L (ref 38–126)
Anion gap: 15 (ref 5–15)
BUN: 14 mg/dL (ref 8–23)
CO2: 22 mmol/L (ref 22–32)
Calcium: 9.3 mg/dL (ref 8.9–10.3)
Chloride: 96 mmol/L — ABNORMAL LOW (ref 98–111)
Creatinine, Ser: 1.61 mg/dL — ABNORMAL HIGH (ref 0.61–1.24)
GFR calc Af Amer: 51 mL/min — ABNORMAL LOW (ref >60)
GFR calc non Af Amer: 44 mL/min — ABNORMAL LOW (ref >60)
Glucose, Bld: 87 mg/dL (ref 70–99)
Potassium: 4.1 mmol/L (ref 3.5–5.1)
Sodium: 133 mmol/L — ABNORMAL LOW (ref 135–145)
Total Bilirubin: 1.1 mg/dL (ref 0.3–1.2)
Total Protein: 9 g/dL — ABNORMAL HIGH (ref 6.5–8.1)

## 2019-07-20 LAB — CBC WITH DIFFERENTIAL/PLATELET
Abs Immature Granulocytes: 0.05 10*3/uL (ref 0.00–0.07)
Basophils Absolute: 0 10*3/uL (ref 0.0–0.1)
Basophils Relative: 1 %
Eosinophils Absolute: 0.2 10*3/uL (ref 0.0–0.5)
Eosinophils Relative: 2 %
HCT: 36.4 % — ABNORMAL LOW (ref 39.0–52.0)
Hemoglobin: 12 g/dL — ABNORMAL LOW (ref 13.0–17.0)
Immature Granulocytes: 1 %
Lymphocytes Relative: 23 %
Lymphs Abs: 1.8 10*3/uL (ref 0.7–4.0)
MCH: 29.9 pg (ref 26.0–34.0)
MCHC: 33 g/dL (ref 30.0–36.0)
MCV: 90.8 fL (ref 80.0–100.0)
Monocytes Absolute: 0.8 10*3/uL (ref 0.1–1.0)
Monocytes Relative: 10 %
Neutro Abs: 5 10*3/uL (ref 1.7–7.7)
Neutrophils Relative %: 63 %
Platelets: 285 10*3/uL (ref 150–400)
RBC: 4.01 MIL/uL — ABNORMAL LOW (ref 4.22–5.81)
RDW: 14.7 % (ref 11.5–15.5)
WBC: 7.8 10*3/uL (ref 4.0–10.5)
nRBC: 0 % (ref 0.0–0.2)

## 2019-07-20 LAB — CBG MONITORING, ED: Glucose-Capillary: 77 mg/dL (ref 70–99)

## 2019-07-20 LAB — ETHANOL: Alcohol, Ethyl (B): 179 mg/dL — ABNORMAL HIGH (ref <10)

## 2019-07-20 LAB — SARS CORONAVIRUS 2 BY RT PCR (HOSPITAL ORDER, PERFORMED IN ~~LOC~~ HOSPITAL LAB): SARS Coronavirus 2: NEGATIVE

## 2019-07-20 LAB — TSH: TSH: 1.335 u[IU]/mL (ref 0.350–4.500)

## 2019-07-20 MED ORDER — ASPIRIN EC 81 MG PO TBEC: 81.0000 mg | DELAYED_RELEASE_TABLET | Freq: Every day | ORAL | Status: DC

## 2019-07-20 MED ORDER — THIAMINE HCL 100 MG/ML IJ SOLN
100.0000 mg | Freq: Every day | INTRAMUSCULAR | Status: DC
  Filled 2019-07-20: qty 2

## 2019-07-20 MED ORDER — THIAMINE HCL 100 MG PO TABS
100.0000 mg | ORAL_TABLET | Freq: Once | ORAL | Status: AC
  Administered 2019-07-20: 100 mg via ORAL
  Filled 2019-07-20: qty 1

## 2019-07-20 MED ORDER — LEVOTHYROXINE SODIUM 25 MCG PO TABS
137.0000 ug | ORAL_TABLET | Freq: Every day | ORAL | Status: DC
  Administered 2019-07-21 – 2019-07-24 (×4): 137 ug via ORAL
  Filled 2019-07-20 (×4): qty 1

## 2019-07-20 MED ORDER — LORAZEPAM 1 MG PO TABS: 0.0000 mg | ORAL_TABLET | Freq: Four times a day (QID) | ORAL | Status: AC

## 2019-07-20 MED ORDER — ADULT MULTIVITAMIN W/MINERALS CH
1.0000 | ORAL_TABLET | Freq: Every day | ORAL | Status: DC
  Administered 2019-07-21 – 2019-07-24 (×4): 1 via ORAL
  Filled 2019-07-20 (×4): qty 1

## 2019-07-20 MED ORDER — SODIUM CHLORIDE 0.9 % IV SOLN: INTRAVENOUS | Status: AC

## 2019-07-20 MED ORDER — LEVETIRACETAM IN NACL 1000 MG/100ML IV SOLN
1000.0000 mg | Freq: Once | INTRAVENOUS | Status: AC
  Administered 2019-07-20: 1000 mg via INTRAVENOUS
  Filled 2019-07-20: qty 100

## 2019-07-20 MED ORDER — FOLIC ACID 1 MG PO TABS
1.0000 mg | ORAL_TABLET | Freq: Every day | ORAL | Status: DC
  Administered 2019-07-21 – 2019-07-24 (×4): 1 mg via ORAL
  Filled 2019-07-20 (×4): qty 1

## 2019-07-20 MED ORDER — THIAMINE HCL 100 MG PO TABS
100.0000 mg | ORAL_TABLET | Freq: Every day | ORAL | Status: DC
  Administered 2019-07-21 – 2019-07-24 (×4): 100 mg via ORAL
  Filled 2019-07-20 (×5): qty 1

## 2019-07-20 MED ORDER — LORAZEPAM 2 MG/ML IJ SOLN
0.5000 mg | Freq: Once | INTRAMUSCULAR | Status: AC
  Administered 2019-07-20: 0.5 mg via INTRAVENOUS
  Filled 2019-07-20: qty 1

## 2019-07-20 MED ORDER — LORAZEPAM 1 MG PO TABS: 1.0000 mg | ORAL_TABLET | ORAL | Status: AC | PRN

## 2019-07-20 MED ORDER — ONDANSETRON HCL 4 MG PO TABS: 4.0000 mg | ORAL_TABLET | Freq: Four times a day (QID) | ORAL | Status: DC | PRN

## 2019-07-20 MED ORDER — HYDRALAZINE HCL 20 MG/ML IJ SOLN
10.0000 mg | Freq: Four times a day (QID) | INTRAMUSCULAR | Status: DC | PRN
  Administered 2019-07-20 – 2019-07-21 (×2): 10 mg via INTRAVENOUS
  Filled 2019-07-20 (×2): qty 1

## 2019-07-20 MED ORDER — LORAZEPAM 2 MG/ML IJ SOLN: 1.0000 mg | INTRAMUSCULAR | Status: AC | PRN

## 2019-07-20 MED ORDER — SODIUM CHLORIDE 0.9 % IV BOLUS
500.0000 mL | Freq: Once | INTRAVENOUS | Status: AC
  Administered 2019-07-20: 500 mL via INTRAVENOUS

## 2019-07-20 MED ORDER — LEVETIRACETAM 500 MG PO TABS
500.0000 mg | ORAL_TABLET | Freq: Two times a day (BID) | ORAL | Status: DC
  Administered 2019-07-20 – 2019-07-24 (×8): 500 mg via ORAL
  Filled 2019-07-20 (×8): qty 1

## 2019-07-20 MED ORDER — LORAZEPAM 1 MG PO TABS: 0.0000 mg | ORAL_TABLET | Freq: Two times a day (BID) | ORAL | Status: DC

## 2019-07-20 MED ORDER — ONDANSETRON HCL 4 MG/2ML IJ SOLN: 4.0000 mg | Freq: Four times a day (QID) | INTRAMUSCULAR | Status: DC | PRN
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
## 2019-07-20 NOTE — ED Notes (Signed)
RN attempted to ambulate patient with assistance. Patient initially able to stand with minimal help, but with a few steps, patient became unsteady and almost fell. RN and NT able to get patient back in bed safely. RN asked patient if there was pain with walking and he shook his head no. When asked if it was just too difficult to walk patient nodded yes.

## 2019-07-20 NOTE — ED Triage Notes (Addendum)
[name] daughter reports that he may have had a seizure at 49 today and foound him on the floor and is not sure if he had a seizure or not and the second fall was proceeded by a seizure that she witnessed.  Patient has a right head injury near the right eye and right elbow is swollen and painful.  Patient already has a history of right -sided weakness.  [name] daughter reports that the patient has been drinking alcohol.  Patient has a voice box prosthesis and is HOH.

## 2019-07-20 NOTE — ED Notes (Signed)
Daughter is requesting to be notified when PT is transferred out of the department

## 2019-07-20 NOTE — Progress Notes (Signed)
Orthopedic Tech Progress Note Patient Details:  [name] August 07, 1950 [id number]  Ortho Devices Type of Ortho Device: Ace wrap, Sling immobilizer, Post (long arm) splint Ortho Device/Splint Location: right Ortho Device/Splint Interventions: Application   Post Interventions Patient Tolerated: Well Instructions Provided: Care of device   [place] Pink 07/20/2019, 1:18 PM
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
## 2019-07-20 NOTE — ED Notes (Signed)
Patient transported to MRI 

## 2019-07-21 DIAGNOSIS — F1010 Alcohol abuse, uncomplicated: Secondary | ICD-10-CM

## 2019-07-21 LAB — COMPREHENSIVE METABOLIC PANEL
ALT: 30 U/L (ref 0–44)
AST: 43 U/L — ABNORMAL HIGH (ref 15–41)
Albumin: 3.9 g/dL (ref 3.5–5.0)
Alkaline Phosphatase: 92 U/L (ref 38–126)
Anion gap: 18 — ABNORMAL HIGH (ref 5–15)
BUN: 18 mg/dL (ref 8–23)
CO2: 19 mmol/L — ABNORMAL LOW (ref 22–32)
Calcium: 8.7 mg/dL — ABNORMAL LOW (ref 8.9–10.3)
Chloride: 98 mmol/L (ref 98–111)
Creatinine, Ser: 1.47 mg/dL — ABNORMAL HIGH (ref 0.61–1.24)
GFR calc Af Amer: 57 mL/min — ABNORMAL LOW (ref >60)
GFR calc non Af Amer: 49 mL/min — ABNORMAL LOW (ref >60)
Glucose, Bld: 61 mg/dL — ABNORMAL LOW (ref 70–99)
Potassium: 3.9 mmol/L (ref 3.5–5.1)
Sodium: 135 mmol/L (ref 135–145)
Total Bilirubin: 2.4 mg/dL — ABNORMAL HIGH (ref 0.3–1.2)
Total Protein: 8.4 g/dL — ABNORMAL HIGH (ref 6.5–8.1)

## 2019-07-21 LAB — PHOSPHORUS: Phosphorus: 4.3 mg/dL (ref 2.5–4.6)

## 2019-07-21 LAB — CBC
HCT: 36.1 % — ABNORMAL LOW (ref 39.0–52.0)
Hemoglobin: 12 g/dL — ABNORMAL LOW (ref 13.0–17.0)
MCH: 30.4 pg (ref 26.0–34.0)
MCHC: 33.2 g/dL (ref 30.0–36.0)
MCV: 91.4 fL (ref 80.0–100.0)
Platelets: 186 10*3/uL (ref 150–400)
RBC: 3.95 MIL/uL — ABNORMAL LOW (ref 4.22–5.81)
RDW: 14.7 % (ref 11.5–15.5)
WBC: 7 10*3/uL (ref 4.0–10.5)
nRBC: 0 % (ref 0.0–0.2)

## 2019-07-21 LAB — HIV ANTIBODY (ROUTINE TESTING W REFLEX): HIV Screen 4th Generation wRfx: NONREACTIVE

## 2019-07-21 LAB — MAGNESIUM: Magnesium: 2.1 mg/dL (ref 1.7–2.4)

## 2019-07-21 MED ORDER — TRAMADOL HCL 50 MG PO TABS
50.0000 mg | ORAL_TABLET | Freq: Four times a day (QID) | ORAL | Status: AC | PRN
  Administered 2019-07-22 (×2): 50 mg via ORAL
  Filled 2019-07-21 (×2): qty 1

## 2019-07-21 MED ORDER — TRAMADOL HCL 50 MG PO TABS
50.0000 mg | ORAL_TABLET | Freq: Once | ORAL | Status: AC
  Administered 2019-07-21: 50 mg via ORAL
  Filled 2019-07-21: qty 1

## 2019-07-21 MED ORDER — FENTANYL CITRATE (PF) 100 MCG/2ML IJ SOLN
12.5000 ug | Freq: Once | INTRAMUSCULAR | Status: AC
  Administered 2019-07-21: 12.5 ug via INTRAVENOUS
  Filled 2019-07-21: qty 2

## 2019-07-21 NOTE — Progress Notes (Signed)
Patient passed swallow screen with no complications. Diet advanced to soft per NP order.

## 2019-07-21 NOTE — Progress Notes (Signed)
PROGRESS NOTE  [name] [id number] DOB: 1950-11-29 DOA: 07/20/2019 PCP: Shirley, [place], DO   LOS: 1 day   Brief Narrative / Interim history: 69 year old male with history of seizure disorder, alcohol use, liver cirrhosis, CHF, HTN, HLD, history of laryngectomy for laryngeal cancer, hypothyroidism who came into the hospital with falls and possible seizure.  Unfortunately he has a history of alcohol abuse and continues to drink alcohol, per family 1/2-1 full liquid bottle daily but patient reports only 1 beer.  Last drink was the day of admission on 5/16.  He was admitted with progressive right-sided weakness over the last several weeks and worsening falls, worse in the last several days.  He also had an episode shortly before admission when he was found on the floor by the daughter with an unwitnessed fall and also concern for seizure-like activity later that day.  Neurology was consulted and recommended admission to the hospital from [facility] long emergency room to [place] / 24h Interval events: Patient alert, has no complaints today.  He denies any chest pain, no shortness of breath.  Assessment & Plan: Principal Problem Progressive right-sided weakness/recurrent falls/failure to thrive and possible seizure-like activity with history of seizure disorder prior to admission-neurology input appreciated, for unclear reasons he was admitted to [facility] floor but will be transferred to [facility] this afternoon -Started on Keppra, continue -No seizure episodes reported here -We will need continuous EEG per neurology which will be initiated when he gets transferred -MRI of the brain showed acute subarachnoid blood over the right anterior frontal lobe likely due to trauma, and aspirin has been discontinued by neurology.  Active Problems Ongoing alcohol abuse, with intoxication, alcohol level 179 over admission/history of liver cirrhosis -patient was placed on  CIWA, clinically does not appear to be withdrawing this morning and scores have been relatively stable overnight between 0-2.  Continue to monitor. -His AST is slightly elevated compared ALT suggesting ongoing EtOH, total bilirubin 2.4.  He has normal platelets.  There is no clear evidence of cirrhosis decompensation  Significantly displaced olecranon fracture, slightly displaced fracture of the lateral epicondyle of the distal right ulna-EDP discussed with Ortho surgery, Ace wrap and sling immobilizer  Acute kidney injury on kidney disease stage IIIa -baseline creatinine around 1.2-1.3, elevated to 1.6 on admission and improved with fluids and this morning is 1.47.  Avoid nephrotoxins  Essential hypertension-he is not on home antihypertensives, initial blood pressure was low in the ED.  His blood pressure now 130s-150s  Hyponatremia-likely dry, improved with fluids  Hypoglycemia -CBG last night 61, poor p.o. intake/liver disease with impaired gluconeogenesis  Hypothyroidism-continue Synthroid  Recurrent falls, failure to thrive-PT/OT consult  Chronic diastolic CHF-appears euvolemic/dry  Voicebox status post laryngectomy for laryngeal cancer-appears stable  Scheduled Meds: . folic acid  1 mg Oral Daily  . levETIRAcetam  500 mg Oral BID  . levothyroxine  137 mcg Oral QAC breakfast  . LORazepam  0-4 mg Oral Q6H   Followed by  . [START ON 07/22/2019] LORazepam  0-4 mg Oral Q12H  . multivitamin with minerals  1 tablet Oral Daily  . thiamine  100 mg Oral Daily   Or  . thiamine  100 mg Intravenous Daily   Continuous Infusions: . sodium chloride 75 mL/hr at 07/20/19 2122   PRN Meds:.hydrALAZINE, LORazepam **OR** LORazepam, ondansetron **OR** ondansetron (ZOFRAN) IV  DVT prophylaxis: SCDs Code Status: Full code Family Communication: no family at bedside   Status is: Inpatient  Remains inpatient appropriate because:Inpatient level of care appropriate due to severity of illness,  needs transferred to [facility] for continuous EEG/neurology evaluation  Dispo: The patient is from: Home              Anticipated d/c is to: Home              Anticipated d/c date is: 2 days              Patient currently is not medically stable to d/c.  Consultants:  Neurology   Procedures:  None   Microbiology  None   Antimicrobials: None     Objective: Vitals:   07/21/19 0206 07/21/19 0400 07/21/19 0556 07/21/19 1227  BP: (!) 149/90 132/86  (!) 150/82  Pulse:  (!) 105 99 99  Resp:  20  19  Temp: 97.7 F (36.5 C) 98.2 F (36.8 C)  98.2 F (36.8 C)  TempSrc: Oral Oral  Oral  SpO2:  98%  100%  Weight:      Height:        Intake/Output Summary (Last 24 hours) at 07/21/2019 1357 Last data filed at 07/21/2019 1321 Gross per 24 hour  Intake 1340 ml  Output 200 ml  Net 1140 ml   Filed Weights   07/20/19 0930  Weight: 49.9 kg    Examination:  Constitutional: NAD Eyes: no scleral icterus ENMT: Mucous membranes are moist.  Stoma in place Neck: normal, supple Respiratory: clear to auscultation bilaterally, no wheezing, no crackles. Normal respiratory effort. No accessory muscle use.  Cardiovascular: Regular rate and rhythm, no murmurs / rubs / gallops. No LE edema. Good peripheral pulses Abdomen: non distended, no tenderness. Bowel sounds positive.  Musculoskeletal: no clubbing / cyanosis.  Sling to the right elbow Skin: no rashes Neurologic: Nonfocal, equal strength Psychiatric: Normal judgment and insight. Alert and oriented x 3. Normal mood.    Data Reviewed: I have independently reviewed following labs and imaging studies   CBC: Recent Labs  Lab 07/20/19 0954 07/21/19 0048  WBC 7.8 7.0  NEUTROABS 5.0  --   HGB 12.0* 12.0*  HCT 36.4* 36.1*  MCV 90.8 91.4  PLT 285 [id number]   Basic Metabolic Panel: Recent Labs  Lab 07/20/19 0954 07/21/19 0048  NA 133* 135  K 4.1 3.9  CL 96* 98  CO2 22 19*  GLUCOSE 87 61*  BUN 14 18  CREATININE 1.61* 1.47*    CALCIUM 9.3 8.7*  MG  --  2.1  PHOS  --  4.3   Liver Function Tests: Recent Labs  Lab 07/20/19 0954 07/21/19 0048  AST 60* 43*  ALT 38 30  ALKPHOS 90 92  BILITOT 1.1 2.4*  PROT 9.0* 8.4*  ALBUMIN 4.0 3.9   Coagulation Profile: No results for input(s): INR, PROTIME in the last 168 hours. HbA1C: No results for input(s): HGBA1C in the last 72 hours. CBG: Recent Labs  Lab 07/20/19 0950  GLUCAP 77    Recent Results (from the past 240 hour(s))  SARS Coronavirus 2 by RT PCR (hospital order, performed in [facility] hospital lab) Nasopharyngeal Nasopharyngeal Swab     Status: None   Collection Time: 07/20/19  2:35 PM   Specimen: Nasopharyngeal Swab  Result Value Ref Range Status   SARS Coronavirus 2 NEGATIVE NEGATIVE Final    Comment: (NOTE) SARS-CoV-2 target nucleic acids are NOT DETECTED. The SARS-CoV-2 RNA is generally detectable in upper and lower respiratory specimens during the acute phase of infection. The lowest concentration  of SARS-CoV-2 viral copies this assay can detect is 250 copies / mL. A negative result does not preclude SARS-CoV-2 infection and should not be used as the sole basis for treatment or other patient management decisions.  A negative result may occur with improper specimen collection / handling, submission of specimen other than nasopharyngeal swab, presence of viral mutation(s) within the areas targeted by this assay, and inadequate number of viral copies (<250 copies / mL). A negative result must be combined with clinical observations, patient history, and epidemiological information. Fact Sheet for Patients:   StrictlyIdeas.no Fact Sheet for Healthcare Providers: BankingDealers.co.za This test is not yet approved or cleared  by the [place] FDA and has been authorized for detection and/or diagnosis of SARS-CoV-2 by FDA under an Emergency Use Authorization (EUA).  This EUA will remain in  effect (meaning this test can be used) for the duration of the COVID-19 declaration under Section 564(b)(1) of the Act, 21 U.S.C. section 360bbb-3(b)(1), unless the authorization is terminated or revoked sooner. Performed at [facility], [place] [address], [place], [place] [address]      Radiology Studies: MR BRAIN WO CONTRAST  Result Date: 07/20/2019 CLINICAL DATA:  Right-sided weakness. History of seizures. EXAM: MRI HEAD WITHOUT CONTRAST TECHNIQUE: Multiplanar, multiecho pulse sequences of the brain and surrounding structures were obtained without intravenous contrast. COMPARISON:  05/06/2014 FINDINGS: BRAIN: There is acute subarachnoid blood over the anterior right frontal lobe. Multifocal white matter hyperintensity, most commonly due to chronic ischemic microangiopathy. Advanced atrophy for age. No chronic microhemorrhage. Normal midline structures. Subdural hygroma over the left hemisphere. 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 VASCULAR: Major flow voids are preserved. SKULL AND UPPER CERVICAL SPINE: Normal calvarium and skull base. Visualized upper cervical spine and soft tissues are normal. SINUSES/ORBITS: No paranasal sinus fluid levels or advanced mucosal thickening. No mastoid or middle ear effusion. Normal orbits. IMPRESSION: 1. Acute subarachnoid blood over the anterior right frontal lobe. Pattern is compatible with trauma. 2. Advanced atrophy and findings of chronic small vessel disease. 3. Subdural hygroma over the left hemisphere. Critical Value/emergent results were called by telephone at the time of interpretation on 07/20/2019 at 9:00 pm to provider Jeannene Patella, who verbally acknowledged these results. Electronically Signed   By: Ulyses Jarred M.D.   On: 07/20/2019 21:01   Marzetta Board, MD, PhD [facility]  Between 7 am - 7 pm I am available, please contact me via Amion or Securechat  Between 7 pm - 7 am I am not available, please contact night  coverage MD/APP via Amion

## 2019-07-21 NOTE — Plan of Care (Addendum)
Spoke with hospitalist at [facility] transfer to [facility] for further evaluation. Small SAH -likely traumatic seen on MRI. Discontinue aspirin. Further recommendations after clinical exam and further testing when arrives at [place], MD [facility] Pager: [phone] If 7pm to 7am, please call on call as listed on AMION.

## 2019-07-21 NOTE — TOC Progression Note (Signed)
Transition of Care [facility]) - Progression Note    Patient Details  Name: [name] MRN: [id number] Date of Birth: 10/31/1950  Transition of Care [facility]) CM/SW Contact  Robbyn Hodkinson, Juliann Pulse, RN Phone Number: 07/21/2019, 11:37 AM  Clinical Narrative: [place] [facility] orders place, recc PT cons-MD/Nsg aware.     Expected Discharge Plan: Home/Self Care Barriers to Discharge: Continued Medical Work up  Expected Discharge Plan and Services Expected Discharge Plan: Home/Self Care   Discharge Planning Services: CM Consult   Living arrangements for the past 2 months: Single Family Home                                       Social Determinants of Health (SDOH) Interventions    Readmission Risk Interventions No flowsheet data found.

## 2019-07-22 ENCOUNTER — Inpatient Hospital Stay (HOSPITAL_COMMUNITY): Payer: Medicare Other

## 2019-07-22 DIAGNOSIS — Z8719 Personal history of other diseases of the digestive system: Secondary | ICD-10-CM

## 2019-07-22 DIAGNOSIS — F1010 Alcohol abuse, uncomplicated: Secondary | ICD-10-CM

## 2019-07-22 DIAGNOSIS — N179 Acute kidney failure, unspecified: Secondary | ICD-10-CM

## 2019-07-22 DIAGNOSIS — I10 Essential (primary) hypertension: Secondary | ICD-10-CM

## 2019-07-22 DIAGNOSIS — S42401A Unspecified fracture of lower end of right humerus, initial encounter for closed fracture: Secondary | ICD-10-CM

## 2019-07-22 LAB — MRSA PCR SCREENING: MRSA by PCR: NEGATIVE

## 2019-07-22 NOTE — NC FL2 (Signed)
[place] LEVEL OF CARE SCREENING TOOL     IDENTIFICATION  Patient Name: [name] Birthdate: [birth year] Sex: male Admission Date (Current Location): 07/20/2019  [facility] and [place] Number:  Herbalist and Address:  [facility] [place], [place] [address], [place], [place] [address]      Provider Number: [id number]  Attending Physician Name and Address:  Dickie La, MD  Relative Name and Phone Number:       Current Level of Care: Hospital Recommended Level of Care: [place] Prior Approval Number:    Date Approved/Denied:   PASRR Number: [id number]  Discharge Plan: SNF    Current Diagnoses: Patient Active Problem List   Diagnosis Date Noted  . Alcohol abuse   . Seizure ([place]) 07/20/2019  . Fall at home, initial encounter 07/20/2019  . Alcohol abuse with intoxication ([place]) 07/20/2019  . History of cirrhosis of liver 07/20/2019  . CKD (chronic kidney disease), stage III 07/20/2019  . AKI (acute kidney injury) ([place]) 07/20/2019  . Olecranon fracture, right, closed, initial encounter 07/20/2019  . Distal end of ulna fracture, closed 07/20/2019  . Seizure-like activity ([place]) 07/13/2019  . Right sided weakness 07/03/2019  . Numbness of right hand 06/12/2019  . Status post total replacement of right hip 09/15/2016  . Throat cancer ([place])   . Esophageal mass   . Odynophagia   . Protein-calorie malnutrition, severe ([place]) 04/10/2014  . Pancreatitis, chronic ([place]) 05/20/2013  . Primary adrenal insufficiency ([place]) 05/20/2013  . Hyperlipidemia   . Osteoarthritis   . Chronic systolic heart failure ([place]) 01/04/2012  . Alcohol dependence ([place]) 01/04/2012  . 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 Hypothyroidism 10/16/2010  . Malignant neoplasm of larynx ([place]) 01/10/2010  . PULMONARY NODULE 12/31/2007  . Essential hypertension, benign 11/08/2007     Orientation RESPIRATION BLADDER Height & Weight     Self, Time, Situation, Place  Normal Continent Weight: 49.9 kg Height:  5\' 11"  (180.3 cm)  BEHAVIORAL SYMPTOMS/MOOD NEUROLOGICAL BOWEL NUTRITION STATUS    Convulsions/Seizures(Keppra 500 mg BID) Continent Diet(soft with thin liquids)  AMBULATORY STATUS COMMUNICATION OF NEEDS Skin   Limited Assist Verbally (abrasion to rt eye)                       Personal Care Assistance Level of Assistance  Bathing, Feeding, Dressing Bathing Assistance: Maximum assistance Feeding assistance: Limited assistance Dressing Assistance: Maximum assistance     Functional Limitations Info  Sight, Hearing, Speech Sight Info: Adequate Hearing Info: Impaired Speech Info: Impaired(Pt with a stoma but able to speak with it covered)    [place]  PT (By licensed PT), OT (By licensed OT)     PT Frequency: 5x/wk OT Frequency: 5x/wk            Contractures Contractures Info: Not present    Additional Factors Info  Code Status, Allergies Code Status Info: Full Allergies Info: succinylcholine/ tylenol/ vicodin-acetaminophen/ protonix           Current Medications (07/22/2019):  This is the current hospital active medication list Current Facility-Administered Medications  Medication Dose Route Frequency Provider Last Rate Last Admin  . folic acid (FOLVITE) tablet 1 mg  1 mg Oral Daily Li, Na, MD   1 mg at 07/22/19 1013  . hydrALAZINE (APRESOLINE) injection 10 mg  10 mg Intravenous Q6H PRN Merlene Laughter F, NP   10 mg  at 07/21/19 2121  . levETIRAcetam (KEPPRA) tablet 500 mg  500 mg Oral BID Nicoletta Dress, Na, MD   500 mg at 07/22/19 1013  . levothyroxine (SYNTHROID) tablet 137 mcg  137 mcg Oral QAC breakfast Nicoletta Dress, Na, MD   137 mcg at 07/22/19 0530  . LORazepam (ATIVAN) tablet 1-4 mg  1-4 mg Oral Q1H PRN Charlann Lange, MD       Or  . LORazepam (ATIVAN) injection 1-4 mg  1-4 mg Intravenous Q1H PRN Li, Na, MD      . LORazepam (ATIVAN)  tablet 0-4 mg  0-4 mg Oral Q6H Li, Na, MD       Followed by  . LORazepam (ATIVAN) tablet 0-4 mg  0-4 mg Oral Q12H Li, Na, MD      . multivitamin with minerals tablet 1 tablet  1 tablet Oral Daily Nicoletta Dress, Na, MD   1 tablet at 07/22/19 1013  . ondansetron (ZOFRAN) tablet 4 mg  4 mg Oral Q6H PRN Nicoletta Dress, Na, MD       Or  . ondansetron (ZOFRAN) injection 4 mg  4 mg Intravenous Q6H PRN Nicoletta Dress, Na, MD      . thiamine tablet 100 mg  100 mg Oral Daily Li, Na, MD   100 mg at 07/22/19 1013   Or  . thiamine (B-1) injection 100 mg  100 mg Intravenous Daily Charlann Lange, MD         Discharge Medications: Please see discharge summary for a list of discharge medications.  Relevant Imaging Results:  Relevant Lab Results:   Additional Information SS#: [id number]  Pollie Friar, RN

## 2019-07-22 NOTE — Progress Notes (Signed)
Patients daughter, [name], updated on transfer to [facility].

## 2019-07-22 NOTE — Progress Notes (Signed)
Family Medicine Teaching Service Daily Progress Note Intern Pager: [phone]  Patient name: [name] Medical record number: [id number] Date of birth: [birth year] Age: 69 y.o. Gender: male  Primary Care Provider: Shirley, [place], DO Consultants: Neurology, Ortho Code Status: Full  Pt Overview and Major Events to Date:  07/20/19: Admitted to [facility]  5/17: Transferred to [facility]  5/18: Transferred to care under FPTS    Assessment and Plan:  [name] is a 69 y.o. male with medical history significant of seizures, alcohol abuse, liver cirrhosis, CHF, HTN, HLD, CKD, HOH, voicebox status post laryngectomy for laryngeal cancer, hypothyroidism, who presented with falls and possible seizure.  Subarachnoid hemorrhage  Left Subdural Hygroma  Right sided weakness  hx of Sezuries  Patient with progressive right-sided weakness.  History of recurrent falls.  Patient transferred from [facility] for neurology evaluation.  Patient may need continuous EEG.  MRI of the brain showed acute subarachnoid hemorrhage over the right anterior frontal lobe likely due to trauma.  Additionally a subdural hygroma over the left hemisphere was seen.  Waiting further recommendations from neurology.  -Neurology consulted, patient recommendations -Continue Keppra -Monitor for seizure activity -Hold aspirin   Alcohol abuse  Hx of Liver Cirrhosis  Chronic. EtOH level 179 on admission. History of liver cirrhosis. Most recent CIWA's are 0.  Patient hypertensive this morning to be secondary to withdrawal.  AST/ALT 43/30.  Platelets were normal on admission.  Likely not in acute decompensated cirrhosis.  - Monitor CIWAs - AM BMP    Olecranon fracture  Lateral epicondyle fracture  Fall Show a significantly displaced olecranon fracture and a slightly displaced fracture of lateral epicondyle that is distal to the right ulna.  While in the ED at [facility] long Ortho was consulted.  Patient placed  in Ace wrap and sling immobilizer in ED.  -Consult orthopedic surgery    AKI  Chronic kidney disease stage IIIa Baseline creatinine around 1.2-1.3. Elevated to 1.6 on admission. Likely improving s/p IVF. - Follow up AM BMP  -  Avoid nephrotoxic agents   Essential hypertension He is not on home antihypertensives. Was on Coreg, amlodipine and Clonidine previously. Hypertensive this morning 166/97.   - Consider restarting Coreg    Hypoglycemia  Glucose on CMP 61 likely secondary to decreased po intake - follow up on AM BMP   Hypothyroidism Takes Synthroid - Continue home medication   Recurrent falls, failure to thrive-PT/OT consult  HFpEF  Appears euvolemic on exam.    Voicebox status post laryngectomy for laryngeal cancer Stable. Provide respiratory care as needed.      FEN/GI: soft diet, replete electrolytes as needed  PPx: SCDs   Disposition: pending neurological evaluation   Subjective:  Patient denies pain this morning.   Objective: Temp:  [98 F (36.7 C)-98.7 F (37.1 C)] 98.5 F (36.9 C) (05/18 0836) Pulse Rate:  [94-101] 97 (05/18 0836) Resp:  [18-24] 20 (05/18 0836) BP: (150-178)/(82-100) 166/97 (05/18 0836) SpO2:  [98 %-100 %] 99 % (05/18 0836) Physical Exam: General: lying in bed, minimal speech this morning (points to laryngectomy scar) Cardiovascular: regular rate and rhythm  Respiratory: clear to ascultation bilaterally  Abdomen: soft, non-tender Extremities: thin LE, sling on RUE, bandage on LUE   Laboratory: Recent Labs  Lab 07/20/19 0954 07/21/19 0048  WBC 7.8 7.0  HGB 12.0* 12.0*  HCT 36.4* 36.1*  PLT 285 186   Recent Labs  Lab 07/20/19 0954 07/21/19 0048  NA 133* 135  K 4.1 3.9  CL 96* 98  CO2 22 19*  BUN 14 18  CREATININE 1.61* 1.47*  CALCIUM 9.3 8.7*  PROT 9.0* 8.4*  BILITOT 1.1 2.4*  ALKPHOS 90 92  ALT 38 30  AST 60* 43*  GLUCOSE 87 61*      Imaging/Diagnostic Tests: DG Elbow Complete Right  Result  Date: 07/20/2019 CLINICAL DATA:  Fall, swelling. EXAM: RIGHT ELBOW - COMPLETE 3+ VIEW COMPARISON:  None. FINDINGS: Significantly displaced fracture of the olecranon, with approximately 1.6 cm cortical diastasis posteriorly. Additional slightly displaced fracture within the lateral epicondyle of the distal RIGHT U. Probable nondisplaced fracture extension through the supracondylar humerus to the medial epicondyle. Associated joint effusion is present. Associated soft tissue swelling/edema posteriorly. Proximal radius appears intact and normally aligned. IMPRESSION: 1. Significantly displaced fracture of the olecranon, with approximately 1.6 cm cortical diastasis posteriorly. 2. Additional slightly displaced fracture within the lateral epicondyle of the distal RIGHT ulna. Probable nondisplaced fracture extension through the supracondylar humerus to the medial epicondyle. 3. Associated joint effusion and soft tissue swelling. Electronically Signed   By: Franki Cabot M.D.   On: 07/20/2019 10:52   CT Head Wo Contrast  Result Date: 07/20/2019 CLINICAL DATA:  [name] daughter reports that he may have had a seizure at 29 today and foound him on the floor and is not sure if he had a seizure or not and the second fall was proceeded by a seizure that she witnessed. Patient has a right head injury near the right eye and right elbow is swollen and painful. Patient already has a history of right -sided weakness EXAM: CT HEAD WITHOUT CONTRAST CT CERVICAL SPINE WITHOUT CONTRAST TECHNIQUE: Multidetector CT imaging of the head and cervical spine was performed following the standard protocol without intravenous contrast. Multiplanar CT image reconstructions of the cervical spine were also generated. COMPARISON:  None. FINDINGS: CT HEAD FINDINGS Brain: The ventricles are normal in size and configuration. There are no parenchymal masses. There is no evidence of an infarct. Mild subcortical white matter patchy hypoattenuation is  noted consistent with chronic microvascular ischemic change. There is a hyperattenuating mass along the plain image sphenoidale measuring 14 x 7 x 13 mm, consistent with a meningioma. There is a widened extra-axial space over the left frontoparietal lobe, maximum width of 1 cm, of CSF attenuation. There is slight shift of the midline structures to the right of 2-3 mm. No other extra-axial abnormalities. No acute intracranial hemorrhage. Vascular: No hyperdense vessel or unexpected calcification. Skull: Normal. Negative for fracture or focal lesion. Sinuses/Orbits: Old right medial orbital wall fracture. Globes and orbits are otherwise unremarkable. Visualized sinuses and mastoid air cells are clear. Other: None. CT CERVICAL SPINE FINDINGS Alignment: Mild reversal the normal cervical lordosis which may be positional. No spondylolisthesis/subluxation. Skull base and vertebrae: No acute fracture. No primary bone lesion or focal pathologic process. Soft tissues and spinal canal: No prevertebral fluid or swelling. No visible canal hematoma. Disc levels: Moderate to marked loss of disc height at C3-C4, mild loss of disc height at C4-C5, moderate to marked loss of disc height at C5-C6 and C6-C7. Mild spondylotic disc bulging. No convincing disc herniation. Upper chest: Tracheostomy tube in place. Mild scarring at the lung apices. Other: There are changes consistent with a previous laryngectomy and radical neck dissection. No discrete soft tissue mass or enlarged lymph node. IMPRESSION: HEAD CT 1. No acute intracranial abnormalities. 2. Widened extra-axial space on the left. Suspect a chronic hygroma. 3. Planum sphenoidale meningioma, 1.4 cm in  size. 4. Mild chronic microvascular ischemic change. CERVICAL CT 1. No fracture or acute finding. 2. Degenerative changes as described. 3. Changes consistent with a previous laryngectomy and radical neck dissection. Electronically Signed   By: Lajean Manes M.D.   On: 07/20/2019  10:45   CT Cervical Spine Wo Contrast  Result Date: 07/20/2019 CLINICAL DATA:  [name] daughter reports that he may have had a seizure at 31 today and foound him on the floor and is not sure if he had a seizure or not and the second fall was proceeded by a seizure that she witnessed. Patient has a right head injury near the right eye and right elbow is swollen and painful. Patient already has a history of right -sided weakness EXAM: CT HEAD WITHOUT CONTRAST CT CERVICAL SPINE WITHOUT CONTRAST TECHNIQUE: Multidetector CT imaging of the head and cervical spine was performed following the standard protocol without intravenous contrast. Multiplanar CT image reconstructions of the cervical spine were also generated. COMPARISON:  None. FINDINGS: CT HEAD FINDINGS Brain: The ventricles are normal in size and configuration. There are no parenchymal masses. There is no evidence of an infarct. Mild subcortical white matter patchy hypoattenuation is noted consistent with chronic microvascular ischemic change. There is a hyperattenuating mass along the plain image sphenoidale measuring 14 x 7 x 13 mm, consistent with a meningioma. There is a widened extra-axial space over the left frontoparietal lobe, maximum width of 1 cm, of CSF attenuation. There is slight shift of the midline structures to the right of 2-3 mm. No other extra-axial abnormalities. No acute intracranial hemorrhage. Vascular: No hyperdense vessel or unexpected calcification. Skull: Normal. Negative for fracture or focal lesion. Sinuses/Orbits: Old right medial orbital wall fracture. Globes and orbits are otherwise unremarkable. Visualized sinuses and mastoid air cells are clear. Other: None. CT CERVICAL SPINE FINDINGS Alignment: Mild reversal the normal cervical lordosis which may be positional. No spondylolisthesis/subluxation. Skull base and vertebrae: No acute fracture. No primary bone lesion or focal pathologic process. Soft tissues and spinal canal:  No prevertebral fluid or swelling. No visible canal hematoma. Disc levels: Moderate to marked loss of disc height at C3-C4, mild loss of disc height at C4-C5, moderate to marked loss of disc height at C5-C6 and C6-C7. Mild spondylotic disc bulging. No convincing disc herniation. Upper chest: Tracheostomy tube in place. Mild scarring at the lung apices. Other: There are changes consistent with a previous laryngectomy and radical neck dissection. No discrete soft tissue mass or enlarged lymph node. IMPRESSION: HEAD CT 1. No acute intracranial abnormalities. 2. Widened extra-axial space on the left. Suspect a chronic hygroma. 3. 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 4. Mild chronic microvascular ischemic change. CERVICAL CT 1. No fracture or acute finding. 2. Degenerative changes as described. 3. Changes consistent with a previous laryngectomy and radical neck dissection. Electronically Signed   By: Lajean Manes M.D.   On: 07/20/2019 10:45   MR BRAIN WO CONTRAST  Result Date: 07/20/2019 CLINICAL DATA:  Right-sided weakness. History of seizures. EXAM: MRI HEAD WITHOUT CONTRAST TECHNIQUE: Multiplanar, multiecho pulse sequences of the brain and surrounding structures were obtained without intravenous contrast. COMPARISON:  05/06/2014 FINDINGS: BRAIN: There is acute subarachnoid blood over the anterior right frontal lobe. Multifocal white matter hyperintensity, most commonly due to chronic ischemic microangiopathy. Advanced atrophy for age. No chronic microhemorrhage. Normal midline structures. Subdural hygroma over the left hemisphere. 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 VASCULAR: Major flow voids are preserved. SKULL AND UPPER CERVICAL SPINE: Normal calvarium  and skull base. Visualized upper cervical spine and soft tissues are normal. SINUSES/ORBITS: No paranasal sinus fluid levels or advanced mucosal thickening. No mastoid or middle ear effusion. Normal orbits. IMPRESSION: 1. Acute subarachnoid  blood over the anterior right frontal lobe. Pattern is compatible with trauma. 2. Advanced atrophy and findings of chronic small vessel disease. 3. Subdural hygroma over the left hemisphere. Critical Value/emergent results were called by telephone at the time of interpretation on 07/20/2019 at 9:00 pm to provider Jeannene Patella, who verbally acknowledged these results. Electronically Signed   By: Ulyses Jarred M.D.   On: 07/20/2019 21:01    Lyndee Hensen, DO 07/22/2019, 8:39 AM PGY-1, [facility] Intern pager: [phone], text pages welcome

## 2019-07-22 NOTE — Progress Notes (Signed)
EEG completed, results pending. 

## 2019-07-22 NOTE — Procedures (Signed)
Patient Name: [name]  MRN: [id number]  Epilepsy Attending: Lora Havens  Referring Physician/Provider: Dr Charlann Lange Date: 07/22/2019 Duration: 24.30 mins  Patient history: 69 year old man with history of right anterior frontal region [facility] and left subdural hygroma presenting for evaluation of fall and seizure-like activity.  Has a history of seizure, has been started on Keppra but has had increased falls and increasing intermittent right-sided weakness over the past week. EEG to evaluate for seizures.   Level of alertness: Awake  AEDs during EEG study: LEV, Ativan  Technical aspects: This EEG study was done with scalp electrodes positioned according to the 10-20 International system of electrode placement. Electrical activity was acquired at a sampling rate of 500Hz  and reviewed with a high frequency filter of 70Hz  and a low frequency filter of 1Hz . EEG data were recorded continuously and digitally stored.   Description: The posterior dominant rhythm consists of 8 Hz activity of moderate voltage (25-35 uV) seen predominantly in posterior head regions, symmetric and reactive to eye opening and eye closing. Hyperventilation and photic stimulation were not performed.     IMPRESSION: This study is within normal limits. No seizures or epileptiform discharges were seen throughout the recording.  Makenzie Weisner [name]

## 2019-07-22 NOTE — Evaluation (Signed)
Occupational Therapy Evaluation Patient Details Name: [name] MRN: [id number] DOB: Jul 28, 1950 Today's Date: 07/22/2019    History of Present Illness 69 year old male with history of seizure disorder, alcohol use, liver cirrhosis, CHF, HTN, HLD, history of laryngectomy for laryngeal cancer, hypothyroidism who came into the hospital with falls and possible seizure.  Unfortunately he has a history of alcohol abuse and continues to drink alcohol. Right elbow x-ray showed S"ignificantly displaced fracture of the olecranon, slightly displaced fracture within the lateral epicondyle of the distal RIGHT ulna. Small SAH -likely traumatic seen on MRI.   Clinical Impression   PTA Pt independent in home environment - used DME PRN but most of the time functioned without. Pt today is mod to max A for most tasks due to RUE being immobilized and painful (re-fit and educated on sling). He is right hand dominant and requires assist for all BUE tasks. INitially Pt mod A +2 safety and able to progress to min A +2 for transfers and mobility. Pt will require skilled OT in the acute setting as well as afterwards at the SNF level to maximize safety and independence in ADL and functional transfers. Next session to focus on compensatory strategies for BUE tasks.     Follow Up Recommendations  SNF;Supervision/Assistance - 24 hour    Equipment Recommendations  Other (comment)(defer to next venue of care)    Recommendations for Other Services       Precautions / Restrictions Precautions Precautions: Fall Restrictions Weight Bearing Restrictions: No      Mobility Bed Mobility               General bed mobility comments: sitting EOB with PT when OT entered the room  Transfers Overall transfer level: Needs assistance Equipment used: 2 person hand held assist Transfers: Sit to/from Stand Sit to Stand: Min assist;+2 safety/equipment;Mod assist         General transfer comment: initially  mod A +2 safety progressed to min A +1: gait belt for safety    Balance Overall balance assessment: Needs assistance Sitting-balance support: Single extremity supported;Feet supported Sitting balance-[name]: Fair     Standing balance support: Single extremity supported Standing balance-[name]: Poor                             ADL either performed or assessed with clinical judgement   ADL Overall ADL's : Needs assistance/impaired Eating/Feeding: Moderate assistance;Sitting Eating/Feeding Details (indicate cue type and reason): assist for all tasks that used to require BUE, cutting up food, opening containers, scooping - Pt also using LUE which is non-dominant so more difficult for him Grooming: Moderate assistance;Sitting;Set up Grooming Details (indicate cue type and reason): set up for washing face, but for tasks that require BUE he requires mod A Upper Body Bathing: Moderate assistance   Lower Body Bathing: Maximal assistance   Upper Body Dressing : Maximal assistance;Sitting Upper Body Dressing Details (indicate cue type and reason): adjusted sling to fit properly during session Lower Body Dressing: Maximal assistance   Toilet Transfer: Moderate assistance;Minimal assistance;+2 for safety/equipment Toilet Transfer Details (indicate cue type and reason): initially mod A +2 safety - able to progress to min A +1 for safety  Toileting- Clothing Manipulation and Hygiene: Moderate assistance       Functional mobility during ADLs: Minimal assistance;Moderate assistance;+2 for safety/equipment(initially mod A +2 safety progressed to min A +1) General ADL Comments: all tasks that were BUE tasks Pt  now requires assist - and needs further education on compensatory strategies     Vision Patient Visual Report: No change from baseline       Perception     Praxis      Pertinent Vitals/Pain Pain Assessment: Faces Faces Pain Scale: Hurts little more Pain  Location: R elbow Pain Descriptors / Indicators: Discomfort;Grimacing Pain Intervention(s): Monitored during session;Repositioned;Other (comment)(educated on exercises of fingers to help with swelling)     Hand Dominance Right   Extremity/Trunk Assessment Upper Extremity Assessment Upper Extremity Assessment: RUE deficits/detail RUE Deficits / Details: in splint/acewrap and sling RUE: Unable to fully assess due to immobilization RUE Sensation: (fingers with appropriate sensation) RUE Coordination: decreased gross motor;decreased fine motor   Lower Extremity Assessment Lower Extremity Assessment: Defer to PT evaluation   Cervical / Trunk Assessment Cervical / Trunk Assessment: Normal   Communication Communication Communication: Other (comment)(stoma, Pt mostly mouths out - but will cover stoma to talk)   Cognition Arousal/Alertness: Awake/alert Behavior During Therapy: WFL for tasks assessed/performed Overall Cognitive Status: Within Functional Limits for tasks assessed                                 General Comments: Pt pleasant, cooperative throughout session   General Comments  Pt open to attending AA    Exercises Exercises: Other exercises Other Exercises Other Exercises: encouraged Pt to wiggle fingers for edema management   Shoulder Instructions      Home Living Family/patient expects to be discharged to:: Skilled nursing facility Living Arrangements: Children(daughter) Available Help at Discharge: Family;Available PRN/intermittently                         Home Equipment: Walker - 2 wheels;Cane - single point          Prior Functioning/Environment Level of Independence: Independent        Comments: history of falls        OT Problem List: Impaired balance (sitting and/or standing);Decreased safety awareness;Pain      OT Treatment/Interventions: Self-care/ADL training;Therapeutic exercise;DME and/or AE  instruction;Therapeutic activities;Manual therapy;Patient/family education;Balance training    OT Goals(Current goals can be found in the care plan section) Acute Rehab OT Goals Patient Stated Goal: get independent again OT Goal Formulation: With patient Time For Goal Achievement: 08/05/19 Potential to Achieve Goals: Good ADL Goals Pt Will Perform Eating: with set-up;sitting Pt Will Perform Grooming: with set-up;sitting Pt Will Perform Upper Body Dressing: with min guard assist;sitting Pt Will Perform Lower Body Dressing: with min assist;sit to/from stand Pt Will Transfer to Toilet: with supervision;ambulating Pt Will Perform Toileting - Clothing Manipulation and hygiene: with supervision;sit to/from stand  OT Frequency: Min 2X/week   Barriers to D/C:            Co-evaluation PT/OT/SLP Co-Evaluation/Treatment: Yes Reason for Co-Treatment: Necessary to address cognition/behavior during functional activity;For patient/therapist safety;To address functional/ADL transfers PT goals addressed during session: Mobility/safety with mobility;Balance OT goals addressed during session: ADL's and self-care      AM-PAC OT "6 Clicks" Daily Activity     Outcome Measure Help from another person eating meals?: A Lot Help from another person taking care of personal grooming?: A Lot Help from another person toileting, which includes using toliet, bedpan, or urinal?: A Lot Help from another person bathing (including washing, rinsing, drying)?: A Lot Help from another person to put on and taking off regular upper body  clothing?: A Lot Help from another person to put on and taking off regular lower body clothing?: A Lot 6 Click Score: 12   End of Session Equipment Utilized During Treatment: Gait belt;Other (comment)(sling) Nurse Communication: Mobility status;Precautions;Weight bearing status  Activity Tolerance: Patient tolerated treatment well Patient left: in chair;with call bell/phone within  reach;with chair alarm set(set up to eat breakfast (everything cut up, opened, seasoned)  OT Visit Diagnosis: Unsteadiness on feet (R26.81);Other abnormalities of gait and mobility (R26.89);Repeated falls (R29.6);History of falling (Z91.81);Muscle weakness (generalized) (M62.81);Adult, failure to thrive (R62.7);Pain Pain - Right/Left: Right Pain - part of body: Arm                Time: [id number] OT Time Calculation (min): 31 min Charges:  OT General Charges $OT Visit: 1 Visit OT Evaluation $OT Eval Moderate Complexity: [place] OTR/L Acute Rehabilitation Services Pager: [phone] Office: [place] 07/22/2019, 1:01 PM

## 2019-07-22 NOTE — TOC Progression Note (Addendum)
Transition of Care [facility]) - Progression Note    Patient Details  Name: Genie Scheler MRN: [id number] Date of Birth: May 03, 1950  Transition of [facility]) CM/SW Contact  Pollie Friar, RN Phone Number: 07/22/2019, 11:36 AM  Clinical Narrative:    Pt is from home with family. He states his daughter provides him his medications and does the transportation. He has supervision most of the time.  DME at home: 3 n 1, shower seat, cane, walker.  TOC following for d/c needs.   1645: Recommendations are for SNF. Pt and daughter are in agreement and would like him faxed out in the [facility] area. FL2 completed.  TOC will follow up with bed choices in am.  Expected Discharge Plan: Home/Self Care Barriers to Discharge: Continued Medical Work up  Expected Discharge Plan and Services Expected Discharge Plan: Home/Self Care   Discharge Planning Services: CM Consult   Living arrangements for the past 2 months: Single Family Home                                       Social Determinants of Health (SDOH) Interventions    Readmission Risk Interventions No flowsheet data found.

## 2019-07-22 NOTE — Evaluation (Signed)
Physical Therapy Evaluation Patient Details Name: [name] MRN: [id number] DOB: Jan 04, 1951 Today's Date: 07/22/2019   History of Present Illness  69 year old male with history of seizure disorder, alcohol use, liver cirrhosis, CHF, HTN, HLD, history of laryngectomy for laryngeal cancer, hypothyroidism who came into the hospital with falls and possible seizure.  Unfortunately he has a history of alcohol abuse and continues to drink alcohol. Right elbow x-ray showed S"ignificantly displaced fracture of the olecranon, slightly displaced fracture within the lateral epicondyle of the distal RIGHT ulna. Small SAH -likely traumatic seen on MRI.  Clinical Impression  Pt admitted with above diagnosis. Pt independent PTA but has had recurrent falls. Pt lives with his daughter but reports she is not with him at all times. Pt ambulated within room with min A +2 and short, shuffling steps. Mildly unsteady, especially with turning. Given that pt does not have use of dominant UE and has balance impairment, recommend SNF for rehab.  Pt currently with functional limitations due to the deficits listed below (see PT Problem List). Pt will benefit from skilled PT to increase their independence and safety with mobility to allow discharge to the venue listed below.       Follow Up Recommendations SNF;Supervision/Assistance - 24 hour    Equipment Recommendations  Cane    Recommendations for Other Services       Precautions / Restrictions Precautions Precautions: Fall Required Braces or Orthoses: Sling Restrictions Weight Bearing Restrictions: No      Mobility  Bed Mobility Overal bed mobility: Needs Assistance Bed Mobility: Supine to Sit     Supine to sit: Min assist     General bed mobility comments: vc's to not use RUE, min A to scoot without use of RUE  Transfers Overall transfer level: Needs assistance Equipment used: 2 person hand held assist Transfers: Sit to/from Stand Sit to  Stand: Min assist;+2 safety/equipment;Mod assist         General transfer comment: initially mod A +2 safety progressed to min A +1: gait belt for safety  Ambulation/Gait Ambulation/Gait assistance: Min assist Gait Distance (Feet): 20 Feet Assistive device: None Gait Pattern/deviations: Step-through pattern;Decreased stride length Gait velocity: decreased Gait velocity interpretation: <1.8 ft/sec, indicate of risk for recurrent falls General Gait Details: pt began with 2 person HHA but progressed to min A to gait belt. Began with very short, shuffling steps. Was able to increase step length with vc's but remained very cautious. Mildly unsteady with turning   Stairs            Wheelchair Mobility    Modified Rankin (Stroke Patients Only)       Balance Overall balance assessment: Needs assistance Sitting-balance support: Single extremity supported;Feet supported Sitting balance-[name]: Fair     Standing balance support: Single extremity supported Standing balance-[name]: Poor Standing balance comment: UE support needed for safety                             Pertinent Vitals/Pain Pain Assessment: Faces Faces Pain Scale: Hurts little more Pain Location: R elbow Pain Descriptors / Indicators: Discomfort;Grimacing Pain Intervention(s): Limited activity within patient's tolerance;Monitored during session    Home Living Family/patient expects to be discharged to:: Skilled nursing facility Living Arrangements: Children Available Help at Discharge: Family;Available PRN/intermittently Type of Home: House Home Access: Stairs to enter Entrance Stairs-Rails: Right Entrance Stairs-Number of Steps: 2 Home Layout: One level Home Equipment: Walker - 2 wheels;Cane -  single point Additional Comments: pt lives with daughter, he reports that she is not with him all of the time    Prior Function Level of Independence: Independent         Comments:  history of falls     Hand Dominance   Dominant Hand: Right    Extremity/Trunk Assessment   Upper Extremity Assessment Upper Extremity Assessment: Defer to OT evaluation RUE Deficits / Details: in splint/acewrap and sling RUE: Unable to fully assess due to immobilization RUE Sensation: (fingers with appropriate sensation) RUE Coordination: decreased gross motor;decreased fine motor    Lower Extremity Assessment Lower Extremity Assessment: Overall WFL for tasks assessed    Cervical / Trunk Assessment Cervical / Trunk Assessment: Normal  Communication   Communication: Other (comment)(stoma, Pt mostly mouths out - but will cover stoma to talk)  Cognition Arousal/Alertness: Awake/alert Behavior During Therapy: WFL for tasks assessed/performed Overall Cognitive Status: Within Functional Limits for tasks assessed                                 General Comments: Pt pleasant, cooperative throughout session      General Comments General comments (skin integrity, edema, etc.): HR 110-135 bpm, SpO2 93% on RA.  Pt open to attending AA. Also agreeable to SNF for rehab while he is unable to use RUE    Exercises Other Exercises Other Exercises: encouraged Pt to wiggle fingers for edema management   Assessment/Plan    PT Assessment Patient needs continued PT services  PT Problem List Decreased balance;Decreased mobility;Decreased knowledge of precautions;Pain;Decreased safety awareness       PT Treatment Interventions DME instruction;Gait training;Functional mobility training;Therapeutic activities;Therapeutic exercise;Balance training;Patient/family education    PT Goals (Current goals can be found in the Care Plan section)  Acute Rehab PT Goals Patient Stated Goal: get independent again PT Goal Formulation: With patient Time For Goal Achievement: 08/05/19 Potential to Achieve Goals: Good    Frequency Min 3X/week   Barriers to discharge Decreased caregiver  support      Co-evaluation PT/OT/SLP Co-Evaluation/Treatment: Yes Reason for Co-Treatment: Complexity of the patient's impairments (multi-system involvement);For patient/therapist safety PT goals addressed during session: Mobility/safety with mobility;Balance OT goals addressed during session: ADL's and self-care       AM-PAC PT "6 Clicks" Mobility  Outcome Measure Help needed turning from your back to your side while in a flat bed without using bedrails?: A Lot Help needed moving from lying on your back to sitting on the side of a flat bed without using bedrails?: A Little Help needed moving to and from a bed to a chair (including a wheelchair)?: A Little Help needed standing up from a chair using your arms (e.g., wheelchair or bedside chair)?: A Little Help needed to walk in hospital room?: A Lot Help needed climbing 3-5 steps with a railing? : Total 6 Click Score: 14    End of Session Equipment Utilized During Treatment: Gait belt Activity Tolerance: Patient tolerated treatment well Patient left: in chair;with call bell/phone within reach;with chair alarm set Nurse Communication: Mobility status PT Visit Diagnosis: Unsteadiness on feet (R26.81);History of falling (Z91.81);Repeated falls (R29.6);Pain Pain - Right/Left: Right Pain - part of body: Arm    Time: [id number] PT Time Calculation (min) (ACUTE ONLY): 30 min   Charges:   PT Evaluation $PT Eval Moderate Complexity: 1 [place], PT  Acute Rehab  Services  Pager [phone] Office [place] 07/22/2019, 1:32 PM

## 2019-07-22 NOTE — Progress Notes (Signed)
Patient unavailable at this time; will do EEG/LTM later today as schedule permits.

## 2019-07-22 NOTE — Progress Notes (Signed)
Arrived from [facility]. Alert and oriented. Has stoma. Mouth wording at times. On low bed. Call light within reach.

## 2019-07-22 NOTE — Progress Notes (Signed)
[name], male   DOB: Oct 20, 1950, 69 y.o.   MRN: [id number]  Apologies for late consult, ortho surgeon under impression pt was to be discharged. Plan on non-operative treatment, sling and NWB for now. Full consult note to follow in AM.    Lisette Abu, PA-C Orthopedic Surgery [phone]

## 2019-07-22 NOTE — Progress Notes (Signed)
vLTM EEG started following spot EEG. Notified Neuro

## 2019-07-22 NOTE — Progress Notes (Addendum)
Neurology Progress Note   S:// Seen and examined. No acute complaints Right arm immobilized to treat the fracture.   O:// Current vital signs: BP (!) 166/97 (BP Location: Left Arm)   Pulse 97   Temp 98.5 F (36.9 C) (Oral)   Resp 20   Ht 5\' 11"  (1.803 m)   Wt 49.9 kg   SpO2 99%   BMI 15.34 kg/m  Vital signs in last 24 hours: Temp:  [98 F (36.7 C)-98.7 F (37.1 C)] 98.5 F (36.9 C) (05/18 0836) Pulse Rate:  [94-101] 97 (05/18 0836) Resp:  [18-24] 20 (05/18 0836) BP: (150-178)/(82-100) 166/97 (05/18 0836) SpO2:  [98 %-100 %] 99 % (05/18 0836) General: Awake alert no distress HEENT: Normocephalic, atraumatic, stoma in place. Cardiovascular: Regular rhythm Respiratory: Chest is clear Extremities: No edema Neurologic exam Awake alert oriented x3 Speaks through his stoma No dysarthria No aphasia Cranial nerves: Pupils equal round react light, extraocular movements intact, visual fields full, no facial weakness noticed. Motor exam: Right upper extremity in a immobilizer but otherwise full strength all over Sensation: Intact all over Coordination intact on the left upper, left lower and right lower extremity.  Unable to test on the right upper extremity.   Medications  Current Facility-Administered Medications:  .  folic acid (FOLVITE) tablet 1 mg, 1 mg, Oral, Daily, Li, Na, MD, 1 mg at 07/22/19 1013 .  hydrALAZINE (APRESOLINE) injection 10 mg, 10 mg, Intravenous, Q6H PRN, Merlene Laughter F, NP, 10 mg at 07/21/19 2121 .  levETIRAcetam (KEPPRA) tablet 500 mg, 500 mg, Oral, BID, Li, Na, MD, 500 mg at 07/22/19 1013 .  levothyroxine (SYNTHROID) tablet 137 mcg, 137 mcg, Oral, QAC breakfast, Nicoletta Dress, Na, MD, 137 mcg at 07/22/19 0530 .  LORazepam (ATIVAN) tablet 1-4 mg, 1-4 mg, Oral, Q1H PRN **OR** LORazepam (ATIVAN) injection 1-4 mg, 1-4 mg, Intravenous, Q1H PRN, Nicoletta Dress, Na, MD .  LORazepam (ATIVAN) tablet 0-4 mg, 0-4 mg, Oral, Q6H **FOLLOWED BY** LORazepam (ATIVAN) tablet 0-4 mg, 0-4  mg, Oral, Q12H, Li, Na, MD .  multivitamin with minerals tablet 1 tablet, 1 tablet, Oral, Daily, Li, Na, MD, 1 tablet at 07/22/19 1013 .  ondansetron (ZOFRAN) tablet 4 mg, 4 mg, Oral, Q6H PRN **OR** ondansetron (ZOFRAN) injection 4 mg, 4 mg, Intravenous, Q6H PRN, Li, Na, MD .  thiamine tablet 100 mg, 100 mg, Oral, Daily, 100 mg at 07/22/19 1013 **OR** thiamine (B-1) injection 100 mg, 100 mg, Intravenous, Daily, Li, Na, MD Labs CBC    Component Value Date/Time   WBC 7.0 07/21/2019 0048   RBC 3.95 (L) 07/21/2019 0048   HGB 12.0 (L) 07/21/2019 0048   HGB 11.0 (L) 07/08/2019 1639   HGB 12.4 (L) 11/04/2009 1559   HCT 36.1 (L) 07/21/2019 0048   HCT 32.2 (L) 07/08/2019 1639   HCT 38.0 (L) 11/04/2009 1559   PLT 186 07/21/2019 0048   PLT 220 07/08/2019 1639   MCV 91.4 07/21/2019 0048   MCV 89 07/08/2019 1639   MCV 91.2 11/04/2009 1559   MCH 30.4 07/21/2019 0048   MCHC 33.2 07/21/2019 0048   RDW 14.7 07/21/2019 0048   RDW 14.7 07/08/2019 1639   RDW 17.2 (H) 11/04/2009 1559   LYMPHSABS 1.8 07/20/2019 0954   LYMPHSABS 2.1 11/04/2009 1559   MONOABS 0.8 07/20/2019 0954   MONOABS 0.4 11/04/2009 1559   EOSABS 0.2 07/20/2019 0954   EOSABS 0.1 11/04/2009 1559   BASOSABS 0.0 07/20/2019 0954   BASOSABS 0.0 11/04/2009 1559    CMP  Component Value Date/Time   NA 135 07/21/2019 0048   NA 138 07/08/2019 1639   K 3.9 07/21/2019 0048   CL 98 07/21/2019 0048   CL 103 08/21/2014 0000   CO2 19 (L) 07/21/2019 0048   GLUCOSE 61 (L) 07/21/2019 0048   BUN 18 07/21/2019 0048   BUN 15 07/08/2019 1639   CREATININE 1.47 (H) 07/21/2019 0048   CREATININE 0.65 07/28/2014 1144   CALCIUM 8.7 (L) 07/21/2019 0048   CALCIUM 8.9 08/21/2014 0000   PROT 8.4 (H) 07/21/2019 0048   PROT 7.7 07/08/2019 1639   PROT 7.3 08/21/2014 0000   ALBUMIN 3.9 07/21/2019 0048   ALBUMIN 3.7 (L) 07/08/2019 1639   ALBUMIN 3.3 08/21/2014 0000   AST 43 (H) 07/21/2019 0048   ALT 30 07/21/2019 0048   ALKPHOS 92 07/21/2019  0048   BILITOT 2.4 (H) 07/21/2019 0048   BILITOT 1.1 07/08/2019 1639   GFRNONAA 49 (L) 07/21/2019 0048   GFRAA 57 (L) 07/21/2019 0048    glycosylated hemoglobin  Lipid Panel     Component Value Date/Time   CHOL 191 02/12/2018 0940   TRIG 59 02/12/2018 0940   HDL 77 02/12/2018 0940   CHOLHDL 2.5 02/12/2018 0940   CHOLHDL 4.7 12/04/2012 0850   VLDL 16 12/04/2012 0850   LDLCALC 102 (H) 02/12/2018 0940     Imaging I have reviewed images in epic and the results pertinent to this consultation are: CT head with no acute changes. MRI of the brain: Reveals a small traumatic looking subarachnoid in right anterior frontal area.  Also reveals a stable subdural hygroma over the left hemisphere. Not reported in the radiology report as sulcal hyperintensity likely also traumatic subarachnoid in the left occipital area.    Assessment: 69 year old man with above history presenting for evaluation of fall and seizure-like activity.  Has a history of seizure, has been started on Keppra but has had increased falls and increasing intermittent right-sided weakness over the past week.  Does have a left hemispheric subdural but that has been stable in size.  Has traumatic right frontal and left temporal/occipital subarachnoid's which might be potentially the source of breakthrough seizures-and the left temporal focus might explain intermittent right-sided postictal/Todd's paralysis.  Impression: Evaluate for underlying ongoing intermittent seizure activity and Todd's paralysis  Recommendations: Spot EEG followed by LTM EEG  Keppra 500 twice daily Seizure precautions Continue to hold aspirin. We will continue to follow  -- Amie Portland, MD [facility] Pager: [phone] If 7pm to 7am, please call on call as listed on AMION.

## 2019-07-23 DIAGNOSIS — N1831 Chronic kidney disease, stage 3a: Secondary | ICD-10-CM

## 2019-07-23 DIAGNOSIS — R531 Weakness: Secondary | ICD-10-CM

## 2019-07-23 DIAGNOSIS — R569 Unspecified convulsions: Secondary | ICD-10-CM

## 2019-07-23 DIAGNOSIS — F1029 Alcohol dependence with unspecified alcohol-induced disorder: Secondary | ICD-10-CM

## 2019-07-23 LAB — COMPREHENSIVE METABOLIC PANEL
ALT: 28 U/L (ref 0–44)
AST: 59 U/L — ABNORMAL HIGH (ref 15–41)
Albumin: 2.7 g/dL — ABNORMAL LOW (ref 3.5–5.0)
Alkaline Phosphatase: 68 U/L (ref 38–126)
Anion gap: 7 (ref 5–15)
BUN: 11 mg/dL (ref 8–23)
CO2: 26 mmol/L (ref 22–32)
Calcium: 8.7 mg/dL — ABNORMAL LOW (ref 8.9–10.3)
Chloride: 99 mmol/L (ref 98–111)
Creatinine, Ser: 1.27 mg/dL — ABNORMAL HIGH (ref 0.61–1.24)
GFR calc Af Amer: >60 mL/min (ref >60)
GFR calc non Af Amer: 58 mL/min — ABNORMAL LOW (ref >60)
Glucose, Bld: 102 mg/dL — ABNORMAL HIGH (ref 70–99)
Potassium: 3.8 mmol/L (ref 3.5–5.1)
Sodium: 132 mmol/L — ABNORMAL LOW (ref 135–145)
Total Bilirubin: 1 mg/dL (ref 0.3–1.2)
Total Protein: 6.5 g/dL (ref 6.5–8.1)

## 2019-07-23 MED ORDER — OXYCODONE HCL 5 MG PO TABS
5.0000 mg | ORAL_TABLET | ORAL | Status: DC | PRN
  Administered 2019-07-23 – 2019-07-24 (×4): 5 mg via ORAL
  Filled 2019-07-23 (×4): qty 1

## 2019-07-23 MED ORDER — MORPHINE SULFATE (PF) 2 MG/ML IV SOLN
1.0000 mg | INTRAVENOUS | Status: DC | PRN
  Administered 2019-07-23 (×2): 1 mg via INTRAVENOUS
  Filled 2019-07-23 (×2): qty 1

## 2019-07-23 NOTE — Procedures (Addendum)
Patient Name: [name]  MRN: [id number]  Epilepsy Attending: Lora Havens  Referring Physician/Provider: Dr Amie Portland Duration: 07/22/2019 1508 to 07/23/2019 1356  Patient history: 69 year old man with history of right anterior frontal region [facility] and left subdural hygroma presenting for evaluation of fall and seizure-like activity. Has a history of seizure, has been started on Keppra but has had increased falls and increasing intermittent right-sided weakness over the past week. EEG to evaluate for seizures  Level of alertness: Awake,asleep  AEDs during EEG study: LEV  Technical aspects: This EEG study was done with scalp electrodes positioned according to the 10-20 International system of electrode placement. Electrical activity was acquired at a sampling rate of 500Hz  and reviewed with a high frequency filter of 70Hz  and a low frequency filter of 1Hz . EEG data were recorded continuously and digitally stored.   Description: The posterior dominant rhythm consists of 8 Hz activity of moderate voltage (25-35 uV) seen predominantly in posterior head regions, symmetric and reactive to eye opening and eye closing. Sleep was characterized by vertex waves, sleep spindles (12 to 14 Hz), maximal frontocentral region. Hyperventilation and photic stimulation were not performed.     IMPRESSION: This study is within normal limits. No seizures or epileptiform discharges were seen throughout the recording.  [name] [name]

## 2019-07-23 NOTE — TOC Progression Note (Signed)
Transition of Care [facility]) - Progression Note    Patient Details  Name: [name] MRN: [id number] Date of Birth: Mar 11, 1950  [facility] CM/SW Contact  Pollie Friar, RN Phone Number: 07/23/2019, 1:46 PM  Clinical Narrative:    CM has provided choice to the patients daughter for SNF and she selected [facility]. Florentina Jenny with [facility] will have a bed for him when he is ready tomorrow. CM has asked MD for new covid test.  TOC following.   Expected Discharge Plan: Home/Self Care Barriers to Discharge: Continued Medical Work up  Expected Discharge Plan and Services Expected Discharge Plan: Home/Self Care   Discharge Planning Services: CM Consult   Living arrangements for the past 2 months: Single Family Home                                       Social Determinants of Health (SDOH) Interventions    Readmission Risk Interventions No flowsheet data found.

## 2019-07-23 NOTE — Progress Notes (Signed)
LTM EEG without any evidence of seizures Hygroma is chronic and traumatic SAH is acute.  Updated recommendations: -Discontinue LTM -Continue current antiepileptics -Can resume aspirin 81 in 2-4 weeks after follow up with outpatient neurology. Will defer follow up imaging to outpatient neurology. Please call with questions  -- Amie Portland, MD [facility] [phone] (pager)

## 2019-07-23 NOTE — Plan of Care (Signed)
  Problem: Education: Goal: Knowledge of General Education information will improve Description: Including pain rating scale, medication(s)/side effects and non-pharmacologic comfort measures Outcome: Progressing   Problem: Nutrition: Goal: Adequate nutrition will be maintained Outcome: Progressing   Problem: Safety: Goal: Ability to remain free from injury will improve Outcome: Progressing   

## 2019-07-23 NOTE — Progress Notes (Signed)
Family Medicine Teaching Service Daily Progress Note Intern Pager: [phone]  Patient name: [name] Medical record number: [id number] Date of birth: 30-Dec-1950 Age: 69 y.o. Gender: male  Primary Care Provider: Shirley, [place], DO Consultants: Neurology, Ortho Code Status: Full  Pt Overview and Major Events to Date:  07/20/19: Admitted to [place]  5/17: Transferred to [facility]  5/18: Transferred to care under FPTS    Assessment and Plan:  [name] is a 69 y.o. male with medical history significant of seizures, alcohol abuse, liver cirrhosis, CHF, HTN, HLD, CKD, HOH, voicebox status post laryngectomy for laryngeal cancer, hypothyroidism, who presented with falls and possible seizure.  Subarachnoid hemorrhage  Left Subdural Hygroma  Right sided weakness  hx of Sezuries  Patient having vEEG. Await additional neuro recommendations. Patient with hx of recurrent falls and seizures.  MRI of the brain showed acute subarachnoid hemorrhage over the right anterior frontal lobe likely due to trauma.  Additionally a subdural hygroma over the left hemisphere was seen.  Waiting further recommendations from neurology.  -Neurology consulted, patient recommendations -Continue Keppra -Monitor for seizure activity -Hold aspirin -   Alcohol abuse  Hx of Liver Cirrhosis  Chronic. EtOH level 179 on admission. Last drink was 5/16. History of liver cirrhosis. Most recent CIWA's are 0.  AST/ALT 59/28.  Platelets were normal on admission.  Likely not in acute decompensated cirrhosis.  - Monitor CIWAs - AM CMP    Olecranon fracture  Lateral epicondyle fracture  Fall Show a significantly displaced olecranon fracture and a slightly displaced fracture of lateral epicondyle that is distal to the right ulna.  While in the ED at [facility] long Ortho was consulted.  Patient placed in Ace wrap and sling immobilizer in ED. Ortho consulted during admission and patient to follow up in 1  week with Dr. Ninfa Linden.     -Consult orthopedic surgery  - Continue Non-weight bearing    AKI  Chronic kidney disease stage IIIa Baseline creatinine around 1.2-1.3. Elevated to 1.6 on admission and 1.27 today. Continues to improving s/p IVF. - Follow up AM BMP  -  Avoid nephrotoxic agents   Essential hypertension He is not on home antihypertensives. Was on Coreg  previously. Normotensive this morning.   - Consider restarting Coreg if HTN   Hypoglycemia, resolved  Glucose on CMP 61 on admission but on most recent labs 102.  - follow up on AM BMP   Hypothyroidism Takes Synthroid - Continue home medication   Recurrent falls -PT/OT   HFpEF  Appears euvolemic on exam.    Voicebox status post laryngectomy for laryngeal cancer Stable. Provide respiratory care as needed.      FEN/GI: soft diet, replete electrolytes as needed  PPx: SCDs   Disposition: pending neurological evaluation   Subjective:  Reports RUE pain. No other complaints.   Objective: Temp:  [98.1 F (36.7 C)-98.7 F (37.1 C)] 98.2 F (36.8 C) (05/19 0822) Pulse Rate:  [90-102] 102 (05/19 0822) Resp:  [19-22] 20 (05/19 0822) BP: (101-160)/(64-95) 101/64 (05/19 0822) SpO2:  [98 %-100 %] 100 % (05/19 [id number])  Physical Exam: GEN: smiling alert male and in no acute distress  CV: regular rate and rhythm, no murmurs appreciated  RESP: no increased work of breathing, clear to ascultation bilaterally  ABD: scaphoid abdomen, non-tender  MSK: RUE cast and sling, LUE in bandage, able to move b/l thin LE, able to move shoulder and fingers of b/l UE SKIN: warm, dry    Laboratory: Recent Labs  Lab 07/20/19 0954 07/21/19 0048  WBC 7.8 7.0  HGB 12.0* 12.0*  HCT 36.4* 36.1*  PLT 285 186   Recent Labs  Lab 07/20/19 0954 07/21/19 0048 07/23/19 0411  NA 133* 135 132*  K 4.1 3.9 3.8  CL 96* 98 99  CO2 22 19* 26  BUN 14 18 11   CREATININE 1.61* 1.47* 1.27*  CALCIUM 9.3 8.7* 8.7*  PROT 9.0* 8.4*  6.5  BILITOT 1.1 2.4* 1.0  ALKPHOS 90 92 68  ALT 38 30 28  AST 60* 43* 59*  GLUCOSE 87 61* 102*      Imaging/Diagnostic Tests: EEG adult  Result Date: 07/22/2019 Lora Havens, MD     07/22/2019  4:50 PM Patient Name: [name] MRN: [id number] Epilepsy Attending: Lora Havens Referring Physician/Provider: Dr Charlann Lange Date: 07/22/2019 Duration: 24.30 mins Patient history: 69 year old man with history of right anterior frontal region [facility] and left subdural hygroma presenting for evaluation of fall and seizure-like activity.  Has a history of seizure, has been started on Keppra but has had increased falls and increasing intermittent right-sided weakness over the past week. EEG to evaluate for seizures. Level of alertness: Awake AEDs during EEG study: LEV, Ativan Technical aspects: This EEG study was done with scalp electrodes positioned according to the 10-20 International system of electrode placement. Electrical activity was acquired at a sampling rate of 500Hz  and reviewed with a high frequency filter of 70Hz  and a low frequency filter of 1Hz . EEG data were recorded continuously and digitally stored. Description: The posterior dominant rhythm consists of 8 Hz activity of moderate voltage (25-35 uV) seen predominantly in posterior head regions, symmetric and reactive to eye opening and eye closing. Hyperventilation and photic stimulation were not performed.   IMPRESSION: This study is within normal limits. No seizures or epileptiform discharges were seen throughout the recording. Priyanka [name]   Overnight EEG with video  Result Date: 07/23/2019 Lora Havens, MD     07/23/2019  8:49 AM Patient Name: [name] MRN: [id number] Epilepsy Attending: Lora Havens Referring Physician/Provider: Dr Amie Portland Duration: 07/22/2019 1508 to 07/23/2019 0830 Patient history: 69 year old man with history of right anterior frontal region [facility] and left subdural hygroma presenting  for evaluation of fall and seizure-like activity. Has a history of seizure, has been started on Keppra but has had increased falls and increasing intermittent right-sided weakness over the past week. EEG to evaluate for seizures Level of alertness: Awake,asleep AEDs during EEG study: LEV Technical aspects: This EEG study was done with scalp electrodes positioned according to the 10-20 International system of electrode placement. Electrical activity was acquired at a sampling rate of 500Hz  and reviewed with a high frequency filter of 70Hz  and a low frequency filter of 1Hz . EEG data were recorded continuously and digitally stored. Description: The posterior dominant rhythm consists of 8 Hz activity of moderate voltage (25-35 uV) seen predominantly in posterior head regions, symmetric and reactive to eye opening and eye closing. Sleep was characterized by vertex waves, sleep spindles (12 to 14 Hz), maximal frontocentral region. Hyperventilation and photic stimulation were not performed.   IMPRESSION: This study is within normal limits. No seizures or epileptiform discharges were seen throughout the recording. Severiano Gilbert, DO 07/23/2019, 11:00 AM PGY-1, [place] Intern pager: [phone], text pages welcome

## 2019-07-23 NOTE — Progress Notes (Signed)
LTM discontinued; no skin breakdown was seen. 
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
## 2019-07-23 NOTE — Progress Notes (Signed)
Neurology Progress Note   S:// Patient seen and examined. He is of Dr. Delsa Bern EEG He reports a subjective weakness that he might of followed overnight last night but no pushbutton was pressed.   O:// Current vital signs: BP (!) 160/95 (BP Location: Left Arm)   Pulse 98   Temp 98.4 F (36.9 C) (Oral)   Resp (!) 22   Ht 5\' 11"  (1.803 m)   Wt 49.9 kg   SpO2 100%   BMI 15.34 kg/m  Vital signs in last 24 hours: Temp:  [98.1 F (36.7 C)-98.7 F (37.1 C)] 98.4 F (36.9 C) (05/19 0430) Pulse Rate:  [90-101] 98 (05/19 0430) Resp:  [19-22] 22 (05/19 0430) BP: (138-166)/(80-97) 160/95 (05/19 0430) SpO2:  [98 %-100 %] 100 % (05/19 0430) On my examination today he is awake alert in no distress comfortably eating breakfast in bed HEENT: Normocephalic atraumatic with a stoma in place Lungs clear to auscultation Cardiovascular: Regular rate rhythm Abdomen nondistended nontender Extremities warm well perfused Neurological exam Awake alert oriented x3 Speech is through the stoma and not dysarthric He is not aphasic Cranial: Pupils equal round react light, extraocular movements intact, visual fields full, I did not appreciate any facial droop, tongue and palate midline. Motor exam: He is antigravity in all 4 extremities with the exception of the right upper extremity that is immobilized due to the fracture. Sensory exam: Intact to light touch all over without extinction   Medications  Current Facility-Administered Medications:  .  folic acid (FOLVITE) tablet 1 mg, 1 mg, Oral, Daily, Li, Na, MD, 1 mg at 07/22/19 1013 .  hydrALAZINE (APRESOLINE) injection 10 mg, 10 mg, Intravenous, Q6H PRN, Merlene Laughter F, NP, 10 mg at 07/21/19 2121 .  levETIRAcetam (KEPPRA) tablet 500 mg, 500 mg, Oral, BID, Li, Na, MD, 500 mg at 07/22/19 2239 .  levothyroxine (SYNTHROID) tablet 137 mcg, 137 mcg, Oral, QAC breakfast, Nicoletta Dress, Na, MD, 137 mcg at 07/23/19 0601 .  LORazepam (ATIVAN) tablet 1-4 mg, 1-4 mg, Oral,  Q1H PRN **OR** LORazepam (ATIVAN) injection 1-4 mg, 1-4 mg, Intravenous, Q1H PRN, Nicoletta Dress, Na, MD .  [EXPIRED] LORazepam (ATIVAN) tablet 0-4 mg, 0-4 mg, Oral, Q6H **FOLLOWED BY** LORazepam (ATIVAN) tablet 0-4 mg, 0-4 mg, Oral, Q12H, Li, Na, MD .  morphine 2 MG/ML injection 1 mg, 1 mg, Intravenous, Q3H PRN, Gladys Damme, MD, 1 mg at 07/23/19 0037 .  multivitamin with minerals tablet 1 tablet, 1 tablet, Oral, Daily, Nicoletta Dress, Na, MD, 1 tablet at 07/22/19 1013 .  ondansetron (ZOFRAN) tablet 4 mg, 4 mg, Oral, Q6H PRN **OR** ondansetron (ZOFRAN) injection 4 mg, 4 mg, Intravenous, Q6H PRN, Li, Na, MD .  thiamine tablet 100 mg, 100 mg, Oral, Daily, 100 mg at 07/22/19 1013 **OR** thiamine (B-1) injection 100 mg, 100 mg, Intravenous, Daily, Li, Na, MD Labs CBC    Component Value Date/Time   WBC 7.0 07/21/2019 0048   RBC 3.95 (L) 07/21/2019 0048   HGB 12.0 (L) 07/21/2019 0048   HGB 11.0 (L) 07/08/2019 1639   HGB 12.4 (L) 11/04/2009 1559   HCT 36.1 (L) 07/21/2019 0048   HCT 32.2 (L) 07/08/2019 1639   HCT 38.0 (L) 11/04/2009 1559   PLT 186 07/21/2019 0048   PLT 220 07/08/2019 1639   MCV 91.4 07/21/2019 0048   MCV 89 07/08/2019 1639   MCV 91.2 11/04/2009 1559   MCH 30.4 07/21/2019 0048   MCHC 33.2 07/21/2019 0048   RDW 14.7 07/21/2019 0048   RDW 14.7 07/08/2019  1639   RDW 17.2 (H) 11/04/2009 1559   LYMPHSABS 1.8 07/20/2019 0954   LYMPHSABS 2.1 11/04/2009 1559   MONOABS 0.8 07/20/2019 0954   MONOABS 0.4 11/04/2009 1559   EOSABS 0.2 07/20/2019 0954   EOSABS 0.1 11/04/2009 1559   BASOSABS 0.0 07/20/2019 0954   BASOSABS 0.0 11/04/2009 1559    CMP     Component Value Date/Time   NA 132 (L) 07/23/2019 0411   NA 138 07/08/2019 1639   K 3.8 07/23/2019 0411   CL 99 07/23/2019 0411   CL 103 08/21/2014 0000   CO2 26 07/23/2019 0411   GLUCOSE 102 (H) 07/23/2019 0411   BUN 11 07/23/2019 0411   BUN 15 07/08/2019 1639   CREATININE 1.27 (H) 07/23/2019 0411   CREATININE 0.65 07/28/2014 1144   CALCIUM  8.7 (L) 07/23/2019 0411   CALCIUM 8.9 08/21/2014 0000   PROT 6.5 07/23/2019 0411   PROT 7.7 07/08/2019 1639   PROT 7.3 08/21/2014 0000   ALBUMIN 2.7 (L) 07/23/2019 0411   ALBUMIN 3.7 (L) 07/08/2019 1639   ALBUMIN 3.3 08/21/2014 0000   AST 59 (H) 07/23/2019 0411   ALT 28 07/23/2019 0411   ALKPHOS 68 07/23/2019 0411   BILITOT 1.0 07/23/2019 0411   BILITOT 1.1 07/08/2019 1639   GFRNONAA 58 (L) 07/23/2019 0411   GFRAA >60 07/23/2019 0411   Imaging I have reviewed images in epic and the results pertinent to this consultation are: No new imaging from yesterday  Assessment:  69 year old man presenting for evaluation of fall and seizure-like activity.  Has a history of seizures and has been on Keppra but has had increased falls and increasing intermittent right-sided weakness over the past week prior to presentation. He has a left hemispheric subdural hematoma that stable in size.  He also has a traumatic right frontal and left temporal/occipital subarachnoid bleed which might be potentially the source of breakthrough seizures and intermittent postictal weakness/Todd's paralysis. He is hooked up to LTM EEG. Subjectively reports having felt some weakness in the right side but there was no pushbutton pressed.  Awaiting formal EEG reading from the overnight LTM.  Impression: Evaluate for possible underlying intermittent seizure activity/Todd's paralysis Chronic subdural hematoma over the left cerebral hemisphere Acute traumatic subarachnoid bleed  Recommendations: Continue to hold aspirin Continue Keppra 500 twice daily Await formal LTM read-we will update recommendations if necessary after that We will follow.  -- Amie Portland, MD [facility] Pager: [phone] If 7pm to 7am, please call on call as listed on AMION.

## 2019-07-24 DIAGNOSIS — G4089 Other seizures: Secondary | ICD-10-CM | POA: Diagnosis not present

## 2019-07-24 DIAGNOSIS — K7030 Alcoholic cirrhosis of liver without ascites: Secondary | ICD-10-CM | POA: Diagnosis not present

## 2019-07-24 DIAGNOSIS — E039 Hypothyroidism, unspecified: Secondary | ICD-10-CM | POA: Diagnosis not present

## 2019-07-24 DIAGNOSIS — R296 Repeated falls: Secondary | ICD-10-CM | POA: Diagnosis not present

## 2019-07-24 DIAGNOSIS — I609 Nontraumatic subarachnoid hemorrhage, unspecified: Secondary | ICD-10-CM | POA: Diagnosis not present

## 2019-07-24 DIAGNOSIS — C323 Malignant neoplasm of laryngeal cartilage: Secondary | ICD-10-CM | POA: Diagnosis not present

## 2019-07-24 DIAGNOSIS — I5023 Acute on chronic systolic (congestive) heart failure: Secondary | ICD-10-CM | POA: Diagnosis not present

## 2019-07-24 DIAGNOSIS — S52601D Unspecified fracture of lower end of right ulna, subsequent encounter for closed fracture with routine healing: Secondary | ICD-10-CM | POA: Diagnosis not present

## 2019-07-24 DIAGNOSIS — M6281 Muscle weakness (generalized): Secondary | ICD-10-CM | POA: Diagnosis not present

## 2019-07-24 DIAGNOSIS — I10 Essential (primary) hypertension: Secondary | ICD-10-CM | POA: Diagnosis not present

## 2019-07-24 DIAGNOSIS — M2550 Pain in unspecified joint: Secondary | ICD-10-CM | POA: Diagnosis not present

## 2019-07-24 DIAGNOSIS — S42401A Unspecified fracture of lower end of right humerus, initial encounter for closed fracture: Secondary | ICD-10-CM | POA: Diagnosis not present

## 2019-07-24 DIAGNOSIS — R5381 Other malaise: Secondary | ICD-10-CM | POA: Diagnosis not present

## 2019-07-24 DIAGNOSIS — R2689 Other abnormalities of gait and mobility: Secondary | ICD-10-CM | POA: Diagnosis not present

## 2019-07-24 DIAGNOSIS — Z7401 Bed confinement status: Secondary | ICD-10-CM | POA: Diagnosis not present

## 2019-07-24 DIAGNOSIS — R531 Weakness: Secondary | ICD-10-CM | POA: Diagnosis not present

## 2019-07-24 DIAGNOSIS — S52021D Displaced fracture of olecranon process without intraarticular extension of right ulna, subsequent encounter for closed fracture with routine healing: Secondary | ICD-10-CM | POA: Diagnosis not present

## 2019-07-24 LAB — GLUCOSE, CAPILLARY: Glucose-Capillary: 99 mg/dL (ref 70–99)

## 2019-07-24 LAB — SARS CORONAVIRUS 2 (TAT 6-24 HRS): SARS Coronavirus 2: NEGATIVE

## 2019-07-24 MED ORDER — OXYCODONE HCL 5 MG PO TABS: 5.0000 mg | ORAL_TABLET | ORAL | 0 refills | Status: DC | PRN

## 2019-07-24 NOTE — Progress Notes (Signed)
Physical Therapy Treatment Patient Details Name: [name] MRN: [id number] DOB: Apr 23, 1950 Today's Date: 07/24/2019    History of Present Illness 69 year old male with history of seizure disorder, alcohol use, liver cirrhosis, CHF, HTN, HLD, history of laryngectomy for laryngeal cancer, hypothyroidism who came into the hospital with falls and possible seizure.  Unfortunately he has a history of alcohol abuse and continues to drink alcohol. Right elbow x-ray showed S"ignificantly displaced fracture of the olecranon, slightly displaced fracture within the lateral epicondyle of the distal RIGHT ulna. Small SAH -likely traumatic seen on MRI.    PT Comments    Patient received in bed, agrees to PT session. Non verbal due to open trach, mouths responses to questions. Patient performed bed mobility with supervision and use of bed rail. Able to remain NWB on right UE. He is able to transfer with min guard. Ambulated 175 feet with single hand held assist. No overt loss of balance, but benefits from assist. Patient will continue to benefit from skilled PT while here to improve safety and functional independence with mobility for return to PFL.         Follow Up Recommendations  SNF;Supervision/Assistance - 24 hour     Equipment Recommendations  Cane    Recommendations for Other Services       Precautions / Restrictions Precautions Precautions: Fall Required Braces or Orthoses: Sling Restrictions Weight Bearing Restrictions: Yes RUE Weight Bearing: Non weight bearing    Mobility  Bed Mobility Overal bed mobility: Modified Independent Bed Mobility: Supine to Sit     Supine to sit: Modified independent (Device/Increase time)     General bed mobility comments: vc's to not use RUE, able to perform bed mobility without use or R UE  Transfers Overall transfer level: Needs assistance Equipment used: None Transfers: Sit to/from Stand Sit to Stand: Min guard             Ambulation/Gait Ambulation/Gait assistance: Min guard Gait Distance (Feet): 175 Feet Assistive device: 1 person hand held assist Gait Pattern/deviations: Step-through pattern;Decreased step length - right;Decreased step length - left;Decreased stride length Gait velocity: decreased   General Gait Details: steady with HHA   Stairs             Wheelchair Mobility    Modified Rankin (Stroke Patients Only)       Balance Overall balance assessment: Needs assistance Sitting-balance support: Feet supported Sitting balance-[name]: Good     Standing balance support: Single extremity supported;During functional activity Standing balance-[name]: Fair Standing balance comment: UE support needed for safety                            Cognition Arousal/Alertness: Awake/alert Behavior During Therapy: WFL for tasks assessed/performed Overall Cognitive Status: Within Functional Limits for tasks assessed                                 General Comments: Pt pleasant, cooperative throughout session      Exercises Other Exercises Other Exercises: seated LAQ, marching, ap, hip abd/add x 10 reps each    General Comments        Pertinent Vitals/Pain Pain Assessment: Faces Faces Pain Scale: Hurts little more Pain Location: R elbow Pain Descriptors / Indicators: Discomfort;Grimacing Pain Intervention(s): Monitored during session    Home Living  Prior Function            PT Goals (current goals can now be found in the care plan section) Acute Rehab PT Goals Patient Stated Goal: get independent again PT Goal Formulation: With patient Time For Goal Achievement: 08/05/19 Potential to Achieve Goals: Good Progress towards PT goals: Progressing toward goals    Frequency    Min 3X/week      PT Plan Current plan remains appropriate    Co-evaluation              AM-PAC PT "6 Clicks" Mobility    Outcome Measure  Help needed turning from your back to your side while in a flat bed without using bedrails?: A Little Help needed moving from lying on your back to sitting on the side of a flat bed without using bedrails?: A Little Help needed moving to and from a bed to a chair (including a wheelchair)?: A Little Help needed standing up from a chair using your arms (e.g., wheelchair or bedside chair)?: A Little Help needed to walk in hospital room?: A Little Help needed climbing 3-5 steps with a railing? : A Lot 6 Click Score: 17    End of Session Equipment Utilized During Treatment: Gait belt Activity Tolerance: Patient tolerated treatment well Patient left: in chair;with chair alarm set Nurse Communication: Mobility status PT Visit Diagnosis: Unsteadiness on feet (R26.81);History of falling (Z91.81);Repeated falls (R29.6);Pain Pain - Right/Left: Right Pain - part of body: Arm     Time: 1305-1330 PT Time Calculation (min) (ACUTE ONLY): 25 min  Charges:  $Gait Training: 8-22 mins $Therapeutic Exercise: 8-22 mins                     Kristyn Conetta, PT, GCS 07/24/19,2:07 PM

## 2019-07-24 NOTE — Progress Notes (Signed)
Mews score inaccurate related to lead wires misplaced and monitor picking up artifact. Pt was transported to [facility] without distress. See last documented VS, taken on dinamap.IV access removed piror to discharge without difficulty

## 2019-07-24 NOTE — Discharge Summary (Signed)
[facility] Discharge Summary  Patient name: [name] Medical record number: [id number] Date of birth: 08-23-1950 Age: 69 y.o. Gender: male Date of Admission: 07/20/2019  Date of Discharge: 07/24/19  Admitting Physician: Charlann Lange, MD  Primary Care Provider: Shirley, [place], DO Consultants: Neurology   Indication for Hospitalization:  Recurrent falls  Concern for seizure activity   Discharge Diagnoses/Problem List:  Subarachnoid hemorrhage Left subdural hygroma History of seizures Olecranon fracture Lateral epicondyle fracture Recurrent falls Acute on chronic kidney injury stage IIIa Hypothyroidism Heart failure with preserved ejection fraction Voicebox status post laryngectomy History of laryngeal cancer Alcohol use disorder Cirrhosis    Disposition: SNF  Discharge Condition: Stable, Improved   Discharge Exam:   GEN: pleasant elderly male, in no acute distress  HENT: s/p tracheostomy for laryngeal cancer  CV: regular rate and rhythm, no murmurs appreciated  RESP: no increased work of breathing, clear to ascultation bilaterally ABD: soft, non-tender, non-distended  MSK: thin extremities, RUE splint and sling, LUE bandaged  SKIN: warm, dry NEURO: alert, no ataxia, gross sensation intact     Brief Hospital Course:   [name] is a 69 y.o. male with medical history significant ofseizures, alcohol abuse,liver cirrhosis, CHF, HTN, HLD, CKD,HOH,voicebox status post laryngectomy for laryngeal cancer, hypothyroidism, who presented with fallsand possible seizure.  Recurrent Seizures  Subarachnoid hemorrhage  Subdural Hygroma  Patient with progressive right-sided weakness and increasing number of falls in the last few weeks. He was transferred from [facility] for neurology evaluation. Neurology followed patient throughout his admission.  MRI of the brain showed acute subarachnoid hemorrhage over the right anterior  frontal lobe deemed secondary to trauma. Patient has chronic stable subdural hygroma over the left hemisphere. Long-term video EEG performed and was unremarkable. Patient to follow up with neurology in 2-4 weeks. He is to resume ASA after neurology follow up.   Olecranon fracture  Lateral epicondyle fracture  Recurrent falls   Imaging in the ED notable for significantly displaced olecranon fracture and a slightly displaced fracture of lateral epicondyle that is distal to the right ulna. Patient was placed in sling and splint in the ED at [facility]. Orthopedic surgery was consulted and recommended non-operative treatment given his other medical issues at this time.  Patient to follow-up with Dr. Ninfa Linden, orthopedic surgery, for follow-up x-rays next week. Patient pain was controlled with oxycodone as needed.    Home medications were continued for other chronic diseases which remained stable.    Issues for Follow Up:   1.  Resume aspirin 81 in 2-4 weeks after follow up with outpatient neurology. Will defer follow up imaging to outpatient neurology. 2.  Follow-up with Dr. Ninfa Linden, orthopedic surgery, next week.   Significant Procedures:   video EEG (see results below)     Significant Labs and Imaging:   Recent Labs  Lab 07/20/19 0954 07/21/19 0048  WBC 7.8 7.0  HGB 12.0* 12.0*  HCT 36.4* 36.1*  PLT 285 186   Recent Labs  Lab 07/20/19 0954 07/20/19 0954 07/21/19 0048 07/23/19 0411  NA 133*  --  135 132*  K 4.1   < > 3.9 3.8  CL 96*  --  98 99  CO2 22  --  19* 26  GLUCOSE 87  --  61* 102*  BUN 14  --  18 11  CREATININE 1.61*  --  1.47* 1.27*  CALCIUM 9.3  --  8.7* 8.7*  MG  --   --  2.1  --  PHOS  --   --  4.3  --   ALKPHOS 90  --  92 68  AST 60*  --  43* 59*  ALT 38  --  30 28  ALBUMIN 4.0  --  3.9 2.7*   < > = values in this interval not displayed.    MR BRAIN WO CONTRAST  Result Date: 07/20/2019 CLINICAL DATA:  Right-sided weakness. History of seizures.  EXAM: MRI HEAD WITHOUT CONTRAST TECHNIQUE: Multiplanar, multiecho pulse sequences of the brain and surrounding structures were obtained without intravenous contrast. COMPARISON:  05/06/2014 FINDINGS: BRAIN: There is acute subarachnoid blood over the anterior right frontal lobe. Multifocal white matter hyperintensity, most commonly due to chronic ischemic microangiopathy. Advanced atrophy for age. No chronic microhemorrhage. Normal midline structures. Subdural hygroma over the left hemisphere. 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 VASCULAR: Major flow voids are preserved. SKULL AND UPPER CERVICAL SPINE: Normal calvarium and skull base. Visualized upper cervical spine and soft tissues are normal. SINUSES/ORBITS: No paranasal sinus fluid levels or advanced mucosal thickening. No mastoid or middle ear effusion. Normal orbits. IMPRESSION: 1. Acute subarachnoid blood over the anterior right frontal lobe. Pattern is compatible with trauma. 2. Advanced atrophy and findings of chronic small vessel disease. 3. Subdural hygroma over the left hemisphere. Critical Value/emergent results were called by telephone at the time of interpretation on 07/20/2019 at 9:00 pm to provider Jeannene Patella, who verbally acknowledged these results. Electronically Signed   By: Ulyses Jarred M.D.   On: 07/20/2019 21:01   EEG adult  Result Date: 07/22/2019 Lora Havens, MD     07/22/2019  4:50 PM Patient Name: [name] MRN: [id number] Epilepsy Attending: Lora Havens Referring Physician/Provider: Dr Charlann Lange Date: 07/22/2019 Duration: 24.30 mins Patient history: 69 year old man with history of right anterior frontal region [facility] and left subdural hygroma presenting for evaluation of fall and seizure-like activity.  Has a history of seizure, has been started on Keppra but has had increased falls and increasing intermittent right-sided weakness over the past week. EEG to evaluate for seizures. Level of alertness: Awake AEDs  during EEG study: LEV, Ativan Technical aspects: This EEG study was done with scalp electrodes positioned according to the 10-20 International system of electrode placement. Electrical activity was acquired at a sampling rate of 500Hz  and reviewed with a high frequency filter of 70Hz  and a low frequency filter of 1Hz . EEG data were recorded continuously and digitally stored. Description: The posterior dominant rhythm consists of 8 Hz activity of moderate voltage (25-35 uV) seen predominantly in posterior head regions, symmetric and reactive to eye opening and eye closing. Hyperventilation and photic stimulation were not performed.   IMPRESSION: This study is within normal limits. No seizures or epileptiform discharges were seen throughout the recording. Priyanka [name]   Overnight EEG with video  Result Date: 07/23/2019 Lora Havens, MD     07/23/2019  5:59 PM Patient Name: [name] MRN: [id number] Epilepsy Attending: Lora Havens Referring Physician/Provider: Dr Amie [place] Duration: 07/22/2019 1508 to 07/23/2019 1356 Patient history: 69 year old man with history of right anterior frontal region [facility] and left subdural hygroma presenting for evaluation of fall and seizure-like activity. Has a history of seizure, has been started on Keppra but has had increased falls and increasing intermittent right-sided weakness over the past week. EEG to evaluate for seizures Level of alertness: Awake,asleep AEDs during EEG study: LEV Technical aspects: This EEG study was done with scalp electrodes positioned according to  the 10-20 International system of electrode placement. Electrical activity was acquired at a sampling rate of 500Hz  and reviewed with a high frequency filter of 70Hz  and a low frequency filter of 1Hz . EEG data were recorded continuously and digitally stored. Description: The posterior dominant rhythm consists of 8 Hz activity of moderate voltage (25-35 uV) seen predominantly in  posterior head regions, symmetric and reactive to eye opening and eye closing. Sleep was characterized by vertex waves, sleep spindles (12 to 14 Hz), maximal frontocentral region. Hyperventilation and photic stimulation were not performed.   IMPRESSION: This study is within normal limits. No seizures or epileptiform discharges were seen throughout the recording. Priyanka [name]     Results/Tests Pending at Time of Discharge: None  Discharge Medications:  Allergies as of 07/24/2019      Reactions   Succinylcholine Other (See Comments)   ADVERSE REACTION (not an allergy) Following 1st attempt to place tracheotomy tube in 2011 at [facility]; patient went into "succinylcholine induced pulseless electrical activity secondary to probable hyperkalemia" and underwent chest compressions and placement of endotracheal tube with admission to medical ICU.  EXTREMELY HIGH SEVERE REACTION.    Tylenol [acetaminophen] Other (See Comments), Hypertension   HBP -Liver Cirrhosis   Vicodin [hydrocodone-acetaminophen] Rash   Other    Unknown anesthesia medicine caused cardiac arrest.   Protonix [pantoprazole]       Medication List    STOP taking these medications   aspirin EC 81 MG tablet     TAKE these medications   levETIRAcetam 100 MG/ML solution Commonly known as: Keppra Take 5 mLs (500 mg total) by mouth 2 (two) times daily.   levothyroxine 137 MCG tablet Commonly known as: Synthroid Take 1 tablet (137 mcg total) by mouth daily before breakfast.   oxyCODONE 5 MG immediate release tablet Commonly known as: Oxy IR/ROXICODONE Take 1 tablet (5 mg total) by mouth every 4 (four) hours as needed for moderate pain.       Discharge Instructions: Please refer to Patient Instructions section of EMR for full details.  Patient was counseled important signs and symptoms that should prompt return to medical care, changes in medications, dietary instructions, activity restrictions, and follow up  appointments.   Follow-Up Appointments:  Contact information for follow-up providers    Mcarthur Rossetti, MD. Schedule an appointment as soon as possible for a visit in 1 week.   Specialty: Orthopedic Surgery Contact information: [place] [address] [phone]            Contact information for after-discharge care    Destination    [facility] .   Service: Skilled Nursing Contact information: [address] [place], [place], DO 07/24/2019, 1:10 PM PGY-1, [place]

## 2019-07-24 NOTE — Care Management Important Message (Signed)
Important Message  Patient Details  Name: [name] MRN: [id number] Date of Birth: Jun 08, 1950   Medicare Important Message Given:  Yes Patient left prior to IM delivery.  IM will be mailed to Patient home address.     Darron Stuck 07/24/2019, 3:49 PM

## 2019-07-24 NOTE — Progress Notes (Signed)
Telephone report called to [facility] and given to [employer]

## 2019-07-24 NOTE — Progress Notes (Signed)
Occupational Therapy Treatment Patient Details Name: [name] MRN: [id number] DOB: December 08, 1950 Today's Date: 07/24/2019    History of present illness 69 year old male with history of seizure disorder, alcohol use, liver cirrhosis, CHF, HTN, HLD, history of laryngectomy for laryngeal cancer, hypothyroidism who came into the hospital with falls and possible seizure.  Unfortunately he has a history of alcohol abuse and continues to drink alcohol. Right elbow x-ray showed S"ignificantly displaced fracture of the olecranon, slightly displaced fracture within the lateral epicondyle of the distal RIGHT ulna. Small SAH -likely traumatic seen on MRI.   OT comments  Patient continues to make steady progress towards goals in skilled OT session. [name] session encompassed increased education on compensatory strategies for RUE and further progression in ADLs. Pt with receptiveness to compensatory strategies for upper body dressing, however did not simulate in session. Pt with increased ability to complete oral care and toileting in session at min guard level with appropriate adherence to weight bearing precautions with min to no verbal cues. Discharge remains appropriate; will continue to follow acutely.    Follow Up Recommendations  SNF;Supervision/Assistance - 24 hour    Equipment Recommendations  Other (comment)(Defer to next venue)    Recommendations for Other Services      Precautions / Restrictions Precautions Precautions: Fall Required Braces or Orthoses: Sling Restrictions Weight Bearing Restrictions: Yes RUE Weight Bearing: Non weight bearing       Mobility Bed Mobility Overal bed mobility: Modified Independent Bed Mobility: Supine to Sit     Supine to sit: Modified independent (Device/Increase time)     General bed mobility comments: in chair upon arrival  Transfers Overall transfer level: Modified independent Equipment used: None Transfers: Sit to/from  Stand Sit to Stand: Min guard         General transfer comment: Min gaurd solely for safety    Balance Overall balance assessment: Needs assistance Sitting-balance support: Feet supported Sitting balance-[name]: Good     Standing balance support: Single extremity supported;During functional activity Standing balance-[name]: Fair Standing balance comment: UE support needed for safety                           ADL either performed or assessed with clinical judgement   ADL Overall ADL's : Needs assistance/impaired     Grooming: Standing;Minimal assistance;Min guard;Oral care;Wash/dry hands Grooming Details (indicate cue type and reason): Able to place toothpaste in R fingers without breaking precautions to complete to manipulate top                 Toilet Transfer: Minimal assistance;Min guard Toilet Transfer Details (indicate cue type and reason): min A to complete toileting in standing for safety Toileting- Clothing Manipulation and Hygiene: Modified independent Toileting - Clothing Manipulation Details (indicate cue type and reason): increased time due to hospital gown and RUE NWB     Functional mobility during ADLs: Min guard;Minimal assistance General ADL Comments: Increased ability to complete ADLs, understanding compensatory strategies for dressing but was not able to simulate     Vision       Perception     Praxis      Cognition Arousal/Alertness: Awake/alert Behavior During Therapy: WFL for tasks assessed/performed Overall Cognitive Status: Within Functional Limits for tasks assessed                                 General Comments:  Pt pleasant, cooperative throughout session        Exercises Other Exercises Other Exercises: seated LAQ, marching, ap, hip abd/add x 10 reps each   Shoulder Instructions       General Comments      Pertinent Vitals/ Pain       Pain Assessment: Faces Faces Pain Scale: Hurts a  little bit Pain Location: R elbow Pain Descriptors / Indicators: Discomfort;Grimacing Pain Intervention(s): Limited activity within patient's tolerance;Monitored during session;Repositioned  Home Living                                          Prior Functioning/Environment              Frequency  Min 2X/week        Progress Toward Goals  OT Goals(current goals can now be found in the care plan section)  Progress towards OT goals: Progressing toward goals  Acute Rehab OT Goals Patient Stated Goal: get independent again OT Goal Formulation: With patient Time For Goal Achievement: 08/05/19 Potential to Achieve Goals: Good  Plan Discharge plan remains appropriate    Co-evaluation                 AM-PAC OT "6 Clicks" Daily Activity     Outcome Measure   Help from another person eating meals?: A Little Help from another person taking care of personal grooming?: A Little Help from another person toileting, which includes using toliet, bedpan, or urinal?: A Little Help from another person bathing (including washing, rinsing, drying)?: A Little Help from another person to put on and taking off regular upper body clothing?: A Lot Help from another person to put on and taking off regular lower body clothing?: A Lot 6 Click Score: 16    End of Session    OT Visit Diagnosis: Unsteadiness on feet (R26.81);Other abnormalities of gait and mobility (R26.89);Repeated falls (R29.6);History of falling (Z91.81);Muscle weakness (generalized) (M62.81);Adult, failure to thrive (R62.7);Pain Pain - Right/Left: Right Pain - part of body: Arm   Activity Tolerance Patient tolerated treatment well   Patient Left in chair;with call bell/phone within reach;with chair alarm set   Nurse Communication Mobility status;Precautions;Weight bearing status        Time: 1331-1346 OT Time Calculation (min): 15 min  Charges: OT General Charges $OT Visit: 1 Visit OT  Treatments $Self Care/Home Management : 8-22 mins  Corinne Ports E. Louisa Favaro, COTA/L Acute Rehabilitation Services [place] 07/24/2019, 2:08 PM

## 2019-07-24 NOTE — TOC Transition Note (Signed)
Transition of Care [facility]) - CM/SW Discharge Note   Patient Details  Name: [name] MRN: [id number] Date of Birth: 1951-02-07  Transition of Care [facility]) CM/SW Contact:  Pollie Friar, RN Phone Number: 07/24/2019, 1:31 PM   Clinical Narrative:    Pt discharging to [facility] today. Daughter aware and in agreement. Pt to transport via [facility]. Bedside RN updated and d/c packet at the desk.   Room: 120 Number for report: [phone]   Final next level of care: Skilled Nursing Facility Barriers to Discharge: No Barriers Identified   Patient Goals and CMS Choice Patient states their goals for this hospitalization and ongoing recovery are:: go home CMS Medicare.gov Compare Post Acute Care list provided to:: Patient Represenative (must comment) Choice offered to / list presented to : Adult Children  Discharge Placement              Patient chooses bed at: [facility]) Patient to be transferred to facility by: [place] Name of family member notified: [name]--daughter Patient and family notified of of transfer: 07/24/19  Discharge Plan and Services   Discharge Planning Services: CM Consult                                 Social Determinants of Health (SDOH) Interventions     Readmission Risk Interventions No flowsheet data found.

## 2019-07-24 NOTE — Hospital Course (Signed)
     Follow up Resume aspirin 81 in 2-4 weeks after follow up with outpatient neurology. Will defer follow up imaging to outpatient neurology.

## 2019-07-31 ENCOUNTER — Other Ambulatory Visit: Payer: Self-pay | Admitting: Family Medicine

## 2019-07-31 DIAGNOSIS — R296 Repeated falls: Secondary | ICD-10-CM | POA: Diagnosis not present

## 2019-07-31 DIAGNOSIS — E039 Hypothyroidism, unspecified: Secondary | ICD-10-CM | POA: Diagnosis not present

## 2019-07-31 DIAGNOSIS — G4089 Other seizures: Secondary | ICD-10-CM | POA: Diagnosis not present

## 2019-07-31 DIAGNOSIS — I609 Nontraumatic subarachnoid hemorrhage, unspecified: Secondary | ICD-10-CM | POA: Diagnosis not present

## 2019-08-06 DIAGNOSIS — I609 Nontraumatic subarachnoid hemorrhage, unspecified: Secondary | ICD-10-CM | POA: Diagnosis not present

## 2019-08-06 DIAGNOSIS — E039 Hypothyroidism, unspecified: Secondary | ICD-10-CM | POA: Diagnosis not present

## 2019-08-06 DIAGNOSIS — G4089 Other seizures: Secondary | ICD-10-CM | POA: Diagnosis not present

## 2019-08-06 DIAGNOSIS — R296 Repeated falls: Secondary | ICD-10-CM | POA: Diagnosis not present

## 2019-08-12 ENCOUNTER — Other Ambulatory Visit: Payer: Medicare Other

## 2019-08-12 ENCOUNTER — Encounter: Payer: Self-pay | Admitting: Neurology

## 2019-08-13 DIAGNOSIS — R296 Repeated falls: Secondary | ICD-10-CM | POA: Diagnosis not present

## 2019-08-13 DIAGNOSIS — I609 Nontraumatic subarachnoid hemorrhage, unspecified: Secondary | ICD-10-CM | POA: Diagnosis not present

## 2019-08-13 DIAGNOSIS — G4089 Other seizures: Secondary | ICD-10-CM | POA: Diagnosis not present

## 2019-08-13 DIAGNOSIS — E039 Hypothyroidism, unspecified: Secondary | ICD-10-CM | POA: Diagnosis not present

## 2019-08-14 ENCOUNTER — Telehealth: Payer: Self-pay

## 2019-08-14 NOTE — Telephone Encounter (Signed)
Left voice mail to have patient's daughter, [name], to return phone to follow up on how the patient is doing as far as his seizures and anemia.

## 2019-08-18 DIAGNOSIS — S52601D Unspecified fracture of lower end of right ulna, subsequent encounter for closed fracture with routine healing: Secondary | ICD-10-CM | POA: Diagnosis not present

## 2019-08-25 ENCOUNTER — Other Ambulatory Visit: Payer: Self-pay

## 2019-08-25 ENCOUNTER — Ambulatory Visit (INDEPENDENT_AMBULATORY_CARE_PROVIDER_SITE_OTHER): Payer: Medicare Other | Admitting: Family Medicine

## 2019-08-25 ENCOUNTER — Encounter: Payer: Self-pay | Admitting: Family Medicine

## 2019-08-25 DIAGNOSIS — S52021A Displaced fracture of olecranon process without intraarticular extension of right ulna, initial encounter for closed fracture: Secondary | ICD-10-CM

## 2019-08-25 DIAGNOSIS — F1029 Alcohol dependence with unspecified alcohol-induced disorder: Secondary | ICD-10-CM

## 2019-08-25 DIAGNOSIS — S42401A Unspecified fracture of lower end of right humerus, initial encounter for closed fracture: Secondary | ICD-10-CM | POA: Diagnosis not present

## 2019-08-25 DIAGNOSIS — S52601A Unspecified fracture of lower end of right ulna, initial encounter for closed fracture: Secondary | ICD-10-CM | POA: Diagnosis not present

## 2019-08-25 DIAGNOSIS — R569 Unspecified convulsions: Secondary | ICD-10-CM

## 2019-08-25 NOTE — Patient Instructions (Signed)
Thank you for coming to see me today. It was a pleasure! Today we talked about:   I'm glad you are doing better. Please keep your orthopedic appointment as scheduled. Keep up the good work with not drinking alcohol!  Please follow-up with Dr. Rise Patience in 6 weeks or sooner as needed.  If you have any questions or concerns, please do not hesitate to call the office at [phone].  Take Care,   [place] Shamiracle Gorden, DO

## 2019-08-25 NOTE — Progress Notes (Signed)
   SUBJECTIVE:   CHIEF COMPLAINT / HPI:   Hospital follow-up: Patient recently discharged from hospital on 07/24/2019 after presenting with olecranon fracture from recurrent falls as well as subarachnoid hemorrhage with concern for continued seizure activity.  Patient states that since leaving the hospital he has felt great.  He states that his last drink was 30 days ago.  He said he did allow himself 1, 12 ounce beer yesterday for Father's Day but does not plan to continue to drink.  He states he is happy with his progress.  He reports that he has been taking his Keppra regularly.  Patient denies any further regular seizure activity and he denies any further falls.  Patient has follow-up with neurology soon.  He has not yet followed up with orthopedics but has an appointment on 08/28/2019.  Patient denies any chest pain, shortness of breath, swelling.  After hospitalization patient was to resume his aspirin 81 which he reports that he has been taking daily since.  He reports that the pain in his arm has been well controlled and he has not needed pain medications.  PERTINENT  PMH / PSH: seizures, alcohol abuse,liver cirrhosis, CHF, HTN, HLD, CKD,HOH,voicebox status post laryngectomy for laryngeal cancer, hypothyroidism  OBJECTIVE:  BP 140/80   Pulse 80   Ht 5\' 11"  (1.803 m)   Wt 118 lb (53.5 kg)   SpO2 96%   BMI 16.46 kg/m   General: NAD, pleasant, uses voicebox for speaking Neck: Supple Respiratory: normal work of breathing Psych: AOx3, appropriate affect  ASSESSMENT/PLAN:   Alcohol dependence ([place]) Since patient's recent hospitalization has been 30 days without alcoholic drink.  He does state he had 1.  12 ounce beer yesterday but does not plan to continue to drink.  He has family supporting him and he will be honest with them as in the past he has started drinking, hidden it and then had withdrawal.  He reports that he is feeling better after discontinuing drinking.  Olecranon  fracture, right, closed, initial encounter Pain well controlled.  Patient to follow-up with orthopedics this week.  Seizure [facility]) Patient reports that he is stable on Keppra 500 mg twice daily and he is to follow-up with neurology.  He has restarted his aspirin 81 mg as recommended while he was hospitalized.    Martin[place] Myelle Poteat, DO PGY-3, [facility]

## 2019-08-26 NOTE — Assessment & Plan Note (Signed)
Patient reports that he is stable on Keppra 500 mg twice daily and he is to follow-up with neurology.  He has restarted his aspirin 81 mg as recommended while he was hospitalized.

## 2019-08-26 NOTE — Assessment & Plan Note (Signed)
Since patient's recent hospitalization has been 30 days without alcoholic drink.  He does state he had 1.  12 ounce beer yesterday but does not plan to continue to drink.  He has family supporting him and he will be honest with them as in the past he has started drinking, hidden it and then had withdrawal.  He reports that he is feeling better after discontinuing drinking.

## 2019-08-26 NOTE — Assessment & Plan Note (Signed)
Pain well controlled.  Patient to follow-up with orthopedics this week.

## 2019-08-28 DIAGNOSIS — S52021A Displaced fracture of olecranon process without intraarticular extension of right ulna, initial encounter for closed fracture: Secondary | ICD-10-CM | POA: Diagnosis not present

## 2019-08-28 DIAGNOSIS — M25521 Pain in right elbow: Secondary | ICD-10-CM | POA: Diagnosis not present

## 2019-08-28 DIAGNOSIS — S42401A Unspecified fracture of lower end of right humerus, initial encounter for closed fracture: Secondary | ICD-10-CM | POA: Diagnosis not present

## 2019-09-11 ENCOUNTER — Ambulatory Visit: Payer: Medicare Other | Admitting: Neurology

## 2019-09-11 ENCOUNTER — Other Ambulatory Visit: Payer: Self-pay

## 2019-09-16 ENCOUNTER — Ambulatory Visit: Payer: Medicare Other | Admitting: Neurology

## 2019-09-17 DIAGNOSIS — S52601D Unspecified fracture of lower end of right ulna, subsequent encounter for closed fracture with routine healing: Secondary | ICD-10-CM | POA: Diagnosis not present

## 2019-09-25 DIAGNOSIS — S42401A Unspecified fracture of lower end of right humerus, initial encounter for closed fracture: Secondary | ICD-10-CM | POA: Diagnosis not present

## 2019-09-25 DIAGNOSIS — S52021A Displaced fracture of olecranon process without intraarticular extension of right ulna, initial encounter for closed fracture: Secondary | ICD-10-CM | POA: Diagnosis not present

## 2019-10-23 ENCOUNTER — Encounter: Payer: Self-pay | Admitting: Neurology

## 2019-10-23 ENCOUNTER — Other Ambulatory Visit: Payer: Self-pay | Admitting: Neurology

## 2019-10-23 ENCOUNTER — Other Ambulatory Visit: Payer: Self-pay

## 2019-10-23 ENCOUNTER — Ambulatory Visit (INDEPENDENT_AMBULATORY_CARE_PROVIDER_SITE_OTHER): Payer: Medicare Other | Admitting: Neurology

## 2019-10-23 VITALS — BP 150/102 | HR 101 | Ht 71.5 in | Wt 115.3 lb

## 2019-10-23 DIAGNOSIS — E271 Primary adrenocortical insufficiency: Secondary | ICD-10-CM

## 2019-10-23 DIAGNOSIS — I609 Nontraumatic subarachnoid hemorrhage, unspecified: Secondary | ICD-10-CM

## 2019-10-23 DIAGNOSIS — E440 Moderate protein-calorie malnutrition: Secondary | ICD-10-CM

## 2019-10-23 DIAGNOSIS — K860 Alcohol-induced chronic pancreatitis: Secondary | ICD-10-CM

## 2019-10-23 DIAGNOSIS — I5022 Chronic systolic (congestive) heart failure: Secondary | ICD-10-CM

## 2019-10-23 DIAGNOSIS — Z430 Encounter for attention to tracheostomy: Secondary | ICD-10-CM

## 2019-10-23 DIAGNOSIS — R569 Unspecified convulsions: Secondary | ICD-10-CM | POA: Diagnosis not present

## 2019-10-23 DIAGNOSIS — F1026 Alcohol dependence with alcohol-induced persisting amnestic disorder: Secondary | ICD-10-CM | POA: Diagnosis not present

## 2019-10-23 DIAGNOSIS — K9423 Gastrostomy malfunction: Secondary | ICD-10-CM

## 2019-10-23 DIAGNOSIS — F10129 Alcohol abuse with intoxication, unspecified: Secondary | ICD-10-CM

## 2019-10-23 DIAGNOSIS — Z8719 Personal history of other diseases of the digestive system: Secondary | ICD-10-CM

## 2019-10-23 MED ORDER — LEVETIRACETAM 100 MG/ML PO SOLN: ORAL | 3 refills | Status: DC

## 2019-10-23 MED ORDER — LEVETIRACETAM 100 MG/ML PO SOLN: 500.0000 mg | Freq: Two times a day (BID) | ORAL | 3 refills | Status: DC

## 2019-10-23 MED ORDER — LEVETIRACETAM 100 MG/ML PO SOLN: 250.0000 mg | Freq: Two times a day (BID) | ORAL | 1 refills | Status: DC
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
## 2019-10-23 NOTE — Patient Instructions (Signed)
Levetiracetam liquid     We will ask you to take 250 mg twice a day until November 2021.  What is this medicine? LEVETIRACETAM (lee ve tye RA se tam) is an antiepileptic drug. It is used with other medicines to treat certain types of seizures. This medicine may be used for other purposes; ask your health care provider or pharmacist if you have questions. COMMON BRAND NAME(S): Keppra, Roweepra What should I tell my health care provider before I take this medicine? They need to know if you have any of these conditions:  kidney disease  suicidal thoughts, plans, or attempt; a previous suicide attempt by you or a family member  an unusual or allergic reaction to levetiracetam, other medicines, foods, dyes, or preservatives  pregnant or trying to get pregnant  breast-feeding How should I use this medicine? Take this medicine by mouth with a glass of water. Follow the directions on the prescription label. Swallow the tablets whole. Do not crush or chew this medicine. You may take this medicine with or without food. Take your doses at regular intervals. Do not take your medicine more often than directed. Do not stop taking this medicine or any of your seizure medicines unless instructed by your doctor or health care professional. Stopping your medicine suddenly can increase your seizures or their severity. A special MedGuide will be given to you by the pharmacist with each prescription and refill. Be sure to read this information carefully each time. Contact your pediatrician or health care professional regarding the use of this medication in children. While this drug may be prescribed for children as young as 35 years of age for selected conditions, precautions do apply. Overdosage: If you think you have taken too much of this medicine contact a poison control center or emergency room at once. NOTE: This medicine is only for you. Do not share this medicine with others. What if I miss a dose? If  you miss a dose, take it as soon as you can. If it is almost time for your next dose, take only that dose. Do not take double or extra doses. What may interact with this medicine? This medicine may interact with the following medications:  carbamazepine  colesevelam  probenecid  sevelamer This list may not describe all possible interactions. Give your health care provider a list of all the medicines, herbs, non-prescription drugs, or dietary supplements you use. Also tell them if you smoke, drink alcohol, or use illegal drugs. Some items may interact with your medicine. What should I watch for while using this medicine? Visit your doctor or health care provider for a regular check on your progress. Wear a medical identification bracelet or chain to say you have epilepsy, and carry a card that lists all your medications. This medicine may cause serious skin reactions. They can happen weeks to months after starting the medicine. Contact your health care provider right away if you notice fevers or flu-like symptoms with a rash. The rash may be red or purple and then turn into blisters or peeling of the skin. Or, you might notice a red rash with swelling of the face, lips or lymph nodes in your neck or under your arms. It is important to take this medicine exactly as instructed by your health care provider. When first starting treatment, your dose may need to be adjusted. It may take weeks or months before your dose is stable. You should contact your doctor or health care provider if your seizures get  worse or if you have any new types of seizures. You may get drowsy or dizzy. Do not drive, use machinery, or do anything that needs mental alertness until you know how this medicine affects you. Do not stand or sit up quickly, especially if you are an older patient. This reduces the risk of dizzy or fainting spells. Alcohol may interfere with the effect of this medicine. Avoid alcoholic drinks. The use of  this medicine may increase the chance of suicidal thoughts or actions. Pay special attention to how you are responding while on this medicine. Any worsening of mood, or thoughts of suicide or dying should be reported to your health care provider right away. Women who become pregnant while using this medicine may enroll in the [place] Pregnancy Registry by calling [phone]. This registry collects information about the safety of antiepileptic drug use during pregnancy. What side effects may I notice from receiving this medicine? Side effects that you should report to your doctor or health care professional as soon as possible:  allergic reactions like skin rash, itching or hives, swelling of the face, lips, or tongue  breathing problems  dark urine  general ill feeling or flu-like symptoms  problems with balance, talking, walking  rash, fever, and swollen lymph nodes  redness, blistering, peeling or loosening of the skin, including inside the mouth  unusually weak or tired  worsening of mood, thoughts or actions of suicide or dying  yellowing of the eyes or skin Side effects that usually do not require medical attention (report to your doctor or health care professional if they continue or are bothersome):  diarrhea  dizzy, drowsy  headache  loss of appetite This list may not describe all possible side effects. Call your doctor for medical advice about side effects. You may report side effects to FDA at 1-800-FDA-1088. Where should I keep my medicine? Keep out of reach of children. Store at room temperature between 15 and 30 degrees C (59 and 86 degrees F). Throw away any unused medicine after the expiration date. NOTE: This sheet is a summary. It may not cover all possible information. If you have questions about this medicine, talk to your doctor, pharmacist, or health care provider.  2020 Elsevier/Gold Standard (2018-05-24 15:23:36)

## 2019-10-30 ENCOUNTER — Ambulatory Visit (INDEPENDENT_AMBULATORY_CARE_PROVIDER_SITE_OTHER): Payer: Medicare Other

## 2019-10-30 DIAGNOSIS — R569 Unspecified convulsions: Secondary | ICD-10-CM

## 2019-10-30 DIAGNOSIS — F1026 Alcohol dependence with alcohol-induced persisting amnestic disorder: Secondary | ICD-10-CM

## 2019-10-30 DIAGNOSIS — I609 Nontraumatic subarachnoid hemorrhage, unspecified: Secondary | ICD-10-CM

## 2019-10-30 DIAGNOSIS — E440 Moderate protein-calorie malnutrition: Secondary | ICD-10-CM

## 2019-11-05 DIAGNOSIS — Z20822 Contact with and (suspected) exposure to covid-19: Secondary | ICD-10-CM | POA: Diagnosis not present

## 2019-11-20 DIAGNOSIS — I609 Nontraumatic subarachnoid hemorrhage, unspecified: Secondary | ICD-10-CM | POA: Insufficient documentation

## 2019-11-20 DIAGNOSIS — E440 Moderate protein-calorie malnutrition: Secondary | ICD-10-CM | POA: Insufficient documentation

## 2019-11-20 NOTE — Procedures (Signed)
This EEG was performed using the international placement system of electrodes, also called the 10-20 system.  The patient was exposed to hyperventilation and photic stimulation during this recording is 27-minute 38 seconds duration on October 30, 2019.  An 8 Hz posterior dominant rhythm is variable amplitude was noted with eye closure, at the same time there is significant bifrontal slowing noted which worsened during hyperventilation.  Photic stimulation shows a strong response only at 3 and 6 Hz, higher frequencies did not elicit any photic entrainment.   Spike and sharp wave arose over the right frontal lobe as the patient became increasingly drowsy and there was sustained slowing  over the left hemisphere, frontal.   The slowest EEG activity was seen over the fourth right frontal electrode.  Over both frontal lobes there is abnormally slow activity noted.   This is an abnormal EEG with evidence of bifrontal slowing intermittent dysrhythmia which can indicate a possible seizure focus.  The patient had in the past been treated with Keppra which would still be an appropriate medication.  Further treatment is sustained sobriety from alcohol.  Asencion Partridge Fabyan Loughmiller

## 2019-11-20 NOTE — Progress Notes (Signed)
This is an abnormal EEG with evidence of bifrontal slowing  intermittent dysrhythmia which can indicate a possible seizure  focus. The patient had in the past been treated with Keppra  which would still be an appropriate medication. Further essential  treatment consists of the sustained sobriety from alcohol.

## 2019-11-25 ENCOUNTER — Telehealth: Payer: Self-pay | Admitting: Neurology

## 2019-11-25 ENCOUNTER — Encounter: Payer: Self-pay | Admitting: Neurology

## 2019-11-25 NOTE — Telephone Encounter (Signed)
-----   Message from Larey Seat, MD sent at 11/20/2019 12:38 PM EDT ----- This is an abnormal EEG with evidence of bifrontal slowing  intermittent dysrhythmia which can indicate a possible seizure  focus. The patient had in the past been treated with Keppra  which would still be an appropriate medication. Further essential  treatment consists of the sustained sobriety from alcohol.

## 2019-11-25 NOTE — Telephone Encounter (Signed)
Called the daughter and there was no answer. LVM advising of the EEG results were completed, if she would like to call back we can review. Advised a my chart message would also be sent if easier to review there.

## 2019-12-12 ENCOUNTER — Telehealth: Payer: Self-pay | Admitting: Family Medicine

## 2019-12-12 NOTE — Telephone Encounter (Signed)
Medical Certification  form dropped off for at front desk for completion.  Verified that patient section of form has been completed.  Last DOS/WCC with PCP was06/21/21.  Placed form in team folder to be completed by clinical staff.  [name]

## 2019-12-12 NOTE — Telephone Encounter (Signed)
Clinical info completed on handicap placard form.  Place form in Dr. Marica Otter box for completion.  Gram Siedlecki, CMA

## 2019-12-22 NOTE — Telephone Encounter (Signed)
Form completed and left in front office.

## 2019-12-24 NOTE — Telephone Encounter (Signed)
[name] daughter called and informed that forms are ready for pick up. Copy made and placed in batch scanning. Original placed at front desk for pick up.   Trenae Brunke C Zoriyah Scheidegger, RN  

## 2020-01-12 ENCOUNTER — Ambulatory Visit: Payer: Medicare Other | Admitting: Neurology

## 2020-01-12 ENCOUNTER — Encounter: Payer: Self-pay | Admitting: Neurology

## 2020-03-17 DIAGNOSIS — Z1152 Encounter for screening for COVID-19: Secondary | ICD-10-CM | POA: Diagnosis not present

## 2020-03-27 ENCOUNTER — Other Ambulatory Visit: Payer: Self-pay | Admitting: Neurology

## 2020-08-24 ENCOUNTER — Other Ambulatory Visit: Payer: Self-pay

## 2020-08-26 NOTE — Telephone Encounter (Signed)
Attempted to reach pt. No answer. LVM for pt to call the office to make follow up appt with Dr. Oleh Genin for med refills and check up . Salvatore Marvel, CMA

## 2020-09-29 ENCOUNTER — Other Ambulatory Visit: Payer: Self-pay | Admitting: Neurology

## 2021-01-05 ENCOUNTER — Other Ambulatory Visit: Payer: Self-pay | Admitting: Neurology

## 2021-01-13 DIAGNOSIS — Z23 Encounter for immunization: Secondary | ICD-10-CM | POA: Diagnosis not present

## 2021-02-01 ENCOUNTER — Other Ambulatory Visit: Payer: Self-pay

## 2021-02-01 ENCOUNTER — Ambulatory Visit (INDEPENDENT_AMBULATORY_CARE_PROVIDER_SITE_OTHER): Payer: Medicare Other

## 2021-02-01 ENCOUNTER — Encounter: Payer: Self-pay | Admitting: Family Medicine

## 2021-02-01 ENCOUNTER — Ambulatory Visit (INDEPENDENT_AMBULATORY_CARE_PROVIDER_SITE_OTHER): Payer: Medicare Other | Admitting: Family Medicine

## 2021-02-01 VITALS — BP 135/101 | HR 93 | Wt 116.0 lb

## 2021-02-01 VITALS — BP 110/70 | Ht 71.0 in | Wt 116.0 lb

## 2021-02-01 DIAGNOSIS — F1010 Alcohol abuse, uncomplicated: Secondary | ICD-10-CM | POA: Diagnosis not present

## 2021-02-01 DIAGNOSIS — R5383 Other fatigue: Secondary | ICD-10-CM | POA: Diagnosis not present

## 2021-02-01 DIAGNOSIS — R569 Unspecified convulsions: Secondary | ICD-10-CM

## 2021-02-01 DIAGNOSIS — F1029 Alcohol dependence with unspecified alcohol-induced disorder: Secondary | ICD-10-CM | POA: Diagnosis not present

## 2021-02-01 DIAGNOSIS — R630 Anorexia: Secondary | ICD-10-CM

## 2021-02-01 DIAGNOSIS — N1831 Chronic kidney disease, stage 3a: Secondary | ICD-10-CM | POA: Diagnosis not present

## 2021-02-01 DIAGNOSIS — E039 Hypothyroidism, unspecified: Secondary | ICD-10-CM | POA: Diagnosis present

## 2021-02-01 DIAGNOSIS — Z23 Encounter for immunization: Secondary | ICD-10-CM

## 2021-02-01 DIAGNOSIS — E7800 Pure hypercholesterolemia, unspecified: Secondary | ICD-10-CM | POA: Diagnosis not present

## 2021-02-01 DIAGNOSIS — Z1211 Encounter for screening for malignant neoplasm of colon: Secondary | ICD-10-CM | POA: Diagnosis not present

## 2021-02-01 DIAGNOSIS — Z0000 Encounter for general adult medical examination without abnormal findings: Secondary | ICD-10-CM | POA: Diagnosis not present

## 2021-02-01 MED ORDER — LEVETIRACETAM 100 MG/ML PO SOLN: 250.0000 mg | Freq: Two times a day (BID) | ORAL | 0 refills | Status: DC

## 2021-02-01 NOTE — Assessment & Plan Note (Signed)
Repeat TSH today, will hold off on re-prescribing levothyroxine due to patient not having the medication in over a year and see what his levels show.

## 2021-02-01 NOTE — Progress Notes (Signed)
    SUBJECTIVE:   CHIEF COMPLAINT / HPI:   Hypothyroidism Last TSH 07/20/2019, currently on levothyroxine 111mcg. Has been out of his thyroid medication for over a year. Cold intolerance, fatigue, muscle tightness and pain around his jaw  Seizure disorder Home medications include Keppra 250mg  twice daily. Last evaluated by neurology in August 2021. Has been out of his medication for 2 weeks.  CKD Stage III Will obtain a CMP today  History of alcohol use with elevated liver enzymes Last CMP in May 2021 showed elevated AST of 59, has been intermittently elevated in the last as well. Does still drink about 1-2 12oz beers per day.   Decreased appetite Only will eats parts of his meals and some days will not eat at all. Will not use supplemental Ensure. Patient himself does not feel like he doesn't have an appetite issue but the family is concerned. Family notes that megace has previously helped his appetite.    PERTINENT  PMH / PSH: Reviewed   OBJECTIVE:   BP (!) 135/101   Pulse 93   Wt 116 lb (52.6 kg)   SpO2 100%   BMI 15.95 kg/m   Gen: well-appearing, NAD CV: RRR, no m/r/g appreciated, no peripheral edema Pulm: CTAB, no wheezes/crackles GI: soft, non-tender, non-distended  ASSESSMENT/PLAN:   Hypothyroidism Repeat TSH today, will hold off on re-prescribing levothyroxine due to patient not having the medication in over a year and see what his levels show.  CKD (chronic kidney disease), stage III CMP today to monitor kidney function  Seizure ([place]) Has been out of Keppra for 2 weeks, has not followed up with neurology in 1 year.  - Refilled Keppra - Encouraged family to reach out to neuro  Alcohol dependence ([place]) Alcohol use about 1-2 12oz beers per day. Previously elevated AST levels. - Repeat CMP today   Decreased appetite Patient with decreasing appetite per the family, patient does not have the urge to eat. Patient is reassuringly the same weight as last year,  which is still low but has been stable for about 2 years. Consider that his age and conditions may be contributing to this. Discussed with family about waiting for the albumin level to see where he is at and if megace is appropriate (per family request).   Healthcare maintenance - Discussed advanced directives, paperwork given to family - Referral to GI placed for colonoscopy - Flu shot was already completed this year, awaiting confirmation for date from family  [name], [place]

## 2021-02-01 NOTE — Patient Instructions (Signed)
You spoke to [name], [place]  for your annual wellness visit.  We discussed goals:   Goals      DIET - INCREASE WATER INTAKE       We also discussed recommended health maintenance. As discussed, you are due for: Health Maintenance  Topic Date Due   Zoster Vaccines- Shingrix (1 of 2) Never done   COLONOSCOPY (Pts 45-32yrs Insurance coverage will need to be confirmed)  Never done   Pneumonia Vaccine 31+ Years old (2 - PPSV23 if available, else PCV20) 02/13/2019   TETANUS/TDAP  12/08/2023   INFLUENZA VACCINE  Completed   COVID-19 Vaccine  Completed   Hepatitis C Screening  Completed   HPV VACCINES  Aged Out   You received a Covid Booster today.  FU with PCP as needed.  Complete the advance directive packet I gave you.   Preventive Care 27 Years and Older, Male Preventive care refers to lifestyle choices and visits with your health care provider that can promote health and wellness. Preventive care visits are also called wellness exams. What can I expect for my preventive care visit? Counseling During your preventive care visit, your health care provider may ask about your: Medical history, including: Past medical problems. Family medical history. History of falls. Current health, including: Emotional well-being. Home life and relationship well-being. Sexual activity. Memory and ability to understand (cognition). Lifestyle, including: Alcohol, nicotine or tobacco, and drug use. Access to firearms. Diet, exercise, and sleep habits. Work and work Statistician. Sunscreen use. Safety issues such as seatbelt and bike helmet use. Physical exam Your health care provider will check your: Height and weight. These may be used to calculate your BMI (body mass index). BMI is a measurement that tells if you are at a healthy weight. Waist circumference. This measures the distance around your waistline. This measurement also tells if you are at a healthy weight and may help predict  your risk of certain diseases, such as type 2 diabetes and high blood pressure. Heart rate and blood pressure. Body temperature. Skin for abnormal spots. What immunizations do I need? Vaccines are usually given at various ages, according to a schedule. Your health care provider will recommend vaccines for you based on your age, medical history, and lifestyle or other factors, such as travel or where you work. What tests do I need? Screening Your health care provider may recommend screening tests for certain conditions. This may include: Lipid and cholesterol levels. Diabetes screening. This is done by checking your blood sugar (glucose) after you have not eaten for a while (fasting). Hepatitis C test. Hepatitis B test. HIV (human immunodeficiency virus) test. STI (sexually transmitted infection) testing, if you are at risk. Lung cancer screening. Colorectal cancer screening. Prostate cancer screening. Abdominal aortic aneurysm (AAA) screening. You may need this if you are a current or former smoker. Talk with your health care provider about your test results, treatment options, and if necessary, the need for more tests. Follow these instructions at home: Eating and drinking  Eat a diet that includes fresh fruits and vegetables, whole grains, lean protein, and low-fat dairy products. Limit your intake of foods with high amounts of sugar, saturated fats, and salt. Take vitamin and mineral supplements as recommended by your health care provider. Do not drink alcohol if your health care provider tells you not to drink. If you drink alcohol: Limit how much you have to 0-2 drinks a day. Know how much alcohol is in your drink. In the [place]  one drink equals one 12 oz bottle of beer (355 mL), one 5 oz glass of wine (148 mL), or one 1 oz glass of hard liquor (44 mL). Lifestyle Brush your teeth every morning and night with fluoride toothpaste. Floss one time each day. Exercise for at least 30  minutes 5 or more days each week. Do not use any products that contain nicotine or tobacco. These products include cigarettes, chewing tobacco, and vaping devices, such as e-cigarettes. If you need help quitting, ask your health care provider. Do not use drugs. If you are sexually active, practice safe sex. Use a condom or other form of protection to prevent STIs. Take aspirin only as told by your health care provider. Make sure that you understand how much to take and what form to take. Work with your health care provider to find out whether it is safe and beneficial for you to take aspirin daily. Ask your health care provider if you need to take a cholesterol-lowering medicine (statin). Find healthy ways to manage stress, such as: Meditation, yoga, or listening to music. Journaling. Talking to a trusted person. Spending time with friends and family. Safety Always wear your seat belt while driving or riding in a vehicle. Do not drive: If you have been drinking alcohol. Do not ride with someone who has been drinking. When you are tired or distracted. While texting. If you have been using any mind-altering substances or drugs. Wear a helmet and other protective equipment during sports activities. If you have firearms in your house, make sure you follow all gun safety procedures. Minimize exposure to UV radiation to reduce your risk of skin cancer. What's next? Visit your health care provider once a year for an annual wellness visit. Ask your health care provider how often you should have your eyes and teeth checked. Stay up to date on all vaccines. This information is not intended to replace advice given to you by your health care provider. Make sure you discuss any questions you have with your health care provider. Document Revised: 08/18/2020 Document Reviewed: 08/18/2020 Elsevier Patient Education  2022 [place] Prevention in the Home, Adult Falls can cause injuries and  can happen to people of all ages. There are many things you can do to make your home safe and to help prevent falls. Ask for help when making these changes. What actions can I take to prevent falls? General Instructions Use good lighting in all rooms. Replace any light bulbs that burn out. Turn on the lights in dark areas. Use night-lights. Keep items that you use often in easy-to-reach places. Lower the shelves around your home if needed. Set up your furniture so you have a clear path. Avoid moving your furniture around. Do not have throw rugs or other things on the floor that can make you trip. Avoid walking on wet floors. If any of your floors are uneven, fix them. Add color or contrast paint or tape to clearly mark and help you see: Grab bars or handrails. First and last steps of staircases. Where the edge of each step is. If you use a stepladder: Make sure that it is fully opened. Do not climb a closed stepladder. Make sure the sides of the stepladder are locked in place. Ask someone to hold the stepladder while you use it. Know where your pets are when moving through your home. What can I do in the bathroom?   Keep the floor dry. Clean up any water on the  floor right away. Remove soap buildup in the tub or shower. Use nonskid mats or decals on the floor of the tub or shower. Attach bath mats securely with double-sided, nonslip rug tape. If you need to sit down in the shower, use a plastic, nonslip stool. Install grab bars by the toilet and in the tub and shower. Do not use towel bars as grab bars. What can I do in the bedroom? Make sure that you have a light by your bed that is easy to reach. Do not use any sheets or blankets for your bed that hang to the floor. Have a firm chair with side arms that you can use for support when you get dressed. What can I do in the kitchen? Clean up any spills right away. If you need to reach something above you, use a step stool with a grab  bar. Keep electrical cords out of the way. Do not use floor polish or wax that makes floors slippery. What can I do with my stairs? Do not leave any items on the stairs. Make sure that you have a light switch at the top and the bottom of the stairs. Make sure that there are handrails on both sides of the stairs. Fix handrails that are broken or loose. Install nonslip stair treads on all your stairs. Avoid having throw rugs at the top or bottom of the stairs. Choose a carpet that does not hide the edge of the steps on the stairs. Check carpeting to make sure that it is firmly attached to the stairs. Fix carpet that is loose or worn. What can I do on the outside of my home? Use bright outdoor lighting. Fix the edges of walkways and driveways and fix any cracks. Remove anything that might make you trip as you walk through a door, such as a raised step or threshold. Trim any bushes or trees on paths to your home. Check to see if handrails are loose or broken and that both sides of all steps have handrails. Install guardrails along the edges of any raised decks and porches. Clear paths of anything that can make you trip, such as tools or rocks. Have leaves, snow, or ice cleared regularly. Use sand or salt on paths during winter. Clean up any spills in your garage right away. This includes grease or oil spills. What other actions can I take? Wear shoes that: Have a low heel. Do not wear high heels. Have rubber bottoms. Feel good on your feet and fit well. Are closed at the toe. Do not wear open-toe sandals. Use tools that help you move around if needed. These include: Canes. Walkers. Scooters. Crutches. Review your medicines with your doctor. Some medicines can make you feel dizzy. This can increase your chance of falling. Ask your doctor what else you can do to help prevent falls. Where to find more information Centers for Disease Control and Prevention, [place]: http://www.wolf.info/ National  Institute on Aging: http://kim-miller.com/ Contact a doctor if: You are afraid of falling at home. You feel weak, drowsy, or dizzy at home. You fall at home. Summary There are many simple things that you can do to make your home safe and to help prevent falls. Ways to make your home safe include removing things that can make you trip and installing grab bars in the bathroom. Ask for help when making these changes in your home. This information is not intended to replace advice given to you by your health care provider. Make sure  you discuss any questions you have with your health care provider. Document Revised: 09/24/2019 Document Reviewed: 09/24/2019 Elsevier Patient Education  2022 [employer].  Our clinic's number is [phone]. Please call with questions or concerns about what we discussed today.

## 2021-02-01 NOTE — Assessment & Plan Note (Signed)
Alcohol use about 1-2 12oz beers per day. Previously elevated AST levels. - Repeat CMP today

## 2021-02-01 NOTE — Assessment & Plan Note (Signed)
Has been out of [place] for 2 weeks, has not followed up with neurology in 1 year.  - Refilled Keppra - Encouraged family to reach out to neuro
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
## 2021-02-01 NOTE — Assessment & Plan Note (Signed)
CMP today to monitor kidney function

## 2021-02-01 NOTE — Progress Notes (Deleted)
    SUBJECTIVE:   CHIEF COMPLAINT / HPI:   [name] (MRN: [id number]) is a 70 y.o. male with a history of ***  Appetite Megace is wanted and has taken before, did uincrease appetite. Patient does nto feel like he has appetite problem. He will fix breakfast but will only eat part of it. Little bit of dinner. Some days does not eat at all. Been going on for 6 months. Does not like boost.  Seizures No seizure meds in 2 weeks, last one over month ago. No episodes.  Daily aspirin for heart.  Thyroid No thyroid meds since last office visit in 2021.  Swallow study - hard to eat.  Flu shot last mont  OBJECTIVE:   BP (!) 135/101   Pulse 93   Wt 116 lb (52.6 kg)   SpO2 100%   BMI 15.95 kg/m    PHYSICAL EXAM  GEN: Well-developed, in NAD HEAD: NCAT, neck supple  EENT: PERRL, TM clear bilaterally, pink nasal mucosa, MMM without erythema, lesions, or exudates CVS: RRR, normal S1/S2, no murmurs, rubs, gallops, 2+ radial and DP pulses  RESP: Breathing comfortably on RA, no retractions, wheezes, rhonchi, or crackles ABD: Soft, non-tender, no organomegaly or masses PSY: Normal mood and affect, alert and oriented x3, intact recent and remote memory, normal thought content, thought process, and judgement SKIN: No lesions or rashes  EXT: Moves all extremities equally    ASSESSMENT/PLAN:   No problem-specific Assessment & Plan notes found for this encounter.     [place]

## 2021-02-01 NOTE — Patient Instructions (Signed)
We are going to get labs today.  His weight today was 115 pounds, which has been stable for the last 2 years and last year he was just 116 pounds.  There is not a lot of good data behind it using Megace to improve appetite, but if the labs are concerning we can consider starting that.  We are also going to check his cholesterol, his blood levels for anemia, his thyroid, kidney, liver function.  I am refilling his Keppra medication.  We will hold off on refilling his levothyroxine until I have his thyroid level back.   A referral has been placed for him to get his colonoscopy as well and they should be in contact with you in the next several weeks.

## 2021-02-02 LAB — COMPREHENSIVE METABOLIC PANEL WITH GFR
ALT: 15 [IU]/L (ref 0–44)
AST: 36 [IU]/L (ref 0–40)
Albumin/Globulin Ratio: 1 — ABNORMAL LOW (ref 1.2–2.2)
Albumin: 4.5 g/dL (ref 3.8–4.8)
Alkaline Phosphatase: 85 [IU]/L (ref 44–121)
BUN/Creatinine Ratio: 8 — ABNORMAL LOW (ref 10–24)
BUN: 15 mg/dL (ref 8–27)
Bilirubin Total: 1 mg/dL (ref 0.0–1.2)
CO2: 24 mmol/L (ref 20–29)
Calcium: 9.3 mg/dL (ref 8.6–10.2)
Chloride: 100 mmol/L (ref 96–106)
Creatinine, Ser: 1.99 mg/dL — ABNORMAL HIGH (ref 0.76–1.27)
Globulin, Total: 4.4 g/dL (ref 1.5–4.5)
Glucose: 96 mg/dL (ref 70–99)
Potassium: 4.3 mmol/L (ref 3.5–5.2)
Sodium: 137 mmol/L (ref 134–144)
Total Protein: 8.9 g/dL — ABNORMAL HIGH (ref 6.0–8.5)
eGFR: 36 mL/min/{1.73_m2} — ABNORMAL LOW

## 2021-02-02 LAB — LIPID PANEL
Chol/HDL Ratio: 3.5 ratio (ref 0.0–5.0)
Cholesterol, Total: 199 mg/dL (ref 100–199)
HDL: 57 mg/dL (ref >39)
LDL Chol Calc (NIH): 127 mg/dL — ABNORMAL HIGH (ref 0–99)
Triglycerides: 83 mg/dL (ref 0–149)
VLDL Cholesterol Cal: 15 mg/dL (ref 5–40)

## 2021-02-02 LAB — CBC
Hematocrit: 31.6 % — ABNORMAL LOW (ref 37.5–51.0)
Hemoglobin: 10.4 g/dL — ABNORMAL LOW (ref 13.0–17.7)
MCH: 29.1 pg (ref 26.6–33.0)
MCHC: 32.9 g/dL (ref 31.5–35.7)
MCV: 88 fL (ref 79–97)
Platelets: 190 10*3/uL (ref 150–450)
RBC: 3.58 x10E6/uL — ABNORMAL LOW (ref 4.14–5.80)
RDW: 13.3 % (ref 11.6–15.4)
WBC: 4.7 10*3/uL (ref 3.4–10.8)

## 2021-02-02 LAB — TSH: TSH: 262 u[IU]/mL — ABNORMAL HIGH (ref 0.450–4.500)

## 2021-02-02 NOTE — Progress Notes (Signed)
I have reviewed this visit and agree with the documentation.   

## 2021-02-04 ENCOUNTER — Telehealth: Payer: Self-pay | Admitting: Family Medicine

## 2021-02-04 ENCOUNTER — Other Ambulatory Visit: Payer: Self-pay | Admitting: Family Medicine

## 2021-02-04 MED ORDER — MEGESTROL ACETATE 40 MG/ML PO SUSP: 400.0000 mg | Freq: Two times a day (BID) | ORAL | 2 refills | Status: DC

## 2021-02-04 MED ORDER — LEVOTHYROXINE SODIUM 137 MCG PO TABS
ORAL_TABLET | ORAL | 1 refills | Status: DC
Start: 2021-02-04 — End: 2021-08-29

## 2021-02-04 NOTE — Telephone Encounter (Signed)
Called patient's daughter (who is also POA) in rearguard to patient's labwork. TSH was significantly elevated and we will restart his home levothyroxine and have him come back in 6 weeks for repeat TSH and see the response at that time.  His albumin is also significantly low, he has poor oral intake at baseline and a history of alcohol use. Daughter would like to have Megace added back on for him as it previously helped his appetite. Patient has a history of radiation to his throat and prior PEG tube; she has since had him evaluated with speech therapy to ensure he is good for eating and drinking by mouth. Counseled family on studies not showing much benefit of the medication, but will prescribe it per their request and we will follow up in a few weeks. It is not preferred due to his age but we will monitor closely and evaluate for other options as well.   Patient also has worsening kidney function, we will continue to monitor and re-evaluate with his next labs and check through medications to see if there are protectant medications from which he would get benefit and if they are appropriate.   Briasia Flinders, DO

## 2021-04-29 ENCOUNTER — Other Ambulatory Visit: Payer: Self-pay | Admitting: Family Medicine

## 2021-08-09 ENCOUNTER — Other Ambulatory Visit: Payer: Self-pay | Admitting: Family Medicine

## 2021-08-09 ENCOUNTER — Encounter: Payer: Self-pay | Admitting: *Deleted

## 2021-08-27 ENCOUNTER — Other Ambulatory Visit: Payer: Self-pay | Admitting: Family Medicine

## 2021-11-17 ENCOUNTER — Other Ambulatory Visit: Payer: Self-pay | Admitting: Family Medicine

## 2021-12-26 NOTE — Progress Notes (Signed)
    SUBJECTIVE:   CHIEF COMPLAINT / HPI:   [name] is a 71 y.o. male who presents to the [facility] clinic today accompanied by his caregiver to discuss the following concerns:   L ear pain, Sore throat  He has been complaining of a sore throat and left ear pain for the last week. The symptoms started together. No congestion, fever. He took some over the counter medication for the pain which helped a little bit. No cough. No sick contacts.  When asked about his sore throat today he does not report having any discomfort. He points to the left-side of his neck which he reports feels "tight"/  His caretaker was able to call his daughter who is his healthcare power of attorney.  She reported that because patient had noted symptoms of throat "itchiness" a few days ago she scheduled this appointment for him.  She just wanted to make sure everything was okay.  He does have history of mouth and throat cancer for which she has received radiation.  She notes since the radiation he has had chronic skin thinning and tightening around his neck along with chronic changes to his ear.  She reports they had trouble fitting and hearing aids to his left ear due to these changes.  PERTINENT  PMH / PSH: Hypertension, throat cancer,mouth cancer, alcohol dependence, seizures, CKD  OBJECTIVE:   BP (!) 158/98   Pulse 98   Ht '5\' 11"'$  (1.803 m)   Wt 109 lb (49.4 kg)   SpO2 92%   BMI 15.20 kg/m    General: NAD, pleasant, able to participate in exam HEENT: Normocephalic, L TM with chronic post-inflammatory changes ?mass at 11 oclock position, R TM normal, oropharynx clear without erythema Neck: s/p tracheostomy, submandibularly area appears hardened b/l with slight prominence to left-side Cardiac: RRR, no murmurs. Respiratory: CTAB, normal effort, No wheezes, rales or rhonchi Psych: Normal affect and mood  ASSESSMENT/PLAN:   Left ear pain Examination notable for changes to TM that may be chronic from his  radiation. There was a potential mass around 11 o'clock position- it was hard to make out exactly. Daughter adamantly stated over the phone that these changes are not new and that they are a result of his previous radiation.  -Recommend ENT f/u   Neck pain S/p radiation for mouth and throat cancer. Followed by oncology and ENT. Examination notable for hardened area under mandible and to left lateral neck. The area to left superior/lateral neck is where patient noted discomfort. His daughter feels that these changes are likely sequela from his radiation. -Recommend ENT follow up -If having sore throat can trial lozenges or spray for symptomatic relief.  -Can trial Zyrtec if having "itchy" sensation again     Sharion Settler, [place]

## 2021-12-27 ENCOUNTER — Ambulatory Visit (INDEPENDENT_AMBULATORY_CARE_PROVIDER_SITE_OTHER): Payer: Medicare Other | Admitting: Family Medicine

## 2021-12-27 DIAGNOSIS — H9202 Otalgia, left ear: Secondary | ICD-10-CM | POA: Diagnosis not present

## 2021-12-27 DIAGNOSIS — M542 Cervicalgia: Secondary | ICD-10-CM

## 2021-12-27 NOTE — Patient Instructions (Addendum)
It was wonderful to see you today.  Please bring ALL of your medications with you to every visit.   Today we talked about:  -You can trial over the counter sore throat spray as needed.  -If your throat is itchy, you could try some Zyrtec over the counter. -I would recommend calling to schedule an appointment with your ENT doctor if your symptoms don't improve -Return for fevers, shortness of breath or worsening symptoms.   Thank you for coming to your visit as scheduled. We have had a large "no-show" problem lately, and this significantly limits our ability to see and care for patients. As a friendly reminder- if you cannot make your appointment please call to cancel. We do have a no show policy for those who do not cancel within 24 hours. Our policy is that if you miss or fail to cancel an appointment within 24 hours, 3 times in a 10-monthperiod, you may be dismissed from our clinic.   Thank you for choosing [facility]   Please call [phone] any questions about today's appointment.  Please be sure to schedule follow up at the front  desk before you leave today.   ASharion Settler DO PGY-3 Family Medicine

## 2021-12-27 NOTE — Assessment & Plan Note (Signed)
Examination notable for changes to TM that may be chronic from his radiation. There was a potential mass around 11 o'clock position- it was hard to make out exactly. Daughter adamantly stated over the phone that these changes are not new and that they are a result of his previous radiation.  -Recommend ENT f/u

## 2021-12-27 NOTE — Assessment & Plan Note (Signed)
S/p radiation for mouth and throat cancer. Followed by oncology and ENT. Examination notable for hardened area under mandible and to left lateral neck. The area to left superior/lateral neck is where patient noted discomfort. His daughter feels that these changes are likely sequela from his radiation. -Recommend ENT follow up -If having sore throat can trial lozenges or spray for symptomatic relief.  -Can trial Zyrtec if having "itchy" sensation again

## 2021-12-30 ENCOUNTER — Ambulatory Visit (INDEPENDENT_AMBULATORY_CARE_PROVIDER_SITE_OTHER): Payer: Medicare Other

## 2021-12-30 DIAGNOSIS — Z23 Encounter for immunization: Secondary | ICD-10-CM | POA: Diagnosis not present

## 2022-03-03 ENCOUNTER — Ambulatory Visit (INDEPENDENT_AMBULATORY_CARE_PROVIDER_SITE_OTHER): Payer: Medicare Other

## 2022-03-03 VITALS — Ht 71.0 in | Wt 107.0 lb

## 2022-03-03 DIAGNOSIS — Z0101 Encounter for examination of eyes and vision with abnormal findings: Secondary | ICD-10-CM

## 2022-03-03 DIAGNOSIS — Z0000 Encounter for general adult medical examination without abnormal findings: Secondary | ICD-10-CM | POA: Diagnosis not present

## 2022-03-03 DIAGNOSIS — Z87891 Personal history of nicotine dependence: Secondary | ICD-10-CM

## 2022-03-03 NOTE — Patient Instructions (Signed)
[name] , Thank you for taking time to come for your Medicare Wellness Visit. I appreciate your ongoing commitment to your health goals. Please review the following plan we discussed and let me know if I can assist you in the future.   These are the goals we discussed:  Goals      DIET - INCREASE WATER INTAKE        This is a list of the screening recommended for you and due dates:  Health Maintenance  Topic Date Due   Zoster (Shingles) Vaccine (1 of 2) Never done   Colon Cancer Screening  Never done   Medicare Annual Wellness Visit  02/01/2022   COVID-19 Vaccine (5 - 2023-24 season) 02/05/2023*   Pneumonia Vaccine (2 - PPSV23 or PCV20) 02/12/2023*   DTaP/Tdap/Td vaccine (2 - Td or Tdap) 12/08/2023   Flu Shot  Completed   Hepatitis C Screening: USPSTF Recommendation to screen - Ages 18-79 yo.  Completed   HPV Vaccine  Aged Out  *Topic was postponed. The date shown is not the original due date.    Advanced directives: Daughter POA  Conditions/risks identified: follow up with neurology and ophthalmology  Next appointment: Follow up in one year for your annual wellness visit.   Preventive Care 24 Years and Older, Male  Preventive care refers to lifestyle choices and visits with your health care provider that can promote health and wellness. What does preventive care include? A yearly physical exam. This is also called an annual well check. Dental exams once or twice a year. Routine eye exams. Ask your health care provider how often you should have your eyes checked. Personal lifestyle choices, including: Daily care of your teeth and gums. Regular physical activity. Eating a healthy diet. Avoiding tobacco and drug use. Limiting alcohol use. Practicing safe sex. Taking low doses of aspirin every day. Taking vitamin and mineral supplements as recommended by your health care provider. What happens during an annual well check? The services and screenings done by your health  care provider during your annual well check will depend on your age, overall health, lifestyle risk factors, and family history of disease. Counseling  Your health care provider may ask you questions about your: Alcohol use. Tobacco use. Drug use. Emotional well-being. Home and relationship well-being. Sexual activity. Eating habits. History of falls. Memory and ability to understand (cognition). Work and work Statistician. Screening  You may have the following tests or measurements: Height, weight, and BMI. Blood pressure. Lipid and cholesterol levels. These may be checked every 5 years, or more frequently if you are over 56 years old. Skin check. Lung cancer screening. You may have this screening every year starting at age 107 if you have a 30-pack-year history of smoking and currently smoke or have quit within the past 15 years. Fecal occult blood test (FOBT) of the stool. You may have this test every year starting at age 59. Flexible sigmoidoscopy or colonoscopy. You may have a sigmoidoscopy every 5 years or a colonoscopy every 10 years starting at age 70. Prostate cancer screening. Recommendations will vary depending on your family history and other risks. Hepatitis C blood test. Hepatitis B blood test. Sexually transmitted disease (STD) testing. Diabetes screening. This is done by checking your blood sugar (glucose) after you have not eaten for a while (fasting). You may have this done every 1-3 years. Abdominal aortic aneurysm (AAA) screening. You may need this if you are a current or former smoker. Osteoporosis. You may be  screened starting at age 3 if you are at high risk. Talk with your health care provider about your test results, treatment options, and if necessary, the need for more tests. Vaccines  Your health care provider may recommend certain vaccines, such as: Influenza vaccine. This is recommended every year. Tetanus, diphtheria, and acellular pertussis (Tdap, Td)  vaccine. You may need a Td booster every 10 years. Zoster vaccine. You may need this after age 23. Pneumococcal 13-valent conjugate (PCV13) vaccine. One dose is recommended after age 47. Pneumococcal polysaccharide (PPSV23) vaccine. One dose is recommended after age 24. Talk to your health care provider about which screenings and vaccines you need and how often you need them. This information is not intended to replace advice given to you by your health care provider. Make sure you discuss any questions you have with your health care provider. Document Released: 03/19/2015 Document Revised: 11/10/2015 Document Reviewed: 12/22/2014 Elsevier Interactive Patient Education  2017 [place] Prevention in the Home Falls can cause injuries. They can happen to people of all ages. There are many things you can do to make your home safe and to help prevent falls. What can I do on the outside of my home? Regularly fix the edges of walkways and driveways and fix any cracks. Remove anything that might make you trip as you walk through a door, such as a raised step or threshold. Trim any bushes or trees on the path to your home. Use bright outdoor lighting. Clear any walking paths of anything that might make someone trip, such as rocks or tools. Regularly check to see if handrails are loose or broken. Make sure that both sides of any steps have handrails. Any raised decks and porches should have guardrails on the edges. Have any leaves, snow, or ice cleared regularly. Use sand or salt on walking paths during winter. Clean up any spills in your garage right away. This includes oil or grease spills. What can I do in the bathroom? Use night lights. Install grab bars by the toilet and in the tub and shower. Do not use towel bars as grab bars. Use non-skid mats or decals in the tub or shower. If you need to sit down in the shower, use a plastic, non-slip stool. Keep the floor dry. Clean up any  water that spills on the floor as soon as it happens. Remove soap buildup in the tub or shower regularly. Attach bath mats securely with double-sided non-slip rug tape. Do not have throw rugs and other things on the floor that can make you trip. What can I do in the bedroom? Use night lights. Make sure that you have a light by your bed that is easy to reach. Do not use any sheets or blankets that are too big for your bed. They should not hang down onto the floor. Have a firm chair that has side arms. You can use this for support while you get dressed. Do not have throw rugs and other things on the floor that can make you trip. What can I do in the kitchen? Clean up any spills right away. Avoid walking on wet floors. Keep items that you use a lot in easy-to-reach places. If you need to reach something above you, use a strong step stool that has a grab bar. Keep electrical cords out of the way. Do not use floor polish or wax that makes floors slippery. If you must use wax, use non-skid floor wax. Do not have  throw rugs and other things on the floor that can make you trip. What can I do with my stairs? Do not leave any items on the stairs. Make sure that there are handrails on both sides of the stairs and use them. Fix handrails that are broken or loose. Make sure that handrails are as long as the stairways. Check any carpeting to make sure that it is firmly attached to the stairs. Fix any carpet that is loose or worn. Avoid having throw rugs at the top or bottom of the stairs. If you do have throw rugs, attach them to the floor with carpet tape. Make sure that you have a light switch at the top of the stairs and the bottom of the stairs. If you do not have them, ask someone to add them for you. What else can I do to help prevent falls? Wear shoes that: Do not have high heels. Have rubber bottoms. Are comfortable and fit you well. Are closed at the toe. Do not wear sandals. If you use a  stepladder: Make sure that it is fully opened. Do not climb a closed stepladder. Make sure that both sides of the stepladder are locked into place. Ask someone to hold it for you, if possible. Clearly mark and make sure that you can see: Any grab bars or handrails. First and last steps. Where the edge of each step is. Use tools that help you move around (mobility aids) if they are needed. These include: Canes. Walkers. Scooters. Crutches. Turn on the lights when you go into a dark area. Replace any light bulbs as soon as they burn out. Set up your furniture so you have a clear path. Avoid moving your furniture around. If any of your floors are uneven, fix them. If there are any pets around you, be aware of where they are. Review your medicines with your doctor. Some medicines can make you feel dizzy. This can increase your chance of falling. Ask your doctor what other things that you can do to help prevent falls. This information is not intended to replace advice given to you by your health care provider. Make sure you discuss any questions you have with your health care provider. Document Released: 12/17/2008 Document Revised: 07/29/2015 Document Reviewed: 03/27/2014 Elsevier Interactive Patient Education  2017 [employer].
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
## 2022-03-16 DIAGNOSIS — Z9889 Other specified postprocedural states: Secondary | ICD-10-CM | POA: Diagnosis not present

## 2022-03-16 DIAGNOSIS — Z85818 Personal history of malignant neoplasm of other sites of lip, oral cavity, and pharynx: Secondary | ICD-10-CM | POA: Diagnosis not present

## 2022-03-16 DIAGNOSIS — H6042 Cholesteatoma of left external ear: Secondary | ICD-10-CM | POA: Diagnosis not present

## 2022-03-16 DIAGNOSIS — Z923 Personal history of irradiation: Secondary | ICD-10-CM | POA: Diagnosis not present

## 2022-03-16 DIAGNOSIS — Z8521 Personal history of malignant neoplasm of larynx: Secondary | ICD-10-CM | POA: Diagnosis not present

## 2022-03-16 DIAGNOSIS — H9202 Otalgia, left ear: Secondary | ICD-10-CM | POA: Diagnosis not present

## 2022-03-16 DIAGNOSIS — Z9221 Personal history of antineoplastic chemotherapy: Secondary | ICD-10-CM | POA: Diagnosis not present

## 2022-03-16 DIAGNOSIS — R070 Pain in throat: Secondary | ICD-10-CM | POA: Diagnosis not present

## 2022-03-20 ENCOUNTER — Other Ambulatory Visit: Payer: Self-pay | Admitting: Family Medicine

## 2022-04-14 ENCOUNTER — Encounter (HOSPITAL_BASED_OUTPATIENT_CLINIC_OR_DEPARTMENT_OTHER): Payer: Self-pay | Admitting: Emergency Medicine

## 2022-04-14 ENCOUNTER — Emergency Department (HOSPITAL_BASED_OUTPATIENT_CLINIC_OR_DEPARTMENT_OTHER): Payer: Medicare Other

## 2022-04-14 ENCOUNTER — Inpatient Hospital Stay (HOSPITAL_BASED_OUTPATIENT_CLINIC_OR_DEPARTMENT_OTHER)
Admission: EM | Admit: 2022-04-14 | Discharge: 2022-04-18 | DRG: 872 | Disposition: A | Discharge status: Home / Self Care | Payer: Medicare Other | Attending: Internal Medicine | Admitting: Internal Medicine

## 2022-04-14 ENCOUNTER — Other Ambulatory Visit: Payer: Self-pay

## 2022-04-14 DIAGNOSIS — D631 Anemia in chronic kidney disease: Secondary | ICD-10-CM | POA: Diagnosis present

## 2022-04-14 DIAGNOSIS — Z83438 Family history of other disorder of lipoprotein metabolism and other lipidemia: Secondary | ICD-10-CM

## 2022-04-14 DIAGNOSIS — Z8249 Family history of ischemic heart disease and other diseases of the circulatory system: Secondary | ICD-10-CM

## 2022-04-14 DIAGNOSIS — N400 Enlarged prostate without lower urinary tract symptoms: Secondary | ICD-10-CM | POA: Diagnosis present

## 2022-04-14 DIAGNOSIS — J439 Emphysema, unspecified: Secondary | ICD-10-CM | POA: Diagnosis not present

## 2022-04-14 DIAGNOSIS — Z7982 Long term (current) use of aspirin: Secondary | ICD-10-CM

## 2022-04-14 DIAGNOSIS — N12 Tubulo-interstitial nephritis, not specified as acute or chronic: Secondary | ICD-10-CM

## 2022-04-14 DIAGNOSIS — K7460 Unspecified cirrhosis of liver: Secondary | ICD-10-CM | POA: Diagnosis present

## 2022-04-14 DIAGNOSIS — Z96641 Presence of right artificial hip joint: Secondary | ICD-10-CM | POA: Diagnosis not present

## 2022-04-14 DIAGNOSIS — Z888 Allergy status to other drugs, medicaments and biological substances status: Secondary | ICD-10-CM | POA: Diagnosis not present

## 2022-04-14 DIAGNOSIS — I129 Hypertensive chronic kidney disease with stage 1 through stage 4 chronic kidney disease, or unspecified chronic kidney disease: Secondary | ICD-10-CM | POA: Diagnosis present

## 2022-04-14 DIAGNOSIS — N136 Pyonephrosis: Secondary | ICD-10-CM | POA: Diagnosis present

## 2022-04-14 DIAGNOSIS — I6522 Occlusion and stenosis of left carotid artery: Secondary | ICD-10-CM | POA: Diagnosis not present

## 2022-04-14 DIAGNOSIS — I700 Atherosclerosis of aorta: Secondary | ICD-10-CM | POA: Diagnosis not present

## 2022-04-14 DIAGNOSIS — E785 Hyperlipidemia, unspecified: Secondary | ICD-10-CM | POA: Diagnosis present

## 2022-04-14 DIAGNOSIS — E871 Hypo-osmolality and hyponatremia: Secondary | ICD-10-CM | POA: Diagnosis not present

## 2022-04-14 DIAGNOSIS — M879 Osteonecrosis, unspecified: Secondary | ICD-10-CM | POA: Diagnosis not present

## 2022-04-14 DIAGNOSIS — R911 Solitary pulmonary nodule: Secondary | ICD-10-CM

## 2022-04-14 DIAGNOSIS — E039 Hypothyroidism, unspecified: Secondary | ICD-10-CM | POA: Diagnosis present

## 2022-04-14 DIAGNOSIS — Z87891 Personal history of nicotine dependence: Secondary | ICD-10-CM | POA: Diagnosis not present

## 2022-04-14 DIAGNOSIS — Z79890 Hormone replacement therapy (postmenopausal): Secondary | ICD-10-CM | POA: Diagnosis not present

## 2022-04-14 DIAGNOSIS — R000 Tachycardia, unspecified: Secondary | ICD-10-CM | POA: Diagnosis not present

## 2022-04-14 DIAGNOSIS — Z8521 Personal history of malignant neoplasm of larynx: Secondary | ICD-10-CM | POA: Diagnosis not present

## 2022-04-14 DIAGNOSIS — A419 Sepsis, unspecified organism: Secondary | ICD-10-CM | POA: Diagnosis present

## 2022-04-14 DIAGNOSIS — D320 Benign neoplasm of cerebral meninges: Secondary | ICD-10-CM | POA: Diagnosis present

## 2022-04-14 DIAGNOSIS — Z1152 Encounter for screening for COVID-19: Secondary | ICD-10-CM

## 2022-04-14 DIAGNOSIS — N1831 Chronic kidney disease, stage 3a: Secondary | ICD-10-CM | POA: Diagnosis present

## 2022-04-14 DIAGNOSIS — J449 Chronic obstructive pulmonary disease, unspecified: Secondary | ICD-10-CM | POA: Diagnosis not present

## 2022-04-14 DIAGNOSIS — R6883 Chills (without fever): Secondary | ICD-10-CM | POA: Diagnosis not present

## 2022-04-14 DIAGNOSIS — R059 Cough, unspecified: Secondary | ICD-10-CM | POA: Diagnosis not present

## 2022-04-14 DIAGNOSIS — Z825 Family history of asthma and other chronic lower respiratory diseases: Secondary | ICD-10-CM | POA: Diagnosis not present

## 2022-04-14 DIAGNOSIS — D329 Benign neoplasm of meninges, unspecified: Secondary | ICD-10-CM

## 2022-04-14 DIAGNOSIS — N1330 Unspecified hydronephrosis: Secondary | ICD-10-CM | POA: Diagnosis not present

## 2022-04-14 DIAGNOSIS — R296 Repeated falls: Principal | ICD-10-CM

## 2022-04-14 DIAGNOSIS — Z85819 Personal history of malignant neoplasm of unspecified site of lip, oral cavity, and pharynx: Secondary | ICD-10-CM | POA: Diagnosis not present

## 2022-04-14 DIAGNOSIS — N390 Urinary tract infection, site not specified: Secondary | ICD-10-CM | POA: Diagnosis present

## 2022-04-14 LAB — CBC WITH DIFFERENTIAL/PLATELET
Abs Immature Granulocytes: 0.07 10*3/uL (ref 0.00–0.07)
Basophils Absolute: 0 10*3/uL (ref 0.0–0.1)
Basophils Relative: 0 %
Eosinophils Absolute: 0 10*3/uL (ref 0.0–0.5)
Eosinophils Relative: 0 %
HCT: 29.4 % — ABNORMAL LOW (ref 39.0–52.0)
Hemoglobin: 9.5 g/dL — ABNORMAL LOW (ref 13.0–17.0)
Immature Granulocytes: 1 %
Lymphocytes Relative: 11 %
Lymphs Abs: 1.3 10*3/uL (ref 0.7–4.0)
MCH: 25.7 pg — ABNORMAL LOW (ref 26.0–34.0)
MCHC: 32.3 g/dL (ref 30.0–36.0)
MCV: 79.7 fL — ABNORMAL LOW (ref 80.0–100.0)
Monocytes Absolute: 1.5 10*3/uL — ABNORMAL HIGH (ref 0.1–1.0)
Monocytes Relative: 12 %
Neutro Abs: 9.5 10*3/uL — ABNORMAL HIGH (ref 1.7–7.7)
Neutrophils Relative %: 76 %
Platelets: 239 10*3/uL (ref 150–400)
RBC: 3.69 MIL/uL — ABNORMAL LOW (ref 4.22–5.81)
RDW: 16.8 % — ABNORMAL HIGH (ref 11.5–15.5)
WBC: 12.4 10*3/uL — ABNORMAL HIGH (ref 4.0–10.5)
nRBC: 0 % (ref 0.0–0.2)

## 2022-04-14 LAB — URINALYSIS, W/ REFLEX TO CULTURE (INFECTION SUSPECTED)
Bilirubin Urine: NEGATIVE
Glucose, UA: NEGATIVE mg/dL
Ketones, ur: NEGATIVE mg/dL
Nitrite: NEGATIVE
Specific Gravity, Urine: 1.012 (ref 1.005–1.030)
pH: 5.5 (ref 5.0–8.0)

## 2022-04-14 LAB — APTT: aPTT: 36 seconds (ref 24–36)

## 2022-04-14 LAB — COMPREHENSIVE METABOLIC PANEL
ALT: 14 U/L (ref 0–44)
AST: 18 U/L (ref 15–41)
Albumin: 3.7 g/dL (ref 3.5–5.0)
Alkaline Phosphatase: 82 U/L (ref 38–126)
Anion gap: 13 (ref 5–15)
BUN: 33 mg/dL — ABNORMAL HIGH (ref 8–23)
CO2: 22 mmol/L (ref 22–32)
Calcium: 9.4 mg/dL (ref 8.9–10.3)
Chloride: 98 mmol/L (ref 98–111)
Creatinine, Ser: 1.39 mg/dL — ABNORMAL HIGH (ref 0.61–1.24)
GFR, Estimated: 55 mL/min — ABNORMAL LOW (ref >60)
Glucose, Bld: 97 mg/dL (ref 70–99)
Potassium: 3.5 mmol/L (ref 3.5–5.1)
Sodium: 133 mmol/L — ABNORMAL LOW (ref 135–145)
Total Bilirubin: 1.6 mg/dL — ABNORMAL HIGH (ref 0.3–1.2)
Total Protein: 8.4 g/dL — ABNORMAL HIGH (ref 6.5–8.1)

## 2022-04-14 LAB — RESP PANEL BY RT-PCR (RSV, FLU A&B, COVID)  RVPGX2
Influenza A by PCR: NEGATIVE
Influenza B by PCR: NEGATIVE
Resp Syncytial Virus by PCR: NEGATIVE
SARS Coronavirus 2 by RT PCR: NEGATIVE

## 2022-04-14 LAB — PROTIME-INR
INR: 1.3 — ABNORMAL HIGH (ref 0.8–1.2)
Prothrombin Time: 16.1 seconds — ABNORMAL HIGH (ref 11.4–15.2)

## 2022-04-14 LAB — LACTIC ACID, PLASMA: Lactic Acid, Venous: 1.8 mmol/L (ref 0.5–1.9)

## 2022-04-14 MED ORDER — PIPERACILLIN-TAZOBACTAM 3.375 G IVPB
3.3750 g | Freq: Three times a day (TID) | INTRAVENOUS | Status: DC
  Administered 2022-04-14 – 2022-04-17 (×10): 3.375 g via INTRAVENOUS
  Filled 2022-04-14 (×10): qty 50

## 2022-04-14 MED ORDER — LACTATED RINGERS IV SOLN: INTRAVENOUS | Status: DC

## 2022-04-14 MED ORDER — LACTATED RINGERS IV BOLUS
1000.0000 mL | Freq: Once | INTRAVENOUS | Status: AC
  Administered 2022-04-14: 1000 mL via INTRAVENOUS

## 2022-04-14 MED ORDER — POTASSIUM CHLORIDE IN NACL 20-0.9 MEQ/L-% IV SOLN
INTRAVENOUS | Status: DC
  Filled 2022-04-14 (×4): qty 1000

## 2022-04-14 MED ORDER — HEPARIN SODIUM (PORCINE) 5000 UNIT/ML IJ SOLN
5000.0000 [IU] | Freq: Three times a day (TID) | INTRAMUSCULAR | Status: DC
  Administered 2022-04-14 – 2022-04-18 (×12): 5000 [IU] via SUBCUTANEOUS
  Filled 2022-04-14 (×12): qty 1

## 2022-04-14 MED ORDER — MORPHINE SULFATE (PF) 2 MG/ML IV SOLN
1.0000 mg | INTRAVENOUS | Status: DC | PRN
  Administered 2022-04-14: 1 mg via INTRAVENOUS
  Filled 2022-04-14 (×3): qty 1

## 2022-04-14 MED ORDER — IOHEXOL 300 MG/ML  SOLN
100.0000 mL | Freq: Once | INTRAMUSCULAR | Status: AC | PRN
  Administered 2022-04-14: 60 mL via INTRAVENOUS

## 2022-04-14 MED ORDER — ONDANSETRON HCL 4 MG/2ML IJ SOLN: 4.0000 mg | Freq: Four times a day (QID) | INTRAMUSCULAR | Status: DC | PRN

## 2022-04-14 MED ORDER — LEVOTHYROXINE SODIUM 25 MCG PO TABS
137.0000 ug | ORAL_TABLET | Freq: Every day | ORAL | Status: DC
  Administered 2022-04-14 – 2022-04-18 (×5): 137 ug via ORAL
  Filled 2022-04-14 (×5): qty 1

## 2022-04-14 MED ORDER — SODIUM CHLORIDE 0.9 % IV SOLN
1.0000 g | Freq: Once | INTRAVENOUS | Status: AC
  Administered 2022-04-14: 1 g via INTRAVENOUS
  Filled 2022-04-14: qty 10

## 2022-04-14 MED ORDER — IBUPROFEN 800 MG PO TABS
800.0000 mg | ORAL_TABLET | Freq: Once | ORAL | Status: AC
  Administered 2022-04-14: 800 mg via ORAL
  Filled 2022-04-14: qty 1

## 2022-04-14 MED ORDER — SODIUM CHLORIDE 0.9 % IV SOLN: 1.0000 g | INTRAVENOUS | Status: DC

## 2022-04-14 NOTE — ED Provider Notes (Signed)
[facility] Provider Note   CSN: [id number] Arrival date & time: 04/14/22  0010     History  Chief Complaint  Patient presents with   Fever   Generalized Body Aches    [name] is a 72 y.o. male.  72 yo M  w/ h/o oral and laryngeal cancer s/p surgery and chemo currently in remission.  He presents with his daughter for illness.  Send the patient has been feeling unwell for about 5 to 7 days.  Just generally weak decreased appetite not feeling himself but then today he started having a fever as high as 103 and decreased appetite even more decreased urine output and it was dark and foul-smelling.  So she brought him here for further evaluation.  Patient states that his stoma hurts.  His daughter states that 3 weeks ago he was seen by his ear nose and throat team and they did some type of evaluation for right ear and right-sided neck pain and told him everything looked okay.   Fever      Home Medications Prior to Admission medications   Medication Sig Start Date End Date Taking? Authorizing Provider  levothyroxine (SYNTHROID) 137 MCG tablet TAKE 1 TABLET(137 MCG) BY MOUTH DAILY BEFORE BREAKFAST 03/20/22   [name], [name], DO      Allergies    Succinylcholine, Tylenol [acetaminophen], Vicodin [hydrocodone-acetaminophen], Other, and Protonix [pantoprazole]    Review of Systems   Review of Systems  Constitutional:  Positive for fever.    Physical Exam Updated Vital Signs BP 91/62   Pulse 72   Temp 98.1 F (36.7 C) (Oral)   Resp (!) 23   Ht 5' 11"$  (1.803 m)   Wt 47.6 kg   SpO2 100%   BMI 14.64 kg/m  Physical Exam Vitals and nursing note reviewed.  Constitutional:      Appearance: He is well-developed.  HENT:     Head: Normocephalic and atraumatic.     Comments: Stoma in anterior neck without fluctuance, drainage, eyrthema, edema or ttp    Ears:     Comments: Left TM with mild effusion Right TM normal     Mouth/Throat:     Mouth: Mucous membranes are dry.  Eyes:     Pupils: Pupils are equal, round, and reactive to light.  Cardiovascular:     Rate and Rhythm: Tachycardia present.  Pulmonary:     Effort: Pulmonary effort is normal. No respiratory distress.  Abdominal:     General: There is no distension.  Musculoskeletal:        General: Normal range of motion.     Cervical back: Normal range of motion.  Neurological:     Mental Status: He is alert.     ED Results / Procedures / Treatments   Labs (all labs ordered are listed, but only abnormal results are displayed) Labs Reviewed  COMPREHENSIVE METABOLIC PANEL - Abnormal; Notable for the following components:      Result Value   Sodium 133 (*)    BUN 33 (*)    Creatinine, Ser 1.39 (*)    Total Protein 8.4 (*)    Total Bilirubin 1.6 (*)    GFR, Estimated 55 (*)    All other components within normal limits  CBC WITH DIFFERENTIAL/PLATELET - Abnormal; Notable for the following components:   WBC 12.4 (*)    RBC 3.69 (*)    Hemoglobin 9.5 (*)    HCT 29.4 (*)  MCV 79.7 (*)    MCH 25.7 (*)    RDW 16.8 (*)    Neutro Abs 9.5 (*)    Monocytes Absolute 1.5 (*)    All other components within normal limits  PROTIME-INR - Abnormal; Notable for the following components:   Prothrombin Time 16.1 (*)    INR 1.3 (*)    All other components within normal limits  URINALYSIS, W/ REFLEX TO CULTURE (INFECTION SUSPECTED) - Abnormal; Notable for the following components:   APPearance HAZY (*)    Hgb urine dipstick TRACE (*)    Protein, ur TRACE (*)    Leukocytes,Ua LARGE (*)    Bacteria, UA FEW (*)    All other components within normal limits  RESP PANEL BY RT-PCR (RSV, FLU A&B, COVID)  RVPGX2  CULTURE, BLOOD (ROUTINE X 2)  CULTURE, BLOOD (ROUTINE X 2)  URINE CULTURE  LACTIC ACID, PLASMA  APTT    EKG None  Radiology CT ABDOMEN PELVIS W CONTRAST  Result Date: 04/14/2022 CLINICAL DATA:  Sepsis. EXAM: CT ABDOMEN AND PELVIS WITH  CONTRAST TECHNIQUE: Multidetector CT imaging of the abdomen and pelvis was performed using the standard protocol following bolus administration of intravenous contrast. RADIATION DOSE REDUCTION: This exam was performed according to the departmental dose-optimization program which includes automated exposure control, adjustment of the mA and/or kV according to patient size and/or use of iterative reconstruction technique. CONTRAST:  87m OMNIPAQUE IOHEXOL 300 MG/ML  SOLN COMPARISON:  CTs with IV contrast 07/30/2014 and 11/14/2010. FINDINGS: Lower chest: There is posterior atelectasis in the lower lobes, calcified granuloma laterally in the left lower lobe. Lung bases are clear of infiltrates. The cardiac size is normal. There are scattered calcifications in the coronary arteries, patchy calcification in the descending aorta, no pericardial effusion. Hepatobiliary: Respiratory motion limits evaluation. There is no obvious mass. There are small stones in the gallbladder but no wall thickening, no biliary dilatation. Pancreas: Coarse calcifications are again seen in the head and uncinate process consistent with chronic calcific pancreatitis. There is slight dilatation of the pancreatic duct which were noted previously and seems unchanged. There are no inflammatory changes. No masses seen through the motion artifact. Spleen: No abnormality is seen through the breathing artifacts. Adrenals/Urinary Tract: There is no adrenal mass. No mass enhancement in either kidney. There is questionable patchy hypoenhancement in the right kidney which could be exaggerated due to artifact from the patient's arms and due to respiratory motion or could indicate pyelonephritis. There is asymmetric right perinephric stranding. Moderately dilated left renal pelvis is chronically noted and unchanged without intrarenal caliectasis likely reflecting as extrarenal pelvis. 8 mm nonobstructive caliceal stone inferior pole right kidney appear  similar. No other nephrolithiasis is seen. Moderate to severe right hydronephrosis is similar to prior studies as well as mild ureterectasis, with no ureteral stone being seen. There is diffuse thickening of the bladder wall, greater to the right than the left. No focal masses seen but portions of the inferior bladder are obscured by spray artifact from a right hip arthroplasty. Stomach/Bowel: No dilatation or wall thickening. Uncomplicated left-sided diverticulosis. An appendix is not seen in this patient. Vascular/Lymphatic: There is heavy aortoiliac calcific plaque with branch vessel atherosclerosis. No AAA. No adenopathy is seen. Reproductive: Prostatomegaly with transverse diameter 4.8 cm. Other: There is minimal fluid around the posterior right kidney. There is minimal abdominal and pelvic ascites. No free hemorrhage or free air is seen. There has been interval weight loss with decreasing thickness of body fat consistent  with cachexia. There are no incarcerated hernias. Musculoskeletal: As above, a right hip arthroplasty is noted with changes of chronic avascular necrosis across the superior left femoral head and mild secondary DJD. No significant articular surface subsidence is seen. There is osteopenia and mild degenerative changes of the lumbar spine. No destructive bone lesion is seen. IMPRESSION: 1. Right perinephric stranding with questionable patchy hypoenhancement in the right kidney which could be due to pyelonephritis, or artifact from the patient's arms and respiratory motion. 2. Chronic moderate to severe right hydronephrosis and mild right ureterectasis with no ureteral or intrarenal stone. The degree of hydronephrosis and ureterectasis seems unchanged. 3. Diffuse thickening of the bladder wall, greater to the right than the left, not seen previously. This could be due to hypertrophy or cystitis. 4. Prostatomegaly. 5. Chronic calcific pancreatitis. No acute inflammation is seen through the  breathing motion. 6. Cholelithiasis. 7. Aortic and coronary artery atherosclerosis. 8. Minimal ascites. 9. Cachexia. 10. Chronic avascular necrosis left femoral head. Aortic Atherosclerosis (ICD10-I70.0). Electronically Signed   By: Telford Nab M.D.   On: 04/14/2022 05:14   CT Soft Tissue Neck W Contrast  Result Date: 04/14/2022 CLINICAL DATA:  72 year old male with a history of treated head and neck cancer. Fever and weakness. Cough, chills. Query stoma abscess. EXAM: CT NECK WITH CONTRAST TECHNIQUE: Multidetector CT imaging of the neck was performed using the standard protocol following the bolus administration of intravenous contrast. RADIATION DOSE REDUCTION: This exam was performed according to the departmental dose-optimization program which includes automated exposure control, adjustment of the mA and/or kV according to patient size and/or use of iterative reconstruction technique. CONTRAST:  45m OMNIPAQUE IOHEXOL 300 MG/ML  SOLN COMPARISON:  The most recent post treatment Neck CT is 04/09/2014. however, comparison is also made to noncontrast cervical spine CT 07/20/2019. FINDINGS: Pharynx and larynx: Chronic laryngectomy with tracheal stoma. Parapharyngeal and retropharyngeal postoperative architectural distortion appears stable since 2016, with multiple chronic bilateral parapharyngeal surgical clips. The nasopharynx contour is normal. The oropharynx is mostly effaced, but appearance is similar to 2016. Mildly gas-filled cervical esophagus. No discrete mass or fluid collection at the stoma site, although the tracheal walls there appear mildly more thickened than in 2016. There is a small stoma tube connecting the trachea and esophagus at the thoracic inlet, new from 2016. Salivary glands: Unchanged post treatment or other chronic submandibular gland atrophy. Sublingual space is stable and within normal limits. Parotid glands are within normal limits. Thyroid: Chronically diminutive. Satisfactory post  tracheal stoma appearance. Lymph nodes: Sequelae of bilateral neck dissection. No lymphadenopathy identified. Surgical clip configuration appears stable since 2016. Vascular: Bilateral internal jugular veins remain in place. Bilateral IJ appear to remain patent, the right is dominant. Right carotid arteries remain patent to the skull base. However, left internal carotid artery has occluded since 2016, with no reconstitution in the neck or at the skull base. Vertebral arteries remain patent, although the right is now atherosclerotic and diminutive. Limited intracranial: Calcified atherosclerosis at the skull base. Evidence of reconstituted enhancement at the left ICA terminus. Chronic round roughly 2 cm enhancing planum sphenoidale mass was 16 mm in 2016 compatible with slowly enlarging meningioma. No evidence of inferior frontal lobe edema or intracranial mass effect. Visualized orbits: Negative. Mastoids and visualized paranasal sinuses: Paranasal sinus aeration is stable to improved since 2016. Tympanic cavities and mastoids are clear. Skeleton: Chronically absent dentition. Chronic cervical spine degeneration. Stable mild chronic T1 anterior compression. No acute or suspicious osseous lesion identified. Upper chest:  Trachea remains patent although with mild increased circumferential wall thickening when compared to 2016. There is a new lobulated 7 mm right upper lobe pulmonary nodule since 2016 on series 3, image 100. This was not included in 2021. Otherwise the upper lungs appear stable. Calcified aortic atherosclerosis. No inflammation is evident in the visible superior mediastinum, although only the pre-vascular space is included. The visible thoracic esophagus is decompressed. IMPRESSION: 1. Chronic laryngectomy with tracheal stoma. Questionable mild increased tracheal wall thickening since 2016 and 2021, consider Tracheitis. But no other inflammatory process is evident in the neck; no fluid collection or  abscess. 2. New 7 mm right upper lobe pulmonary nodule since 2016. Non-contrast chest CT at 6-12 months is recommended. If the nodule is stable at time of repeat CT, then future CT at 18-24 months (from today's scan) is considered optional for low-risk patients, but is recommended for high-risk patients. This recommendation follows the consensus statement: Guidelines for Management of Incidental Pulmonary Nodules Detected on CT Images: From the Fleischner Society 2017; Radiology 2017; 284:228-243. 3. Occluded left internal carotid artery since 2016. 4. Slowly enlarging 2 cm intracranial planum sphenoidale Meningioma. No evidence of adjacent cerebral edema. 5.  Aortic Atherosclerosis (ICD10-I70.0). Electronically Signed   By: Genevie Ann M.D.   On: 04/14/2022 04:31   DG Chest Port 1 View  Result Date: 04/14/2022 CLINICAL DATA:  Questionable sepsis with fever and generalized weakness. Coughing and chills as well. EXAM: PORTABLE CHEST 1 VIEW COMPARISON:  AP Lat 07/04/2016 FINDINGS: The lungs are mildly emphysematous but clear.  The sulci are sharp. Heart size and vasculature are normal apart from mild aortic tortuosity with patchy calcific plaques. Stable mediastinum. The sulci are sharp. Single surgical clip again noted medially right chest apex. Osteopenia. IMPRESSION: No evidence of acute chest process. COPD. Aortic atherosclerosis. Electronically Signed   By: Telford Nab M.D.   On: 04/14/2022 00:52    Procedures .Critical Care  Performed by: Merrily Pew, MD Authorized by: Merrily Pew, MD   Critical care provider statement:    Critical care time (minutes):  30   Critical care was necessary to treat or prevent imminent or life-threatening deterioration of the following conditions:  Sepsis   Critical care was time spent personally by me on the following activities:  Development of treatment plan with patient or surrogate, discussions with consultants, evaluation of patient's response to treatment,  examination of patient, ordering and review of laboratory studies, ordering and review of radiographic studies, ordering and performing treatments and interventions, pulse oximetry, re-evaluation of patient's condition and review of old charts     Medications Ordered in ED Medications  lactated ringers infusion (has no administration in time range)  lactated ringers bolus 1,000 mL (0 mLs Intravenous Stopped 04/14/22 0237)  ibuprofen (ADVIL) tablet 800 mg (800 mg Oral Given 04/14/22 0244)  cefTRIAXone (ROCEPHIN) 1 g in sodium chloride 0.9 % 100 mL IVPB (0 g Intravenous Stopped 04/14/22 0315)  iohexol (OMNIPAQUE) 300 MG/ML solution 100 mL (60 mLs Intravenous Contrast Given 04/14/22 0330)  lactated ringers bolus 1,000 mL (0 mLs Intravenous Stopped 04/14/22 0532)  lactated ringers bolus 1,000 mL (1,000 mLs Intravenous New Bag/Given 04/14/22 0535)    ED Course/ Medical Decision Making/ A&P                             Medical Decision Making Amount and/or Complexity of Data Reviewed Labs: ordered. Radiology: ordered. ECG/medicine tests: ordered.  Risk  Prescription drug management. Decision regarding hospitalization.   72 year old male here with UTI.  On my interpretation of the CT scan of his abdomen it does look like he has pyelonephritis.  I was also a bit worried about the amount of hydronephrosis but according to radiology this is more chronic in nature.  Head CT shows some opacity in the frontal lobe that radiology states is consistent with a slowly enlarging meningioma.  Patient was given Rocephin.  At some soft pressures so was given multiple fluid boluses which improved his pressures to have a maps above 65 for quite a while.  His mental status remained the same.  Felt the patient is likely stable for admission to stepdown.  Is on LR infusion at this time. Discussed with Dr. Claria Dice for admission.   Final Clinical Impression(s) / ED Diagnoses Final diagnoses:  Sepsis, due to unspecified  organism, unspecified whether acute organ dysfunction present [facility])  Pyelonephritis    Rx / DC Orders ED Discharge Orders     None         Analynn Daum, Corene Cornea, MD 04/14/22 [phone]

## 2022-04-14 NOTE — Progress Notes (Signed)
This is a 72 year old male with past medical history of chronic pancreatitis, avascular porosis of the left hip, hypertension, seizure, CKD, throat and mouth cancer s/p appendectomy, alcoholic abuse. Patient presents to [facility] with 4 to 5 days of general malaise and decreased appetite.  Today he became febrile Tmax in the ER 103.2.  Patient initially hypotensive on presentation 80/64, tachycardic up to 125.  Urine positive for UTI.  CT positive right-sided pyelonephritis.  Patient met sepsis criteria.  The patient remains hypotensive after 2.5 L LR boluses.  Current blood pressure 91/62.  [name] mentation is normal.  Blood and urine cultures pending.    Additionally patient complained of neck pain CT neck shows IMPRESSION: 1. Chronic laryngectomy with tracheal stoma. Questionable mild increased tracheal wall thickening since 2016 and 2021, consider Tracheitis. But no other inflammatory process is evident in the neck; no fluid collection or abscess.   2. New 7 mm right upper lobe pulmonary nodule since 2016. Non-contrast chest CT at 6-12 months is recommended.     CT abdomen and pelvis IMPRESSION: 1. Right perinephric stranding with questionable patchy hypoenhancement in the right kidney which could be due to pyelonephritis, or artifact from the patient's arms and respiratory motion. 2. Chronic moderate to severe right hydronephrosis and mild right ureterectasis with no ureteral or intrarenal stone. The degree of hydronephrosis and ureterectasis seems unchanged. 3. Diffuse thickening of the bladder wall, greater to the right than the left, not seen previously. This could be due to hypertrophy or cystitis. 4. Prostatomegaly. 5. Chronic calcific pancreatitis. No acute inflammation is seen through the breathing motion. 6. Cholelithiasis. 7. Aortic and coronary artery atherosclerosis. 8. Minimal ascites. 9. Cachexia. 10. Chronic avascular necrosis left femoral  head.   Patient given IV Rocephin.  This has been continued.  IV fluid hydration has been continued.  Transferring to the progressive unit

## 2022-04-14 NOTE — Sepsis Progress Note (Signed)
Elink monitoring for the code sepsis protocol.  

## 2022-04-14 NOTE — ED Notes (Signed)
Dr Dayna Barker to bedside at this time to re-eval patient

## 2022-04-14 NOTE — ED Notes (Signed)
Pt resting with eyes closed; RR even and unlabored on RA with symmetrical rise and fall of chest.  BP normotensive at this time -- see vitals flowsheet.  Continuous cardiac and pulse ox monitoring maintained. Will monitor for acute changes and maintain plan of care as pt awaits bed assignment.

## 2022-04-14 NOTE — ED Triage Notes (Signed)
  Patient comes in with fever and generalized weakness that started yesterday.  Daughter reports low grade fever earlier in the day that got as high as 103.  Cough, chills, and fatigue at home.  Tylenol cold/flu given at 2000 last night.  No complaints of pain other than body aches.

## 2022-04-14 NOTE — ED Notes (Signed)
Late entry-- Pt has returned from [place]; resting quietly with family members at bedside

## 2022-04-14 NOTE — ED Notes (Signed)
Lung sounds to lower bases clear but diminished with auscultation.  S1 and S2 regular with auscultation.   HR has improved to 80s s/p 2 liter LR boluses
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
## 2022-04-14 NOTE — ED Notes (Signed)
While at bedside pt observed to have intermittent coughing with clear mucous spraying via stoma with coughing episodes

## 2022-04-14 NOTE — ED Notes (Signed)
Dr Dayna Barker has returned to bedside --updated family on CT findings and plan for admission -- pt/family agreeable.  Per Dr. Dayna Barker pt to receive 3rd bolus and will determine if pressors required if no bp improvement

## 2022-04-14 NOTE — ED Notes (Signed)
Patient transported to CT via stretcher escorted by Rad Tech   

## 2022-04-14 NOTE — ED Notes (Signed)
Pt again hypotensive; lying quietly in bed resting with eyes closed - easily aroused with verbal or tactile stimulation.  Dr Dayna Barker updated on current blood pressures

## 2022-04-14 NOTE — ED Notes (Signed)
Charge nurse Mitzi Hansen, RN updating Dr. Lauretta Grill of updated vitals -- 2nd LR bolus continues to infuse @999ml$ /hr to 20G R FA - cardiac and pulse ox monitoring maintained.  Pt continues to rest quietly; does not report any complaints.

## 2022-04-14 NOTE — ED Notes (Signed)
Dr. Lauretta Grill notified via secure chat of current temp 103.27F oral -- also made provider aware staff only able to obtain one set of blood cultures

## 2022-04-15 DIAGNOSIS — A419 Sepsis, unspecified organism: Secondary | ICD-10-CM | POA: Diagnosis not present

## 2022-04-15 LAB — C-REACTIVE PROTEIN: CRP: 13.4 mg/dL — ABNORMAL HIGH (ref <1.0)

## 2022-04-15 LAB — COMPREHENSIVE METABOLIC PANEL
ALT: 21 U/L (ref 0–44)
AST: 31 U/L (ref 15–41)
Albumin: 2.2 g/dL — ABNORMAL LOW (ref 3.5–5.0)
Alkaline Phosphatase: 73 U/L (ref 38–126)
Anion gap: 9 (ref 5–15)
BUN: 22 mg/dL (ref 8–23)
CO2: 21 mmol/L — ABNORMAL LOW (ref 22–32)
Calcium: 7.7 mg/dL — ABNORMAL LOW (ref 8.9–10.3)
Chloride: 108 mmol/L (ref 98–111)
Creatinine, Ser: 1.31 mg/dL — ABNORMAL HIGH (ref 0.61–1.24)
GFR, Estimated: 59 mL/min — ABNORMAL LOW (ref >60)
Glucose, Bld: 95 mg/dL (ref 70–99)
Potassium: 3.7 mmol/L (ref 3.5–5.1)
Sodium: 138 mmol/L (ref 135–145)
Total Bilirubin: 1.1 mg/dL (ref 0.3–1.2)
Total Protein: 6.6 g/dL (ref 6.5–8.1)

## 2022-04-15 LAB — PHOSPHORUS: Phosphorus: 2.5 mg/dL (ref 2.5–4.6)

## 2022-04-15 LAB — CBC WITH DIFFERENTIAL/PLATELET
Abs Immature Granulocytes: 0.13 10*3/uL — ABNORMAL HIGH (ref 0.00–0.07)
Basophils Absolute: 0 10*3/uL (ref 0.0–0.1)
Basophils Relative: 0 %
Eosinophils Absolute: 0.2 10*3/uL (ref 0.0–0.5)
Eosinophils Relative: 1 %
HCT: 25.2 % — ABNORMAL LOW (ref 39.0–52.0)
Hemoglobin: 8 g/dL — ABNORMAL LOW (ref 13.0–17.0)
Immature Granulocytes: 1 %
Lymphocytes Relative: 10 %
Lymphs Abs: 1.5 10*3/uL (ref 0.7–4.0)
MCH: 25.7 pg — ABNORMAL LOW (ref 26.0–34.0)
MCHC: 31.7 g/dL (ref 30.0–36.0)
MCV: 81 fL (ref 80.0–100.0)
Monocytes Absolute: 1.5 10*3/uL — ABNORMAL HIGH (ref 0.1–1.0)
Monocytes Relative: 11 %
Neutro Abs: 11.1 10*3/uL — ABNORMAL HIGH (ref 1.7–7.7)
Neutrophils Relative %: 77 %
Platelets: 185 10*3/uL (ref 150–400)
RBC: 3.11 MIL/uL — ABNORMAL LOW (ref 4.22–5.81)
RDW: 17.3 % — ABNORMAL HIGH (ref 11.5–15.5)
WBC: 14.4 10*3/uL — ABNORMAL HIGH (ref 4.0–10.5)
nRBC: 0 % (ref 0.0–0.2)

## 2022-04-15 LAB — HEMOGLOBIN A1C
Hgb A1c MFr Bld: 5.5 % (ref 4.8–5.6)
Mean Plasma Glucose: 111.15 mg/dL

## 2022-04-15 LAB — MAGNESIUM: Magnesium: 2 mg/dL (ref 1.7–2.4)

## 2022-04-15 MED ORDER — LACTATED RINGERS IV SOLN: INTRAVENOUS | Status: DC

## 2022-04-15 MED ORDER — TRAMADOL HCL 50 MG PO TABS
25.0000 mg | ORAL_TABLET | Freq: Four times a day (QID) | ORAL | Status: DC | PRN
  Administered 2022-04-15 – 2022-04-16 (×2): 25 mg via ORAL
  Filled 2022-04-15 (×2): qty 1

## 2022-04-15 NOTE — Progress Notes (Signed)
Mobility Specialist - Progress Note   04/15/22 1405  Mobility  Activity Ambulated with assistance in hallway  Level of Assistance Standby assist, set-up cues, supervision of patient - no hands on  Assistive Device Front wheel walker  Distance Ambulated (ft) 250 ft  Range of Motion/Exercises Active  Activity Response Tolerated well  Mobility Referral Yes  $Mobility charge 1 Mobility   Pt was found in bed with RN in room and agreeable to ambulate. Had no complaints during session and at EOS returned to bed with all necessities in reach.  Ferd Hibbs Mobility Specialist

## 2022-04-15 NOTE — Progress Notes (Signed)
PROGRESS NOTE    [name]  [id number] DOB: 05-Dec-1950 DOA: 04/14/2022 PCP: Rise Patience, DO   Brief Narrative: 72 year old with past medical history significant for laryngeal cancer in remission who presents with fever, poor appetite, not feeling well for the last week, Tmax 103.  He was found to have UTI, hypotensive that responded to IV fluids.  CT finding consistent with right side perinephric stranding/pyelonephritis, chronic Hydronephrosis.      Assessment & Plan:   Principal Problem:   UTI (urinary tract infection) Active Problems:   Sepsis ([place])   1-Sepsis secondary to right thigh pyelonephritis; Presents with fever, leukocytosis, tachycardia -UA with 21-50 WBC.  -Follow Urine culture -Follow Blood culture.  IV zosyn WBC trending down.   Chronic moderate to severe right hydronephrosis and mild right ureterectasis ; -No ureteral or intrarenal stones by CT. -Degree of hydronephrosis and ureterectasis seems unchanged -plan to monitor for clinical improvement. If fever persist or sign of infection persist will need formal urology consultation.  -he will need follow up with urology out patient for cystoscopy and Prostatomegaly.   History of laryngeal cancer; CT neck: Chronic laryngectomy with tracheal stoma.  Questionable mild tracheitis. -SP chemo and radiation. On remission.   Acute hyponatremia: continue with IV fluids.   CKD stage IIIa; Cr on admission 1.3 AKI ruled out.  Prior cr per recods 1.3--1,4  Hypothyroidism: Continue with Synthroid.   Chronic avascular necrosis left femoral head by CT Follow up out patient.   Prostatomegaly: will need follow up with urology   New 7 mm right upper lobe pulmonary nodule since 2016 -Need follow-up noncontrast CT chest 6 to 12 months.  Occluded left internal carotid artery since 2016.   Slowly enlarging 2 cm intracranial planum sphenoidale Meningioma. No evidence of adjacent cerebral edema. FU  out patient.   Estimated body mass index is 14.64 kg/m as calculated from the following:   Height as of this encounter: 5' 11"$  (1.803 m).   Weight as of this encounter: 47.6 kg.   DVT prophylaxis: heparin  Code Status: full code Family Communication: Care discussed with patient.  Disposition Plan:  Status is: Inpatient Remains inpatient appropriate because: management of infection.     Consultants:  Urology phone  Procedures:  none  Antimicrobials:    Subjective: He complaint of chronic ear pain, he has seen his ENT for that.  Denies abdominal pain.   Objective: Vitals:   04/14/22 1320 04/14/22 2028 04/14/22 2352 04/15/22 0453  BP: 91/61 (!) 140/84 124/77 122/76  Pulse: 92 99 92 92  Resp: 18 17 20 20  $ Temp: 98.1 F (36.7 C) 99.2 F (37.3 C) 98.9 F (37.2 C) 99.5 F (37.5 C)  TempSrc: Oral Oral Oral Oral  SpO2: 100% 100% 99% 98%  Weight:      Height:        Intake/Output Summary (Last 24 hours) at 04/15/2022 0749 Last data filed at 04/15/2022 0600 Gross per 24 hour  Intake 1931.38 ml  Output 1650 ml  Net 281.38 ml   Filed Weights   04/14/22 0031  Weight: 47.6 kg    Examination:  General exam: Appears calm and comfortable  Respiratory system: Clear to auscultation. Respiratory effort normal. Stoma in neck Cardiovascular system: S1 & S2 heard, RRR.  Gastrointestinal system: Abdomen is nondistended, soft and nontender. No organomegaly or masses felt. Normal bowel sounds heard. Central nervous system: Alert  Extremities: Symmetric 5 x 5 power.    Data Reviewed: I have personally reviewed following  labs and imaging studies  CBC: Recent Labs  Lab 04/14/22 0117  WBC 12.4*  NEUTROABS 9.5*  HGB 9.5*  HCT 29.4*  MCV 79.7*  PLT [id number]   Basic Metabolic Panel: Recent Labs  Lab 04/14/22 0117 04/15/22 0440  NA 133* 138  K 3.5 3.7  CL 98 108  CO2 22 21*  GLUCOSE 97 95  BUN 33* 22  CREATININE 1.39* 1.31*  CALCIUM 9.4 7.7*  MG  --  2.0  PHOS   --  2.5   GFR: Estimated Creatinine Clearance: 35.8 mL/min (A) (by C-G formula based on SCr of 1.31 mg/dL (H)). Liver Function Tests: Recent Labs  Lab 04/14/22 0117 04/15/22 0440  AST 18 31  ALT 14 21  ALKPHOS 82 73  BILITOT 1.6* 1.1  PROT 8.4* 6.6  ALBUMIN 3.7 2.2*   No results for input(s): "LIPASE", "AMYLASE" in the last 168 hours. No results for input(s): "AMMONIA" in the last 168 hours. Coagulation Profile: Recent Labs  Lab 04/14/22 0117  INR 1.3*   Cardiac Enzymes: No results for input(s): "CKTOTAL", "CKMB", "CKMBINDEX", "TROPONINI" in the last 168 hours. BNP (last 3 results) No results for input(s): "PROBNP" in the last 8760 hours. HbA1C: No results for input(s): "HGBA1C" in the last 72 hours. CBG: No results for input(s): "GLUCAP" in the last 168 hours. Lipid Profile: No results for input(s): "CHOL", "HDL", "LDLCALC", "TRIG", "CHOLHDL", "LDLDIRECT" in the last 72 hours. Thyroid Function Tests: No results for input(s): "TSH", "T4TOTAL", "FREET4", "T3FREE", "THYROIDAB" in the last 72 hours. Anemia Panel: No results for input(s): "VITAMINB12", "FOLATE", "FERRITIN", "TIBC", "IRON", "RETICCTPCT" in the last 72 hours. Sepsis Labs: Recent Labs  Lab 04/14/22 0117  LATICACIDVEN 1.8    Recent Results (from the past 240 hour(s))  Resp panel by RT-PCR (RSV, Flu A&B, Covid) Anterior Nasal Swab     Status: None   Collection Time: 04/14/22  1:17 AM   Specimen: Anterior Nasal Swab  Result Value Ref Range Status   SARS Coronavirus 2 by RT PCR NEGATIVE NEGATIVE Final    Comment: (NOTE) SARS-CoV-2 target nucleic acids are NOT DETECTED.  The SARS-CoV-2 RNA is generally detectable in upper respiratory specimens during the acute phase of infection. The lowest concentration of SARS-CoV-2 viral copies this assay can detect is 138 copies/mL. A negative result does not preclude SARS-Cov-2 infection and should not be used as the sole basis for treatment or other patient  management decisions. A negative result may occur with  improper specimen collection/handling, submission of specimen other than nasopharyngeal swab, presence of viral mutation(s) within the areas targeted by this assay, and inadequate number of viral copies(<138 copies/mL). A negative result must be combined with clinical observations, patient history, and epidemiological information. The expected result is Negative.  Fact Sheet for Patients:  EntrepreneurPulse.com.au  Fact Sheet for Healthcare Providers:  IncredibleEmployment.be  This test is no t yet approved or cleared by the [place] FDA and  has been authorized for detection and/or diagnosis of SARS-CoV-2 by FDA under an Emergency Use Authorization (EUA). This EUA will remain  in effect (meaning this test can be used) for the duration of the COVID-19 declaration under Section 564(b)(1) of the Act, 21 U.S.C.section 360bbb-3(b)(1), unless the authorization is terminated  or revoked sooner.       Influenza A by PCR NEGATIVE NEGATIVE Final   Influenza B by PCR NEGATIVE NEGATIVE Final    Comment: (NOTE) The Xpert Xpress SARS-CoV-2/FLU/RSV plus assay is intended as an aid in the diagnosis  of influenza from Nasopharyngeal swab specimens and should not be used as a sole basis for treatment. Nasal washings and aspirates are unacceptable for Xpert Xpress SARS-CoV-2/FLU/RSV testing.  Fact Sheet for Patients: EntrepreneurPulse.com.au  Fact Sheet for Healthcare Providers: IncredibleEmployment.be  This test is not yet approved or cleared by the [place] FDA and has been authorized for detection and/or diagnosis of SARS-CoV-2 by FDA under an Emergency Use Authorization (EUA). This EUA will remain in effect (meaning this test can be used) for the duration of the COVID-19 declaration under Section 564(b)(1) of the Act, 21 U.S.C. section 360bbb-3(b)(1),  unless the authorization is terminated or revoked.     Resp Syncytial Virus by PCR NEGATIVE NEGATIVE Final    Comment: (NOTE) Fact Sheet for Patients: EntrepreneurPulse.com.au  Fact Sheet for Healthcare Providers: IncredibleEmployment.be  This test is not yet approved or cleared by the [place] FDA and has been authorized for detection and/or diagnosis of SARS-CoV-2 by FDA under an Emergency Use Authorization (EUA). This EUA will remain in effect (meaning this test can be used) for the duration of the COVID-19 declaration under Section 564(b)(1) of the Act, 21 U.S.C. section 360bbb-3(b)(1), unless the authorization is terminated or revoked.  Performed at [facility], [address] [place], [place] [address]   Urine Culture     Status: None (Preliminary result)   Collection Time: 04/14/22  1:38 AM   Specimen: Urine, Clean Catch  Result Value Ref Range Status   Specimen Description   Final    URINE, CLEAN CATCH Performed at [facility], [place] [address], [place], [place] [address]    Special Requests   Final    NONE Reflexed from [id number] Performed at [facility], [address], [place], [place] [address]    Culture PENDING  Incomplete   Report Status PENDING  Incomplete         Radiology Studies: CT ABDOMEN PELVIS W CONTRAST  Result Date: 04/14/2022 CLINICAL DATA:  Sepsis. EXAM: CT ABDOMEN AND PELVIS WITH CONTRAST TECHNIQUE: Multidetector CT imaging of the abdomen and pelvis was performed using the standard protocol following bolus administration of intravenous contrast. RADIATION DOSE REDUCTION: This exam was performed according to the departmental dose-optimization program which includes automated exposure control, adjustment of the mA and/or kV according to patient size and/or use of iterative reconstruction technique. CONTRAST:  58m OMNIPAQUE IOHEXOL 300 MG/ML  SOLN COMPARISON:   CTs with IV contrast 07/30/2014 and 11/14/2010. FINDINGS: Lower chest: There is posterior atelectasis in the lower lobes, calcified granuloma laterally in the left lower lobe. Lung bases are clear of infiltrates. The cardiac size is normal. There are scattered calcifications in the coronary arteries, patchy calcification in the descending aorta, no pericardial effusion. Hepatobiliary: Respiratory motion limits evaluation. There is no obvious mass. There are small stones in the gallbladder but no wall thickening, no biliary dilatation. Pancreas: Coarse calcifications are again seen in the head and uncinate process consistent with chronic calcific pancreatitis. There is slight dilatation of the pancreatic duct which were noted previously and seems unchanged. There are no inflammatory changes. No masses seen through the motion artifact. Spleen: No abnormality is seen through the breathing artifacts. Adrenals/Urinary Tract: There is no adrenal mass. No mass enhancement in either kidney. There is questionable patchy hypoenhancement in the right kidney which could be exaggerated due to artifact from the patient's arms and due to respiratory motion or could indicate pyelonephritis. There is asymmetric right perinephric stranding. Moderately dilated left renal pelvis  is chronically noted and unchanged without intrarenal caliectasis likely reflecting as extrarenal pelvis. 8 mm nonobstructive caliceal stone inferior pole right kidney appear similar. No other nephrolithiasis is seen. Moderate to severe right hydronephrosis is similar to prior studies as well as mild ureterectasis, with no ureteral stone being seen. There is diffuse thickening of the bladder wall, greater to the right than the left. No focal masses seen but portions of the inferior bladder are obscured by spray artifact from a right hip arthroplasty. Stomach/Bowel: No dilatation or wall thickening. Uncomplicated left-sided diverticulosis. An appendix is not  seen in this patient. Vascular/Lymphatic: There is heavy aortoiliac calcific plaque with branch vessel atherosclerosis. No AAA. No adenopathy is seen. Reproductive: Prostatomegaly with transverse diameter 4.8 cm. Other: There is minimal fluid around the posterior right kidney. There is minimal abdominal and pelvic ascites. No free hemorrhage or free air is seen. There has been interval weight loss with decreasing thickness of body fat consistent with cachexia. There are no incarcerated hernias. Musculoskeletal: As above, a right hip arthroplasty is noted with changes of chronic avascular necrosis across the superior left femoral head and mild secondary DJD. No significant articular surface subsidence is seen. There is osteopenia and mild degenerative changes of the lumbar spine. No destructive bone lesion is seen. IMPRESSION: 1. Right perinephric stranding with questionable patchy hypoenhancement in the right kidney which could be due to pyelonephritis, or artifact from the patient's arms and respiratory motion. 2. Chronic moderate to severe right hydronephrosis and mild right ureterectasis with no ureteral or intrarenal stone. The degree of hydronephrosis and ureterectasis seems unchanged. 3. Diffuse thickening of the bladder wall, greater to the right than the left, not seen previously. This could be due to hypertrophy or cystitis. 4. Prostatomegaly. 5. Chronic calcific pancreatitis. No acute inflammation is seen through the breathing motion. 6. Cholelithiasis. 7. Aortic and coronary artery atherosclerosis. 8. Minimal ascites. 9. Cachexia. 10. Chronic avascular necrosis left femoral head. Aortic Atherosclerosis (ICD10-I70.0). Electronically Signed   By: Telford Nab M.D.   On: 04/14/2022 05:14   CT Soft Tissue Neck W Contrast  Result Date: 04/14/2022 CLINICAL DATA:  72 year old male with a history of treated head and neck cancer. Fever and weakness. Cough, chills. Query stoma abscess. EXAM: CT NECK WITH  CONTRAST TECHNIQUE: Multidetector CT imaging of the neck was performed using the standard protocol following the bolus administration of intravenous contrast. RADIATION DOSE REDUCTION: This exam was performed according to the departmental dose-optimization program which includes automated exposure control, adjustment of the mA and/or kV according to patient size and/or use of iterative reconstruction technique. CONTRAST:  43m OMNIPAQUE IOHEXOL 300 MG/ML  SOLN COMPARISON:  The most recent post treatment Neck CT is 04/09/2014. however, comparison is also made to noncontrast cervical spine CT 07/20/2019. FINDINGS: Pharynx and larynx: Chronic laryngectomy with tracheal stoma. Parapharyngeal and retropharyngeal postoperative architectural distortion appears stable since 2016, with multiple chronic bilateral parapharyngeal surgical clips. The nasopharynx contour is normal. The oropharynx is mostly effaced, but appearance is similar to 2016. Mildly gas-filled cervical esophagus. No discrete mass or fluid collection at the stoma site, although the tracheal walls there appear mildly more thickened than in 2016. There is a small stoma tube connecting the trachea and esophagus at the thoracic inlet, new from 2016. Salivary glands: Unchanged post treatment or other chronic submandibular gland atrophy. Sublingual space is stable and within normal limits. Parotid glands are within normal limits. Thyroid: Chronically diminutive. Satisfactory post tracheal stoma appearance. Lymph nodes: Sequelae of bilateral  neck dissection. No lymphadenopathy identified. Surgical clip configuration appears stable since 2016. Vascular: Bilateral internal jugular veins remain in place. Bilateral IJ appear to remain patent, the right is dominant. Right carotid arteries remain patent to the skull base. However, left internal carotid artery has occluded since 2016, with no reconstitution in the neck or at the skull base. Vertebral arteries remain  patent, although the right is now atherosclerotic and diminutive. Limited intracranial: Calcified atherosclerosis at the skull base. Evidence of reconstituted enhancement at the left ICA terminus. Chronic round roughly 2 cm enhancing planum sphenoidale mass was 16 mm in 2016 compatible with slowly enlarging meningioma. No evidence of inferior frontal lobe edema or intracranial mass effect. Visualized orbits: Negative. Mastoids and visualized paranasal sinuses: Paranasal sinus aeration is stable to improved since 2016. Tympanic cavities and mastoids are clear. Skeleton: Chronically absent dentition. Chronic cervical spine degeneration. Stable mild chronic T1 anterior compression. No acute or suspicious osseous lesion identified. Upper chest: Trachea remains patent although with mild increased circumferential wall thickening when compared to 2016. There is a new lobulated 7 mm right upper lobe pulmonary nodule since 2016 on series 3, image 100. This was not included in 2021. Otherwise the upper lungs appear stable. Calcified aortic atherosclerosis. No inflammation is evident in the visible superior mediastinum, although only the pre-vascular space is included. The visible thoracic esophagus is decompressed. IMPRESSION: 1. Chronic laryngectomy with tracheal stoma. Questionable mild increased tracheal wall thickening since 2016 and 2021, consider Tracheitis. But no other inflammatory process is evident in the neck; no fluid collection or abscess. 2. New 7 mm right upper lobe pulmonary nodule since 2016. Non-contrast chest CT at 6-12 months is recommended. If the nodule is stable at time of repeat CT, then future CT at 18-24 months (from today's scan) is considered optional for low-risk patients, but is recommended for high-risk patients. This recommendation follows the consensus statement: Guidelines for Management of Incidental Pulmonary Nodules Detected on CT Images: From the Fleischner Society 2017; Radiology 2017;  284:228-243. 3. Occluded left internal carotid artery since 2016. 4. Slowly enlarging 2 cm intracranial planum sphenoidale Meningioma. No evidence of adjacent cerebral edema. 5.  Aortic Atherosclerosis (ICD10-I70.0). Electronically Signed   By: Genevie Ann M.D.   On: 04/14/2022 04:31   DG Chest Port 1 View  Result Date: 04/14/2022 CLINICAL DATA:  Questionable sepsis with fever and generalized weakness. Coughing and chills as well. EXAM: PORTABLE CHEST 1 VIEW COMPARISON:  AP Lat 07/04/2016 FINDINGS: The lungs are mildly emphysematous but clear.  The sulci are sharp. Heart size and vasculature are normal apart from mild aortic tortuosity with patchy calcific plaques. Stable mediastinum. The sulci are sharp. Single surgical clip again noted medially right chest apex. Osteopenia. IMPRESSION: No evidence of acute chest process. COPD. Aortic atherosclerosis. Electronically Signed   By: Telford Nab M.D.   On: 04/14/2022 00:52        Scheduled Meds:  heparin injection (subcutaneous)  5,000 Units Subcutaneous Q8H   levothyroxine  137 mcg Oral Q0600   Continuous Infusions:  0.9 % NaCl with KCl 20 mEq / L 100 mL/hr at 04/15/22 0214   piperacillin-tazobactam (ZOSYN)  IV 3.375 g (04/15/22 0216)     LOS: 1 day    Time spent: 35 minutes    Sahand Gosch A Colum Colt, MD [facility]   If 7PM-7AM, please contact night-coverage [email or web address]  04/15/2022, 7:49 AM

## 2022-04-16 DIAGNOSIS — A419 Sepsis, unspecified organism: Secondary | ICD-10-CM | POA: Diagnosis not present

## 2022-04-16 LAB — CBC
HCT: 28 % — ABNORMAL LOW (ref 39.0–52.0)
Hemoglobin: 8.6 g/dL — ABNORMAL LOW (ref 13.0–17.0)
MCH: 25.7 pg — ABNORMAL LOW (ref 26.0–34.0)
MCHC: 30.7 g/dL (ref 30.0–36.0)
MCV: 83.6 fL (ref 80.0–100.0)
Platelets: 207 10*3/uL (ref 150–400)
RBC: 3.35 MIL/uL — ABNORMAL LOW (ref 4.22–5.81)
RDW: 17.3 % — ABNORMAL HIGH (ref 11.5–15.5)
WBC: 9.3 10*3/uL (ref 4.0–10.5)
nRBC: 0 % (ref 0.0–0.2)

## 2022-04-16 LAB — FERRITIN: Ferritin: 374 ng/mL — ABNORMAL HIGH (ref 24–336)

## 2022-04-16 LAB — LIPID PANEL
Cholesterol: 121 mg/dL (ref 0–200)
HDL: 18 mg/dL — ABNORMAL LOW (ref >40)
LDL Cholesterol: 88 mg/dL (ref 0–99)
Total CHOL/HDL Ratio: 6.7 RATIO
Triglycerides: 76 mg/dL (ref <150)
VLDL: 15 mg/dL (ref 0–40)

## 2022-04-16 LAB — IRON AND TIBC
Iron: 54 ug/dL (ref 45–182)
Saturation Ratios: 36 % (ref 17.9–39.5)
TIBC: 150 ug/dL — ABNORMAL LOW (ref 250–450)
UIBC: 96 ug/dL

## 2022-04-16 LAB — RETICULOCYTES
Immature Retic Fract: 14.8 % (ref 2.3–15.9)
RBC.: 3.17 MIL/uL — ABNORMAL LOW (ref 4.22–5.81)
Retic Count, Absolute: 34.6 10*3/uL (ref 19.0–186.0)
Retic Ct Pct: 1.1 % (ref 0.4–3.1)

## 2022-04-16 LAB — BASIC METABOLIC PANEL
Anion gap: 9 (ref 5–15)
BUN: 11 mg/dL (ref 8–23)
CO2: 22 mmol/L (ref 22–32)
Calcium: 7.8 mg/dL — ABNORMAL LOW (ref 8.9–10.3)
Chloride: 106 mmol/L (ref 98–111)
Creatinine, Ser: 1.12 mg/dL (ref 0.61–1.24)
GFR, Estimated: >60 mL/min (ref >60)
Glucose, Bld: 90 mg/dL (ref 70–99)
Potassium: 3.6 mmol/L (ref 3.5–5.1)
Sodium: 137 mmol/L (ref 135–145)

## 2022-04-16 LAB — VITAMIN B12: Vitamin B-12: 211 pg/mL (ref 180–914)

## 2022-04-16 LAB — FOLATE: Folate: 7.3 ng/mL (ref >5.9)

## 2022-04-16 MED ORDER — OMEGA-3-ACID ETHYL ESTERS 1 G PO CAPS
1.0000 g | ORAL_CAPSULE | Freq: Two times a day (BID) | ORAL | Status: DC
  Administered 2022-04-16 – 2022-04-18 (×5): 1 g via ORAL
  Filled 2022-04-16 (×5): qty 1

## 2022-04-16 MED ORDER — DICLOFENAC SODIUM 1 % EX GEL
2.0000 g | Freq: Four times a day (QID) | CUTANEOUS | Status: DC
  Administered 2022-04-16 – 2022-04-18 (×7): 2 g via TOPICAL
  Filled 2022-04-16 (×2): qty 100

## 2022-04-16 MED ORDER — CYANOCOBALAMIN 1000 MCG/ML IJ SOLN
1000.0000 ug | Freq: Every day | INTRAMUSCULAR | Status: DC
  Administered 2022-04-16 – 2022-04-18 (×3): 1000 ug via INTRAMUSCULAR
  Filled 2022-04-16 (×3): qty 1

## 2022-04-16 NOTE — Progress Notes (Addendum)
PROGRESS NOTE    [name]  [id number] DOB: 03-27-1950 DOA: 04/14/2022 PCP: Rise Patience, DO   Brief Narrative: 72 year old with past medical history significant for laryngeal cancer in remission who presents with fever, poor appetite, not feeling well for the last week, Tmax 103.  He was found to have UTI, hypotensive that responded to IV fluids.  CT finding consistent with right side perinephric stranding/pyelonephritis, chronic Hydronephrosis.      Assessment & Plan:   Principal Problem:   UTI (urinary tract infection) Active Problems:   Sepsis ([place])   1-Sepsis secondary to right thigh pyelonephritis; Presents with fever, leukocytosis, tachycardia -UA with 21-50 WBC.  -Follow Urine culture: 100,000 Aerococcus species, 40,000 enterococcus faecalis.  -Follow Blood culture. No growth to date  IV zosyn WBC trending down.   Chronic moderate to severe right hydronephrosis and mild right ureterectasis ; -No ureteral or intrarenal stones by CT. -Degree of hydronephrosis and ureterectasis seems unchanged -plan to monitor for clinical improvement. If fever persist or sign of infection persist will need formal urology consultation.  -he will need follow up with urology out patient for cystoscopy and Prostatomegaly.  Improving clinically, no further fever.  Monitor post void residual , if retain more than 300 will need foley  History of laryngeal cancer; CT neck: Chronic laryngectomy with tracheal stoma.  Questionable mild tracheitis. -SP chemo and radiation. On remission.   Acute hyponatremia: Continue with IV fluids.   Anemia; of chronic diseases.  B 12 low, start supplement injection.   CKD stage IIIa; Cr on admission 1.3 AKI ruled out.  Prior cr per recods 1.3--1,4  Hypothyroidism: Continue with Synthroid.   Chronic avascular necrosis left femoral head by CT Follow up out patient.   Prostatomegaly: will need follow up with urology   New 7 mm  right upper lobe pulmonary nodule since 2016 -Need follow-up noncontrast CT chest 6 to 12 months.  Occluded left internal carotid artery since 2016. LDL 88 Needs follow up with vascular.  Balance problem FU neurology  Slowly enlarging 2 cm intracranial planum sphenoidale Meningioma. No evidence of adjacent cerebral edema. FU out patient.   Estimated body mass index is 14.64 kg/m as calculated from the following:   Height as of this encounter: 5' 11"$  (1.803 m).   Weight as of this encounter: 47.6 kg.   DVT prophylaxis: heparin  Code Status: full code Family Communication: Care discussed with patient. Daughter updated at bedside.  Disposition Plan:  Status is: Inpatient Remains inpatient appropriate because: management of infection.     Consultants:  Urology phone  Procedures:  none  Antimicrobials:    Subjective: He is feeling better.  He confirm he is full code  Objective: Vitals:   04/15/22 1310 04/15/22 2054 04/16/22 0510 04/16/22 1200  BP: (!) 148/79 (!) 143/88 (!) 147/91 (!) 148/88  Pulse:  73 75 72  Resp:  20 20 20  $ Temp:  98.4 F (36.9 C) 98.2 F (36.8 C) 98 F (36.7 C)  TempSrc:  Oral Oral Oral  SpO2:  100% 98% 100%  Weight:      Height:        Intake/Output Summary (Last 24 hours) at 04/16/2022 1537 Last data filed at 04/16/2022 1345 Gross per 24 hour  Intake 1499.57 ml  Output 1475 ml  Net 24.57 ml    Filed Weights   04/14/22 0031  Weight: 47.6 kg    Examination:  General exam: NAD Respiratory system: Stoma in neck, CTA Cardiovascular system: S  1, S 2 RRR Gastrointestinal system: BS present, soft, nt Central nervous system: Alert.  Extremities: no edema    Data Reviewed: I have personally reviewed following labs and imaging studies  CBC: Recent Labs  Lab 04/14/22 0117 04/15/22 0729 04/16/22 0801  WBC 12.4* 14.4* 9.3  NEUTROABS 9.5* 11.1*  --   HGB 9.5* 8.0* 8.6*  HCT 29.4* 25.2* 28.0*  MCV 79.7* 81.0 83.6  PLT 239 185  [id number]    Basic Metabolic Panel: Recent Labs  Lab 04/14/22 0117 04/15/22 0440 04/16/22 0801  NA 133* 138 137  K 3.5 3.7 3.6  CL 98 108 106  CO2 22 21* 22  GLUCOSE 97 95 90  BUN 33* 22 11  CREATININE 1.39* 1.31* 1.12  CALCIUM 9.4 7.7* 7.8*  MG  --  2.0  --   PHOS  --  2.5  --     GFR: Estimated Creatinine Clearance: 41.9 mL/min (by C-G formula based on SCr of 1.12 mg/dL). Liver Function Tests: Recent Labs  Lab 04/14/22 0117 04/15/22 0440  AST 18 31  ALT 14 21  ALKPHOS 82 73  BILITOT 1.6* 1.1  PROT 8.4* 6.6  ALBUMIN 3.7 2.2*    No results for input(s): "LIPASE", "AMYLASE" in the last 168 hours. No results for input(s): "AMMONIA" in the last 168 hours. Coagulation Profile: Recent Labs  Lab 04/14/22 0117  INR 1.3*    Cardiac Enzymes: No results for input(s): "CKTOTAL", "CKMB", "CKMBINDEX", "TROPONINI" in the last 168 hours. BNP (last 3 results) No results for input(s): "PROBNP" in the last 8760 hours. HbA1C: Recent Labs    04/15/22 0440  HGBA1C 5.5   CBG: No results for input(s): "GLUCAP" in the last 168 hours. Lipid Profile: Recent Labs    04/16/22 0417  CHOL 121  HDL 18*  LDLCALC 88  TRIG 76  CHOLHDL 6.7   Thyroid Function Tests: No results for input(s): "TSH", "T4TOTAL", "FREET4", "T3FREE", "THYROIDAB" in the last 72 hours. Anemia Panel: Recent Labs    04/16/22 0417  VITAMINB12 211  FOLATE 7.3  FERRITIN 374*  TIBC 150*  IRON 54  RETICCTPCT 1.1   Sepsis Labs: Recent Labs  Lab 04/14/22 0117  LATICACIDVEN 1.8     Recent Results (from the past 240 hour(s))  Blood Culture (routine x 2)     Status: None (Preliminary result)   Collection Time: 04/14/22  1:17 AM   Specimen: BLOOD  Result Value Ref Range Status   Specimen Description   Final    BLOOD BLOOD RIGHT ARM Performed at [facility], [address], [place], [place] [address]    Special Requests   Final    NONE Performed at [facility], [address] [place], [place] [address]    Culture   Final    NO GROWTH 2 DAYS Performed at [facility], [place] [address] [place], [place] [address]    Report Status PENDING  Incomplete  Resp panel by RT-PCR (RSV, Flu A&B, Covid) Anterior Nasal Swab     Status: None   Collection Time: 04/14/22  1:17 AM   Specimen: Anterior Nasal Swab  Result Value Ref Range Status   SARS Coronavirus 2 by RT PCR NEGATIVE NEGATIVE Final    Comment: (NOTE) SARS-CoV-2 target nucleic acids are NOT DETECTED.  The SARS-CoV-2 RNA is generally detectable in upper respiratory specimens during the acute phase of infection. The lowest concentration of SARS-CoV-2 viral copies this assay can detect is 138 copies/mL. A  negative result does not preclude SARS-Cov-2 infection and should not be used as the sole basis for treatment or other patient management decisions. A negative result may occur with  improper specimen collection/handling, submission of specimen other than nasopharyngeal swab, presence of viral mutation(s) within the areas targeted by this assay, and inadequate number of viral copies(<138 copies/mL). A negative result must be combined with clinical observations, patient history, and epidemiological information. The expected result is Negative.  Fact Sheet for Patients:  EntrepreneurPulse.com.au  Fact Sheet for Healthcare Providers:  IncredibleEmployment.be  This test is no t yet approved or cleared by the [place] FDA and  has been authorized for detection and/or diagnosis of SARS-CoV-2 by FDA under an Emergency Use Authorization (EUA). This EUA will remain  in effect (meaning this test can be used) for the duration of the COVID-19 declaration under Section 564(b)(1) of the Act, 21 U.S.C.section 360bbb-3(b)(1), unless the authorization is terminated  or revoked sooner.       Influenza A by PCR NEGATIVE NEGATIVE Final    Influenza B by PCR NEGATIVE NEGATIVE Final    Comment: (NOTE) The Xpert Xpress SARS-CoV-2/FLU/RSV plus assay is intended as an aid in the diagnosis of influenza from Nasopharyngeal swab specimens and should not be used as a sole basis for treatment. Nasal washings and aspirates are unacceptable for Xpert Xpress SARS-CoV-2/FLU/RSV testing.  Fact Sheet for Patients: EntrepreneurPulse.com.au  Fact Sheet for Healthcare Providers: IncredibleEmployment.be  This test is not yet approved or cleared by the [place] FDA and has been authorized for detection and/or diagnosis of SARS-CoV-2 by FDA under an Emergency Use Authorization (EUA). This EUA will remain in effect (meaning this test can be used) for the duration of the COVID-19 declaration under Section 564(b)(1) of the Act, 21 U.S.C. section 360bbb-3(b)(1), unless the authorization is terminated or revoked.     Resp Syncytial Virus by PCR NEGATIVE NEGATIVE Final    Comment: (NOTE) Fact Sheet for Patients: EntrepreneurPulse.com.au  Fact Sheet for Healthcare Providers: IncredibleEmployment.be  This test is not yet approved or cleared by the [place] FDA and has been authorized for detection and/or diagnosis of SARS-CoV-2 by FDA under an Emergency Use Authorization (EUA). This EUA will remain in effect (meaning this test can be used) for the duration of the COVID-19 declaration under Section 564(b)(1) of the Act, 21 U.S.C. section 360bbb-3(b)(1), unless the authorization is terminated or revoked.  Performed at [facility], [address] [place], [place] [address]   Urine Culture     Status: Abnormal (Preliminary result)   Collection Time: 04/14/22  1:38 AM   Specimen: Urine, Clean Catch  Result Value Ref Range Status   Specimen Description   Final    URINE, CLEAN CATCH Performed at [facility], [place] [address],  [place], [place] [address]    Special Requests   Final    NONE Reflexed from [id number] Performed at [facility], [address] [place], [place] [address]    Culture (A)  Final    >=100,000 COLONIES/mL AEROCOCCUS SPECIES 40,000 COLONIES/mL ENTEROCOCCUS FAECALIS Standardized susceptibility testing for this organism is not available. AEROCOCCUS SPECIES SUSCEPTIBILITIES TO FOLLOW ENTEROCOCCUS FAECALIS    Report Status PENDING  Incomplete         Radiology Studies: No results found.      Scheduled Meds:  cyanocobalamin  1,000 mcg Intramuscular Daily   heparin injection (subcutaneous)  5,000 Units Subcutaneous Q8H   levothyroxine  137 mcg Oral Q0600  omega-3 acid ethyl esters  1 g Oral BID   Continuous Infusions:  lactated ringers 75 mL/hr at 04/16/22 1256   piperacillin-tazobactam (ZOSYN)  IV 3.375 g (04/16/22 0927)     LOS: 2 days    Time spent: 35 minutes    Mariany Mackintosh A Lindzee Gouge, MD [facility]   If 7PM-7AM, please contact night-coverage [email or web address]  04/16/2022, 3:37 PM

## 2022-04-16 NOTE — Progress Notes (Signed)
Post void check at 6 pm, Bladder scanner shows 340 ml. MD aware, order placed for Foley and if needed coude. Tried several times with Designer, multimedia unable to place Foley. APP J. Olena Heckle on the night shift aware and informed. Will keep to monitor,

## 2022-04-16 NOTE — Evaluation (Signed)
Physical Therapy One Time Evaluation Patient Details Name: [name] MRN: [id number] DOB: 07-27-1950 Today's Date: 04/16/2022  History of Present Illness  72-year-old with who presents with fever, poor appetite, not feeling well for the last week, Tmax 103.  He was found to have UTI, hypotensive that responded to IV fluids.  CT finding consistent with right side perinephric stranding/pyelonephritis, chronic Hydronephrosis.   past medical history significant for laryngeal cancer in remission, seizure disorder, alcohol use, liver cirrhosis, CHF, HTN, HLD  Clinical Impression  Patient evaluated by Physical Therapy with no further acute PT needs identified. All education has been completed and the patient has no further questions.  Pt pleasant and agreeable to mobilize.  Pt able to ambulate around unit without assistive device.  Recommend pt continue mobilizing with staff during hospitalization. See below for any follow-up Physical Therapy or equipment needs. PT is signing off. Thank you for this referral.        Recommendations for follow up therapy are one component of a multi-disciplinary discharge planning process, led by the attending physician.  Recommendations may be updated based on patient status, additional functional criteria and insurance authorization.  Follow Up Recommendations No PT follow up      Assistance Recommended at Discharge PRN  Patient can return home with the following  Help with stairs or ramp for entrance    Equipment Recommendations None recommended by PT  Recommendations for Other Services       Functional Status Assessment Patient has not had a recent decline in their functional status     Precautions / Restrictions Precautions Precautions: Fall Restrictions Weight Bearing Restrictions: No      Mobility  Bed Mobility Overal bed mobility: Modified Independent                  Transfers Overall transfer level: Modified  independent                      Ambulation/Gait Ambulation/Gait assistance: Supervision, Modified independent (Device/Increase time) Gait Distance (Feet): 400 Feet Assistive device: None Gait Pattern/deviations: Step-through pattern, Decreased stride length, Wide base of support       General Gait Details: overall mildly unsteady at times however no true LOB or need for assist; pt reports ambulation at baseline, denies symptoms  Stairs            Wheelchair Mobility    Modified Rankin (Stroke Patients Only)       Balance Overall balance assessment: History of Falls                                           Pertinent Vitals/Pain Pain Assessment Pain Assessment: No/denies pain    Home Living Family/patient expects to be discharged to:: Private residence Living Arrangements: Children Available Help at Discharge: Family;Available PRN/intermittently Type of Home: House         Home Layout: One level Home Equipment: Conservation officer, nature (2 wheels);Cane - single point      Prior Function Prior Level of Function : Independent/Modified Independent                     Hand Dominance        Extremity/Trunk Assessment        Lower Extremity Assessment Lower Extremity Assessment: Overall WFL for tasks assessed    Cervical / Trunk Assessment  Cervical / Trunk Assessment: Normal  Communication   Communication: Other (comment) (hx  tracheal stoma, pt whispers and mouths words)  Cognition Arousal/Alertness: Awake/alert Behavior During Therapy: WFL for tasks assessed/performed Overall Cognitive Status: Within Functional Limits for tasks assessed                                          General Comments      Exercises     Assessment/Plan    PT Assessment Patient does not need any further PT services  PT Problem List         PT Treatment Interventions      PT Goals (Current goals can be found in the  Care Plan section)  Acute Rehab PT Goals PT Goal Formulation: All assessment and education complete, DC therapy    Frequency       Co-evaluation               AM-PAC PT "6 Clicks" Mobility  Outcome Measure Help needed turning from your back to your side while in a flat bed without using bedrails?: None Help needed moving from lying on your back to sitting on the side of a flat bed without using bedrails?: None Help needed moving to and from a bed to a chair (including a wheelchair)?: None Help needed standing up from a chair using your arms (e.g., wheelchair or bedside chair)?: A Little Help needed to walk in hospital room?: A Little Help needed climbing 3-5 steps with a railing? : A Little 6 Click Score: 21    End of Session   Activity Tolerance: Patient tolerated treatment well Patient left: in bed;with call bell/phone within reach Nurse Communication: Mobility status PT Visit Diagnosis: Difficulty in walking, not elsewhere classified (R26.2)    Time: [id number] PT Time Calculation (min) (ACUTE ONLY): 10 min   Charges:   PT Evaluation $PT Eval Low Complexity: 1 Low        Kati PT, DPT Physical Therapist Acute Rehabilitation Services Preferred contact method: Secure Chat Weekend Pager Only: [phone] Office: [phone]  Myrtis Hopping [place] 04/16/2022, 11:54 AM

## 2022-04-17 DIAGNOSIS — N12 Tubulo-interstitial nephritis, not specified as acute or chronic: Secondary | ICD-10-CM | POA: Diagnosis not present

## 2022-04-17 LAB — CBC
HCT: 26.1 % — ABNORMAL LOW (ref 39.0–52.0)
Hemoglobin: 8.2 g/dL — ABNORMAL LOW (ref 13.0–17.0)
MCH: 25.7 pg — ABNORMAL LOW (ref 26.0–34.0)
MCHC: 31.4 g/dL (ref 30.0–36.0)
MCV: 81.8 fL (ref 80.0–100.0)
Platelets: 228 10*3/uL (ref 150–400)
RBC: 3.19 MIL/uL — ABNORMAL LOW (ref 4.22–5.81)
RDW: 17.1 % — ABNORMAL HIGH (ref 11.5–15.5)
WBC: 6.7 10*3/uL (ref 4.0–10.5)
nRBC: 0 % (ref 0.0–0.2)

## 2022-04-17 LAB — BASIC METABOLIC PANEL
Anion gap: 8 (ref 5–15)
BUN: 8 mg/dL (ref 8–23)
CO2: 24 mmol/L (ref 22–32)
Calcium: 7.7 mg/dL — ABNORMAL LOW (ref 8.9–10.3)
Chloride: 105 mmol/L (ref 98–111)
Creatinine, Ser: 1.11 mg/dL (ref 0.61–1.24)
GFR, Estimated: >60 mL/min (ref >60)
Glucose, Bld: 84 mg/dL (ref 70–99)
Potassium: 3.2 mmol/L — ABNORMAL LOW (ref 3.5–5.1)
Sodium: 137 mmol/L (ref 135–145)

## 2022-04-17 LAB — URINE CULTURE: Culture: >=100000 — AB

## 2022-04-17 MED ORDER — AMOXICILLIN 250 MG PO CAPS
500.0000 mg | ORAL_CAPSULE | Freq: Three times a day (TID) | ORAL | Status: DC
  Administered 2022-04-17 – 2022-04-18 (×4): 500 mg via ORAL
  Filled 2022-04-17 (×4): qty 2

## 2022-04-17 MED ORDER — POTASSIUM CHLORIDE CRYS ER 20 MEQ PO TBCR
40.0000 meq | EXTENDED_RELEASE_TABLET | Freq: Once | ORAL | Status: AC
  Administered 2022-04-17: 40 meq via ORAL
  Filled 2022-04-17: qty 2

## 2022-04-17 NOTE — Care Management Important Message (Signed)
Important Message  Patient Details  Name: [name] MRN: [id number] Date of Birth: 1950/03/13   Medicare Important Message Given:  Yes     Memory Argue 04/17/2022, 4:23 PM

## 2022-04-17 NOTE — Evaluation (Signed)
Physical Therapy Re-Evaluation Patient Details Name: [name] MRN: [id number] DOB: 02-10-1951 Today's Date: 04/17/2022  History of Present Illness  72 year old with who presents with fever, poor appetite, not feeling well for the last week, Tmax 103.  He was found to have UTI, hypotensive that responded to IV fluids.  CT finding consistent with right side perinephric stranding/pyelonephritis, chronic Hydronephrosis.   past medical history significant for laryngeal cancer in remission, seizure disorder, alcohol use, liver cirrhosis, CHF, HTN, HLD  Clinical Impression  Patient evaluated by Physical Therapy with no further acute PT needs identified. All education has been completed and the patient has no further questions.  PT asked to reevaluate.  Pt mobilizing similar to yesterday.  Pt reports history of falls, some due to dizziness.  Pt denied symptoms with mobility today.  Pt could benefit from HHPT for home safety evaluation and consider if pt needs to start using assistive.  Pt would benefit from continuing to mobilize in hospital with staff.  See below for any follow-up Physical Therapy or equipment needs. PT is signing off. Thank you for this referral.        Recommendations for follow up therapy are one component of a multi-disciplinary discharge planning process, led by the attending physician.  Recommendations may be updated based on patient status, additional functional criteria and insurance authorization.  Follow Up Recommendations Home health PT      Assistance Recommended at Discharge PRN  Patient can return home with the following  Help with stairs or ramp for entrance    Equipment Recommendations None recommended by PT  Recommendations for Other Services       Functional Status Assessment Patient has had a recent decline in their functional status and demonstrates the ability to make significant improvements in function in a reasonable and predictable amount of  time.     Precautions / Restrictions Precautions Precautions: Fall      Mobility  Bed Mobility Overal bed mobility: Modified Independent                  Transfers Overall transfer level: Modified independent                      Ambulation/Gait Ambulation/Gait assistance: Modified independent (Device/Increase time) Gait Distance (Feet): 400 Feet Assistive device: None Gait Pattern/deviations: Step-through pattern, Decreased stride length, Wide base of support       General Gait Details: no LOB, increased bil hip external rotation and toe out present and gait appears similar to yesterday; pt denies symptoms  Stairs            Wheelchair Mobility    Modified Rankin (Stroke Patients Only)       Balance Overall balance assessment: History of Falls                                           Pertinent Vitals/Pain Pain Assessment Pain Assessment: No/denies pain    Home Living Family/patient expects to be discharged to:: Private residence Living Arrangements: Children Available Help at Discharge: Family;Available PRN/intermittently Type of Home: House         Home Layout: One level Home Equipment: Conservation officer, nature (2 wheels);Cane - single point      Prior Function Prior Level of Function : Independent/Modified Independent;History of Falls (last six months)  Hand Dominance        Extremity/Trunk Assessment        Lower Extremity Assessment Lower Extremity Assessment: Overall WFL for tasks assessed    Cervical / Trunk Assessment Cervical / Trunk Assessment: Normal  Communication   Communication: Other (comment) (hx tracheal stoma, pt whispers and mouths words)  Cognition Arousal/Alertness: Awake/alert Behavior During Therapy: WFL for tasks assessed/performed Overall Cognitive Status: Within Functional Limits for tasks assessed                                           General Comments      Exercises     Assessment/Plan    PT Assessment All further PT needs can be met in the next venue of care  PT Problem List Decreased balance;Decreased knowledge of use of DME       PT Treatment Interventions      PT Goals (Current goals can be found in the Care Plan section)  Acute Rehab PT Goals PT Goal Formulation: All assessment and education complete, DC therapy    Frequency       Co-evaluation               AM-PAC PT "6 Clicks" Mobility  Outcome Measure Help needed turning from your back to your side while in a flat bed without using bedrails?: None Help needed moving from lying on your back to sitting on the side of a flat bed without using bedrails?: None Help needed moving to and from a bed to a chair (including a wheelchair)?: None Help needed standing up from a chair using your arms (e.g., wheelchair or bedside chair)?: A Little Help needed to walk in hospital room?: A Little Help needed climbing 3-5 steps with a railing? : A Little 6 Click Score: 21    End of Session   Activity Tolerance: Patient tolerated treatment well Patient left: with call bell/phone within reach;in chair;with chair alarm set;with nursing/sitter in room Nurse Communication: Mobility status PT Visit Diagnosis: Difficulty in walking, not elsewhere classified (R26.2);History of falling (Z91.81)    Time: 1049-1101 PT Time Calculation (min) (ACUTE ONLY): 12 min   Charges:   PT Evaluation $PT Re-evaluation: 1 Re-eval         Jannette Spanner PT, DPT Physical Therapist Acute Rehabilitation Services Preferred contact method: Secure Chat Weekend Pager Only: [phone] Office: [place] 04/17/2022, 11:24 AM

## 2022-04-17 NOTE — Progress Notes (Signed)
PROGRESS NOTE    [name]  [id number] DOB: 03/01/1951 DOA: 04/14/2022 PCP: Rise Patience, DO   Brief Narrative: 72 year old with past medical history significant for laryngeal cancer in remission who presents with fever, poor appetite, not feeling well for the last week, Tmax 103.  He was found to have UTI, hypotensive that responded to IV fluids.  CT finding consistent with right side perinephric stranding/pyelonephritis, chronic Hydronephrosis.    Assessment & Plan:   Principal Problem:   UTI (urinary tract infection) Active Problems:   Sepsis ([place])   1-Sepsis secondary to right thigh pyelonephritis; Presents with fever, leukocytosis, tachycardia -UA with 21-50 WBC.  -Follow Urine culture: 100,000 Aerococcus species, 40,000 enterococcus faecalis.  -Follow Blood culture. No growth to date  Treated with IV zosyn--transition to Ampicillin.  WBC trending down.   Chronic moderate to severe right hydronephrosis and mild right ureterectasis ; -No ureteral or intrarenal stones by CT. -Degree of hydronephrosis and ureterectasis seems unchanged -plan to monitor for clinical improvement. If fever persist or sign of infection persist will need formal urology consultation.  -he will need follow up with urology out patient for cystoscopy and Prostatomegaly.  Improving clinically, no further fever.  -post void residual fluctuates 240--300 Urology consulted.   History of laryngeal cancer; CT neck: Chronic laryngectomy with tracheal stoma.  Questionable mild tracheitis. -SP chemo and radiation. On remission.   Acute hyponatremia: Treated  with IV fluids.   Anemia; of chronic diseases.  B 12 low, started supplement injection.   CKD stage IIIa; Cr on admission 1.3 AKI ruled out.  Prior cr per recods 1.3--1,4  Hypothyroidism: Continue with Synthroid.   Chronic avascular necrosis left femoral head by CT Follow up out patient.   Prostatomegaly: will need follow up  with urology   New 7 mm right upper lobe pulmonary nodule since 2016 -Need follow-up noncontrast CT chest 6 to 12 months.  Occluded left internal carotid artery since 2016. LDL 88 Needs follow up with vascular.  Balance problem FU neurology  Slowly enlarging 2 cm intracranial planum sphenoidale Meningioma. No evidence of adjacent cerebral edema. FU out patient.   Estimated body mass index is 14.64 kg/m as calculated from the following:   Height as of this encounter: 5' 11"$  (1.803 m).   Weight as of this encounter: 47.6 kg.   DVT prophylaxis: heparin  Code Status: full code Family Communication: Care discussed with patient. Daughter updated at bedside 2/11.  Disposition Plan:  Status is: Inpatient Remains inpatient appropriate because: management of infection. Home tomorrow     Consultants:  Urology phone  Procedures:  none  Antimicrobials:    Subjective: No new complaints.   Post void yesterday afternoon 300----today 240 Objective: Vitals:   04/16/22 1200 04/16/22 2227 04/17/22 0423 04/17/22 1250  BP: (!) 148/88 (!) 168/84 (!) 156/83 (!) 165/95  Pulse: 72 71 70 76  Resp: 20 17 17 20  $ Temp: 98 F (36.7 C) 98.7 F (37.1 C)  97.6 F (36.4 C)  TempSrc: Oral Oral Oral Axillary  SpO2: 100% 95% 97% 96%  Weight:      Height:        Intake/Output Summary (Last 24 hours) at 04/17/2022 1527 Last data filed at 04/17/2022 1520 Gross per 24 hour  Intake 1485 ml  Output 2900 ml  Net -1415 ml    Filed Weights   04/14/22 0031  Weight: 47.6 kg    Examination:  General exam: NAD Respiratory system: Stoma in neck, CTA Cardiovascular system:  S 1, S 2 RRR Gastrointestinal system: BS present, soft, nt Central nervous system: Alert, follows command Extremities: no edema    Data Reviewed: I have personally reviewed following labs and imaging studies  CBC: Recent Labs  Lab 04/14/22 0117 04/15/22 0729 04/16/22 0801 04/17/22 0643  WBC 12.4* 14.4* 9.3 6.7   NEUTROABS 9.5* 11.1*  --   --   HGB 9.5* 8.0* 8.6* 8.2*  HCT 29.4* 25.2* 28.0* 26.1*  MCV 79.7* 81.0 83.6 81.8  PLT 239 185 207 [id number]    Basic Metabolic Panel: Recent Labs  Lab 04/14/22 0117 04/15/22 0440 04/16/22 0801 04/17/22 0643  NA 133* 138 137 137  K 3.5 3.7 3.6 3.2*  CL 98 108 106 105  CO2 22 21* 22 24  GLUCOSE 97 95 90 84  BUN 33* 22 11 8  $ CREATININE 1.39* 1.31* 1.12 1.11  CALCIUM 9.4 7.7* 7.8* 7.7*  MG  --  2.0  --   --   PHOS  --  2.5  --   --     GFR: Estimated Creatinine Clearance: 42.3 mL/min (by C-G formula based on SCr of 1.11 mg/dL). Liver Function Tests: Recent Labs  Lab 04/14/22 0117 04/15/22 0440  AST 18 31  ALT 14 21  ALKPHOS 82 73  BILITOT 1.6* 1.1  PROT 8.4* 6.6  ALBUMIN 3.7 2.2*    No results for input(s): "LIPASE", "AMYLASE" in the last 168 hours. No results for input(s): "AMMONIA" in the last 168 hours. Coagulation Profile: Recent Labs  Lab 04/14/22 0117  INR 1.3*    Cardiac Enzymes: No results for input(s): "CKTOTAL", "CKMB", "CKMBINDEX", "TROPONINI" in the last 168 hours. BNP (last 3 results) No results for input(s): "PROBNP" in the last 8760 hours. HbA1C: Recent Labs    04/15/22 0440  HGBA1C 5.5    CBG: No results for input(s): "GLUCAP" in the last 168 hours. Lipid Profile: Recent Labs    04/16/22 0417  CHOL 121  HDL 18*  LDLCALC 88  TRIG 76  CHOLHDL 6.7    Thyroid Function Tests: No results for input(s): "TSH", "T4TOTAL", "FREET4", "T3FREE", "THYROIDAB" in the last 72 hours. Anemia Panel: Recent Labs    04/16/22 0417  VITAMINB12 211  FOLATE 7.3  FERRITIN 374*  TIBC 150*  IRON 54  RETICCTPCT 1.1    Sepsis Labs: Recent Labs  Lab 04/14/22 0117  LATICACIDVEN 1.8     Recent Results (from the past 240 hour(s))  Blood Culture (routine x 2)     Status: None (Preliminary result)   Collection Time: 04/14/22  1:17 AM   Specimen: BLOOD  Result Value Ref Range Status   Specimen Description   Final     BLOOD BLOOD RIGHT ARM Performed at [facility], [address], [place], [place] [address]    Special Requests   Final    NONE Performed at [facility], [address], [place], [place] [address]    Culture   Final    NO GROWTH 3 DAYS Performed at [facility], [place] [address] [place], [place] [address]    Report Status PENDING  Incomplete  Resp panel by RT-PCR (RSV, Flu A&B, Covid) Anterior Nasal Swab     Status: None   Collection Time: 04/14/22  1:17 AM   Specimen: Anterior Nasal Swab  Result Value Ref Range Status   SARS Coronavirus 2 by RT PCR NEGATIVE NEGATIVE Final    Comment: (NOTE) SARS-CoV-2 target nucleic acids are NOT DETECTED.  The SARS-CoV-2 RNA is generally detectable in upper respiratory specimens during the acute phase of infection. The lowest concentration of SARS-CoV-2 viral copies this assay can detect is 138 copies/mL. A negative result does not preclude SARS-Cov-2 infection and should not be used as the sole basis for treatment or other patient management decisions. A negative result may occur with  improper specimen collection/handling, submission of specimen other than nasopharyngeal swab, presence of viral mutation(s) within the areas targeted by this assay, and inadequate number of viral copies(<138 copies/mL). A negative result must be combined with clinical observations, patient history, and epidemiological information. The expected result is Negative.  Fact Sheet for Patients:  EntrepreneurPulse.com.au  Fact Sheet for Healthcare Providers:  IncredibleEmployment.be  This test is no t yet approved or cleared by the [place] FDA and  has been authorized for detection and/or diagnosis of SARS-CoV-2 by FDA under an Emergency Use Authorization (EUA). This EUA will remain  in effect (meaning this test can be used) for the duration of the COVID-19 declaration under  Section 564(b)(1) of the Act, 21 U.S.C.section 360bbb-3(b)(1), unless the authorization is terminated  or revoked sooner.       Influenza A by PCR NEGATIVE NEGATIVE Final   Influenza B by PCR NEGATIVE NEGATIVE Final    Comment: (NOTE) The Xpert Xpress SARS-CoV-2/FLU/RSV plus assay is intended as an aid in the diagnosis of influenza from Nasopharyngeal swab specimens and should not be used as a sole basis for treatment. Nasal washings and aspirates are unacceptable for Xpert Xpress SARS-CoV-2/FLU/RSV testing.  Fact Sheet for Patients: EntrepreneurPulse.com.au  Fact Sheet for Healthcare Providers: IncredibleEmployment.be  This test is not yet approved or cleared by the [place] FDA and has been authorized for detection and/or diagnosis of SARS-CoV-2 by FDA under an Emergency Use Authorization (EUA). This EUA will remain in effect (meaning this test can be used) for the duration of the COVID-19 declaration under Section 564(b)(1) of the Act, 21 U.S.C. section 360bbb-3(b)(1), unless the authorization is terminated or revoked.     Resp Syncytial Virus by PCR NEGATIVE NEGATIVE Final    Comment: (NOTE) Fact Sheet for Patients: EntrepreneurPulse.com.au  Fact Sheet for Healthcare Providers: IncredibleEmployment.be  This test is not yet approved or cleared by the [place] FDA and has been authorized for detection and/or diagnosis of SARS-CoV-2 by FDA under an Emergency Use Authorization (EUA). This EUA will remain in effect (meaning this test can be used) for the duration of the COVID-19 declaration under Section 564(b)(1) of the Act, 21 U.S.C. section 360bbb-3(b)(1), unless the authorization is terminated or revoked.  Performed at [facility], [address], [place], [place] [address]   Urine Culture     Status: Abnormal   Collection Time: 04/14/22  1:38 AM   Specimen:  Urine, Clean Catch  Result Value Ref Range Status   Specimen Description   Final    URINE, CLEAN CATCH Performed at [facility], [place] [address], [place], [place] [address]    Special Requests   Final    NONE Reflexed from [id number] Performed at [facility], [address] [place], [place] [address]    Culture (A)  Final    >=100,000 COLONIES/mL AEROCOCCUS SPECIES 40,000 COLONIES/mL ENTEROCOCCUS FAECALIS Standardized susceptibility testing for this organism is not available. Performed at [facility], [place] [address], [place], [place] [address]    Report Status 04/17/2022 FINAL  Final   Organism ID, Bacteria ENTEROCOCCUS FAECALIS (A)  Final  Susceptibility   Enterococcus faecalis - MIC*    AMPICILLIN <=2 SENSITIVE Sensitive     NITROFURANTOIN <=16 SENSITIVE Sensitive     VANCOMYCIN 1 SENSITIVE Sensitive     * 40,000 COLONIES/mL ENTEROCOCCUS FAECALIS         Radiology Studies: No results found.      Scheduled Meds:  amoxicillin  500 mg Oral TID   cyanocobalamin  1,000 mcg Intramuscular Daily   diclofenac Sodium  2 g Topical QID   heparin injection (subcutaneous)  5,000 Units Subcutaneous Q8H   levothyroxine  137 mcg Oral Q0600   omega-3 acid ethyl esters  1 g Oral BID   Continuous Infusions:  lactated ringers 75 mL/hr at 04/17/22 1520     LOS: 3 days    Time spent: 35 minutes    Jossette Zirbel A Mylea Roarty, MD [facility]   If 7PM-7AM, please contact night-coverage [email or web address]  04/17/2022, 3:27 PM

## 2022-04-17 NOTE — Plan of Care (Signed)
  Problem: Health Behavior/Discharge Planning: Goal: Ability to manage health-related needs will improve Outcome: Progressing   Problem: Clinical Measurements: Goal: Ability to maintain clinical measurements within normal limits will improve Outcome: Progressing Goal: Will remain free from infection Outcome: Progressing Goal: Respiratory complications will improve Outcome: Progressing   Problem: Activity: Goal: Risk for activity intolerance will decrease Outcome: Progressing

## 2022-04-17 NOTE — TOC Progression Note (Signed)
Transition of Care [facility]) - Progression Note    Patient Details  Name: [name] MRN: [id number] Date of Birth: 08-03-1950  Transition of Care [facility]) CM/SW [place], LCSW Phone Number: 04/17/2022, 4:03 PM  Clinical Narrative:    [facility] HH[facility]/ OT services . TOC to follow.    Expected Discharge Plan:  (TBA) Barriers to Discharge: Continued Medical Work up  Expected Discharge Plan and Services       Living arrangements for the past 2 months: [place]: PT, OT [facility] Agency: [facility] Date [place]: 04/17/22       Social Determinants of Health (SDOH) Interventions SDOH Screenings   Food Insecurity: No Food Insecurity (04/14/2022)  Housing: Low Risk  (04/14/2022)  Transportation Needs: No Transportation Needs (04/14/2022)  Utilities: Not At Risk (04/14/2022)  Depression (PHQ2-9): Low Risk  (03/03/2022)  Financial Resource Strain: Low Risk  (03/03/2022)  Physical Activity: Insufficiently Active (03/03/2022)  Social Connections: Socially Isolated (03/03/2022)  Stress: No Stress Concern Present (03/03/2022)  Tobacco Use: Medium Risk (04/14/2022)    Readmission Risk Interventions     No data to display

## 2022-04-17 NOTE — TOC Initial Note (Signed)
Transition of Care [facility]) - Initial/Assessment Note    Patient Details  Name: [name] MRN: [id number] Date of Birth: 12-15-1950  Transition of Care [facility]) CM/SW Contact:    Illene Regulus, LCSW Phone Number: 04/17/2022, 3:40 PM  Clinical Narrative:                 CSW spoke with pt's daughter about the recommendation for [facility] services. Pt's daughter is agreeable and would like to have OT added to [facility] services. Pt's daughter would prefer [facility] for [facility] services. TOC has reached out to [facility] with [facility], they are reviewing pt. TOC to follow.     Expected Discharge Plan:  (TBA) Barriers to Discharge: Continued Medical Work up   Patient Goals and CMS Choice Patient states their goals for this hospitalization and ongoing recovery are:: return home with home health CMS Medicare.gov Compare Post Acute Care list provided to:: Patient Represenative (must comment) Choice offered to / list presented to : Adult Children      Expected Discharge Plan and Services       Living arrangements for the past 2 months: Single Family Home                           HH Arranged: PT, OT HH Agency: [facility] Date [facility] Agency Contacted: 04/17/22      Prior Living Arrangements/Services Living arrangements for the past 2 months: Single Family Home Lives with:: Self Patient language and need for interpreter reviewed:: Yes Do you feel safe going back to the place where you live?: Yes      Need for Family Participation in Patient Care: Yes (Comment) Care giver support system in place?: Yes (comment)      Activities of Daily Living Home Assistive Devices/Equipment: None ADL Screening (condition at time of admission) [name] cognitive ability adequate to safely complete daily activities?: Yes Is the patient deaf or have difficulty hearing?: Yes Does the patient have difficulty seeing, even when wearing glasses/contacts?: No Does the patient have difficulty  concentrating, remembering, or making decisions?: No Patient able to express need for assistance with ADLs?: Yes Does the patient have difficulty dressing or bathing?: No Independently performs ADLs?: No Communication: Independent with device (comment) Dressing (OT): Needs assistance Is this a change from baseline?: Pre-admission baseline Grooming: Needs assistance Is this a change from baseline?: Pre-admission baseline Feeding: Independent Bathing: Needs assistance Is this a change from baseline?: Pre-admission baseline Toileting: Independent In/Out Bed: Needs assistance Is this a change from baseline?: Pre-admission baseline Walks in Home: Independent Does the patient have difficulty walking or climbing stairs?: No Weakness of Legs: None Weakness of Arms/Hands: None  Permission Sought/Granted                  Emotional Assessment              Admission diagnosis:  Pyelonephritis [N12] Meningioma ([place]) [D32.9] Pulmonary nodule [R91.1] Sepsis ([place]) [A41.9] Sepsis, due to unspecified organism, unspecified whether acute organ dysfunction present [facility]) [A41.9] UTI (urinary tract infection) [N39.0] Patient Active Problem List   Diagnosis Date Noted   Sepsis ([place]) 04/14/2022   UTI (urinary tract infection) 04/14/2022   Left ear pain 12/27/2021   Neck pain 12/27/2021   Moderate protein-calorie malnutrition ([place]) 11/20/2019   SAH (subarachnoid hemorrhage) ([place]) 11/20/2019   Tracheostomy care ([place]) 10/23/2019   Gastrostomy tube dysfunction ([place]) 10/23/2019   Frequent falls    Alcohol abuse  Seizure ([place]) 07/20/2019   Fall at home, initial encounter 07/20/2019   Alcohol abuse with intoxication ([place]) 07/20/2019   History of cirrhosis of liver 07/20/2019   CKD (chronic kidney disease), stage III ([place]) 07/20/2019   AKI (acute kidney injury) ([place]) 07/20/2019   Olecranon fracture, right, closed, initial encounter 07/20/2019   Distal end of ulna fracture, closed  07/20/2019   Seizure-like activity ([place]) 07/13/2019   Right sided weakness 07/03/2019   Numbness of right hand 06/12/2019   Status post total replacement of right hip 09/15/2016   Throat cancer ([place])    Esophageal mass    Odynophagia    Protein-calorie malnutrition, severe ([place]) 04/10/2014   Pancreatitis, chronic ([place]) 05/20/2013   Primary adrenal insufficiency (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[place]) 11/23/2011   Hypothyroidism 10/16/2010   Malignant neoplasm of larynx ([place]) 01/10/2010   PULMONARY NODULE 12/31/2007   Essential hypertension, benign 11/08/2007   PCP:  Rise Patience, DO Pharmacy:   [facility], [place] [address] [place] AT [facility] OF [place] [address] Phone: [phone] Fax: [place], [place] AT [place] [address] Phone: [phone] Fax: [place], [address] AT [facility] OF [place] [address] Phone: [phone] Fax: [phone]     Social Determinants of Health ([place]) Social History: [place]: No Food Insecurity (04/14/2022)  Housing: Low Risk  (04/14/2022)  Transportation Needs: No Transportation Needs (04/14/2022)  Utilities: Not At Risk (04/14/2022)  Depression (PHQ2-9): Low Risk  (03/03/2022)  Financial Resource Strain: Low Risk  (03/03/2022)  Physical Activity: Insufficiently Active (03/03/2022)  Social Connections: Socially Isolated (03/03/2022)  Stress: No Stress Concern Present (03/03/2022)  Tobacco Use: Medium Risk (04/14/2022)   SDOH Interventions:      Readmission Risk Interventions     No data to display
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
## 2022-04-17 NOTE — Progress Notes (Signed)
Mobility Specialist - Progress Note   04/17/22 1532  Mobility  Activity Ambulated independently in hallway  Level of Assistance Modified independent, requires aide device or extra time  Assistive Device None  Distance Ambulated (ft) 350 ft  Range of Motion/Exercises Active  Activity Response Tolerated well  Mobility Referral Yes  $Mobility charge 1 Mobility   Pt was found in bed and agreeable to ambulate. Had no complaints and at EOS returned to bathroom. Pt was told to pull call bell when finished and nodded his head in understanding. NT notified.  Ferd Hibbs Mobility Specialist

## 2022-04-18 DIAGNOSIS — N12 Tubulo-interstitial nephritis, not specified as acute or chronic: Secondary | ICD-10-CM | POA: Diagnosis not present

## 2022-04-18 LAB — BASIC METABOLIC PANEL
Anion gap: 9 (ref 5–15)
BUN: 10 mg/dL (ref 8–23)
CO2: 26 mmol/L (ref 22–32)
Calcium: 8.2 mg/dL — ABNORMAL LOW (ref 8.9–10.3)
Chloride: 104 mmol/L (ref 98–111)
Creatinine, Ser: 1.08 mg/dL (ref 0.61–1.24)
GFR, Estimated: >60 mL/min (ref >60)
Glucose, Bld: 91 mg/dL (ref 70–99)
Potassium: 3.3 mmol/L — ABNORMAL LOW (ref 3.5–5.1)
Sodium: 139 mmol/L (ref 135–145)

## 2022-04-18 MED ORDER — CYANOCOBALAMIN 500 MCG PO TABS: 1000.0000 ug | ORAL_TABLET | Freq: Every day | ORAL | 0 refills | Status: DC

## 2022-04-18 MED ORDER — AMOXICILLIN 500 MG PO CAPS: 500.0000 mg | ORAL_CAPSULE | Freq: Three times a day (TID) | ORAL | 0 refills | Status: AC

## 2022-04-18 MED ORDER — ICOSAPENT ETHYL 1 G PO CAPS: 2.0000 g | ORAL_CAPSULE | Freq: Two times a day (BID) | ORAL | 0 refills | Status: DC

## 2022-04-18 MED ORDER — POTASSIUM CHLORIDE CRYS ER 20 MEQ PO TBCR
40.0000 meq | EXTENDED_RELEASE_TABLET | ORAL | Status: AC
  Administered 2022-04-18 (×2): 40 meq via ORAL
  Filled 2022-04-18 (×2): qty 2

## 2022-04-18 MED ORDER — OMEGA-3-ACID ETHYL ESTERS 1 G PO CAPS: 1.0000 g | ORAL_CAPSULE | Freq: Two times a day (BID) | ORAL | 1 refills | Status: DC

## 2022-04-18 MED ORDER — AMOXICILLIN 500 MG PO CAPS: 500.0000 mg | ORAL_CAPSULE | Freq: Three times a day (TID) | ORAL | 0 refills | Status: DC

## 2022-04-18 MED ORDER — POTASSIUM CHLORIDE CRYS ER 20 MEQ PO TBCR: 40.0000 meq | EXTENDED_RELEASE_TABLET | Freq: Every day | ORAL | 0 refills | Status: DC

## 2022-04-18 NOTE — Discharge Summary (Signed)
Physician Discharge Summary   Patient: [name] MRN: [id number] DOB: [birth year]  Admit date:     04/14/2022  Discharge date: 04/18/22  Discharge Physician: Elmarie Shiley   PCP: Rise Patience, DO   Recommendations at discharge:    Needs follow up with urology for cystoscopy for bladder wall thickening and Prostatomegaly  Needs follow up for gait problems, Meningioma.  Chronic avascular necrosis left femoral head by CT--needs follow up New 7 mm right upper lobe pulmonary nodule since 2016--Needs CT in 6 month   Occluded left internal carotid artery since 2016. Needs follow up  Discharge Diagnoses: Principal Problem:   UTI (urinary tract infection) Active Problems:   Sepsis ([place])  Resolved Problems:   * No resolved hospital problems. *  Hospital Course: 72 year old with past medical history significant for laryngeal cancer in remission who presents with fever, poor appetite, not feeling well for the last week, Tmax 103. He was found to have UTI, hypotensive that responded to IV fluids. CT finding consistent with right side perinephric stranding/pyelonephritis, chronic Hydronephrosis.   Assessment and Plan: 1-Sepsis secondary to right thigh pyelonephritis; Presents with fever, leukocytosis, tachycardia -UA with 21-50 WBC.  -Follow Urine culture: 100,000 Aerococcus species, 40,000 enterococcus faecalis.  -Follow Blood culture. No growth to date  Treated with IV zosyn--transition to Ampicillin. Will discharge on 4 more days antibiotics.  WBC trending down.    Chronic moderate to severe right hydronephrosis and mild right ureterectasis ; -No ureteral or intrarenal stones by CT. -Degree of hydronephrosis and ureterectasis seems unchanged -plan to monitor for clinical improvement. If fever persist or sign of infection persist will need formal urology consultation.  -he will need follow up with urology out patient for cystoscopy and Prostatomegaly.  Improving  clinically, no further fever.  -post void residual fluctuates 240--300 Urology consulted. no need for foley catheter. Follow up with urology out patient.    History of laryngeal cancer; CT neck: Chronic laryngectomy with tracheal stoma.  Questionable mild tracheitis. -SP chemo and radiation. On remission.    Acute hyponatremia: Treated  with IV fluids.    Anemia; of chronic diseases.  B 12 low, started supplement. Will provide prescription.    CKD stage IIIa; Cr on admission 1.3 AKI ruled out.  Prior cr per recods 1.3--1,4   Hypothyroidism: Continue with Synthroid.    Chronic avascular necrosis left femoral head by CT Follow up out patient.    Prostatomegaly: will need follow up with urology     New 7 mm right upper lobe pulmonary nodule since 2016 -Need follow-up noncontrast CT chest 6 to 12 months.   Occluded left internal carotid artery since 2016. LDL 88 Needs follow up with vascular.  Balance problem FU neurology  Slowly enlarging 2 cm intracranial planum sphenoidale Meningioma. No evidence of adjacent cerebral edema. FU out patient.         Consultants: Urology  Procedures performed None Disposition: Home Diet recommendation:  Discharge Diet Orders (From admission, onward)     Start     Ordered   04/18/22 0000  Diet - low sodium heart healthy        04/18/22 [id number]           Regular diet DISCHARGE MEDICATION: Allergies as of 04/18/2022       Reactions   Succinylcholine Other (See Comments)   ADVERSE REACTION (not an allergy) Following 1st attempt to place tracheotomy tube in 2011 at [facility]; patient went into "succinylcholine induced pulseless electrical activity  secondary to probable hyperkalemia" and underwent chest compressions and placement of endotracheal tube with admission to medical ICU.  EXTREMELY HIGH SEVERE REACTION.    Tylenol [acetaminophen] Other (See Comments), Hypertension   HBP -Liver Cirrhosis   Vicodin  [hydrocodone-acetaminophen] Rash   Other    Unknown anesthesia medicine caused cardiac arrest.   Protonix [pantoprazole]         Medication List     STOP taking these medications    guaiFENesin-dextromethorphan 100-10 MG/5ML syrup Commonly known as: ROBITUSSIN DM       TAKE these medications    acetic acid-hydrocortisone OTIC solution Commonly known as: VOSOL-HC Place 4 drops into the left ear 2 (two) times daily.   amoxicillin 500 MG capsule Commonly known as: AMOXIL Take 1 capsule (500 mg total) by mouth 3 (three) times daily for 7 days.   aspirin EC 81 MG tablet Take 81 mg by mouth daily. Swallow whole.   cyanocobalamin 500 MCG tablet Commonly known as: VITAMIN B12 Take 2 tablets (1,000 mcg total) by mouth daily.   diclofenac Sodium 1 % Gel Commonly known as: VOLTAREN Apply 2 g topically 2 (two) times daily as needed (pain).   guaiFENesin 600 MG 12 hr tablet Commonly known as: MUCINEX Take 600 mg by mouth 2 (two) times daily as needed for cough or to loosen phlegm.   icosapent Ethyl 1 g capsule Commonly known as: Vascepa Take 2 capsules (2 g total) by mouth 2 (two) times daily.   levothyroxine 137 MCG tablet Commonly known as: SYNTHROID TAKE 1 TABLET(137 MCG) BY MOUTH DAILY BEFORE BREAKFAST What changed: See the new instructions.   potassium chloride SA 20 MEQ tablet Commonly known as: KLOR-CON M Take 2 tablets (40 mEq total) by mouth daily for 3 days.        Follow-up Information     Your primary care physician. Schedule an appointment as soon as possible for a visit .   Why: 3-6 months for evaluation of pulmonary nodule        [facility] Follow up in 1 week(s).   Why: to make and appintment for Hydronephrosis, and Bladder thickeing. Contact information: [place] [employer] [place] [phone]        [facility] Follow up.   Why: [facility] will contact you 24 to 48hr after  discharge               Discharge Exam: Filed Weights   04/14/22 0031  Weight: 47.6 kg   General; NAD  Condition at discharge: stable  The results of significant diagnostics from this hospitalization (including imaging, microbiology, ancillary and laboratory) are listed below for reference.   Imaging Studies: CT ABDOMEN PELVIS W CONTRAST  Result Date: 04/14/2022 CLINICAL DATA:  Sepsis. EXAM: CT ABDOMEN AND PELVIS WITH CONTRAST TECHNIQUE: Multidetector CT imaging of the abdomen and pelvis was performed using the standard protocol following bolus administration of intravenous contrast. RADIATION DOSE REDUCTION: This exam was performed according to the departmental dose-optimization program which includes automated exposure control, adjustment of the mA and/or kV according to patient size and/or use of iterative reconstruction technique. CONTRAST:  56m OMNIPAQUE IOHEXOL 300 MG/ML  SOLN COMPARISON:  CTs with IV contrast 07/30/2014 and 11/14/2010. FINDINGS: Lower chest: There is posterior atelectasis in the lower lobes, calcified granuloma laterally in the left lower lobe. Lung bases are clear of infiltrates. The cardiac size is normal. There are scattered calcifications in the coronary arteries, patchy  calcification in the descending aorta, no pericardial effusion. Hepatobiliary: Respiratory motion limits evaluation. There is no obvious mass. There are small stones in the gallbladder but no wall thickening, no biliary dilatation. Pancreas: Coarse calcifications are again seen in the head and uncinate process consistent with chronic calcific pancreatitis. There is slight dilatation of the pancreatic duct which were noted previously and seems unchanged. There are no inflammatory changes. No masses seen through the motion artifact. Spleen: No abnormality is seen through the breathing artifacts. Adrenals/Urinary Tract: There is no adrenal mass. No mass enhancement in either kidney. There is  questionable patchy hypoenhancement in the right kidney which could be exaggerated due to artifact from the patient's arms and due to respiratory motion or could indicate pyelonephritis. There is asymmetric right perinephric stranding. Moderately dilated left renal pelvis is chronically noted and unchanged without intrarenal caliectasis likely reflecting as extrarenal pelvis. 8 mm nonobstructive caliceal stone inferior pole right kidney appear similar. No other nephrolithiasis is seen. Moderate to severe right hydronephrosis is similar to prior studies as well as mild ureterectasis, with no ureteral stone being seen. There is diffuse thickening of the bladder wall, greater to the right than the left. No focal masses seen but portions of the inferior bladder are obscured by spray artifact from a right hip arthroplasty. Stomach/Bowel: No dilatation or wall thickening. Uncomplicated left-sided diverticulosis. An appendix is not seen in this patient. Vascular/Lymphatic: There is heavy aortoiliac calcific plaque with branch vessel atherosclerosis. No AAA. No adenopathy is seen. Reproductive: Prostatomegaly with transverse diameter 4.8 cm. Other: There is minimal fluid around the posterior right kidney. There is minimal abdominal and pelvic ascites. No free hemorrhage or free air is seen. There has been interval weight loss with decreasing thickness of body fat consistent with cachexia. There are no incarcerated hernias. Musculoskeletal: As above, a right hip arthroplasty is noted with changes of chronic avascular necrosis across the superior left femoral head and mild secondary DJD. No significant articular surface subsidence is seen. There is osteopenia and mild degenerative changes of the lumbar spine. No destructive bone lesion is seen. IMPRESSION: 1. Right perinephric stranding with questionable patchy hypoenhancement in the right kidney which could be due to pyelonephritis, or artifact from the patient's arms and  respiratory motion. 2. Chronic moderate to severe right hydronephrosis and mild right ureterectasis with no ureteral or intrarenal stone. The degree of hydronephrosis and ureterectasis seems unchanged. 3. Diffuse thickening of the bladder wall, greater to the right than the left, not seen previously. This could be due to hypertrophy or cystitis. 4. Prostatomegaly. 5. Chronic calcific pancreatitis. No acute inflammation is seen through the breathing motion. 6. Cholelithiasis. 7. Aortic and coronary artery atherosclerosis. 8. Minimal ascites. 9. Cachexia. 10. Chronic avascular necrosis left femoral head. Aortic Atherosclerosis (ICD10-I70.0). Electronically Signed   By: Telford Nab M.D.   On: 04/14/2022 05:14   CT Soft Tissue Neck W Contrast  Result Date: 04/14/2022 CLINICAL DATA:  72 year old male with a history of treated head and neck cancer. Fever and weakness. Cough, chills. Query stoma abscess. EXAM: CT NECK WITH CONTRAST TECHNIQUE: Multidetector CT imaging of the neck was performed using the standard protocol following the bolus administration of intravenous contrast. RADIATION DOSE REDUCTION: This exam was performed according to the departmental dose-optimization program which includes automated exposure control, adjustment of the mA and/or kV according to patient size and/or use of iterative reconstruction technique. CONTRAST:  75m OMNIPAQUE IOHEXOL 300 MG/ML  SOLN COMPARISON:  The most recent post treatment Neck  CT is 04/09/2014. however, comparison is also made to noncontrast cervical spine CT 07/20/2019. FINDINGS: Pharynx and larynx: Chronic laryngectomy with tracheal stoma. Parapharyngeal and retropharyngeal postoperative architectural distortion appears stable since 2016, with multiple chronic bilateral parapharyngeal surgical clips. The nasopharynx contour is normal. The oropharynx is mostly effaced, but appearance is similar to 2016. Mildly gas-filled cervical esophagus. No discrete mass or fluid  collection at the stoma site, although the tracheal walls there appear mildly more thickened than in 2016. There is a small stoma tube connecting the trachea and esophagus at the thoracic inlet, new from 2016. Salivary glands: Unchanged post treatment or other chronic submandibular gland atrophy. Sublingual space is stable and within normal limits. Parotid glands are within normal limits. Thyroid: Chronically diminutive. Satisfactory post tracheal stoma appearance. Lymph nodes: Sequelae of bilateral neck dissection. No lymphadenopathy identified. Surgical clip configuration appears stable since 2016. Vascular: Bilateral internal jugular veins remain in place. Bilateral IJ appear to remain patent, the right is dominant. Right carotid arteries remain patent to the skull base. However, left internal carotid artery has occluded since 2016, with no reconstitution in the neck or at the skull base. Vertebral arteries remain patent, although the right is now atherosclerotic and diminutive. Limited intracranial: Calcified atherosclerosis at the skull base. Evidence of reconstituted enhancement at the left ICA terminus. Chronic round roughly 2 cm enhancing planum sphenoidale mass was 16 mm in 2016 compatible with slowly enlarging meningioma. No evidence of inferior frontal lobe edema or intracranial mass effect. Visualized orbits: Negative. Mastoids and visualized paranasal sinuses: Paranasal sinus aeration is stable to improved since 2016. Tympanic cavities and mastoids are clear. Skeleton: Chronically absent dentition. Chronic cervical spine degeneration. Stable mild chronic T1 anterior compression. No acute or suspicious osseous lesion identified. Upper chest: Trachea remains patent although with mild increased circumferential wall thickening when compared to 2016. There is a new lobulated 7 mm right upper lobe pulmonary nodule since 2016 on series 3, image 100. This was not included in 2021. Otherwise the upper lungs  appear stable. Calcified aortic atherosclerosis. No inflammation is evident in the visible superior mediastinum, although only the pre-vascular space is included. The visible thoracic esophagus is decompressed. IMPRESSION: 1. Chronic laryngectomy with tracheal stoma. Questionable mild increased tracheal wall thickening since 2016 and 2021, consider Tracheitis. But no other inflammatory process is evident in the neck; no fluid collection or abscess. 2. New 7 mm right upper lobe pulmonary nodule since 2016. Non-contrast chest CT at 6-12 months is recommended. If the nodule is stable at time of repeat CT, then future CT at 18-24 months (from today's scan) is considered optional for low-risk patients, but is recommended for high-risk patients. This recommendation follows the consensus statement: Guidelines for Management of Incidental Pulmonary Nodules Detected on CT Images: From the Fleischner Society 2017; Radiology 2017; 284:228-243. 3. Occluded left internal carotid artery since 2016. 4. Slowly enlarging 2 cm intracranial planum sphenoidale Meningioma. No evidence of adjacent cerebral edema. 5.  Aortic Atherosclerosis (ICD10-I70.0). Electronically Signed   By: Genevie Ann M.D.   On: 04/14/2022 04:31   DG Chest Port 1 View  Result Date: 04/14/2022 CLINICAL DATA:  Questionable sepsis with fever and generalized weakness. Coughing and chills as well. EXAM: PORTABLE CHEST 1 VIEW COMPARISON:  AP Lat 07/04/2016 FINDINGS: The lungs are mildly emphysematous but clear.  The sulci are sharp. Heart size and vasculature are normal apart from mild aortic tortuosity with patchy calcific plaques. Stable mediastinum. The sulci are sharp. Single surgical clip again noted  medially right chest apex. Osteopenia. IMPRESSION: No evidence of acute chest process. COPD. Aortic atherosclerosis. Electronically Signed   By: Telford Nab M.D.   On: 04/14/2022 00:52    Microbiology: Results for orders placed or performed during the hospital  encounter of 04/14/22  Blood Culture (routine x 2)     Status: None (Preliminary result)   Collection Time: 04/14/22  1:17 AM   Specimen: BLOOD  Result Value Ref Range Status   Specimen Description   Final    BLOOD BLOOD RIGHT ARM Performed at [facility], [address], [place], [place] [address]    Special Requests   Final    NONE Performed at [facility], [address] [place], [place] [address]    Culture   Final    NO GROWTH 4 DAYS Performed at [facility], [place] [address], [place], [place] [address]    Report Status PENDING  Incomplete  Resp panel by RT-PCR (RSV, Flu A&B, Covid) Anterior Nasal Swab     Status: None   Collection Time: 04/14/22  1:17 AM   Specimen: Anterior Nasal Swab  Result Value Ref Range Status   SARS Coronavirus 2 by RT PCR NEGATIVE NEGATIVE Final    Comment: (NOTE) SARS-CoV-2 target nucleic acids are NOT DETECTED.  The SARS-CoV-2 RNA is generally detectable in upper respiratory specimens during the acute phase of infection. The lowest concentration of SARS-CoV-2 viral copies this assay can detect is 138 copies/mL. A negative result does not preclude SARS-Cov-2 infection and should not be used as the sole basis for treatment or other patient management decisions. A negative result may occur with  improper specimen collection/handling, submission of specimen other than nasopharyngeal swab, presence of viral mutation(s) within the areas targeted by this assay, and inadequate number of viral copies(<138 copies/mL). A negative result must be combined with clinical observations, patient history, and epidemiological information. The expected result is Negative.  Fact Sheet for Patients:  EntrepreneurPulse.com.au  Fact Sheet for Healthcare Providers:  IncredibleEmployment.be  This test is no t yet approved or cleared by the [place] FDA and  has been authorized  for detection and/or diagnosis of SARS-CoV-2 by FDA under an Emergency Use Authorization (EUA). This EUA will remain  in effect (meaning this test can be used) for the duration of the COVID-19 declaration under Section 564(b)(1) of the Act, 21 U.S.C.section 360bbb-3(b)(1), unless the authorization is terminated  or revoked sooner.       Influenza A by PCR NEGATIVE NEGATIVE Final   Influenza B by PCR NEGATIVE NEGATIVE Final    Comment: (NOTE) The Xpert Xpress SARS-CoV-2/FLU/RSV plus assay is intended as an aid in the diagnosis of influenza from Nasopharyngeal swab specimens and should not be used as a sole basis for treatment. Nasal washings and aspirates are unacceptable for Xpert Xpress SARS-CoV-2/FLU/RSV testing.  Fact Sheet for Patients: EntrepreneurPulse.com.au  Fact Sheet for Healthcare Providers: IncredibleEmployment.be  This test is not yet approved or cleared by the [place] FDA and has been authorized for detection and/or diagnosis of SARS-CoV-2 by FDA under an Emergency Use Authorization (EUA). This EUA will remain in effect (meaning this test can be used) for the duration of the COVID-19 declaration under Section 564(b)(1) of the Act, 21 U.S.C. section 360bbb-3(b)(1), unless the authorization is terminated or revoked.     Resp Syncytial Virus by PCR NEGATIVE NEGATIVE Final    Comment: (NOTE) Fact Sheet for Patients: EntrepreneurPulse.com.au  Fact Sheet for Healthcare Providers: IncredibleEmployment.be  This test is not yet approved or cleared by the [place] and has been authorized for detection and/or diagnosis of SARS-CoV-2 by FDA under an Emergency Use Authorization (EUA). This EUA will remain in effect (meaning this test can be used) for the duration of the COVID-19 declaration under Section 564(b)(1) of the Act, 21 U.S.C. section 360bbb-3(b)(1), unless the authorization is  terminated or revoked.  Performed at [facility], [address] [place], [place] [address]   Urine Culture     Status: Abnormal   Collection Time: 04/14/22  1:38 AM   Specimen: Urine, Clean Catch  Result Value Ref Range Status   Specimen Description   Final    URINE, CLEAN CATCH Performed at [facility], [place] [address] [place], [place] [address]    Special Requests   Final    NONE Reflexed from [id number] Performed at [facility], [address] [place], [place] [address]    Culture (A)  Final    >=100,000 COLONIES/mL AEROCOCCUS SPECIES 40,000 COLONIES/mL ENTEROCOCCUS FAECALIS Standardized susceptibility testing for this organism is not available. Performed at [facility], [place] [address], [place], [place] [address]    Report Status 04/17/2022 FINAL  Final   Organism ID, Bacteria ENTEROCOCCUS FAECALIS (A)  Final      Susceptibility   Enterococcus faecalis - MIC*    AMPICILLIN <=2 SENSITIVE Sensitive     NITROFURANTOIN <=16 SENSITIVE Sensitive     VANCOMYCIN 1 SENSITIVE Sensitive     * 40,000 COLONIES/mL ENTEROCOCCUS FAECALIS    Labs: CBC: Recent Labs  Lab 04/14/22 0117 04/15/22 0729 04/16/22 0801 04/17/22 0643  WBC 12.4* 14.4* 9.3 6.7  NEUTROABS 9.5* 11.1*  --   --   HGB 9.5* 8.0* 8.6* 8.2*  HCT 29.4* 25.2* 28.0* 26.1*  MCV 79.7* 81.0 83.6 81.8  PLT 239 185 207 [id number]   Basic Metabolic Panel: Recent Labs  Lab 04/14/22 0117 04/15/22 0440 04/16/22 0801 04/17/22 0643 04/18/22 0415  NA 133* 138 137 137 139  K 3.5 3.7 3.6 3.2* 3.3*  CL 98 108 106 105 104  CO2 22 21* 22 24 26  $ GLUCOSE 97 95 90 84 91  BUN 33* 22 11 8 10  $ CREATININE 1.39* 1.31* 1.12 1.11 1.08  CALCIUM 9.4 7.7* 7.8* 7.7* 8.2*  MG  --  2.0  --   --   --   PHOS  --  2.5  --   --   --    Liver Function Tests: Recent Labs  Lab 04/14/22 0117 04/15/22 0440  AST 18 31  ALT 14 21  ALKPHOS 82 73  BILITOT 1.6* 1.1  PROT 8.4* 6.6  ALBUMIN  3.7 2.2*   CBG: No results for input(s): "GLUCAP" in the last 168 hours.  Discharge time spent: greater than 30 minutes.  Signed: Elmarie Shiley, MD [facility] 04/18/2022

## 2022-04-18 NOTE — Plan of Care (Signed)
  Problem: Education: Goal: Knowledge of General Education information will improve Description Including pain rating scale, medication(s)/side effects and non-pharmacologic comfort measures Outcome: Progressing   Problem: Health Behavior/Discharge Planning: Goal: Ability to manage health-related needs will improve Outcome: Progressing   

## 2022-04-18 NOTE — TOC Transition Note (Signed)
Transition of Care [facility]) - CM/SW Discharge Note   Patient Details  Name: [name] MRN: [id number] Date of Birth: [birth year]  [facility] CM/SW Contact:  Illene Regulus, LCSW Phone Number: 04/18/2022, 10:43 AM   Clinical Narrative:    CSW spoke with pt's daughter she will provided transportation at discharge . TOC sign off.      Barriers to Discharge: Continued Medical Work up   Patient Goals and CMS Choice CMS Medicare.gov Compare Post Acute Care list provided to:: Patient Represenative (must comment) Choice offered to / list presented to : Adult Children  Discharge Placement                         Discharge Plan and Services Additional resources added to the After Visit Summary for                            [facility] Arranged: PT, OT [facility] Agency: [facility] Date [facility] Agency Contacted: 04/17/22      Social Determinants of Health (SDOH) Interventions SDOH Screenings   Food Insecurity: No Food Insecurity (04/14/2022)  Housing: Low Risk  (04/14/2022)  Transportation Needs: No Transportation Needs (04/14/2022)  Utilities: Not At Risk (04/14/2022)  Depression (PHQ2-9): Low Risk  (03/03/2022)  Financial Resource Strain: Low Risk  (03/03/2022)  Physical Activity: Insufficiently Active (03/03/2022)  Social Connections: Socially Isolated (03/03/2022)  Stress: No Stress Concern Present (03/03/2022)  Tobacco Use: Medium Risk (04/14/2022)     Readmission Risk Interventions     No data to display

## 2022-04-19 ENCOUNTER — Telehealth: Payer: Self-pay

## 2022-04-19 LAB — CULTURE, BLOOD (ROUTINE X 2): Culture: NO GROWTH

## 2022-04-19 NOTE — Transitions of Care (Post Inpatient/ED Visit) (Signed)
   04/19/2022  Name: [name] MRN: [id number] DOB: 10/11/1950  Today's TOC FU Call Status: Today's TOC FU Call Status:: Successful TOC FU Call Competed TOC FU Call Complete Date: 04/19/22  Transition Care Management Follow-up Telephone Call Date of Discharge: 04/18/22 Discharge Facility: [facility] Type of Discharge: Inpatient Admission Primary Inpatient Discharge Diagnosis:: repeated falls How have you been since you were released from the hospital?: Better Any questions or concerns?: No  Items Reviewed: Did you receive and understand the discharge instructions provided?: Yes Medications obtained and verified?: Yes (Medications Reviewed) Any new allergies since your discharge?: No Dietary orders reviewed?: Yes Do you have support at home?: Yes People in Home: child(ren), adult  Home Care and Equipment/Supplies: [facility] Ordered?: Yes Name of [place]:: [facility] Has Agency set up a time to come to your home?: Yes [place] Visit Date: 04/20/22  Functional Questionnaire: Do you need assistance with bathing/showering or dressing?: Yes Do you need assistance with meal preparation?: Yes Do you need assistance with eating?: No Do you have difficulty maintaining continence: Yes Do you need assistance with getting out of bed/getting out of a chair/moving?: Yes Do you have difficulty managing or taking your medications?: Yes  Folllow up appointments reviewed: [facility] Follow-up appointment confirmed?: No Reason Specialist Follow-Up Not Confirmed: Patient has Specialist Provider Number and will Call for Appointment Do you need transportation to your follow-up appointment?: No Do you understand care options if your condition(s) worsen?: Yes-patient verbalized understanding    [place], [place] Direct Dial [phone]

## 2022-04-21 ENCOUNTER — Telehealth: Payer: Self-pay

## 2022-04-21 NOTE — Telephone Encounter (Signed)
[name] daughter calls nurse line regarding questions with medication after hospitalization.   She is asking if patient should be taking both Vasepa and Lovaza. AVS states that he should be taking Lovaza. However, this is not on medication list and Vascepa is.   Please advise.   Talbot Grumbling, RN

## 2022-04-28 ENCOUNTER — Ambulatory Visit (INDEPENDENT_AMBULATORY_CARE_PROVIDER_SITE_OTHER): Payer: Medicare Other | Admitting: Family Medicine

## 2022-04-28 ENCOUNTER — Telehealth: Payer: Self-pay

## 2022-04-28 VITALS — BP 97/76 | HR 91 | Ht 71.0 in | Wt 106.2 lb

## 2022-04-28 DIAGNOSIS — Z23 Encounter for immunization: Secondary | ICD-10-CM

## 2022-04-28 DIAGNOSIS — I6522 Occlusion and stenosis of left carotid artery: Secondary | ICD-10-CM

## 2022-04-28 DIAGNOSIS — N401 Enlarged prostate with lower urinary tract symptoms: Secondary | ICD-10-CM | POA: Diagnosis not present

## 2022-04-28 DIAGNOSIS — M87052 Idiopathic aseptic necrosis of left femur: Secondary | ICD-10-CM | POA: Insufficient documentation

## 2022-04-28 DIAGNOSIS — H9202 Otalgia, left ear: Secondary | ICD-10-CM

## 2022-04-28 DIAGNOSIS — Z125 Encounter for screening for malignant neoplasm of prostate: Secondary | ICD-10-CM | POA: Diagnosis not present

## 2022-04-28 DIAGNOSIS — N130 Hydronephrosis with ureteropelvic junction obstruction: Secondary | ICD-10-CM | POA: Diagnosis not present

## 2022-04-28 DIAGNOSIS — R350 Frequency of micturition: Secondary | ICD-10-CM | POA: Diagnosis not present

## 2022-04-28 DIAGNOSIS — I10 Essential (primary) hypertension: Secondary | ICD-10-CM

## 2022-04-28 DIAGNOSIS — R3914 Feeling of incomplete bladder emptying: Secondary | ICD-10-CM | POA: Diagnosis not present

## 2022-04-28 NOTE — Patient Instructions (Signed)
He will need a repeat image of his chest in the next 6 months.  If he starts having issues with his left hip, we can always consider medications and following with orthopedics given that he has issues with his left femoral head.  I do wonder if any of the pain in his jaw could be related to the jaw joint. I am giving you some exercises to try at home for a few weeks to see if that helps. If not, I would recommend following back up in the next few weeks.   Jaw Range of Motion Exercises Jaw range of motion exercises are exercises that help your jaw move better. Exercises that help you have good posture (postural exercises) also help relieve jaw discomfort. These are often done along with range of motion exercises. These exercises can help prevent or improve: Trouble opening your mouth. Pain in your jaw while it is open or closed. Temporomandibular joint (TMJ) pain. Headache caused by jaw tension. Take other actions to prevent or relieve jaw pain, such as: Avoiding things that cause or increase jaw pain. This may include: Chewing gum or eating hard foods. Clenching your jaw or teeth, grinding your teeth, or keeping tension in your jaw muscles. Opening your mouth wide, such as for a big yawn. Leaning on your jaw, such as resting your jaw in your hand while leaning on a desk. Putting ice on your jaw. To do this: Put ice in a plastic bag. Place a towel between your skin and the bag. Leave the ice on for 20 minutes, 2-3 times a day. Remove the ice if your skin turns bright red. This is very important. If you cannot feel pain, heat, or cold, you have a greater risk of damage to the area. Only do jaw exercises that your health care provider recommends. Only move your jaw as far as it can comfortably go in each direction. Do not move your jaw into positions that cause pain. Range of motion exercises Repeat each of these exercises 8 times, 1-2 times a day, or as told by your health care  provider. Exercise A: Forward protrusion  Push your jaw forward. Hold this position for 1-2 seconds. Let your jaw return to its normal position and rest it there for 1-2 seconds. Exercise B: Controlled opening  Stand or sit in front of a mirror. Place your tongue on the roof of your mouth, just behind your top teeth. Keeping your tongue on the roof of your mouth, slowly open and close your mouth. While you open and close your mouth, watch your jaw in the mirror. Try to keep your jaw from moving to one side or the other. Exercise C: Right and left motion  Move your jaw right. Hold this position for 1-2 seconds. Let your jaw return to its normal position, and rest it there for 1-2 seconds. Move your jaw left. Hold this position for 1-2 seconds. Let your jaw return to its normal position, and rest it there for 1-2 seconds. Postural exercises Exercise A: Chin tucks  You can do this exercise sitting, standing, or lying down. Move your head straight back, keeping your head level. You can guide the movement by placing your fingers on your chin to push your jaw back in an even motion. You should be able to feel a double chin form at the end of the motion. Hold this position for 5 seconds. Repeat 10-15 times. Exercise B: Shoulder blade squeeze  Sit or stand. Bend your elbows  to about 90 degrees, which is the shape of a capital letter "L." Keep your upper arms by your body. Squeeze your shoulder blades down and back, as though you were trying to touch your elbows behind you. Do not shrug your shoulders or move your head. Hold this position for 5 seconds. Repeat 10-15 times. Exercise C: Chest stretch  Stand in a doorway with one of your feet slightly in front of the other. This is called a staggered stance. If you cannot reach your forearms to the door frame, do this exercise in a corner of a room. Put both of your hands and your forearms on the door frame, with your arms wide apart. Make sure  your arms are at a 90-degree angle to your body. Place your hands on the door frame at the height of your elbows. Slowly move your weight onto your front foot until you feel a stretch across your chest and in the front of your shoulders. Keep your head and chest upright and keep your abdominal muscles tight. Do not lean in. Hold this position for 30 seconds. Repeat 3 times. Contact a health care provider if: You have jaw pain that is new or gets worse. You have clicking or popping sounds while doing the exercises. Get help right away if: Your jaw is stuck in one place and you cannot move it. You cannot open or close your mouth. Summary Jaw range of motion exercises are exercises that help your jaw move better. Take actions to prevent or relieve jaw pain: limit chewing gum or eating hard foods; clenching your jaw or teeth; or leaning on your jaw, such as resting your jaw in your hand while leaning on a desk. Repeat each of the jaw range of motion exercises 8 times, 1-2 times a day, or as told by your health care provider. Contact a health care provider if you have clicking or popping sounds while doing the exercises. This information is not intended to replace advice given to you by your health care provider. Make sure you discuss any questions you have with your health care provider. Document Revised: 10/03/2020 Document Reviewed: 10/03/2020 Elsevier Patient Education  [place].

## 2022-04-28 NOTE — Progress Notes (Signed)
    SUBJECTIVE:   CHIEF COMPLAINT / HPI:   Hospital follow-up: Patient was hospitalized from 2/9-2/13 with sepsis secondary to right pyelonephritis that was treated with IV zosyn and transitioned to ampicillin. Patient noted to have severe R hydronephrosis and mild right uretectasis and urology was consulted with plans for outpatient follow-up.  Recommendations from hospitalization: 1.  Needs follow up with urology for cystoscopy for bladder wall thickening and Prostatomegaly (has urology appt today) 2. Needs follow up for gait problems, Meningioma. (Has neurology appt next week) 3. Chronic avascular necrosis left femoral head by CT--needs follow up (hasn't been having any issues) 4. New 7 mm right upper lobe pulmonary nodule since 2016--Needs CT in 6 month  5.  Occluded left internal carotid artery since 2016. Needs follow up   Current Condition:  Has PT/OT/SLP  Has chronic R sided weakness that will randomly cause him to have falls Eating has been an issue and he is cachectic (has allergy to remeron and doesn't like megace). Doesn't have 24h care (has M-F 3 hours during the day, stays with his daughter) ADLs:  - needs help getting dressed - needs help getting bathed - ambulates on his own - needs help with cooking - able to feed himself but needs reminder for feeding - Needs assistance with his medication (he can give them, but family sorts them for him) IADLs - dependent for financial - Doesn't drive - Could not navigate public transportation - dependent for grocery shopping    PERTINENT  PMH / PSH: Reviewed  OBJECTIVE:   BP 97/76   Pulse 91   Ht 5' 11"$  (1.803 m)   Wt 106 lb 4 oz (48.2 kg)   SpO2 99%   BMI 14.82 kg/m   Gen: Cachectic elderly man, NAD but chronically ill-appearing HEENT: Stoma in neck, TM clear bilaterally, mild dysfunction of the left TMJ CV: RRR, no m/r/g appreciated, no peripheral edema Pulm: CTAB, no wheezes/crackles GI: soft, non-tender,  non-distended  ASSESSMENT/PLAN:   Essential hypertension, benign BP appears to fluctuate, reassuringly was not symptomatic in the clinic but blood pressure did drop to 97/76 without intervention. - Continue to monitor without medications  Left ear pain Physical exam with some dysfunction of the left TMJ, patient does have history of radiation to the area and symptoms could be related to that, but recently had follow-up with ENT 1 month ago that was unremarkable. - TMJ exercises given - Follow-up in 2 to 4 weeks if not improving  PULMONARY NODULE 7 mm right upper lobe pulmonary nodule. - Repeat CT scan in 6 months  Avascular necrosis of femoral head, left (HCC) Noted on CT scan during hospitalization at [facility].  Patient is currently not having symptoms and has no issues with his left hip, does have gait instability and issues with his right side giving out.  Discussed with family the options and given the lack of need for medications at this time we will hold off on orthopedic intervention/follow-up    Rise Patience, [place]

## 2022-04-28 NOTE — Assessment & Plan Note (Signed)
7 mm right upper lobe pulmonary nodule. - Repeat CT scan in 6 months

## 2022-04-28 NOTE — Assessment & Plan Note (Signed)
Noted on CT scan during hospitalization at [facility].  Patient is currently not having symptoms and has no issues with his left hip, does have gait instability and issues with his right side giving out.  Discussed with family the options and given the lack of need for medications at this time we will hold off on orthopedic intervention/follow-up

## 2022-04-28 NOTE — Assessment & Plan Note (Signed)
Physical exam with some dysfunction of the left TMJ, patient does have history of radiation to the area and symptoms could be related to that, but recently had follow-up with ENT 1 month ago that was unremarkable. - TMJ exercises given - Follow-up in 2 to 4 weeks if not improving

## 2022-04-28 NOTE — Telephone Encounter (Signed)
[name] calls nurse line requesting verbal orders for Speech as follows.   1x a week for 1 week.   Verbal order given.

## 2022-04-28 NOTE — Assessment & Plan Note (Signed)
BP appears to fluctuate, reassuringly was not symptomatic in the clinic but blood pressure did drop to 97/76 without intervention. - Continue to monitor without medications

## 2022-05-05 DIAGNOSIS — R339 Retention of urine, unspecified: Secondary | ICD-10-CM

## 2022-05-05 HISTORY — DX: Retention of urine, unspecified: R33.9

## 2022-05-08 ENCOUNTER — Emergency Department (HOSPITAL_COMMUNITY): Payer: Medicare Other

## 2022-05-08 ENCOUNTER — Emergency Department (HOSPITAL_COMMUNITY)
Admission: EM | Admit: 2022-05-08 | Discharge: 2022-05-08 | Disposition: A | Discharge status: Home / Self Care | Payer: Medicare Other | Attending: Emergency Medicine | Admitting: Emergency Medicine

## 2022-05-08 ENCOUNTER — Other Ambulatory Visit: Payer: Self-pay

## 2022-05-08 ENCOUNTER — Encounter (HOSPITAL_COMMUNITY): Payer: Self-pay

## 2022-05-08 DIAGNOSIS — M25519 Pain in unspecified shoulder: Secondary | ICD-10-CM | POA: Diagnosis not present

## 2022-05-08 DIAGNOSIS — R2689 Other abnormalities of gait and mobility: Secondary | ICD-10-CM | POA: Diagnosis not present

## 2022-05-08 DIAGNOSIS — E871 Hypo-osmolality and hyponatremia: Secondary | ICD-10-CM | POA: Diagnosis not present

## 2022-05-08 DIAGNOSIS — Z7982 Long term (current) use of aspirin: Secondary | ICD-10-CM | POA: Diagnosis not present

## 2022-05-08 DIAGNOSIS — S42031A Displaced fracture of lateral end of right clavicle, initial encounter for closed fracture: Secondary | ICD-10-CM | POA: Diagnosis not present

## 2022-05-08 DIAGNOSIS — Y900 Blood alcohol level of less than 20 mg/100 ml: Secondary | ICD-10-CM | POA: Insufficient documentation

## 2022-05-08 DIAGNOSIS — D649 Anemia, unspecified: Secondary | ICD-10-CM | POA: Insufficient documentation

## 2022-05-08 DIAGNOSIS — R079 Chest pain, unspecified: Secondary | ICD-10-CM | POA: Diagnosis not present

## 2022-05-08 DIAGNOSIS — R4182 Altered mental status, unspecified: Secondary | ICD-10-CM

## 2022-05-08 DIAGNOSIS — W19XXXA Unspecified fall, initial encounter: Secondary | ICD-10-CM | POA: Insufficient documentation

## 2022-05-08 DIAGNOSIS — R569 Unspecified convulsions: Secondary | ICD-10-CM

## 2022-05-08 DIAGNOSIS — R0902 Hypoxemia: Secondary | ICD-10-CM | POA: Diagnosis not present

## 2022-05-08 DIAGNOSIS — M25511 Pain in right shoulder: Secondary | ICD-10-CM | POA: Diagnosis present

## 2022-05-08 DIAGNOSIS — T1490XA Injury, unspecified, initial encounter: Secondary | ICD-10-CM | POA: Diagnosis not present

## 2022-05-08 DIAGNOSIS — Z8521 Personal history of malignant neoplasm of larynx: Secondary | ICD-10-CM | POA: Insufficient documentation

## 2022-05-08 DIAGNOSIS — I10 Essential (primary) hypertension: Secondary | ICD-10-CM | POA: Diagnosis not present

## 2022-05-08 LAB — CBC
HCT: 28.6 % — ABNORMAL LOW (ref 39.0–52.0)
Hemoglobin: 8.9 g/dL — ABNORMAL LOW (ref 13.0–17.0)
MCH: 26.2 pg (ref 26.0–34.0)
MCHC: 31.1 g/dL (ref 30.0–36.0)
MCV: 84.1 fL (ref 80.0–100.0)
Platelets: 251 10*3/uL (ref 150–400)
RBC: 3.4 MIL/uL — ABNORMAL LOW (ref 4.22–5.81)
RDW: 17.9 % — ABNORMAL HIGH (ref 11.5–15.5)
WBC: 6.4 10*3/uL (ref 4.0–10.5)
nRBC: 0 % (ref 0.0–0.2)

## 2022-05-08 LAB — COMPREHENSIVE METABOLIC PANEL
ALT: 16 U/L (ref 0–44)
AST: 21 U/L (ref 15–41)
Albumin: 2.7 g/dL — ABNORMAL LOW (ref 3.5–5.0)
Alkaline Phosphatase: 81 U/L (ref 38–126)
Anion gap: 11 (ref 5–15)
BUN: 14 mg/dL (ref 8–23)
CO2: 20 mmol/L — ABNORMAL LOW (ref 22–32)
Calcium: 8.4 mg/dL — ABNORMAL LOW (ref 8.9–10.3)
Chloride: 106 mmol/L (ref 98–111)
Creatinine, Ser: 1.07 mg/dL (ref 0.61–1.24)
GFR, Estimated: >60 mL/min (ref >60)
Glucose, Bld: 82 mg/dL (ref 70–99)
Potassium: 3.7 mmol/L (ref 3.5–5.1)
Sodium: 137 mmol/L (ref 135–145)
Total Bilirubin: 0.7 mg/dL (ref 0.3–1.2)
Total Protein: 7.3 g/dL (ref 6.5–8.1)

## 2022-05-08 LAB — I-STAT VENOUS BLOOD GAS, ED
Acid-Base Excess: 1 mmol/L (ref 0.0–2.0)
Bicarbonate: 24.6 mmol/L (ref 20.0–28.0)
Calcium, Ion: 1.05 mmol/L — ABNORMAL LOW (ref 1.15–1.40)
HCT: 31 % — ABNORMAL LOW (ref 39.0–52.0)
Hemoglobin: 10.5 g/dL — ABNORMAL LOW (ref 13.0–17.0)
O2 Saturation: 98 %
Potassium: 3.9 mmol/L (ref 3.5–5.1)
Sodium: 140 mmol/L (ref 135–145)
TCO2: 26 mmol/L (ref 22–32)
pCO2, Ven: 36.3 mmHg — ABNORMAL LOW (ref 44–60)
pH, Ven: 7.439 — ABNORMAL HIGH (ref 7.25–7.43)
pO2, Ven: 99 mmHg — ABNORMAL HIGH (ref 32–45)

## 2022-05-08 LAB — ETHANOL: Alcohol, Ethyl (B): <10 mg/dL (ref <10)

## 2022-05-08 MED ORDER — LEVETIRACETAM IN NACL 1000 MG/100ML IV SOLN
1000.0000 mg | Freq: Once | INTRAVENOUS | Status: AC
  Administered 2022-05-08: 1000 mg via INTRAVENOUS
  Filled 2022-05-08: qty 100

## 2022-05-08 MED ORDER — LEVETIRACETAM 500 MG PO TABS: 500.0000 mg | ORAL_TABLET | Freq: Two times a day (BID) | ORAL | 0 refills | Status: DC

## 2022-05-08 NOTE — ED Provider Notes (Signed)
[facility] Provider Note   CSN: [id number] Arrival date & time: 05/08/22  0805     History  Chief Complaint  Patient presents with   Seizures    [name] is a 72 y.o. male.  HPI Level 5 caveat secondary to patient having prior trach and not verbal at this time  72 year old male presents via EMS.  EMS reports they were called from his home.  Daughter was the historian.  She reported that he had fallen and then had a seizure.  He is complaining of pain to his right shoulder.  He was having a seizure for about 1 minute and then was postictal.  He was postictal upon EMS arrival.  He does have a history of seizures in the past.  They report they had a recent admission with sepsis has been home for about 2 weeks. Daughter states he fell this am, then helped to stand up and lost control of his body and breathing rapidly.  Body stiffened, no twitching, did not breath for a minute.  Then took him a while to come back around.  On EMS arrival confused.   Home Medications Prior to Admission medications   Medication Sig Start Date End Date Taking? Authorizing Provider  levETIRAcetam (KEPPRA) 500 MG tablet Take 1 tablet (500 mg total) by mouth 2 (two) times daily. 05/08/22  Yes Pattricia Boss, MD  acetic acid-hydrocortisone (VOSOL-HC) OTIC solution Place 4 drops into the left ear 2 (two) times daily.    [provider]  aspirin EC 81 MG tablet Take 81 mg by mouth daily. Swallow whole.    [provider]  cyanocobalamin (VITAMIN B12) 500 MCG tablet Take 2 tablets (1,000 mcg total) by mouth daily. 04/18/22   Regalado, Belkys A, MD  diclofenac Sodium (VOLTAREN) 1 % GEL Apply 2 g topically 2 (two) times daily as needed (pain).    [provider]  guaiFENesin (MUCINEX) 600 MG 12 hr tablet Take 600 mg by mouth 2 (two) times daily as needed for cough or to loosen phlegm.    [provider]  icosapent Ethyl (VASCEPA)  1 g capsule Take 2 capsules (2 g total) by mouth 2 (two) times daily. 04/18/22   Regalado, Belkys A, MD  levothyroxine (SYNTHROID) 137 MCG tablet TAKE 1 TABLET(137 MCG) BY MOUTH DAILY BEFORE BREAKFAST Patient taking differently: Take 137 mcg by mouth daily before breakfast. 03/20/22   [name], [name], DO  potassium chloride SA (KLOR-CON M) 20 MEQ tablet Take 2 tablets (40 mEq total) by mouth daily for 3 days. 04/18/22 04/21/22  Regalado, Jerald Kief A, MD      Allergies    Succinylcholine, Tylenol [acetaminophen], Vicodin [hydrocodone-acetaminophen], Other, and Protonix [pantoprazole]    Review of Systems   Review of Systems  Physical Exam Updated Vital Signs BP (!) 165/102   Pulse 81   Temp 98 F (36.7 C) (Oral)   Resp 17   Ht 1.803 m ('5\' 11"'$ )   Wt 48.2 kg   SpO2 97%   BMI 14.82 kg/m  Physical Exam Vitals reviewed.  Constitutional:      General: He is not in acute distress.    Appearance: He is not ill-appearing.  HENT:     Head: Normocephalic and atraumatic.     Right Ear: External ear normal.     Left Ear: External ear normal.     Nose: Nose normal.     Mouth/Throat:     Comments: A  dentulous, moist, no trauma noted Eyes:     Extraocular Movements: Extraocular movements intact.     Pupils: Pupils are equal, round, and reactive to light.  Neck:     Comments: Trach site present no cannula Cardiovascular:     Rate and Rhythm: Normal rate and regular rhythm.     Pulses: Normal pulses.     Heart sounds: Normal heart sounds.  Pulmonary:     Effort: Pulmonary effort is normal.     Breath sounds: Normal breath sounds.  Abdominal:     Palpations: Abdomen is soft.  Musculoskeletal:     Cervical back: Normal range of motion and neck supple.     Comments: Deformity to right shoulder noted Pulses intact Patient with full active range of motion through left upper extremity and bilateral lower extremities No sign of trauma over pelvis Back examined no obvious signs of trauma no  point tenderness over cervical, thoracic, or lumbar spine  Skin:    General: Skin is warm.     Capillary Refill: Capillary refill takes less than 2 seconds.  Neurological:     General: No focal deficit present.     Mental Status: He is alert.     Cranial Nerves: No cranial nerve deficit.     Motor: No weakness.     Coordination: Coordination normal.     Deep Tendon Reflexes: Reflexes normal.     ED Results / Procedures / Treatments   Labs (all labs ordered are listed, but only abnormal results are displayed) Labs Reviewed  CBC - Abnormal; Notable for the following components:      Result Value   RBC 3.40 (*)    Hemoglobin 8.9 (*)    HCT 28.6 (*)    RDW 17.9 (*)    All other components within normal limits  COMPREHENSIVE METABOLIC PANEL - Abnormal; Notable for the following components:   CO2 20 (*)    Calcium 8.4 (*)    Albumin 2.7 (*)    All other components within normal limits  I-STAT VENOUS BLOOD GAS, ED - Abnormal; Notable for the following components:   pH, Ven 7.439 (*)    pCO2, Ven 36.3 (*)    pO2, Ven 99 (*)    Calcium, Ion 1.05 (*)    HCT 31.0 (*)    Hemoglobin 10.5 (*)    All other components within normal limits  ETHANOL  BLOOD GAS, VENOUS  URINALYSIS, W/ REFLEX TO CULTURE (INFECTION SUSPECTED)  RAPID URINE DRUG SCREEN, HOSP PERFORMED    EKG EKG Interpretation  Date/Time:  Monday May 08 2022 08:13:28 EST Ventricular Rate:  91 PR Interval:  205 QRS Duration: 79 QT Interval:  358 QTC Calculation: 441 R Axis:   40 Text Interpretation: Sinus rhythm Probable left atrial enlargement Left ventricular hypertrophy Nonspecific T abnrm, anterolateral leads Confirmed by Pattricia Boss [phone]) on 05/08/2022 12:24:34 PM  Radiology CT Head Wo Contrast  Result Date: 05/08/2022 CLINICAL DATA:  Trauma EXAM: CT HEAD WITHOUT CONTRAST TECHNIQUE: Contiguous axial images were obtained from the base of the skull through the vertex without intravenous contrast. RADIATION  DOSE REDUCTION: This exam was performed according to the departmental dose-optimization program which includes automated exposure control, adjustment of the mA and/or kV according to patient size and/or use of iterative reconstruction technique. COMPARISON:  CT Neck 04/14/22 FINDINGS: Brain: No evidence of acute infarction, hemorrhage, hydrocephalus. Sequela of moderate chronic microvascular ischemic change. 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 Vascular: No hyperdense vessel or unexpected calcification. Skull: Normal. Negative for fracture or focal lesion. Sinuses/Orbits: No middle ear or mastoid effusion. Paranasal sinuses are notable for frothy secretions in the left sphenoid and posterior ethmoid air cells on the right, which can be seen in the setting of acute sinusitis. There is a chronic fracture of the right lamina papyracea. Other: Soft tissue thickening at the base of the left tympanic membrane. While this likely represents impacted cerumen, correlation with physical exam is recommended to exclude the possibility of an underlying mass lesion, such as a cholesteatoma. IMPRESSION: 1. No CT evidence of intracranial injury. 2. Frothy secretions in the left sphenoid and posterior ethmoid air cells on the right, which can be seen in the setting of acute sinusitis. 3. Soft tissue thickening at the base of the left tympanic membrane likely represents impacted cerumen, but correlation with physical exam is recommended to exclude the possibility of an underlying mass lesion, such as a cholesteatoma. Electronically Signed   By: Marin Roberts M.D.   On: 05/08/2022 09:47   DG Shoulder Right  Result Date: 05/08/2022 CLINICAL DATA:  Trauma, pain EXAM: RIGHT SHOULDER - 2+ VIEW COMPARISON:  None Available. FINDINGS: There is comminuted displaced fracture in the lateral shaft of right clavicle. There is 8 mm offset in alignment.  Surgical clips are seen in right side of neck. IMPRESSION: Comminuted displaced fracture is seen in the lateral in the right clavicle. Electronically Signed   By: Elmer Picker M.D.   On: 05/08/2022 09:15   DG Chest Port 1 View  Result Date: 05/08/2022 CLINICAL DATA:  Pain.  Deformity. EXAM: PORTABLE CHEST 1 VIEW COMPARISON:  CXR 04/14/22 FINDINGS: No pleural effusion. No pneumothorax. No focal airspace opacity. Normal cardiac and mediastinal contours. No radiographically apparent displaced rib fractures. See separately dictated shoulder radiograph for additional findings. IMPRESSION: 1. No focal airspace opacity. 2. See separately dictated shoulder radiograph for additional findings. Electronically Signed   By: Marin Roberts M.D.   On: 05/08/2022 09:11    Procedures Procedures    Medications Ordered in ED Medications  levETIRAcetam (KEPPRA) IVPB 1000 mg/100 mL premix (0 mg Intravenous Stopped 05/08/22 1013)    ED Course/ Medical Decision Making/ A&P Clinical Course as of 05/08/22 1241  Mon May 08, 2022  0906 Chest right x-Adelayde Minney reviewed and interpreted clavicle fracture noted [DR]  1044 CBC is reviewed and interpreted and reveals anemia which appears stable from prior [DR]  99991111 Pleat metabolic panel reviewed interpreted significant for CO2 decreased at 20 and calcium 8.4 with hypoalbuminemia [DR]  1044 EtOH reviewed interpreted and not detected [DR]  1044 CT head reviewed interpreted and send no evidence of acute intracranial injury or tendinous abnormality which would have caused seizures today [DR]  1045 Radiologist interpretation of shoulder reviewed concurs with my interpretation [DR]  1226 ET results reviewed after speaking with daughter.  She has been having some pain at the left mandibular angle. There are some secretions in the left sphenoid and posterior ethmoid air cells on the right.  Patient does not have symptoms consistent with acute sinusitis.  Suspect this is secondary to  seizure. [DR]    Clinical Course User Index [DR] Pattricia Boss, MD                             Medical Decision Making Amount and/or Complexity of Data Reviewed Labs: ordered. Radiology: ordered.  Risk Prescription drug management.  Reviewed neurology note from 11/25/2019 patient had a history of subarachnoid hemorrhage and seizures.  History of alcohol dependence. EEG done 11/24/2019 abnormal bifrontal slowing intermittent dysrhythmia can indicate possible seizure focus and at that time noted that they plan to treat with Keppra and sustain sobriety Reviewed last discharge summary patient was admitted for sepsis secondary to UTI and urine grew out Aerococcus species and Enterococcus bacillus he was treated with Zosyn and transition to ampicillin no blood culture growth Reported chronic moderate to severe right hydronephrosis and mild right ureterectasis History of laryngeal cancer status post chemo and radiation in remission Hyponatremia treated with IV fluids CKD with creatinine 1.3 on admission 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 Differential diagnosis is broad and includes but is not limited to seizure disorder, syncope, cardiac event, decompensation from anemia, acute electrolyte abnormality Given history from daughter the patient has had some right sided shaking intermittently over the past weeks to months causing him to fall, this could represent focal seizures that became generalized today. Hemodynamically, patient has been stable here with no reports of  hemodynamics instability or decreased sats.  I would suspect that the episode of not breathing with the daughter weakness was secondary to the clonic activity.  He has been breathing well here and oxygen saturations are at 100% Daughter states these were not like his prior seizures and he has not been drinking for several years.  Patient received Keppra here in the ED and we will restart his Keppra.  He has follow-up with Dr. Brett Fairy tomorrow. With fall and probable seizure, patient did sustain a right clavicle fracture.  Patient has since seen previously by orthopedist.  He has been seen by Dr. Stann Mainland.  He is placed in an immobilizer and is advised regarding follow-up. Patient has not had any further episodes while here in the ED over the past 4 hours. Head CT obtained shows no evidence of acute intracranial abnormality however there is question of cholesteatoma on the left TM or cerumen impaction.  Patient has been followed by ENT and will advise recheck with ENT Also on head CT there is some noted in the sinuses.  Patient does not report any ongoing congestion but is on his baseline clearing of mucus from his trach site.  Not had fever.  This is likely post seizure fluid and does not represent an acute infection. [name] hemoglobin is 8.9.  This is stable from prior.  He patient and daughter aware of ongoing anemia.  Does not appear to be symptomatic from this is normal heart rate and blood pressure has been normal to high No complaints of uti symptoms -recent infection noted 1-episode of altered mental status-suspect seizure restart Keppra 2 fall with #1 and fracture of right clavicle 3 stable anemia 4 history of head neck cancer with question of abnormality at TM will need ENT follow-up       Final Clinical Impression(s) / ED Diagnoses Final diagnoses:  Altered mental status, unspecified altered mental status type  Seizure-like activity ([place])  Anemia, unspecified type  Closed  displaced fracture of acromial end of right  clavicle, initial encounter    Rx / DC Orders ED Discharge Orders          Ordered    levETIRAcetam (KEPPRA) 500 MG tablet  2 times daily        05/08/22 1239              Pattricia Boss, MD 05/08/22 1241

## 2022-05-08 NOTE — ED Triage Notes (Signed)
Pt arrived via [facility] from home. Per EMS, pt fell earlier and hit right shoulder. Pt has right shoulder pain. Per EMS, pt's daughter stated after she helped pt up from the fall, he had a seizure and the daughter lowered him to floor. Pt had a seizure under one minute and was apneic. Pt post tictal and not talking, per daughter that is normal when he is post tictal. Pt arrived diaphretic.

## 2022-05-08 NOTE — Discharge Instructions (Addendum)
Mr. Spikes presented with an episode of altered mental status today.  Given his shaking on the right side and an episode of stiffening, I do suspect that he had a seizure.  He is being restarted on his Keppra.  Please keep your appoint with Dr. Brett Fairy tomorrow. During this episode you suffered a clavicle fracture.  Please use your shoulder immobilizer at all times.  Please use assist for walking as we discussed.  You have previously been seen by Dr. Stann Mainland.  Please call them for follow-up. There was an area in the right ear that was noted by the radiologist.  Please have this followed up with your ENT. Please follow-up with your doctor this week Return if you are having new or worsening symptoms.

## 2022-05-08 NOTE — Progress Notes (Signed)
Orthopedic Tech Progress Note Patient Details:  [name] 1950-05-11 [id number]  Ortho Devices Type of Ortho Device: Arm sling Ortho Device/Splint Location: RUE Ortho Device/Splint Interventions: Ordered, Application, Adjustment   Post Interventions Patient Tolerated: Well Instructions Provided: Poper ambulation with device  [name] A Vonzella Althaus 05/08/2022, 12:22 PM

## 2022-05-09 ENCOUNTER — Ambulatory Visit (INDEPENDENT_AMBULATORY_CARE_PROVIDER_SITE_OTHER): Payer: Medicare Other | Admitting: Neurology

## 2022-05-09 ENCOUNTER — Telehealth: Payer: Self-pay | Admitting: Neurology

## 2022-05-09 ENCOUNTER — Encounter: Payer: Self-pay | Admitting: Family Medicine

## 2022-05-09 ENCOUNTER — Ambulatory Visit (INDEPENDENT_AMBULATORY_CARE_PROVIDER_SITE_OTHER): Payer: Medicare Other | Admitting: Family Medicine

## 2022-05-09 ENCOUNTER — Encounter: Payer: Self-pay | Admitting: Neurology

## 2022-05-09 VITALS — BP 149/91 | HR 85 | Ht 71.0 in | Wt 105.4 lb

## 2022-05-09 VITALS — BP 83/51 | HR 90 | Ht 71.0 in | Wt 106.0 lb

## 2022-05-09 DIAGNOSIS — M542 Cervicalgia: Secondary | ICD-10-CM | POA: Diagnosis not present

## 2022-05-09 DIAGNOSIS — G40009 Localization-related (focal) (partial) idiopathic epilepsy and epileptic syndromes with seizures of localized onset, not intractable, without status epilepticus: Secondary | ICD-10-CM | POA: Insufficient documentation

## 2022-05-09 DIAGNOSIS — I6522 Occlusion and stenosis of left carotid artery: Secondary | ICD-10-CM | POA: Diagnosis not present

## 2022-05-09 DIAGNOSIS — C321 Malignant neoplasm of supraglottis: Secondary | ICD-10-CM

## 2022-05-09 DIAGNOSIS — G312 Degeneration of nervous system due to alcohol: Secondary | ICD-10-CM

## 2022-05-09 DIAGNOSIS — I10 Essential (primary) hypertension: Secondary | ICD-10-CM

## 2022-05-09 DIAGNOSIS — R296 Repeated falls: Secondary | ICD-10-CM

## 2022-05-09 DIAGNOSIS — E43 Unspecified severe protein-calorie malnutrition: Secondary | ICD-10-CM

## 2022-05-09 DIAGNOSIS — F1020 Alcohol dependence, uncomplicated: Secondary | ICD-10-CM

## 2022-05-09 DIAGNOSIS — M87052 Idiopathic aseptic necrosis of left femur: Secondary | ICD-10-CM

## 2022-05-09 DIAGNOSIS — R269 Unspecified abnormalities of gait and mobility: Secondary | ICD-10-CM

## 2022-05-09 DIAGNOSIS — R569 Unspecified convulsions: Secondary | ICD-10-CM | POA: Diagnosis not present

## 2022-05-09 MED ORDER — LEVETIRACETAM 500 MG PO TABS: 500.0000 mg | ORAL_TABLET | Freq: Two times a day (BID) | ORAL | 5 refills | Status: DC

## 2022-05-09 NOTE — Addendum Note (Signed)
Addended by: Gerline Legacy C on: 05/09/2022 11:30 AM   Modules accepted: Orders

## 2022-05-09 NOTE — Patient Instructions (Signed)
Levetiracetam Tablets What is this medication? LEVETIRACETAM (lee ve tye RA se tam) prevents and controls seizures in people with epilepsy. It works by calming overactive nerves in your body. This medicine may be used for other purposes; ask your health care provider or pharmacist if you have questions. COMMON BRAND NAME(S): Keppra, Roweepra What should I tell my care team before I take this medication? They need to know if you have any of these conditions: Kidney disease Suicidal thoughts, plans, or attempt by you or a family member An unusual or allergic reaction to levetiracetam, other medications, foods, dyes, or preservatives Pregnant or trying to get pregnant Breast-feeding How should I use this medication? Take this medication by mouth with a glass of water. Follow the directions on the prescription label. Swallow the tablets whole. Do not crush or chew this medication. You may take this medication with or without food. Take your doses at regular intervals. Do not take your medication more often than directed. Do not stop taking this medication or any of your seizure medications unless instructed by your care team. Stopping your medication suddenly can increase your seizures or their severity. A special MedGuide will be given to you by the pharmacist with each prescription and refill. Be sure to read this information carefully each time. Contact your care team about the use of this medication in children. While this medication may be prescribed for children as young as 54 years of age for selected conditions, precautions do apply. Overdosage: If you think you have taken too much of this medicine contact a poison control center or emergency room at once. NOTE: This medicine is only for you. Do not share this medicine with others. What if I miss a dose? If you miss a dose, take it as soon as you can. If it is almost time for your next dose, take only that dose. Do not take double or extra  doses. What may interact with this medication? This medication may interact with the following: Carbamazepine Colesevelam Probenecid Sevelamer This list may not describe all possible interactions. Give your health care provider a list of all the medicines, herbs, non-prescription drugs, or dietary supplements you use. Also tell them if you smoke, drink alcohol, or use illegal drugs. Some items may interact with your medicine. What should I watch for while using this medication? Visit your care team for a regular check on your progress. Wear a medical identification bracelet or chain to say you have epilepsy, and carry a card that lists all your medications. This medication may cause serious skin reactions. They can happen weeks to months after starting the medication. Contact your care team right away if you notice fevers or flu-like symptoms with a rash. The rash may be red or purple and then turn into blisters or peeling of the skin. You may also notice a red rash with swelling of the face, lips, or lymph nodes in your neck or under your arms. It is important to take this medication exactly as instructed by your care team. When first starting treatment, your dose may need to be adjusted. It may take weeks or months before your dose is stable. You should contact your care team if your seizures get worse or if you have any new types of seizures. This medication may affect your coordination, reaction time, or judgment. Do not drive or operate machinery until you know how this medication affects you. Sit up or stand slowly to reduce the risk of dizzy or fainting  spells. Drinking alcohol with this medication can increase the risk of these side effects. This medication may cause thoughts of suicide or depression. This includes sudden changes in mood, behaviors, or thoughts. These changes can happen at any time but are more common in the beginning of treatment or after a change in dose. Call your care team  right away if you experience these thoughts or worsening depression. If you become pregnant while using this medication, you may enroll in the [place] Pregnancy Registry by calling [phone]. This registry collects information about the safety of antiepileptic medication use during pregnancy. What side effects may I notice from receiving this medication? Side effects that you should report to your care team as soon as possible: Allergic reactions or angioedema--skin rash, itching or hives, swelling of the face, eyes, lips, tongue, arms, or legs, trouble swallowing or breathing Increase in blood pressure in children Infection--fever, chills, cough, or sore throat Loss of balance or coordination Low red blood cell level--unusual weakness or fatigue, dizziness, headache, trouble breathing Mood and behavior changes--anxiety, nervousness, confusion, hallucinations, irritability, hostility, thoughts of suicide or self-harm, worsening mood, feelings of depression Rash, fever, and swollen lymph nodes Redness, swelling, and blistering of the skin over hands and feet Trouble walking Unusual bruising or bleeding Unusual weakness or fatigue Side effects that usually do not require medical attention (report these to your care team if they continue or are bothersome): Dizziness Drowsiness Fatigue Irritability Loss of appetite This list may not describe all possible side effects. Call your doctor for medical advice about side effects. You may report side effects to FDA at 1-800-FDA-1088. Where should I keep my medication? Keep out of reach of children. Store at room temperature between 15 and 30 degrees C (59 and 86 degrees F). Throw away any unused medication after the expiration date. NOTE: This sheet is a summary. It may not cover all possible information. If you have questions about this medicine, talk to your doctor, pharmacist, or health care provider.  2023  Elsevier/Gold Standard (2004-04-26 00:00:00)

## 2022-05-09 NOTE — Progress Notes (Unsigned)
    SUBJECTIVE:   CHIEF COMPLAINT / HPI:   ER follow-up - Yesterday and this morning was having significant difficulties with all ADLs - Is now feeling a bit better and able to walk this afternoon - Family feels like he has had less UOP than usual (1L from yesterday morning to this morning)  PERTINENT  PMH / PSH: ***  OBJECTIVE:   BP (!) 174/103   Pulse 85   Ht '5\' 11"'$  (1.803 m)   Wt 105 lb 6.4 oz (47.8 kg)   SpO2 100%   BMI 14.70 kg/m   ***  ASSESSMENT/PLAN:   No problem-specific Assessment & Plan notes found for this encounter.     [name], [place]

## 2022-05-09 NOTE — Telephone Encounter (Signed)
Orders placed for home health PT/OT

## 2022-05-09 NOTE — Telephone Encounter (Signed)
The patient is already active with [facility]: His Emily Date was 04/20/2022. He was a patient I received from the hospital.
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
## 2022-05-09 NOTE — Patient Instructions (Signed)
For the pain in his neck, I would recommend at this point doing a trial of cutting up some of the over-the-counter lidocaine patches to see if that helps anything.  We are significantly limited with the amount of issues that he has and the pros and cons of starting new medications.  I would want him to be seen by vascular before started something that could affect him systemically.

## 2022-05-09 NOTE — Telephone Encounter (Signed)
Can you put in a home health referral for [facility] and put PT and OT on it please instead of the outpatient OT and PT referrals. Thanks!

## 2022-05-10 ENCOUNTER — Other Ambulatory Visit: Payer: Self-pay

## 2022-05-10 ENCOUNTER — Telehealth: Payer: Self-pay

## 2022-05-10 DIAGNOSIS — I6522 Occlusion and stenosis of left carotid artery: Secondary | ICD-10-CM

## 2022-05-10 NOTE — Assessment & Plan Note (Signed)
Had jacksonian march seizure, seen by neurology and restarted on Keppra.

## 2022-05-10 NOTE — Assessment & Plan Note (Signed)
BP elevated in office. No medications at this time given the labile blood pressures (was hypotensive at another medical office morning of the visit). Patient at high risk for falls and is very frail. - Recommend increasing protein content in diet - Continue to monitor

## 2022-05-10 NOTE — Telephone Encounter (Signed)
Received call from [facility], [place] regarding concerns with patient. She reports that during visit today patient had orthostatic hypotension with the following vital signs.   Sitting BP: 100/60 Standing: 60/40  Patient does experience lightheadedness with standing. Denies chest pain or SHOB. PT reports that patient appears comfortable at rest.   Spoke directly with Dr. Oleh Genin regarding concern. No additional medication changes/orders. Advised that patient wear compression stockings.   Provided with provider instructions and return precautions.   Talbot Grumbling, RN

## 2022-05-10 NOTE — Assessment & Plan Note (Signed)
Continues to not have any pain reported, will monitor.

## 2022-05-10 NOTE — Assessment & Plan Note (Signed)
Persistent pain that is affecting patient's appetite as mastication causes pain. ENT evaluation was negative for findings for intervention on their end. Patient does have history of vascular issues in this region per family. Unclear etiology of pain but feel that evaluation with vascular surgery is the best starting point. - Continue voltaren gel - Use lidocaine patches PRN - Encourage protein shakes if avoiding mastication from pain - Follow-up with vascular surgery
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
## 2022-05-12 ENCOUNTER — Encounter: Payer: Self-pay | Admitting: Vascular Surgery

## 2022-05-12 ENCOUNTER — Ambulatory Visit (HOSPITAL_COMMUNITY)
Admission: RE | Admit: 2022-05-12 | Discharge: 2022-05-12 | Disposition: A | Discharge status: Home / Self Care | Payer: Medicare Other | Source: Ambulatory Visit | Attending: Vascular Surgery | Admitting: Vascular Surgery

## 2022-05-12 ENCOUNTER — Other Ambulatory Visit: Payer: Self-pay

## 2022-05-12 ENCOUNTER — Ambulatory Visit (INDEPENDENT_AMBULATORY_CARE_PROVIDER_SITE_OTHER): Payer: Medicare Other | Admitting: Vascular Surgery

## 2022-05-12 VITALS — BP 119/78 | HR 86 | Temp 98.1°F | Resp 20 | Ht 71.0 in | Wt 105.0 lb

## 2022-05-12 DIAGNOSIS — I6521 Occlusion and stenosis of right carotid artery: Secondary | ICD-10-CM | POA: Diagnosis not present

## 2022-05-12 DIAGNOSIS — I6522 Occlusion and stenosis of left carotid artery: Secondary | ICD-10-CM

## 2022-05-16 ENCOUNTER — Other Ambulatory Visit: Payer: Medicare Other | Admitting: *Deleted

## 2022-05-16 ENCOUNTER — Telehealth: Payer: Self-pay | Admitting: *Deleted

## 2022-05-17 ENCOUNTER — Telehealth: Payer: Self-pay

## 2022-05-17 NOTE — Telephone Encounter (Signed)
     Patient  visit on 3/4  at [facility]   Have you been able to follow up with your primary care physician? Yes   The patient was or was not able to obtain any needed medicine or equipment. Yes   Are there diet recommendations that you are having difficulty following? Na   Patient expresses understanding of discharge instructions and education provided has no other needs at this time.  Yes      [name] [facility] Care Guide, [place] [phone] [address], [place], [place] [address] Phone: [phone] Email: [name]    

## 2022-05-18 DIAGNOSIS — S42031A Displaced fracture of lateral end of right clavicle, initial encounter for closed fracture: Secondary | ICD-10-CM | POA: Diagnosis not present

## 2022-05-23 DIAGNOSIS — H35013 Changes in retinal vascular appearance, bilateral: Secondary | ICD-10-CM | POA: Diagnosis not present

## 2022-05-23 DIAGNOSIS — H463 Toxic optic neuropathy: Secondary | ICD-10-CM | POA: Diagnosis not present

## 2022-05-23 DIAGNOSIS — H25813 Combined forms of age-related cataract, bilateral: Secondary | ICD-10-CM | POA: Diagnosis not present

## 2022-05-23 DIAGNOSIS — H35033 Hypertensive retinopathy, bilateral: Secondary | ICD-10-CM | POA: Diagnosis not present

## 2022-05-24 ENCOUNTER — Encounter (HOSPITAL_COMMUNITY): Payer: Self-pay | Admitting: Radiology

## 2022-05-24 ENCOUNTER — Emergency Department (HOSPITAL_COMMUNITY): Payer: Medicare Other

## 2022-05-24 ENCOUNTER — Encounter: Payer: Self-pay | Admitting: Family Medicine

## 2022-05-24 ENCOUNTER — Emergency Department (HOSPITAL_COMMUNITY)
Admission: EM | Admit: 2022-05-24 | Discharge: 2022-05-24 | Disposition: A | Discharge status: Home / Self Care | Payer: Medicare Other | Attending: Emergency Medicine | Admitting: Emergency Medicine

## 2022-05-24 DIAGNOSIS — Z79899 Other long term (current) drug therapy: Secondary | ICD-10-CM | POA: Diagnosis not present

## 2022-05-24 DIAGNOSIS — W19XXXA Unspecified fall, initial encounter: Secondary | ICD-10-CM | POA: Insufficient documentation

## 2022-05-24 DIAGNOSIS — H463 Toxic optic neuropathy: Secondary | ICD-10-CM | POA: Insufficient documentation

## 2022-05-24 DIAGNOSIS — I951 Orthostatic hypotension: Secondary | ICD-10-CM

## 2022-05-24 DIAGNOSIS — S01111A Laceration without foreign body of right eyelid and periocular area, initial encounter: Secondary | ICD-10-CM | POA: Insufficient documentation

## 2022-05-24 DIAGNOSIS — N390 Urinary tract infection, site not specified: Secondary | ICD-10-CM | POA: Insufficient documentation

## 2022-05-24 DIAGNOSIS — Z7982 Long term (current) use of aspirin: Secondary | ICD-10-CM | POA: Insufficient documentation

## 2022-05-24 DIAGNOSIS — R55 Syncope and collapse: Secondary | ICD-10-CM | POA: Diagnosis not present

## 2022-05-24 DIAGNOSIS — S42031A Displaced fracture of lateral end of right clavicle, initial encounter for closed fracture: Secondary | ICD-10-CM | POA: Diagnosis not present

## 2022-05-24 DIAGNOSIS — R531 Weakness: Secondary | ICD-10-CM | POA: Diagnosis not present

## 2022-05-24 DIAGNOSIS — Z043 Encounter for examination and observation following other accident: Secondary | ICD-10-CM | POA: Diagnosis not present

## 2022-05-24 DIAGNOSIS — D320 Benign neoplasm of cerebral meninges: Secondary | ICD-10-CM | POA: Diagnosis not present

## 2022-05-24 DIAGNOSIS — Z23 Encounter for immunization: Secondary | ICD-10-CM | POA: Diagnosis not present

## 2022-05-24 DIAGNOSIS — S00201A Unspecified superficial injury of right eyelid and periocular area, initial encounter: Secondary | ICD-10-CM | POA: Diagnosis present

## 2022-05-24 LAB — URINALYSIS, ROUTINE W REFLEX MICROSCOPIC
Bilirubin Urine: NEGATIVE
Glucose, UA: NEGATIVE mg/dL
Ketones, ur: NEGATIVE mg/dL
Nitrite: POSITIVE — AB
Protein, ur: NEGATIVE mg/dL
Specific Gravity, Urine: 1.015 (ref 1.005–1.030)
pH: 6 (ref 5.0–8.0)

## 2022-05-24 LAB — CBC
HCT: 33.4 % — ABNORMAL LOW (ref 39.0–52.0)
Hemoglobin: 10.1 g/dL — ABNORMAL LOW (ref 13.0–17.0)
MCH: 25.3 pg — ABNORMAL LOW (ref 26.0–34.0)
MCHC: 30.2 g/dL (ref 30.0–36.0)
MCV: 83.5 fL (ref 80.0–100.0)
Platelets: 332 10*3/uL (ref 150–400)
RBC: 4 MIL/uL — ABNORMAL LOW (ref 4.22–5.81)
RDW: 16.8 % — ABNORMAL HIGH (ref 11.5–15.5)
WBC: 5.2 10*3/uL (ref 4.0–10.5)
nRBC: 0 % (ref 0.0–0.2)

## 2022-05-24 LAB — BASIC METABOLIC PANEL
Anion gap: 3 — ABNORMAL LOW (ref 5–15)
BUN: 29 mg/dL — ABNORMAL HIGH (ref 8–23)
CO2: 27 mmol/L (ref 22–32)
Calcium: 8.8 mg/dL — ABNORMAL LOW (ref 8.9–10.3)
Chloride: 106 mmol/L (ref 98–111)
Creatinine, Ser: 0.92 mg/dL (ref 0.61–1.24)
GFR, Estimated: >60 mL/min (ref >60)
Glucose, Bld: 93 mg/dL (ref 70–99)
Potassium: 4.2 mmol/L (ref 3.5–5.1)
Sodium: 136 mmol/L (ref 135–145)

## 2022-05-24 LAB — CBG MONITORING, ED: Glucose-Capillary: 85 mg/dL (ref 70–99)

## 2022-05-24 MED ORDER — TETANUS-DIPHTH-ACELL PERTUSSIS 5-2.5-18.5 LF-MCG/0.5 IM SUSY
0.5000 mL | PREFILLED_SYRINGE | Freq: Once | INTRAMUSCULAR | Status: AC
  Administered 2022-05-24: 0.5 mL via INTRAMUSCULAR
  Filled 2022-05-24: qty 0.5

## 2022-05-24 MED ORDER — CEFTRIAXONE SODIUM 1 G IJ SOLR: 1.0000 g | Freq: Once | INTRAMUSCULAR | Status: DC

## 2022-05-24 MED ORDER — SODIUM CHLORIDE 0.9 % IV BOLUS
500.0000 mL | Freq: Once | INTRAVENOUS | Status: AC
  Administered 2022-05-24: 500 mL via INTRAVENOUS

## 2022-05-24 MED ORDER — CEPHALEXIN 500 MG PO CAPS: 500.0000 mg | ORAL_CAPSULE | Freq: Four times a day (QID) | ORAL | 0 refills | Status: DC

## 2022-05-24 MED ORDER — SODIUM CHLORIDE 0.9 % IV BOLUS
1000.0000 mL | Freq: Once | INTRAVENOUS | Status: AC
  Administered 2022-05-24: 1000 mL via INTRAVENOUS

## 2022-05-24 MED ORDER — SODIUM CHLORIDE 0.9 % IV SOLN
1.0000 g | Freq: Once | INTRAVENOUS | Status: AC
  Administered 2022-05-24: 1 g via INTRAVENOUS
  Filled 2022-05-24: qty 10

## 2022-05-24 NOTE — ED Notes (Signed)
Called daughter [name] and she is coming to pick up the patient. Updated on discharge instructions and all questions answered.

## 2022-05-24 NOTE — ED Provider Notes (Signed)
[place] [facility]MENT AT [facility] Provider Note   CSN: [id number] Arrival date & time: 05/24/22  [id number]     History  Chief Complaint  Patient presents with   Fall   Laceration    [name] Artimus Gruver is a 72 y.o. male. History of prior tracheostomy, seizures.  Patient brought in by EMS today for fall with laceration to the right eyebrow area.  Patient is able to speak minimally due to prior tracheostomy but is able to communicate very short sentences, he reportedly fell from bed when he tried to stand up and states he blacked out.   Fall  Laceration      Home Medications Prior to Admission medications   Medication Sig Start Date End Date Taking? Authorizing Provider  acetic acid-hydrocortisone (VOSOL-HC) OTIC solution Place 4 drops into the left ear 2 (two) times daily.    [provider]  aspirin EC 81 MG tablet Take 81 mg by mouth daily. Swallow whole.    [provider]  cyanocobalamin (VITAMIN B12) 500 MCG tablet Take 2 tablets (1,000 mcg total) by mouth daily. 04/18/22   Regalado, Belkys A, MD  diclofenac Sodium (VOLTAREN) 1 % GEL Apply 2 g topically 2 (two) times daily as needed (pain).    [provider]  guaiFENesin (MUCINEX) 600 MG 12 hr tablet Take 600 mg by mouth 2 (two) times daily as needed for cough or to loosen phlegm.    [provider]  icosapent Ethyl (VASCEPA) 1 g capsule Take 2 capsules (2 g total) by mouth 2 (two) times daily. 04/18/22   Regalado, Belkys A, MD  levETIRAcetam (KEPPRA) 500 MG tablet Take 1 tablet (500 mg total) by mouth 2 (two) times daily. 05/09/22   Dohmeier, Asencion Partridge, MD  levothyroxine (SYNTHROID) 137 MCG tablet TAKE 1 TABLET(137 MCG) BY MOUTH DAILY BEFORE BREAKFAST Patient taking differently: Take 137 mcg by mouth daily before breakfast. 03/20/22   [name], [name], DO  potassium chloride SA (KLOR-CON M) 20 MEQ tablet Take 2 tablets (40 mEq total) by mouth daily for 3 days. 04/18/22  04/21/22  Regalado, Jerald Kief A, MD  Tamsulosin HCl (FLOMAX PO) Take by mouth.    [provider]      Allergies    Succinylcholine, Tylenol [acetaminophen], Vicodin [hydrocodone-acetaminophen], Other, and Protonix [pantoprazole]    Review of Systems   Review of Systems  Physical Exam Updated Vital Signs BP (!) 158/89   Pulse 88   Temp 97.7 F (36.5 C) (Oral)   Resp 18   SpO2 97%  Physical Exam Constitutional:      Appearance: Normal appearance.  HENT:     Head: Normocephalic.     Comments: Small swelling to right medial eyebrow    Nose: Nose normal.     Mouth/Throat:     Mouth: Mucous membranes are moist.  Neck:     Comments: Tracheostomy noted, no cannula in place Cardiovascular:     Rate and Rhythm: Normal rate and regular rhythm.  Abdominal:     General: Abdomen is flat. There is no distension.     Palpations: Abdomen is soft.     Tenderness: There is no abdominal tenderness.  Musculoskeletal:     Cervical back: Normal range of motion.     Right lower leg: No edema.     Left lower leg: No edema.     Comments: Right arm is in a sling.  Tenderness over right distal clavicle.  No skin tenting overlying.  Radial  pulses 2+ in the right wrist.  Sensation grossly intact in right upper extremity  Skin:    Capillary Refill: Capillary refill takes less than 2 seconds.     Comments: 0.5 cm laceration linear right medial orbital ridge  Neurological:     General: No focal deficit present.     Mental Status: He is alert.     ED Results / Procedures / Treatments   Labs (all labs ordered are listed, but only abnormal results are displayed) Labs Reviewed  BASIC METABOLIC PANEL - Abnormal; Notable for the following components:      Result Value   BUN 29 (*)    Calcium 8.8 (*)    Anion gap 3 (*)    All other components within normal limits  CBC - Abnormal; Notable for the following components:   RBC 4.00 (*)    Hemoglobin 10.1 (*)    HCT 33.4 (*)    MCH 25.3 (*)     RDW 16.8 (*)    All other components within normal limits  URINALYSIS, ROUTINE W REFLEX MICROSCOPIC - Abnormal; Notable for the following components:   APPearance HAZY (*)    Hgb urine dipstick SMALL (*)    Nitrite POSITIVE (*)    Leukocytes,Ua LARGE (*)    Bacteria, UA RARE (*)    All other components within normal limits  URINE CULTURE  CBG MONITORING, ED    EKG None  Radiology DG Clavicle Right  Result Date: 05/24/2022 CLINICAL DATA:  Pain after fall EXAM: RIGHT CLAVICLE - 3 VIEWS COMPARISON:  X-ray 05/08/2022 FINDINGS: Once again there is a comminuted fracture of the extreme distal clavicle. The main distal fracture fragment today is displaced inferior to the proximal by 1 shaft width and there is some overriding by approximately 1 shaft width as well. Displacement is similar. There is slightly more overriding today. No additional fracture or dislocation. Preserved joint spaces. Osteopenia. Surgical clips seen along the neck of the edge of the imaging field. IMPRESSION: Persistent findings of the displaced distal right clavicle fracture. Slightly more overriding of main fragments compared to prior Electronically Signed   By: Jill Side M.D.   On: 05/24/2022 13:05   CT Head Wo Contrast  Result Date: 05/24/2022 CLINICAL DATA:  Fall this morning with right supraorbital laceration. EXAM: CT HEAD WITHOUT CONTRAST CT CERVICAL SPINE WITHOUT CONTRAST TECHNIQUE: Multidetector CT imaging of the head and cervical spine was performed following the standard protocol without intravenous contrast. Multiplanar CT image reconstructions of the cervical spine were also generated. RADIATION DOSE REDUCTION: This exam was performed according to the departmental dose-optimization program which includes automated exposure control, adjustment of the mA and/or kV according to patient size and/or use of iterative reconstruction technique. COMPARISON:  CT head 05/08/2022, brain MRI 07/20/2019, CT neck 04/14/2022  FINDINGS: CT HEAD FINDINGS Brain: There is no acute intracranial hemorrhage, extra-axial fluid collection, or acute infarct. Parenchymal volume is within normal limits for age. The ventricles are stable in size. 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 The partially calcified planum sphenoidale meningioma is unchanged, without mass effect on the underlying parenchyma or parenchymal edema. There is no other mass lesion. There is no midline shift. The pituitary and suprasellar region are normal. Vascular: There is calcification of the bilateral carotid siphons and vertebral arteries. Skull: Normal. Negative for fracture or focal lesion. Sinuses/Orbits: There is a remote fracture of the right lamina papyracea. The globes and orbits are  otherwise unremarkable. The imaged paranasal sinuses are clear. Other: Soft tissue density along the floor of the left external auditory canal is unchanged since the recent prior study but decreased since 2021. A small focus of soft tissue gas in the right supraorbital region likely corresponds to the reported laceration. CT CERVICAL SPINE FINDINGS Alignment: There is unchanged straightening and slight reversal of the normal cervical lordosis. There is no significant antero or retrolisthesis. There is no jumped or perched facet or other evidence of traumatic malalignment. Skull base and vertebrae: Mild anterior wedge deformity of the T1 vertebral body is unchanged. The other vertebral body heights are preserved. There is no evidence of acute fracture. There is no suspicious osseous lesion. Soft tissues and spinal canal: There is no visible canal hematoma. Retropharyngeal soft tissue thickening and a possible effusion are similar to the CT neck from 04/14/2022. There is no definite superimposed acute prevertebral soft tissue swelling. Disc levels: There is disc space narrowing and degenerative endplate change most  advanced at C3-C4, C5-C6, and C6-C7. There is no high-grade spinal canal or neural foraminal stenosis. Upper chest: There is biapical scarring. The right upper lobe nodule seen on the recent CT neck is not seen on the current study, possibly outside the field of view. Other: Postsurgical changes in the neck reflecting laryngectomy and bilateral neck dissection are again seen, evaluated in full on the recent CT neck. The TEP device is stable in position. IMPRESSION: 1. Stable noncontrast head CT with no acute intracranial pathology. 2. Small focus of soft tissue gas in the right supraorbital region likely corresponds to the reported laceration. 3. No acute fracture or traumatic malalignment of the cervical spine. Electronically Signed   By: Valetta Mole M.D.   On: 05/24/2022 11:03   CT Cervical Spine Wo Contrast  Result Date: 05/24/2022 CLINICAL DATA:  Fall this morning with right supraorbital laceration. EXAM: CT HEAD WITHOUT CONTRAST CT CERVICAL SPINE WITHOUT CONTRAST TECHNIQUE: Multidetector CT imaging of the head and cervical spine was performed following the standard protocol without intravenous contrast. Multiplanar CT image reconstructions of the cervical spine were also generated. RADIATION DOSE REDUCTION: This exam was performed according to the departmental dose-optimization program which includes automated exposure control, adjustment of the mA and/or kV according to patient size and/or use of iterative reconstruction technique. COMPARISON:  CT head 05/08/2022, brain MRI 07/20/2019, CT neck 04/14/2022 FINDINGS: CT HEAD FINDINGS Brain: There is no acute intracranial hemorrhage, extra-axial fluid collection, or acute infarct. Parenchymal volume is within normal limits for age. The ventricles are stable in size. 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 The partially calcified planum sphenoidale meningioma is unchanged, without mass effect  on the underlying parenchyma or parenchymal edema. There is no other mass lesion. There is no midline shift. The pituitary and suprasellar region are normal. Vascular: There is calcification of the bilateral carotid siphons and vertebral arteries. Skull: Normal. Negative for fracture or focal lesion. Sinuses/Orbits: There is a remote fracture of the right lamina papyracea. The globes and orbits are otherwise unremarkable. The imaged paranasal sinuses are clear. Other: Soft tissue density along the floor of the left external auditory canal is unchanged since the recent prior study but decreased since 2021. A small focus of soft tissue gas in the right supraorbital region likely corresponds to the reported laceration. CT CERVICAL SPINE FINDINGS Alignment: There is unchanged straightening and slight reversal of the normal cervical lordosis. There is no significant antero or  retrolisthesis. There is no jumped or perched facet or other evidence of traumatic malalignment. Skull base and vertebrae: Mild anterior wedge deformity of the T1 vertebral body is unchanged. The other vertebral body heights are preserved. There is no evidence of acute fracture. There is no suspicious osseous lesion. Soft tissues and spinal canal: There is no visible canal hematoma. Retropharyngeal soft tissue thickening and a possible effusion are similar to the CT neck from 04/14/2022. There is no definite superimposed acute prevertebral soft tissue swelling. Disc levels: There is disc space narrowing and degenerative endplate change most advanced at C3-C4, C5-C6, and C6-C7. There is no high-grade spinal canal or neural foraminal stenosis. Upper chest: There is biapical scarring. The right upper lobe nodule seen on the recent CT neck is not seen on the current study, possibly outside the field of view. Other: Postsurgical changes in the neck reflecting laryngectomy and bilateral neck dissection are again seen, evaluated in full on the recent CT  neck. The TEP device is stable in position. IMPRESSION: 1. Stable noncontrast head CT with no acute intracranial pathology. 2. Small focus of soft tissue gas in the right supraorbital region likely corresponds to the reported laceration. 3. No acute fracture or traumatic malalignment of the cervical spine. Electronically Signed   By: Valetta Mole M.D.   On: 05/24/2022 11:03    Procedures .Marland KitchenLaceration Repair  Date/Time: 05/24/2022 3:37 PM  Performed by: Gwenevere Abbot, PA-C Authorized by: Gwenevere Abbot, PA-C   Consent:    Consent obtained:  Verbal   Consent given by:  Patient Laceration details:    Location:  Face   Face location:  R eyebrow   Length (cm):  0.5 Exploration:    Limited defect created (wound extended): no     Contaminated: no   Treatment:    Area cleansed with:  Saline   Amount of cleaning:  Standard   Irrigation solution:  Sterile saline   Irrigation method:  Syringe   Debridement:  None Skin repair:    Repair method:  Tissue adhesive Approximation:    Approximation:  Close Repair type:    Repair type:  Simple     Medications Ordered in ED Medications  sodium chloride 0.9 % bolus 500 mL (0 mLs Intravenous Stopped 05/24/22 1153)  Tdap (BOOSTRIX) injection 0.5 mL (0.5 mLs Intramuscular Given 05/24/22 1300)  cefTRIAXone (ROCEPHIN) 1 g in sodium chloride 0.9 % 100 mL IVPB (0 g Intravenous Stopped 05/24/22 1400)  sodium chloride 0.9 % bolus 1,000 mL (1,000 mLs Intravenous New Bag/Given 05/24/22 1532)    ED Course/ Medical Decision Making/ A&P Clinical Course as of 05/24/22 1539  Wed May 24, 2022  1235 Presented today after fall reported loss of consciousness after standing up this morning.  Much history is taken from his family member [name] over the phone via his caregiver who is at bedside.  She states that her wife was home and her daughter.  They reported that this was unwitnessed, they found him on the ground and suspect he struck his head on the  nightstand.  They state they got him in the bed and he seemed to be a little bit "dazed" but then quickly came back to his usual.  She states she does not believe this is a seizure.  She has not seen him have a seizure like his usual in a long time.  He was recently admitted for sepsis with UTI and reported seizure but she states that she did not believe  that that was a seizure at that time either.    She states he is having dizziness and low blood pressure with standing for the past couple weeks intermittently. [CB]    Clinical Course User Index [CB] Gwenevere Abbot, PA-C                             Medical Decision Making This patient presents to the ED for concern of fall, head injury with reported LOC, this involves an extensive number of treatment options, and is a complaint that carries with it a high risk of complications and morbidity.  The differential diagnosis includes syncope, seizure, intracranial hemorrhage, fracture, sprain, other   Co morbidities that complicate the patient evaluation  Hx tracheotomy, history of seizures   Additional history obtained:  Additional history obtained from EMR External records from outside source obtained and reviewed including recent discharge summary, ED visit from 03/04, prior urine culture results showing Aerococcus   Lab Tests:  I Ordered, and personally interpreted labs.  The pertinent results include: Urinalysis significant for UTI, positive nitrate, large leuks, 21-50 white blood cells with rare bacteria and 0-5 squamous cells CBC shows hemoglobin at patient's baseline at 10.1, BMP shows UN elevated at 29, creatinine is normal   Imaging Studies ordered:  I ordered imaging studies including CT head and C-spine I independently visualized and interpreted imaging which showed hemorrhage, no fracture or misalignment of skull or C-spine.  X-ray right clavicle shows comminuted displaced distal clavicle fracture with slightly increased  overriding fragments I agree with the radiologist interpretation   Cardiac Monitoring: / EKG:  The patient was maintained on a cardiac monitor.  I personally viewed and interpreted the cardiac monitored which showed an underlying rhythm of: This rhythm  Course: Patient presents with syncopal event today, does have a UTI, Rocephin given, culture ordered.  Had UTI recently that showed Aerococcus.  His caregiver states has been having hypotension with standing.  He is orthostatic, given fluids initially and only 500 cc but was still orthostatic after dose so given additional fluids and will reassess and determine if patient can be discharged if he is improved.  He did have elevated BUN suggesting some dehydration as a possible cause. Find out to oncoming team  Amount and/or Complexity of Data Reviewed Labs: ordered. Radiology: ordered.  Risk Prescription drug management.           Final Clinical Impression(s) / ED Diagnoses Final diagnoses:  None    Rx / DC Orders ED Discharge Orders     None         Gwenevere Abbot, PA-C 05/24/22 [place], DO 05/25/22 [phone]

## 2022-05-24 NOTE — ED Triage Notes (Signed)
Pt BIBA for unwitnessed fall. 0.5cm lac above right eye, bleeding controlled. No thinners. Family reports pt has been having BP issues. Pt does not remember falling.  Reports pain to left face and right eyebrow, no other pain. Denies feeling unwell.  122/80 HR 96 99% RA CBG 99

## 2022-05-24 NOTE — ED Provider Notes (Signed)
Patient care assumed from [facility], [place] at shift handoff.  Please see her note for full details  In short, patient is a 72 year old male was brought in by EMS due to a fall with a laceration to the right eyebrow region.  Patient has difficulty speaking due to prior tracheostomy but is able to communicate in short sentences.  Patient reportedly fell from bed when he tried to stand up and states he blacked out.  Workup completed prior to my assumption of care included UA indicative of cystitis, hemoglobin 10.1, grossly unremarkable BMP, normal CBG.  Patient given Tdap, 1500 of saline, Rocephin.  Plan for reassessment post fluids to check for orthostatic changes Physical Exam  BP (!) 170/109   Pulse 87   Temp 97.7 F (36.5 C)   Resp 17   SpO2 100%   Physical Exam  Procedures  Procedures  ED Course / MDM   Clinical Course as of 05/24/22 2134  Wed May 24, 2022  1235 Presented today after fall reported loss of consciousness after standing up this morning.  Much history is taken from his family member [name] over the phone via his caregiver who is at bedside.  She states that her wife was home and her daughter.  They reported that this was unwitnessed, they found him on the ground and suspect he struck his head on the nightstand.  They state they got him in the bed and he seemed to be a little bit "dazed" but then quickly came back to his usual.  She states she does not believe this is a seizure.  She has not seen him have a seizure like his usual in a long time.  He was recently admitted for sepsis with UTI and reported seizure but she states that she did not believe that that was a seizure at that time either.    She states he is having dizziness and low blood pressure with standing for the past couple weeks intermittently. [CB]    Clinical Course User Index [CB] Gwenevere Abbot, PA-C   Medical Decision Making Amount and/or Complexity of Data Reviewed Labs: ordered. Radiology:  ordered.  Risk Prescription drug management.   After fluids were complete patient was reassessed.  Patient had significant orthostatic changes.  [name] blood pressure while lying down was 153/92, up and sitting it dropped to 99/79, standing at 3 minutes it was 98/67.  Patient was administered an additional liter of normal saline.  Patient reevaluated after additional liter of saline.  Orthostatic vital signs much more reassuring.  Patient was steady on his feet.  Patient did not feel lightheaded.  Patient would like to discharge home which seems reasonable at this time.     Ronny Bacon 05/24/22 2134    Audley Hose, MD 05/31/22 [phone]

## 2022-05-24 NOTE — Discharge Instructions (Signed)
You were evaluated today after a syncopal episode. This seems to be related to dehydration. Please follow up with your primary care provider for further evaluation and management.

## 2022-05-26 ENCOUNTER — Ambulatory Visit (INDEPENDENT_AMBULATORY_CARE_PROVIDER_SITE_OTHER): Payer: Medicare Other | Admitting: Family Medicine

## 2022-05-26 ENCOUNTER — Telehealth: Payer: Self-pay

## 2022-05-26 ENCOUNTER — Other Ambulatory Visit: Payer: Self-pay

## 2022-05-26 ENCOUNTER — Other Ambulatory Visit: Payer: Self-pay | Admitting: Family Medicine

## 2022-05-26 ENCOUNTER — Encounter: Payer: Self-pay | Admitting: Family Medicine

## 2022-05-26 VITALS — BP 110/82 | HR 85

## 2022-05-26 DIAGNOSIS — I959 Hypotension, unspecified: Secondary | ICD-10-CM | POA: Diagnosis not present

## 2022-05-26 DIAGNOSIS — R339 Retention of urine, unspecified: Secondary | ICD-10-CM | POA: Diagnosis not present

## 2022-05-26 LAB — URINE CULTURE: Culture: >=100000 — AB

## 2022-05-26 MED ORDER — ICOSAPENT ETHYL 1 G PO CAPS: 2.0000 g | ORAL_CAPSULE | Freq: Two times a day (BID) | ORAL | 0 refills | Status: DC

## 2022-05-26 MED ORDER — FINASTERIDE 5 MG PO TABS: 5.0000 mg | ORAL_TABLET | Freq: Every day | ORAL | 0 refills | Status: DC

## 2022-05-26 NOTE — Assessment & Plan Note (Addendum)
Patient has multiple medical problems that could cause syncope/hypotension (seizure history, UTI, cirrhosis, heart failure, laryngeal cancer, PAI).  Reassuringly does not appear volume overloaded on exam.  Patient does have positive orthostatics both here in clinic and 2 days ago in the ED. Cannot rule out adrenal insufficiency given history requiring hydrocortisone. Reassuringly patient has intermittent dizziness as only baseline, and HR is normal today. Given patient already has home health, patient may need assessment higher level of care in facility. Plan: -Stop Flomax -Endocrine Referral -1 week follow-up -Continue Keppra -Discussed strict return precautions for syncope, chest pain, shortness of breath

## 2022-05-26 NOTE — Assessment & Plan Note (Addendum)
Stop Flomax due to hypotension. Will start finasteride 5mg  daily. Patient has history of UTIs and Urosepsis. Discussed monitor for new urinary symptoms. ED visit notable for urine culture growing staph epidermidis (likely contaminant); however, treated with Rocephin which would not cover staph.

## 2022-05-26 NOTE — Progress Notes (Signed)
    SUBJECTIVE:   CHIEF COMPLAINT / HPI: ED follow-up  Called clinic today saying that BP is 82/48 sitting. Started Flomax 30 days ago. Mainly been having dizziness. Previously diagnosed with syncope, when initially diagnosed with laryngeal neoplasm. Has history of seizures. Had syncope and ED visit on 4th of march. Restarted on Keppra. Saw neurology.   BP at home: 82/48, 89/55, 109/72. Drinking lots of water. Compression stockings on.   Started on Tamsulosin 0.4mg  by urology one month ago.  Currently sees ENT for neoplasm follow-up. Most recent visit January.   Went to ED on 05/24/22 for syncopal event. Treated for UTI. Given fluid boluses. Had positive orthostatics.  Wears compression stockings at home regularly. Has home health pt/ot at home but they stated today they will stop coming for 3 weeks until hypotension has resolved as they are unable to work with him.  PERTINENT  PMH / PSH: HTN, CHF, Larygneal neoplasm, PAI, Hypothyroidism  OBJECTIVE:   BP 110/82   Pulse 85   SpO2 100%   General: NAD, malnourished appearing, sitting in wheelchair, arm in sling Cardiovascular: RRR, no murmurs, no peripheral edema Respiratory: normal WOB on RA, CTAB, no wheezes, ronchi or rales Abdomen: soft, not swollen, NTTP, no rebound or guarding Extremities: Moving all 4 extremities equally  Orthostatic Vitals:  Lying: 123/86 Sitting: 125/82 Standing: 103/65  ASSESSMENT/PLAN:   Hypotension, unspecified hypotension type Assessment & Plan: Patient has multiple medical problems that could cause syncope/hypotension (seizure history, UTI, cirrhosis, heart failure, laryngeal cancer, PAI).  Reassuringly does not appear volume overloaded on exam.  Patient does have positive orthostatics both here in clinic and 2 days ago in the ED. Cannot rule out adrenal insufficiency given history requiring hydrocortisone. Reassuringly patient has intermittent dizziness as only baseline, and HR is normal today.  Given patient already has home health, patient may need assessment higher level of care in facility. Plan: -Stop Flomax -Endocrine Referral -1 week follow-up -Continue Keppra -Discussed strict return precautions for syncope, chest pain, shortness of breath  Orders: -     Ambulatory referral to Endocrinology  Urinary retention Assessment & Plan: Stop Flomax due to hypotension. Will start finasteride 5mg  daily. Patient has history of UTIs and Urosepsis. Discussed monitor for new urinary symptoms. ED visit notable for urine culture growing staph epidermidis (likely contaminant); however, treated with Rocephin which would not cover staph.  Orders: -     Finasteride; Take 1 tablet (5 mg total) by mouth daily.  Dispense: 30 tablet; Refill: 0   Return in about 1 week (around 06/02/2022).  Salvadore Oxford, MD [place]

## 2022-05-26 NOTE — Patient Instructions (Signed)
It was great to see you! Thank you for allowing me to participate in your care!  I recommend that you always bring your medications to each appointment as this makes it easy to ensure we are on the correct medications and helps [place] not miss when refills are needed.  Our plans for today:  - Please make a follow-up appointment in 1 week.  - Please start taking finasteride 5mg  daily for your urinary retention. - I have place a referral to Endocrinology for you, they should call to schedule. -STOP taking Flomax.  Please arrive 15 minutes PRIOR to your next scheduled appointment time! If you do not, this affects OTHER patients' care.  Take care and seek immediate care sooner if you develop any concerns.   Dr. Salvadore Oxford, MD [place]

## 2022-05-26 NOTE — Telephone Encounter (Signed)
Received call from [facility] OT regarding issues with continued low BP.   She states that at visit today, BP was 82/48- sitting. Patient did report slight dizziness. OT reports that patient has been on Flomax for the last 30 days. She has noticed that BP has been running lower since starting this medication.   He has also had multiple falls this month.   Patient has follow up visit this afternoon with Dr. Ruben Im.   Spoke with Dr. Erin Hearing regarding patient. Patient can be seen in office today, given this is not acute onset and if patient is not having mental status changes, chest pain or shortness of breath. If patient is having any of these symptoms, he recommends that they go to the ED asap.    Called and spoke with patient's daughter. Daughter reports that he is alert and oriented, no mental status changes, CP or shortness of breath.   They will follow up in clinic this afternoon at 2:10. Provided with strict ED precautions.   Talbot Grumbling, RN

## 2022-05-27 ENCOUNTER — Telehealth (HOSPITAL_BASED_OUTPATIENT_CLINIC_OR_DEPARTMENT_OTHER): Payer: Self-pay | Admitting: *Deleted

## 2022-05-27 NOTE — Telephone Encounter (Signed)
Post ED Visit - Positive Culture Follow-up: Successful Patient Follow-Up  Culture assessed and recommendations reviewed by:  []  Elenor Quinones, Pharm.D. []  Heide Guile, Pharm.D., BCPS AQ-ID []  Parks Neptune, Pharm.D., BCPS []  Alycia Rossetti, Pharm.D., BCPS []  [place], Pharm.D., BCPS, AAHIVP []  Legrand Como, Pharm.D., BCPS, AAHIVP []  Salome Arnt, PharmD, BCPS []  Johnnette Gourd, PharmD, BCPS []  Hughes Better, PharmD, BCPS [x] Leodis Sias, PharmD  Positive urine culture  []  Patient discharged without antimicrobial prescription and treatment is now indicated [x]  Organism is resistant to prescribed ED discharge antimicrobial []  Patient with positive blood cultures  Changes discussed with ED provider: Louellen Molder, PA-C  Plan: D/C cephalexin. PCP to follow up at appt. 3/29   Unsucessful in all attempts to reach pt. Letter sent to patient.  Rosie Fate 05/27/2022, 4:18 PM

## 2022-05-27 NOTE — Progress Notes (Signed)
ED Antimicrobial Stewardship Positive Culture Follow Up   [name] is an 72 y.o. male who presented to [facility] on 05/24/2022 with a chief complaint of fall Chief Complaint  Patient presents with   Fall   Laceration    Recent Results (from the past 32 hour(s))  Urine Culture (for pregnant, neutropenic or urologic patients or patients with an indwelling urinary catheter)     Status: Abnormal   Collection Time: 05/24/22 11:26 AM   Specimen: Urine, Clean Catch  Result Value Ref Range Status   Specimen Description   Final    URINE, CLEAN CATCH Performed at [facility], [place] [address], [place], [place] [address]    Special Requests   Final    NONE Performed at [facility], [place] [address] [place], [place] [address]    Culture >=100,000 COLONIES/mL STAPHYLOCOCCUS EPIDERMIDIS (A)  Final   Report Status 05/26/2022 FINAL  Final   Organism ID, Bacteria STAPHYLOCOCCUS EPIDERMIDIS (A)  Final      Susceptibility   Staphylococcus epidermidis - MIC*    CIPROFLOXACIN <=0.5 SENSITIVE Sensitive     GENTAMICIN <=0.5 SENSITIVE Sensitive     NITROFURANTOIN <=16 SENSITIVE Sensitive     OXACILLIN 0.5 RESISTANT Resistant     TETRACYCLINE 2 SENSITIVE Sensitive     VANCOMYCIN <=0.5 SENSITIVE Sensitive     TRIMETH/SULFA <=10 SENSITIVE Sensitive     CLINDAMYCIN <=0.25 SENSITIVE Sensitive     RIFAMPIN <=0.5 SENSITIVE Sensitive     Inducible Clindamycin NEGATIVE Sensitive     * >=100,000 COLONIES/mL STAPHYLOCOCCUS EPIDERMIDIS    [x]  Treated with cephalexin, organism resistant to prescribed antimicrobial []  Patient discharged originally without antimicrobial agent and treatment is now indicated  Plan: dc cephalexin PCP to f/u in office 06/02/22, per PCP 3/22 visit> contaminant, f/u for symptoms  ED Provider: Louellen Molder, PA-C  Eudelia Bunch, Pharm.D Use secure chat for questions 05/27/2022 11:08 AM Clinical Pharmacist [phone]

## 2022-05-30 ENCOUNTER — Telehealth: Payer: Self-pay

## 2022-05-30 DIAGNOSIS — S42031S Displaced fracture of lateral end of right clavicle, sequela: Secondary | ICD-10-CM | POA: Diagnosis not present

## 2022-05-30 DIAGNOSIS — M25511 Pain in right shoulder: Secondary | ICD-10-CM | POA: Diagnosis not present

## 2022-05-30 NOTE — Telephone Encounter (Signed)
        Patient  visited [facility] on 05/24/2022  for fall, laceration.   Telephone encounter attempt :  1st  A HIPAA compliant voice message was left requesting a return call.  Instructed patient to call back at [phone].   [place] Resource Care Guide   ??[email or web address]  ?? [id number]   Website: [email or web address]  [email or web address]

## 2022-05-31 ENCOUNTER — Telehealth: Payer: Self-pay

## 2022-05-31 NOTE — Telephone Encounter (Signed)
     Patient  visit on 05/24/2022  at [facility] was for fall, laceration.  Have you been able to follow up with your primary care physician? Yes  The patient was or was not able to obtain any needed medicine or equipment. Patient was able to obtain medication.  Are there diet recommendations that you are having difficulty following? No  Patient expresses understanding of discharge instructions and education provided has no other needs at this time. Yes   [place] Resource Care Guide   [email or web address]  ?? [id number]   Website: [email or web address]  [email or web address]

## 2022-06-01 ENCOUNTER — Ambulatory Visit (INDEPENDENT_AMBULATORY_CARE_PROVIDER_SITE_OTHER): Payer: Medicare Other | Admitting: Family Medicine

## 2022-06-01 ENCOUNTER — Encounter: Payer: Self-pay | Admitting: Family Medicine

## 2022-06-01 VITALS — BP 142/90 | HR 89 | Ht 71.0 in

## 2022-06-01 DIAGNOSIS — I951 Orthostatic hypotension: Secondary | ICD-10-CM

## 2022-06-01 NOTE — Patient Instructions (Addendum)
It was nice seeing you today!  [facility] Address: [address], [place], [place] [address] Phone: [phone]  Stay well, Zola Button, MD [place] [phone]  --  Make sure to check out at the front desk before you leave today.  Please arrive at least 15 minutes prior to your scheduled appointments.  If you had blood work today, I will send you a MyChart message or a letter if results are normal. Otherwise, I will give you a call.  If you had a referral placed, they will call you to set up an appointment. Please give [place] a call if you don't hear back in the next 2 weeks.  If you need additional refills before your next appointment, please call your pharmacy first.

## 2022-06-01 NOTE — Assessment & Plan Note (Addendum)
Ongoing problem with positive orthostatics previously in clinic and in the ED. No significant improvement after discontinuing tamsulosin.  No significant medications that would be contributing.  Probably multifactorial with deconditioning, underlying seizure disorder, and laryngeal cancer.  He will be seeing endocrinology for possible adrenal insufficiency. - endocrinology information provided - continue compression, fluid intake, and liberalizing salt intake

## 2022-06-01 NOTE — Progress Notes (Signed)
    SUBJECTIVE:   CHIEF COMPLAINT / HPI:  Chief Complaint  Patient presents with   Follow-up    Patient was seen in clinic 6 days ago by colleague Dr. Ruben Im for ED follow-up for syncope.  At that visit was having some hypotension felt to be multifactorial.  Tamsulosin was discontinued and he was referred to endocrinology.  Here with daughter and granddaughter.  Today, daughter reports symptoms remain about the same. Still having orthostatic hypotension when sitting up and standing up. BP drops as low as 80s/50s with positional changes. He has been using compression stockings.  Drinking plenty of water and Gatorade.  Not eating as much due to ongoing jaw pain.  Not restricting salt in his diet. Has not heard anything about endocrine referral. Currently lives with daughter who provides 24/7 supervision.  Also has home health PT and OT.  Next urology appointment in 1 week. Next neurology appointment in 1 week.  Has not had alcohol in quite some time.  PERTINENT  PMH / PSH: HTN, CHF, laryngeal cancer, chronic pancreatitis, adrenal insufficiency, hypothyroidism, epilepsy, CKD stage III  Patient Care Team: Rise Patience, DO as PCP - General (Family Medicine) Dickie La, MD as Consulting Physician (Family Medicine)   OBJECTIVE:   BP (!) 142/90   Pulse 89   Ht 5\' 11"  (1.803 m)   SpO2 99%   BMI 14.64 kg/m   Physical Exam Constitutional:      General: He is not in acute distress.    Comments: Seated in wheelchair  Cardiovascular:     Rate and Rhythm: Normal rate and regular rhythm.  Pulmonary:     Effort: Pulmonary effort is normal. No respiratory distress.     Breath sounds: Normal breath sounds.  Neurological:     Mental Status: He is alert.         06/01/2022    3:29 PM  Depression screen PHQ 2/9  Decreased Interest 0  Down, Depressed, Hopeless 0  PHQ - 2 Score 0  Altered sleeping 0  Tired, decreased energy 2  Change in appetite 2  Feeling bad or failure  about yourself  0  Trouble concentrating 0  Moving slowly or fidgety/restless 2  Suicidal thoughts 0  PHQ-9 Score 6     {Show previous vital signs (optional):23777}    ASSESSMENT/PLAN:   Orthostatic hypotension Ongoing problem with positive orthostatics previously in clinic and in the ED. No significant improvement after discontinuing tamsulosin.  No significant medications that would be contributing.  Probably multifactorial with deconditioning, underlying seizure disorder, and laryngeal cancer.  He will be seeing endocrinology for possible adrenal insufficiency. - endocrinology information provided - continue compression, fluid intake, and liberalizing salt intake    Return in about 3 months (around 09/01/2022) for f/u orthostatic hypotension.   Zola Button, MD [place]

## 2022-06-06 ENCOUNTER — Institutional Professional Consult (permissible substitution): Payer: Medicare Other | Admitting: Neurology

## 2022-06-07 ENCOUNTER — Ambulatory Visit (INDEPENDENT_AMBULATORY_CARE_PROVIDER_SITE_OTHER): Payer: Medicare Other | Admitting: Neurology

## 2022-06-07 DIAGNOSIS — G40009 Localization-related (focal) (partial) idiopathic epilepsy and epileptic syndromes with seizures of localized onset, not intractable, without status epilepticus: Secondary | ICD-10-CM | POA: Diagnosis not present

## 2022-06-07 DIAGNOSIS — G312 Degeneration of nervous system due to alcohol: Secondary | ICD-10-CM

## 2022-06-07 DIAGNOSIS — C321 Malignant neoplasm of supraglottis: Secondary | ICD-10-CM

## 2022-06-07 DIAGNOSIS — R296 Repeated falls: Secondary | ICD-10-CM

## 2022-06-07 DIAGNOSIS — E43 Unspecified severe protein-calorie malnutrition: Secondary | ICD-10-CM

## 2022-06-07 DIAGNOSIS — F1020 Alcohol dependence, uncomplicated: Secondary | ICD-10-CM

## 2022-06-09 DIAGNOSIS — N130 Hydronephrosis with ureteropelvic junction obstruction: Secondary | ICD-10-CM | POA: Diagnosis not present

## 2022-06-09 DIAGNOSIS — N401 Enlarged prostate with lower urinary tract symptoms: Secondary | ICD-10-CM | POA: Diagnosis not present

## 2022-06-09 DIAGNOSIS — R3914 Feeling of incomplete bladder emptying: Secondary | ICD-10-CM | POA: Diagnosis not present

## 2022-06-12 DIAGNOSIS — I951 Orthostatic hypotension: Secondary | ICD-10-CM | POA: Diagnosis not present

## 2022-06-12 DIAGNOSIS — Z1330 Encounter for screening examination for mental health and behavioral disorders, unspecified: Secondary | ICD-10-CM | POA: Diagnosis not present

## 2022-06-12 DIAGNOSIS — E039 Hypothyroidism, unspecified: Secondary | ICD-10-CM | POA: Diagnosis not present

## 2022-06-12 DIAGNOSIS — R6884 Jaw pain: Secondary | ICD-10-CM | POA: Diagnosis not present

## 2022-06-14 DIAGNOSIS — G312 Degeneration of nervous system due to alcohol: Secondary | ICD-10-CM | POA: Insufficient documentation

## 2022-06-14 NOTE — Procedures (Signed)
[facility]  EEG (ELECTROENCEPHALOGRAM) REPORT     ORDERING CLINICIAN: Melvyn Novas, M.D.  TECHNOLOGIST:Kelly Suitor , REEGT TECHNIQUE:  This Electroencephalogram was recorded utilizing the international standard 10-20 system of lead placement and reformatted into average and bipolar montages.  A single ECG electrode is placed to detect heart rate and rhythm. While a camera is directed at the patient during EEG , and a video screen is parallel to the EEG observed by the attending technologist, the video and audio can not be recorded.   STUDY DATE:   06-07-2022 PATIENT NAME: [name]  Recoding duration : 24 minutes  Activation included:  yes Photic stimulation , no Hyperventilation .      Description: The EEG's posterior dominant background rhythm of 8-9 hertz was symmetrically displayed while the patient's eyes were closed , and promptly attenuated with eye opening. At baseline, the recording showed a normal amplitude in symmetric fashion. There were some Vortex waves noted , again symmetric- these may have indicated drowsiness but were not epileptiform.   Photic stimulation was initiated at frequencies from 3- through 21 hertz, resulting in no  photic entrainment nor any periodic or rhythmic discharges, or epileptiform activity.   Hyperventilation maneuver was not  initiated.   The patient's EKG was recorded in regular rhythm with a mean frequency of 68 bpm.   IMPRESSION:  This EEG is normal Melvyn Novas, MD    Dr. Melvyn Novas, M.D. Accredited by the ABPN, ABSM.

## 2022-06-15 ENCOUNTER — Encounter: Payer: Self-pay | Admitting: Neurology

## 2022-06-20 DIAGNOSIS — S42031D Displaced fracture of lateral end of right clavicle, subsequent encounter for fracture with routine healing: Secondary | ICD-10-CM | POA: Diagnosis not present

## 2022-06-23 ENCOUNTER — Other Ambulatory Visit: Payer: Self-pay | Admitting: Family Medicine

## 2022-06-23 DIAGNOSIS — R339 Retention of urine, unspecified: Secondary | ICD-10-CM

## 2022-06-26 DIAGNOSIS — Z930 Tracheostomy status: Secondary | ICD-10-CM

## 2022-06-27 ENCOUNTER — Other Ambulatory Visit: Payer: Self-pay | Admitting: Family Medicine

## 2022-06-27 DIAGNOSIS — N476 Balanoposthitis: Secondary | ICD-10-CM | POA: Diagnosis not present

## 2022-06-28 ENCOUNTER — Other Ambulatory Visit: Payer: Medicare Other

## 2022-06-28 ENCOUNTER — Telehealth: Payer: Self-pay

## 2022-06-28 DIAGNOSIS — K148 Other diseases of tongue: Secondary | ICD-10-CM | POA: Diagnosis not present

## 2022-06-28 DIAGNOSIS — Z515 Encounter for palliative care: Secondary | ICD-10-CM

## 2022-06-28 NOTE — Telephone Encounter (Signed)
(  1:47 pm) PC SW left a message for patient's daughter-[name] requesting a call back regarding palliative care services.

## 2022-07-03 ENCOUNTER — Other Ambulatory Visit: Payer: Medicare Other

## 2022-07-03 VITALS — BP 90/56 | HR 89 | Temp 96.7°F

## 2022-07-03 DIAGNOSIS — Z515 Encounter for palliative care: Secondary | ICD-10-CM

## 2022-07-03 NOTE — Progress Notes (Signed)
COMMUNITY PALLIATIVE CARE SW NOTE  PATIENT NAME: [name] DOB: Mar 09, 1950 MRN: [id number]  PRIMARY CARE PROVIDER: Evelena Leyden, DO  RESPONSIBLE PARTY:  Acct ID - Guarantor Home Phone Work Phone Relationship Acct Type  [id number] [name]* [phone]  Self P/F     [address] [place], [place] [address]   Palliative Care Follow-up Visit/Clinical Social Work   Summary of Visit:  PC SW and nurse-D. Clementeen Graham competed a follow-up palliative care visit with patient at his home. His daughter/PCG-[name] joined the visit virtually, while [name]'s spouse (Charmen)was present with patient and team. The palliative care folder was reviewed and consent form was signed for services.   Patient is a high fall risk due to poor safety awareness and cognitive deficits. He has had six falls in the past 6 weeks were he seriously injured his eye and broke his right clavicle.He is alert and oriented to self and is able to verbalize his needs by mouthing the words to his family. He has a TP device that will be replaced on Thursday. His daughter is hoping that patient a new device implanted in his trach for talking, will allow patient to speak audibly after impending procedure He reports SOB with exertion.He is eating 2-3 meals per day that includes a full breakfast, he may or may not eat lunch, and eats a reduced meal for dinner. He may drink a supplement (Ensure) for lunch and will eat a snack.   Patient experiences urinary retention, but no bowel issues. He is dependent on his PCG and paid caregivers for personal care  needs. He is sleeping more than 8 hours at night and he naps during the day.  He is having increased period of confusion and intermittent episodes of memory loss.   Family have installed cameras in patient's room for safety. He also has a call button that alerts family when he needs something or needs to get up. His family is encouraging him not to get up without calling for assistance.    Goals of Care:  To keep patient safe and free of falls.  SOCIAL HX: Patient was born an raised in [place], [place]. 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 He is widowed and they have 4 children. He has Research officer, trade union.  Social History   Tobacco Use   Smoking status: Former    Packs/day: 0.20    Years: 50.00    Additional pack years: 0.00    Total pack years: 10.00    Types: Cigarettes    Quit date: 07/11/2010    Years since quitting: 11.9   Smokeless tobacco: Former    Types: Chew    Quit date: 1990  Substance Use Topics   Alcohol use: Yes    Alcohol/week: 2.0 standard drinks of alcohol    Types: 2 Cans of beer per week    Comment: Liquor- I glass per week   *Cardiac Booklet: Yes, provided *Dementia Booklet: No  CODE STATUS: Full Code ADVANCED DIRECTIVES: Yes MOST FORM COMPLETE:  No, education provided HOSPICE EDUCATION PROVIDED: No PPS: 40%  Next scheduled appointment: 08/01/22 @ 10:30 am.   [name], LCSW

## 2022-07-03 NOTE — Progress Notes (Signed)
PATIENT NAME: [name] DOB: 07/20/1950 MRN: [id number]  PRIMARY CARE PROVIDER: Evelena Leyden, DO  RESPONSIBLE PARTY:  Acct ID - Guarantor Home Phone Work Phone Relationship Acct Type  [id number] [name]* [phone]  Self P/F     [address] [place], [place] [address]    Palliative Care Initial Encounter Note   Completed home visit with [name], SW. Daughter Carollee Herter also present via video and daughter's spouse present in the home     HISTORY OF PRESENT ILLNESS:    TODAY'S VISIT:  Respiratory: has SOB with exertion; denies SOB at this time  Cardiac: CHF, Cardiac booklet given and will discuss on next visit  Cognitive: alert and oriented; daughter reports intermittent confusion and memory loss   Appetite: eats a full breakfast, may or may not eat lunch but gets a supplement and eats a reduced dinner; eats snacks; drinks adequate amt of fluids  GI/GU: has a BM every other day; experiences urinary retention; finished ABT last weekend   Mobility:able to reposition self in bed; also able to get in and out of bed independently; uses a rolling walker to ambulate but not steady on his feet  ADLs:  dependent on caregivers   Sleeping Pattern: sleeps no less than 8 hrs a night  Pain: denies pain at this time; pt reports he has a growth on the L side of his   Fall: two weeks ago without injury; 2-3 weeks prior fell and hit his eye on the dresser and 2-3 weeks before that he fell and broke his clavicle  Trach: pt will be getting a new device implanted in his trach for talking because the current device is clogged; pt will be able to speak audibly after impending procedure   Goals of Care: To keep him safe and in the home with his daughter     Next Appt Scheduled For: 08/01/22 @ 10:30am   CODE STATUS: Full Code  ADVANCED DIRECTIVES: Y MOST FORM: N PPS: 40%   PHYSICAL EXAM:   VITALS: Today's Vitals   07/03/22 1045  BP: (!) 90/56  Pulse: 89  Temp: (!)  96.7 F (35.9 C)  TempSrc: Temporal  SpO2: 94%  PainSc: 0-No pain    LUNGS: clear to auscultation  CARDIAC: Cor RRR EXTREMITIES: AROM x4; no edema SKIN: Skin color, texture, turgor normal. No rashes or lesions  NEURO: negative except for weakness       Alice Burnside Clementeen Graham, LPN

## 2022-07-07 NOTE — Progress Notes (Signed)
COMMUNITY PALLIATIVE CARE SW NOTE  PATIENT NAME: [name] DOB: December 22, 1950 MRN: [id number]  PRIMARY CARE PROVIDER: Evelena Leyden, DO  RESPONSIBLE PARTY:  Acct ID - Guarantor Home Phone Work Phone Relationship Acct Type  [id number] [name]* [phone]  Self P/F     [address] [place], [place] [address]   Initial Palliative Care Telephonic Encounter  PC SW completed an initial telephonic encounter with patient's daughter-[name]. She was provided an explanation of palliative care services to include role in patient's care, visit frequency and contact information. [name] provided an initial verbal consent to services.   She report that patient lives her and her spouse. He has private caregivers for a few hours during the day. He appears malnourished, but he eats well.He has had significant weight loss.  She report that he has lost a lot of weight. He has had several falls in the past two months. He is receiving, PT, OT and nursing through [facility]. He ambulates with a walker. Her requires assistance with personal care tasks.   [name] serves as his POA.  A follow-up visit is scheduled for 07/03/22 @ 10:30 am.   Social History   Tobacco Use   Smoking status: Former    Packs/day: 0.20    Years: 50.00    Additional pack years: 0.00    Total pack years: 10.00    Types: Cigarettes    Quit date: 07/11/2010    Years since quitting: 11.9   Smokeless tobacco: Former    Types: Chew    Quit date: 1990  Substance Use Topics   Alcohol use: Yes    Alcohol/week: 2.0 standard drinks of alcohol    Types: 2 Cans of beer per week    Comment: Liquor- I glass per week    CODE STATUS: Full Code ADVANCED DIRECTIVES: Yes MOST FORM COMPLETE:  No HOSPICE EDUCATION PROVIDED: No  Duration of encounter and documentation: 60 minutes  Denney Shein, LCSW

## 2022-07-10 ENCOUNTER — Other Ambulatory Visit: Payer: Medicare Other

## 2022-07-10 ENCOUNTER — Other Ambulatory Visit: Payer: Self-pay | Admitting: Family Medicine

## 2022-07-10 DIAGNOSIS — E271 Primary adrenocortical insufficiency: Secondary | ICD-10-CM

## 2022-07-11 ENCOUNTER — Ambulatory Visit: Payer: Medicare Other | Admitting: "Endocrinology

## 2022-07-14 DIAGNOSIS — H463 Toxic optic neuropathy: Secondary | ICD-10-CM | POA: Diagnosis not present

## 2022-07-14 DIAGNOSIS — H25813 Combined forms of age-related cataract, bilateral: Secondary | ICD-10-CM | POA: Diagnosis not present

## 2022-07-14 DIAGNOSIS — H25811 Combined forms of age-related cataract, right eye: Secondary | ICD-10-CM | POA: Diagnosis not present

## 2022-07-19 DIAGNOSIS — S42031D Displaced fracture of lateral end of right clavicle, subsequent encounter for fracture with routine healing: Secondary | ICD-10-CM | POA: Diagnosis not present

## 2022-07-21 ENCOUNTER — Other Ambulatory Visit: Payer: Self-pay | Admitting: Family Medicine

## 2022-07-21 DIAGNOSIS — R339 Retention of urine, unspecified: Secondary | ICD-10-CM

## 2022-07-24 ENCOUNTER — Other Ambulatory Visit: Payer: Self-pay | Admitting: Family Medicine

## 2022-07-24 DIAGNOSIS — R339 Retention of urine, unspecified: Secondary | ICD-10-CM

## 2022-07-27 DIAGNOSIS — G40009 Localization-related (focal) (partial) idiopathic epilepsy and epileptic syndromes with seizures of localized onset, not intractable, without status epilepticus: Secondary | ICD-10-CM | POA: Diagnosis not present

## 2022-07-27 DIAGNOSIS — E039 Hypothyroidism, unspecified: Secondary | ICD-10-CM | POA: Diagnosis not present

## 2022-07-27 DIAGNOSIS — E271 Primary adrenocortical insufficiency: Secondary | ICD-10-CM | POA: Diagnosis not present

## 2022-07-27 DIAGNOSIS — R6884 Jaw pain: Secondary | ICD-10-CM | POA: Diagnosis not present

## 2022-07-27 DIAGNOSIS — E43 Unspecified severe protein-calorie malnutrition: Secondary | ICD-10-CM | POA: Diagnosis not present

## 2022-07-27 DIAGNOSIS — I951 Orthostatic hypotension: Secondary | ICD-10-CM | POA: Diagnosis not present

## 2022-08-01 ENCOUNTER — Other Ambulatory Visit: Payer: Medicare Other

## 2022-08-01 VITALS — BP 86/66 | HR 85 | Temp 97.5°F

## 2022-08-01 DIAGNOSIS — Z515 Encounter for palliative care: Secondary | ICD-10-CM

## 2022-08-01 NOTE — Progress Notes (Signed)
PATIENT NAME: [name] DOB: 1950-11-07 MRN: [id number]  PRIMARY CARE PROVIDER: Evelena Leyden, DO  RESPONSIBLE PARTY:  Acct ID - Guarantor Home Phone Work Phone Relationship Acct Type  [id number] [name]* [phone]  Self P/F     [address] [place], [place] [address]    Palliative Care Follow Up Encounter Note    Completed home visit with [name], SW. daughter's spouse present in the home     HISTORY OF PRESENT ILLNESS:     TODAY'S VISIT:   Respiratory: has SOB with exertion; denies SOB at this time   Cardiac: CHF, Cardiac booklet given and will discuss on next visit   Cognitive: alert and oriented; daughter reports intermittent confusion and memory loss   Appetite: eats a full breakfast, may or may not eat lunch but gets a supplement and eats a reduced dinner; eats snacks; drinks adequate amt of fluids   GI/GU: has a BM every other day; experiences urinary retention; finished ABT last weekend   Mobility:able to reposition self in bed; also able to get in and out of bed independently; uses a rolling walker to ambulate but not steady on his feet   ADLs:  dependent on caregivers   Sleeping Pattern: sleeps no less than 8 hrs a night   Pain: pt reports he has a growth on the L side of his chin/neck   Fall: had a fall approx. 3 weeks ago   Trach: pt may or may not getting a new device implanted in his trach for talking because the current device is clogged; pt would  be able to speak audibly after impending procedure     Goals of Care: To keep him safe and in the home with his daughter      Next Appt Scheduled For: 09/12/22 @ 9am     CODE STATUS: Full Code  ADVANCED DIRECTIVES: Y MOST FORM: N PPS: 40%   PHYSICAL EXAM:   VITALS: Today's Vitals   08/01/22 1051  BP: (!) 86/66  Pulse: 85  Temp: (!) 97.5 F (36.4 C)  TempSrc: Temporal  SpO2: 98%  PainSc: 8   PainLoc: Head     CARDIAC: Cor RRR EXTREMITIES: no edema; AROM x4 SKIN: cool,  dry, intact NEURO: negative except for gait problems and weakness       Nathaniel Degrasse Clementeen Graham, LPN

## 2022-08-01 NOTE — Progress Notes (Signed)
COMMUNITY PALLIATIVE CARE SW NOTE  PATIENT NAME: [name] DOB: 13-Mar-1950 MRN: [id number]  PRIMARY CARE PROVIDER: Evelena Leyden, DO  RESPONSIBLE PARTY:  Acct ID - Guarantor Home Phone Work Phone Relationship Acct Type  [id number] [name]* [phone]  Self P/F     [address], [place], [place] [address]   Palliative Care Visit/Clinical Social Work  Summary of Visit:  PC SW and nurse-D. Clementeen Graham competed a follow-up palliative care visit with patient at his home. His daughter-in-law-Charmin and his older sister, who was visiting from [place] was present.   Patient had a fall 3 weeks ago in his bedroom trying to go to the kitchen. He had no injuries. The PCG changed the room around for safety. He remains at risk for falls due to poor safety awareness. He also has a call button that alerts family when he needs something or needs to get up. His family continues to encourage him not to get up without calling for assistance.   Patient report pain to his left side of face from his forehead down to his neck. He has refusing meals after breakfast, generally just snacking most of the day. Patient appears to be thinner, boney throughout his extremities. He reports that he is sleeping well. He is being assessed for a new TP device and his daughter is hoping that this will allow patient to speak audibly after the impending procedure He may or may not be getting a new device depending on patient's BP, and if he will use the device or not. Patient is scheduled to have a biopsy July 31st.  Patient remains dependent on his daughter/family and paid caregivers for personal care  needs. He continues to sleep more than 8 hours at night and he is napping during the day.  He is having increased periods of confusion and intermittent episodes of memory loss.   Goals of Care:  To keep patient safe and free of falls.    Social History   Tobacco Use   Smoking status: Former    Packs/day: 0.20     Years: 50.00    Additional pack years: 0.00    Total pack years: 10.00    Types: Cigarettes    Quit date: 07/11/2010    Years since quitting: 12.0   Smokeless tobacco: Former    Types: Chew    Quit date: 1990  Substance Use Topics   Alcohol use: Yes    Alcohol/week: 2.0 standard drinks of alcohol    Types: 2 Cans of beer per week    Comment: Liquor- I glass per week    CODE STATUS: Full Code ADVANCED DIRECTIVES: Yes MOST FORM COMPLETE: No HOSPICE EDUCATION PROVIDED: No PPS:40% Next Appointment: September 12, 2022 @ 9am with nurse.  [address] [place], [place]

## 2022-08-08 DIAGNOSIS — R9431 Abnormal electrocardiogram [ECG] [EKG]: Secondary | ICD-10-CM | POA: Diagnosis not present

## 2022-08-08 DIAGNOSIS — I951 Orthostatic hypotension: Secondary | ICD-10-CM | POA: Diagnosis not present

## 2022-08-08 DIAGNOSIS — E43 Unspecified severe protein-calorie malnutrition: Secondary | ICD-10-CM | POA: Diagnosis not present

## 2022-08-08 DIAGNOSIS — I959 Hypotension, unspecified: Secondary | ICD-10-CM | POA: Diagnosis not present

## 2022-08-22 DIAGNOSIS — S42031D Displaced fracture of lateral end of right clavicle, subsequent encounter for fracture with routine healing: Secondary | ICD-10-CM | POA: Diagnosis not present

## 2022-09-11 ENCOUNTER — Other Ambulatory Visit: Payer: Self-pay | Admitting: Otolaryngology

## 2022-09-12 ENCOUNTER — Other Ambulatory Visit: Payer: Medicare Other

## 2022-09-14 ENCOUNTER — Ambulatory Visit
Admission: RE | Admit: 2022-09-14 | Discharge: 2022-09-14 | Disposition: A | Discharge status: Home / Self Care | Payer: Medicare Other | Source: Ambulatory Visit | Attending: Physician Assistant | Admitting: Physician Assistant

## 2022-09-14 ENCOUNTER — Other Ambulatory Visit: Payer: Self-pay | Admitting: Physician Assistant

## 2022-09-14 DIAGNOSIS — N1831 Chronic kidney disease, stage 3a: Secondary | ICD-10-CM | POA: Diagnosis not present

## 2022-09-14 DIAGNOSIS — R062 Wheezing: Secondary | ICD-10-CM | POA: Diagnosis not present

## 2022-09-14 DIAGNOSIS — I951 Orthostatic hypotension: Secondary | ICD-10-CM | POA: Diagnosis not present

## 2022-09-14 DIAGNOSIS — Z930 Tracheostomy status: Secondary | ICD-10-CM | POA: Diagnosis not present

## 2022-09-17 ENCOUNTER — Encounter (HOSPITAL_COMMUNITY): Payer: Self-pay | Admitting: Otolaryngology

## 2022-09-17 ENCOUNTER — Other Ambulatory Visit: Payer: Self-pay

## 2022-09-17 NOTE — Progress Notes (Signed)
PCP - Julien Girt PA [facility] Cardiologist - Alda Berthold MD Endocrinologist - Crista Curb MD Vascular- Gerarda Fraction MD Neurologist- Merlyn Albert MD  EKG - 05/24/2022 Chest x-ray - 09/14/2022 [facility] ECHO - 08/08/2022 [facility] Cardiac Cath - Denies  Sleep Study- Denies   Blood Thinner Instructions: N/A Aspirin Instructions: Ask MD  ERAS Protcol - No COVID TEST- N/A  Anesthesia review: Yes  -------------  SDW INSTRUCTIONS:  Your procedure is scheduled on Tuesday July 16th. Please report to [facility] Main Entrance "A" at 0800 A.M., and check in at the Admitting office. Call this number if you have problems the morning of surgery: [phone]   Remember: Do not eat or drink after midnight the night before your surgery     Medications to take morning of surgery with a sip of water include:  finasteride (PROSCAR) levETIRAcetam (KEPPRA)  levothyroxine (SYNTHROID)   IF NEEDED guaiFENesin (MUCINEX)   As of today, STOP taking any Aspirin (unless otherwise instructed by your surgeon), Aleve, Naproxen, Ibuprofen, Motrin, Advil, Goody's, BC's, all herbal medications, fish oil, and all vitamins. diclofenac Sodium (VOLTAREN)     The Morning of Surgery Do not wear jewelry, make-up or nail polish. Do not wear lotions, powders, or perfumes/colognes, or deodorant Do not bring valuables to the hospital. [facility] is not responsible for any belongings or valuables.  If you are a smoker, DO NOT Smoke 24 hours prior to surgery  If you wear a CPAP at night please bring your mask the morning of surgery   Remember that you must have someone to transport you home after your surgery, and remain with you for 24 hours if you are discharged the same day.  Please bring cases for contacts, glasses, hearing aids, dentures or bridgework because it cannot be worn into surgery.   Patients discharged the day of surgery will not be allowed to drive home.   Please shower  the NIGHT BEFORE/MORNING OF SURGERY (use antibacterial soap like DIAL soap if possible). Wear comfortable clothes the morning of surgery. Oral Hygiene is also important to reduce your risk of infection.  Remember - BRUSH YOUR TEETH THE MORNING OF SURGERY WITH YOUR REGULAR TOOTHPASTE  Patient denies shortness of breath, fever, cough and chest pain.

## 2022-09-18 ENCOUNTER — Encounter (HOSPITAL_COMMUNITY): Payer: Self-pay | Admitting: Otolaryngology
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
## 2022-09-18 NOTE — Anesthesia Preprocedure Evaluation (Addendum)
Anesthesia Evaluation  Patient identified by MRN, date of birth, ID band Patient awake    Reviewed: Allergy & Precautions, NPO status , [name] Chart, lab work & pertinent test results  History of Anesthesia Complications (+) history of anesthetic complications (hyperkalemic vs resp arrest)  Airway Mallampati: Trach  TM Distance: >3 FB Neck ROM: Full    Dental  (+) Edentulous Upper, Edentulous Lower   Pulmonary former smoker Trach s/p laryngectomy   breath sounds clear to auscultation       Cardiovascular hypertension, + Peripheral Vascular Disease   Rhythm:Regular Rate:Normal  Mitodrine BP support  chronic systolic CHF (due to NICM likely related to ETOH/uncontrolled HTN 12/2011, EF 15-20%, normal coronaries 12/28/11->EF 55% 05/2022; 50-55% 08/2022),  08/2022 ECHO: ([facility]): -  Left Ventricle: Left ventricle size is normal.    Left Ventricle: Systolic function is low normal. EF: 50-55%.    Left Ventricle: Doppler parameters consistent with mild diastolic dysfunction and low to normal LA pressure.    Right Ventricle: Right ventricle size is normal.    Right Ventricle: Systolic function is normal.    Tricuspid Valve: The right ventricular systolic pressure is mildly elevated (37-49 mmHg).     Neuro/Psych Seizures - (Keppra), Well Controlled,  H/o meningioma    GI/Hepatic negative GI ROS,,,(+) Cirrhosis       alcohol use and marijuana use  Endo/Other  Hypothyroidism    Renal/GU Renal InsufficiencyRenal disease     Musculoskeletal  (+) Arthritis ,    Abdominal   Peds  Hematology negative hematology ROS (+)   Anesthesia Other Findings S/p laryngectomy, Laryngeal cancer: trach  [facility]:  [facility] cardiologist Dr. Fransisco Beau on 08/08/22 for hypotension in setting of severe protein calorie deficit. Orthostatic vitals were positive. By Narrative, EKG showed SR, LAE, possible LVH, negative T waves. He was  started on midodrine and an echocardiogram was ordered. 08/08/22 echo showed normal LV size, LVEF 50-55%, LV wall motion was normal, mild diastolic dysfunction, normal RV size, normal RV systolic function, RVSP mildly elevated at 37-49 mmHg. (Of note midodrine dose decreased on 09/14/22 after BP at PCP office was 170/90.  Reproductive/Obstetrics                             Anesthesia Physical Anesthesia Plan  ASA: 4  Anesthesia Plan: General   Post-op Pain Management: Minimal or no pain anticipated   Induction: Intravenous  PONV Risk Score and Plan: 2 and Ondansetron and Dexamethasone  Airway Management Planned: Tracheostomy  Additional Equipment: None  Intra-op Plan:   Post-operative Plan: Extubation in OR  Informed Consent: I have reviewed the patients History and Physical, chart, labs and discussed the procedure including the risks, benefits and alternatives for the proposed anesthesia with the patient or authorized representative who has indicated his/her understanding and acceptance.       Plan Discussed with: CRNA and Surgeon  Anesthesia Plan Comments: (PAT note written 09/18/2022 by Shonna Chock, PA-C.  )       Anesthesia Quick Evaluation

## 2022-09-19 ENCOUNTER — Ambulatory Visit (HOSPITAL_BASED_OUTPATIENT_CLINIC_OR_DEPARTMENT_OTHER): Payer: Medicare Other | Admitting: Certified Registered"

## 2022-09-19 ENCOUNTER — Encounter (HOSPITAL_COMMUNITY): Payer: Self-pay | Admitting: Otolaryngology

## 2022-09-19 ENCOUNTER — Other Ambulatory Visit: Payer: Self-pay

## 2022-09-19 ENCOUNTER — Encounter (HOSPITAL_COMMUNITY)
Admission: RE | Disposition: A | Discharge status: Home / Self Care | Payer: Self-pay | Source: Home / Self Care | Attending: Otolaryngology

## 2022-09-19 ENCOUNTER — Ambulatory Visit (HOSPITAL_COMMUNITY)
Admission: RE | Admit: 2022-09-19 | Discharge: 2022-09-19 | Disposition: A | Discharge status: Home / Self Care | Payer: Medicare Other | Attending: Otolaryngology | Admitting: Otolaryngology

## 2022-09-19 ENCOUNTER — Ambulatory Visit (HOSPITAL_COMMUNITY): Payer: Medicare Other | Admitting: Certified Registered"

## 2022-09-19 DIAGNOSIS — I5022 Chronic systolic (congestive) heart failure: Secondary | ICD-10-CM

## 2022-09-19 DIAGNOSIS — C029 Malignant neoplasm of tongue, unspecified: Secondary | ICD-10-CM | POA: Diagnosis not present

## 2022-09-19 DIAGNOSIS — E039 Hypothyroidism, unspecified: Secondary | ICD-10-CM | POA: Insufficient documentation

## 2022-09-19 DIAGNOSIS — Z800 Family history of malignant neoplasm of digestive organs: Secondary | ICD-10-CM | POA: Diagnosis not present

## 2022-09-19 DIAGNOSIS — E785 Hyperlipidemia, unspecified: Secondary | ICD-10-CM | POA: Insufficient documentation

## 2022-09-19 DIAGNOSIS — Z9002 Acquired absence of larynx: Secondary | ICD-10-CM | POA: Insufficient documentation

## 2022-09-19 DIAGNOSIS — N189 Chronic kidney disease, unspecified: Secondary | ICD-10-CM | POA: Insufficient documentation

## 2022-09-19 DIAGNOSIS — I130 Hypertensive heart and chronic kidney disease with heart failure and stage 1 through stage 4 chronic kidney disease, or unspecified chronic kidney disease: Secondary | ICD-10-CM | POA: Insufficient documentation

## 2022-09-19 DIAGNOSIS — Z8521 Personal history of malignant neoplasm of larynx: Secondary | ICD-10-CM | POA: Diagnosis not present

## 2022-09-19 DIAGNOSIS — Z79899 Other long term (current) drug therapy: Secondary | ICD-10-CM | POA: Diagnosis not present

## 2022-09-19 DIAGNOSIS — N183 Chronic kidney disease, stage 3 unspecified: Secondary | ICD-10-CM

## 2022-09-19 DIAGNOSIS — C01 Malignant neoplasm of base of tongue: Secondary | ICD-10-CM | POA: Diagnosis not present

## 2022-09-19 DIAGNOSIS — Z1152 Encounter for screening for COVID-19: Secondary | ICD-10-CM | POA: Insufficient documentation

## 2022-09-19 DIAGNOSIS — Z9221 Personal history of antineoplastic chemotherapy: Secondary | ICD-10-CM | POA: Insufficient documentation

## 2022-09-19 DIAGNOSIS — Z87891 Personal history of nicotine dependence: Secondary | ICD-10-CM

## 2022-09-19 DIAGNOSIS — Z801 Family history of malignant neoplasm of trachea, bronchus and lung: Secondary | ICD-10-CM | POA: Diagnosis not present

## 2022-09-19 DIAGNOSIS — K148 Other diseases of tongue: Secondary | ICD-10-CM

## 2022-09-19 DIAGNOSIS — Z85818 Personal history of malignant neoplasm of other sites of lip, oral cavity, and pharynx: Secondary | ICD-10-CM | POA: Diagnosis not present

## 2022-09-19 DIAGNOSIS — F1290 Cannabis use, unspecified, uncomplicated: Secondary | ICD-10-CM | POA: Insufficient documentation

## 2022-09-19 DIAGNOSIS — Z923 Personal history of irradiation: Secondary | ICD-10-CM | POA: Diagnosis not present

## 2022-09-19 DIAGNOSIS — K7460 Unspecified cirrhosis of liver: Secondary | ICD-10-CM | POA: Diagnosis not present

## 2022-09-19 DIAGNOSIS — I509 Heart failure, unspecified: Secondary | ICD-10-CM | POA: Insufficient documentation

## 2022-09-19 DIAGNOSIS — R6884 Jaw pain: Secondary | ICD-10-CM | POA: Diagnosis present

## 2022-09-19 DIAGNOSIS — I428 Other cardiomyopathies: Secondary | ICD-10-CM | POA: Insufficient documentation

## 2022-09-19 DIAGNOSIS — I129 Hypertensive chronic kidney disease with stage 1 through stage 4 chronic kidney disease, or unspecified chronic kidney disease: Secondary | ICD-10-CM | POA: Diagnosis not present

## 2022-09-19 DIAGNOSIS — Z79890 Hormone replacement therapy (postmenopausal): Secondary | ICD-10-CM | POA: Diagnosis not present

## 2022-09-19 HISTORY — DX: Disorder of arteries and arterioles, unspecified: I77.9

## 2022-09-19 HISTORY — PX: DIRECT LARYNGOSCOPY: SHX5326

## 2022-09-19 LAB — SARS CORONAVIRUS 2 BY RT PCR: SARS Coronavirus 2 by RT PCR: NEGATIVE

## 2022-09-19 LAB — BASIC METABOLIC PANEL
Anion gap: 8 (ref 5–15)
BUN: 15 mg/dL (ref 8–23)
CO2: 24 mmol/L (ref 22–32)
Calcium: 8.8 mg/dL — ABNORMAL LOW (ref 8.9–10.3)
Chloride: 104 mmol/L (ref 98–111)
Creatinine, Ser: 0.95 mg/dL (ref 0.61–1.24)
GFR, Estimated: >60 mL/min (ref >60)
Glucose, Bld: 84 mg/dL (ref 70–99)
Potassium: 3.9 mmol/L (ref 3.5–5.1)
Sodium: 136 mmol/L (ref 135–145)

## 2022-09-19 LAB — CBC
HCT: 38.5 % — ABNORMAL LOW (ref 39.0–52.0)
Hemoglobin: 11.6 g/dL — ABNORMAL LOW (ref 13.0–17.0)
MCH: 24.9 pg — ABNORMAL LOW (ref 26.0–34.0)
MCHC: 30.1 g/dL (ref 30.0–36.0)
MCV: 82.6 fL (ref 80.0–100.0)
Platelets: 239 10*3/uL (ref 150–400)
RBC: 4.66 MIL/uL (ref 4.22–5.81)
RDW: 17 % — ABNORMAL HIGH (ref 11.5–15.5)
WBC: 5.8 10*3/uL (ref 4.0–10.5)
nRBC: 0 % (ref 0.0–0.2)

## 2022-09-19 SURGERY — LARYNGOSCOPY, DIRECT
Anesthesia: General | Site: Throat | Laterality: Bilateral

## 2022-09-19 MED ORDER — PROPOFOL 10 MG/ML IV BOLUS
INTRAVENOUS | Status: DC | PRN
  Administered 2022-09-19 (×2): 20 mg via INTRAVENOUS

## 2022-09-19 MED ORDER — PROPOFOL 10 MG/ML IV BOLUS
INTRAVENOUS | Status: AC
  Filled 2022-09-19: qty 20

## 2022-09-19 MED ORDER — LACTATED RINGERS IV SOLN: INTRAVENOUS | Status: DC

## 2022-09-19 MED ORDER — ONDANSETRON HCL 4 MG/2ML IJ SOLN
INTRAMUSCULAR | Status: DC | PRN
  Administered 2022-09-19: 4 mg via INTRAVENOUS

## 2022-09-19 MED ORDER — CHLORHEXIDINE GLUCONATE 0.12 % MT SOLN
15.0000 mL | Freq: Once | OROMUCOSAL | Status: AC
  Administered 2022-09-19: 15 mL via OROMUCOSAL
  Filled 2022-09-19: qty 15

## 2022-09-19 MED ORDER — LIDOCAINE 2% (20 MG/ML) 5 ML SYRINGE
INTRAMUSCULAR | Status: DC | PRN
  Administered 2022-09-19: 60 mg via INTRAVENOUS
  Administered 2022-09-19: 20 mg via INTRAVENOUS

## 2022-09-19 MED ORDER — PROPOFOL 500 MG/50ML IV EMUL
INTRAVENOUS | Status: DC | PRN
  Administered 2022-09-19: 150 ug/kg/min via INTRAVENOUS

## 2022-09-19 MED ORDER — OXYCODONE HCL 5 MG PO TABS: 5.0000 mg | ORAL_TABLET | Freq: Once | ORAL | Status: DC | PRN

## 2022-09-19 MED ORDER — DEXAMETHASONE SODIUM PHOSPHATE 10 MG/ML IJ SOLN
INTRAMUSCULAR | Status: DC | PRN
  Administered 2022-09-19: 4 mg via INTRAVENOUS

## 2022-09-19 MED ORDER — MIDAZOLAM HCL 2 MG/2ML IJ SOLN
INTRAMUSCULAR | Status: AC
  Filled 2022-09-19: qty 2

## 2022-09-19 MED ORDER — OXYMETAZOLINE HCL 0.05 % NA SOLN
NASAL | Status: AC
  Filled 2022-09-19: qty 30

## 2022-09-19 MED ORDER — OXYCODONE HCL 5 MG/5ML PO SOLN: 5.0000 mg | Freq: Once | ORAL | Status: DC | PRN

## 2022-09-19 MED ORDER — MIDAZOLAM HCL 2 MG/2ML IJ SOLN: 0.5000 mg | Freq: Once | INTRAMUSCULAR | Status: DC | PRN

## 2022-09-19 MED ORDER — FENTANYL CITRATE (PF) 250 MCG/5ML IJ SOLN
INTRAMUSCULAR | Status: AC
  Filled 2022-09-19: qty 5

## 2022-09-19 MED ORDER — PHENYLEPHRINE 80 MCG/ML (10ML) SYRINGE FOR IV PUSH (FOR BLOOD PRESSURE SUPPORT)
PREFILLED_SYRINGE | INTRAVENOUS | Status: DC | PRN
  Administered 2022-09-19: 160 ug via INTRAVENOUS

## 2022-09-19 MED ORDER — FENTANYL CITRATE (PF) 250 MCG/5ML IJ SOLN
INTRAMUSCULAR | Status: DC | PRN
  Administered 2022-09-19: 50 ug via INTRAVENOUS
  Administered 2022-09-19: 25 ug via INTRAVENOUS

## 2022-09-19 MED ORDER — STERILE WATER FOR IRRIGATION IR SOLN
Status: DC | PRN
  Administered 2022-09-19: 1000 mL

## 2022-09-19 MED ORDER — PHENYLEPHRINE HCL-NACL 20-0.9 MG/250ML-% IV SOLN
INTRAVENOUS | Status: DC | PRN
  Administered 2022-09-19: 25 ug/min via INTRAVENOUS

## 2022-09-19 MED ORDER — MIDAZOLAM HCL 5 MG/5ML IJ SOLN
INTRAMUSCULAR | Status: DC | PRN
  Administered 2022-09-19: 2 mg via INTRAVENOUS

## 2022-09-19 MED ORDER — EPINEPHRINE HCL (NASAL) 0.1 % NA SOLN
NASAL | Status: AC
  Filled 2022-09-19: qty 30

## 2022-09-19 MED ORDER — PROMETHAZINE HCL 25 MG/ML IJ SOLN: 6.2500 mg | INTRAMUSCULAR | Status: DC | PRN

## 2022-09-19 MED ORDER — ORAL CARE MOUTH RINSE: 15.0000 mL | Freq: Once | OROMUCOSAL | Status: AC

## 2022-09-19 MED ORDER — CEFAZOLIN SODIUM-DEXTROSE 2-4 GM/100ML-% IV SOLN
2.0000 g | INTRAVENOUS | Status: AC
  Administered 2022-09-19: 2 g via INTRAVENOUS
  Filled 2022-09-19: qty 100

## 2022-09-19 MED ORDER — FENTANYL CITRATE (PF) 100 MCG/2ML IJ SOLN: 25.0000 ug | INTRAMUSCULAR | Status: DC | PRN

## 2022-09-19 MED ORDER — MEPERIDINE HCL 25 MG/ML IJ SOLN: 6.2500 mg | INTRAMUSCULAR | Status: DC | PRN

## 2022-09-19 SURGICAL SUPPLY — 28 items
BAG COUNTER SPONGE SURGICOUNT (BAG) ×1 IMPLANT
BAG SPNG CNTER NS LX DISP (BAG) ×1
BALLN PULM 15 16.5 18X75 (BALLOONS)
BALLOON PULM 15 16.5 18X75 (BALLOONS) IMPLANT
CANISTER SUCT 3000ML PPV (MISCELLANEOUS) ×1 IMPLANT
CNTNR URN SCR LID CUP LEK RST (MISCELLANEOUS) IMPLANT
CONT SPEC 4OZ STRL OR WHT (MISCELLANEOUS)
COVER BACK TABLE 60X90IN (DRAPES) ×1 IMPLANT
COVER MAYO STAND STRL (DRAPES) ×1 IMPLANT
DRAPE HALF SHEET 40X57 (DRAPES) ×1 IMPLANT
GAUZE 4X4 16PLY ~~LOC~~+RFID DBL (SPONGE) ×1 IMPLANT
GLOVE BIO SURGEON STRL SZ7.5 (GLOVE) ×1 IMPLANT
GOWN STRL REUS W/ TWL LRG LVL3 (GOWN DISPOSABLE) IMPLANT
GOWN STRL REUS W/TWL LRG LVL3 (GOWN DISPOSABLE)
GUARD TEETH (MISCELLANEOUS) ×1 IMPLANT
KIT TURNOVER KIT B (KITS) ×1 IMPLANT
NDL HYPO 25GX1X1/2 BEV (NEEDLE) IMPLANT
NDL TRANS ORAL INJECTION (NEEDLE) IMPLANT
NEEDLE HYPO 25GX1X1/2 BEV (NEEDLE) IMPLANT
NEEDLE TRANS ORAL INJECTION (NEEDLE) IMPLANT
NS IRRIG 1000ML POUR BTL (IV SOLUTION) ×1 IMPLANT
PAD ARMBOARD 7.5X6 YLW CONV (MISCELLANEOUS) ×2 IMPLANT
PATTIES SURGICAL .5 X3 (DISPOSABLE) IMPLANT
POSITIONER HEAD DONUT 9IN (MISCELLANEOUS) IMPLANT
SOL ANTI FOG 6CC (MISCELLANEOUS) IMPLANT
SURGILUBE 2OZ TUBE FLIPTOP (MISCELLANEOUS) IMPLANT
TOWEL GREEN STERILE FF (TOWEL DISPOSABLE) ×2 IMPLANT
TUBE CONNECTING 12X1/4 (SUCTIONS) ×1 IMPLANT

## 2022-09-19 NOTE — Anesthesia Postprocedure Evaluation (Signed)
Anesthesia Post Note  Patient: [name]  Procedure(s) Performed: DIRECT LARYNGOSCOPY WITH BIOPSIES (Bilateral: Throat)     Patient location during evaluation: PACU Anesthesia Type: General Level of consciousness: awake and alert and patient cooperative Pain management: pain level controlled Vital Signs Assessment: post-procedure vital signs reviewed and stable Respiratory status: spontaneous breathing, nonlabored ventilation and respiratory function stable Cardiovascular status: blood pressure returned to baseline and stable Postop Assessment: no apparent nausea or vomiting Anesthetic complications: no   No notable events documented.  Last Vitals:  Vitals:   09/19/22 1200 09/19/22 1215  BP: 138/85 (!) 159/93  Pulse:    Resp: 16 17  Temp:    SpO2:      Last Pain:  Vitals:   09/19/22 1145  TempSrc:   PainSc: Asleep                 Shaylah Mcghie,E. Valeda Corzine

## 2022-09-19 NOTE — Op Note (Signed)
PREOPERATIVE DIAGNOSIS:  Tongue base mass, left jaw pain   POSTOPERATIVE DIAGNOSIS:  Tongue base mass, left jaw pain   PROCEDURE:  Direct laryngoscopy with biopsy   SURGEON:  Christia Reading, MD   ANESTHESIA:  General.   COMPLICATIONS:  None.   INDICATIONS:  The patient is a 72 year old male with a history of laryngeal and nasopharyngeal cancer with more recent left jaw pain and mass seen in the tongue base on fiberoptic exam.  He presents to the operating room for surgical biopsy.   FINDINGS:  Large ulcerative mass of left tongue base to left pharyngeal wall.   DESCRIPTION OF PROCEDURE:  The patient was identified in the holding room, informed consent having been obtained including discussion of risks, benefits and alternatives, the patient was brought to the operative suite and put the operative table in the supine position.  Anesthesia was induced and the patient was intubated through his tracheal stoma by the Anesthesia team without difficulty.  The eyes were taped closed and bed was turned 90 degrees from anesthesia.  A damp gauze was placed over the upper gum and a Dedo laryngoscope was placed into the pharynx to view the tongue base and pharynx.  Findings are noted above.  Several biopsies were taken from the left tongue base mass using cup forceps.  The laryngoscope was then removed and he was returned to Anesthesia for wake-up.  He was extubated and taken to the recovery room in stable condition.

## 2022-09-19 NOTE — Brief Op Note (Signed)
09/19/2022  11:02 AM  PATIENT:  [name]  72 y.o. male  PRE-OPERATIVE DIAGNOSIS:  Tongue lesion, pain in throat/lower jaw  POST-OPERATIVE DIAGNOSIS:  Tongue lesion, pain in throat/lower jaw  PROCEDURE:  Procedure(s): DIRECT LARYNGOSCOPY WITH BIOPSIES (Bilateral)  SURGEON:  Surgeons and Role:    Christia Reading, MD - Primary  PHYSICIAN ASSISTANT:   ASSISTANTS: none   ANESTHESIA:   general  EBL:  Minimal   BLOOD ADMINISTERED:none  DRAINS: none   LOCAL MEDICATIONS USED:  NONE  SPECIMEN:  Source of Specimen:  Left tongue base mass  DISPOSITION OF SPECIMEN:  PATHOLOGY  COUNTS:  YES  TOURNIQUET:  * No tourniquets in log *  DICTATION: .Note written in EPIC  PLAN OF CARE: Discharge to home after PACU  PATIENT DISPOSITION:  PACU - hemodynamically stable.   Delay start of Pharmacological VTE agent (>24hrs) due to surgical blood loss or risk of bleeding: no

## 2022-09-19 NOTE — Transfer of Care (Signed)
Immediate Anesthesia Transfer of Care Note  Patient: [name]  Procedure(s) Performed: DIRECT LARYNGOSCOPY WITH BIOPSIES (Bilateral: Throat)  Patient Location: PACU  Anesthesia Type:General  Level of Consciousness: drowsy  Airway & Oxygen Therapy: Patient Spontanous Breathing  Post-op Assessment: Report given to RN and Post -op Vital signs reviewed and stable  Post vital signs: Reviewed and stable  Last Vitals:  Vitals Value Taken Time  BP 111/74 09/19/22 1121  Temp    Pulse 72 09/19/22 1123  Resp 20 09/19/22 1123  SpO2 100 % 09/19/22 1123  Vitals shown include unfiled device data.  Last Pain:  Vitals:   09/19/22 0832  TempSrc:   PainSc: 8          Complications: No notable events documented.
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
## 2022-09-19 NOTE — Anesthesia Procedure Notes (Signed)
Procedure Name: Intubation Date/Time: 09/19/2022 10:48 AM  Performed by: Rachel Moulds, CRNAPre-anesthesia Checklist: Patient identified, Emergency Drugs available, Suction available, Patient being monitored and Timeout performed Patient Re-evaluated:Patient Re-evaluated prior to induction Oxygen Delivery Method: Circle system utilized Preoxygenation: Pre-oxygenation with 100% oxygen Induction Type: IV induction Ventilation: Mask ventilation without difficulty Tube size: 6.0 mm Number of attempts: 1 Placement Confirmation: positive ETCO2, CO2 detector and breath sounds checked- equal and bilateral Tube secured with: Tape Dental Injury: Teeth and Oropharynx as per pre-operative assessment  Comments: ET tube advanced through stoma

## 2022-09-20 ENCOUNTER — Encounter (HOSPITAL_COMMUNITY): Payer: Self-pay | Admitting: Otolaryngology

## 2022-09-20 LAB — SURGICAL PATHOLOGY

## 2022-09-21 DIAGNOSIS — I951 Orthostatic hypotension: Secondary | ICD-10-CM | POA: Diagnosis not present

## 2022-09-21 DIAGNOSIS — Z930 Tracheostomy status: Secondary | ICD-10-CM | POA: Diagnosis not present

## 2022-09-21 DIAGNOSIS — C01 Malignant neoplasm of base of tongue: Secondary | ICD-10-CM | POA: Diagnosis not present

## 2022-09-21 DIAGNOSIS — E039 Hypothyroidism, unspecified: Secondary | ICD-10-CM | POA: Diagnosis not present

## 2022-09-25 ENCOUNTER — Other Ambulatory Visit: Payer: Self-pay

## 2022-09-25 DIAGNOSIS — C01 Malignant neoplasm of base of tongue: Secondary | ICD-10-CM

## 2022-09-25 NOTE — Progress Notes (Signed)
Oncology Nurse Navigator Documentation   Placed introductory call to new referral patient Tradarius Reinwald.I spoke with his POA daughter Rich Number.  Introduced myself as the H&N oncology nurse navigator that works with Dr. Basilio Cairo and his medical oncologist to whom he has been referred by Dr. Jenne Pane. She confirmed understanding of referral. Briefly explained my role as his navigator, provided my contact information.  Confirmed understanding of upcoming appts at [facility] nuclear medicine for PET scan. She is aware that she will receive calls for appointments with radiation and medical oncology. She is aware of  the [facility] location, explained arrival and registration process. Carollee Herter informed me that her father is edentulous.   I encouraged them to call with questions/concerns as he moves forward with appts and procedures.   He verbalized understanding of information provided, expressed appreciation for my call.   Navigator Initial Assessment Employment Status: he is not working- retired Currently on [employer] / STD: no Living Situation: he lives with his daughter Support System: family PCP: Jerre Simon MD PCD: na Financial Concerns: na Transportation Needs: no Sensory Deficits: na Language Barriers/Interpreter Needed:  no Ambulation Needs: no Psychosocial Needs:  no Concerns/Needs Understanding Cancer:  addressed/answered by navigator to best of ability Self-Expressed Needs: no   Tourist information centre manager, BSN, OCN Head & Neck Oncology Nurse Navigator [facility] at [facility] Phone # [phone]  Fax # [phone]

## 2022-09-28 ENCOUNTER — Encounter (HOSPITAL_COMMUNITY)
Admission: RE | Admit: 2022-09-28 | Discharge: 2022-09-28 | Disposition: A | Discharge status: Home / Self Care | Payer: Medicare Other | Source: Ambulatory Visit | Attending: Radiation Oncology | Admitting: Radiation Oncology

## 2022-09-28 DIAGNOSIS — C01 Malignant neoplasm of base of tongue: Secondary | ICD-10-CM | POA: Insufficient documentation

## 2022-09-28 DIAGNOSIS — R918 Other nonspecific abnormal finding of lung field: Secondary | ICD-10-CM | POA: Diagnosis not present

## 2022-09-28 DIAGNOSIS — C770 Secondary and unspecified malignant neoplasm of lymph nodes of head, face and neck: Secondary | ICD-10-CM | POA: Diagnosis not present

## 2022-09-28 LAB — GLUCOSE, CAPILLARY: Glucose-Capillary: 86 mg/dL (ref 70–99)

## 2022-09-28 MED ORDER — FLUDEOXYGLUCOSE F - 18 (FDG) INJECTION
5.0000 | Freq: Once | INTRAVENOUS | Status: AC | PRN
  Administered 2022-09-28: 5.04 via INTRAVENOUS

## 2022-09-28 NOTE — Progress Notes (Signed)
Head and Neck Cancer Location of Tumor / Histology: left base of tongue mass  PET scan on 09-28-22 IMPRESSION: 1. Eccentric left tongue base primary with submental nodal metastasis. 2. Right upper lobe 8 mm pulmonary nodule demonstrates mild hypermetabolism. Given small size, this extent of metabolism is suspicious for neoplasm, favored metachronous primary bronchogenic carcinoma over metastasis. 3. Mild hypermetabolism within the mediastinum and bilateral hila, without adenopathy. Favored to be physiologic. 4. Incidental findings, including: Tiny bilateral pleural effusions. Chronic calcific pancreatitis. Cholelithiasis. Aortic atherosclerosis (ICD10-I70.0), coronary artery atherosclerosis and emphysema (ICD10-J43.9). Right nephrolithiasis.    CT Soft Tissue Neck W Contrast 04/14/2022  IMPRESSION: 1. Chronic laryngectomy with tracheal stoma. Questionable mild increased tracheal wall thickening since 2016 and 2021, consider Tracheitis. But no other inflammatory process is evident in the neck; no fluid collection or abscess.   2. New 7 mm right upper lobe pulmonary nodule since 2016. Non-contrast chest CT at 6-12 months is recommended. If the nodule is stable at time of repeat CT, then future CT at 18-24 months (from today's scan) is considered optional for low-risk patients, but is recommended for high-risk patients. This recommendation follows the consensus statement: Guidelines for Management of Incidental Pulmonary Nodules Detected on CT Images: From the Fleischner Society 2017; Radiology 2017; 284:228-243.   3. Occluded left internal carotid artery since 2016.   4. Slowly enlarging 2 cm intracranial planum sphenoidale Meningioma. No evidence of adjacent cerebral edema.   5.  Aortic Atherosclerosis (ICD10-I70.0).   Patient presented with symptoms of:  Presents today for follow-up of left ear and jaw pain,  He has had left jaw pain for several months   Biopsies revealed:     09-19-22 FINAL MICROSCOPIC DIAGNOSIS:   A. TONGUE MASS, LEFT BASE, EXCISION:  - Invasive moderately differentiated squamous cell carcinoma, see  comment   Nutrition Status Yes No Comments  Weight changes? []  [x]  Pt lost weight before diagnosis, he only weighs 101 lbs, has remained thin as this is his third time having head and neck cancer  Swallowing concerns? []  [x]  Eats slower with pain  PEG? []  [x]  No g tube now, had in past   Referrals Yes No Comments  Social Work? []  [x]    Dentistry? []  [x]  Has not teeth, teeth removed in 2013 after first round of cancer  Swallowing therapy? [x]  []    Nutrition? [x]  []    Med/Onc? [x]  []  Has appointment scheduled   Safety Issues Yes No Comments  Prior radiation? [x]  []  In 2016 he was diagnosed with nasopharyngeal carcinoma which was treated with chemo/radiation.   Pacemaker/ICD? []  [x]    Possible current pregnancy? []  [x]    Is the patient on methotrexate? []  [x]     Tobacco/Marijuana/Snuff/ETOH use: (Per Dr. Dionicio Stall note on 09-19-22) reports that he quit smoking about 12 years ago. His smoking use included cigarettes. He started smoking about 62 years ago. He has a 10 pack-year smoking history. He quit smokeless tobacco use about 34 years ago. His smokeless tobacco use included chew. He reports that he does not currently use alcohol after a past usage of about 2.0 standard drinks of alcohol per week. He reports current drug use. Drug: Marijuana.   Past/Anticipated interventions by otolaryngology, if any:  Crisoforo Oxford Spainhour, PA-C - 06/28/2022  HPI:   [name] is a 72 y.o. male who presents as a return patient.  Presents today for follow-up of left ear and jaw pain. There has not really been any significant improvement since last visit. He was unable  to keep his scheduled follow-up appointment with Dr. Pollyann Kennedy due to being hospitalized. This gentleman has a rather complicated past medical history with having had T3 N0 M0 supraglottic  carcinoma that was treated with total laryngectomy in July 2011 followed by chemo/radiation. In 2016 he was diagnosed with nasopharyngeal carcinoma which was treated with chemo/radiation. He does have a feeding tube in place but his daughter states that he is still getting all of his nutrition orally. Other significant medical issues include congestive heart failure, history of alcoholism, cirrhosis of the liver, pancreatitis, orthostatic hypotension and left sided internal carotid artery occlusion.   Procedures:  Flexible Laryngoscopy Procedure Note  Indications: Left tongue base lesion, left jaw pain  Risks, benefits and clinical relevance of the study were discussed. The patient understands and agrees to proceed.   Procedure: After adequate topical anesthetic was applied, 2 mm flexible laryngoscope was passed through the nasal cavity without difficulty. Flexible laryngoscopy shows patent anterior nasal cavity with minimal crusting, no discharge or infection.  Suggestion of mass of left tongue base region.  Impression & Plans:   1) left tongue base mass  Dr. Jenne Pane was consulted and he examined this gentleman. He has recommended we obtain biopsy under sedation. Patient and daughter are in agreement with this and we will try to get that scheduled in the very near future.Marland Kitchen   09-19-22 Assessment/Plan Left jaw pain and tongue base mass   To OR for pharyngoscopy with biopsy.   Christia Reading, MD 09/19/2022, 10:21 AM  09/19/2022   11:02 AM   PATIENT:  [name]  72 y.o. male   PRE-OPERATIVE DIAGNOSIS:  Tongue lesion, pain in throat/lower jaw   POST-OPERATIVE DIAGNOSIS:  Tongue lesion, pain in throat/lower jaw   PROCEDURE:  Procedure(s): DIRECT LARYNGOSCOPY WITH BIOPSIES (Bilateral)   SURGEON:  Surgeons and Role:    Christia Reading, MD - Primary  Past/Anticipated interventions by medical oncology, if any: Pt to see Dr. Truett Perna on 10-09-22  Current Complaints / other  details:  Pt reports left side jaw/mouth/ face/ ear pain daily. Only using magic mouth wash for pain as Bp is an issue at baseline. Pt on medication to keep his bp up. Pt has had falls at home and has to use wheelchair at times (due to low bp). Pt is also dealing with urinary retention now as well. Pt had radiation in 2016 at [facility]. No major concerns with RN spoke with pt daughter on the phone to gather information.

## 2022-10-03 DIAGNOSIS — C102 Malignant neoplasm of lateral wall of oropharynx: Secondary | ICD-10-CM | POA: Diagnosis not present

## 2022-10-04 ENCOUNTER — Ambulatory Visit: Payer: Medicare Other | Admitting: Radiation Oncology

## 2022-10-04 ENCOUNTER — Ambulatory Visit: Payer: Medicare Other

## 2022-10-05 ENCOUNTER — Encounter: Payer: Self-pay | Admitting: Radiation Oncology

## 2022-10-05 ENCOUNTER — Telehealth: Payer: Self-pay

## 2022-10-05 NOTE — Telephone Encounter (Signed)
Rn called pt family for meaningful use and nurse evaluation information. Note completed and routed to Dr. Basilio Cairo.
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
## 2022-10-06 ENCOUNTER — Ambulatory Visit
Admission: RE | Admit: 2022-10-06 | Discharge: 2022-10-06 | Disposition: A | Discharge status: Home / Self Care | Payer: Medicare Other | Source: Ambulatory Visit | Attending: Radiation Oncology | Admitting: Radiation Oncology

## 2022-10-06 ENCOUNTER — Ambulatory Visit: Admission: RE | Admit: 2022-10-06 | Payer: Medicare Other | Source: Ambulatory Visit

## 2022-10-06 ENCOUNTER — Other Ambulatory Visit: Payer: Self-pay

## 2022-10-06 VITALS — BP 144/90 | HR 78 | Temp 98.0°F | Resp 16 | Wt 100.4 lb

## 2022-10-06 DIAGNOSIS — H7192 Unspecified cholesteatoma, left ear: Secondary | ICD-10-CM | POA: Diagnosis not present

## 2022-10-06 DIAGNOSIS — E785 Hyperlipidemia, unspecified: Secondary | ICD-10-CM | POA: Diagnosis not present

## 2022-10-06 DIAGNOSIS — E039 Hypothyroidism, unspecified: Secondary | ICD-10-CM | POA: Insufficient documentation

## 2022-10-06 DIAGNOSIS — J432 Centrilobular emphysema: Secondary | ICD-10-CM | POA: Insufficient documentation

## 2022-10-06 DIAGNOSIS — K861 Other chronic pancreatitis: Secondary | ICD-10-CM | POA: Insufficient documentation

## 2022-10-06 DIAGNOSIS — J90 Pleural effusion, not elsewhere classified: Secondary | ICD-10-CM | POA: Insufficient documentation

## 2022-10-06 DIAGNOSIS — Z7982 Long term (current) use of aspirin: Secondary | ICD-10-CM | POA: Insufficient documentation

## 2022-10-06 DIAGNOSIS — Z9221 Personal history of antineoplastic chemotherapy: Secondary | ICD-10-CM | POA: Insufficient documentation

## 2022-10-06 DIAGNOSIS — Z79890 Hormone replacement therapy (postmenopausal): Secondary | ICD-10-CM | POA: Diagnosis not present

## 2022-10-06 DIAGNOSIS — G40909 Epilepsy, unspecified, not intractable, without status epilepticus: Secondary | ICD-10-CM | POA: Diagnosis not present

## 2022-10-06 DIAGNOSIS — Z1329 Encounter for screening for other suspected endocrine disorder: Secondary | ICD-10-CM

## 2022-10-06 DIAGNOSIS — C01 Malignant neoplasm of base of tongue: Secondary | ICD-10-CM | POA: Insufficient documentation

## 2022-10-06 DIAGNOSIS — I6523 Occlusion and stenosis of bilateral carotid arteries: Secondary | ICD-10-CM | POA: Diagnosis not present

## 2022-10-06 DIAGNOSIS — R918 Other nonspecific abnormal finding of lung field: Secondary | ICD-10-CM | POA: Diagnosis not present

## 2022-10-06 DIAGNOSIS — I509 Heart failure, unspecified: Secondary | ICD-10-CM | POA: Diagnosis not present

## 2022-10-06 DIAGNOSIS — Z923 Personal history of irradiation: Secondary | ICD-10-CM | POA: Diagnosis not present

## 2022-10-06 DIAGNOSIS — M8580 Other specified disorders of bone density and structure, unspecified site: Secondary | ICD-10-CM | POA: Diagnosis not present

## 2022-10-06 DIAGNOSIS — Z510 Encounter for antineoplastic radiation therapy: Secondary | ICD-10-CM | POA: Insufficient documentation

## 2022-10-06 DIAGNOSIS — R339 Retention of urine, unspecified: Secondary | ICD-10-CM | POA: Diagnosis not present

## 2022-10-06 DIAGNOSIS — N189 Chronic kidney disease, unspecified: Secondary | ICD-10-CM | POA: Diagnosis not present

## 2022-10-06 DIAGNOSIS — Z87891 Personal history of nicotine dependence: Secondary | ICD-10-CM | POA: Insufficient documentation

## 2022-10-06 DIAGNOSIS — I2510 Atherosclerotic heart disease of native coronary artery without angina pectoris: Secondary | ICD-10-CM | POA: Diagnosis not present

## 2022-10-06 DIAGNOSIS — Z801 Family history of malignant neoplasm of trachea, bronchus and lung: Secondary | ICD-10-CM | POA: Insufficient documentation

## 2022-10-06 DIAGNOSIS — Z800 Family history of malignant neoplasm of digestive organs: Secondary | ICD-10-CM | POA: Insufficient documentation

## 2022-10-06 DIAGNOSIS — I130 Hypertensive heart and chronic kidney disease with heart failure and stage 1 through stage 4 chronic kidney disease, or unspecified chronic kidney disease: Secondary | ICD-10-CM | POA: Insufficient documentation

## 2022-10-06 DIAGNOSIS — D320 Benign neoplasm of cerebral meninges: Secondary | ICD-10-CM | POA: Insufficient documentation

## 2022-10-06 DIAGNOSIS — Z8521 Personal history of malignant neoplasm of larynx: Secondary | ICD-10-CM | POA: Insufficient documentation

## 2022-10-06 DIAGNOSIS — Z79899 Other long term (current) drug therapy: Secondary | ICD-10-CM | POA: Insufficient documentation

## 2022-10-06 LAB — TSH: TSH: 1.512 u[IU]/mL (ref 0.350–4.500)

## 2022-10-06 MED ORDER — SODIUM CHLORIDE 0.9% FLUSH
10.0000 mL | Freq: Once | INTRAVENOUS | Status: AC
  Administered 2022-10-06: 10 mL via INTRAVENOUS

## 2022-10-06 MED ORDER — OXYCODONE HCL 5 MG/5ML PO SOLN
2.5000 mg | Freq: Four times a day (QID) | ORAL | 0 refills | Status: DC | PRN
Start: 2022-10-06 — End: 2023-04-16

## 2022-10-06 NOTE — Progress Notes (Signed)
Has armband been applied?  Yes.    Does patient have an allergy to IV contrast dye?: No.   Has patient ever received premedication for IV contrast dye?: No.   Does patient take metformin?: No.  If patient does take metformin when was the last dose: na  Date of lab work: 09-19-22 BUN: 15 CR: 0.95 eGfr: >60  IV site: forearm right, condition patent and no redness  Has IV site been added to flowsheet?  Yes.    BP (!) 144/90 (BP Location: Right Arm, Patient Position: Sitting)   Pulse 78   Temp 98 F (36.7 C) (Oral)   Resp 16   Wt 100 lb 6 oz (45.5 kg)   SpO2 96%   BMI 14.00 kg/m   This concludes the interaction.  Ruel Favors, LPN

## 2022-10-09 ENCOUNTER — Inpatient Hospital Stay: Payer: Medicare Other | Attending: Oncology | Admitting: Oncology

## 2022-10-09 VITALS — BP 142/93 | HR 81 | Temp 98.2°F | Resp 18 | Ht 71.0 in | Wt 103.0 lb

## 2022-10-09 DIAGNOSIS — C321 Malignant neoplasm of supraglottis: Secondary | ICD-10-CM | POA: Diagnosis not present
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
## 2022-10-10 ENCOUNTER — Telehealth: Payer: Self-pay

## 2022-10-10 NOTE — Telephone Encounter (Signed)
Clinical Social Work was referred by medical provider for assessment of psychosocial needs.  CSW attempted to contact patient's daughter by phone to offer support and assess for needs.  Left voicemail with contact information and request for return call.

## 2022-10-11 ENCOUNTER — Encounter (HOSPITAL_COMMUNITY): Payer: Medicare Other

## 2022-10-13 ENCOUNTER — Telehealth: Payer: Self-pay | Admitting: *Deleted

## 2022-10-13 NOTE — Telephone Encounter (Signed)
CALLED [name] DAUGHTER- MS. [employer]IPES TO ASK IF SHE WOULD BE ABLE TO COME FOR NUTRITION APPT, ON 10-17-22 @ 3:15 PM,  SPOKE WITH [name] DAUGHTER- MS. [employer]ES AND SHE AGREED TO COME ON 10-17-22 @ 3:15 PM FOR THIS

## 2022-10-16 DIAGNOSIS — E039 Hypothyroidism, unspecified: Secondary | ICD-10-CM | POA: Diagnosis not present

## 2022-10-16 DIAGNOSIS — Z510 Encounter for antineoplastic radiation therapy: Secondary | ICD-10-CM | POA: Diagnosis not present

## 2022-10-16 DIAGNOSIS — C01 Malignant neoplasm of base of tongue: Secondary | ICD-10-CM | POA: Diagnosis not present

## 2022-10-17 ENCOUNTER — Other Ambulatory Visit: Payer: Self-pay

## 2022-10-17 ENCOUNTER — Ambulatory Visit
Admission: RE | Admit: 2022-10-17 | Discharge: 2022-10-17 | Disposition: A | Discharge status: Home / Self Care | Payer: Medicare Other | Source: Ambulatory Visit | Attending: Radiation Oncology | Admitting: Radiation Oncology

## 2022-10-17 ENCOUNTER — Inpatient Hospital Stay: Payer: Medicare Other | Admitting: Nutrition

## 2022-10-17 ENCOUNTER — Encounter: Payer: Medicare Other | Admitting: Nutrition

## 2022-10-17 DIAGNOSIS — C01 Malignant neoplasm of base of tongue: Secondary | ICD-10-CM | POA: Diagnosis not present

## 2022-10-17 DIAGNOSIS — Z510 Encounter for antineoplastic radiation therapy: Secondary | ICD-10-CM | POA: Diagnosis not present

## 2022-10-17 DIAGNOSIS — E039 Hypothyroidism, unspecified: Secondary | ICD-10-CM | POA: Diagnosis not present

## 2022-10-17 LAB — RAD ONC ARIA SESSION SUMMARY
Course Elapsed Days: 0
Course Elapsed Days: 0
Plan Fractions Treated to Date: 1
Plan Fractions Treated to Date: 2
Plan Prescribed Dose Per Fraction: 3.7 Gy
Plan Prescribed Dose Per Fraction: 3.7 Gy
Plan Total Fractions Prescribed: 4
Plan Total Fractions Prescribed: 4
Plan Total Prescribed Dose: 14.8 Gy
Plan Total Prescribed Dose: 14.8 Gy
Reference Point Dosage Given to Date: 3.7 Gy
Reference Point Dosage Given to Date: 7.4 Gy
Reference Point Session Dosage Given: 3.7 Gy
Reference Point Session Dosage Given: 3.7 Gy
Session Number: 1
Session Number: 2

## 2022-10-17 NOTE — Progress Notes (Signed)
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  He has been seen by Dr. Truett Perna and Dr. Basilio Cairo and is receiving a quad shot of palliative radiation therapy.  Patient finishes on Wednesday, August 14.  Patient is hard of hearing and nonverbal. His daughter is with him today and is his caregiver and able to provide detailed history of dietary preferences and tolerances.  Past medical history includes supraglottic cancer in 2011 status post total laryngectomy status post chemo/radiation, nasopharyngeal cancer in 2016 status post chemo/radiation, tracheostomy, alcohol use, CHF, cirrhosis, chronic kidney disease, hyperlipidemia, hypertension, SAH, seizures, tobacco usage.  Medications include vitamin B12, Folvite, Mucinex, Synthroid, Magic mouthwash.  Labs were reviewed.  Height: 5 feet 11 inches. Weight: 103 pounds August 5.   Usual body weight: Patient weighed 106 pounds 4 ounces on April 28, 2022. BMI: 14.36. ECOG: 3.  Estimated nutrition needs: 1500-1700 cal, 75-85 g protein, greater than 1.5 L fluid.  Patient does not currently have a feeding tube in place.  He reports left-sided face and jaw pain and altered taste.  Patient has muscle wasting throughout his body and was diagnosed with severe malnutrition.  He is underweight but has weighed around 100 pounds for several years.  Patient generally consumes breakfast and dinner daily.  He drinks between 2 and 4 cartons of boost plus.  He likes foods that are either sweet or salty.  He generally eats grits, eggs, toast, and spam at breakfast.  He can eat meats such as pork chops if they are prepared soft.  Arthor Captain does help ease difficulty swallowing.  He loves dried beans and peas.  He loves desserts.  His daughter does an amazing job providing foods patient likes and tolerates.  Nutrition diagnosis: Underweight related to cancer and associated treatments as evidenced by BMI of  14.36.  Intervention: Ensure Plus or equivalent 4 times daily. Provided samples of boost VHC as well as Ensure complete and orange resource breeze for variety. Soft diet as tolerated. Provided nutrition facts sheets on ways to add calories and protein as well as foods to offer patients with difficulty swallowing. Provided contact information and answered questions.  Monitoring, evaluation, goals: Patient will tolerate increased calories and protein to minimize further weight loss.  Next visit: Provided contact information and encouraged daughter to contact RD with questions or concerns.

## 2022-10-17 NOTE — Progress Notes (Signed)
Oncology Nurse Navigator Documentation   To provide support, encouragement and care continuity, met with [name] and his daughter for his initial RT.  I had spoken with them over the phone but introduced myself in person today.  I reviewed the 2-step treatment process, answered questions.  Mr. Riley completed treatment without difficulty, denied questions/concerns. I reviewed the registration/arrival procedure for subsequent treatments. I encouraged them to call me with questions/concerns as treatments proceed.   Hedda Slade RN, BSN, OCN Head & Neck Oncology Nurse Navigator [facility] Phone # [phone]  Fax # [phone]

## 2022-10-18 ENCOUNTER — Ambulatory Visit
Admission: RE | Admit: 2022-10-18 | Discharge: 2022-10-18 | Disposition: A | Discharge status: Home / Self Care | Payer: Medicare Other | Source: Ambulatory Visit | Attending: Radiation Oncology | Admitting: Radiation Oncology

## 2022-10-18 ENCOUNTER — Other Ambulatory Visit: Payer: Self-pay

## 2022-10-18 ENCOUNTER — Ambulatory Visit: Payer: Medicare Other

## 2022-10-18 ENCOUNTER — Ambulatory Visit: Admission: RE | Admit: 2022-10-18 | Payer: Medicare Other | Source: Ambulatory Visit

## 2022-10-18 DIAGNOSIS — Z510 Encounter for antineoplastic radiation therapy: Secondary | ICD-10-CM | POA: Diagnosis not present

## 2022-10-18 DIAGNOSIS — E039 Hypothyroidism, unspecified: Secondary | ICD-10-CM | POA: Diagnosis not present

## 2022-10-18 DIAGNOSIS — C01 Malignant neoplasm of base of tongue: Secondary | ICD-10-CM | POA: Diagnosis not present

## 2022-10-18 LAB — RAD ONC ARIA SESSION SUMMARY
Course Elapsed Days: 1
Course Elapsed Days: 1
Plan Fractions Treated to Date: 3
Plan Fractions Treated to Date: 4
Plan Prescribed Dose Per Fraction: 3.7 Gy
Plan Prescribed Dose Per Fraction: 3.7 Gy
Plan Total Fractions Prescribed: 4
Plan Total Fractions Prescribed: 4
Plan Total Prescribed Dose: 14.8 Gy
Plan Total Prescribed Dose: 14.8 Gy
Reference Point Dosage Given to Date: 11.1 Gy
Reference Point Dosage Given to Date: 14.8 Gy
Reference Point Session Dosage Given: 3.7 Gy
Reference Point Session Dosage Given: 3.7 Gy
Session Number: 3
Session Number: 4

## 2022-10-18 NOTE — Progress Notes (Signed)
Ct neck and Ct chest with contrast ordered per request of Dr. Basilio Cairo. Pt will get scan on 9-12 and follow up with Dr. Basilio Cairo on 11-21-22.

## 2022-10-19 ENCOUNTER — Ambulatory Visit: Payer: Medicare Other

## 2022-10-19 NOTE — Radiation Completion Notes (Signed)
Patient Name: [name], [name] MRN: [id number] Date of Birth: 10/16/1950 Referring Physician: Christia Reading, M.D. Date of Service: 2022-10-19 Radiation Oncologist: Lonie Peak, M.D. [facility]                             RADIATION ONCOLOGY END OF TREATMENT NOTE     Diagnosis: C01 Malignant neoplasm of base of tongue Staging on 2022-10-06: Malignant neoplasm of base of tongue (HCC) T=cT3, N=cN1, M=cM0 Intent: Palliative     ==========DELIVERED PLANS==========  First Treatment Date: 2022-10-17 - Last Treatment Date: 2022-10-18   Plan Name: HN_BOT Site: Oropharynx Technique: IMRT Mode: Photon Dose Per Fraction: 3.7 Gy Prescribed Dose (Delivered / Prescribed): 14.8 Gy / 14.8 Gy Prescribed Fxs (Delivered / Prescribed): 4 / 4     ==========ON TREATMENT VISIT DATES========== 2022-10-18     ==========UPCOMING VISITS==========       ==========APPENDIX - ON TREATMENT VISIT NOTES==========   See weekly On Treatment Notes in Epic for details.

## 2022-10-20 ENCOUNTER — Telehealth: Payer: Self-pay

## 2022-10-20 ENCOUNTER — Ambulatory Visit: Payer: Medicare Other

## 2022-10-20 NOTE — Telephone Encounter (Signed)
CSW attempted to contact patient's daughter again to offer support and resources.  Left a vm requesting a return call with contact information.

## 2022-10-31 ENCOUNTER — Encounter: Payer: Medicare Other | Admitting: Nutrition

## 2022-11-08 DIAGNOSIS — E039 Hypothyroidism, unspecified: Secondary | ICD-10-CM | POA: Diagnosis not present

## 2022-11-08 DIAGNOSIS — E2749 Other adrenocortical insufficiency: Secondary | ICD-10-CM | POA: Diagnosis not present

## 2022-11-14 ENCOUNTER — Encounter: Payer: Self-pay | Admitting: Adult Health

## 2022-11-14 ENCOUNTER — Encounter (HOSPITAL_COMMUNITY): Payer: Self-pay

## 2022-11-14 ENCOUNTER — Ambulatory Visit (INDEPENDENT_AMBULATORY_CARE_PROVIDER_SITE_OTHER): Payer: Medicare Other | Admitting: Adult Health

## 2022-11-14 ENCOUNTER — Ambulatory Visit (HOSPITAL_COMMUNITY)
Admission: RE | Admit: 2022-11-14 | Discharge: 2022-11-14 | Disposition: A | Discharge status: Home / Self Care | Payer: Medicare Other | Source: Ambulatory Visit | Attending: Radiation Oncology | Admitting: Radiation Oncology

## 2022-11-14 VITALS — BP 131/91 | HR 77 | Ht 71.0 in | Wt 103.8 lb

## 2022-11-14 DIAGNOSIS — R918 Other nonspecific abnormal finding of lung field: Secondary | ICD-10-CM | POA: Diagnosis not present

## 2022-11-14 DIAGNOSIS — C01 Malignant neoplasm of base of tongue: Secondary | ICD-10-CM | POA: Insufficient documentation

## 2022-11-14 DIAGNOSIS — N183 Chronic kidney disease, stage 3 unspecified: Secondary | ICD-10-CM | POA: Diagnosis not present

## 2022-11-14 DIAGNOSIS — C3490 Malignant neoplasm of unspecified part of unspecified bronchus or lung: Secondary | ICD-10-CM | POA: Diagnosis not present

## 2022-11-14 DIAGNOSIS — G40009 Localization-related (focal) (partial) idiopathic epilepsy and epileptic syndromes with seizures of localized onset, not intractable, without status epilepticus: Secondary | ICD-10-CM | POA: Diagnosis not present

## 2022-11-14 DIAGNOSIS — D320 Benign neoplasm of cerebral meninges: Secondary | ICD-10-CM | POA: Diagnosis not present

## 2022-11-14 DIAGNOSIS — I700 Atherosclerosis of aorta: Secondary | ICD-10-CM | POA: Diagnosis not present

## 2022-11-14 DIAGNOSIS — J432 Centrilobular emphysema: Secondary | ICD-10-CM | POA: Diagnosis not present

## 2022-11-14 DIAGNOSIS — C029 Malignant neoplasm of tongue, unspecified: Secondary | ICD-10-CM | POA: Diagnosis not present

## 2022-11-14 DIAGNOSIS — C760 Malignant neoplasm of head, face and neck: Secondary | ICD-10-CM | POA: Diagnosis not present

## 2022-11-14 LAB — POCT I-STAT CREATININE: Creatinine, Ser: 1.1 mg/dL (ref 0.61–1.24)

## 2022-11-14 MED ORDER — LEVETIRACETAM 500 MG PO TABS: 500.0000 mg | ORAL_TABLET | Freq: Two times a day (BID) | ORAL | 3 refills | Status: DC

## 2022-11-14 MED ORDER — IOHEXOL 300 MG/ML  SOLN
75.0000 mL | Freq: Once | INTRAMUSCULAR | Status: AC | PRN
  Administered 2022-11-14: 75 mL via INTRAVENOUS

## 2022-11-14 MED ORDER — SODIUM CHLORIDE (PF) 0.9 % IJ SOLN
INTRAMUSCULAR | Status: AC
  Filled 2022-11-14: qty 50
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
## 2022-11-14 NOTE — Patient Instructions (Signed)
Your Plan:  Continue keppra 500mg  twice daily   Please call with any seizure activity - may need to consider making medication adjustments with any additional seizure activity without clear cause or contributing factor    Follow up in 6 months or call earlier if needed      Thank you for coming to see [place] at [facility]. I hope we have been able to provide you high quality care today.  You may receive a patient satisfaction survey over the next few weeks. We would appreciate your feedback and comments so that we may continue to improve ourselves and the health of our patients.

## 2022-11-15 NOTE — Progress Notes (Signed)
[name] presents today for follow up for completion of radiation for base of tongue cancer. He completed treatment on 10-18-22.   Pain issues, if any: 8/10 left face/neck area Using a feeding tube?: used to have feeding tube, now eats everything by mouth, pt does use boost and ensure Weight changes, if any: maintained the same weight the last several months Wt Readings from Last 3 Encounters:  11/21/22 103 lb 6 oz (46.9 kg)  11/17/22 103 lb (46.7 kg)  11/14/22 103 lb 12.8 oz (47.1 kg)    Swallowing issues, if any: still has difficulty at times per pt report, pt reports not modifying what he eats and drinks, does modify how he takes big pills  Smoking or chewing tobacco? Former smoker Using fluoride toothpaste daily? Pt has no teeth Last ENT visit was on: Dr. Jenne Pane on 10-03-22, also saw another ENT recently Other notable issues, if any: Wants to know if radiation was successful based on imaging. Overall they are happy with overall progress. Pt has a stoma.   Vitals:   11/21/22 1458  BP: (!) 151/105  Pulse: 74  Resp: 18  Temp: (!) 97.1 F (36.2 C)  SpO2: 100%
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
## 2022-11-17 ENCOUNTER — Ambulatory Visit (INDEPENDENT_AMBULATORY_CARE_PROVIDER_SITE_OTHER): Payer: Medicare Other | Admitting: Vascular Surgery

## 2022-11-17 ENCOUNTER — Encounter: Payer: Self-pay | Admitting: Vascular Surgery

## 2022-11-17 ENCOUNTER — Ambulatory Visit (HOSPITAL_COMMUNITY)
Admission: RE | Admit: 2022-11-17 | Discharge: 2022-11-17 | Disposition: A | Discharge status: Home / Self Care | Payer: Medicare Other | Source: Ambulatory Visit | Attending: Vascular Surgery | Admitting: Vascular Surgery

## 2022-11-17 VITALS — BP 131/88 | HR 73 | Temp 97.7°F | Resp 20 | Ht 71.0 in | Wt 103.0 lb

## 2022-11-17 DIAGNOSIS — I6522 Occlusion and stenosis of left carotid artery: Secondary | ICD-10-CM | POA: Insufficient documentation

## 2022-11-17 DIAGNOSIS — I6521 Occlusion and stenosis of right carotid artery: Secondary | ICD-10-CM | POA: Diagnosis not present

## 2022-11-21 ENCOUNTER — Ambulatory Visit
Admission: RE | Admit: 2022-11-21 | Discharge: 2022-11-21 | Disposition: A | Discharge status: Home / Self Care | Payer: Medicare Other | Source: Ambulatory Visit | Attending: Radiation Oncology | Admitting: Radiation Oncology

## 2022-11-21 ENCOUNTER — Encounter: Payer: Self-pay | Admitting: Radiation Oncology

## 2022-11-21 VITALS — BP 175/105 | HR 74 | Temp 97.1°F | Resp 18 | Ht 71.0 in | Wt 103.4 lb

## 2022-11-21 DIAGNOSIS — C01 Malignant neoplasm of base of tongue: Secondary | ICD-10-CM | POA: Diagnosis not present

## 2022-11-21 DIAGNOSIS — J432 Centrilobular emphysema: Secondary | ICD-10-CM | POA: Diagnosis not present

## 2022-11-21 DIAGNOSIS — D320 Benign neoplasm of cerebral meninges: Secondary | ICD-10-CM | POA: Insufficient documentation

## 2022-11-21 DIAGNOSIS — Z923 Personal history of irradiation: Secondary | ICD-10-CM | POA: Insufficient documentation

## 2022-11-21 DIAGNOSIS — E039 Hypothyroidism, unspecified: Secondary | ICD-10-CM | POA: Diagnosis not present

## 2022-11-21 DIAGNOSIS — E2749 Other adrenocortical insufficiency: Secondary | ICD-10-CM | POA: Diagnosis not present

## 2022-11-21 DIAGNOSIS — Z7982 Long term (current) use of aspirin: Secondary | ICD-10-CM | POA: Diagnosis not present

## 2022-11-21 DIAGNOSIS — Z79899 Other long term (current) drug therapy: Secondary | ICD-10-CM | POA: Insufficient documentation

## 2022-11-21 DIAGNOSIS — Z79890 Hormone replacement therapy (postmenopausal): Secondary | ICD-10-CM | POA: Diagnosis not present

## 2022-11-21 NOTE — Progress Notes (Shared)
right upper lobe, grossly unchanged in size compared to recent prior PET-CT dated 09/28/2022. Moderate centrilobular emphysema. Other: None. IMPRESSION: 1. Abnormal contrast enhancement along the left aspect of the tongue and oropharynx. There is irregularity along the surface of the tongue, new from 04/14/22. Contrast enhancement along the left lateral oropharynx is also new from prior exam. Findings are concerning for recurrent disease. Recommend correlation with direct visualization. 2. Abnormal rounded and enhancing level 1A lymph nodes measuring up to 11 mm are compatible with nodal metastases, unchanged from 09/28/22 PET CT. No new lymphadenopathy. 3. Unchanged spiculated pulmonary nodules in the right upper lobe. See same day chest CT. 4. Unchanged 21 mm meningioma along the planum sphenoidale. Emphysema (ICD10-J43.9). Electronically Signed   By: Lorenza Cambridge M.D.   On: 11/20/2022 10:15   CT Chest W Contrast  Result Date: 11/20/2022 CLINICAL DATA:  Squamous cell carcinoma of the tongue; * Tracking Code: BO * EXAM: CT CHEST WITH CONTRAST TECHNIQUE: Multidetector CT imaging of the chest was performed during intravenous contrast administration. RADIATION DOSE REDUCTION: This exam was performed according to the departmental dose-optimization program which includes automated exposure control, adjustment of the mA and/or kV according to patient size and/or use of iterative reconstruction technique. CONTRAST:  75mL OMNIPAQUE IOHEXOL 300 MG/ML  SOLN COMPARISON:  PET-CT dated September 28, 2022 FINDINGS: Cardiovascular: Normal heart size. No pericardial effusion. Normal caliber thoracic aorta with severe atherosclerotic disease. Mild coronary artery calcifications. Mediastinum/Nodes: Esophagus is unremarkable. No enlarged lymph nodes seen in the chest. Lungs/Pleura: Central airways are patent. Mild centrilobular emphysema. Irregular solid nodule of the right upper lobe is slightly increased in size, measuring 13 x 7 mm on series 4, image 37, previously 12 x 5 mm. Additional small bilateral solid pulmonary nodules are stable. Reference right upper lobe nodule measuring 4 mm on series 4, image 46 and reference lingular nodule measuring 4 mm on image 118. No pleural effusion. Upper Abdomen: Gallstones.  No acute abnormality. Musculoskeletal: No chest wall abnormality. No acute or significant osseous findings. IMPRESSION: 1. Irregular solid nodule of the right upper lobe, possibly slightly increased in size when compared with prior PET-CT, however differences in slice thickness limit accuracy of size comparison, favored to be metachronous primary bronchogenic carcinoma over metastasis. 2. No enlarged lymph nodes seen in the chest. 3. Additional small bilateral solid pulmonary nodules are stable. 4. Aortic Atherosclerosis (ICD10-I70.0) and Emphysema (ICD10-J43.9). Electronically  Signed   By: Allegra Lai M.D.   On: 11/20/2022 09:24   VAS US CAROTID  Result Date: 11/17/2022 Carotid Arterial Duplex Study Patient Name:  [name]  Date of Exam:   11/17/2022 Medical Rec #: [id number]                Accession #:    [id number] Date of Birth: 25-Feb-1951                 Patient Gender: M Patient Age:   4 years Exam Location:  [facility] Procedure:      VAS US CAROTID Referring Phys: Ivin Booty ROBINS --------------------------------------------------------------------------------  Indications:       Carotid artery disease. Risk Factors:      Hypertension, hyperlipidemia. Other Factors:     Known Left ICA occlusion. Comparison Study:  05/12/22 prior Performing Technologist: [place] Ponder RVS  Examination Guidelines: A complete evaluation includes B-mode imaging, spectral Doppler, color Doppler, and power Doppler as needed of all accessible portions of each vessel. Bilateral testing is considered an integral  right upper lobe, grossly unchanged in size compared to recent prior PET-CT dated 09/28/2022. Moderate centrilobular emphysema. Other: None. IMPRESSION: 1. Abnormal contrast enhancement along the left aspect of the tongue and oropharynx. There is irregularity along the surface of the tongue, new from 04/14/22. Contrast enhancement along the left lateral oropharynx is also new from prior exam. Findings are concerning for recurrent disease. Recommend correlation with direct visualization. 2. Abnormal rounded and enhancing level 1A lymph nodes measuring up to 11 mm are compatible with nodal metastases, unchanged from 09/28/22 PET CT. No new lymphadenopathy. 3. Unchanged spiculated pulmonary nodules in the right upper lobe. See same day chest CT. 4. Unchanged 21 mm meningioma along the planum sphenoidale. Emphysema (ICD10-J43.9). Electronically Signed   By: Lorenza Cambridge M.D.   On: 11/20/2022 10:15   CT Chest W Contrast  Result Date: 11/20/2022 CLINICAL DATA:  Squamous cell carcinoma of the tongue; * Tracking Code: BO * EXAM: CT CHEST WITH CONTRAST TECHNIQUE: Multidetector CT imaging of the chest was performed during intravenous contrast administration. RADIATION DOSE REDUCTION: This exam was performed according to the departmental dose-optimization program which includes automated exposure control, adjustment of the mA and/or kV according to patient size and/or use of iterative reconstruction technique. CONTRAST:  75mL OMNIPAQUE IOHEXOL 300 MG/ML  SOLN COMPARISON:  PET-CT dated September 28, 2022 FINDINGS: Cardiovascular: Normal heart size. No pericardial effusion. Normal caliber thoracic aorta with severe atherosclerotic disease. Mild coronary artery calcifications. Mediastinum/Nodes: Esophagus is unremarkable. No enlarged lymph nodes seen in the chest. Lungs/Pleura: Central airways are patent. Mild centrilobular emphysema. Irregular solid nodule of the right upper lobe is slightly increased in size, measuring 13 x 7 mm on series 4, image 37, previously 12 x 5 mm. Additional small bilateral solid pulmonary nodules are stable. Reference right upper lobe nodule measuring 4 mm on series 4, image 46 and reference lingular nodule measuring 4 mm on image 118. No pleural effusion. Upper Abdomen: Gallstones.  No acute abnormality. Musculoskeletal: No chest wall abnormality. No acute or significant osseous findings. IMPRESSION: 1. Irregular solid nodule of the right upper lobe, possibly slightly increased in size when compared with prior PET-CT, however differences in slice thickness limit accuracy of size comparison, favored to be metachronous primary bronchogenic carcinoma over metastasis. 2. No enlarged lymph nodes seen in the chest. 3. Additional small bilateral solid pulmonary nodules are stable. 4. Aortic Atherosclerosis (ICD10-I70.0) and Emphysema (ICD10-J43.9). Electronically  Signed   By: Allegra Lai M.D.   On: 11/20/2022 09:24   VAS US CAROTID  Result Date: 11/17/2022 Carotid Arterial Duplex Study Patient Name:  [name]  Date of Exam:   11/17/2022 Medical Rec #: [id number]                Accession #:    [id number] Date of Birth: 25-Feb-1951                 Patient Gender: M Patient Age:   4 years Exam Location:  [facility] Procedure:      VAS US CAROTID Referring Phys: Ivin Booty ROBINS --------------------------------------------------------------------------------  Indications:       Carotid artery disease. Risk Factors:      Hypertension, hyperlipidemia. Other Factors:     Known Left ICA occlusion. Comparison Study:  05/12/22 prior Performing Technologist: [place] Ponder RVS  Examination Guidelines: A complete evaluation includes B-mode imaging, spectral Doppler, color Doppler, and power Doppler as needed of all accessible portions of each vessel. Bilateral testing is considered an integral  right upper lobe, grossly unchanged in size compared to recent prior PET-CT dated 09/28/2022. Moderate centrilobular emphysema. Other: None. IMPRESSION: 1. Abnormal contrast enhancement along the left aspect of the tongue and oropharynx. There is irregularity along the surface of the tongue, new from 04/14/22. Contrast enhancement along the left lateral oropharynx is also new from prior exam. Findings are concerning for recurrent disease. Recommend correlation with direct visualization. 2. Abnormal rounded and enhancing level 1A lymph nodes measuring up to 11 mm are compatible with nodal metastases, unchanged from 09/28/22 PET CT. No new lymphadenopathy. 3. Unchanged spiculated pulmonary nodules in the right upper lobe. See same day chest CT. 4. Unchanged 21 mm meningioma along the planum sphenoidale. Emphysema (ICD10-J43.9). Electronically Signed   By: Lorenza Cambridge M.D.   On: 11/20/2022 10:15   CT Chest W Contrast  Result Date: 11/20/2022 CLINICAL DATA:  Squamous cell carcinoma of the tongue; * Tracking Code: BO * EXAM: CT CHEST WITH CONTRAST TECHNIQUE: Multidetector CT imaging of the chest was performed during intravenous contrast administration. RADIATION DOSE REDUCTION: This exam was performed according to the departmental dose-optimization program which includes automated exposure control, adjustment of the mA and/or kV according to patient size and/or use of iterative reconstruction technique. CONTRAST:  75mL OMNIPAQUE IOHEXOL 300 MG/ML  SOLN COMPARISON:  PET-CT dated September 28, 2022 FINDINGS: Cardiovascular: Normal heart size. No pericardial effusion. Normal caliber thoracic aorta with severe atherosclerotic disease. Mild coronary artery calcifications. Mediastinum/Nodes: Esophagus is unremarkable. No enlarged lymph nodes seen in the chest. Lungs/Pleura: Central airways are patent. Mild centrilobular emphysema. Irregular solid nodule of the right upper lobe is slightly increased in size, measuring 13 x 7 mm on series 4, image 37, previously 12 x 5 mm. Additional small bilateral solid pulmonary nodules are stable. Reference right upper lobe nodule measuring 4 mm on series 4, image 46 and reference lingular nodule measuring 4 mm on image 118. No pleural effusion. Upper Abdomen: Gallstones.  No acute abnormality. Musculoskeletal: No chest wall abnormality. No acute or significant osseous findings. IMPRESSION: 1. Irregular solid nodule of the right upper lobe, possibly slightly increased in size when compared with prior PET-CT, however differences in slice thickness limit accuracy of size comparison, favored to be metachronous primary bronchogenic carcinoma over metastasis. 2. No enlarged lymph nodes seen in the chest. 3. Additional small bilateral solid pulmonary nodules are stable. 4. Aortic Atherosclerosis (ICD10-I70.0) and Emphysema (ICD10-J43.9). Electronically  Signed   By: Allegra Lai M.D.   On: 11/20/2022 09:24   VAS US CAROTID  Result Date: 11/17/2022 Carotid Arterial Duplex Study Patient Name:  [name]  Date of Exam:   11/17/2022 Medical Rec #: [id number]                Accession #:    [id number] Date of Birth: 25-Feb-1951                 Patient Gender: M Patient Age:   4 years Exam Location:  [facility] Procedure:      VAS US CAROTID Referring Phys: Ivin Booty ROBINS --------------------------------------------------------------------------------  Indications:       Carotid artery disease. Risk Factors:      Hypertension, hyperlipidemia. Other Factors:     Known Left ICA occlusion. Comparison Study:  05/12/22 prior Performing Technologist: [place] Ponder RVS  Examination Guidelines: A complete evaluation includes B-mode imaging, spectral Doppler, color Doppler, and power Doppler as needed of all accessible portions of each vessel. Bilateral testing is considered an integral  Radiation Oncology         [phone] ________________________________  Name: [name] MRN: [id number]  Date: 11/21/2022  DOB: [birth year]  Follow-Up Visit Note  CC: Jerre Simon, MD  Christia Reading, MD  Diagnosis and Prior Radiotherapy:       ICD-10-CM   1. 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 He underwent CT of the neck and chest on 11/14/2022.   CT unfortunately showed abnormal contrast enhancement along the left aspect of the tongue and oropharynx, concerning for recurrent disease; abnormal level 1A lymph nodes, unchanged from 09/28/22 with no new lymphadenopathy; and an irregular solid nodule of the RUL, favored to be metachronous primary bronchogenic carcinoma over metastasis.          Pain issues, if any: 8/10 left face/neck area Using a feeding tube?: used to have feeding tube, now eats everything by mouth, pt does use boost and ensure Weight changes, if any: maintained the same weight the last several months    Wt Readings from Last 3 Encounters:  11/21/22 103 lb 6 oz (46.9 kg)  11/17/22 103 lb (46.7 kg)  11/14/22 103 lb 12.8 oz (47.1 kg)      Swallowing issues, if any: still has difficulty at times per pt report, pt reports not modifying what he eats and drinks, does modify how he takes big pills  Smoking or chewing tobacco? Former smoker Using fluoride toothpaste daily? Pt has no teeth Last ENT visit was on: Dr. Jenne Pane on 10-03-22, also saw another ENT recently Other  notable issues, if any: Wants to know if radiation was successful based on imaging. Overall they are happy with overall progress. Pt has a stoma.       Vitals:    11/21/22 1458  BP: (!) 151/105  Pulse: 74  Resp: 18  Temp: (!) 97.1 F (36.2 C)  SpO2: 100%    ALLERGIES:  is allergic to succinylcholine, tylenol [acetaminophen], vicodin [hydrocodone-acetaminophen], other, tamsulosin, and protonix [pantoprazole].  Meds: Current Outpatient Medications  Medication Sig Dispense Refill   aspirin EC 81 MG tablet Take 81 mg by mouth daily. Swallow whole.     cyanocobalamin (VITAMIN B12) 500 MCG tablet Take 2 tablets (1,000 mcg total) by mouth daily. 30 tablet 0   diclofenac Sodium (VOLTAREN) 1 % GEL Apply 2 g topically 2 (two) times daily as needed (pain).     finasteride (PROSCAR) 5 MG tablet TAKE 1 TABLET(5 MG) BY MOUTH DAILY 90 tablet 1   folic acid (FOLVITE) 1 MG tablet Take 1 mg by mouth daily.     guaiFENesin (MUCINEX) 600 MG 12 hr tablet Take 600 mg by mouth 2 (two) times daily as needed for cough or to loosen phlegm.     icosapent Ethyl (VASCEPA) 1 g capsule TAKE 2 CAPSULES(2 GRAMS) BY MOUTH TWICE DAILY 120 capsule 11   levETIRAcetam (KEPPRA) 500 MG tablet Take 1 tablet (500 mg total) by mouth 2 (two) times daily. 180 tablet 3   levothyroxine (SYNTHROID) 75 MCG tablet Take 75  Radiation Oncology         [phone] ________________________________  Name: [name] MRN: [id number]  Date: 11/21/2022  DOB: [birth year]  Follow-Up Visit Note  CC: Jerre Simon, MD  Christia Reading, MD  Diagnosis and Prior Radiotherapy:       ICD-10-CM   1. 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 He underwent CT of the neck and chest on 11/14/2022.   CT unfortunately showed abnormal contrast enhancement along the left aspect of the tongue and oropharynx, concerning for recurrent disease; abnormal level 1A lymph nodes, unchanged from 09/28/22 with no new lymphadenopathy; and an irregular solid nodule of the RUL, favored to be metachronous primary bronchogenic carcinoma over metastasis.          Pain issues, if any: 8/10 left face/neck area Using a feeding tube?: used to have feeding tube, now eats everything by mouth, pt does use boost and ensure Weight changes, if any: maintained the same weight the last several months    Wt Readings from Last 3 Encounters:  11/21/22 103 lb 6 oz (46.9 kg)  11/17/22 103 lb (46.7 kg)  11/14/22 103 lb 12.8 oz (47.1 kg)      Swallowing issues, if any: still has difficulty at times per pt report, pt reports not modifying what he eats and drinks, does modify how he takes big pills  Smoking or chewing tobacco? Former smoker Using fluoride toothpaste daily? Pt has no teeth Last ENT visit was on: Dr. Jenne Pane on 10-03-22, also saw another ENT recently Other  notable issues, if any: Wants to know if radiation was successful based on imaging. Overall they are happy with overall progress. Pt has a stoma.       Vitals:    11/21/22 1458  BP: (!) 151/105  Pulse: 74  Resp: 18  Temp: (!) 97.1 F (36.2 C)  SpO2: 100%    ALLERGIES:  is allergic to succinylcholine, tylenol [acetaminophen], vicodin [hydrocodone-acetaminophen], other, tamsulosin, and protonix [pantoprazole].  Meds: Current Outpatient Medications  Medication Sig Dispense Refill   aspirin EC 81 MG tablet Take 81 mg by mouth daily. Swallow whole.     cyanocobalamin (VITAMIN B12) 500 MCG tablet Take 2 tablets (1,000 mcg total) by mouth daily. 30 tablet 0   diclofenac Sodium (VOLTAREN) 1 % GEL Apply 2 g topically 2 (two) times daily as needed (pain).     finasteride (PROSCAR) 5 MG tablet TAKE 1 TABLET(5 MG) BY MOUTH DAILY 90 tablet 1   folic acid (FOLVITE) 1 MG tablet Take 1 mg by mouth daily.     guaiFENesin (MUCINEX) 600 MG 12 hr tablet Take 600 mg by mouth 2 (two) times daily as needed for cough or to loosen phlegm.     icosapent Ethyl (VASCEPA) 1 g capsule TAKE 2 CAPSULES(2 GRAMS) BY MOUTH TWICE DAILY 120 capsule 11   levETIRAcetam (KEPPRA) 500 MG tablet Take 1 tablet (500 mg total) by mouth 2 (two) times daily. 180 tablet 3   levothyroxine (SYNTHROID) 75 MCG tablet Take 75

## 2022-11-22 ENCOUNTER — Ambulatory Visit: Payer: Medicare Other

## 2022-11-22 DIAGNOSIS — C01 Malignant neoplasm of base of tongue: Secondary | ICD-10-CM | POA: Insufficient documentation

## 2022-11-22 DIAGNOSIS — E039 Hypothyroidism, unspecified: Secondary | ICD-10-CM | POA: Insufficient documentation

## 2022-11-22 DIAGNOSIS — Z510 Encounter for antineoplastic radiation therapy: Secondary | ICD-10-CM | POA: Insufficient documentation

## 2022-11-24 ENCOUNTER — Other Ambulatory Visit: Payer: Self-pay

## 2022-11-24 DIAGNOSIS — I6522 Occlusion and stenosis of left carotid artery: Secondary | ICD-10-CM

## 2022-11-24 DIAGNOSIS — I6521 Occlusion and stenosis of right carotid artery: Secondary | ICD-10-CM

## 2022-11-27 ENCOUNTER — Ambulatory Visit: Payer: Medicare Other

## 2022-11-27 ENCOUNTER — Ambulatory Visit
Admission: RE | Admit: 2022-11-27 | Discharge: 2022-11-27 | Disposition: A | Discharge status: Home / Self Care | Payer: Medicare Other | Source: Ambulatory Visit | Attending: Radiation Oncology | Admitting: Radiation Oncology

## 2022-11-27 ENCOUNTER — Other Ambulatory Visit: Payer: Self-pay

## 2022-11-27 DIAGNOSIS — E039 Hypothyroidism, unspecified: Secondary | ICD-10-CM | POA: Diagnosis not present

## 2022-11-27 DIAGNOSIS — C01 Malignant neoplasm of base of tongue: Secondary | ICD-10-CM | POA: Diagnosis not present

## 2022-11-27 DIAGNOSIS — Z510 Encounter for antineoplastic radiation therapy: Secondary | ICD-10-CM | POA: Diagnosis not present

## 2022-11-27 LAB — RAD ONC ARIA SESSION SUMMARY
Course Elapsed Days: 0
Course Elapsed Days: 0
Plan Fractions Treated to Date: 1
Plan Fractions Treated to Date: 2
Plan Prescribed Dose Per Fraction: 3.7 Gy
Plan Prescribed Dose Per Fraction: 3.7 Gy
Plan Total Fractions Prescribed: 4
Plan Total Fractions Prescribed: 4
Plan Total Prescribed Dose: 14.8 Gy
Plan Total Prescribed Dose: 14.8 Gy
Reference Point Dosage Given to Date: 3.7 Gy
Reference Point Dosage Given to Date: 7.4 Gy
Reference Point Session Dosage Given: 3.7 Gy
Reference Point Session Dosage Given: 3.7 Gy
Session Number: 1
Session Number: 2

## 2022-11-28 ENCOUNTER — Ambulatory Visit
Admission: RE | Admit: 2022-11-28 | Discharge: 2022-11-28 | Disposition: A | Discharge status: Home / Self Care | Payer: Medicare Other | Source: Ambulatory Visit | Attending: Radiation Oncology

## 2022-11-28 ENCOUNTER — Other Ambulatory Visit: Payer: Self-pay

## 2022-11-28 ENCOUNTER — Ambulatory Visit: Payer: Medicare Other

## 2022-11-28 DIAGNOSIS — E039 Hypothyroidism, unspecified: Secondary | ICD-10-CM | POA: Diagnosis not present

## 2022-11-28 DIAGNOSIS — C01 Malignant neoplasm of base of tongue: Secondary | ICD-10-CM | POA: Diagnosis not present

## 2022-11-28 DIAGNOSIS — Z510 Encounter for antineoplastic radiation therapy: Secondary | ICD-10-CM | POA: Diagnosis not present

## 2022-11-28 LAB — RAD ONC ARIA SESSION SUMMARY
Course Elapsed Days: 1
Course Elapsed Days: 1
Plan Fractions Treated to Date: 3
Plan Fractions Treated to Date: 4
Plan Prescribed Dose Per Fraction: 3.7 Gy
Plan Prescribed Dose Per Fraction: 3.7 Gy
Plan Total Fractions Prescribed: 4
Plan Total Fractions Prescribed: 4
Plan Total Prescribed Dose: 14.8 Gy
Plan Total Prescribed Dose: 14.8 Gy
Reference Point Dosage Given to Date: 11.1 Gy
Reference Point Dosage Given to Date: 14.8 Gy
Reference Point Session Dosage Given: 3.7 Gy
Reference Point Session Dosage Given: 3.7 Gy
Session Number: 3
Session Number: 4

## 2022-11-29 ENCOUNTER — Ambulatory Visit: Payer: Medicare Other

## 2022-11-29 NOTE — Radiation Completion Notes (Signed)
Patient Name: [name], [name] MRN: [id number] Date of Birth: September 11, 1950 Referring Physician: Christia Reading, M.D. Date of Service: 2022-11-29 Radiation Oncologist: Lonie Peak, M.D. [facility]                             RADIATION ONCOLOGY END OF TREATMENT NOTE     Diagnosis: C01 Malignant neoplasm of base of tongue Staging on 2022-10-06: Malignant neoplasm of base of tongue (HCC) T=cT3, N=cN1, M=cM0 Intent: Palliative     ==========DELIVERED PLANS==========  First Treatment Date: 2022-11-27 - Last Treatment Date: 2022-11-28   Plan Name: HN_BOT#2 Site: Oropharynx Technique: IMRT Mode: Photon Dose Per Fraction: 3.7 Gy Prescribed Dose (Delivered / Prescribed): 14.8 Gy / 14.8 Gy Prescribed Fxs (Delivered / Prescribed): 4 / 4     ==========ON TREATMENT VISIT DATES========== 2022-11-27     ==========UPCOMING VISITS==========       ==========APPENDIX - ON TREATMENT VISIT NOTES==========   See weekly On Treatment Notes in Epic for details.

## 2022-11-30 ENCOUNTER — Ambulatory Visit: Payer: Medicare Other

## 2022-11-30 DIAGNOSIS — E2749 Other adrenocortical insufficiency: Secondary | ICD-10-CM | POA: Diagnosis not present

## 2022-12-12 ENCOUNTER — Ambulatory Visit: Payer: Self-pay | Admitting: Radiation Oncology

## 2022-12-12 NOTE — Progress Notes (Signed)
[name] presents for follow up after completing radiation treatment for base of tongue cancer. He completed treatment on 11-28-22.   Pain issues, if any: 7/10 face pain on left side Using a feeding tube?: does not have Weight changes, if any:  Wt Readings from Last 3 Encounters:  12/26/22 108 lb 12.8 oz (49.4 kg)  11/21/22 103 lb 6 oz (46.9 kg)  11/17/22 103 lb (46.7 kg)    Swallowing issues, if any: eating and drinking well, meds are going well with applesauce Smoking or chewing tobacco? Does not use Using fluoride toothpaste daily? none Last ENT visit was on: none since treatment Other notable issues, if any: trach is doing well. Family wants Dr. Basilio Cairo to look at mouth with mouth pocket concerns.   Vitals:   12/26/22 1509  BP: (!) 132/100  Pulse: 86  Resp: 18  Temp: 98.2 F (36.8 C)  SpO2: 100%

## 2022-12-26 ENCOUNTER — Ambulatory Visit
Admission: RE | Admit: 2022-12-26 | Discharge: 2022-12-26 | Disposition: A | Discharge status: Home / Self Care | Payer: Medicare Other | Source: Ambulatory Visit | Attending: Radiation Oncology | Admitting: Radiation Oncology

## 2022-12-26 ENCOUNTER — Encounter: Payer: Self-pay | Admitting: Radiation Oncology

## 2022-12-26 VITALS — BP 132/100 | HR 86 | Temp 98.2°F | Resp 18 | Ht 71.0 in | Wt 108.8 lb

## 2022-12-26 DIAGNOSIS — C321 Malignant neoplasm of supraglottis: Secondary | ICD-10-CM

## 2022-12-26 DIAGNOSIS — C329 Malignant neoplasm of larynx, unspecified: Secondary | ICD-10-CM

## 2022-12-26 DIAGNOSIS — Z7982 Long term (current) use of aspirin: Secondary | ICD-10-CM | POA: Insufficient documentation

## 2022-12-26 DIAGNOSIS — C01 Malignant neoplasm of base of tongue: Secondary | ICD-10-CM | POA: Insufficient documentation

## 2022-12-26 DIAGNOSIS — Z923 Personal history of irradiation: Secondary | ICD-10-CM | POA: Insufficient documentation

## 2022-12-26 DIAGNOSIS — Z79899 Other long term (current) drug therapy: Secondary | ICD-10-CM | POA: Insufficient documentation

## 2022-12-26 DIAGNOSIS — Z79890 Hormone replacement therapy (postmenopausal): Secondary | ICD-10-CM | POA: Diagnosis not present

## 2022-12-26 NOTE — Progress Notes (Signed)
Radiation Oncology         [phone] ________________________________  Name: [name] MRN: [id number]  Date: 12/26/2022  DOB: 05-Aug-1950  Follow-Up Visit Note  CC: Jerre Simon, MD  Christia Reading, MD  Diagnosis and Prior Radiotherapy:       ICD-10-CM   1. Squamous cell carcinoma of base of tongue (HCC)  C01     2. 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 Malignant neoplasm of base of tongue (HCC)  C01     4. Malignant neoplasm of larynx (HCC)  C32.9        Cancer Staging  Malignant neoplasm of base of tongue (HCC) Staging form: Pharynx - P16 Negative Oropharynx, AJCC 8th Edition - Clinical stage from 10/06/2022: Stage III (cT3, cN1, cM0, p16: Not Assessed, HPV: Not assessed) - Signed by Lonie Peak, MD on 10/06/2022   CHIEF COMPLAINT:  Here for follow-up and surveillance of squamous cell carcinoma of the tongue.  ==========MOST RECENT DELIVERED PLANS - QUADSHOT==========  First Treatment Date: 2022-11-27 - Last Treatment Date: 2022-11-28   Plan Name: HN_BOT#2 Site: Oropharynx Technique: IMRT Mode: Photon Dose Per Fraction: 3.7 Gy Prescribed Dose (Delivered / Prescribed): 14.8 Gy / 14.8 Gy Prescribed Fxs (Delivered / Prescribed): 4 / 4   First Treatment Date: 2022-10-17 - Last Treatment Date: 2022-10-18   Plan Name: HN_BOT Site: Oropharynx Technique: IMRT Mode: Photon Dose Per Fraction: 3.7 Gy Prescribed Dose (Delivered / Prescribed): 14.8 Gy / 14.8 Gy Prescribed Fxs (Delivered / Prescribed): 4 / 4  Narrative:  The patient returns today for routine follow-up. He completed his second "quad shot" of palliative radiotherapy on 11/28/2022.  Pain issues, if any: 7/10 jaw pain on left side. Daughter, who is patient's care taker, states he has been asking for his Roxicodone and magic mouthwash much less.  Using a feeding tube?: does not have Weight changes, if any:     Wt Readings from Last 3  Encounters:  12/26/22 108 lb 12.8 oz (49.4 kg)  11/21/22 103 lb 6 oz (46.9 kg)  11/17/22 103 lb (46.7 kg)  Swallowing issues, if any: eating and drinking well, meds are going well with applesauce Smoking or chewing tobacco? Does not use Using fluoride toothpaste daily? none Last ENT visit was on: none since treatment Other notable issues, if any: trach is doing well. Family wants Dr. Basilio Cairo to look at mouth with mouth pocket concerns.        Vitals:    11/21/22 1458  BP: (!) 151/105  Pulse: 74  Resp: 18  Temp: (!) 97.1 F (36.2 C)  SpO2: 100%    ALLERGIES:  is allergic to succinylcholine, tylenol [acetaminophen], vicodin [hydrocodone-acetaminophen], other, tamsulosin, and protonix [pantoprazole].  Meds: Current Outpatient Medications  Medication Sig Dispense Refill   aspirin EC 81 MG tablet Take 81 mg by mouth daily. Swallow whole.     cyanocobalamin (VITAMIN B12) 500 MCG tablet Take 2 tablets (1,000 mcg total) by mouth daily. 30 tablet 0   diclofenac Sodium (VOLTAREN) 1 % GEL Apply 2 g topically 2 (two) times daily as needed (pain).     finasteride (PROSCAR) 5 MG tablet TAKE 1 TABLET(5 MG) BY MOUTH DAILY 90 tablet 1   folic acid (FOLVITE) 1 MG tablet Take 1 mg by mouth daily.     guaiFENesin (MUCINEX) 600 MG 12 hr tablet Take 600 mg by mouth 2 (two) times daily as needed for cough or to loosen  phlegm.     icosapent Ethyl (VASCEPA) 1 g capsule TAKE 2 CAPSULES(2 GRAMS) BY MOUTH TWICE DAILY 120 capsule 11   levETIRAcetam (KEPPRA) 500 MG tablet Take 1 tablet (500 mg total) by mouth 2 (two) times daily. 180 tablet 3   levothyroxine (SYNTHROID) 75 MCG tablet Take 75 mcg by mouth daily before breakfast.     magic mouthwash (nystatin, lidocaine, diphenhydrAMINE, alum & mag hydroxide) suspension Swish and spit 10 mLs 4 (four) times daily as needed for mouth pain.     midodrine (PROAMATINE) 5 MG tablet Take 2.5 mg by mouth 3 (three) times daily.     oxyCODONE (ROXICODONE) 5 MG/5ML  solution Take 2.5-5 mLs (2.5-5 mg total) by mouth 4 (four) times daily as needed for severe pain. Take with food. 140 mL 0   No current facility-administered medications for this encounter.    Physical Findings: The patient is in no acute distress. Patient is alert and oriented. Wt Readings from Last 3 Encounters:  12/26/22 108 lb 12.8 oz (49.4 kg)  11/21/22 103 lb 6 oz (46.9 kg)  11/17/22 103 lb (46.7 kg)    height is 5\' 11"  (1.803 m) and weight is 108 lb 12.8 oz (49.4 kg). His oral temperature is 98.2 F (36.8 C). His blood pressure is 132/100 (abnormal) and his pulse is 86. His respiration is 18 and oxygen saturation is 100%. .  General: Frail, alert and oriented, in no acute distress. In a wheelchair. HEENT: Head is normocephalic. Extraocular movements are intact. Oropharynx shows no obvious tumor in mouth.   Neck: Neck is notable for stable palpable adenopathy in the submental region, nonbulky. This region is very tender to light palpation. Intact tracheal stoma. Skin: Skin in treatment fields shows satisfactory healing.  Heart: Regular in rate and rhythm with no murmurs, rubs, or gallops. Chest: Clear to auscultation bilaterally, with no rhonchi, wheezes, or rales. Extremities: No cyanosis or edema. Lymphatics: see Neck Exam Psychiatric: Judgment and insight are intact. Affect is appropriate.   Lab Findings: Lab Results  Component Value Date   WBC 5.8 09/19/2022   HGB 11.6 (L) 09/19/2022   HCT 38.5 (L) 09/19/2022   MCV 82.6 09/19/2022   PLT 239 09/19/2022    Lab Results  Component Value Date   TSH 1.512 10/06/2022    Radiographic Findings: No results found.  Impression/Plan:    1) Head and Neck Cancer Status: [name] pain has improved since his radiation treatment. He is using his magic mouthwash much less and has been able to get back to some of his ADLs. He and his daughter are pleased with his palliation from the radiation treatment.   2) Nutritional Status:  stable Wt Readings from Last 3 Encounters:  11/21/22 103 lb 6 oz (46.9 kg)  11/17/22 103 lb (46.7 kg)  11/14/22 103 lb 12.8 oz (47.1 kg)  PEG tube: N/A  3) Risk Factors: The patient has been educated about risk factors including alcohol and tobacco abuse; they understand that avoidance of alcohol and tobacco is important to prevent recurrences as well as other cancers  4) Swallowing: Still has some difficulty, modifying his intake for this.   6) Thyroid function:  Lab Results  Component Value Date   TSH 1.512 10/06/2022   7) Patient has responded well to his second "quad shot". A third course of radiation will likely provide minimal benefit and does come with some risks or permanent tissue injury. Therefore, additional radiation is not recommended at this time. The patient  and his daughter understand this and are in agreement. Radiation follow-up PRN -continue to follow closely with palliative care.  The patient and his daughter are also interested in pursuing physical therapy to see if this can help with some of his pain; he has a lot of fibrosis in his neck and it is unclear whether some physical therapy interventions would be of benefit..  We will make a referral.  We appreciate the opportunity to partake in this patient's care. He knows to call with any questions or concerns.   On date of service, in total, I spent 20 minutes on this encounter. Patient was seen in person. Note signed after encounter date; minutes pertain to date of service, only.  _____________________________________   Joyice Faster, PA-C  Lonie Peak, MD

## 2022-12-29 ENCOUNTER — Other Ambulatory Visit: Payer: Self-pay

## 2022-12-29 DIAGNOSIS — C01 Malignant neoplasm of base of tongue: Secondary | ICD-10-CM

## 2023-01-18 NOTE — Progress Notes (Signed)
Oncology Nurse Navigator Documentation   During [name] follow up with Dr. Basilio Cairo on 10/22 a Physical therapy referral was discussed and placed. While following up on this referral I do not see that he has been scheduled yet so I called his daughter [name] today and left a VM with the direct line to [facility]. I also provided her with my direct contact number to call me back if she had any questions.   Hedda Slade RN, BSN, OCN Head & Neck Oncology Nurse Navigator [facility] Phone # [phone]  Fax # [phone]

## 2023-01-23 ENCOUNTER — Other Ambulatory Visit: Payer: Self-pay | Admitting: Emergency Medicine

## 2023-01-23 DIAGNOSIS — C329 Malignant neoplasm of larynx, unspecified: Secondary | ICD-10-CM

## 2023-01-23 NOTE — Progress Notes (Signed)
Patients daughter call requesting to have her fathers physical therapy with [facility] instead of [facility]. New referral was put in . Rn will notify the patient.

## 2023-01-24 ENCOUNTER — Other Ambulatory Visit: Payer: Self-pay

## 2023-01-24 DIAGNOSIS — R339 Retention of urine, unspecified: Secondary | ICD-10-CM

## 2023-01-24 MED ORDER — FINASTERIDE 5 MG PO TABS: 5.0000 mg | ORAL_TABLET | Freq: Every day | ORAL | 11 refills | Status: DC

## 2023-01-24 NOTE — Progress Notes (Signed)
-  Refilled Finasteride

## 2023-01-26 DIAGNOSIS — H6123 Impacted cerumen, bilateral: Secondary | ICD-10-CM | POA: Diagnosis not present

## 2023-01-26 DIAGNOSIS — Z923 Personal history of irradiation: Secondary | ICD-10-CM | POA: Diagnosis not present

## 2023-01-26 DIAGNOSIS — L929 Granulomatous disorder of the skin and subcutaneous tissue, unspecified: Secondary | ICD-10-CM | POA: Diagnosis not present

## 2023-01-26 DIAGNOSIS — C01 Malignant neoplasm of base of tongue: Secondary | ICD-10-CM | POA: Diagnosis not present

## 2023-02-08 ENCOUNTER — Inpatient Hospital Stay: Payer: Medicare Other | Attending: Oncology | Admitting: Oncology

## 2023-02-08 VITALS — BP 142/86 | HR 62 | Temp 98.1°F | Resp 18 | Ht 71.0 in | Wt 104.0 lb

## 2023-02-08 DIAGNOSIS — I509 Heart failure, unspecified: Secondary | ICD-10-CM | POA: Insufficient documentation

## 2023-02-08 DIAGNOSIS — I110 Hypertensive heart disease with heart failure: Secondary | ICD-10-CM | POA: Insufficient documentation

## 2023-02-08 DIAGNOSIS — Z8521 Personal history of malignant neoplasm of larynx: Secondary | ICD-10-CM | POA: Insufficient documentation

## 2023-02-08 DIAGNOSIS — R911 Solitary pulmonary nodule: Secondary | ICD-10-CM | POA: Insufficient documentation

## 2023-02-08 DIAGNOSIS — C01 Malignant neoplasm of base of tongue: Secondary | ICD-10-CM | POA: Insufficient documentation

## 2023-02-08 DIAGNOSIS — R6884 Jaw pain: Secondary | ICD-10-CM | POA: Insufficient documentation

## 2023-02-08 DIAGNOSIS — K7460 Unspecified cirrhosis of liver: Secondary | ICD-10-CM | POA: Insufficient documentation

## 2023-02-08 DIAGNOSIS — E039 Hypothyroidism, unspecified: Secondary | ICD-10-CM | POA: Diagnosis not present

## 2023-02-08 DIAGNOSIS — E46 Unspecified protein-calorie malnutrition: Secondary | ICD-10-CM | POA: Insufficient documentation

## 2023-02-08 NOTE — Progress Notes (Signed)
[facility] OFFICE PROGRESS NOTE   Diagnosis: Head and neck cancer  INTERVAL HISTORY:   [name] returns for scheduled visit.  He completed palliative radiation to the oropharynx in August and September.  He reports improvement in pain following the radiation. He is followed by ENT with a follow-up exam 01/26/2023 revealing no evidence of residual disease.  He underwent removal of a cerumen impaction at the left ear and is being treated for an infection of the left ear.  He has been evaluated by endocrinology for hypothyroidism and secondary adrenal insufficiency.  He has pain at the left ear.  He reports a good appetite.  Objective:  Vital signs in last 24 hours:  Blood pressure (!) 142/86, pulse 62, temperature 98.1 F (36.7 C), temperature source Temporal, resp. rate 18, height 5\' 11"  (1.803 m), weight 104 lb (47.2 kg), SpO2 93%.    HEENT: Oral cavity without visible mass Lymphatics: No cervical, supraclavicular, axillary, or inguinal nodes Resp: Lungs clear bilaterally, tracheostomy Cardio: Regular rate and rhythm GI: No hepatosplenomegaly Vascular: No leg edema    Lab Results:  Lab Results  Component Value Date   WBC 5.8 09/19/2022   HGB 11.6 (L) 09/19/2022   HCT 38.5 (L) 09/19/2022   MCV 82.6 09/19/2022   PLT 239 09/19/2022   NEUTROABS 11.1 (H) 04/15/2022    CMP  Lab Results  Component Value Date   NA 136 09/19/2022   K 3.9 09/19/2022   CL 104 09/19/2022   CO2 24 09/19/2022   GLUCOSE 84 09/19/2022   BUN 15 09/19/2022   CREATININE 1.10 11/14/2022   CALCIUM 8.8 (L) 09/19/2022   PROT 7.3 05/08/2022   ALBUMIN 2.7 (L) 05/08/2022   AST 21 05/08/2022   ALT 16 05/08/2022   ALKPHOS 81 05/08/2022   BILITOT 0.7 05/08/2022   GFRNONAA >60 09/19/2022   GFRAA >60 07/23/2019    No results found for: "CEA1", "CEA", "ZOX096", "CA125"  Lab Results  Component Value Date   INR 1.3 (H) 04/14/2022   LABPROT 16.1 (H) 04/14/2022    Imaging:  No  results found.  Medications: I have reviewed the patient's current medications.   Assessment/Plan:  Squamous cell carcinoma of the base of the tongue stage III (cT3,cN1,cM0), p16 not assessed Base of the tongue mass-invasive moderately differentiated squamous cell carcinoma on biopsy 09/19/2022 09/28/2022-PET,8 mm right upper lobe nodule with mild hypermetabolism, stable compared to 04/14/2022.  Hypermetabolic left tongue base primary, hypermetabolic anterior submental node measuring 1.3 cm, mild hypermetabolism in the mediastinum and bilateral hila favored to be physiologic Quad shot radiation August and September 2024 with improvement in pain CT neck 11/14/2022: Abnormal enhancement at the left aspect of the tongue and oropharynx with irregularity at the surface of the tongue, new from 04/14/2022, abnormal round enhancing level 1A lymph nodes unchanged from 09/28/2022 PET, no new lymphadenopathy Stage III (pT3 N0 M0) squamous cell carcinoma of the supraglottic larynx, 08/10/2009 Total laryngectomy with bilateral neck dissection 09/16/2009 August 2011-October 2011-postoperative radiation therapy alone, 60 Gray in 30 fractions  3.   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  History of heavy alcohol use 5.  Cirrhosis 6.  History of pancreatitis 7.  CHF 8.  Seizure disorder 9.  Hypothyroidism 10.  Hearing loss 11.  Tracheostomy post total laryngectomy  12.  History of avascular necrosis of the right hip 13.  Frontal subarachnoid hemorrhage 2021 14.  Hypertension/hypotension 15.  Anorexia/malnutrition 16.  Right upper lobe nodule on PET 09/28/2022-stable from 04/14/2022, mild hypermetabolism CT chest 11/14/2022: Slight increase in size of right upper lobe nodule, potentially  related to differences in slice thickness, other small pulmonary nodules are stable 17.  Left-sided face/jaw pain-likely secondary to #1    Disposition: [name] completed palliative radiation to the base of the tongue lesion in August and September.  There has been clinical improvement following radiation.  He continues follow-up with ENT.  [name] has multiple comorbid conditions.  It is unclear whether he would be a candidate for systemic therapy if he develops disease progression.  The right lung nodule is most likely an indolent primary bronchogenic carcinoma as opposed to a metastasis.  He will return for an office visit and repeat chest CT in 3 months.  Thornton Papas, MD  02/08/2023  2:59 PM

## 2023-02-10 DIAGNOSIS — R519 Headache, unspecified: Secondary | ICD-10-CM | POA: Diagnosis not present

## 2023-02-10 DIAGNOSIS — H6692 Otitis media, unspecified, left ear: Secondary | ICD-10-CM | POA: Diagnosis not present

## 2023-02-13 DIAGNOSIS — E2749 Other adrenocortical insufficiency: Secondary | ICD-10-CM | POA: Diagnosis not present

## 2023-02-13 DIAGNOSIS — I951 Orthostatic hypotension: Secondary | ICD-10-CM | POA: Diagnosis not present

## 2023-02-13 DIAGNOSIS — E039 Hypothyroidism, unspecified: Secondary | ICD-10-CM | POA: Diagnosis not present

## 2023-02-20 DIAGNOSIS — C01 Malignant neoplasm of base of tongue: Secondary | ICD-10-CM | POA: Diagnosis not present

## 2023-02-20 DIAGNOSIS — L929 Granulomatous disorder of the skin and subcutaneous tissue, unspecified: Secondary | ICD-10-CM | POA: Diagnosis not present

## 2023-03-02 DIAGNOSIS — Z79899 Other long term (current) drug therapy: Secondary | ICD-10-CM | POA: Diagnosis not present

## 2023-03-13 ENCOUNTER — Other Ambulatory Visit: Payer: Self-pay

## 2023-03-13 ENCOUNTER — Encounter (HOSPITAL_BASED_OUTPATIENT_CLINIC_OR_DEPARTMENT_OTHER): Payer: Self-pay | Admitting: Emergency Medicine

## 2023-03-13 ENCOUNTER — Emergency Department (HOSPITAL_BASED_OUTPATIENT_CLINIC_OR_DEPARTMENT_OTHER): Payer: Medicare Other

## 2023-03-13 ENCOUNTER — Emergency Department (HOSPITAL_BASED_OUTPATIENT_CLINIC_OR_DEPARTMENT_OTHER)
Admission: EM | Admit: 2023-03-13 | Discharge: 2023-03-13 | Disposition: A | Discharge status: Home / Self Care | Payer: Medicare Other

## 2023-03-13 DIAGNOSIS — I10 Essential (primary) hypertension: Secondary | ICD-10-CM | POA: Insufficient documentation

## 2023-03-13 DIAGNOSIS — Z85819 Personal history of malignant neoplasm of unspecified site of lip, oral cavity, and pharynx: Secondary | ICD-10-CM | POA: Insufficient documentation

## 2023-03-13 DIAGNOSIS — R519 Headache, unspecified: Secondary | ICD-10-CM

## 2023-03-13 DIAGNOSIS — Z7982 Long term (current) use of aspirin: Secondary | ICD-10-CM | POA: Insufficient documentation

## 2023-03-13 LAB — COMPREHENSIVE METABOLIC PANEL
ALT: <5 U/L (ref 0–44)
AST: 16 U/L (ref 15–41)
Albumin: 3.9 g/dL (ref 3.5–5.0)
Alkaline Phosphatase: 97 U/L (ref 38–126)
Anion gap: 9 (ref 5–15)
BUN: 12 mg/dL (ref 8–23)
CO2: 28 mmol/L (ref 22–32)
Calcium: 9.3 mg/dL (ref 8.9–10.3)
Chloride: 94 mmol/L — ABNORMAL LOW (ref 98–111)
Creatinine, Ser: 1.02 mg/dL (ref 0.61–1.24)
GFR, Estimated: >60 mL/min (ref >60)
Glucose, Bld: 85 mg/dL (ref 70–99)
Potassium: 4.7 mmol/L (ref 3.5–5.1)
Sodium: 131 mmol/L — ABNORMAL LOW (ref 135–145)
Total Bilirubin: 1.2 mg/dL (ref 0.0–1.2)
Total Protein: 9.4 g/dL — ABNORMAL HIGH (ref 6.5–8.1)

## 2023-03-13 LAB — CBC WITH DIFFERENTIAL/PLATELET
Abs Immature Granulocytes: 0.03 10*3/uL (ref 0.00–0.07)
Basophils Absolute: 0 10*3/uL (ref 0.0–0.1)
Basophils Relative: 0 %
Eosinophils Absolute: 0.1 10*3/uL (ref 0.0–0.5)
Eosinophils Relative: 2 %
HCT: 40 % (ref 39.0–52.0)
Hemoglobin: 12.6 g/dL — ABNORMAL LOW (ref 13.0–17.0)
Immature Granulocytes: 1 %
Lymphocytes Relative: 24 %
Lymphs Abs: 1.1 10*3/uL (ref 0.7–4.0)
MCH: 26.1 pg (ref 26.0–34.0)
MCHC: 31.5 g/dL (ref 30.0–36.0)
MCV: 82.8 fL (ref 80.0–100.0)
Monocytes Absolute: 0.6 10*3/uL (ref 0.1–1.0)
Monocytes Relative: 12 %
Neutro Abs: 2.8 10*3/uL (ref 1.7–7.7)
Neutrophils Relative %: 61 %
Platelets: 240 10*3/uL (ref 150–400)
RBC: 4.83 MIL/uL (ref 4.22–5.81)
RDW: 14.4 % (ref 11.5–15.5)
WBC: 4.6 10*3/uL (ref 4.0–10.5)
nRBC: 0 % (ref 0.0–0.2)

## 2023-03-13 MED ORDER — OXYCODONE HCL 5 MG PO TABS
5.0000 mg | ORAL_TABLET | Freq: Once | ORAL | Status: AC
  Administered 2023-03-13: 5 mg via ORAL
  Filled 2023-03-13: qty 1

## 2023-03-13 MED ORDER — HYDRALAZINE HCL 20 MG/ML IJ SOLN
5.0000 mg | Freq: Once | INTRAMUSCULAR | Status: AC
  Administered 2023-03-13: 5 mg via INTRAVENOUS
  Filled 2023-03-13: qty 1

## 2023-03-13 MED ORDER — LABETALOL HCL 5 MG/ML IV SOLN
10.0000 mg | Freq: Once | INTRAVENOUS | Status: AC
  Administered 2023-03-13: 10 mg via INTRAVENOUS
  Filled 2023-03-13: qty 4

## 2023-03-13 MED ORDER — KETOROLAC TROMETHAMINE 15 MG/ML IJ SOLN
15.0000 mg | Freq: Once | INTRAMUSCULAR | Status: AC
  Administered 2023-03-13: 15 mg via INTRAVENOUS
  Filled 2023-03-13: qty 1

## 2023-03-13 NOTE — ED Provider Notes (Signed)
 [place] EMERGENCY DEPARTMENT AT [facility] Provider Note   CSN: [id number] Arrival date & time: 03/13/23  1403     History  Chief Complaint  Patient presents with   Headache    [name] is a 73 y.o. male.  Patient with history of throat/oral cancer, completed radiation last year. BIB family with concern today for headache involving left head and face. No fever, vomiting. Per family, he stopped eating yesterday secondary to pain. No vomiting. No swelling. They also voice a concern for persistently elevated blood pressure for the past week. No chest pain, SOB. Family reports he has a history of hypotension, not treated for hypertension.   The history is provided by the patient. No language interpreter was used.  Headache      Home Medications Prior to Admission medications   Medication Sig Start Date End Date Taking? Authorizing Provider  fludrocortisone  (FLORINEF ) 0.1 MG tablet Take by mouth. 02/16/23  Yes [provider]  folic acid  (FOLVITE ) 1 MG tablet Take 1 mg by mouth daily. 02/13/23  Yes [provider]  rosuvastatin  (CRESTOR ) 5 MG tablet Take 5 mg by mouth at bedtime. 03/12/23  Yes [provider]  aspirin  EC 81 MG tablet Take 81 mg by mouth daily. Swallow whole.    [provider]  cyanocobalamin  (VITAMIN B12) 500 MCG tablet Take 2 tablets (1,000 mcg total) by mouth daily. 04/18/22   Regalado, Belkys A, MD  diclofenac  Sodium (VOLTAREN ) 1 % GEL Apply 2 g topically 2 (two) times daily as needed (pain).    [provider]  finasteride  (PROSCAR ) 5 MG tablet Take 1 tablet (5 mg total) by mouth daily. 01/24/23 01/24/24  Rosendo Rush, MD  guaiFENesin  (MUCINEX ) 600 MG 12 hr tablet Take 600 mg by mouth 2 (two) times daily as needed for cough or to loosen phlegm.    [provider]  icosapent  Ethyl (VASCEPA ) 1 g capsule TAKE 2 CAPSULES(2 GRAMS) BY MOUTH TWICE DAILY 06/27/22   Macario Dorothyann HERO, MD   levETIRAcetam  (KEPPRA ) 500 MG tablet Take 1 tablet (500 mg total) by mouth 2 (two) times daily. 11/14/22   Whitfield Raisin, NP  levothyroxine  (SYNTHROID ) 75 MCG tablet Take 75 mcg by mouth daily before breakfast. 09/22/22   [provider]  NEOMYCIN-POLYMYXIN-HYDROCORTISONE  (CORTISPORIN) 1 % SOLN OTIC solution Place 4 drops into the left ear in the morning, at noon, and at bedtime. 01/26/23   [provider]  oxyCODONE  (ROXICODONE ) 5 MG/5ML solution Take 2.5-5 mLs (2.5-5 mg total) by mouth 4 (four) times daily as needed for severe pain. Take with food. 10/06/22   Izell Domino, MD      Allergies    Succinylcholine, Tylenol  Dulce.dove ], Vicodin [hydrocodone-acetaminophen ], Other, Tamsulosin , and Protonix  [pantoprazole ]    Review of Systems   Review of Systems  Neurological:  Positive for headaches.    Physical Exam Updated Vital Signs BP 115/85   Pulse 82   Temp 98.4 F (36.9 C) (Oral)   Resp 17   Ht 5' 11 (1.803 m)   Wt 47 kg   SpO2 96%   BMI 14.45 kg/m  Physical Exam Vitals and nursing note reviewed.  Constitutional:      Appearance: He is well-developed.  HENT:     Head: Normocephalic.     Comments: No facial swelling or redness. Edentulous.  Eyes:     Extraocular Movements: Extraocular movements intact.     Pupils: Pupils are equal, round, and reactive to light.  Neck:  Comments: Scarring over anterolateral left neck. No redness, nontender.  Abdominal:     General: There is no distension.  Musculoskeletal:     Cervical back: Normal range of motion and neck supple.  Neurological:     Mental Status: He is alert.     GCS: GCS eye subscore is 4. GCS verbal subscore is 5. GCS motor subscore is 6.     Sensory: No sensory deficit.     Motor: No weakness.     ED Results / Procedures / Treatments   Labs (all labs ordered are listed, but only abnormal results are displayed) Labs Reviewed  CBC WITH DIFFERENTIAL/PLATELET - Abnormal; Notable for the  following components:      Result Value   Hemoglobin 12.6 (*)    All other components within normal limits  COMPREHENSIVE METABOLIC PANEL - Abnormal; Notable for the following components:   Sodium 131 (*)    Chloride 94 (*)    Total Protein 9.4 (*)    All other components within normal limits    EKG None  Radiology CT Head Wo Contrast Result Date: 03/13/2023 CLINICAL DATA:  Facial pain, history of head and neck cancer. Headache, new onset (Age >= 51y). EXAM: CT HEAD WITHOUT CONTRAST CT MAXILLOFACIAL WITHOUT CONTRAST TECHNIQUE: Multidetector CT imaging of the head and maxillofacial structures were performed using the standard protocol without intravenous contrast. Multiplanar CT image reconstructions of the maxillofacial structures were also generated. RADIATION DOSE REDUCTION: This exam was performed according to the departmental dose-optimization program which includes automated exposure control, adjustment of the mA and/or kV according to patient size and/or use of iterative reconstruction technique. COMPARISON:  Head CT 05/24/2022.  Neck CT 11/14/2022. FINDINGS: CT HEAD FINDINGS Brain: There is no evidence of an acute infarct, intracranial hemorrhage, midline shift, or extra-axial fluid collection. 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 There is mild cerebral atrophy. Cerebral white matter hypodensities are unchanged and nonspecific but compatible with mild chronic small vessel ischemic disease, with a small chronic infarct again noted in the left corona radiata/basal ganglia. Vascular: Calcified atherosclerosis at the skull base. No hyperdense vessel. Skull: No acute fracture or suspicious osseous lesion. Other: None. CT MAXILLOFACIAL FINDINGS Osseous: No acute fracture or interval bone destruction. Edentulous. Unchanged erosions involving the left maxillary alveolar ridge. Orbits: Remote medial right orbital fracture. Sinuses: Minimal  right sphenoid and right maxillary sinus mucosal thickening. Trace left mastoid fluid. Soft tissues: Partially visualized post treatment changes in the neck. Limited assessment of the patient's known tongue base malignancy in the absence of IV contrast. Interval decreased submental lymphadenopathy. IMPRESSION: 1. No evidence of acute intracranial abnormality. 2. Unchanged planum sphenoidale meningioma. 3. No acute maxillofacial fracture or interval bone destruction. 4. Partially visualized post treatment changes in the neck. Electronically Signed   By: Dasie Hamburg M.D.   On: 03/13/2023 17:03   CT Maxillofacial Wo Contrast Result Date: 03/13/2023 CLINICAL DATA:  Facial pain, history of head and neck cancer. Headache, new onset (Age >= 51y). EXAM: CT HEAD WITHOUT CONTRAST CT MAXILLOFACIAL WITHOUT CONTRAST TECHNIQUE: Multidetector CT imaging of the head and maxillofacial structures were performed using the standard protocol without intravenous contrast. Multiplanar CT image reconstructions of the maxillofacial structures were also generated. RADIATION DOSE REDUCTION: This exam was performed according to the departmental dose-optimization program which includes automated exposure control, adjustment of the mA and/or kV according to patient size and/or use of iterative reconstruction technique. COMPARISON:  Head CT 05/24/2022.  Neck CT 11/14/2022. FINDINGS: CT HEAD FINDINGS Brain: There is no evidence of an acute infarct, intracranial hemorrhage, midline shift, or extra-axial fluid collection. 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 There is mild cerebral atrophy. Cerebral white matter hypodensities are unchanged and nonspecific but compatible with mild chronic small vessel ischemic disease, with a small chronic infarct again noted in the left corona radiata/basal ganglia. Vascular: Calcified atherosclerosis at the skull base. No hyperdense vessel. Skull: No  acute fracture or suspicious osseous lesion. Other: None. CT MAXILLOFACIAL FINDINGS Osseous: No acute fracture or interval bone destruction. Edentulous. Unchanged erosions involving the left maxillary alveolar ridge. Orbits: Remote medial right orbital fracture. Sinuses: Minimal right sphenoid and right maxillary sinus mucosal thickening. Trace left mastoid fluid. Soft tissues: Partially visualized post treatment changes in the neck. Limited assessment of the patient's known tongue base malignancy in the absence of IV contrast. Interval decreased submental lymphadenopathy. IMPRESSION: 1. No evidence of acute intracranial abnormality. 2. Unchanged planum sphenoidale meningioma. 3. No acute maxillofacial fracture or interval bone destruction. 4. Partially visualized post treatment changes in the neck. Electronically Signed   By: Dasie Hamburg M.D.   On: 03/13/2023 17:03    Procedures Procedures    Medications Ordered in ED Medications  oxyCODONE  (Oxy IR/ROXICODONE ) immediate release tablet 5 mg (5 mg Oral Given 03/13/23 1646)  hydrALAZINE  (APRESOLINE ) injection 5 mg (5 mg Intravenous Given 03/13/23 1722)  labetalol  (NORMODYNE ) injection 10 mg (10 mg Intravenous Given 03/13/23 1827)  ketorolac  (TORADOL ) 15 MG/ML injection 15 mg (15 mg Intravenous Given 03/13/23 1825)    ED Course/ Medical Decision Making/ A&P                                 Medical Decision Making [name] blood pressure has risen sharply with multiple reading of 214/141. IV started, labs ordered. He has a history of hypotension. IV hydralazine  5 mg ordered.  Not much improvement with hydralazine  5 mg. Seen by Dr. Ula. No renal abnormality. Toradol  ordered for headache. Labetalol  for blood pressure.   1900 - patient sleeping on recheck. BP 127/88. Will continue to observe given history of hypotension.   20:20 - blood pressure stable, normal. Headache is improved. Discussed with dr. Ula and patient is felt stable for discharge home.  Attempted to call contact, sister Clotilda, left message.   Amount and/or Complexity of Data Reviewed Independent Historian: caregiver    Details: Caregiver providing history - patient nonverbal.  Labs: ordered.    Details: Normal renal function; no leukocytosis, normal hgb Radiology: ordered.    Details: No acute findings on head and maxillofacial CT  Risk Prescription drug management.           Final Clinical Impression(s) / ED Diagnoses Final diagnoses:  Hypertension, unspecified type  Acute nonintractable headache, unspecified headache type    Rx / DC Orders ED Discharge Orders     None         [name] 03/13/23 2033    Ula Prentice SAUNDERS, MD 03/13/23 [phone]

## 2023-03-13 NOTE — ED Triage Notes (Signed)
 C/o headache and HTN x 3 days. Recently dx of ear infection on same side of headache. Denies any blurred vision or unilateral defects.

## 2023-03-13 NOTE — Discharge Instructions (Signed)
 Follow up closely with your doctor for recheck of headache pain and blood pressure check.   Return to the ED with any new or concerning symptoms at any time.

## 2023-03-14 ENCOUNTER — Telehealth: Payer: Self-pay

## 2023-03-14 NOTE — Transitions of Care (Post Inpatient/ED Visit) (Signed)
 03/14/2023  Name: [name] MRN: [id number] DOB: 06/21/1950  Today's TOC FU Call Status: Today's TOC FU Call Status:: Successful TOC FU Call Completed TOC FU Call Complete Date: 03/14/23 [name] Name and Date of Birth confirmed.  Transition Care Management Follow-up Telephone Call Date of Discharge: 03/13/23 Discharge Facility: [facility] ([facility]) Type of Discharge: Emergency Department Reason for ED Visit: Other: (headache) How have you been since you were released from the hospital?: Better Any questions or concerns?: No  Items Reviewed: Did you receive and understand the discharge instructions provided?: Yes Medications obtained,verified, and reconciled?: Yes (Medications Reviewed) Any new allergies since your discharge?: No Dietary orders reviewed?: Yes Do you have support at home?: Yes People in Home: child(ren), adult  Medications Reviewed Today: Medications Reviewed Today     Reviewed by Emmitt Pan, LPN (Licensed Practical Nurse) on 03/14/23 at 1145  Med List Status: <None>   Medication Order Taking? Sig Documenting Provider Last Dose Status Informant  aspirin  EC 81 MG tablet [id number] No Take 81 mg by mouth daily. Swallow whole. [provider] Taking Active Family Member  cyanocobalamin  (VITAMIN B12) 500 MCG tablet [id number] No Take 2 tablets (1,000 mcg total) by mouth daily. Regalado, Belkys A, MD Taking Active Family Member  diclofenac  Sodium (VOLTAREN ) 1 % GEL [id number] No Apply 2 g topically 2 (two) times daily as needed (pain). [provider] Taking Active Family Member  finasteride  (PROSCAR ) 5 MG tablet [id number] No Take 1 tablet (5 mg total) by mouth daily. Rosendo Rush, MD Taking Active   fludrocortisone  (FLORINEF ) 0.1 MG tablet [id number]  Take by mouth. [provider]  Active   folic acid  (FOLVITE ) 1 MG tablet [id number]  Take 1 mg by mouth daily. [provider]  Active   guaiFENesin  (MUCINEX )  600 MG 12 hr tablet [id number] No Take 600 mg by mouth 2 (two) times daily as needed for cough or to loosen phlegm. [provider] Taking Active Family Member  icosapent  Ethyl (VASCEPA ) 1 g capsule [id number] No TAKE 2 CAPSULES(2 GRAMS) BY MOUTH TWICE DAILY Macario Dorothyann HERO, MD Taking Active Family Member  levETIRAcetam  (KEPPRA ) 500 MG tablet [id number] No Take 1 tablet (500 mg total) by mouth 2 (two) times daily. Whitfield Raisin, NP Taking Active   levothyroxine  (SYNTHROID ) 75 MCG tablet [id number] No Take 75 mcg by mouth daily before breakfast. [provider] Taking Active   NEOMYCIN-POLYMYXIN-HYDROCORTISONE  (CORTISPORIN) 1 % SOLN OTIC solution [id number]  Place 4 drops into the left ear in the morning, at noon, and at bedtime. [provider]  Active   oxyCODONE  (ROXICODONE ) 5 MG/5ML solution [id number] No Take 2.5-5 mLs (2.5-5 mg total) by mouth 4 (four) times daily as needed for severe pain. Take with food. Izell Domino, MD Taking Active   rosuvastatin  (CRESTOR ) 5 MG tablet [id number]  Take 5 mg by mouth at bedtime. [provider]  Active   Med List Note Earlie Lucienne RAMAN, CPhT 05/04/14 1502): [name] Snipes- [phone] daughter            Home Care and Equipment/Supplies: Were Home Health Services Ordered?: NA Any new equipment or medical supplies ordered?: NA  Functional Questionnaire: Do you need assistance with bathing/showering or dressing?: No Do you need assistance with meal preparation?: No Do you need assistance with eating?: No Do you have difficulty maintaining continence: No Do you need assistance with getting out of bed/getting out of a chair/moving?: No Do you have difficulty managing or taking your medications?: No  Follow  up appointments reviewed: PCP Follow-up appointment confirmed?: No (patient at [facility]) MD Provider Line Number:[phone] Given: No Specialist Hospital Follow-up appointment confirmed?: NA Do you need  transportation to your follow-up appointment?: No Do you understand care options if your condition(s) worsen?: Yes-patient verbalized understanding    SIGNATURE Julian Lemmings, LPN [facility] Nurse Health Advisor Direct Dial [phone]

## 2023-03-20 ENCOUNTER — Encounter: Payer: Self-pay | Admitting: Oncology

## 2023-03-21 ENCOUNTER — Other Ambulatory Visit: Payer: Self-pay | Admitting: *Deleted

## 2023-03-21 DIAGNOSIS — C01 Malignant neoplasm of base of tongue: Secondary | ICD-10-CM

## 2023-03-21 NOTE — Progress Notes (Signed)
 Received MyChart message that patient is having persistent headaches and pain left side face neck. Asking for pain medication. Dr. Scherrie Curt recommends a referral to Palliative Clinic at [facility] for cancer pain management. Made family member Sheena Dennis aware of referral.

## 2023-03-22 ENCOUNTER — Telehealth: Payer: Self-pay | Admitting: Nurse Practitioner

## 2023-03-22 NOTE — Telephone Encounter (Signed)
Called and left VM for the patients daughter Mrs.Carollee Herter with appointment details.

## 2023-03-28 ENCOUNTER — Telehealth: Payer: Self-pay | Admitting: *Deleted

## 2023-03-28 NOTE — Telephone Encounter (Signed)
LVM for daughter with CT appointment on 05/09/23 at 0945/1000 and MD visit following at [facility]. Requested return call with any questions.
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
## 2023-04-05 ENCOUNTER — Inpatient Hospital Stay: Payer: Medicare Other | Attending: Oncology | Admitting: Nurse Practitioner

## 2023-04-05 ENCOUNTER — Telehealth: Payer: Self-pay | Admitting: Nurse Practitioner

## 2023-04-05 NOTE — Telephone Encounter (Signed)
[name] daughter is aware of scheduled appointment times/dates for Palliative Care
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
## 2023-04-16 ENCOUNTER — Inpatient Hospital Stay (HOSPITAL_COMMUNITY)
Admission: EM | Admit: 2023-04-16 | Discharge: 2023-04-19 | DRG: 312 | Disposition: A | Discharge status: Home Health | Payer: Medicare Other | Attending: Internal Medicine | Admitting: Internal Medicine

## 2023-04-16 ENCOUNTER — Encounter (HOSPITAL_COMMUNITY): Payer: Self-pay

## 2023-04-16 ENCOUNTER — Other Ambulatory Visit: Payer: Self-pay

## 2023-04-16 ENCOUNTER — Emergency Department (HOSPITAL_COMMUNITY): Payer: Medicare Other

## 2023-04-16 ENCOUNTER — Encounter: Payer: Self-pay | Admitting: Nurse Practitioner

## 2023-04-16 ENCOUNTER — Inpatient Hospital Stay: Payer: Medicare Other | Attending: Oncology | Admitting: Nurse Practitioner

## 2023-04-16 ENCOUNTER — Observation Stay (HOSPITAL_COMMUNITY): Payer: Medicare Other

## 2023-04-16 VITALS — BP 85/70 | HR 67 | Temp 98.0°F | Resp 18 | Ht 71.0 in | Wt 95.1 lb

## 2023-04-16 DIAGNOSIS — R569 Unspecified convulsions: Secondary | ICD-10-CM | POA: Diagnosis not present

## 2023-04-16 DIAGNOSIS — Z85810 Personal history of malignant neoplasm of tongue: Secondary | ICD-10-CM

## 2023-04-16 DIAGNOSIS — Z825 Family history of asthma and other chronic lower respiratory diseases: Secondary | ICD-10-CM

## 2023-04-16 DIAGNOSIS — E8809 Other disorders of plasma-protein metabolism, not elsewhere classified: Secondary | ICD-10-CM | POA: Diagnosis not present

## 2023-04-16 DIAGNOSIS — F1010 Alcohol abuse, uncomplicated: Secondary | ICD-10-CM | POA: Diagnosis present

## 2023-04-16 DIAGNOSIS — C01 Malignant neoplasm of base of tongue: Secondary | ICD-10-CM | POA: Diagnosis present

## 2023-04-16 DIAGNOSIS — N1831 Chronic kidney disease, stage 3a: Secondary | ICD-10-CM | POA: Diagnosis not present

## 2023-04-16 DIAGNOSIS — Z96641 Presence of right artificial hip joint: Secondary | ICD-10-CM | POA: Diagnosis present

## 2023-04-16 DIAGNOSIS — Z681 Body mass index (BMI) 19 or less, adult: Secondary | ICD-10-CM | POA: Diagnosis not present

## 2023-04-16 DIAGNOSIS — Z7189 Other specified counseling: Secondary | ICD-10-CM

## 2023-04-16 DIAGNOSIS — R627 Adult failure to thrive: Secondary | ICD-10-CM | POA: Diagnosis not present

## 2023-04-16 DIAGNOSIS — Y798 Miscellaneous orthopedic devices associated with adverse incidents, not elsewhere classified: Secondary | ICD-10-CM | POA: Diagnosis not present

## 2023-04-16 DIAGNOSIS — G893 Neoplasm related pain (acute) (chronic): Secondary | ICD-10-CM | POA: Diagnosis not present

## 2023-04-16 DIAGNOSIS — K7030 Alcoholic cirrhosis of liver without ascites: Secondary | ICD-10-CM | POA: Diagnosis not present

## 2023-04-16 DIAGNOSIS — F1011 Alcohol abuse, in remission: Secondary | ICD-10-CM | POA: Diagnosis present

## 2023-04-16 DIAGNOSIS — Z1152 Encounter for screening for COVID-19: Secondary | ICD-10-CM

## 2023-04-16 DIAGNOSIS — I2510 Atherosclerotic heart disease of native coronary artery without angina pectoris: Secondary | ICD-10-CM | POA: Diagnosis not present

## 2023-04-16 DIAGNOSIS — C329 Malignant neoplasm of larynx, unspecified: Secondary | ICD-10-CM | POA: Diagnosis present

## 2023-04-16 DIAGNOSIS — Z79890 Hormone replacement therapy (postmenopausal): Secondary | ICD-10-CM

## 2023-04-16 DIAGNOSIS — E785 Hyperlipidemia, unspecified: Secondary | ICD-10-CM | POA: Diagnosis present

## 2023-04-16 DIAGNOSIS — I951 Orthostatic hypotension: Secondary | ICD-10-CM | POA: Diagnosis present

## 2023-04-16 DIAGNOSIS — R339 Retention of urine, unspecified: Secondary | ICD-10-CM | POA: Diagnosis present

## 2023-04-16 DIAGNOSIS — I5042 Chronic combined systolic (congestive) and diastolic (congestive) heart failure: Secondary | ICD-10-CM | POA: Diagnosis present

## 2023-04-16 DIAGNOSIS — Z888 Allergy status to other drugs, medicaments and biological substances status: Secondary | ICD-10-CM

## 2023-04-16 DIAGNOSIS — Z885 Allergy status to narcotic agent status: Secondary | ICD-10-CM

## 2023-04-16 DIAGNOSIS — I130 Hypertensive heart and chronic kidney disease with heart failure and stage 1 through stage 4 chronic kidney disease, or unspecified chronic kidney disease: Secondary | ICD-10-CM | POA: Diagnosis present

## 2023-04-16 DIAGNOSIS — I5022 Chronic systolic (congestive) heart failure: Secondary | ICD-10-CM | POA: Diagnosis present

## 2023-04-16 DIAGNOSIS — G40909 Epilepsy, unspecified, not intractable, without status epilepticus: Secondary | ICD-10-CM | POA: Diagnosis not present

## 2023-04-16 DIAGNOSIS — R64 Cachexia: Secondary | ICD-10-CM | POA: Diagnosis not present

## 2023-04-16 DIAGNOSIS — I6523 Occlusion and stenosis of bilateral carotid arteries: Secondary | ICD-10-CM | POA: Diagnosis present

## 2023-04-16 DIAGNOSIS — Z79899 Other long term (current) drug therapy: Secondary | ICD-10-CM

## 2023-04-16 DIAGNOSIS — E441 Mild protein-calorie malnutrition: Secondary | ICD-10-CM | POA: Diagnosis present

## 2023-04-16 DIAGNOSIS — Z515 Encounter for palliative care: Secondary | ICD-10-CM | POA: Diagnosis not present

## 2023-04-16 DIAGNOSIS — T84196A Other mechanical complication of internal fixation device of bone of right lower leg, initial encounter: Secondary | ICD-10-CM | POA: Diagnosis not present

## 2023-04-16 DIAGNOSIS — R630 Anorexia: Secondary | ICD-10-CM | POA: Diagnosis not present

## 2023-04-16 DIAGNOSIS — Z800 Family history of malignant neoplasm of digestive organs: Secondary | ICD-10-CM

## 2023-04-16 DIAGNOSIS — Z8249 Family history of ischemic heart disease and other diseases of the circulatory system: Secondary | ICD-10-CM

## 2023-04-16 DIAGNOSIS — R638 Other symptoms and signs concerning food and fluid intake: Secondary | ICD-10-CM

## 2023-04-16 DIAGNOSIS — R5383 Other fatigue: Secondary | ICD-10-CM

## 2023-04-16 DIAGNOSIS — Z83438 Family history of other disorder of lipoprotein metabolism and other lipidemia: Secondary | ICD-10-CM

## 2023-04-16 DIAGNOSIS — E039 Hypothyroidism, unspecified: Secondary | ICD-10-CM | POA: Diagnosis not present

## 2023-04-16 DIAGNOSIS — I9589 Other hypotension: Secondary | ICD-10-CM

## 2023-04-16 DIAGNOSIS — W19XXXA Unspecified fall, initial encounter: Secondary | ICD-10-CM

## 2023-04-16 DIAGNOSIS — Z930 Tracheostomy status: Secondary | ICD-10-CM

## 2023-04-16 DIAGNOSIS — R634 Abnormal weight loss: Secondary | ICD-10-CM

## 2023-04-16 DIAGNOSIS — E43 Unspecified severe protein-calorie malnutrition: Secondary | ICD-10-CM | POA: Diagnosis present

## 2023-04-16 DIAGNOSIS — Z7982 Long term (current) use of aspirin: Secondary | ICD-10-CM | POA: Diagnosis not present

## 2023-04-16 DIAGNOSIS — Z886 Allergy status to analgesic agent status: Secondary | ICD-10-CM

## 2023-04-16 DIAGNOSIS — Z87891 Personal history of nicotine dependence: Secondary | ICD-10-CM

## 2023-04-16 DIAGNOSIS — N183 Chronic kidney disease, stage 3 unspecified: Secondary | ICD-10-CM | POA: Diagnosis present

## 2023-04-16 DIAGNOSIS — Z801 Family history of malignant neoplasm of trachea, bronchus and lung: Secondary | ICD-10-CM

## 2023-04-16 DIAGNOSIS — Z8719 Personal history of other diseases of the digestive system: Secondary | ICD-10-CM

## 2023-04-16 DIAGNOSIS — I10 Essential (primary) hypertension: Secondary | ICD-10-CM | POA: Diagnosis present

## 2023-04-16 DIAGNOSIS — E162 Hypoglycemia, unspecified: Secondary | ICD-10-CM | POA: Diagnosis present

## 2023-04-16 LAB — CBC WITH DIFFERENTIAL/PLATELET
Abs Immature Granulocytes: 0.03 10*3/uL (ref 0.00–0.07)
Basophils Absolute: 0 10*3/uL (ref 0.0–0.1)
Basophils Relative: 0 %
Eosinophils Absolute: 0.1 10*3/uL (ref 0.0–0.5)
Eosinophils Relative: 2 %
HCT: 37.3 % — ABNORMAL LOW (ref 39.0–52.0)
Hemoglobin: 11.4 g/dL — ABNORMAL LOW (ref 13.0–17.0)
Immature Granulocytes: 1 %
Lymphocytes Relative: 31 %
Lymphs Abs: 1.5 10*3/uL (ref 0.7–4.0)
MCH: 25.7 pg — ABNORMAL LOW (ref 26.0–34.0)
MCHC: 30.6 g/dL (ref 30.0–36.0)
MCV: 84 fL (ref 80.0–100.0)
Monocytes Absolute: 0.4 10*3/uL (ref 0.1–1.0)
Monocytes Relative: 9 %
Neutro Abs: 2.7 10*3/uL (ref 1.7–7.7)
Neutrophils Relative %: 57 %
Platelets: 258 10*3/uL (ref 150–400)
RBC: 4.44 MIL/uL (ref 4.22–5.81)
RDW: 14.6 % (ref 11.5–15.5)
WBC: 4.8 10*3/uL (ref 4.0–10.5)
nRBC: 0 % (ref 0.0–0.2)

## 2023-04-16 LAB — URINALYSIS, ROUTINE W REFLEX MICROSCOPIC
Bacteria, UA: NONE SEEN
Bilirubin Urine: NEGATIVE
Glucose, UA: NEGATIVE mg/dL
Ketones, ur: NEGATIVE mg/dL
Nitrite: NEGATIVE
Protein, ur: 30 mg/dL — AB
Specific Gravity, Urine: 1.015 (ref 1.005–1.030)
WBC, UA: >50 WBC/hpf (ref 0–5)
pH: 6 (ref 5.0–8.0)

## 2023-04-16 LAB — COMPREHENSIVE METABOLIC PANEL
ALT: 6 U/L (ref 0–44)
AST: 17 U/L (ref 15–41)
Albumin: 3.1 g/dL — ABNORMAL LOW (ref 3.5–5.0)
Alkaline Phosphatase: 79 U/L (ref 38–126)
Anion gap: 11 (ref 5–15)
BUN: 24 mg/dL — ABNORMAL HIGH (ref 8–23)
CO2: 25 mmol/L (ref 22–32)
Calcium: 8.6 mg/dL — ABNORMAL LOW (ref 8.9–10.3)
Chloride: 100 mmol/L (ref 98–111)
Creatinine, Ser: 1.28 mg/dL — ABNORMAL HIGH (ref 0.61–1.24)
GFR, Estimated: >60 mL/min (ref >60)
Glucose, Bld: 93 mg/dL (ref 70–99)
Potassium: 4 mmol/L (ref 3.5–5.1)
Sodium: 136 mmol/L (ref 135–145)
Total Bilirubin: 0.8 mg/dL (ref 0.0–1.2)
Total Protein: 8.8 g/dL — ABNORMAL HIGH (ref 6.5–8.1)

## 2023-04-16 LAB — MAGNESIUM: Magnesium: 2.1 mg/dL (ref 1.7–2.4)

## 2023-04-16 LAB — TROPONIN I (HIGH SENSITIVITY)
Troponin I (High Sensitivity): <2 ng/L (ref <18)
Troponin I (High Sensitivity): <2 ng/L (ref <18)

## 2023-04-16 LAB — GLUCOSE, CAPILLARY
Glucose-Capillary: 69 mg/dL — ABNORMAL LOW (ref 70–99)
Glucose-Capillary: 78 mg/dL (ref 70–99)
Glucose-Capillary: 92 mg/dL (ref 70–99)

## 2023-04-16 LAB — CBG MONITORING, ED
Glucose-Capillary: 63 mg/dL — ABNORMAL LOW (ref 70–99)
Glucose-Capillary: 68 mg/dL — ABNORMAL LOW (ref 70–99)

## 2023-04-16 LAB — RESP PANEL BY RT-PCR (RSV, FLU A&B, COVID)  RVPGX2
Influenza A by PCR: NEGATIVE
Influenza B by PCR: NEGATIVE
Resp Syncytial Virus by PCR: NEGATIVE
SARS Coronavirus 2 by RT PCR: NEGATIVE

## 2023-04-16 MED ORDER — DEXTROSE 50 % IV SOLN: 12.5000 g | INTRAVENOUS | Status: DC | PRN

## 2023-04-16 MED ORDER — LACTATED RINGERS IV BOLUS
1000.0000 mL | Freq: Once | INTRAVENOUS | Status: AC
  Administered 2023-04-16: 1000 mL via INTRAVENOUS

## 2023-04-16 MED ORDER — ACETAMINOPHEN 650 MG RE SUPP: 650.0000 mg | Freq: Four times a day (QID) | RECTAL | Status: DC | PRN

## 2023-04-16 MED ORDER — ONDANSETRON HCL 4 MG/2ML IJ SOLN: 4.0000 mg | Freq: Four times a day (QID) | INTRAMUSCULAR | Status: DC | PRN

## 2023-04-16 MED ORDER — FOLIC ACID 1 MG PO TABS
1.0000 mg | ORAL_TABLET | Freq: Every day | ORAL | Status: DC
  Administered 2023-04-16 – 2023-04-19 (×4): 1 mg via ORAL
  Filled 2023-04-16 (×4): qty 1

## 2023-04-16 MED ORDER — LEVOTHYROXINE SODIUM 75 MCG PO TABS
75.0000 ug | ORAL_TABLET | Freq: Every day | ORAL | Status: DC
  Administered 2023-04-17 – 2023-04-19 (×3): 75 ug via ORAL
  Filled 2023-04-16 (×3): qty 1

## 2023-04-16 MED ORDER — ACETAMINOPHEN 325 MG PO TABS
650.0000 mg | ORAL_TABLET | Freq: Four times a day (QID) | ORAL | Status: DC | PRN
  Administered 2023-04-19: 650 mg via ORAL
  Filled 2023-04-16 (×2): qty 2

## 2023-04-16 MED ORDER — DEXTROSE-SODIUM CHLORIDE 5-0.9 % IV SOLN: INTRAVENOUS | Status: DC

## 2023-04-16 MED ORDER — DEXTROSE 50 % IV SOLN: 12.5000 g | Freq: Once | INTRAVENOUS | Status: DC

## 2023-04-16 MED ORDER — TRAMADOL HCL 50 MG PO TABS
50.0000 mg | ORAL_TABLET | Freq: Four times a day (QID) | ORAL | Status: DC | PRN
  Administered 2023-04-16 – 2023-04-17 (×2): 50 mg via ORAL
  Filled 2023-04-16 (×2): qty 1

## 2023-04-16 MED ORDER — ASPIRIN 81 MG PO TBEC
81.0000 mg | DELAYED_RELEASE_TABLET | Freq: Every day | ORAL | Status: DC
  Administered 2023-04-16 – 2023-04-19 (×4): 81 mg via ORAL
  Filled 2023-04-16 (×4): qty 1

## 2023-04-16 MED ORDER — ROSUVASTATIN CALCIUM 5 MG PO TABS
5.0000 mg | ORAL_TABLET | Freq: Every day | ORAL | Status: DC
  Administered 2023-04-16 – 2023-04-18 (×3): 5 mg via ORAL
  Filled 2023-04-16 (×4): qty 1

## 2023-04-16 MED ORDER — VITAMIN B-12 1000 MCG PO TABS
1000.0000 ug | ORAL_TABLET | Freq: Every day | ORAL | Status: DC
  Administered 2023-04-16 – 2023-04-19 (×4): 1000 ug via ORAL
  Filled 2023-04-16 (×4): qty 1

## 2023-04-16 MED ORDER — SODIUM CHLORIDE 0.9 % IV SOLN
1.0000 g | INTRAVENOUS | Status: DC
  Administered 2023-04-16: 1 g via INTRAVENOUS
  Filled 2023-04-16: qty 10

## 2023-04-16 MED ORDER — MORPHINE SULFATE (PF) 2 MG/ML IV SOLN
2.0000 mg | Freq: Once | INTRAVENOUS | Status: AC
  Administered 2023-04-16: 2 mg via INTRAVENOUS
  Filled 2023-04-16: qty 1

## 2023-04-16 MED ORDER — ONDANSETRON HCL 4 MG/2ML IJ SOLN
4.0000 mg | Freq: Once | INTRAMUSCULAR | Status: AC
  Administered 2023-04-16: 4 mg via INTRAVENOUS
  Filled 2023-04-16: qty 2

## 2023-04-16 MED ORDER — HYDRALAZINE HCL 10 MG PO TABS
10.0000 mg | ORAL_TABLET | Freq: Four times a day (QID) | ORAL | Status: DC | PRN
  Administered 2023-04-16: 10 mg via ORAL
  Filled 2023-04-16: qty 1

## 2023-04-16 MED ORDER — LEVETIRACETAM 500 MG PO TABS
500.0000 mg | ORAL_TABLET | Freq: Two times a day (BID) | ORAL | Status: DC
  Administered 2023-04-16 – 2023-04-19 (×6): 500 mg via ORAL
  Filled 2023-04-16 (×6): qty 1

## 2023-04-16 MED ORDER — ONDANSETRON HCL 4 MG PO TABS: 4.0000 mg | ORAL_TABLET | Freq: Four times a day (QID) | ORAL | Status: DC | PRN

## 2023-04-16 MED ORDER — FINASTERIDE 5 MG PO TABS
5.0000 mg | ORAL_TABLET | Freq: Every day | ORAL | Status: DC
  Administered 2023-04-16 – 2023-04-19 (×4): 5 mg via ORAL
  Filled 2023-04-16 (×4): qty 1

## 2023-04-16 MED ORDER — GADOBUTROL 1 MMOL/ML IV SOLN
4.0000 mL | Freq: Once | INTRAVENOUS | Status: AC | PRN
  Administered 2023-04-16: 4 mL via INTRAVENOUS

## 2023-04-16 MED ORDER — MORPHINE SULFATE (PF) 4 MG/ML IV SOLN: 4.0000 mg | Freq: Once | INTRAVENOUS | Status: DC

## 2023-04-16 MED ORDER — HYDRALAZINE HCL 25 MG PO TABS
25.0000 mg | ORAL_TABLET | Freq: Four times a day (QID) | ORAL | Status: DC | PRN
  Administered 2023-04-16: 25 mg via ORAL
  Filled 2023-04-16: qty 1

## 2023-04-16 NOTE — ED Triage Notes (Signed)
 Pt sent by [facility] for Hypotension 85/70 at Appt for pain. Pt has throat CA in remission, wife states Pt has declined ambulatory status and frequent seizures and falls over last week. Pt taking Keppra .

## 2023-04-16 NOTE — ED Provider Notes (Signed)
[place] EMERGENCY DEPARTMENT AT [facility] Provider Note   CSN: [id number] Arrival date & time: 04/16/23  1000     History  Chief Complaint  Patient presents with   Hypotension   Seizures   Fall    Nathaniel Pain Croy Drumwright is a 73 y.o. male with history of tongue/laryngeal cancer s/p laryngectomy, h/o SAH, cirrhosis, HTN, hypothyroidism, CAD, HFrecEF, seizures, who presents with seizures, declining functional status, decreased PO intake, and labile BP. Was seen by palliative medicine this morning where he was found to be hypotensive and sent to the ED. Daughter at bedside provides additional history.   Lower blood pressures, decreased PO intake, decreased ability to ambulate, and daily seizures. Prior to approx one week ago, he was doing normal things for him including eating/drinking, coming to table to eat, walking around. Is able to stand and start walking but once he starts, he starts to tremble/shake and go to the floor. Had an unwitnessed fall on Saturday just standing up from the bed. He said he hit his head after that fall, but daughter was in the room immediately after the fall and he did not lose consciousness. Patient lives with his daughter. No recent fevers/chills, nausea/vomiting, SOB, chest or abdominal pain. Complains of a constant headache and pain on the left side of his face. Been complaining of left-sided headache for months. Went to palliative center today for pain management, currently takes nothing for pain.  Blood pressure - has h/o HTN but recently had orthostatic hypotension, was taking midodrine which was helping, dose was adjusted a couple of times, but endocrinologist felt he may have adrenal insufficiency and was started on fludrocortisone, but BP was starting to run high, so stopped that medicine. Has been dealing with fluctuating blood pressure from low to high.  Seizures - has had focal seizures. Over the last year has been more often throughout the  day. Daughter feels like the issues with the blood pressure triggers the seizures, especially when he stands up. Daughter notes that his foot will "stutter," his hands and arms clench up, and he goes down the ground, and he stares into the distance "unaware of anything around him." Happening several times per day recently esp when he gets up, so he has not been getting up as often. Notes left-sided weakness during these episodes as well.   Shaking his head yes or no, patient denies any shortness of breath, chest pain, nausea vomiting, abdominal pain.  Denies feeling weak on one side or another, denies any asymmetric numbness tingling.  Endorses left-sided headache and facial pain.  Past Medical History:  Diagnosis Date   Alcohol abuse    Arthritis    Avascular necrosis of hip, right (HCC) 08/09/2016   Carotid artery disease (HCC)    CTO LICA, 40-59% RICA 05/2022 [place]   CHF (congestive heart failure) (HCC)    EF 15-20%, normal coronaries 12/28/11; EF 50-55% 08/2022   Cirrhosis of liver (HCC)    CKD (chronic kidney disease)    Complication of anesthesia    SUCCINYLCHOLINE ADVERSE REACTION (not an allergy) Following 1st attempt to place tracheotomy tube in 2011 at [facility]; patient went into "succinylcholine induced pulseless electrical activity secondary to probable hyperkalemia" and underwent chest compressions and placement of endotracheal tube with admission to medical ICU.  EXTREMELY HIGH SEVERE REACTION.    Hyperlipidemia    Hypertension    Hypothyroidism    Laryngeal cancer (HCC) 2011   Laryngectomy, T3N0M0 and mouth cancer  Pneumonia    SAH (subarachnoid hemorrhage) (HCC) 07/20/2019   right frontal lobe [facility] 07/20/19   Seizures (HCC)    Urinary retention 05/2022       Home Medications Prior to Admission medications   Medication Sig Start Date End Date Taking? Authorizing Provider  aspirin EC 81 MG tablet Take 81 mg by mouth daily. Swallow whole.    [provider]   cyanocobalamin (VITAMIN B12) 500 MCG tablet Take 2 tablets (1,000 mcg total) by mouth daily. 04/18/22   Regalado, Belkys A, MD  diclofenac Sodium (VOLTAREN) 1 % GEL Apply 2 g topically 2 (two) times daily as needed (pain).    [provider]  finasteride (PROSCAR) 5 MG tablet Take 1 tablet (5 mg total) by mouth daily. 01/24/23 01/24/24  Jerre Simon, MD  fludrocortisone (FLORINEF) 0.1 MG tablet Take by mouth. 02/16/23   [provider]  folic acid (FOLVITE) 1 MG tablet Take 1 mg by mouth daily. 02/13/23   [provider]  guaiFENesin (MUCINEX) 600 MG 12 hr tablet Take 600 mg by mouth 2 (two) times daily as needed for cough or to loosen phlegm.    [provider]  icosapent Ethyl (VASCEPA) 1 g capsule TAKE 2 CAPSULES(2 GRAMS) BY MOUTH TWICE DAILY 06/27/22   Valetta Close, MD  levETIRAcetam (KEPPRA) 500 MG tablet Take 1 tablet (500 mg total) by mouth 2 (two) times daily. 11/14/22   Ihor Austin, NP  levothyroxine (SYNTHROID) 75 MCG tablet Take 75 mcg by mouth daily before breakfast. 09/22/22   [provider]  NEOMYCIN-POLYMYXIN-HYDROCORTISONE (CORTISPORIN) 1 % SOLN OTIC solution Place 4 drops into the left ear in the morning, at noon, and at bedtime. 01/26/23   [provider]  oxyCODONE (ROXICODONE) 5 MG/5ML solution Take 2.5-5 mLs (2.5-5 mg total) by mouth 4 (four) times daily as needed for severe pain. Take with food. 10/06/22   Lonie Peak, MD  rosuvastatin (CRESTOR) 5 MG tablet Take 5 mg by mouth at bedtime. 03/12/23   [provider]      Allergies    Succinylcholine, Tylenol [acetaminophen], Vicodin [hydrocodone-acetaminophen], Other, Tamsulosin, and Protonix [pantoprazole]    Review of Systems   Review of Systems A 10 point review of systems was performed and is negative unless otherwise reported in HPI.  Physical Exam Updated Vital Signs BP (!) 165/124   Pulse 98   Temp 97.7 F (36.5 C)   Resp 20   SpO2 96%   Physical Exam General: Cachectic elderly man, lying in bed HEENT: NCAT, no skull depressions/deformities/hematomas, PERRLA, EOMI sclera anicteric, dry mucous membranes, trachea midline, tongue protrudes midline.  No facial droop.  Tracheotomy. Cardiology: RRR, no murmurs/rubs/gallops. BL radial and DP pulses equal bilaterally.  Resp: Normal respiratory rate and effort. CTAB, no wheezes, rhonchi, crackles.  Abd: Soft, non-tender, non-distended. No rebound tenderness or guarding.  GU: Deferred. MSK: No peripheral edema or signs of trauma. Extremities without deformity or TTP.  Skin: warm, dry.  Back: No CVA tenderness Neuro: A&Ox4, CNs II-XII grossly intact. 5/5 strength all extremities. Sensation grossly intact. Aphonic due to laryngectomy but appropriately responsive.  Psych: Normal mood and affect.   ED Results / Procedures / Treatments   Labs (all labs ordered are listed, but only abnormal results are displayed) Labs Reviewed  RESP PANEL BY RT-PCR (RSV, FLU A&B, COVID)  RVPGX2  CBC WITH DIFFERENTIAL/PLATELET  COMPREHENSIVE METABOLIC PANEL  MAGNESIUM  URINALYSIS, ROUTINE W REFLEX MICROSCOPIC  LEVETIRACETAM LEVEL  CBG MONITORING, ED  TROPONIN I (HIGH SENSITIVITY)    EKG None  Radiology No results found.  Procedures Procedures    Medications Ordered in ED Medications  ondansetron (ZOFRAN) injection 4 mg (has no administration in time range)  morphine (PF) 2 MG/ML injection 2 mg (has no administration in time range)  lactated ringers bolus 1,000 mL (1,000 mLs Intravenous New Bag/Given 04/16/23 1050)    ED Course/ Medical Decision Making/ A&P                          Medical Decision Making Amount and/or Complexity of Data Reviewed Labs: ordered. Decision-making details documented in ED Course. Radiology: ordered. Decision-making details documented in ED Course.  Risk Prescription drug management. Decision regarding hospitalization.    This patient presents to  the ED for concern of several complaints, this involves an extensive number of treatment options, and is a complaint that carries with it a high risk of complications and morbidity.  I considered the following differential and admission for this acute, potentially life threatening condition. On arrival, patient is actually hypertensive into 180s/120s, not hypotensive. He is alert and cachectic but not acutely ill-appearing.   MDM:    Patient with several complaints. For increased seizure activity, decreased functional status, consider possible brain mets or space occupying lesion of the brain d/t h/o cancer. Will likely need MRI brain today. Daughter does report that it seems he is weaker on his left side during "episodes," consider CVA as well or todd's paralysis. No report of noncompliance w/ keppra, will get keppra level. Seems as though his seizures occur when standing up, possible that they are convulsive syncope d/t orthostasis w/ labile BP. Was sent to ED for hypotension but on arrival is hypertensive in 180s/120s. He has no FNDs or chest pain to indicate hypertensive emergency. Did have fall w/ head trauma reported, will get [facility] to r/o ICH. Also consider hydrocephalus, electrolyte derangements or renal injury d/t decreased PO intake. For weakness considered ACS but EKG w/o signs of ischemia, trop neg, and no CP. No LEE or crackles to indicate acute HF. No anemia. He appears dry and w/ decreased oral intake will give LR (has HFreEF w/ EF 55%).   Clinical Course as of 04/21/23 1015  Mon Apr 16, 2023  1202 Glucose-Capillary(!): 63 Mildly hypoglycemic [HN]  1202 Troponin I (High Sensitivity): <2 neg [HN]  1202 Resp panel by RT-PCR (RSV, Flu A&B, Covid) Anterior Nasal Swab neg [HN]  1203 WBC: 4.8 No leukocytosis  [HN]  1227 CT Head Wo Contrast 1. No evidence of acute intracranial abnormality. 2. Mild chronic small vessel ischemic disease. 3. Unchanged planum sphenoidale meningioma.   [HN]   1251 Glucose-Capillary(!): 63 Given OJ [HN]  1408 MR Brain W and Wo Contrast 1. No acute intracranial abnormality. 2. Mild chronic small vessel ischemic disease. 3. 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   [HN]  1408 Orthostatics significantly positive [HN]  1408 Will admit to medicine [HN]    Clinical Course User Index [HN] Loetta Rough, MD     Labs: I Ordered, and personally interpreted labs.  The pertinent results include:  those listed above  Imaging Studies ordered: I ordered imaging studies including CTH I independently visualized and interpreted imaging. I agree with the radiologist interpretation  Additional history obtained from chart review, daughter at bedside.    Cardiac Monitoring: The patient was maintained on a cardiac monitor.  I personally viewed and interpreted the  cardiac monitored which showed an underlying rhythm of: NSR  Reevaluation: After the interventions noted above, I reevaluated the patient and found that they have :improved  Social Determinants of Health: Lives with daughter  Disposition:  Patient with orthostatic hypotension. Admit to medicine  Co morbidities that complicate the patient evaluation  Past Medical History:  Diagnosis Date   Alcohol abuse    Arthritis    Avascular necrosis of hip, right (HCC) 08/09/2016   Carotid artery disease (HCC)    CTO LICA, 40-59% RICA 05/2022 [place]   CHF (congestive heart failure) (HCC)    EF 15-20%, normal coronaries 12/28/11; EF 50-55% 08/2022   Cirrhosis of liver (HCC)    CKD (chronic kidney disease)    Complication of anesthesia    SUCCINYLCHOLINE ADVERSE REACTION (not an allergy) Following 1st attempt to place tracheotomy tube in 2011 at [facility]; patient went into "succinylcholine induced pulseless electrical activity secondary to probable hyperkalemia" and underwent chest compressions and placement of endotracheal tube with admission to medical ICU.  EXTREMELY  HIGH SEVERE REACTION.    Hyperlipidemia    Hypertension    Hypothyroidism    Laryngeal cancer (HCC) 2011   Laryngectomy, T3N0M0 and mouth cancer   Pneumonia    SAH (subarachnoid hemorrhage) (HCC) 07/20/2019   right frontal lobe [facility] 07/20/19   Seizures (HCC)    Urinary retention 05/2022     Medicines Meds ordered this encounter  Medications   lactated ringers bolus 1,000 mL   DISCONTD: morphine (PF) 4 MG/ML injection 4 mg   ondansetron (ZOFRAN) injection 4 mg   morphine (PF) 2 MG/ML injection 2 mg    I have reviewed the patients home medicines and have made adjustments as needed  Problem List / ED Course: Problem List Items Addressed This Visit       Cardiovascular and Mediastinum   Orthostatic hypotension - Primary     Other   * (Principal) Seizure-like activity (HCC)   Other Visit Diagnoses       Fall, initial encounter         Decreased oral intake                       This note was created using dictation software, which may contain spelling or grammatical errors.    Loetta Rough, MD 04/21/23 1019

## 2023-04-16 NOTE — Progress Notes (Signed)
 Patient was seen for exposed surgical hardware.   He was admitted today for orthostatic hypotension, appears cachectic, and has exposed screw head at the right lateral malleolus.   Patient states that the surgery was performed ~15 yrs ago and he first saw the screw head exposed ~2 wks ago.   I appreciate Dr. Charol Copas of orthopedic surgery reviewing the situation with me. He will have the patient evaluated by orthopedic surgery in the morning.

## 2023-04-16 NOTE — Patient Instructions (Signed)
 VISIT SUMMARY:  Today, we discussed your ongoing issues with pain, fluctuating blood pressure, seizures, and weight loss. We reviewed your current medications and considered alternative options for pain management. We also talked about the importance of follow-up appointments with your primary care doctor, endocrinologist, and other specialists to better manage your conditions.  YOUR PLAN:  -HYPOTENSION: Hypotension means low blood pressure, which can cause weakness and dizziness. We recommend following up with your primary care doctor or an endocrinologist to reassess your blood pressure management. Hospital ED evaluation may be considered for further evaluation and management of your persistent low blood pressure.  -PAIN: You are experiencing chronic pain in your head, jaw, and ear. Due to your liver condition and blood pressure issues, pain management is challenging. We may consider using Tramadol  or Flexeril for pain relief if your blood pressure stabilizes.  -SEIZURES: Seizures are sudden, uncontrolled electrical disturbances in the brain. Your daily seizures may be related to your fluctuating blood pressure. We will continue with your current seizure management and monitor your condition closely.  -MALNUTRITION: Malnutrition means your body is not getting enough nutrients, leading to significant weight loss and poor appetite. We may consider hospital admission for nutritional support and possible inpatient rehabilitation to help improve your nutritional status.  -CIRRHOSIS: Cirrhosis is severe liver damage that affects your liver's ability to function. You should continue to avoid medications that can harm your liver.  -CANCER HISTORY: You have a history of throat, soft palate, and mouth cancer, which are currently in remission. It is important to continue regular follow-ups with your oncology team to monitor your condition.  -EAR INFECTION: You had an ear infection and a small pinhole in  your left ear. Continue with your current management and follow up with your ENT specialist as scheduled.  INSTRUCTIONS:  Please schedule follow-up appointments with your primary care doctor and endocrinologist to reassess your blood pressure management. Consider hospital evaluation for further evaluation of your persistent low blood pressure and nutritional support. Continue regular follow-ups with your oncology team and ENT specialist.  Please call office to schedule follow-up visit in one week. [phone].
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
## 2023-04-17 ENCOUNTER — Inpatient Hospital Stay (HOSPITAL_COMMUNITY): Payer: Medicare Other

## 2023-04-17 ENCOUNTER — Encounter (HOSPITAL_COMMUNITY): Payer: Self-pay | Admitting: Internal Medicine

## 2023-04-17 ENCOUNTER — Observation Stay (HOSPITAL_COMMUNITY): Payer: Medicare Other

## 2023-04-17 DIAGNOSIS — Z79890 Hormone replacement therapy (postmenopausal): Secondary | ICD-10-CM | POA: Diagnosis not present

## 2023-04-17 DIAGNOSIS — Z7982 Long term (current) use of aspirin: Secondary | ICD-10-CM | POA: Diagnosis not present

## 2023-04-17 DIAGNOSIS — K7030 Alcoholic cirrhosis of liver without ascites: Secondary | ICD-10-CM | POA: Diagnosis present

## 2023-04-17 DIAGNOSIS — R569 Unspecified convulsions: Secondary | ICD-10-CM | POA: Diagnosis not present

## 2023-04-17 DIAGNOSIS — Z1152 Encounter for screening for COVID-19: Secondary | ICD-10-CM | POA: Diagnosis not present

## 2023-04-17 DIAGNOSIS — Z79899 Other long term (current) drug therapy: Secondary | ICD-10-CM | POA: Diagnosis not present

## 2023-04-17 DIAGNOSIS — R64 Cachexia: Secondary | ICD-10-CM | POA: Diagnosis present

## 2023-04-17 DIAGNOSIS — I951 Orthostatic hypotension: Secondary | ICD-10-CM | POA: Diagnosis present

## 2023-04-17 DIAGNOSIS — C329 Malignant neoplasm of larynx, unspecified: Secondary | ICD-10-CM | POA: Diagnosis present

## 2023-04-17 DIAGNOSIS — I130 Hypertensive heart and chronic kidney disease with heart failure and stage 1 through stage 4 chronic kidney disease, or unspecified chronic kidney disease: Secondary | ICD-10-CM | POA: Diagnosis present

## 2023-04-17 DIAGNOSIS — R627 Adult failure to thrive: Secondary | ICD-10-CM | POA: Diagnosis present

## 2023-04-17 DIAGNOSIS — T84196A Other mechanical complication of internal fixation device of bone of right lower leg, initial encounter: Secondary | ICD-10-CM | POA: Diagnosis present

## 2023-04-17 DIAGNOSIS — E785 Hyperlipidemia, unspecified: Secondary | ICD-10-CM | POA: Diagnosis present

## 2023-04-17 DIAGNOSIS — N1831 Chronic kidney disease, stage 3a: Secondary | ICD-10-CM | POA: Diagnosis present

## 2023-04-17 DIAGNOSIS — I2510 Atherosclerotic heart disease of native coronary artery without angina pectoris: Secondary | ICD-10-CM | POA: Diagnosis present

## 2023-04-17 DIAGNOSIS — T8484XA Pain due to internal orthopedic prosthetic devices, implants and grafts, initial encounter: Secondary | ICD-10-CM | POA: Diagnosis not present

## 2023-04-17 DIAGNOSIS — Z930 Tracheostomy status: Secondary | ICD-10-CM | POA: Diagnosis not present

## 2023-04-17 DIAGNOSIS — Y798 Miscellaneous orthopedic devices associated with adverse incidents, not elsewhere classified: Secondary | ICD-10-CM | POA: Diagnosis present

## 2023-04-17 DIAGNOSIS — E8809 Other disorders of plasma-protein metabolism, not elsewhere classified: Secondary | ICD-10-CM | POA: Diagnosis present

## 2023-04-17 DIAGNOSIS — I6523 Occlusion and stenosis of bilateral carotid arteries: Secondary | ICD-10-CM | POA: Diagnosis present

## 2023-04-17 DIAGNOSIS — Z681 Body mass index (BMI) 19 or less, adult: Secondary | ICD-10-CM | POA: Diagnosis not present

## 2023-04-17 DIAGNOSIS — I5042 Chronic combined systolic (congestive) and diastolic (congestive) heart failure: Secondary | ICD-10-CM | POA: Diagnosis present

## 2023-04-17 DIAGNOSIS — E039 Hypothyroidism, unspecified: Secondary | ICD-10-CM | POA: Diagnosis present

## 2023-04-17 DIAGNOSIS — R55 Syncope and collapse: Secondary | ICD-10-CM | POA: Diagnosis not present

## 2023-04-17 DIAGNOSIS — G40909 Epilepsy, unspecified, not intractable, without status epilepticus: Secondary | ICD-10-CM | POA: Diagnosis present

## 2023-04-17 DIAGNOSIS — E43 Unspecified severe protein-calorie malnutrition: Secondary | ICD-10-CM | POA: Diagnosis present

## 2023-04-17 DIAGNOSIS — F1011 Alcohol abuse, in remission: Secondary | ICD-10-CM | POA: Diagnosis present

## 2023-04-17 DIAGNOSIS — C01 Malignant neoplasm of base of tongue: Secondary | ICD-10-CM | POA: Diagnosis present

## 2023-04-17 LAB — GLUCOSE, CAPILLARY
Glucose-Capillary: 100 mg/dL — ABNORMAL HIGH (ref 70–99)
Glucose-Capillary: 64 mg/dL — ABNORMAL LOW (ref 70–99)
Glucose-Capillary: 77 mg/dL (ref 70–99)
Glucose-Capillary: 88 mg/dL (ref 70–99)
Glucose-Capillary: 88 mg/dL (ref 70–99)
Glucose-Capillary: 90 mg/dL (ref 70–99)
Glucose-Capillary: 92 mg/dL (ref 70–99)

## 2023-04-17 LAB — HIV ANTIBODY (ROUTINE TESTING W REFLEX): HIV Screen 4th Generation wRfx: NONREACTIVE

## 2023-04-17 LAB — COMPREHENSIVE METABOLIC PANEL
ALT: 6 U/L (ref 0–44)
AST: 12 U/L — ABNORMAL LOW (ref 15–41)
Albumin: 2.4 g/dL — ABNORMAL LOW (ref 3.5–5.0)
Alkaline Phosphatase: 64 U/L (ref 38–126)
Anion gap: 12 (ref 5–15)
BUN: 14 mg/dL (ref 8–23)
CO2: 26 mmol/L (ref 22–32)
Calcium: 8.6 mg/dL — ABNORMAL LOW (ref 8.9–10.3)
Chloride: 97 mmol/L — ABNORMAL LOW (ref 98–111)
Creatinine, Ser: 1.04 mg/dL (ref 0.61–1.24)
GFR, Estimated: >60 mL/min (ref >60)
Glucose, Bld: 89 mg/dL (ref 70–99)
Potassium: 4.1 mmol/L (ref 3.5–5.1)
Sodium: 135 mmol/L (ref 135–145)
Total Bilirubin: 0.8 mg/dL (ref 0.0–1.2)
Total Protein: 6.9 g/dL (ref 6.5–8.1)

## 2023-04-17 LAB — CBC
HCT: 31.6 % — ABNORMAL LOW (ref 39.0–52.0)
Hemoglobin: 10 g/dL — ABNORMAL LOW (ref 13.0–17.0)
MCH: 26.2 pg (ref 26.0–34.0)
MCHC: 31.6 g/dL (ref 30.0–36.0)
MCV: 82.9 fL (ref 80.0–100.0)
Platelets: 211 10*3/uL (ref 150–400)
RBC: 3.81 MIL/uL — ABNORMAL LOW (ref 4.22–5.81)
RDW: 14.6 % (ref 11.5–15.5)
WBC: 4.3 10*3/uL (ref 4.0–10.5)
nRBC: 0 % (ref 0.0–0.2)

## 2023-04-17 LAB — URINE CULTURE: Culture: >=100000 — AB

## 2023-04-17 LAB — LEVETIRACETAM LEVEL: Levetiracetam Lvl: 22.7 ug/mL (ref 10.0–40.0)

## 2023-04-17 MED ORDER — DEXTROSE-SODIUM CHLORIDE 5-0.9 % IV SOLN: INTRAVENOUS | Status: AC

## 2023-04-17 MED ORDER — OXYCODONE HCL 5 MG PO TABS
5.0000 mg | ORAL_TABLET | ORAL | Status: DC | PRN
  Administered 2023-04-17 – 2023-04-18 (×5): 5 mg via ORAL
  Filled 2023-04-17 (×5): qty 1
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
## 2023-04-17 NOTE — Progress Notes (Signed)
Progress Note   Patient: [name] [id number]:[id number] DOB: April 14, 1950 DOA: 04/16/2023     0 DOS: the patient was seen and examined on 04/17/2023   Brief hospital course: 72yo with h/o seizure d/o, HLD, HTN, hypothyroidism, ETOH use d/o with cirrhosis, chronic systolic CHF, and laryngeal cancer s/p laryngectomy who presented on 2/10 from the cancer center with hypotension, seizures, and a fall.  Seizure-like activity could be autonomic dysfunction vs. Convulsive syncope, and neurology requested transfer to [facility].  He was also noted to have an exposed screw head at the R lateral malleolus and orthopedics will consult.  Assessment and Plan:  Seizure-like activity Possibly autonomic dysfunction versus convulsive syncope Neuro requested to transfer to [facility] Patient placed in observation in progressive care Frequent neurochecks Continuous video EEG ordered Consult PT and OT Continue Keppra 500 mg p.o. twice daily Check carotid dopplers and echocardiogram Neurology was consulted  Carotid stenosis 11/2022 carotid US with 60-79% R ICA stenosis and total occlusion of L ICA He is scheduled for repeat carotid US on 3/13 Will do now given presenting complaint Continue ASA Continue rosuvastatin - has only been on this for maybe a month or so Will check lipid panel in the AM  Exposed hardware Prior remote ankle surgery, now with hardware failure Not bothersome to patient currently and without signs of infection Orthopedics consulted Recommend keeping area covered with dry dressing F/u with [facility] post-discharge   CKD (chronic kidney disease), stage IIIa Appears to be stable at this time Attempt to avoid nephrotoxic medications Recheck BMP in AM  Also with urinary retention, continue finasteride No evidence of UTI on UA, given Ceftriaxone but will stop this; urine culture is pending   Tracheostomy status  Patient with h/o cancer of base of tongue and larynx Status  post radiotherapy Follow-up with oncology   Hypoglycemia  Resolved Stop D5 infusion Check q4h CBG x 4 and then stop if normal   Hypoalbuminemia In the setting of anemia, cirrhosis and malignancy Nutrition consulted   Essential hypertension, benign Currently not on antihypertensives Initially hypotensive but this has resolved Has apparent h/o orthostasis, was on fludrocortisone but he is no longer taking this medication Will monitor for now without intervention   Hypothyroidism TSH normal 6 months ago Continue Synthroid   Chronic systolic heart failure Appears to be compensated Check echocardiogram   Hyperlipidemia Continue rosuvastatin    History of alcoholic cirrhosis No signs of decompensation Reports remission from ETOH use d/o       Consultants: Neurology Orthopedics PT OT Nutrition  Procedures: vLTM 2/11 Carotid US 2/11 Echocardiogram 2/11  Antibiotics: Ceftriaxone x 1 dose      Subjective: reports left-sided headache.  Otherwise no new complaints.  Uses lip reading for verbalization, otherwise mute.  His daughter reports that he went to the cancer center for pain management and had hypotension.  He has had this problem recently.  Has tried 2 different medications and those were stopped because they caused significant HTN.  BP is really low in late mornings and early evenings, high in afternoons and midday.  He has not passed out recently, did have a fall on Saturday after standing.  No injury.   Symptoms (seizure-like activity) with position changes.  He has not been able to get out of bed and do any activities recently due to these issues.  He is a full code.   Objective: Vitals:   04/17/23 0735 04/17/23 1200  BP: (!) 133/96 (!) 145/98  Pulse: 81  84  Resp: 18 18  Temp: 98.1 F (36.7 C) 98.2 F (36.8 C)  SpO2: 100% 100%    Intake/Output Summary (Last 24 hours) at 04/17/2023 1243 Last data filed at 04/17/2023 0000 Gross per 24 hour  Intake  383.98 ml  Output --  Net 383.98 ml   Filed Weights   04/16/23 1842  Weight: 43.4 kg    Exam:  General:  Appears calm and comfortable and is in NAD Eyes:  EOMI, normal lids, iris ENT:  grossly normal hearing, lips & tongue, mmm Neck:  trach stoma appreciated, no external voice box present Cardiovascular:  RRR, no m/r/g. No LE edema.  Respiratory:   CTA bilaterally with no wheezes/rales/rhonchi.  Normal respiratory effort. Abdomen:  soft, NT, ND Skin:  no rash or induration seen on limited exam Musculoskeletal:  grossly normal tone BUE/BLE, good ROM, no bony abnormality Psychiatric:  blunted mood and affect, speech mute - attempts to communicate with mouth movements Neurologic:  CN 2-12 grossly intact, moves all extremities in coordinated fashion  Data Reviewed: I have reviewed the patient's lab results since admission.  Pertinent labs for today include:   Albumin 2.4 Otherwise unremarkable CMP WBC 4.3 Hgb 10     Family Communication: None present; I spoke with his daughter by telephone  Disposition: Status is: Observation The patient remains OBS appropriate and will d/c before 2 midnights.     Time spent: 50 minutes  Unresulted Labs (From admission, onward)     Start     Ordered   04/18/23 0500  CBC with Differential/Platelet  Tomorrow morning,   R        04/17/23 1227   04/18/23 0500  Basic metabolic panel  Tomorrow morning,   R        04/17/23 1227   04/18/23 0500  Lipid panel  Tomorrow morning,   R        04/17/23 1233   04/16/23 1732  Urine Culture (for pregnant, neutropenic or urologic patients or patients with an indwelling urinary catheter)  (Urine Labs)  ONCE - URGENT,   URGENT       Question Answer Comment  Indication Sepsis   Patient immune status Immunocompromised      04/16/23 1731   04/16/23 1014  Levetiracetam level  Once,   URGENT        04/16/23 1013             Author: Jonah Blue, MD 04/17/2023 12:43 PM  For on call review  [email or web address].

## 2023-04-17 NOTE — TOC Initial Note (Signed)
Transition of Care [facility]) - Initial/Assessment Note    Patient Details  Name: [name] MRN: [id number] Date of Birth: September 02, 1950  Transition of Care [facility]) CM/SW Contact:    Kermit Balo, RN Phone Number: 04/17/2023, 11:36 AM  Clinical Narrative:                  Pt is from home with his daughter who he says is here all the time. Pt is mute but able to mouth words.  Daughter provides needed transportation and manages his medications. Pt on LTEEG. TOC following.  Expected Discharge Plan: Home/Self Care Barriers to Discharge: Continued Medical Work up   Patient Goals and CMS Choice            Expected Discharge Plan and Services       Living arrangements for the past 2 months: Single Family Home                                      Prior Living Arrangements/Services Living arrangements for the past 2 months: Single Family Home Lives with:: Adult Children Patient language and need for interpreter reviewed:: Yes Do you feel safe going back to the place where you live?: Yes        Care giver support system in place?: Yes (comment) Current home services: DME (walker/ shower seat) Criminal Activity/Legal Involvement Pertinent to Current Situation/Hospitalization: No - Comment as needed  Activities of Daily Living      Permission Sought/Granted                  Emotional Assessment Appearance:: Appears stated age Attitude/Demeanor/Rapport: Engaged Affect (typically observed): Accepting Orientation: : Oriented to Self, Oriented to Place, Oriented to  Time, Oriented to Situation   Psych Involvement: No (comment)  Admission diagnosis:  Orthostatic hypotension [I95.1] Decreased oral intake [R63.8] Seizure-like activity (HCC) [R56.9] Fall, initial encounter [W19.XXXA] Patient Active Problem List   Diagnosis Date Noted   Mild protein malnutrition (HCC) 04/16/2023   Hypoglycemia 04/16/2023   Malignant neoplasm of base of tongue (HCC)  10/06/2022   Tracheostomy status (HCC) 06/26/2022   Alcoholic encephalopathy (HCC) 78/29/5621   Urinary retention 05/26/2022   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 of localized onset, not intractable, without status epilepticus (HCC) 05/09/2022   Avascular necrosis of femoral head, left (HCC) 04/28/2022   Sepsis (HCC) 04/14/2022   Left ear pain 12/27/2021   Neck pain 12/27/2021   Moderate protein-calorie malnutrition (HCC) 11/20/2019   SAH (subarachnoid hemorrhage) (HCC) 11/20/2019   Tracheostomy care (HCC) 10/23/2019   Gastrostomy tube dysfunction (HCC) 10/23/2019   Frequent falls    Alcohol abuse    Seizure (HCC) 07/20/2019   Alcohol abuse with intoxication (HCC) 07/20/2019   History of cirrhosis of liver 07/20/2019   CKD (chronic kidney disease), stage III (HCC) 07/20/2019   Seizure-like activity (HCC) 07/13/2019   Right sided weakness 07/03/2019   Status post total replacement of right hip 09/15/2016   Cirrhosis (HCC) 02/03/2015   Neutropenic fever (HCC) 07/15/2014   Failure to thrive in adult 04/16/2014   Throat cancer (HCC)    Esophageal mass    Odynophagia    Protein-calorie malnutrition, severe (HCC) 04/10/2014   Pancreatitis, chronic (HCC) 05/20/2013   Primary adrenal insufficiency (HCC) 05/20/2013   Hyperlipidemia    Osteoarthritis    Orthostatic hypotension 05/10/2013  Chronic systolic heart failure (HCC) 01/04/2012   Alcohol dependence (HCC) 01/04/2012   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, laryngeal aspect (HCC) 11/23/2011   Ascites 12/16/2010   Pneumonia 12/16/2010   Hypothyroidism 10/16/2010   Malignant neoplasm of larynx (HCC) 01/10/2010   PULMONARY NODULE 12/31/2007   Essential hypertension, benign 11/08/2007   PCP:  Julien Girt, PA-C Pharmacy:   [facility] [phone] [place], [place] - [address] AT [facility] [address] & [facility]  [address] [place] [address] Phone: [phone] Fax: [phone]     Social Drivers of Health (SDOH) Social History: SDOH Screenings   Food Insecurity: No Food Insecurity (04/17/2023)  Housing: Unknown (04/17/2023)  Transportation Needs: No Transportation Needs (04/17/2023)  Utilities: Not At Risk (04/17/2023)  Depression (PHQ2-9): Medium Risk (10/09/2022)  Financial Resource Strain: Low Risk  (03/08/2023)   Received from [facility]  Physical Activity: Unknown (03/08/2023)   Received from [facility]  Social Connections: Socially Isolated (04/17/2023)  Stress: No Stress Concern Present (03/08/2023)   Received from [facility]  Tobacco Use: Medium Risk (04/16/2023)   SDOH Interventions:     Readmission Risk Interventions     No data to display

## 2023-04-17 NOTE — Progress Notes (Signed)
vLTM setup  All impedances below 10kohms.  [facility] monitoring

## 2023-04-17 NOTE — Plan of Care (Signed)
Problem: Coping: Goal: Level of anxiety will decrease Outcome: Progressing

## 2023-04-17 NOTE — Hospital Course (Addendum)
72yo with h/o seizure d/o, HLD, HTN, hypothyroidism, ETOH use d/o with cirrhosis, chronic systolic CHF, and laryngeal cancer s/p laryngectomy who presented on 2/10 from the cancer center with hypotension, seizures, and a fall.  Seizure-like activity could be autonomic dysfunction vs. Convulsive syncope, and neurology requested transfer to [facility].  He was also noted to have an exposed screw head at the R lateral malleolus and orthopedics will consult.

## 2023-04-18 ENCOUNTER — Inpatient Hospital Stay (HOSPITAL_COMMUNITY): Payer: Medicare Other

## 2023-04-18 DIAGNOSIS — R55 Syncope and collapse: Secondary | ICD-10-CM | POA: Diagnosis not present

## 2023-04-18 DIAGNOSIS — T8484XA Pain due to internal orthopedic prosthetic devices, implants and grafts, initial encounter: Secondary | ICD-10-CM | POA: Diagnosis not present

## 2023-04-18 DIAGNOSIS — I6523 Occlusion and stenosis of bilateral carotid arteries: Secondary | ICD-10-CM

## 2023-04-18 DIAGNOSIS — R569 Unspecified convulsions: Secondary | ICD-10-CM | POA: Diagnosis not present

## 2023-04-18 LAB — BASIC METABOLIC PANEL
Anion gap: 6 (ref 5–15)
BUN: 9 mg/dL (ref 8–23)
CO2: 27 mmol/L (ref 22–32)
Calcium: 8.1 mg/dL — ABNORMAL LOW (ref 8.9–10.3)
Chloride: 99 mmol/L (ref 98–111)
Creatinine, Ser: 1.09 mg/dL (ref 0.61–1.24)
GFR, Estimated: >60 mL/min (ref >60)
Glucose, Bld: 94 mg/dL (ref 70–99)
Potassium: 4.2 mmol/L (ref 3.5–5.1)
Sodium: 132 mmol/L — ABNORMAL LOW (ref 135–145)

## 2023-04-18 LAB — LIPID PANEL
Cholesterol: 99 mg/dL (ref 0–200)
HDL: 21 mg/dL — ABNORMAL LOW (ref >40)
LDL Cholesterol: 68 mg/dL (ref 0–99)
Total CHOL/HDL Ratio: 4.7 {ratio}
Triglycerides: 51 mg/dL (ref <150)
VLDL: 10 mg/dL (ref 0–40)

## 2023-04-18 LAB — CBC WITH DIFFERENTIAL/PLATELET
Abs Immature Granulocytes: 0.02 10*3/uL (ref 0.00–0.07)
Basophils Absolute: 0 10*3/uL (ref 0.0–0.1)
Basophils Relative: 0 %
Eosinophils Absolute: 0.2 10*3/uL (ref 0.0–0.5)
Eosinophils Relative: 3 %
HCT: 28.6 % — ABNORMAL LOW (ref 39.0–52.0)
Hemoglobin: 9 g/dL — ABNORMAL LOW (ref 13.0–17.0)
Immature Granulocytes: 0 %
Lymphocytes Relative: 28 %
Lymphs Abs: 1.4 10*3/uL (ref 0.7–4.0)
MCH: 25.9 pg — ABNORMAL LOW (ref 26.0–34.0)
MCHC: 31.5 g/dL (ref 30.0–36.0)
MCV: 82.2 fL (ref 80.0–100.0)
Monocytes Absolute: 0.5 10*3/uL (ref 0.1–1.0)
Monocytes Relative: 11 %
Neutro Abs: 2.7 10*3/uL (ref 1.7–7.7)
Neutrophils Relative %: 58 %
Platelets: 224 10*3/uL (ref 150–400)
RBC: 3.48 MIL/uL — ABNORMAL LOW (ref 4.22–5.81)
RDW: 14.6 % (ref 11.5–15.5)
WBC: 4.8 10*3/uL (ref 4.0–10.5)
nRBC: 0 % (ref 0.0–0.2)

## 2023-04-18 LAB — GLUCOSE, CAPILLARY
Glucose-Capillary: 86 mg/dL (ref 70–99)
Glucose-Capillary: 85 mg/dL (ref 70–99)
Glucose-Capillary: 115 mg/dL — ABNORMAL HIGH (ref 70–99)
Glucose-Capillary: 91 mg/dL (ref 70–99)

## 2023-04-18 LAB — ECHOCARDIOGRAM COMPLETE
Area-P 1/2: 4.21 cm2
Calc EF: 60 %
Height: 71 in
S' Lateral: 2.5 cm
Single Plane A2C EF: 66.6 %
Single Plane A4C EF: 50.1 %
Weight: 1530.87 [oz_av]

## 2023-04-18 MED ORDER — SODIUM CHLORIDE 0.9 % IV SOLN
1.0000 g | INTRAVENOUS | Status: DC
  Administered 2023-04-18: 1 g via INTRAVENOUS
  Filled 2023-04-18: qty 10

## 2023-04-18 NOTE — Progress Notes (Signed)
Progress Note   Patient: [name] WUJ:[id number] DOB: 11/11/1950 DOA: 04/16/2023     1 DOS: the patient was seen and examined on 04/18/2023   Brief hospital course: 72yo with h/o seizure d/o, HLD, HTN, hypothyroidism, ETOH use d/o with cirrhosis, chronic systolic CHF, and laryngeal cancer s/p laryngectomy who presented on 2/10 from the cancer center with hypotension, seizures, and a fall.  Seizure-like activity could be autonomic dysfunction vs. Convulsive syncope, and neurology requested transfer to [facility].  He was also noted to have an exposed screw head at the R lateral malleolus and orthopedics will consult.  Assessment and Plan:  Seizure-like activity Possibly autonomic dysfunction versus convulsive syncope Neuro requested to admission to [facility] Patient admitted to progressive care -> telemetry Frequent neurochecks Continuous video EEG ordered - negative so far, monitoring for one additional day Consult PT and OT Continue Keppra 500 mg p.o. twice daily Check carotid dopplers and echocardiogram Neurology was consulted He has not had any further episodes while here Orthostatics ordered qshift   Carotid stenosis 11/2022 carotid US with 60-79% R ICA stenosis and total occlusion of L ICA He is scheduled for repeat carotid US on 3/13 Will do now given presenting complaint Continue ASA Continue rosuvastatin - has only been on this for maybe a month or so LDL is at goal of <70   Exposed hardware Prior remote ankle surgery, now with hardware failure Not bothersome to patient currently and without signs of infection Orthopedics consulted Recommend keeping area covered with dry dressing F/u with [facility] post-discharge  Severe malnutrition Nutrition Problem: Severe Malnutrition Etiology: chronic illness, cancer and cancer related treatments Signs/Symptoms: severe fat depletion, severe muscle depletion Interventions: Ensure Enlive (each supplement provides  350kcal and 20 grams of protein), Magic cup, MVI, Education   CKD (chronic kidney disease), stage IIIa Appears to be stable at this time Attempt to avoid nephrotoxic medications Recheck BMP in AM  Also with urinary retention, continue finasteride No evidence of UTI on UA, given Ceftriaxone this was stopped Urine culture with .100k colonies of Staph Epi Will complete Ceftriaxone x 3 days   Tracheostomy status  Patient with h/o cancer of base of tongue and larynx Status post radiotherapy No external voice box so he is mute Follow-up with oncology   Hypoglycemia  Resolved Stop D5 infusion Check q4h CBG x 4 and then stop if normal   Hypoalbuminemia In the setting of anemia, cirrhosis and malignancy Nutrition consulted   Essential hypertension, benign Currently not on antihypertensives Initially hypotensive but this has resolved Has apparent h/o orthostasis, was on fludrocortisone but he is no longer taking this medication Will monitor for now without intervention   Hypothyroidism TSH normal 6 months ago Continue Synthroid   Chronic systolic heart failure Appears to be compensated Check echocardiogram   Hyperlipidemia Continue rosuvastatin    History of alcoholic cirrhosis No signs of decompensation Reports remission from ETOH use d/o           Consultants: Neurology Orthopedics PT OT Nutrition   Procedures: vLTM 2/11 Carotid US 2/11 Echocardiogram 2/11   Antibiotics: Ceftriaxone x 3 doses    30 Day Unplanned Readmission Risk Score    Flowsheet Row ED to Hosp-Admission (Current) from 04/16/2023 in [place] [facility]  30 Day Unplanned Readmission Risk Score (%) 12.71 Filed at 04/18/2023 0801       This score is the patient's risk of an unplanned readmission within 30 days of being discharged (0 -100%). The score  is based on dignosis, age, lab data, medications, orders, and past utilization.   Low:  0-14.9   Medium: 15-21.9   High: 22-29.9    Extreme: 30 and above           Subjective: No obvious concerns, no further events while hospitalized   Objective: Vitals:   04/18/23 1112 04/18/23 1530  BP: 137/88 121/81  Pulse: 84 81  Resp: 18 18  Temp: 97.8 F (36.6 C) 98.9 F (37.2 C)  SpO2: 93% 96%    Intake/Output Summary (Last 24 hours) at 04/18/2023 1548 Last data filed at 04/18/2023 0915 Gross per 24 hour  Intake 660.62 ml  Output 450 ml  Net 210.62 ml   Filed Weights   04/16/23 1842  Weight: 43.4 kg    Exam:  General:  Appears calm and comfortable and is in NAD Eyes:  EOMI, normal lids, iris ENT:  grossly normal hearing, lips & tongue, mmm Neck:  trach stoma appreciated, no external voice box present Cardiovascular:  RRR, no m/r/g. No LE edema.  Respiratory:   CTA bilaterally with no wheezes/rales/rhonchi.  Normal respiratory effort. Abdomen:  soft, NT, ND Skin:  no rash or induration seen on limited exam Musculoskeletal:  grossly normal tone BUE/BLE, good ROM, no bony abnormality Psychiatric:  blunted mood and affect, speech mute - attempts to communicate with mouth movements Neurologic:  CN 2-12 grossly intact, moves all extremities in coordinated fashion  Data Reviewed: I have reviewed the patient's lab results since admission.  Pertinent labs for today include:  Na++ 132 Lipids: 99/21/68/51 WBC 4.8 Hgb 9, 12.6 on 1/7; normal MCV   Family Communication: None present; spoke with his daughter yesterday and will plan to call again tomorrow  Disposition: Status is: Inpatient Remains inpatient appropriate because: ongoing monitoring     Time spent: 35 minutes  Unresulted Labs (From admission, onward)    None        Author: Jonah Blue, MD 04/18/2023 3:48 PM  For on call review [email or web address].

## 2023-04-18 NOTE — Progress Notes (Signed)
  Echocardiogram 2D Echocardiogram has been performed.  Janalyn Harder 04/18/2023, 10:46 AM

## 2023-04-18 NOTE — Progress Notes (Signed)
NEUROLOGY CONSULT FOLLOW UP NOTE   Date of service: April 18, 2023 Patient Name: [name] MRN:  [id number] DOB:  [birth year]  Interval Hx/subjective   No acute neurological events overnight.  Neuro exam stable.  No seizures on LTM, no epileptiform discharges.  Vitals   Vitals:   04/17/23 1946 04/17/23 2352 04/18/23 0344 04/18/23 0732  BP: (!) 152/95 (!) 138/92 124/88 (!) 145/91  Pulse: 82 82 86 85  Resp: 17 18 18 16   Temp: 98.3 F (36.8 C) 98.4 F (36.9 C) 98.8 F (37.1 C) 98.8 F (37.1 C)  TempSrc: Oral Oral Oral Oral  SpO2: 100% 99% 100% 100%  Weight:      Height:         Body mass index is 13.34 kg/m.  Physical Exam   Constitutional: Appears well-developed and well-nourished.  Psych: Affect appropriate to situation. Calm and cooperative.  Eyes: No scleral injection.  HENT: No OP obstrucion.  Head: Normocephalic.  Cardiovascular: Normal rate and regular rhythm.  Respiratory: Effort normal, non-labored breathing.  GI: Soft.  No distension. There is no tenderness.  Skin: WDI.   Neurologic Examination   Neuro: Mental Status: Patient is awake, alert.  Good attention and concentration.  Able to follow simple commands and mimics.  Language/Speech: He is nonverbal at baseline, but does nod yes and no appropriately for conversation. Cranial Nerves: II - XII grossly intact.  Motor: Tone is normal. Bulk is normal. 5/5 strength was present in all four extremities.  Sensory: Sensation is symmetric to light touch in the arms and legs. Cerebellar: No overt ataxia noted.    Medications  Current Facility-Administered Medications:    acetaminophen (TYLENOL) tablet 650 mg, 650 mg, Oral, Q6H PRN **OR** acetaminophen (TYLENOL) suppository 650 mg, 650 mg, Rectal, Q6H PRN, Bobette Mo, MD   aspirin EC tablet 81 mg, 81 mg, Oral, Daily, Bobette Mo, MD, 81 mg at 04/17/23 1013   cyanocobalamin (VITAMIN B12) tablet 1,000 mcg, 1,000 mcg, Oral,  Daily, Bobette Mo, MD, 1,000 mcg at 04/17/23 1013   dextrose 5 %-0.9 % sodium chloride infusion, , Intravenous, Continuous, Jonah Blue, MD, Last Rate: 40 mL/hr at 04/18/23 0102, New Bag at 04/18/23 0102   dextrose 50 % solution 12.5 g, 12.5 g, Intravenous, PRN, Bobette Mo, MD   dextrose 50 % solution 12.5 g, 12.5 g, Intravenous, Once, Bobette Mo, MD   finasteride (PROSCAR) tablet 5 mg, 5 mg, Oral, Daily, Bobette Mo, MD, 5 mg at 04/17/23 1013   folic acid (FOLVITE) tablet 1 mg, 1 mg, Oral, Daily, Bobette Mo, MD, 1 mg at 04/17/23 1013   hydrALAZINE (APRESOLINE) tablet 25 mg, 25 mg, Oral, Q6H PRN, Opyd, Lavone Neri, MD, 25 mg at 04/16/23 2015   levETIRAcetam (KEPPRA) tablet 500 mg, 500 mg, Oral, BID, Bobette Mo, MD, 500 mg at 04/17/23 2111   levothyroxine (SYNTHROID) tablet 75 mcg, 75 mcg, Oral, QAC breakfast, Bobette Mo, MD, 75 mcg at 04/18/23 0542   ondansetron (ZOFRAN) tablet 4 mg, 4 mg, Oral, Q6H PRN **OR** ondansetron (ZOFRAN) injection 4 mg, 4 mg, Intravenous, Q6H PRN, Bobette Mo, MD   oxyCODONE (Oxy IR/ROXICODONE) immediate release tablet 5 mg, 5 mg, Oral, Q4H PRN, Jonah Blue, MD, 5 mg at 04/17/23 2212   rosuvastatin (CRESTOR) tablet 5 mg, 5 mg, Oral, QHS, Bobette Mo, MD, 5 mg at 04/17/23 2111  Labs and Diagnostic Imaging   CBC:  Recent Labs  Lab 04/16/23 1052  04/17/23 0531 04/18/23 0543  WBC 4.8 4.3 4.8  NEUTROABS 2.7  --  2.7  HGB 11.4* 10.0* 9.0*  HCT 37.3* 31.6* 28.6*  MCV 84.0 82.9 82.2  PLT 258 211 224    Basic Metabolic Panel:  Lab Results  Component Value Date   NA 132 (L) 04/18/2023   K 4.2 04/18/2023   CO2 27 04/18/2023   GLUCOSE 94 04/18/2023   BUN 9 04/18/2023   CREATININE 1.09 04/18/2023   CALCIUM 8.1 (L) 04/18/2023   GFRNONAA >60 04/18/2023   GFRAA >60 07/23/2019   Lipid Panel:  Lab Results  Component Value Date   LDLCALC 68 04/18/2023   HgbA1c:  Lab Results   Component Value Date   HGBA1C 5.5 04/15/2022   Urine Drug Screen:     Component Value Date/Time   LABOPIA NONE DETECTED 05/28/2014 0900   COCAINSCRNUR NONE DETECTED 05/28/2014 0900   COCAINSCRNUR NEGATIVE 09/26/2010 0030   LABBENZ NONE DETECTED 05/28/2014 0900   LABBENZ NEGATIVE 09/26/2010 0030   AMPHETMU NONE DETECTED 05/28/2014 0900   THCU NONE DETECTED 05/28/2014 0900   LABBARB NONE DETECTED 05/28/2014 0900    Alcohol Level     Component Value Date/Time   ETH <10 05/08/2022 0820   INR  Lab Results  Component Value Date   INR 1.3 (H) 04/14/2022   APTT  Lab Results  Component Value Date   APTT 36 04/14/2022   AED levels:  Lab Results  Component Value Date   LEVETIRACETA 22.7 04/16/2023    CT Head without contrast(Personally reviewed) 04/16/2023: 1. No evidence of acute intracranial abnormality. 2. Mild chronic small vessel ischemic disease. 3. Unchanged planum sphenoidale meningioma.   MRI Brain(Personally reviewed): 1. No acute intracranial abnormality. 2. Mild chronic small vessel ischemic disease. 3. 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 No T2 hyper intensities around meningioma.    Continuous EEG: pending  Assessment   73 y.o. male with hx of epilepsy, hypertension, hyperlipidemia, hypothyroidism, history of subarachnoid hemorrhage, alcoholic disorder, liver cirrhosis, urinary tension, CHF, CKD, laryngeal cancer status post laryngectomy who presented for hypotension, seizures, and fall   Impression: convulsive syncope versus seziures. LTM for spell capture. No spells captured so far. Orthostasis attempted yesterday and did not trigger a spell.  Recommendations  - Continue LTM EEG. If negative tomorrow AM, will discontinue tomorrow. - Continue Keppra 500mg  BID ______________________________________________________________________   Pt seen by Neuro NP/APP and later by MD. Note/plan to be edited by MD as needed.     Signed,   Lynnae January, DNP, AGACNP-BC [facility] Please use AMION for contact information & EPIC for messaging.   NEUROHOSPITALIST ADDENDUM Performed a face to face diagnostic evaluation.   I have reviewed the contents of history and physical exam as documented by PA/ARNP/Resident and agree with above documentation.  I have discussed and formulated the above plan as documented. Edits to the note have been made as needed.  Erick Blinks, MD [facility] [phone]   If 7pm to 7am, please call on call as listed on AMION.

## 2023-04-18 NOTE — Progress Notes (Signed)
EEG maint, no skin breakdown on P3 Cz Fp1

## 2023-04-18 NOTE — Discharge Instructions (Signed)

## 2023-04-18 NOTE — Procedures (Signed)
Patient Name: Murel Shenberger  MRN: [id number]  Epilepsy Attending: Charlsie Quest  Referring Physician/Provider: Erick Blinks, MD  Duration: 04/17/2023 5284 to 04/18/2023 1324  Patient history: 73yo M with h/o seizure now with seizure like episodes  Level of alertness: Awake, asleep  AEDs during EEG study: LEV  Technical aspects: This EEG study was done with scalp electrodes positioned according to the 10-20 International system of electrode placement. Electrical activity was reviewed with band pass filter of 1-70Hz , sensitivity of 7 uV/mm, display speed of 3mm/sec with a 60Hz  notched filter applied as appropriate. EEG data were recorded continuously and digitally stored.  Video monitoring was available and reviewed as appropriate.  Description: The posterior dominant rhythm consists of 8 Hz activity of moderate voltage (25-35 uV) seen predominantly in posterior head regions, symmetric and reactive to eye opening and eye closing. Sleep was characterized by vertex waves, sleep spindles (12 to 14 Hz), maximal frontocentral region. Hyperventilation and photic stimulation were not performed.     IMPRESSION: This study is within normal limits. No seizures or epileptiform discharges were seen throughout the recording.  A normal interictal EEG does not exclude the diagnosis of epilepsy.  Larnce Schnackenberg Annabelle Harman

## 2023-04-18 NOTE — Progress Notes (Signed)
Initial Nutrition Assessment  DOCUMENTATION CODES:  Severe malnutrition in context of chronic illness, Underweight  INTERVENTION:  Continue regular diet as ordered Ensure Plus High Protein po TID, each supplement provides 350 kcal and 20 grams of protein. Magic cup TID with meals, each supplement provides 290 kcal and 9 grams of protein MVI with minerals daily "High Calorie, High Protein Nutrition Therapy" handout added to AVS  NUTRITION DIAGNOSIS:  Severe Malnutrition related to chronic illness, cancer and cancer related treatments as evidenced by severe fat depletion, severe muscle depletion  GOAL:  Patient will meet greater than or equal to 90% of their needs  MONITOR:  PO intake, Supplement acceptance, Labs, Weight trends  REASON FOR ASSESSMENT:  Consult Assessment of nutrition requirement/status  ASSESSMENT:  Pt presented from the cancer center with hypotension, seizures and a fall. PMH significant for seizure d/o, HLD, HTN, hypothyroidism, ETOH use, cirrhosis, chronic systolic CHF and laryngeal cancer s/p laryngectomy.  Patient continues on LTM EEG to assess for convulsive syncope versus seizures.   Spoke with pt at bedside. Pt is s/p laryngectomy so history from pt is limited and expressed through yes/no and thumbs up/downs and mouths some words.  Caregiver present who sits with him in the mornings. She plans to order next 3 meals for him.  Pt reports that his appetite has been going down. His caregiver reports that this is d/t pain.  He nods yes to eating 3 meals per day, though they are small.  He drinks Ensure and states that he consumes 3 per day and likes all flavors.  Pt mentions that he "sometimes" sees a RD at the cancer center though do not see notes from OP RD at the [facility]. Patient would likely benefit from ongoing outpatient follow up. Will forward note to OP RD's.    Meal completions:  2/11: 20% lunch  Pt reports that his weight has been going down  more recently. Unable to quantify how much. Review of weight history reflects pt with a weight loss of 8.1% since December which is clinically significant for time frame.  Pt endorses constipation but mentions that his stomach feels ok today.   Medications: Vitamin B12 daily, folvite, D5 @ 80ml/hr  Labs:  Sodium 132 CBG's 64-100 x24 hours  NUTRITION - FOCUSED PHYSICAL EXAM: Flowsheet Row Most Recent Value  Orbital Region Severe depletion  Upper Arm Region Severe depletion  Thoracic and Lumbar Region Severe depletion  Buccal Region Severe depletion  Temple Region Severe depletion  Clavicle Bone Region Severe depletion  Clavicle and Acromion Bone Region Severe depletion  Scapular Bone Region Severe depletion  Dorsal Hand Severe depletion  Patellar Region Severe depletion  Anterior Thigh Region Severe depletion  Posterior Calf Region Severe depletion  Edema (RD Assessment) None  Hair Reviewed  Eyes Reviewed  Mouth Other (Comment)  [edentulous]  Skin Reviewed  Nails Other (Comment)  [poor capillary refill\]   Diet Order:   Diet Order             Diet regular Fluid consistency: Thin  Diet effective now                   EDUCATION NEEDS:  Education needs have been addressed  Skin:  Skin Assessment: Reviewed RN Assessment  Last BM:  2/11  Height:  Ht Readings from Last 1 Encounters:  04/16/23 5\' 11"  (1.803 m)    Weight:  Wt Readings from Last 1 Encounters:  04/16/23 43.4 kg   BMI:  Body  mass index is 13.34 kg/m.  Estimated Nutritional Needs:  Kcal:  1600-1800 Protein:  80-95g Fluid:  >/=1.6L  Drusilla Kanner, RDN, LDN Clinical Nutrition

## 2023-04-18 NOTE — Plan of Care (Signed)
  Problem: Clinical Measurements: Goal: Ability to maintain clinical measurements within normal limits will improve Outcome: Progressing Goal: Will remain free from infection Outcome: Progressing Goal: Diagnostic test results will improve Outcome: Progressing Goal: Respiratory complications will improve Outcome: Progressing Goal: Cardiovascular complication will be avoided Outcome: Progressing   Problem: Elimination: Goal: Will not experience complications related to bowel motility Outcome: Progressing Goal: Will not experience complications related to urinary retention Outcome: Progressing   Problem: Safety: Goal: Ability to remain free from injury will improve Outcome: Progressing   Problem: Pain Managment: Goal: General experience of comfort will improve and/or be controlled Outcome: Progressing   Problem: Skin Integrity: Goal: Risk for impaired skin integrity will decrease Outcome: Progressing

## 2023-04-19 ENCOUNTER — Encounter (HOSPITAL_COMMUNITY): Payer: Medicare Other

## 2023-04-19 DIAGNOSIS — G40909 Epilepsy, unspecified, not intractable, without status epilepticus: Secondary | ICD-10-CM

## 2023-04-19 DIAGNOSIS — I951 Orthostatic hypotension: Secondary | ICD-10-CM | POA: Diagnosis not present

## 2023-04-19 DIAGNOSIS — R569 Unspecified convulsions: Secondary | ICD-10-CM | POA: Diagnosis not present

## 2023-04-19 LAB — CBC WITH DIFFERENTIAL/PLATELET
Abs Immature Granulocytes: 0.02 10*3/uL (ref 0.00–0.07)
Basophils Absolute: 0 10*3/uL (ref 0.0–0.1)
Basophils Relative: 0 %
Eosinophils Absolute: 0.1 10*3/uL (ref 0.0–0.5)
Eosinophils Relative: 3 %
HCT: 34.2 % — ABNORMAL LOW (ref 39.0–52.0)
Hemoglobin: 10.9 g/dL — ABNORMAL LOW (ref 13.0–17.0)
Immature Granulocytes: 0 %
Lymphocytes Relative: 27 %
Lymphs Abs: 1.3 10*3/uL (ref 0.7–4.0)
MCH: 26.1 pg (ref 26.0–34.0)
MCHC: 31.9 g/dL (ref 30.0–36.0)
MCV: 81.8 fL (ref 80.0–100.0)
Monocytes Absolute: 0.6 10*3/uL (ref 0.1–1.0)
Monocytes Relative: 11 %
Neutro Abs: 2.9 10*3/uL (ref 1.7–7.7)
Neutrophils Relative %: 59 %
Platelets: 223 10*3/uL (ref 150–400)
RBC: 4.18 MIL/uL — ABNORMAL LOW (ref 4.22–5.81)
RDW: 14.5 % (ref 11.5–15.5)
WBC: 4.9 10*3/uL (ref 4.0–10.5)
nRBC: 0 % (ref 0.0–0.2)

## 2023-04-19 LAB — BASIC METABOLIC PANEL
Anion gap: 8 (ref 5–15)
BUN: 8 mg/dL (ref 8–23)
CO2: 27 mmol/L (ref 22–32)
Calcium: 8.4 mg/dL — ABNORMAL LOW (ref 8.9–10.3)
Chloride: 96 mmol/L — ABNORMAL LOW (ref 98–111)
Creatinine, Ser: 1.18 mg/dL (ref 0.61–1.24)
GFR, Estimated: >60 mL/min (ref >60)
Glucose, Bld: 102 mg/dL — ABNORMAL HIGH (ref 70–99)
Potassium: 4.5 mmol/L (ref 3.5–5.1)
Sodium: 131 mmol/L — ABNORMAL LOW (ref 135–145)

## 2023-04-19 LAB — GLUCOSE, CAPILLARY: Glucose-Capillary: 96 mg/dL (ref 70–99)

## 2023-04-19 MED ORDER — OXYCODONE HCL 5 MG PO TABS: 5.0000 mg | ORAL_TABLET | ORAL | 0 refills | Status: DC | PRN

## 2023-04-19 NOTE — Procedures (Addendum)
Patient Name: [name]  MRN: [id number]  Epilepsy Attending: Charlsie Quest  Referring Physician/Provider: Erick Blinks, MD  Duration: 04/18/2023 6295 to 04/19/2023 2841   Patient history: 73yo M with h/o seizure now with seizure like episodes   Level of alertness: Awake, asleep   AEDs during EEG study: LEV   Technical aspects: This EEG study was done with scalp electrodes positioned according to the 10-20 International system of electrode placement. Electrical activity was reviewed with band pass filter of 1-70Hz , sensitivity of 7 uV/mm, display speed of 58mm/sec with a 60Hz  notched filter applied as appropriate. EEG data were recorded continuously and digitally stored.  Video monitoring was available and reviewed as appropriate.   Description: The posterior dominant rhythm consists of 8 Hz activity of moderate voltage (25-35 uV) seen predominantly in posterior head regions, symmetric and reactive to eye opening and eye closing. Sleep was characterized by vertex waves, sleep spindles (12 to 14 Hz), maximal frontocentral region. Hyperventilation and photic stimulation were not performed.      IMPRESSION: This study is within normal limits. No seizures or epileptiform discharges were seen throughout the recording.   A normal interictal EEG does not exclude the diagnosis of epilepsy.   [name]

## 2023-04-19 NOTE — TOC Transition Note (Addendum)
Transition of Care [facility]) - Discharge Note   Patient Details  Name: [name] MRN: [id number] Date of Birth: 05/26/1950  [facility] CM/SW Contact:  Kermit Balo, RN Phone Number: 04/19/2023, 2:57 PM   Clinical Narrative:     Pt is discharging home with home health services through [facility]. Pt states he has used them in the past and would like to use them again. Information on the AVS. [facility] will contact him for the first home visit. No new DME needs.  Pt has support at home and transportation to home.  1526: wheelchair and BSC ordered from [employer] and will be delivered to pts home.  Final next level of care: Home w Home Health Services Barriers to Discharge: No Barriers Identified   Patient Goals and CMS Choice   CMS Medicare.gov Compare Post Acute Care list provided to:: Patient Choice offered to / list presented to : Patient      Discharge Placement                       Discharge Plan and Services Additional resources added to the After Visit Summary for                            [facility] Arranged: PT, OT [facility] Agency: [facility] Date [facility] Agency Contacted: 04/19/23   Representative spoke with at [facility] Agency: Haywood Lasso  Social Drivers of Health (SDOH) Interventions SDOH Screenings   Food Insecurity: No Food Insecurity (04/17/2023)  Housing: Low Risk  (04/19/2023)  Transportation Needs: No Transportation Needs (04/17/2023)  Utilities: Not At Risk (04/17/2023)  Depression (PHQ2-9): Medium Risk (10/09/2022)  Financial Resource Strain: Low Risk  (03/08/2023)   Received from [facility]  Physical Activity: Unknown (03/08/2023)   Received from [facility]  Social Connections: Socially Isolated (04/17/2023)  Stress: No Stress Concern Present (03/08/2023)   Received from [facility]  Tobacco Use: Medium Risk (04/16/2023)     Readmission Risk Interventions     No data to display

## 2023-04-19 NOTE — Progress Notes (Signed)
DISCHARGE NOTE HOME Kayvan Chester Sibert to be discharged Home per MD order. Discussed prescriptions and follow up appointments with the patient. Prescriptions given to patient; medication list explained in detail. Patient verbalized understanding.  Skin clean, dry and intact without evidence of skin break down, no evidence of skin tears noted. IV catheter discontinued intact. Site without signs and symptoms of complications. Dressing and pressure applied. Pt denies pain at the site currently. No complaints noted.  Patient free of lines, drains, and wounds.   An After Visit Summary (AVS) was printed and given to the patient. Patient escorted via wheelchair, and discharged home via private auto.  Velia Meyer, RN

## 2023-04-19 NOTE — Evaluation (Signed)
Occupational Therapy Evaluation Patient Details Name: [name] MRN: [id number] DOB: 11-25-1950 Today's Date: 04/19/2023   History of Present Illness   Patient is 73 y.o. male who presented on 2/10 from the cancer center with hypotension, seizures, and a fall.  Seizure-like activity could be autonomic dysfunction vs. Convulsive syncope, and neurology requested transfer to [facility]. PMH significant for seizure d/o, HLD, HTN, hypothyroidism, ETOH use d/o with cirrhosis, chronic systolic CHF, and laryngeal cancer s/p laryngectomy.     Clinical Impressions PTA pt lives with his daughter and her wife who assist as needed with ADL and IADL tasks. Pt has an aid 5 days/wk, 2 hrs/day. Spoke with daughter over the phone and she states her Dad is significantly weaker than normal such that they have to push him around using his rollator at times. Pt overall min A with mobility and ADL and fatigues easily. BP 82/62 sitting and 77/50 standing - pt symptomatic. Discussed use of abfominal binder and TED hose over the phone with daughter. Recommend direct assistance with all mobility and ADL task with follow up HHOT. Pt/daughter verbalized understanding. Discussed DC needs with CM. All further needs can be addressed by HHOT.      If plan is discharge home, recommend the following:   A little help with walking and/or transfers;A little help with bathing/dressing/bathroom;Assistance with cooking/housework;Direct supervision/assist for medications management;Direct supervision/assist for financial management;Assist for transportation;Help with stairs or ramp for entrance     Functional Status Assessment   Patient has had a recent decline in their functional status and demonstrates the ability to make significant improvements in function in a reasonable and predictable amount of time.     Equipment Recommendations   BSC/3in1;Wheelchair (measurements OT);Wheelchair cushion (measurements OT)      Recommendations for Other Services         Precautions/Restrictions   Precautions Precautions: Fall Recall of Precautions/Restrictions: Intact Restrictions Weight Bearing Restrictions Per Provider Order: No     Mobility Bed Mobility Overal bed mobility: Needs Assistance Bed Mobility: Supine to Sit     Supine to sit: Supervision, HOB elevated, Used rails     General bed mobility comments: use of bed features, sup for safety    Transfers Overall transfer level: Needs assistance Equipment used: Rollator (4 wheels) Transfers: Sit to/from Stand Sit to Stand: Contact guard assist           General transfer comment: close CGA for safety, no overt LOB noted.      Balance Overall balance assessment: History of Falls, Needs assistance Sitting-balance support: Feet supported Sitting balance-[name]: Fair       Standing balance-[name]: Poor                             ADL either performed or assessed with clinical judgement   ADL                                               Vision         Perception         Praxis         Pertinent Vitals/Pain Pain Assessment Pain Assessment: Faces Faces Pain Scale: Hurts a little bit Pain Location: L side of head Pain Descriptors / Indicators: Discomfort Pain Intervention(s): Limited activity within patient's tolerance  Extremity/Trunk Assessment Upper Extremity Assessment Upper Extremity Assessment: Generalized weakness   Lower Extremity Assessment Lower Extremity Assessment: Defer to PT evaluation   Cervical / Trunk Assessment Cervical / Trunk Assessment:  (frail)   Communication Communication Communication: Impaired Factors Affecting Communication: Trach/intubated (chronic trach)   Cognition Arousal: Alert Behavior During Therapy: WFL for tasks assessed/performed Cognition: No apparent impairments (daughter states at baseline)                                Following commands: Intact       Cueing  General Comments   Cueing Techniques: Verbal cues;Visual cues      Exercises     Shoulder Instructions      Home Living Family/patient expects to be discharged to:: Private residence Living Arrangements: Children Available Help at Discharge: Family Type of Home: House Home Access: Stairs to enter Secretary/administrator of Steps: 2-3 Entrance Stairs-Rails: Can reach both Home Layout: Able to live on main level with bedroom/bathroom     Bathroom Shower/Tub: Chief Strategy Officer: Standard Bathroom Accessibility: Yes How Accessible: Accessible via walker Home Equipment: Rollator (4 wheels);Grab bars - toilet;Other (comment) (urinal with hose)          Prior Functioning/Environment Prior Level of Function : Needs assist       Physical Assist : Mobility (physical);ADLs (physical) Mobility (physical): Gait;Bed mobility ADLs (physical): Dressing;Bathing;IADLs Mobility Comments: uses rollator for mobility at all times; increased weakness lastely and more difficulty with ambulating; familypushing him around on his rollator ADLs Comments: aid & daughter assist with ADL, daughter provides transportation    OT Problem List: Decreased strength;Decreased activity tolerance;Impaired balance (sitting and/or standing);Decreased safety awareness;Decreased knowledge of use of DME or AE   OT Treatment/Interventions:        OT Goals(Current goals can be found in the care plan section)   Acute Rehab OT Goals Patient Stated Goal: per daughter for her Dad to get stronger and have improved BP OT Goal Formulation: All assessment and education complete, DC therapy   OT Frequency:       Co-evaluation              AM-PAC OT "6 Clicks" Daily Activity     Outcome Measure Help from another person eating meals?: None Help from another person taking care of personal grooming?: A Little Help from another  person toileting, which includes using toliet, bedpan, or urinal?: A Little Help from another person bathing (including washing, rinsing, drying)?: A Little Help from another person to put on and taking off regular upper body clothing?: A Little Help from another person to put on and taking off regular lower body clothing?: A Little 6 Click Score: 19   End of Session Equipment Utilized During Treatment: Gait belt;Rolling walker (2 wheels) Nurse Communication: Mobility status;Other (comment) (DC needs)  Activity Tolerance: Patient tolerated treatment well Patient left: in bed;with call bell/phone within reach;with bed alarm set  OT Visit Diagnosis: Unsteadiness on feet (R26.81);Muscle weakness (generalized) (M62.81);History of falling (Z91.81)                Time: 1610-9604 OT Time Calculation (min): 37 min Charges:  OT General Charges $OT Visit: 1 Visit OT Evaluation $OT Eval Low Complexity: 1 Low OT Treatments $Self Care/Home Management : 8-22 mins  Luisa Dago, OT/L   Acute OT Clinical Specialist Acute Rehabilitation Services Pager [phone] Office [phone]   [facility] 04/19/2023, 3:54  PM

## 2023-04-19 NOTE — Discharge Summary (Signed)
Physician Discharge Summary   Patient: [name] MRN: [id number] DOB: 09-Aug-1950  Admit date:     04/16/2023  Discharge date: 04/19/23  Discharge Physician: Jonah Blue   PCP: Julien Girt, PA-C   Recommendations at discharge:   Symptoms appear to be related to orthostatic hypotension (BP drops with changes in position from lying to sitting to standing) Wear TED hose and abdominal binder  Take time between changes in position to allow BP to equilibrate in order to prevent falls You are being discharged with home health PT/OT Follow up with vascular surgery next month as scheduled Follow up with PA [facility] in 1-2 weeks Follow up with [facility] after discharge re: R ankle screw  Discharge Diagnoses: Principal Problem:   Seizure-like activity (HCC) Active Problems:   Malignant neoplasm of larynx (HCC)   Essential hypertension, benign   Hypothyroidism   Chronic systolic heart failure (HCC)   Orthostatic hypotension   Hyperlipidemia   History of cirrhosis of liver   Chronic kidney disease, stage 3a (HCC)   Alcohol abuse   Urinary retention   Malignant neoplasm of base of tongue (HCC)   Tracheostomy status (HCC)   Hypoglycemia   Carotid stenosis, bilateral   Hospital Course: 73yo with h/o seizure d/o, HLD, HTN, hypothyroidism, ETOH use d/o with cirrhosis, chronic systolic CHF, and laryngeal cancer s/p laryngectomy who presented on 2/10 from the cancer center with hypotension, seizures, and a fall.  Seizure-like activity could be autonomic dysfunction vs. Convulsive syncope, and neurology requested transfer to [facility].  He was also noted to have an exposed screw head at the R lateral malleolus and orthopedics will consult.  Assessment and Plan:  Seizure-like activity due to orthostasis Concern for autonomic dysfunction versus convulsive syncope Neuro requested to admission to [facility] Patient admitted to progressive care -> telemetry Continuous  video EEG ordered - negative  Appears to be related to ortohstatic hypotension Consult PT and OT - recommend home therapy services Continue Keppra 500 mg p.o. twice daily Reassuring carotid dopplers and echocardiogram Neurology was consulted He has not had any further episodes while here Orthostatics ordered and he remains orthostatic   Carotid stenosis 11/2022 carotid US with 60-79% R ICA stenosis and total occlusion of L ICA RCA stenosis is now improved, 1-39%; L ICA remains occluded Continue ASA Continue rosuvastatin - has only been on this for maybe a month or so LDL is at goal of <70 F/u with vascular surgery   Exposed hardware Prior remote ankle surgery, now with hardware failure Not bothersome to patient currently and without signs of infection Orthopedics consulted Recommend keeping area covered with dry dressing F/u with [facility] post-discharge   Severe malnutrition Nutrition Problem: Severe Malnutrition Etiology: chronic illness, cancer and cancer related treatments Signs/Symptoms: severe fat depletion, severe muscle depletion Interventions: Ensure Enlive (each supplement provides 350kcal and 20 grams of protein), Magic cup, MVI, Education   CKD (chronic kidney disease), stage IIIa Appears to be stable at this time Attempt to avoid nephrotoxic medications Recheck BMP in AM  Also with urinary retention, continue finasteride No evidence of UTI on UA, given Ceftriaxone this was stopped Urine culture with .100k colonies of Staph Epi Completed Ceftriaxone x 3 days   Tracheostomy status  Patient with h/o cancer of base of tongue and larynx Status post radiotherapy No external voice box so he is mute Follow-up with oncology   Hypoglycemia  Resolved Stop D5 infusion Check q4h CBG x 4 and then stop if normal  Hypoalbuminemia due to severe malnutrition In the setting of anemia, cirrhosis and malignancy Nutrition consulted Nutrition Problem: Severe  Malnutrition Etiology: chronic illness, cancer and cancer related treatments Signs/Symptoms: severe fat depletion, severe muscle depletion Interventions: Ensure Enlive (each supplement provides 350kcal and 20 grams of protein), Magic cup, MVI, Education   Essential hypertension vs. Orthostatic hypotension Currently not on antihypertensives Initially hypotensive but this has resolved Has apparent h/o orthostasis, was on fludrocortisone but he is no longer taking this medication Orthostatics with BP 146/100 sitting and 86/60, 99/76 standing on 2/11 Needs TED hose and abdominal binder, nonpharmacologic treatments for orthostasis per neurology   Hypothyroidism TSH normal 6 months ago Continue Synthroid  Chronic combined heart failure Appears to be compensated Echocardiogram with EF 50-55%, grade 1 diastolic dysfunction, no significant valvular abnormalities   Hyperlipidemia Continue rosuvastatin    History of alcoholic cirrhosis No signs of decompensation Reports remission from ETOH use d/o           Consultants: Neurology Orthopedics PT OT Nutrition   Procedures: vLTM 2/11 Carotid US 2/11 Echocardiogram 2/11   Antibiotics: Ceftriaxone x 3 doses  Pain control - [place] Controlled Substance Reporting System database was reviewed. and patient was instructed, not to drive, operate heavy machinery, perform activities at heights, swimming or participation in water activities or provide baby-sitting services while on Pain, Sleep and Anxiety Medications; until their outpatient Physician has advised to do so again. Also recommended to not to take more than prescribed Pain, Sleep and Anxiety Medications.   Disposition: Home Diet recommendation:  Regular diet DISCHARGE MEDICATION: Allergies as of 04/19/2023       Reactions   Succinylcholine Other (See Comments)   ADVERSE REACTION (not an allergy) Following 1st attempt to place tracheotomy tube in 2011 at [facility]; patient went into "succinylcholine induced pulseless electrical activity secondary to probable hyperkalemia" and underwent chest compressions and placement of endotracheal tube with admission to medical ICU.  EXTREMELY HIGH SEVERE REACTION.    Tylenol [acetaminophen] Other (See Comments), Hypertension   HBP -Liver Cirrhosis   Vicodin [hydrocodone-acetaminophen] Rash   Other    Unknown anesthesia medicine caused cardiac arrest.   Tamsulosin    Causing BP to drop (avoid due to orthostatic hypotension)   Protonix [pantoprazole] Swelling, Rash        Medication List     STOP taking these medications    fludrocortisone 0.1 MG tablet Commonly known as: FLORINEF       TAKE these medications    aspirin EC 81 MG tablet Take 81 mg by mouth daily. Swallow whole.   cyanocobalamin 500 MCG tablet Commonly known as: VITAMIN B12 Take 2 tablets (1,000 mcg total) by mouth daily.   diclofenac Sodium 1 % Gel Commonly known as: VOLTAREN Apply 2 g topically 2 (two) times daily as needed (pain).   finasteride 5 MG tablet Commonly known as: PROSCAR Take 1 tablet (5 mg total) by mouth daily.   folic acid 1 MG tablet Commonly known as: FOLVITE Take 1 mg by mouth daily.   levETIRAcetam 500 MG tablet Commonly known as: Keppra Take 1 tablet (500 mg total) by mouth 2 (two) times daily.   levothyroxine 75 MCG tablet Commonly known as: SYNTHROID Take 75 mcg by mouth daily before breakfast.   NEOMYCIN-POLYMYXIN-HYDROCORTISONE 1 % Soln OTIC solution Commonly known as: CORTISPORIN Place 4 drops into the left ear 3 (three) times daily as needed (Irritation).   oxyCODONE 5 MG immediate release tablet Commonly known as: Oxy  IR/ROXICODONE Take 1 tablet (5 mg total) by mouth every 4 (four) hours as needed for severe pain (pain score 7-10).   rosuvastatin 5 MG tablet Commonly known as: CRESTOR Take 5 mg by mouth at bedtime.        Discharge Exam:   Subjective: Reports  left-sided headache.  Otherwise no apparent concern.   Objective: Vitals:   04/19/23 0729 04/19/23 1209  BP: (!) 142/102 (!) 139/92  Pulse: 89 80  Resp: 18 18  Temp: 98.2 F (36.8 C) 98.8 F (37.1 C)  SpO2: 99% 95%    Intake/Output Summary (Last 24 hours) at 04/19/2023 1421 Last data filed at 04/19/2023 1100 Gross per 24 hour  Intake 708.81 ml  Output 1175 ml  Net -466.19 ml   Filed Weights   04/16/23 1842  Weight: 43.4 kg    Exam:  General:  Appears calm and comfortable and is in NAD Eyes:  EOMI, normal lids, iris ENT:  grossly normal hearing, lips & tongue, mmm Neck:  trach stoma appreciated, no external voice box present Cardiovascular:  RRR, no m/r/g. No LE edema.  Respiratory:   CTA bilaterally with no wheezes/rales/rhonchi.  Normal respiratory effort. Abdomen:  soft, NT, ND Skin:  no rash or induration seen on limited exam Musculoskeletal:  grossly normal tone BUE/BLE, good ROM, no bony abnormality Psychiatric:  blunted mood and affect, speech mute - attempts to communicate with mouth movements Neurologic:  CN 2-12 grossly intact, moves all extremities in coordinated fashion  Data Reviewed: I have reviewed the patient's lab results since admission.  Pertinent labs for today include:  Na++ 131, stable WBC 4.9 Hgb 10.9, stable     Condition at discharge: stable  The results of significant diagnostics from this hospitalization (including imaging, microbiology, ancillary and laboratory) are listed below for reference.   Imaging Studies: VAS US CAROTID Result Date: 04/18/2023 Carotid Arterial Duplex Study Patient Name:  [name]  Date of Exam:   04/18/2023 Medical Rec #: [id number]                Accession #:    [id number] Date of Birth: 10/21/1950                 Patient Gender: M Patient Age:   81 years Exam Location:  [facility] Procedure:      VAS US CAROTID Referring Phys: Jonah Blue  --------------------------------------------------------------------------------  Indications:       Carotid artery disease. Risk Factors:      Hypertension, hyperlipidemia, past history of smoking. Comparison Study:  Previous study on 3.8.2024. Performing Technologist: Fernande Bras  Examination Guidelines: A complete evaluation includes B-mode imaging, spectral Doppler, color Doppler, and power Doppler as needed of all accessible portions of each vessel. Bilateral testing is considered an integral part of a complete examination. Limited examinations for reoccurring indications may be performed as noted.  Right Carotid Findings: +----------+--------+--------+--------+------------------+--------+           PSV cm/sEDV cm/sStenosisPlaque DescriptionComments +----------+--------+--------+--------+------------------+--------+ CCA Prox  98      35                                         +----------+--------+--------+--------+------------------+--------+ CCA Distal74      28                                         +----------+--------+--------+--------+------------------+--------+  ICA Prox  92      33              heterogenous      tortuous +----------+--------+--------+--------+------------------+--------+ ICA Mid   89      36                                tortuous +----------+--------+--------+--------+------------------+--------+ ICA Distal82      39                                tortuous +----------+--------+--------+--------+------------------+--------+ ECA       93      16                                         +----------+--------+--------+--------+------------------+--------+ +----------+--------+-------+--------+-------------------+           PSV cm/sEDV cmsDescribeArm Pressure (mmHG) +----------+--------+-------+--------+-------------------+ [id number]                                          +----------+--------+-------+--------+-------------------+ +---------+--------+--+--------+--+ VertebralPSV cm/s53EDV cm/s19 +---------+--------+--+--------+--+  Left Carotid Findings: +----------+--------+--------+--------+-------------------------+--------+           PSV cm/sEDV cm/sStenosisPlaque Description       Comments +----------+--------+--------+--------+-------------------------+--------+ CCA Prox  68      8                                                 +----------+--------+--------+--------+-------------------------+--------+ CCA Distal23      6                                                 +----------+--------+--------+--------+-------------------------+--------+ ICA Prox                  Occludedheterogenous and calcific         +----------+--------+--------+--------+-------------------------+--------+ ICA Mid                   Occluded                                  +----------+--------+--------+--------+-------------------------+--------+ ICA Distal                Occluded                                  +----------+--------+--------+--------+-------------------------+--------+ ECA       127     18                                                +----------+--------+--------+--------+-------------------------+--------+ +----------+--------+--------+--------+-------------------+           PSV cm/sEDV cm/sDescribeArm Pressure (mmHG) +----------+--------+--------+--------+-------------------+ [id number]  17                                  +----------+--------+--------+--------+-------------------+ +---------+--------+--+--------+--+ VertebralPSV cm/s91EDV cm/s37 +---------+--------+--+--------+--+   Summary: Right Carotid: Velocities in the right ICA are consistent with a 1-39% stenosis. Left Carotid: Left ICA occluded Vertebrals:  Bilateral vertebral arteries demonstrate antegrade flow. Subclavians: Normal flow  hemodynamics were seen in bilateral subclavian              arteries. *See table(s) above for measurements and observations.  Electronically signed by Gerarda Fraction on 04/18/2023 at 4:15:15 PM.    Final    ECHOCARDIOGRAM COMPLETE Result Date: 04/18/2023    ECHOCARDIOGRAM REPORT   Patient Name:   MOTTY BORIN Lindaman Date of Exam: 04/18/2023 Medical Rec #:  [id number]               Height:       71.0 in Accession #:    [id number]              Weight:       95.7 lb Date of Birth:  1950/12/16                BSA:          1.541 m Patient Age:    72 years                BP:           145/91 mmHg Patient Gender: M                       HR:           85 bpm. Exam Location:  Inpatient Procedure: 2D Echo, Cardiac Doppler and Color Doppler (Both Spectral and Color            Flow Doppler were utilized during procedure). Indications:    R55 Syncope  History:        Patient has prior history of Echocardiogram examinations, most                 recent 12/16/2018. Stroke; Risk Factors:Hypertension. ETOH.                 Cancer.  Sonographer:    Sheralyn Boatman RDCS Referring Phys: [id number] Bodee Lafoe  Sonographer Comments: Technically difficult study due to poor echo windows and suboptimal parasternal window. Difficult imaging position for study due to continuous EEG in room. Very thin habitus. Trach history, no SSN images. Parasternal very low, SAX from SUBC. IMPRESSIONS  1. Left ventricular ejection fraction, by estimation, is 55 to 60%. Left ventricular ejection fraction by 2D MOD biplane is 60.0 %. The left ventricle has normal function. The left ventricle has no regional wall motion abnormalities. Left ventricular diastolic parameters are consistent with Grade I diastolic dysfunction (impaired relaxation).  2. Right ventricular systolic function was not well visualized. The right ventricular size is normal. There is normal pulmonary artery systolic pressure. The estimated right ventricular systolic pressure is 26.6 mmHg.  3. The  mitral valve is degenerative. Mild mitral valve regurgitation. No evidence of mitral stenosis.  4. Tricuspid valve regurgitation is mild to moderate.  5. The aortic valve is tricuspid. Aortic valve regurgitation is not visualized. Aortic valve sclerosis/calcification is present, without any evidence of aortic stenosis.  6. The inferior vena cava is normal in size with greater than 50% respiratory variability,  suggesting right atrial pressure of 3 mmHg. Comparison(s): Changes from prior study are noted. 12/16/2018: LVEF 50-55%. FINDINGS  Left Ventricle: Left ventricular ejection fraction, by estimation, is 55 to 60%. Left ventricular ejection fraction by 2D MOD biplane is 60.0 %. The left ventricle has normal function. The left ventricle has no regional wall motion abnormalities. Strain  imaging was not performed. The left ventricular internal cavity size was small. There is no left ventricular hypertrophy. Left ventricular diastolic parameters are consistent with Grade I diastolic dysfunction (impaired relaxation). Indeterminate filling pressures. Right Ventricle: The right ventricular size is normal. Right vetricular wall thickness was not well visualized. Right ventricular systolic function was not well visualized. There is normal pulmonary artery systolic pressure. The tricuspid regurgitant velocity is 2.43 m/s, and with an assumed right atrial pressure of 3 mmHg, the estimated right ventricular systolic pressure is 26.6 mmHg. Left Atrium: Left atrial size was normal in size. Right Atrium: Right atrial size was normal in size. Pericardium: There is no evidence of pericardial effusion. Mitral Valve: The mitral valve is degenerative in appearance. Mild mitral valve regurgitation. No evidence of mitral valve stenosis. Tricuspid Valve: The tricuspid valve is normal in structure. Tricuspid valve regurgitation is mild to moderate. No evidence of tricuspid stenosis. Aortic Valve: The aortic valve is tricuspid. Aortic  valve regurgitation is not visualized. Aortic valve sclerosis/calcification is present, without any evidence of aortic stenosis. Pulmonic Valve: The pulmonic valve was not well visualized. Pulmonic valve regurgitation is not visualized. No evidence of pulmonic stenosis. Aorta: The aortic root is normal in size and structure. Venous: The inferior vena cava is normal in size with greater than 50% respiratory variability, suggesting right atrial pressure of 3 mmHg. IAS/Shunts: No atrial level shunt detected by color flow Doppler. Additional Comments: 3D imaging was not performed.  LEFT VENTRICLE PLAX 2D                        Biplane EF (MOD) LVIDd:         3.60 cm         LV Biplane EF:   Left LVIDs:         2.50 cm                          ventricular LV PW:         0.80 cm                          ejection LV IVS:        1.10 cm                          fraction by LVOT diam:     2.90 cm                          2D MOD LV SV:         87                               biplane is LV SV Index:   57                               60.0 %. LVOT Area:  6.61 cm                                Diastology                                LV e' medial:    5.77 cm/s LV Volumes (MOD)               LV E/e' medial:  10.8 LV vol d, MOD    50.6 ml       LV e' lateral:   5.33 cm/s A2C:                           LV E/e' lateral: 11.7 LV vol d, MOD    35.3 ml A4C: LV vol s, MOD    16.9 ml A2C: LV vol s, MOD    17.6 ml A4C: LV SV MOD A2C:   33.7 ml LV SV MOD A4C:   35.3 ml LV SV MOD BP:    27.6 ml RIGHT VENTRICLE             IVC RV S prime:     15.80 cm/s  IVC diam: 1.30 cm TAPSE (M-mode): 2.7 cm LEFT ATRIUM           Index       RIGHT ATRIUM           Index LA diam:      1.20 cm 0.78 cm/m  RA Area:     10.80 cm LA Vol (A2C): 11.6 ml 7.53 ml/m  RA Volume:   24.90 ml  16.15 ml/m LA Vol (A4C): 4.5 ml  2.91 ml/m  AORTIC VALVE LVOT Vmax:   74.40 cm/s LVOT Vmean:  46.100 cm/s LVOT VTI:    0.132 m  AORTA Ao Root diam: 3.30 cm MITRAL VALVE                TRICUSPID VALVE MV Area (PHT): 4.21 cm    TR Peak grad:   23.6 mmHg MV Decel Time: 180 msec    TR Vmax:        243.00 cm/s MV E velocity: 62.40 cm/s MV A velocity: 62.90 cm/s  SHUNTS MV E/A ratio:  0.99        Systemic VTI:  0.13 m                            Systemic Diam: 2.90 cm Zoila Shutter MD Electronically signed by Zoila Shutter MD Signature Date/Time: 04/18/2023/1:42:11 PM    Final    Overnight EEG with video Result Date: 04/18/2023 Charlsie Quest, MD     04/18/2023  9:31 AM Patient Name: [name] MRN: [id number] Epilepsy Attending: Charlsie Quest Referring Physician/Provider: Erick Blinks, MD Duration: 04/17/2023 8413 to 04/18/2023 2440 Patient history: 73yo M with h/o seizure now with seizure like episodes Level of alertness: Awake, asleep AEDs during EEG study: LEV Technical aspects: This EEG study was done with scalp electrodes positioned according to the 10-20 International system of electrode placement. Electrical activity was reviewed with band pass filter of 1-70Hz , sensitivity of 7 uV/mm, display speed of 57mm/sec with a 60Hz  notched filter applied as appropriate. EEG data were recorded continuously and digitally stored.  Video monitoring was available and reviewed as  appropriate. Description: The posterior dominant rhythm consists of 8 Hz activity of moderate voltage (25-35 uV) seen predominantly in posterior head regions, symmetric and reactive to eye opening and eye closing. Sleep was characterized by vertex waves, sleep spindles (12 to 14 Hz), maximal frontocentral region. Hyperventilation and photic stimulation were not performed.   IMPRESSION: This study is within normal limits. No seizures or epileptiform discharges were seen throughout the recording. A normal interictal EEG does not exclude the diagnosis of epilepsy. Priyanka Annabelle Harman   DG Ankle Complete Right Result Date: 04/16/2023 CLINICAL DATA:  Exposed orthopedic hardware EXAM: RIGHT ANKLE -  COMPLETE 3 VIEW COMPARISON:  None Available. FINDINGS: Osteopenia. Two oblique screws seen along the medial malleolus. There is lateral fixation plate and screws along the distal fibula. There are 2 distal screws crossing to the distal tibia. Minimal surrounding lucency of these crossing screws of less than 2 mm. No underlying bony erosive changes. Osteopenia. Preserved joint spaces. Please correlate for the location of the exposed hardware. The frontal view is slightly overpenetrated. Limit evaluation of the overlapping soft tissues. IMPRESSION: ORIF the distal fibula and medial malleolus. Osteopenia. Please correlate for the exact location of the exposed hardware. No underlying bony erosive changes at this time. If there is further concern of infection, additional workup as clinically appropriate such as white blood cell scan or other cross-sectional imaging Electronically Signed   By: Karen Kays M.D.   On: 04/16/2023 20:15   MR Brain W and Wo Contrast Result Date: 04/16/2023 CLINICAL DATA:  Increased seizure frequency, headaches, decreased functional status, possible left-sided weakness. EXAM: MRI HEAD WITHOUT AND WITH CONTRAST TECHNIQUE: Multiplanar, multiecho pulse sequences of the brain and surrounding structures were obtained without and with intravenous contrast. CONTRAST:  4mL GADAVIST GADOBUTROL 1 MMOL/ML IV SOLN COMPARISON:  Head CT 04/16/2023 and MRI 07/20/2019 FINDINGS: Brain: There is no evidence of an acute infarct, midline shift, or extra-axial fluid collection. 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 T2 hyperintensities in the cerebral white matter bilaterally and in the pons have slightly progressed from the prior MRI and are nonspecific but compatible with mild chronic small vessel ischemic disease. Chronic lacunar infarcts are noted in the left corona radiata and bilateral thalami. There is mild cerebral atrophy.  The hippocampi are symmetric in size and signal. A homogeneously enhancing extra-axial mass along the planum sphenoidale measures 1.9 x 2.2 x 1.2 cm (AP x transverse x craniocaudal) and has mildly enlarged from the 2021 MRI. There is mild local mass effect on the adjacent frontal lobe parenchyma without brain edema. Vascular: Major intracranial arterial flow voids are preserved. Absent flow void in the left transverse and sigmoid sinuses likely reflects slow flow given normal enhancement. Skull and upper cervical spine: Unremarkable bone marrow signal. Sinuses/Orbits: Remote medial right orbital fracture. Clear paranasal sinuses. Trace left mastoid fluid. Other: None. IMPRESSION: 1. No acute intracranial abnormality. 2. Mild chronic small vessel ischemic disease. 3. 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 Electronically Signed   By: Sebastian Ache M.D.   On: 04/16/2023 14:03   CT Head Wo Contrast Result Date: 04/16/2023 CLINICAL DATA:  Head trauma, minor (Age >= 65y). Decreased ambulatory status, h/o seizures but now more frequent with falls. History of head neck cancer. EXAM: CT HEAD WITHOUT CONTRAST TECHNIQUE: Contiguous axial images were obtained from the base of the skull through the vertex without intravenous contrast. RADIATION DOSE REDUCTION: This exam was performed according to  the departmental dose-optimization program which includes automated exposure control, adjustment of the mA and/or kV according to patient size and/or use of iterative reconstruction technique. COMPARISON:  Head CT 03/13/2023 FINDINGS: Brain: There is no evidence of an acute infarct, intracranial hemorrhage, midline shift, or extra-axial fluid collection. There is mild cerebral atrophy. Cerebral white matter hypodensities are unchanged and nonspecific but compatible with mild chronic small vessel ischemic disease. A chronic lacunar infarct is again noted in the left corona radiata. A 1.9 cm partially calcified  extra-axial mass along the planum sphenoidale is unchanged and consistent with a meningioma without associated brain edema. Vascular: Calcified atherosclerosis at the skull base. No hyperdense vessel. Skull: No acute fracture or suspicious osseous lesion. Sinuses/Orbits: Remote medial right orbital fracture. Visualized paranasal sinuses are clear. Trace left mastoid fluid. Other: None. IMPRESSION: 1. No evidence of acute intracranial abnormality. 2. Mild chronic small vessel ischemic disease. 3. Unchanged planum sphenoidale meningioma. Electronically Signed   By: Sebastian Ache M.D.   On: 04/16/2023 12:18    Microbiology: Results for orders placed or performed during the hospital encounter of 04/16/23  Resp panel by RT-PCR (RSV, Flu A&B, Covid) Anterior Nasal Swab     Status: None   Collection Time: 04/16/23 10:50 AM   Specimen: Anterior Nasal Swab  Result Value Ref Range Status   SARS Coronavirus 2 by RT PCR NEGATIVE NEGATIVE Final    Comment: (NOTE) SARS-CoV-2 target nucleic acids are NOT DETECTED.  The SARS-CoV-2 RNA is generally detectable in upper respiratory specimens during the acute phase of infection. The lowest concentration of SARS-CoV-2 viral copies this assay can detect is 138 copies/mL. A negative result does not preclude SARS-Cov-2 infection and should not be used as the sole basis for treatment or other patient management decisions. A negative result may occur with  improper specimen collection/handling, submission of specimen other than nasopharyngeal swab, presence of viral mutation(s) within the areas targeted by this assay, and inadequate number of viral copies(<138 copies/mL). A negative result must be combined with clinical observations, patient history, and epidemiological information. The expected result is Negative.  Fact Sheet for Patients:  BloggerCourse.com  Fact Sheet for Healthcare Providers:   SeriousBroker.it  This test is no t yet approved or cleared by the [place] FDA and  has been authorized for detection and/or diagnosis of SARS-CoV-2 by FDA under an Emergency Use Authorization (EUA). This EUA will remain  in effect (meaning this test can be used) for the duration of the COVID-19 declaration under Section 564(b)(1) of the Act, 21 U.S.C.section 360bbb-3(b)(1), unless the authorization is terminated  or revoked sooner.       Influenza A by PCR NEGATIVE NEGATIVE Final   Influenza B by PCR NEGATIVE NEGATIVE Final    Comment: (NOTE) The Xpert Xpress SARS-CoV-2/FLU/RSV plus assay is intended as an aid in the diagnosis of influenza from Nasopharyngeal swab specimens and should not be used as a sole basis for treatment. Nasal washings and aspirates are unacceptable for Xpert Xpress SARS-CoV-2/FLU/RSV testing.  Fact Sheet for Patients: BloggerCourse.com  Fact Sheet for Healthcare Providers: SeriousBroker.it  This test is not yet approved or cleared by the [place] FDA and has been authorized for detection and/or diagnosis of SARS-CoV-2 by FDA under an Emergency Use Authorization (EUA). This EUA will remain in effect (meaning this test can be used) for the duration of the COVID-19 declaration under Section 564(b)(1) of the Act, 21 U.S.C. section 360bbb-3(b)(1), unless the authorization is terminated or revoked.  Resp Syncytial Virus by PCR NEGATIVE NEGATIVE Final    Comment: (NOTE) Fact Sheet for Patients: BloggerCourse.com  Fact Sheet for Healthcare Providers: SeriousBroker.it  This test is not yet approved or cleared by the [place] FDA and has been authorized for detection and/or diagnosis of SARS-CoV-2 by FDA under an Emergency Use Authorization (EUA). This EUA will remain in effect (meaning this test can be used) for  the duration of the COVID-19 declaration under Section 564(b)(1) of the Act, 21 U.S.C. section 360bbb-3(b)(1), unless the authorization is terminated or revoked.  Performed at [facility], [address], [place], [place] [address]   Urine Culture (for pregnant, neutropenic or urologic patients or patients with an indwelling urinary catheter)     Status: Abnormal   Collection Time: 04/16/23  2:30 PM   Specimen: Urine, Catheterized  Result Value Ref Range Status   Specimen Description   Final    URINE, CATHETERIZED Performed at [facility], [address], [place], [place] [address]    Special Requests   Final    Immunocompromised Performed at [facility], [address] [place], [place] [address]    Culture (A)  Final    >=100,000 COLONIES/mL STREPTOCOCCI, ALPHA HEMOLYTIC Standardized susceptibility testing for this organism is not available. Performed at [facility], [address] [place], [place] [address]    Report Status 04/17/2023 FINAL  Final    Labs: CBC: Recent Labs  Lab 04/16/23 1052 04/17/23 0531 04/18/23 0543 04/19/23 0757  WBC 4.8 4.3 4.8 4.9  NEUTROABS 2.7  --  2.7 2.9  HGB 11.4* 10.0* 9.0* 10.9*  HCT 37.3* 31.6* 28.6* 34.2*  MCV 84.0 82.9 82.2 81.8  PLT 258 211 224 223   Basic Metabolic Panel: Recent Labs  Lab 04/16/23 1052 04/17/23 0531 04/18/23 0543 04/19/23 0757  NA 136 135 132* 131*  K 4.0 4.1 4.2 4.5  CL 100 97* 99 96*  CO2 25 26 27 27   GLUCOSE 93 89 94 102*  BUN 24* 14 9 8   CREATININE 1.28* 1.04 1.09 1.18  CALCIUM 8.6* 8.6* 8.1* 8.4*  MG 2.1  --   --   --    Liver Function Tests: Recent Labs  Lab 04/16/23 1052 04/17/23 0531  AST 17 12*  ALT 6 6  ALKPHOS 79 64  BILITOT 0.8 0.8  PROT 8.8* 6.9  ALBUMIN 3.1* 2.4*   CBG: Recent Labs  Lab 04/18/23 0345 04/18/23 0734 04/18/23 1111 04/18/23 1531 04/19/23 1209  GLUCAP 86 85 115* 91 96    Discharge time  spent: greater than 30 minutes.  Signed: Jonah Blue, MD [facility] 04/19/2023

## 2023-04-19 NOTE — Progress Notes (Signed)
NEUROLOGY CONSULT FOLLOW UP NOTE   Date of service: April 19, 2023 Patient Name: [name] MRN:  [id number] DOB:  06-12-1950  Interval Hx/subjective   No acute events overnight.  Neurological exam remained stable. LTM read negative for seizures.  Vitals   Vitals:   04/18/23 2000 04/18/23 2316 04/19/23 0400 04/19/23 0729  BP: 115/82 (!) 140/107 (!) 158/102 (!) 142/102  Pulse: 91 84 90 89  Resp: 18 18 18 18   Temp: 98.2 F (36.8 C) 98.5 F (36.9 C) 98.1 F (36.7 C) 98.2 F (36.8 C)  TempSrc: Oral Oral Oral Oral  SpO2: 100% 100% 100% 99%  Weight:      Height:         Body mass index is 13.34 kg/m.  Physical Exam   Constitutional: Appears well-developed and well-nourished.  Psych: Affect appropriate to situation. Calm and cooperative.  Eyes: No scleral injection.  HENT: No OP obstrucion.  Head: Normocephalic.  Cardiovascular: Normal rate and regular rhythm.  Respiratory: Effort normal, non-labored breathing.  GI: Soft.  No distension. There is no tenderness.  Skin: WDI.   Neurologic Examination   Neuro: Mental Status: Patient is awake, alert.  Good attention and concentration.  Able to follow commands.  Language/Speech: He is nonverbal at baseline, but does nod yes and no appropriately for conversation. Cranial Nerves: II - XII grossly intact.  Motor: Tone is normal. Bulk is normal. 5/5 strength was present in all four extremities.  Sensory: Sensation is symmetric to light touch in the arms and legs. Cerebellar: No overt ataxia noted. Symmetric fine motor movements.    Medications  Current Facility-Administered Medications:    acetaminophen (TYLENOL) tablet 650 mg, 650 mg, Oral, Q6H PRN **OR** acetaminophen (TYLENOL) suppository 650 mg, 650 mg, Rectal, Q6H PRN, Bobette Mo, MD   aspirin EC tablet 81 mg, 81 mg, Oral, Daily, Bobette Mo, MD, 81 mg at 04/18/23 0841   cefTRIAXone (ROCEPHIN) 1 g in sodium chloride 0.9 % 100 mL  IVPB, 1 g, Intravenous, Q24H, Jonah Blue, MD, Last Rate: 200 mL/hr at 04/18/23 1723, 1 g at 04/18/23 1723   cyanocobalamin (VITAMIN B12) tablet 1,000 mcg, 1,000 mcg, Oral, Daily, Bobette Mo, MD, 1,000 mcg at 04/18/23 0841   dextrose 50 % solution 12.5 g, 12.5 g, Intravenous, PRN, Bobette Mo, MD   dextrose 50 % solution 12.5 g, 12.5 g, Intravenous, Once, Bobette Mo, MD   finasteride (PROSCAR) tablet 5 mg, 5 mg, Oral, Daily, Bobette Mo, MD, 5 mg at 04/18/23 7846   folic acid (FOLVITE) tablet 1 mg, 1 mg, Oral, Daily, Bobette Mo, MD, 1 mg at 04/18/23 9629   hydrALAZINE (APRESOLINE) tablet 25 mg, 25 mg, Oral, Q6H PRN, Opyd, Lavone Neri, MD, 25 mg at 04/16/23 2015   levETIRAcetam (KEPPRA) tablet 500 mg, 500 mg, Oral, BID, Bobette Mo, MD, 500 mg at 04/18/23 2028   levothyroxine (SYNTHROID) tablet 75 mcg, 75 mcg, Oral, QAC breakfast, Bobette Mo, MD, 75 mcg at 04/19/23 0532   ondansetron (ZOFRAN) tablet 4 mg, 4 mg, Oral, Q6H PRN **OR** ondansetron (ZOFRAN) injection 4 mg, 4 mg, Intravenous, Q6H PRN, Bobette Mo, MD   oxyCODONE (Oxy IR/ROXICODONE) immediate release tablet 5 mg, 5 mg, Oral, Q4H PRN, Jonah Blue, MD, 5 mg at 04/18/23 2028   rosuvastatin (CRESTOR) tablet 5 mg, 5 mg, Oral, QHS, Bobette Mo, MD, 5 mg at 04/18/23 2028  Labs and Diagnostic Imaging   CBC:  Recent Labs  Lab 04/16/23 1052 04/17/23 0531 04/18/23 0543  WBC 4.8 4.3 4.8  NEUTROABS 2.7  --  2.7  HGB 11.4* 10.0* 9.0*  HCT 37.3* 31.6* 28.6*  MCV 84.0 82.9 82.2  PLT 258 211 224    Basic Metabolic Panel:  Lab Results  Component Value Date   NA 132 (L) 04/18/2023   K 4.2 04/18/2023   CO2 27 04/18/2023   GLUCOSE 94 04/18/2023   BUN 9 04/18/2023   CREATININE 1.09 04/18/2023   CALCIUM 8.1 (L) 04/18/2023   GFRNONAA >60 04/18/2023   GFRAA >60 07/23/2019   Lipid Panel:  Lab Results  Component Value Date   LDLCALC 68 04/18/2023   HgbA1c:   Lab Results  Component Value Date   HGBA1C 5.5 04/15/2022   Urine Drug Screen:     Component Value Date/Time   LABOPIA NONE DETECTED 05/28/2014 0900   COCAINSCRNUR NONE DETECTED 05/28/2014 0900   COCAINSCRNUR NEGATIVE 09/26/2010 0030   LABBENZ NONE DETECTED 05/28/2014 0900   LABBENZ NEGATIVE 09/26/2010 0030   AMPHETMU NONE DETECTED 05/28/2014 0900   THCU NONE DETECTED 05/28/2014 0900   LABBARB NONE DETECTED 05/28/2014 0900    Alcohol Level     Component Value Date/Time   ETH <10 05/08/2022 0820   INR  Lab Results  Component Value Date   INR 1.3 (H) 04/14/2022   APTT  Lab Results  Component Value Date   APTT 36 04/14/2022   AED levels:  Lab Results  Component Value Date   LEVETIRACETA 22.7 04/16/2023    CT Head without contrast(Personally reviewed) 04/16/2023: 1. No evidence of acute intracranial abnormality. 2. Mild chronic small vessel ischemic disease. 3. Unchanged planum sphenoidale meningioma.   MRI Brain(Personally reviewed): 1. No acute intracranial abnormality. 2. Mild chronic small vessel ischemic disease. 3. 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 No T2 hyper intensities around meningioma.    Continuous EEG: pending  Assessment   73 y.o. male with hx of epilepsy, hypertension, hyperlipidemia, hypothyroidism, history of subarachnoid hemorrhage, alcoholic disorder, liver cirrhosis, urinary tension, CHF, CKD, laryngeal cancer status post laryngectomy who presented for hypotension, seizures, and fall   Orthostatics completed 2/11 show drop in SBP from 146 sitting to 86 standing.  LTM for spell capture. No spells captured, no seizures or epileptiform discharges seen.  Impression: convulsive syncope secondary to orthostatic hypotension  Recommendations   - Discontinue LTM EEG - Continue Keppra - Follow up with outpatient neurology for further testing and medication management  - Continue non-pharmacologic interventions for  orthostatic hypotension  Patient is OK for discharge from neurology standpoint, with recommendations as above. Follow-up with outpatient neurology.    ______________________________________________________________________   Pt seen by Neuro NP/APP and later by MD. Note/plan to be edited by MD as needed.    Signed,   Lynnae January, DNP, AGACNP-BC [facility] Please use AMION for contact information & EPIC for messaging.   NEUROHOSPITALIST ADDENDUM Performed a face to face diagnostic evaluation.   I have reviewed the contents of history and physical exam as documented by PA/ARNP/Resident and agree with above documentation.  I have discussed and formulated the above plan as documented. Edits to the note have been made as needed.  Impression:  Key exam findings: Plan:  Erick Blinks, MD [facility] [phone]   If 7pm to 7am, please call on call as listed on AMION.

## 2023-04-19 NOTE — Progress Notes (Signed)
LTM EEG discontinued - no skin breakdown at [facility].

## 2023-04-19 NOTE — Progress Notes (Addendum)
    Durable Medical Equipment  (From admission, onward)           Start     Ordered   04/19/23 1506  For home use only DME 3 n 1  Once        04/19/23 1505   04/19/23 1505  For home use only DME standard manual wheelchair with seat cushion  Once       Comments: Patient suffers from head and  neck cancer and orthostatic hypotension which impairs their ability to perform daily activities like bathing, dressing, grooming, and toileting in the home.  A walker will not resolve issue with performing activities of daily living. A wheelchair will allow patient to safely perform daily activities. Patient can safely propel the wheelchair in the home or has a caregiver who can provide assistance. Length of need Lifetime. Accessories: elevating leg rests (ELRs), wheel locks, extensions and anti-tippers.   04/19/23 1505            The patient is confined to one level of the home environment and there is no toilet on the that level of the home.

## 2023-04-19 NOTE — Evaluation (Signed)
Physical Therapy Evaluation Patient Details Name: [name] MRN: [id number] DOB: [birth year] Today's Date: 04/19/2023  History of Present Illness  Patient is 73 y.o. male who [facility] on 2/10 from the cancer center with hypotension, seizures, and a fall.  Seizure-like activity could be autonomic dysfunction vs. Convulsive syncope, and neurology requested transfer to [facility]. PMH significant for seizure d/o, HLD, HTN, hypothyroidism, ETOH use d/o with cirrhosis, chronic systolic CHF, and laryngeal cancer s/p laryngectomy.   Clinical Impression  [name] is 73 y.o. male admitted with above HPI and diagnosis. Patient is currently limited by functional impairments below (see PT problem list). Patient lives with daughter  and is mod independent with rollator for mobility at baseline. Patient will benefit from continued skilled PT interventions to address impairments and progress independence with mobility, recommending HHPT at this time. Acute PT will follow and progress as able.       Orthostatic VS for the past 24 hrs:  BP- Lying Pulse- Lying BP- Sitting Pulse- Sitting BP- Standing at 0 minutes Pulse- Standing at 0 minutes  04/19/23 1303 130/87  86 100/63 92 (!) 75/56 96  04/19/23 1311 --  -- 91/79  96 (!) 76/54 (Post gait sitting LE's down) 97  04/19/23 1316 --  -- 110/56 (sitting LE's up) 97 -- --           If plan is discharge home, recommend the following: A little help with walking and/or transfers;A little help with bathing/dressing/bathroom;Assistance with cooking/housework;Assist for transportation;Help with stairs or ramp for entrance   Can travel by private vehicle        Equipment Recommendations None recommended by PT  Recommendations for Other Services       Functional Status Assessment Patient has had a recent decline in their functional status and demonstrates the ability to make significant improvements in function in a reasonable and  predictable amount of time.     Precautions / Restrictions Precautions Precautions: Fall Recall of Precautions/Restrictions: Intact Restrictions Weight Bearing Restrictions Per Provider Order: No      Mobility  Bed Mobility Overal bed mobility: Needs Assistance Bed Mobility: Supine to Sit     Supine to sit: Supervision, HOB elevated, Used rails     General bed mobility comments: use of bed features, sup for safety    Transfers Overall transfer level: Needs assistance Equipment used: Rollator (4 wheels) Transfers: Sit to/from Stand Sit to Stand: Contact guard assist           General transfer comment: close CGA for safety, no overt LOB noted.    Ambulation/Gait Ambulation/Gait assistance: Contact guard assist Gait Distance (Feet): 15 Feet Assistive device: Rollator (4 wheels) Gait Pattern/deviations: Step-through pattern, Decreased stride length Gait velocity: decr     General Gait Details: CGA with light touch assist to guide walker around foot of bed. pt overall steady, cautious pace. no overt LOB noted.  Stairs            Wheelchair Mobility     Tilt Bed    Modified Rankin (Stroke Patients Only)       Balance                                             Pertinent Vitals/Pain Pain Assessment Pain Assessment: No/denies pain    Home Living Family/patient expects to be discharged to:: Private residence Living Arrangements:  Children Available Help at Discharge: Family Type of Home: House Home Access: Stairs to enter Entrance Stairs-Rails: Can reach both Entrance Stairs-Number of Steps: 2-3   Home Layout: Able to live on main level with bedroom/bathroom Home Equipment: Rollator (4 wheels)      Prior Function Prior Level of Function : Independent/Modified Independent             Mobility Comments: uses rollator for mobility at all times ADLs Comments: ind with ADL's, daughter provides transportation      Extremity/Trunk Assessment   Upper Extremity Assessment Upper Extremity Assessment: Defer to OT evaluation    Lower Extremity Assessment Lower Extremity Assessment: Generalized weakness    Cervical / Trunk Assessment Cervical / Trunk Assessment:  (frail)  Communication   Communication Communication: Impaired Factors Affecting Communication: Trach/intubated (chronic trach)    Cognition Arousal: Alert Behavior During Therapy: WFL for tasks assessed/performed   PT - Cognitive impairments: No apparent impairments                         Following commands: Intact       Cueing Cueing Techniques: Verbal cues     General Comments      Exercises     Assessment/Plan    PT Assessment Patient needs continued PT services  PT Problem List Decreased strength;Decreased balance;Decreased activity tolerance;Decreased mobility;Decreased knowledge of precautions;Cardiopulmonary status limiting activity       PT Treatment Interventions DME instruction;Gait training;Stair training;Functional mobility training;Therapeutic activities;Therapeutic exercise;Balance training;Neuromuscular re-education;Cognitive remediation;Patient/family education    PT Goals (Current goals can be found in the Care Plan section)  Acute Rehab PT Goals Patient Stated Goal: recover and get home PT Goal Formulation: With patient Time For Goal Achievement: 05/03/23 Potential to Achieve Goals: Good    Frequency Min 1X/week     Co-evaluation               AM-PAC PT "6 Clicks" Mobility  Outcome Measure Help needed turning from your back to your side while in a flat bed without using bedrails?: A Little Help needed moving from lying on your back to sitting on the side of a flat bed without using bedrails?: A Little Help needed moving to and from a bed to a chair (including a wheelchair)?: A Little Help needed standing up from a chair using your arms (e.g., wheelchair or bedside  chair)?: A Little Help needed to walk in hospital room?: A Little Help needed climbing 3-5 steps with a railing? : A Lot 6 Click Score: 17    End of Session Equipment Utilized During Treatment: Gait belt Activity Tolerance: Patient tolerated treatment well (orthostatic hypotension) Patient left: in chair;with call bell/phone within reach;with chair alarm set Nurse Communication: Mobility status (need ted hose and abdominal binder) PT Visit Diagnosis: Other abnormalities of gait and mobility (R26.89);Muscle weakness (generalized) (M62.81);Difficulty in walking, not elsewhere classified (R26.2);Other symptoms and signs involving the nervous system (R29.898);Dizziness and giddiness (R42)    Time: 1610-9604 PT Time Calculation (min) (ACUTE ONLY): 32 min   Charges:   PT Evaluation $PT Eval Moderate Complexity: 1 Mod PT Treatments $Therapeutic Activity: 8-22 mins PT General Charges $$ ACUTE PT VISIT: 1 Visit         Wynn Maudlin, DPT Acute Rehabilitation Services Office [phone]  04/19/23 1:57 PM

## 2023-04-20 NOTE — Progress Notes (Deleted)
 Palliative Medicine [facility]  Telephone:(336) [phone] [phone]) [phone]   Name: [name] Date: 04/20/2023 MRN: [id number]  DOB: [birth year]  Patient Care Team: Julien Girt, PA-C as PCP - General (Family Medicine) Nestor Ramp, MD as Consulting Physician (Family Medicine) Pickenpack-Cousar, Arty Baumgartner, NP as Nurse Practitioner (Hospice and Palliative Medicine)    INTERVAL HISTORY: [name] is a 73 y.o. 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 Mr.Farro is also status post total laryngectomy in 2011. Palliative ask to see for symptom management and goals of care.   SOCIAL HISTORY:     reports that he quit smoking about 12 years ago. His smoking use included cigarettes. He started smoking about 62 years ago. He has a 10 pack-year smoking history. He quit smokeless tobacco use about 35 years ago.  His smokeless tobacco use included chew. He reports that he does not currently use alcohol after a past usage of about 2.0 standard drinks of alcohol per week. He reports current drug use. Drug: Marijuana.  ADVANCE DIRECTIVES:  None on file  CODE STATUS: Full code  PAST MEDICAL HISTORY: Past Medical History:  Diagnosis Date   Alcohol abuse    Arthritis    Avascular necrosis of hip, right (HCC) 08/09/2016   Carotid artery disease (HCC)    CTO LICA, 40-59% RICA 05/2022 [place]   CHF (congestive heart failure) (HCC)    EF 15-20%, normal coronaries 12/28/11; EF 50-55% 08/2022   Cirrhosis of liver (HCC)    CKD (chronic kidney disease)    Complication of anesthesia    SUCCINYLCHOLINE ADVERSE REACTION (not an allergy) Following 1st attempt to place tracheotomy tube in 2011 at [facility]; patient went  into "succinylcholine induced pulseless electrical activity secondary to probable hyperkalemia" and underwent chest compressions and placement of endotracheal tube with admission to medical ICU.  EXTREMELY HIGH SEVERE REACTION.    Hyperlipidemia    Hypertension    Hypothyroidism    Laryngeal cancer (HCC) 2011   Laryngectomy, T3N0M0 and mouth cancer   Pneumonia    SAH (subarachnoid hemorrhage) (HCC) 07/20/2019   right frontal lobe [facility] 07/20/19   Seizures (HCC)    Urinary retention 05/2022    ALLERGIES:  is allergic to succinylcholine, tylenol [acetaminophen], vicodin [hydrocodone-acetaminophen], other, tamsulosin, and protonix [pantoprazole].  MEDICATIONS:  Current Outpatient Medications  Medication Sig Dispense Refill   aspirin EC 81 MG tablet Take 81 mg by mouth daily. Swallow whole.     cyanocobalamin (VITAMIN B12) 500 MCG tablet Take 2 tablets (1,000 mcg total) by mouth daily. 30 tablet 0   diclofenac Sodium (VOLTAREN) 1 % GEL Apply 2 g topically 2 (two) times daily as needed (pain).     finasteride (PROSCAR) 5 MG tablet Take 1 tablet (5 mg total) by mouth daily. 30 tablet 11   folic acid (FOLVITE) 1 MG tablet Take 1 mg by mouth daily.     levETIRAcetam (KEPPRA) 500 MG tablet Take 1 tablet (500 mg total) by mouth 2 (two) times daily. 180 tablet 3   levothyroxine (SYNTHROID) 75 MCG tablet Take 75 mcg by mouth daily before breakfast.     NEOMYCIN-POLYMYXIN-HYDROCORTISONE (CORTISPORIN) 1 % SOLN OTIC solution Place 4 drops into the left ear 3 (three) times daily as needed (Irritation).  oxyCODONE (OXY IR/ROXICODONE) 5 MG immediate release tablet Take 1 tablet (5 mg total) by mouth every 4 (four) hours as needed for severe pain (pain score 7-10). 15 tablet 0   rosuvastatin (CRESTOR) 5 MG tablet Take 5 mg by mouth at bedtime.     No current facility-administered medications for this visit.    VITAL SIGNS: There were no vitals taken for this visit. There were no vitals filed for this  visit.  Estimated body mass index is 13.34 kg/m as calculated from the following:   Height as of 04/16/23: 5\' 11"  (1.803 m).   Weight as of 04/16/23: 95 lb 10.9 oz (43.4 kg).   PERFORMANCE STATUS (ECOG) : 3 - Symptomatic, >50% confined to bed   Physical Exam General: NAD Cardiovascular: regular rate and rhythm Pulmonary: clear ant fields Abdomen: soft, nontender, + bowel sounds Extremities: no edema, no joint deformities Skin: no rashes Neurological:   IMPRESSION:   We discussed Her current illness and what it means in the larger context of Her on-going co-morbidities. Natural disease trajectory and expectations were discussed.  I discussed the importance of continued conversation with family and their medical providers regarding overall plan of care and treatment options, ensuring decisions are within the context of the patients values and GOCs.  PLAN: Established therapeutic relationship. Education provided on palliative's role in collaboration with their Oncology/Radiation team.  Hypotension Persistent low blood pressure with episodes of high blood pressure. Currently on Florinef for adrenal insufficiency. Blood pressure fluctuates. Midodrine discontinued by endocrinologist and PCP in collaboration per daughter.  -Recommend follow-up with primary care and/or endocrinologist to reassess blood pressure management. -Consider hospital ED work-up for further evaluation and management of persistent hypotension.  Pain Chronic, diffuse pain in the head, jaw, and ear. Limited options for pain management due to low blood pressure and liver cirrhosis. -Consider Tramadol for pain relief, if blood pressure stabilizes.  Seizures Daily seizure activity per daughter. -Continue current seizure management and monitor closely. -ED evaluation.   Malnutrition Significant weight loss and poor oral intake. Previous use of appetite stimulants. -Continue daily protein shakes -Education provided  on importance of focusing on small frequent meals, snacking, and protein enriched foods.  -Expect some component of dehydration. Daughter reports he will go days without eating.   Cancer history History of throat, soft palate, and mouth cancer. Currently in remission. -Continue regular follow-ups with oncology as instructed.   Health Maintenance -ED evaluation due to significant signs of failure to thrive -I will follow-up with patient in 1 week. Sooner if needed for pain/symptom management. Daughter is aware.   Patient expressed understanding and was in agreement with this plan. He also understands that He can call the clinic at any time with any questions, concerns, or complaints.   Any controlled substances utilized were prescribed in the context of palliative care. PDMP has been reviewed.    Visit consisted of counseling and education dealing with the complex and emotionally intense issues of symptom management and palliative care in the setting of serious and potentially life-threatening illness.  Willette Alma, AGPCNP-BC  Palliative Medicine Team/[facility]

## 2023-04-23 ENCOUNTER — Inpatient Hospital Stay: Payer: Medicare Other | Admitting: Nurse Practitioner

## 2023-04-24 ENCOUNTER — Telehealth: Payer: Self-pay | Admitting: Nurse Practitioner

## 2023-04-24 NOTE — Telephone Encounter (Signed)
Patient daughter called in regards to patient No Showed appt. Carollee Herter (daughter) called and states father/patient was in the hospital. Patient admitted on 2/10 from palliative care appt. Appt rescheduled.

## 2023-04-30 NOTE — Progress Notes (Unsigned)
 Palliative Medicine [facility]  Telephone:(336) [phone] Fax:(336) [phone]   Name: [name] Date: 04/30/2023 MRN: [id number]  DOB: 1950-04-24  Patient Care Team: Julien Girt, PA-C as PCP - General (Family Medicine) Nestor Ramp, MD as Consulting Physician (Family Medicine) Pickenpack-Cousar, Arty Baumgartner, NP as Nurse Practitioner (Hospice and Palliative Medicine)    INTERVAL HISTORY: Silvino Selman is a 73 y.o. 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 Mr.Bango is also status post total laryngectomy in 2011. Palliative ask to see for symptom management and goals of care.   SOCIAL HISTORY:     reports that he quit smoking about 12 years ago. His smoking use included cigarettes. He started smoking about 62 years ago. He has a 10 pack-year smoking history. He quit smokeless tobacco use about 35 years ago.  His smokeless tobacco use included chew. He reports that he does not currently use alcohol after a past usage of about 2.0 standard drinks of alcohol per week. He reports current drug use. Drug: Marijuana.  ADVANCE DIRECTIVES:  None on file  CODE STATUS: Full code  PAST MEDICAL HISTORY: Past Medical History:  Diagnosis Date   Alcohol abuse    Arthritis    Avascular necrosis of hip, right (HCC) 08/09/2016   Carotid artery disease (HCC)    CTO LICA, 40-59% RICA 05/2022 [place]   CHF (congestive heart failure) (HCC)    EF 15-20%, normal coronaries 12/28/11; EF 50-55% 08/2022   Cirrhosis of liver (HCC)    CKD (chronic kidney disease)    Complication of anesthesia    SUCCINYLCHOLINE ADVERSE REACTION (not an allergy) Following 1st attempt to place tracheotomy tube in 2011 at [facility]; patient went  into "succinylcholine induced pulseless electrical activity secondary to probable hyperkalemia" and underwent chest compressions and placement of endotracheal tube with admission to medical ICU.  EXTREMELY HIGH SEVERE REACTION.    Hyperlipidemia    Hypertension    Hypothyroidism    Laryngeal cancer (HCC) 2011   Laryngectomy, T3N0M0 and mouth cancer   Pneumonia    SAH (subarachnoid hemorrhage) (HCC) 07/20/2019   right frontal lobe [facility] 07/20/19   Seizures (HCC)    Urinary retention 05/2022    ALLERGIES:  is allergic to succinylcholine, tylenol [acetaminophen], vicodin [hydrocodone-acetaminophen], other, tamsulosin, and protonix [pantoprazole].  MEDICATIONS:  Current Outpatient Medications  Medication Sig Dispense Refill   aspirin EC 81 MG tablet Take 81 mg by mouth daily. Swallow whole.     cyanocobalamin (VITAMIN B12) 500 MCG tablet Take 2 tablets (1,000 mcg total) by mouth daily. 30 tablet 0   diclofenac Sodium (VOLTAREN) 1 % GEL Apply 2 g topically 2 (two) times daily as needed (pain).     finasteride (PROSCAR) 5 MG tablet Take 1 tablet (5 mg total) by mouth daily. 30 tablet 11   folic acid (FOLVITE) 1 MG tablet Take 1 mg by mouth daily.     levETIRAcetam (KEPPRA) 500 MG tablet Take 1 tablet (500 mg total) by mouth 2 (two) times daily. 180 tablet 3   levothyroxine (SYNTHROID) 75 MCG tablet Take 75 mcg by mouth daily before breakfast.     NEOMYCIN-POLYMYXIN-HYDROCORTISONE (CORTISPORIN) 1 % SOLN OTIC solution Place 4 drops into the left ear 3 (three) times daily as needed (Irritation).  oxyCODONE (OXY IR/ROXICODONE) 5 MG immediate release tablet Take 1 tablet (5 mg total) by mouth every 4 (four) hours as needed for severe pain (pain score 7-10). 15 tablet 0   rosuvastatin (CRESTOR) 5 MG tablet Take 5 mg by mouth at bedtime.     No current facility-administered medications for this visit.    VITAL SIGNS: There were no vitals taken for this visit. There were no vitals filed for this  visit.  Estimated body mass index is 13.34 kg/m as calculated from the following:   Height as of 04/16/23: 5\' 11"  (1.803 m).   Weight as of 04/16/23: 95 lb 10.9 oz (43.4 kg).   PERFORMANCE STATUS (ECOG) : 3 - Symptomatic, >50% confined to bed   Physical Exam General: NAD, thin Cardiovascular: regular rate and rhythm Pulmonary: clear ant fields, trach stoma no drainage Abdomen: soft, nontender, + bowel sounds Extremities: no edema, no joint deformities Skin: no rashes Neurological: AAO x4, soft spoken   IMPRESSION:  Mr. [name] is a 73 year old male with chronic hypotension/orthostasis, and chronic cancer related pain who presents with his daughter, Carollee Herter for follow-up. No acute distress identified. States he is doing about the same since last visit, which warranted ED work-up led by several days of hospitalization.   Recently seen by his primary care physician who restarted him on Florinef every other day to manage his low blood pressure, but no significant changes have been observed yet. Previously, Florinef caused his blood pressure to become extremely high during the day, although it would decrease at night. His low blood pressure is affecting his energy levels and appetite. He experiences convulsive episodes when transitioning from lying to standing, attributed to drops in blood pressure rather than seizures. These episodes do not occur when he is lying down or sitting. His blood pressure remains stable when lying. Daughter reports he had a history of high blood pressure prior to his cancer diagnosis and was functional with high blood pressure, managed with several medications. Carollee Herter states blood pressure was much improved during his hospitalization also attributing this to patient's head elevation in the bed versus lying flat. This made a difference in his response when changing positions. She has a wedge pillow at the home however patient has not used. Advised to begin  using the wedge to elevate his head while sleeping or when in the bed to help with the transition from lying to standing.  He experiences persistent pain daily, primarily in the head, neck, and ear on the left side, described as a throbbing and stabbing sensation. He was discharged from the hospital with PRN oxycodone, which he has taken with no adverse concerns with his blood pressure. Daughter also uses liquid Motrin, which provides some relief when pain is mild. I discussed ongoing concern with use of opioids. He was seen by his PCP who identifies patient has chronic orthostasis acknowledging pain management should not be limited if needed.   His seizure disorder is managed with Keppra 500 mg twice daily. Convulsive episodes are linked to orthostatic hypotension rather than seizure activity. His neurologist has not recommended changes to his seizure medication, and he has an upcoming appointment with his neurologist in March.  He is experiencing a lack of appetite and is not eating well, which may be contributing to his headaches. He is consuming protein shakes to supplement his nutrition. His caregiver is considering dietary adjustments to increase his sodium intake to help manage his blood pressure, as he is not eating much  solid food. We discussed appetite stimulant however will defer at this time as patient has upcoming follow-up with Oncology. Education provided on use of protein enriched foods and ways to increase intake. Weight is stable at 96lbs. 104lbs on 12/5.   There is a concern about a possible recurrence of cancer due to a distinctive smell noticed by his caregiver and health changes. He has upcoming oncology appointments scheduled for further evaluation. Patient did undergo some imaging during hospitalization with no obvious abnormalities.   All questions answered and support provided.  I discussed the importance of continued conversation with family and their medical providers  regarding overall plan of care and treatment options, ensuring decisions are within the context of the patients values and GOCs.  Assessment and Plan  Chronic Cancer Related Pain Persistent despite use of PRN oxycodone and liquid Motrin. Complains of headaches, left sided jaw and neck pain. Limited by low blood pressure.  -Continue liquid Motrin as needed for mild to moderate pain. -Start Tramadol 25-50mg  as needed for severe pain, monitor blood pressure closely after administration.  Hypotension Chronic low blood pressure, recently restarted on Florinef and [place]. -Continue Florinef and [place] as prescribed by primary care physician. -Monitor blood pressure closely, especially when administering pain medication.  Poor Appetite Not eating well, possibly contributing to headaches and low blood pressure. -Encourage increased fluid intake, consider drinks with high electrolyte content like Gatorade. Continue protein shakes 2-3 daily.  -Consider Marinol after upcoming oncology appointments if no changes in workup or scans.  Possible Recurrence of Oral Cancer Noted by caregiver due to change in smell, no current mouth pain reported. -Follow up with oncology team.  Seizure Disorder On Keppra 500mg  twice daily, no recent seizure activity reported. -Continue Keppra as prescribed, follow up with neurologist on 05/31/2023.  Follow up in 3-4 weeks to reassess pain management and ongoing support. Patient is requesting ongoing support and visits, but also aware Oncology team can assist with medical management.  Patient expressed understanding and was in agreement with this plan. He also understands that He can call the clinic at any time with any questions, concerns, or complaints.   Any controlled substances utilized were prescribed in the context of palliative care. PDMP has been reviewed.    Visit consisted of counseling and education dealing with the complex and emotionally intense issues  of symptom management and palliative care in the setting of serious and potentially life-threatening illness.  Willette Alma, AGPCNP-BC  Palliative Medicine Team/[facility]

## 2023-05-01 ENCOUNTER — Inpatient Hospital Stay (HOSPITAL_BASED_OUTPATIENT_CLINIC_OR_DEPARTMENT_OTHER): Payer: Medicare Other | Admitting: Nurse Practitioner

## 2023-05-01 ENCOUNTER — Encounter: Payer: Self-pay | Admitting: Nurse Practitioner

## 2023-05-01 VITALS — BP 98/84 | HR 94 | Temp 97.4°F | Resp 18 | Ht 71.0 in | Wt 96.6 lb

## 2023-05-01 DIAGNOSIS — I951 Orthostatic hypotension: Secondary | ICD-10-CM

## 2023-05-01 DIAGNOSIS — G893 Neoplasm related pain (acute) (chronic): Secondary | ICD-10-CM

## 2023-05-01 DIAGNOSIS — R530 Neoplastic (malignant) related fatigue: Secondary | ICD-10-CM

## 2023-05-01 DIAGNOSIS — Z515 Encounter for palliative care: Secondary | ICD-10-CM | POA: Diagnosis not present

## 2023-05-01 DIAGNOSIS — C140 Malignant neoplasm of pharynx, unspecified: Secondary | ICD-10-CM

## 2023-05-01 DIAGNOSIS — R630 Anorexia: Secondary | ICD-10-CM

## 2023-05-01 MED ORDER — TRAMADOL HCL 50 MG PO TABS: 25.0000 mg | ORAL_TABLET | Freq: Two times a day (BID) | ORAL | 0 refills | Status: DC | PRN

## 2023-05-01 MED ORDER — IBUPROFEN 100 MG/5ML PO SUSP: 600.0000 mg | Freq: Three times a day (TID) | ORAL | 3 refills | Status: DC | PRN

## 2023-05-02 NOTE — Progress Notes (Incomplete)
[name] presents to the clinic to review CT scan results. He completed radiation treatment for malignant neoplasm of base of tongue. Treatment was from 11/27/2022 to 11/28/2022.     Pain issues, if any: *** Using a feeding tube?: *** Weight changes, if any: *** Swallowing issues, if any: *** Smoking or chewing tobacco? *** Using fluoride toothpaste daily? *** Last ENT visit was on: *** Other notable issues, if any: ***

## 2023-05-09 ENCOUNTER — Other Ambulatory Visit: Payer: Self-pay | Admitting: Oncology

## 2023-05-09 ENCOUNTER — Inpatient Hospital Stay: Payer: Medicare Other | Attending: Oncology | Admitting: Oncology

## 2023-05-09 ENCOUNTER — Ambulatory Visit (HOSPITAL_BASED_OUTPATIENT_CLINIC_OR_DEPARTMENT_OTHER)
Admission: RE | Admit: 2023-05-09 | Discharge: 2023-05-09 | Disposition: A | Discharge status: Home / Self Care | Payer: Medicare Other | Source: Ambulatory Visit | Attending: Oncology | Admitting: Oncology

## 2023-05-09 VITALS — BP 128/109 | HR 95 | Temp 98.2°F | Resp 18 | Ht 71.0 in | Wt 95.0 lb

## 2023-05-09 DIAGNOSIS — E039 Hypothyroidism, unspecified: Secondary | ICD-10-CM | POA: Insufficient documentation

## 2023-05-09 DIAGNOSIS — G8929 Other chronic pain: Secondary | ICD-10-CM | POA: Insufficient documentation

## 2023-05-09 DIAGNOSIS — R918 Other nonspecific abnormal finding of lung field: Secondary | ICD-10-CM | POA: Insufficient documentation

## 2023-05-09 DIAGNOSIS — C01 Malignant neoplasm of base of tongue: Secondary | ICD-10-CM | POA: Insufficient documentation

## 2023-05-09 DIAGNOSIS — C140 Malignant neoplasm of pharynx, unspecified: Secondary | ICD-10-CM | POA: Diagnosis not present

## 2023-05-09 DIAGNOSIS — K7460 Unspecified cirrhosis of liver: Secondary | ICD-10-CM | POA: Diagnosis not present

## 2023-05-09 DIAGNOSIS — I509 Heart failure, unspecified: Secondary | ICD-10-CM | POA: Insufficient documentation

## 2023-05-09 MED ORDER — FLUCONAZOLE 50 MG PO TABS: 50.0000 mg | ORAL_TABLET | Freq: Every day | ORAL | 1 refills | Status: DC

## 2023-05-09 MED ORDER — NYSTATIN 100000 UNIT/ML MT SUSP: 5.0000 mL | Freq: Two times a day (BID) | OROMUCOSAL | 0 refills | Status: DC

## 2023-05-09 NOTE — Progress Notes (Signed)
**Nathaniel Nathaniel**  Nathaniel Nathaniel   Diagnosis: Head neck cancer  INTERVAL HISTORY:   Nathaniel Nathaniel returns for scheduled visit.  He is here with his daughter.  He was admitted 04/15/2021 with orthostatic hypotension.  He was discharged to home 04/19/2023.  His daughter reports his blood pressure is higher since starting Florinef. He continues to have discomfort at the left side of the face and left ear.  He has not followed up with ENT.  He is being followed by palliative care for pain management.  His daughter is concerned of local cancer progression in the mouth due to foul smell and white discoloration of the tongue. Objective:  Vital signs in last 24 hours:  Blood pressure (!) 128/109, pulse 95, temperature 98.2 F (36.8 C), temperature source Temporal, resp. rate 18, height 5\' 11"  (1.803 m), weight 95 lb (43.1 kg), SpO2 96%.    HEENT: Post surgery and radiation changes over the neck.  White exudate at the tongue and anterior to the left tonsillar fossa.  No discrete masses visualized.  Difficult to visualize the pharynx. Lymphatics: No cervical, supraclavicular, or axillary nodes Resp: Lungs clear bilaterally Cardio: Regular rate and rhythm GI: No hepatosplenomegaly, nontender, no mass Vascular: No leg edema  Lab Results:  Lab Results  Component Value Date   WBC 4.9 04/19/2023   HGB 10.9 (L) 04/19/2023   HCT 34.2 (L) 04/19/2023   MCV 81.8 04/19/2023   PLT 223 04/19/2023   NEUTROABS 2.9 04/19/2023    CMP  Lab Results  Component Value Date   NA 131 (L) 04/19/2023   K 4.5 04/19/2023   CL 96 (L) 04/19/2023   CO2 27 04/19/2023   GLUCOSE 102 (H) 04/19/2023   BUN 8 04/19/2023   CREATININE 1.18 04/19/2023   CALCIUM 8.4 (L) 04/19/2023   PROT 6.9 04/17/2023   ALBUMIN 2.4 (L) 04/17/2023   AST 12 (L) 04/17/2023   ALT 6 04/17/2023   ALKPHOS 64 04/17/2023   BILITOT 0.8 04/17/2023   GFRNONAA >60 04/19/2023   GFRAA >60 07/23/2019    No results found for:  "CEA1", "CEA", "ZOX096", "CA125"  Lab Results  Component Value Date   INR 1.3 (H) 04/14/2022   LABPROT 16.1 (H) 04/14/2022    Imaging:  No results found.  Medications: I have reviewed the patient's current medications.   Assessment/Plan: Squamous cell carcinoma of the base of the tongue stage III (cT3,cN1,cM0), p16 not assessed Base of the tongue mass-invasive moderately differentiated squamous cell carcinoma on biopsy 09/19/2022 09/28/2022-PET,8 mm right upper lobe nodule with mild hypermetabolism, stable compared to 04/14/2022.  Hypermetabolic left tongue base primary, hypermetabolic anterior submental node measuring 1.3 cm, mild hypermetabolism in the mediastinum and bilateral hila favored to be physiologic Quad shot radiation August and September 2024 with improvement in pain CT neck 11/14/2022: Abnormal enhancement at the left aspect of the tongue and oropharynx with irregularity at the surface of the tongue, new from 04/14/2022, abnormal round enhancing level 1A lymph nodes unchanged from 09/28/2022 PET, no new lymphadenopathy Stage III (pT3 N0 M0) squamous cell carcinoma of the supraglottic larynx, 08/10/2009 Total laryngectomy with bilateral neck dissection 09/16/2009 August 2011-October 2011-postoperative radiation therapy alone, 60 Gray in 30 fractions  3.   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  History of heavy alcohol  use 5.  Cirrhosis 6.  History of pancreatitis 7.  CHF 8.  Seizure disorder 9.  Hypothyroidism 10.  Hearing loss 11.  Tracheostomy post total laryngectomy 12.  History of avascular necrosis of the right hip 13.  Frontal subarachnoid hemorrhage 2021 14.  Hypertension/hypotension 15.  Anorexia/malnutrition 16.  Right upper lobe nodule on  PET 09/28/2022-stable from 04/14/2022, mild hypermetabolism CT chest 11/14/2022: Slight increase in size of right upper lobe nodule, potentially related to differences in slice thickness, other small pulmonary nodules are stable 17.  Left-sided face/jaw pain-likely secondary to #1      Disposition: Nathaniel Nathaniel has a history of head and neck cancer.  He has multiple comorbid conditions.  He has lost weight over the past 6 months.  He has chronic pain at the head.  There is no clear clinical evidence of disease progression.  Will make a referral to ENT for a repeat head and neck exam.  He has oral candidiasis today.  We prescribed Diflucan and nystatin rinse.  Nathaniel Nathaniel has multiple lung nodules.  He had a chest CT today.  On my review the dominant right lung nodule appears slightly larger compared to September.  We will wait on the final report and decide on further imaging.  He appears to have a poor performance status with multiple comorbid conditions.  It is unclear whether he will be a candidate for further treatment of head neck cancer if he develops local or systemic progression.  Nathaniel Nathaniel will return for an office visit in 4-5 weeks.  He will continue follow-up with palliative care medicine for pain management.  Thornton Papas, MD  05/09/2023  12:06 PM

## 2023-05-09 NOTE — Progress Notes (Signed)
 Unable to finish rooming process due to provider coming in.

## 2023-05-11 ENCOUNTER — Ambulatory Visit: Payer: Medicare Other | Admitting: Radiation Oncology

## 2023-05-11 ENCOUNTER — Inpatient Hospital Stay: Payer: Medicare Other | Admitting: Dietician
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
## 2023-05-17 ENCOUNTER — Encounter: Payer: Self-pay | Admitting: Vascular Surgery

## 2023-05-17 ENCOUNTER — Ambulatory Visit (HOSPITAL_COMMUNITY): Payer: Medicare Other

## 2023-05-17 ENCOUNTER — Telehealth: Payer: Self-pay | Admitting: Nurse Practitioner

## 2023-05-17 ENCOUNTER — Ambulatory Visit (INDEPENDENT_AMBULATORY_CARE_PROVIDER_SITE_OTHER): Payer: Medicare Other | Admitting: Vascular Surgery

## 2023-05-17 VITALS — BP 65/41 | HR 72 | Temp 98.0°F | Ht 71.0 in

## 2023-05-17 DIAGNOSIS — I6522 Occlusion and stenosis of left carotid artery: Secondary | ICD-10-CM

## 2023-05-17 DIAGNOSIS — I6521 Occlusion and stenosis of right carotid artery: Secondary | ICD-10-CM

## 2023-05-21 NOTE — Progress Notes (Unsigned)
 Palliative Medicine [facility]  Telephone:([phone]) [phone] [phone]   Name: [name] Date: 05/22/2023 MRN: [id number]  DOB: 08-21-1950  Patient Care Team: Nathaniel Girt, PA-C as PCP - General (Family Medicine) Nathaniel Ramp, MD as Consulting Physician (Family Medicine) [name], Nathaniel Baumgartner, NP as Nurse Practitioner (Hospice and Palliative Medicine)    INTERVAL HISTORY: [name] is a 73 y.o. 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 [name] is also status post total laryngectomy in 2011. Palliative ask to see for symptom management and goals of care.   SOCIAL HISTORY:     reports that he quit smoking about 12 years ago. His smoking use included cigarettes. He started smoking about 62 years ago. He has a 10 pack-year smoking history. He quit smokeless tobacco use about 35 years ago.  His smokeless tobacco use included chew. He reports that he does not currently use alcohol after a past usage of about 2.0 standard drinks of alcohol per week. Last used 2023. He reports that he does not currently use drugs after having used the following drugs: Marijuana.Last used 2011.   ADVANCE DIRECTIVES:  None on file  CODE STATUS: Full code  PAST MEDICAL HISTORY: Past Medical History:  Diagnosis Date   Alcohol abuse    Arthritis    Avascular necrosis of hip, right (HCC) 08/09/2016   Carotid artery disease (HCC)    CTO LICA, 40-59% RICA 05/2022 [place]   CHF (congestive heart failure) (HCC)    EF 15-20%, normal coronaries 12/28/11; EF 50-55% 08/2022   Cirrhosis of liver (HCC)    CKD (chronic kidney disease)    Complication of anesthesia    SUCCINYLCHOLINE ADVERSE REACTION (not an allergy)  Following 1st attempt to place tracheotomy tube in 2011 at [facility]; patient went into "succinylcholine induced pulseless electrical activity secondary to probable hyperkalemia" and underwent chest compressions and placement of endotracheal tube with admission to medical ICU.  EXTREMELY HIGH SEVERE REACTION.    Hyperlipidemia    Hypertension    Hypothyroidism    Laryngeal cancer (HCC) 2011   Laryngectomy, T3N0M0 and mouth cancer   Pneumonia    SAH (subarachnoid hemorrhage) (HCC) 07/20/2019   right frontal lobe [facility] 07/20/19   Seizures (HCC)    Urinary retention 05/2022    ALLERGIES:  is allergic to succinylcholine, tylenol [acetaminophen], vicodin [hydrocodone-acetaminophen], other, tamsulosin, and protonix [pantoprazole].  MEDICATIONS:  Current Outpatient Medications  Medication Sig Dispense Refill   aspirin EC 81 MG tablet Take 81 mg by mouth daily. Swallow whole.     cyanocobalamin (VITAMIN B12) 500 MCG tablet Take 2 tablets (1,000 mcg total) by mouth daily. 30 tablet 0   diclofenac Sodium (VOLTAREN) 1 % GEL Apply 2 g topically 2 (two) times daily as needed (pain).     finasteride (PROSCAR) 5 MG tablet Take 1 tablet (5 mg total) by mouth daily. 30 tablet 11   fluconazole (DIFLUCAN) 50 MG tablet Take 1 tablet (50 mg total) by mouth daily. (Patient not taking: Reported on 05/17/2023) 3 tablet 1   fludrocortisone (FLORINEF) 0.1 MG tablet Take 0.1 mg by mouth every other day. 1/2 tablet every other day     folic acid (FOLVITE) 1 MG tablet Take 1 mg by mouth daily.  ibuprofen (ADVIL) 100 MG/5ML suspension Take 30 mLs (600 mg total) by mouth every 8 (eight) hours as needed for mild pain (pain score 1-3) or moderate pain (pain score 4-6). 473 mL 3   levETIRAcetam (KEPPRA) 500 MG tablet Take 1 tablet (500 mg total) by mouth 2 (two) times daily. 180 tablet 3   levothyroxine (SYNTHROID) 75 MCG tablet Take 75 mcg by mouth daily before breakfast.     NEOMYCIN-POLYMYXIN-HYDROCORTISONE  (CORTISPORIN) 1 % SOLN OTIC solution Place 4 drops into the left ear 3 (three) times daily as needed (Irritation).     nystatin (MYCOSTATIN) 100000 UNIT/ML suspension Take 5 mLs (500,000 Units total) by mouth in the morning and at bedtime. Rinse mouth with 5 ml  twice Daily swish and spit for 1 week 60 mL 0   oxyCODONE (OXY IR/ROXICODONE) 5 MG immediate release tablet Take 1 tablet (5 mg total) by mouth every 4 (four) hours as needed for severe pain (pain score 7-10). 15 tablet 0   rosuvastatin (CRESTOR) 5 MG tablet Take 5 mg by mouth at bedtime.     traMADol (ULTRAM) 50 MG tablet Take 0.5-1 tablets (25-50 mg total) by mouth every 12 (twelve) hours as needed. 30 tablet 0   No current facility-administered medications for this visit.    VITAL SIGNS: BP (!) 106/58 (BP Location: Left Arm, Patient Position: Sitting)   Pulse (!) 105   Temp 97.9 F (36.6 C) (Temporal)   Resp 17   Wt 88 lb 1.6 oz (40 kg)   BMI 12.29 kg/m  Filed Weights   05/22/23 1258  Weight: 88 lb 1.6 oz (40 kg)    Estimated body mass index is 12.29 kg/m as calculated from the following:   Height as of 05/17/23: 5\' 11"  (1.803 m).   Weight as of this encounter: 88 lb 1.6 oz (40 kg).   PERFORMANCE STATUS (ECOG) : 3 - Symptomatic, >50% confined to bed   Physical Exam General: NAD, thin Cardiovascular: regular rate and rhythm Pulmonary: clear ant fields, trach stoma no drainage Abdomen: soft, nontender, + bowel sounds Extremities: no edema, no joint deformities Skin: no rashes Neurological: AAO x4, soft spoken   IMPRESSION:  [name] is a 73 year old male with a history of SCC of the tongue and larynx who presents to the clinic with weight loss, lack of appetite, and ongoing signs of failure to thrive. He is accompanied by his daughter. He has a history of cancer affecting his soft palate, throat, and back of the tongue. Recently evaluated by Dr. Truett Perna, no recurrence was found upon examination,  but an ENT follow-up was recommended to ensure no underlying issues. He was prescribed Diflucan and nystatin mouthwash for thrush, which appears to have resolved as he no longer notices the smell that was previously present. Some improvement in his hypotension with use of Florinef and physical activity.   He has experienced significant weight loss, decreasing from 104 pounds in December to 88 pounds currently. He notes a lack of appetite and primarily consumes protein shakes and Jell-O. Previously, he could eat a bowl of cereal or grits with a scrambled egg, but now he is not interested in these foods. No difficulty swallowing or mouth pain. Daughter expresses concern with his lack of appetite and weight loss. We discussed the use of appetite stimulant with hopes of improving his appetite as daughter request. Education provided on use of Marinol including efficacy, administration, and potential side effects.   Mr. Perrot endorses occasional headaches,  which he attributes to hunger due to his lack of eating. He is using Tramadol as needed. Does not require daily. Daughter reports jaw/neck pain is controlled with ibuprofen and tramadol. No adjustments needed at this time.   We will continue to closely monitor and support as needed.  I discussed the importance of continued conversation with family and their medical providers regarding overall plan of care and treatment options, ensuring decisions are within the context of the patients values and GOCs.  Assessment and Plan  Weight loss/Decreased Appetite/Failure to Thrive Significant weight loss from 104 to 88 pounds. Poor appetite, limited intake. Previous thrush resolved. Marinol considered. Education provided.  - Prescribe Marinol 5 mg twice daily. Check availability at [facility] and hospital pharmacy. - Follow up with ENT to rule out abnormalities as directed by Oncology.   Hypotension Blood pressure remains low, improves with movement but drops  with activity. Concerns about medications affecting blood pressure. - Continue current medications for blood pressure management. - Monitor blood pressure and adjust treatment as necessary.  Pain management Pain managed with tramadol and Motrin, with Motrin providing more relief. No significant side effects from tramadol. - Continue tramadol and Motrin as needed.  Cancer of the soft palate, throat, and back of the tongue No current signs of recurrence. ENT follow-up scheduled as a precaution per oncology.  - Follow up with ENT to ensure no recurrence.  Follow-up Follow-up needed to monitor weight, appetite, blood pressure, and overall health status. - Ensure ENT appointment is attended on Thursday as managed by oncology. - Monitor response to Marinol and adjust treatment as necessary.  I will plan to see patient back in 4-6 weeks.  Patient expressed understanding and was in agreement with this plan. He also understands that He can call the clinic at any time with any questions, concerns, or complaints.   Any controlled substances utilized were prescribed in the context of palliative care. PDMP has been reviewed.   Visit consisted of counseling and education dealing with the complex and emotionally intense issues of symptom management and palliative care in the setting of serious and potentially life-threatening illness.  Willette Alma, AGPCNP-BC  Palliative Medicine Team/[facility]

## 2023-05-22 ENCOUNTER — Encounter: Payer: Self-pay | Admitting: Nurse Practitioner

## 2023-05-22 ENCOUNTER — Inpatient Hospital Stay (HOSPITAL_BASED_OUTPATIENT_CLINIC_OR_DEPARTMENT_OTHER): Admitting: Nurse Practitioner

## 2023-05-22 ENCOUNTER — Other Ambulatory Visit (HOSPITAL_COMMUNITY): Payer: Self-pay

## 2023-05-22 VITALS — BP 106/58 | HR 105 | Temp 97.9°F | Resp 17 | Wt 88.1 lb

## 2023-05-22 DIAGNOSIS — R630 Anorexia: Secondary | ICD-10-CM

## 2023-05-22 DIAGNOSIS — C140 Malignant neoplasm of pharynx, unspecified: Secondary | ICD-10-CM

## 2023-05-22 DIAGNOSIS — Z515 Encounter for palliative care: Secondary | ICD-10-CM | POA: Diagnosis not present

## 2023-05-22 DIAGNOSIS — G893 Neoplasm related pain (acute) (chronic): Secondary | ICD-10-CM

## 2023-05-22 DIAGNOSIS — R634 Abnormal weight loss: Secondary | ICD-10-CM | POA: Diagnosis not present

## 2023-05-22 DIAGNOSIS — R530 Neoplastic (malignant) related fatigue: Secondary | ICD-10-CM

## 2023-05-22 DIAGNOSIS — Z7189 Other specified counseling: Secondary | ICD-10-CM

## 2023-05-22 MED ORDER — DRONABINOL 5 MG PO CAPS
5.0000 mg | ORAL_CAPSULE | Freq: Two times a day (BID) | ORAL | 1 refills | Status: DC
Start: 2023-05-22 — End: 2023-05-22

## 2023-05-22 MED ORDER — DRONABINOL 5 MG PO CAPS
5.0000 mg | ORAL_CAPSULE | Freq: Two times a day (BID) | ORAL | 1 refills | Status: DC
Start: 2023-05-22 — End: 2023-09-24
  Filled 2023-05-22: qty 60, 30d supply, fill #0
  Filled 2023-08-04: qty 60, 30d supply, fill #1

## 2023-05-24 ENCOUNTER — Encounter: Payer: Self-pay | Admitting: *Deleted

## 2023-05-24 NOTE — Progress Notes (Signed)
 Call from ENT, Dr. Jenne Pane [phone]) regarding f/u there today. On exam he has recurrence/persistent tumor at base of tongue near left tonsil area. Family thinking more treatment would be planned. MD can call to discuss if necessary. F/U with Dr. Truett Perna currently 06/06/23

## 2023-05-28 ENCOUNTER — Encounter: Payer: Self-pay | Admitting: *Deleted

## 2023-05-28 NOTE — Progress Notes (Signed)
 PDL-1 testing requested on accession number [id number]

## 2023-05-30 LAB — MOLECULAR PATHOLOGY

## 2023-05-31 ENCOUNTER — Ambulatory Visit: Payer: Medicare Other | Admitting: Adult Health

## 2023-05-31 ENCOUNTER — Telehealth: Payer: Self-pay

## 2023-05-31 ENCOUNTER — Encounter: Payer: Self-pay | Admitting: Adult Health

## 2023-05-31 VITALS — BP 85/69 | HR 100 | Ht 71.0 in | Wt 88.0 lb

## 2023-05-31 DIAGNOSIS — I951 Orthostatic hypotension: Secondary | ICD-10-CM

## 2023-05-31 DIAGNOSIS — R55 Syncope and collapse: Secondary | ICD-10-CM | POA: Diagnosis not present

## 2023-05-31 DIAGNOSIS — G40009 Localization-related (focal) (partial) idiopathic epilepsy and epileptic syndromes with seizures of localized onset, not intractable, without status epilepticus: Secondary | ICD-10-CM | POA: Diagnosis not present

## 2023-05-31 MED ORDER — LEVETIRACETAM 100 MG/ML PO SOLN: 500.0000 mg | Freq: Two times a day (BID) | ORAL | 11 refills | Status: DC

## 2023-05-31 NOTE — Telephone Encounter (Signed)
 EEG form completed, placed on NP desk for signature
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
## 2023-05-31 NOTE — Patient Instructions (Addendum)
 Your Plan:  Continue Keppra 500mg  twice daily for seizure prevention  You will be called to schedule a 72 hr ambulatory EEG - you will be called to schedule   Continue to follow with PCP for further blood pressure management such as considering doing 24 hr blood pressure monitoring and possible need of sodium tablets (prior sodium level 131)     Follow up in 6 months or call earlier if needed     Thank you for coming to see [place] at [facility]. I hope we have been able to provide you high quality care today.  You may receive a patient satisfaction survey over the next few weeks. We would appreciate your feedback and comments so that we may continue to improve ourselves and the health of our patients.

## 2023-05-31 NOTE — Telephone Encounter (Signed)
-----   Message from Ihor Austin sent at 05/31/2023  3:00 PM EDT ----- Please start process for 72 hr EEG. Thank you

## 2023-05-31 NOTE — Telephone Encounter (Signed)
 Form signed and faxed to [facility]. Confirmation received.

## 2023-06-06 ENCOUNTER — Inpatient Hospital Stay: Admitting: Oncology

## 2023-06-08 ENCOUNTER — Ambulatory Visit: Admitting: Oncology

## 2023-06-11 ENCOUNTER — Inpatient Hospital Stay: Attending: Oncology | Admitting: Oncology

## 2023-06-11 VITALS — BP 92/80 | HR 92 | Temp 98.2°F | Resp 15 | Ht 71.0 in | Wt 90.5 lb

## 2023-06-11 DIAGNOSIS — G40909 Epilepsy, unspecified, not intractable, without status epilepticus: Secondary | ICD-10-CM | POA: Insufficient documentation

## 2023-06-11 DIAGNOSIS — C01 Malignant neoplasm of base of tongue: Secondary | ICD-10-CM | POA: Insufficient documentation

## 2023-06-11 DIAGNOSIS — E46 Unspecified protein-calorie malnutrition: Secondary | ICD-10-CM | POA: Diagnosis not present

## 2023-06-11 DIAGNOSIS — C140 Malignant neoplasm of pharynx, unspecified: Secondary | ICD-10-CM

## 2023-06-11 DIAGNOSIS — I509 Heart failure, unspecified: Secondary | ICD-10-CM | POA: Diagnosis not present

## 2023-06-11 DIAGNOSIS — E039 Hypothyroidism, unspecified: Secondary | ICD-10-CM | POA: Diagnosis not present

## 2023-06-11 DIAGNOSIS — K7460 Unspecified cirrhosis of liver: Secondary | ICD-10-CM | POA: Insufficient documentation

## 2023-06-11 NOTE — Progress Notes (Signed)
 [facility] OFFICE PROGRESS NOTE   Diagnosis: Head neck cancer  INTERVAL HISTORY:   [name] returns for scheduled visit.  He is here with his daughter.  He saw ENT on 05/24/2023.  Irregular "whitish plaque "was noted at the left base of the tongue.  Dr. Jenne Pane feels this may represent recurrent or residual tumor.  [name] no longer has left ear, face, or mouth pain.  He has pain at the tongue with palpation.  His daughter reports [name] with lidocaine has helped in the past.  He has "syncope convulsions "with movement several times daily.  This is different than his generalized seizures.  He is followed by neurology.  Objective:  Vital signs in last 24 hours:  Blood pressure 92/80, pulse 92, temperature 98.2 F (36.8 C), temperature source Temporal, resp. rate 15, height 5\' 11"  (1.803 m), weight 90 lb 8 oz (41.1 kg), SpO2 98%.    HEENT: White raised lesion at the region of the left vallecula, poor visualization of the base of the tongue Lymphatics: No cervical, supraclavicular, axillary, or inguinal nodes Resp: Distant breath sounds, no respiratory distress Cardio: Regular rate and rhythm GI: No hepatosplenomegaly Vascular: No leg edema Neuro: Alert, follows commands, had an episode of convulsions and brief unresponsiveness after ambulating to the exam table, this episode lasted approximately 30 seconds     Lab Results:  Lab Results  Component Value Date   WBC 4.9 04/19/2023   HGB 10.9 (L) 04/19/2023   HCT 34.2 (L) 04/19/2023   MCV 81.8 04/19/2023   PLT 223 04/19/2023   NEUTROABS 2.9 04/19/2023    CMP  Lab Results  Component Value Date   NA 131 (L) 04/19/2023   K 4.5 04/19/2023   CL 96 (L) 04/19/2023   CO2 27 04/19/2023   GLUCOSE 102 (H) 04/19/2023   BUN 8 04/19/2023   CREATININE 1.18 04/19/2023   CALCIUM 8.4 (L) 04/19/2023   PROT 6.9 04/17/2023   ALBUMIN 2.4 (L) 04/17/2023   AST 12 (L) 04/17/2023   ALT 6 04/17/2023   ALKPHOS 64  04/17/2023   BILITOT 0.8 04/17/2023   GFRNONAA >60 04/19/2023   GFRAA >60 07/23/2019     Medications: I have reviewed the patient's current medications.   Assessment/Plan: Squamous cell carcinoma of the base of the tongue stage III (cT3,cN1,cM0), p16 not assessed Base of the tongue mass-invasive moderately differentiated squamous cell carcinoma on biopsy 09/19/2022-PD-L1 CPS less than 1% 09/28/2022-PET,8 mm right upper lobe nodule with mild hypermetabolism, stable compared to 04/14/2022.  Hypermetabolic left tongue base primary, hypermetabolic anterior submental node measuring 1.3 cm, mild hypermetabolism in the mediastinum and bilateral hila favored to be physiologic Quad shot radiation August and September 2024 with improvement in pain CT neck 11/14/2022: Abnormal enhancement at the left aspect of the tongue and oropharynx with irregularity at the surface of the tongue, new from 04/14/2022, abnormal round enhancing level 1A lymph nodes unchanged from 09/28/2022 PET, no new lymphadenopathy Stage III (pT3 N0 M0) squamous cell carcinoma of the supraglottic larynx, 08/10/2009 Total laryngectomy with bilateral neck dissection 09/16/2009 August 2011-October 2011-postoperative radiation therapy alone, 60 Gray in 30 fractions  3.   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  History of heavy alcohol use 5.  Cirrhosis 6.  History of pancreatitis 7.  CHF 8.  Seizure disorder 9.  Hypothyroidism 10.  Hearing loss 11.  Tracheostomy post total laryngectomy 12.  History of avascular necrosis of the right hip 13.  Frontal subarachnoid hemorrhage 2021 14.  Hypertension/hypotension 15.  Anorexia/malnutrition 16.  Right upper lobe nodule on PET 09/28/2022-stable from 04/14/2022, mild  hypermetabolism CT chest 11/14/2022: Slight increase in size of right upper lobe nodule, potentially related to differences in slice thickness, other small pulmonary nodules are stable CT chest 05/09/2023: Index right upper lobe and right apical nodules are slightly larger, new trace fluid at the posterior lateral right lung base 17.  Left-sided face/jaw pain-likely secondary to #1-improved       Disposition: [name] has a history of locally recurrent head neck cancer.  He underwent palliative radiation in August and September 2024 with clinical improvement.  An ENT exam last month revealed evidence of recurrent tumor at the base of the tongue.  He does not have significant ear, face, or oral pain at present.  I discussed treatment options with [name] and his daughter.  He is not a candidate for chemotherapy or immunotherapy (PD-L1 less than 1%).  He may be a candidate for additional palliative radiation.  We will refer him to Dr. Basilio Cairo to consider the indication for further radiation.  He will continue follow-up with the palliative care medicine team.  [name] has multiple comorbid conditions and a poor performance status.  I discussed hospice care with his daughter.  She understands the poor overall prognosis, but does not wish to pursue a hospice referral at present.  She will return for an office visit in 4 months.  She requested he have a trial of [name] as this appeared to improve his oral intake in the past.  Oral candidiasis responded to Diflucan last month.  Thornton Papas, MD  06/11/2023  3:45 PM

## 2023-06-12 ENCOUNTER — Telehealth: Payer: Self-pay | Admitting: Oncology
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
## 2023-06-12 NOTE — Telephone Encounter (Signed)
 Contacted pt to schedule an appt per 06/11/23 LOS. Unable to reach via phone, voicemail was left.    Follow-up disposition: Return for office.  Check out comments: Office 4 months 30 minutes

## 2023-06-12 NOTE — Progress Notes (Incomplete)
 Head and Neck Cancer Location of Tumor / Histology:  Squamous Cell Carcinoma of the Base of Tongue  Patient presented with symptoms of:  05/09/2023 and 06/11/2023 Dr. Alcide Evener Patient was seen by ENT who noted a whitish plaque on the left base of the tongue. Dr. Jenne Pane feels this may represent recurrent or residual tumor. He has some discomfort on the left side of the face and left ear. Daughter was concerned of local cancer progression in the mouth due to foul smell and white discoloration of the tongue   Biopsies revealed:    Nutrition Status Yes No Comments  Weight changes? []  []    Swallowing concerns? []  []    PEG? []  []     Referrals Yes No Comments  Social Work? []  []    Dentistry? []  []    Swallowing therapy? []  []    Nutrition? []  []    Med/Onc? []  []     Safety Issues Yes No Comments  Prior radiation? []  []    Pacemaker/ICD? []  []    Possible current pregnancy? []  []    Is the patient on methotrexate? []  []     Tobacco/Marijuana/Snuff/ETOH use:  History of heavy alcohol use.  Past/Anticipated interventions by otolaryngology, if any:  Total Laryngectomy with Bilateral Neck Dissection 09/16/2009  Past/Anticipated interventions by medical oncology, if any:  06/11/2023 Truett Perna, MD  Current Complaints / other details:  ***

## 2023-06-13 ENCOUNTER — Other Ambulatory Visit: Payer: Self-pay | Admitting: *Deleted

## 2023-06-13 ENCOUNTER — Ambulatory Visit
Admission: RE | Admit: 2023-06-13 | Discharge: 2023-06-13 | Disposition: A | Discharge status: Home / Self Care | Source: Ambulatory Visit | Attending: Radiation Oncology | Admitting: Radiation Oncology

## 2023-06-13 VITALS — BP 76/66 | HR 90 | Temp 97.6°F | Resp 18 | Ht 71.0 in | Wt 90.0 lb

## 2023-06-13 DIAGNOSIS — Z79899 Other long term (current) drug therapy: Secondary | ICD-10-CM | POA: Diagnosis not present

## 2023-06-13 DIAGNOSIS — I130 Hypertensive heart and chronic kidney disease with heart failure and stage 1 through stage 4 chronic kidney disease, or unspecified chronic kidney disease: Secondary | ICD-10-CM | POA: Insufficient documentation

## 2023-06-13 DIAGNOSIS — E039 Hypothyroidism, unspecified: Secondary | ICD-10-CM | POA: Insufficient documentation

## 2023-06-13 DIAGNOSIS — E785 Hyperlipidemia, unspecified: Secondary | ICD-10-CM | POA: Diagnosis not present

## 2023-06-13 DIAGNOSIS — Z79890 Hormone replacement therapy (postmenopausal): Secondary | ICD-10-CM | POA: Insufficient documentation

## 2023-06-13 DIAGNOSIS — Z9221 Personal history of antineoplastic chemotherapy: Secondary | ICD-10-CM | POA: Insufficient documentation

## 2023-06-13 DIAGNOSIS — Z923 Personal history of irradiation: Secondary | ICD-10-CM | POA: Diagnosis not present

## 2023-06-13 DIAGNOSIS — R918 Other nonspecific abnormal finding of lung field: Secondary | ICD-10-CM | POA: Diagnosis not present

## 2023-06-13 DIAGNOSIS — Z801 Family history of malignant neoplasm of trachea, bronchus and lung: Secondary | ICD-10-CM | POA: Diagnosis not present

## 2023-06-13 DIAGNOSIS — Z87891 Personal history of nicotine dependence: Secondary | ICD-10-CM | POA: Diagnosis not present

## 2023-06-13 DIAGNOSIS — I959 Hypotension, unspecified: Secondary | ICD-10-CM | POA: Diagnosis not present

## 2023-06-13 DIAGNOSIS — Z7982 Long term (current) use of aspirin: Secondary | ICD-10-CM | POA: Diagnosis not present

## 2023-06-13 DIAGNOSIS — C01 Malignant neoplasm of base of tongue: Secondary | ICD-10-CM | POA: Diagnosis present

## 2023-06-13 DIAGNOSIS — I509 Heart failure, unspecified: Secondary | ICD-10-CM | POA: Insufficient documentation

## 2023-06-13 DIAGNOSIS — R1310 Dysphagia, unspecified: Secondary | ICD-10-CM | POA: Insufficient documentation

## 2023-06-13 DIAGNOSIS — N189 Chronic kidney disease, unspecified: Secondary | ICD-10-CM | POA: Diagnosis not present

## 2023-06-13 MED ORDER — MAGIC MOUTHWASH: 5.0000 mL | Freq: Four times a day (QID) | ORAL | 1 refills | Status: DC | PRN

## 2023-06-13 NOTE — Progress Notes (Signed)
 [name] having mouth pain from cancerous lesion. Per Dr. Truett Perna: Send in script for MMW without Lidocaine.

## 2023-06-14 NOTE — Telephone Encounter (Signed)
 Patient has been scheduled for follow-up visit per 06/14/23 LOS.  LVM notifying pt of appt details, provided my direct number to pt if appt changes need to be made.

## 2023-06-18 ENCOUNTER — Inpatient Hospital Stay (HOSPITAL_BASED_OUTPATIENT_CLINIC_OR_DEPARTMENT_OTHER): Admitting: Nurse Practitioner

## 2023-06-18 ENCOUNTER — Encounter: Payer: Self-pay | Admitting: Nurse Practitioner

## 2023-06-18 VITALS — BP 70/58 | HR 96 | Temp 98.0°F | Resp 15 | Ht 71.0 in | Wt 90.0 lb

## 2023-06-18 DIAGNOSIS — Z7189 Other specified counseling: Secondary | ICD-10-CM | POA: Diagnosis not present

## 2023-06-18 DIAGNOSIS — R634 Abnormal weight loss: Secondary | ICD-10-CM

## 2023-06-18 DIAGNOSIS — G893 Neoplasm related pain (acute) (chronic): Secondary | ICD-10-CM

## 2023-06-18 DIAGNOSIS — Z515 Encounter for palliative care: Secondary | ICD-10-CM

## 2023-06-18 DIAGNOSIS — C321 Malignant neoplasm of supraglottis: Secondary | ICD-10-CM

## 2023-06-18 DIAGNOSIS — R630 Anorexia: Secondary | ICD-10-CM

## 2023-06-18 DIAGNOSIS — R530 Neoplastic (malignant) related fatigue: Secondary | ICD-10-CM

## 2023-06-18 NOTE — Patient Instructions (Signed)
 VISIT SUMMARY:  During today's visit, we discussed your ongoing issues with low blood pressure, seizures, appetite loss, and pain management. We also reviewed your current medications and their effectiveness, and we planned the next steps for your care.  YOUR PLAN:  -HYPOTENSION: Hypotension means low blood pressure, which can cause fainting and seizure-like convulsions. We will consult with a cardiologist to adjust your blood pressure management and discuss the potential use of steroids to improve your appetite and energy. You should monitor your blood pressure closely, especially in the morning.  -SEIZURE-LIKE EPISODES: These episodes are likely related to your low blood pressure and can be triggered by movement. We will monitor and document the frequency and triggers of these episodes and work with the cardiologist to address your low blood pressure as a potential cause.  -APPETITE LOSS: Your loss of appetite has been managed with Marinol, which is currently unavailable.  These discuss the use of steroids with the cardiologist if Marinol remains unavailable. Continue consuming protein shakes and soft foods to maintain your nutrition.  -PAIN MANAGEMENT: Your headaches and ear pain are being managed with Motrin, and you should use Tramadol sparingly due to its potential impact on your blood pressure. Continue taking Motrin as needed for pain relief.  -GOALS OF CARE: Our focus is on improving your functional status by managing your blood pressure and increasing your energy, while maintaining your quality of life without aggressive interventions. We will continue with the current management plan and reassess your goals of care based on your condition and preferences.  INSTRUCTIONS:  Please follow up with the cardiologist tomorrow to discuss your blood pressure management and the potential use of steroids. Schedule a follow-up appointment with us  in six weeks, or in three weeks if you start taking  steroids.

## 2023-06-18 NOTE — Progress Notes (Signed)
 Palliative Medicine [facility]  [phone] [phone]   Name: [name] Date: 06/18/2023 MRN: [id number]  DOB: [birth year]  Patient Care Team: Julien Girt, PA-C as PCP - General (Family Medicine) Nestor Ramp, MD as Consulting Physician (Family Medicine) Pickenpack-Cousar, Arty Baumgartner, NP as Nurse Practitioner (Hospice and Palliative Medicine)    INTERVAL HISTORY: [name] is a 73 y.o. 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 Mr.Davisson is also status post total laryngectomy in 2011. Palliative ask to see for symptom management and goals of care.   SOCIAL HISTORY:     reports that he quit smoking about 12 years ago. His smoking use included cigarettes. He started smoking about 62 years ago. He has a 10 pack-year smoking history. He quit smokeless tobacco use about 35 years ago.  His smokeless tobacco use included chew. He reports that he does not currently use alcohol after a past usage of about 2.0 standard drinks of alcohol per week. He reports that he does not currently use drugs after having used the following drugs: Marijuana.  ADVANCE DIRECTIVES:  Documents on file.  Patient has named his daughter Djimon Lundstrom and Vonnie Ligman is his medical decision makers in the event he is unable to speak for himself.  CODE STATUS: Full code  PAST MEDICAL HISTORY: Past Medical History:  Diagnosis Date   Alcohol abuse    Arthritis    Avascular necrosis of hip, right (HCC) 08/09/2016   Carotid artery disease (HCC)    CTO LICA, 40-59% RICA 05/2022 [place]   CHF (congestive heart failure) (HCC)    EF 15-20%, normal coronaries 12/28/11; EF 50-55% 08/2022   Cirrhosis of liver (HCC)    CKD  (chronic kidney disease)    Complication of anesthesia    SUCCINYLCHOLINE ADVERSE REACTION (not an allergy) Following 1st attempt to place tracheotomy tube in 2011 at [facility]; patient went into "succinylcholine induced pulseless electrical activity secondary to probable hyperkalemia" and underwent chest compressions and placement of endotracheal tube with admission to medical ICU.  EXTREMELY HIGH SEVERE REACTION.    Hyperlipidemia    Hypertension    Hypothyroidism    Laryngeal cancer (HCC) 2011   Laryngectomy, T3N0M0 and mouth cancer   Pneumonia    SAH (subarachnoid hemorrhage) (HCC) 07/20/2019   right frontal lobe [facility] 07/20/19   Seizures (HCC)    Urinary retention 05/2022    ALLERGIES:  is allergic to succinylcholine, tylenol [acetaminophen], vicodin [hydrocodone-acetaminophen], other, tamsulosin, and protonix [pantoprazole].  MEDICATIONS:  Current Outpatient Medications  Medication Sig Dispense Refill   aspirin EC 81 MG tablet Take 81 mg by mouth daily. Swallow whole.     cyanocobalamin (VITAMIN B12) 500 MCG tablet Take 2 tablets (1,000 mcg total) by mouth daily. 30 tablet 0   diclofenac Sodium (VOLTAREN) 1 % GEL Apply 2 g topically 2 (two) times daily as needed (pain).     dronabinol (MARINOL) 5 MG capsule Take 1 capsule (5 mg total) by mouth 2 (two) times daily before lunch and supper. 60 capsule 1   finasteride (PROSCAR) 5 MG tablet Take 1 tablet (5 mg total) by mouth daily. 30 tablet 11   fludrocortisone (FLORINEF) 0.1 MG tablet Take 0.1 mg by mouth daily. Takes 1/2 tablet daily  folic acid (FOLVITE) 1 MG tablet Take 1 mg by mouth daily.     ibuprofen (ADVIL) 100 MG/5ML suspension Take 30 mLs (600 mg total) by mouth every 8 (eight) hours as needed for mild pain (pain score 1-3) or moderate pain (pain score 4-6). 473 mL 3   levETIRAcetam (KEPPRA) 100 MG/ML solution Take 5 mLs (500 mg total) by mouth 2 (two) times daily. 300 mL 11   levothyroxine (SYNTHROID) 75 MCG tablet  Take 75 mcg by mouth daily before breakfast.     magic mouthwash SOLN Take 5 mLs by mouth 4 (four) times daily as needed (swish and spit). 240 mL 1   NEOMYCIN-POLYMYXIN-HYDROCORTISONE (CORTISPORIN) 1 % SOLN OTIC solution Place 4 drops into the left ear 3 (three) times daily as needed (Irritation).     rosuvastatin (CRESTOR) 5 MG tablet Take 5 mg by mouth at bedtime.     traMADol (ULTRAM) 50 MG tablet Take 0.5-1 tablets (25-50 mg total) by mouth every 12 (twelve) hours as needed. 30 tablet 0   No current facility-administered medications for this visit.    VITAL SIGNS: BP (!) 70/58 (BP Location: Left Arm, Patient Position: Sitting) Comment: Nurse notified  Pulse 96   Temp 98 F (36.7 C) (Temporal)   Resp 15   Ht 5\' 11"  (1.803 m)   Wt 90 lb (40.8 kg)   SpO2 100%   BMI 12.55 kg/m  Filed Weights   06/18/23 1303  Weight: 90 lb (40.8 kg)    Estimated body mass index is 12.55 kg/m as calculated from the following:   Height as of this encounter: 5\' 11"  (1.803 m).   Weight as of this encounter: 90 lb (40.8 kg).   PERFORMANCE STATUS (ECOG) : 2 - Symptomatic, <50% confined to bed  Physical Exam General: NAD, thin, in wheelchair Cardiovascular: regular rate and rhythm Pulmonary: normal breathing pattern, previous trach scarring Extremities: no edema, no joint deformities Skin: no rashes Neurological: AAO x3  IMPRESSION: Discussed the use of AI scribe software for clinical note transcription with the patient, who gave verbal consent to proceed.  History of Present Illness [name] is a 73 year old male with hypotension and seizures who presents with low energy and blood pressure management.  He is accompanied by his daughter, Cathleen Coach.  Patient in wheelchair.  Hard of hearing and relies on his daughter to engage in discussions on his behalf.  He is experiencing significant issues with history of squamous cell carcinoma of the tongue and ongoing challenges with  hypotension, which has been a persistent problem despite use of Florinef. His blood pressure tends to be lower in the morning and drops further throughout the day, impacting his energy levels and ability to function per daughter.  He has a history of seizures, and during today's visit, he experienced two episodes that resembled either syncope or seizures. These episodes occurred upon arrival and shortly after entering the examination room.  Daughter expresses patient experiences several similar episodes throughout the day specifically with position changes and have been informed by providers that it is related to his orthostasis.  His appetite has been variable, but he is consuming more protein shakes and soft foods. Marinol, which he was taking at 5 mg twice a day to help with appetite, is currently on a nationwide backorder, and there is uncertainty about when it will be available again. He previously had an allergic reaction to Remeron and Mannose was effective for a while but is no longer providing  benefit.  We discussed use of daily low-dose steroids for appetite management.  However given patient's ongoing hypotension I have advised family to further discuss at upcoming cardiology visit.  He experiences headaches and ear pain, although recent examinations showed no ear infections or wax buildup. He has been managing pain primarily with Motrin, as his blood pressure issues limit the use of Tramadol. Motrin is effective.  Goals of care Recent ENT evaluations revealed scar tissue and residue from prior treatments, but no current blockages or issues requiring intervention.  Daughter expressed her understanding after several evaluations with different providers.  They are not seeking any treatment at this time.  She is clear and expressed wishes to continue to treat the treatable allow her her father every opportunity to continue to thrive.  Expresses understanding of recent recommendation for hospice  consideration by oncologist however they are not prepared to consider services at this time.  Daughter states her main priority is managing his symptoms for as long as they can.  She will be open to considering the patient's health further declined or presented as a more terminal state.  I discussed the importance of continued conversation with family and their medical providers regarding overall plan of care and treatment options, ensuring decisions are within the context of the patients values and GOCs. Assessment & Plan Hypotension Severe hypotension causes syncope and seizure-like convulsions, worsens throughout the day. Current management with Florinef; cardiology input needed for adjustments and steroid use. - Consult cardiologist for blood pressure management and steroid contraindications. - Monitor blood pressure closely, especially in the morning. - Consider low-dose steroid therapy for appetite and energy if approved by cardiologist.  Seizure-like episodes Convulsions likely related to syncope from hypotension, triggered by movement and low blood pressure. - Monitor and document seizure-like activity frequency and triggers. - Coordinate with cardiologist to address hypotension as a potential cause.  Appetite loss Managed with Marinol, currently unavailable. Alternatives considered; allergic to Remeron, Mannose ineffective. Steroids considered if Marinol unavailable. - Discuss steroid use with cardiologist if Marinol unavailable. - Continue protein shakes and soft foods for nutrition.  Pain management Headaches and ear pain managed with Motrin; Tramadol used sparingly due to blood pressure impact. - Continue Motrin for pain. - Use Tramadol sparingly due to potential blood pressure effects.  Goals of Care Focus on improving functional status by managing blood pressure and increasing energy, maintaining quality of life without aggressive interventions. - Continue current management  to improve functional status. - Reassess goals of care based on condition and preferences. - Patient and daughter clear in their understanding to treat the treatable allowing every opportunity to continue to thrive.  Education has been provided on outpatient hospice services.  At this time they have declined however will notify medical team when they are in agreement with pursuing support.  Follow-up Cardiology appointment scheduled for blood pressure management. No primary care or endocrinology follow-ups until June. - Follow up with cardiologist tomorrow, communicate findings on blood pressure and steroid use. - Schedule follow-up in six weeks, or three weeks if steroids are initiated.  Patient expressed understanding and was in agreement with this plan. He also understands that He can call the clinic at any time with any questions, concerns, or complaints.   Any controlled substances utilized were prescribed in the context of palliative care. PDMP has been reviewed.   Visit consisted of counseling and education dealing with the complex and emotionally intense issues of symptom management and palliative care in the setting of  serious and potentially life-threatening illness.  Dellia Ferguson, AGPCNP-BC  Palliative Medicine Team/[facility]

## 2023-07-18 ENCOUNTER — Inpatient Hospital Stay: Attending: Oncology | Admitting: Nurse Practitioner

## 2023-08-03 DIAGNOSIS — G40009 Localization-related (focal) (partial) idiopathic epilepsy and epileptic syndromes with seizures of localized onset, not intractable, without status epilepticus: Secondary | ICD-10-CM

## 2023-08-06 ENCOUNTER — Other Ambulatory Visit: Payer: Self-pay

## 2023-08-06 ENCOUNTER — Encounter: Payer: Self-pay | Admitting: Nurse Practitioner

## 2023-08-06 ENCOUNTER — Inpatient Hospital Stay: Attending: Oncology | Admitting: Nurse Practitioner

## 2023-08-06 VITALS — BP 122/94 | HR 77 | Temp 97.6°F | Resp 15 | Wt 94.4 lb

## 2023-08-06 DIAGNOSIS — C321 Malignant neoplasm of supraglottis: Secondary | ICD-10-CM | POA: Diagnosis not present

## 2023-08-06 DIAGNOSIS — I951 Orthostatic hypotension: Secondary | ICD-10-CM

## 2023-08-06 DIAGNOSIS — Z7189 Other specified counseling: Secondary | ICD-10-CM

## 2023-08-06 DIAGNOSIS — Z515 Encounter for palliative care: Secondary | ICD-10-CM | POA: Diagnosis not present

## 2023-08-06 NOTE — Progress Notes (Signed)
 Palliative Medicine [facility]  Telephone:(336) [phone] Fax:(336) [phone]   Name: [name] Date: 08/06/2023 MRN: [id number]  DOB: [birth year]  Patient Care Team: Shepperson, Kirstin, PA-C as PCP - General (Family Medicine) Charise Companion, MD as Consulting Physician (Family Medicine) Pickenpack-Cousar, Giles Labrum, NP as Nurse Practitioner (Hospice and Palliative Medicine)    INTERVAL HISTORY: [name] is a 73 y.o. 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 Mr.Swisher is also status post total laryngectomy in 2011. Palliative ask to see for symptom management and goals of care.   SOCIAL HISTORY:     reports that he quit smoking about 13 years ago. His smoking use included cigarettes. He started smoking about 63 years ago. He has a 10 pack-year smoking history. He quit smokeless tobacco use about 35 years ago.  His smokeless tobacco use included chew. He reports that he does not currently use alcohol  after a past usage of about 2.0 standard drinks of alcohol  per week. He reports that he does not currently use drugs after having used the following drugs: Marijuana.  ADVANCE DIRECTIVES:  Documents on file.  Patient has named his daughter Kayne Yuhas and Kholton Coate is his medical decision makers in the event he is unable to speak for himself.  CODE STATUS: Full code  PAST MEDICAL HISTORY: Past Medical History:  Diagnosis Date   Alcohol  abuse    Arthritis    Avascular necrosis of hip, right (HCC) 08/09/2016   Carotid artery disease (HCC)    CTO LICA, 40-59% RICA 05/2022 [place]    CHF (congestive heart failure) (HCC)    EF 15-20%, normal coronaries 12/28/11; EF 50-55% 08/2022   Cirrhosis of liver (HCC)    CKD  (chronic kidney disease)    Complication of anesthesia    SUCCINYLCHOLINE ADVERSE REACTION (not an allergy) Following 1st attempt to place tracheotomy tube in 2011 at [facility]; patient went into "succinylcholine induced pulseless electrical activity secondary to probable hyperkalemia" and underwent chest compressions and placement of endotracheal tube with admission to medical ICU.  EXTREMELY HIGH SEVERE REACTION.    Hyperlipidemia    Hypertension    Hypothyroidism    Laryngeal cancer (HCC) 2011   Laryngectomy, T3N0M0 and mouth cancer   Pneumonia    SAH (subarachnoid hemorrhage) (HCC) 07/20/2019   right frontal lobe [facility] 07/20/19   Seizures (HCC)    Urinary retention 05/2022    ALLERGIES:  is allergic to succinylcholine, tylenol  [acetaminophen ], vicodin [hydrocodone-acetaminophen ], other, tamsulosin , and protonix  [pantoprazole ].  MEDICATIONS:  Current Outpatient Medications  Medication Sig Dispense Refill   aspirin  EC 81 MG tablet Take 81 mg by mouth daily. Swallow whole.     cyanocobalamin  (VITAMIN B12) 500 MCG tablet Take 2 tablets (1,000 mcg total) by mouth daily. 30 tablet 0   diclofenac  Sodium (VOLTAREN ) 1 % GEL Apply 2 g topically 2 (two) times daily as needed (pain).     dronabinol  (MARINOL ) 5 MG capsule Take 1 capsule (5 mg total) by mouth 2 (two) times daily before lunch and supper. 60 capsule 1   finasteride  (PROSCAR ) 5 MG tablet Take 1 tablet (5 mg total) by mouth daily. 30 tablet 11   fludrocortisone  (FLORINEF ) 0.1 MG tablet Take 0.1 mg by mouth daily. Takes 1/2 tablet daily  folic acid  (FOLVITE ) 1 MG tablet Take 1 mg by mouth daily.     ibuprofen  (ADVIL ) 100 MG/5ML suspension Take 30 mLs (600 mg total) by mouth every 8 (eight) hours as needed for mild pain (pain score 1-3) or moderate pain (pain score 4-6). 473 mL 3   levETIRAcetam  (KEPPRA ) 100 MG/ML solution Take 5 mLs (500 mg total) by mouth 2 (two) times daily. 300 mL 11   levothyroxine  (SYNTHROID ) 75 MCG tablet  Take 75 mcg by mouth daily before breakfast.     magic mouthwash SOLN Take 5 mLs by mouth 4 (four) times daily as needed (swish and spit). 240 mL 1   NEOMYCIN-POLYMYXIN-HYDROCORTISONE  (CORTISPORIN) 1 % SOLN OTIC solution Place 4 drops into the left ear 3 (three) times daily as needed (Irritation).     rosuvastatin  (CRESTOR ) 5 MG tablet Take 5 mg by mouth at bedtime.     traMADol  (ULTRAM ) 50 MG tablet Take 0.5-1 tablets (25-50 mg total) by mouth every 12 (twelve) hours as needed. 30 tablet 0   No current facility-administered medications for this visit.    VITAL SIGNS: BP (!) 122/94 (BP Location: Left Arm, Patient Position: Sitting)   Pulse 77   Temp 97.6 F (36.4 C) (Temporal)   Resp 15   Wt 94 lb 6.4 oz (42.8 kg)   SpO2 100%   BMI 13.17 kg/m  Filed Weights   08/06/23 1303  Weight: 94 lb 6.4 oz (42.8 kg)    Estimated body mass index is 13.17 kg/m as calculated from the following:   Height as of 06/18/23: 5\' 11"  (1.803 m).   Weight as of this encounter: 94 lb 6.4 oz (42.8 kg).   PERFORMANCE STATUS (ECOG) : 2 - Symptomatic, <50% confined to bed  Physical Exam General: NAD, thin, in wheelchair Cardiovascular: regular rate and rhythm Pulmonary: normal breathing pattern, previous trach scarring Extremities: no edema, no joint deformities Skin: no rashes Neurological: AAO x3  IMPRESSION: Discussed the use of AI scribe software for clinical note transcription with the patient, who gave verbal consent to proceed.  History of Present Illness [name] is a 73 year old male with hypotension and seizures who presents to clinic for follow-up. He is accompanied by his daughter, Cathleen Coach and his sister who is visiting from [place] .  Patient in wheelchair.  Hard of hearing and relies on his daughter to engage in discussions on his behalf.  He has been experiencing episodes of syncope with convulsions, associated with significant drops in blood pressure. These episodes have  not required hospitalization since his last visit. He has been taking dexamethasone  three times a day, which has improved his condition. Since starting the medication, his blood pressure has been high in the mornings, reaching up to 180/110, but normalizes to 120/68 after activity. He has been more functional and has only had one seizure in the past month, with no recent syncope episodes.  He has been working on gaining weight, increasing from 80 pounds to 94 pounds. Patient and family much appreciative of this. He expresses hopes he will continue to gain weight.  He consumes protein shakes to aid in weight gain and eats breakfast daily along with snacks throughout the day. His mouth was previously sore, but he can now eat and chew without issues. Daughter shares Oncology team noted scar tissue in his mouth from previous treatments but no new growths.  He takes fluorocortisone, a whole pill one day and half a pill the next, as well as Motrin   for pain management. His blood pressure still drops upon standing but stabilizes after a few minutes. He is participating in therapy and physical therapy once a week, sometimes twice depending on the schedule.  All questions answered and support provided.   Goals of care 05/22/23: Recent ENT evaluations revealed scar tissue and residue from prior treatments, but no current blockages or issues requiring intervention.  Daughter expressed her understanding after several evaluations with different providers.  They are not seeking any treatment at this time.  She is clear and expressed wishes to continue to treat the treatable allow her her father every opportunity to continue to thrive.  Expresses understanding of recent recommendation for hospice consideration by oncologist however they are not prepared to consider services at this time.  Daughter states her main priority is managing his symptoms for as long as they can.  She will be open to considering the patient's health  further declined or presented as a more terminal state.  I discussed the importance of continued conversation with family and their medical providers regarding overall plan of care and treatment options, ensuring decisions are within the context of the patients values and GOCs. Assessment & Plan Syncope with convulsions Episodes decreased, one seizure in past month. Blood pressure fluctuates, stabilizes with movement. Medication regimen improved symptoms. - Continue dexamethasone  and fludrocortisone . - Monitor blood pressure closely, especially during activity. - Continue therapy sessions to improve mobility and monitor for syncope.  Seizures Seizure activity decreased, one episode in past month. Improved control with current medication. - Ensure adherence to current medication regimen.  Hypertension Elevated morning blood pressure decreases with activity. Managed with current medications. - Monitor blood pressure regularly, especially during activity. - Continue current medication regimen and adjust as necessary based on readings per Cardiology and PCP team.   Cancer No new growths or significant changes. Residual scar tissue present. Mouth soreness improved. - Continue regular oncology follow-ups. - Maintain current management without additional radiation.  Seizure-like episodes Convulsions likely related to syncope from hypotension, triggered by movement and low blood pressure. - Monitor and document seizure-like activity frequency and triggers. - Coordinate with cardiologist to address hypotension as a potential cause.  Pain management Headaches and ear pain managed with Motrin ; Tramadol  used sparingly due to blood pressure impact. - Continue Motrin  for pain. - Use Tramadol  sparingly due to potential blood pressure effects.  Goals of Care Focus on improving functional status by managing blood pressure and increasing energy, maintaining quality of life without aggressive  interventions. Emphasis on weight gain and nutrition. Family involved in care decisions. - Encourage nutritional intake and weight gain. - Maintain open communication with family regarding care decisions and goals. - Continue current management to improve functional status. - Reassess goals of care based on condition and preferences. - Patient and daughter clear in their understanding to treat the treatable allowing every opportunity to continue to thrive.  Education has been provided on outpatient hospice services.  At this time they have declined however will notify medical team when they are in agreement with pursuing support.  Follow-up Cardiology appointment scheduled for blood pressure management. No primary care or endocrinology follow-ups until June. - Follow up with cardiologist tomorrow, communicate findings on blood pressure and steroid use. - Schedule follow-up in six weeks, or three weeks if steroids are initiated.  Patient expressed understanding and was in agreement with this plan. He also understands that He can call the clinic at any time with any questions, concerns, or complaints.  Any controlled substances utilized were prescribed in the context of palliative care. PDMP has been reviewed.   Visit consisted of counseling and education dealing with the complex and emotionally intense issues of symptom management and palliative care in the setting of serious and potentially life-threatening illness.  Dellia Ferguson, AGPCNP-BC  Palliative Medicine Team/[facility]

## 2023-08-13 ENCOUNTER — Encounter (INDEPENDENT_AMBULATORY_CARE_PROVIDER_SITE_OTHER): Payer: Self-pay | Admitting: Neurology

## 2023-08-13 DIAGNOSIS — G40009 Localization-related (focal) (partial) idiopathic epilepsy and epileptic syndromes with seizures of localized onset, not intractable, without status epilepticus: Secondary | ICD-10-CM

## 2023-08-13 DIAGNOSIS — I951 Orthostatic hypotension: Secondary | ICD-10-CM

## 2023-08-13 NOTE — Procedures (Signed)
 Clinical History:  This is a 73 y/o M who presents for follow up seizure activity in the setting of automatic dysfunction vs convulsive syncope as episodes primarily occur with position changes.   Study Start Date/Time:  07/31/2023 1255 Study End Date/Time:  08/03/2023 0902    INTERMITTENT MONITORING with VIDEO TECHNICAL SUMMARY:  This AVEEG was performed using equipment provided by [employer] utilizing Bluetooth ( Trackit ) amplifiers with continuous EEGT attended video collection using encrypted remote transmission via [employer] secured cellular tower network with data rates for each AVEEG performed. This is a Therapist, music AVEEG, obtained, according to the 10-20 international electrode placement system, reformatted digitally into referential and bipolar montages. Data was acquired with a minimum of 21 bipolar connections and sampled at a minimum rate of 250 cycles per second per channel, maximum rate of 450 cycles per second per channel and two channels for EKG. The entire VEEG study was recorded through cable and or radio telemetry for subsequent analysis. Specified epochs of the AVEEG data were identified at the direction of the subject by the depression of a push button by the patient. Each patients event file included data acquired two minutes prior to the push button activation and continuing until two minutes afterwards. AVEEG files were reviewed on [employer] server, Licensed Software provided by [employer] with a digital high frequency filter set at 70 Hz and a low frequency filter set at 1 Hz with a paper speed of 48mm/s resulting in 10 seconds per digital page. This entire AVEEG was reviewed by the EEG Technologist. Random time samples, random sleep samples, clips, patient initiated push button files with included patient daily diary logs, EEG Technologist pruned data was reviewed and verified for accuracy and validity by the governing reading neurologist in full  details. This AEEGV was fully compliant with all requirements for CPT 97500 for setup, patient education, take down and administered by an EEG technologist.   Long-Term EEG with Video was monitored intermittently by a qualified EEG technologist for the entirety of the recording; quality check-ins were performed at a minimum of every two hours, checking and documenting real-time data and video to assure the integrity and quality of the recording (e.g., camera position, electrode integrity and impedance), and identify the need for maintenance. For intermittent monitoring, an EEG Technologist monitored no more than 12 patients concurrently. Diagnostic video was captured at least 80% of the time during the recording.   PATIENT EVENTS:  A button press or notation was not made during this study.   TECHNOLOGIST EVENTS:  No clear epileptiform activity was detected by the reviewing neurodiagnostic technologist during the recording for further evaluation.   TIME SAMPLES:  10-minutes of every two hours recorded are reviewed as random time samples.   SLEEP SAMPLES:  5-minutes of every 24 hour recorded sleep cycle are reviewed as random sleep samples.   AWAKE:  At maximal level of alertness, the posterior dominant background activity was continuous, reactive, low voltage rhythm of 9 Hz. This was symmetric, well-modulated, and attenuated with eye opening. Diffuse, symmetric, frontocentral beta range activity was present.   SLEEP:  N1 Sleep (Stage 1) was observed and characterized by the disappearance of alpha rhythm and the appearance of vertex activity.  N2 Sleep (Stage 2) was observed and characterized by vertex waves, K-complexes, and sleep spindles.   N3 (Stage 3) sleep was observed and characterized by high amplitude Delta activity of 20%.   REM sleep was observed.   EKG:  There  were no arrhythmias or abnormalities noted during this recording.   Impression:  Normal EEG: awake and asleep    Clinical Correlation:  This is a normal 3-day ambulatory EEG tracing. No focal abnormalities or epileptiform discharges were seen. There were no electrographic seizures noted. No events were captured during the recording. Please note a normal EEG does not exclude the diagnosis of epilepsy.   Teara Duerksen, MD [facility]

## 2023-08-14 ENCOUNTER — Ambulatory Visit: Payer: Self-pay | Admitting: Adult Health

## 2023-08-14 NOTE — Telephone Encounter (Signed)
-----   Message from Johny Nap sent at 08/14/2023  7:34 AM EDT ----- Please contact daughter and advise 72-hour EEG did not show evidence of seizure activity.  He was started on dexamethasone  for appetite stimulant by cardiology and felt this may also help hypotension, per recent palliative care note from 08/06/2023, blood pressures improved with only 1 syncopal event in the past month, previously occurring daily.  This makes these episodes less suspicious for seizures and more likely blood pressure related.  Recommend he continue on Keppra  at current dosage.  No further changes or workup required from neurological standpoint. Thank you.

## 2023-08-14 NOTE — Telephone Encounter (Signed)
 I called patient's daughter, Cathleen Coach, per DPR. No answer, left a message asking her to call us  back. Please send to POD 3 when she calls back.

## 2023-08-15 NOTE — Telephone Encounter (Signed)
 Pt's daughter called in and I was able to review the results with her. Pt verbalized understanding. Pt had no questions at this time but was encouraged to call back if questions arise.

## 2023-08-28 ENCOUNTER — Telehealth: Payer: Self-pay | Admitting: *Deleted

## 2023-08-28 NOTE — Telephone Encounter (Signed)
 Daughter called to report over the last several days he has developed significant pain left side of face/mouth. Also painful swallowing and trouble swallowing solids. Doing OK with liquids. Tramadol  not helping his pain very much any longer. Did give him one dose of oxycodone  he had from past and this helped some, but it makes his BP drop. His face looks puffy, but he is also on dexamethaxone 0.75 mg tid to help appetite and BP stay higher. Asking if Dr. Cloretta could see him?

## 2023-08-29 NOTE — Telephone Encounter (Signed)
 Informed daughter that Dr. Cloretta could only offer chemotherapy if that is want they wish. She wants him to look in his throat-informed her that we do not have the ability to look far in his throat-suggested they contact his ENT for this exam. Dr. Cloretta offers to contact Dr. Izell to see if further RT is possible since he is symptomatic again and she agrees to this. Sent message to palliative NP as well regarding uncontrolled pain.

## 2023-08-30 ENCOUNTER — Telehealth: Payer: Self-pay

## 2023-08-30 NOTE — Telephone Encounter (Signed)
 Received message from Dr.Sherill's office reporting pt having increased pain. Attempted to call daughter, only number listed under contacts. No answer, LVM.

## 2023-09-03 ENCOUNTER — Telehealth: Payer: Self-pay | Admitting: Radiation Oncology

## 2023-09-03 ENCOUNTER — Other Ambulatory Visit: Payer: Self-pay

## 2023-09-03 ENCOUNTER — Telehealth: Payer: Self-pay

## 2023-09-03 DIAGNOSIS — C01 Malignant neoplasm of base of tongue: Secondary | ICD-10-CM

## 2023-09-03 NOTE — Telephone Encounter (Signed)
 I attempted to contact the patient's daughter but was unable to reach her. I left a message to returned  a phone call to the office to discuss the patient and planned to inform her that Dr. Izell will order a PET scan, after which we will review and discuss potential radiation treatment options.

## 2023-09-03 NOTE — Progress Notes (Signed)
 Oncology Nurse Navigator Documentation   Mr. Pretty was referred back to Dr. Izell to consider further radiation for his head and neck cancer. Dr. Izell wants a PET scan completed before she sees him for reconsult. I have scheduled his PET for 7/21 (date/time requested by Mr. Leser daughter, Clotilda). She has been made aware and his agreeable to the appointment. She knows that she will receive a call from our radiation schedulers for an appointment with Dr. Izell and CT simulation same day after 7/21. She knows to call me if she has any questions or concerns at anytime.   Delon Jefferson RN, BSN, OCN Head & Neck Oncology Nurse Navigator [facility] Phone # [phone]  Fax # [phone]

## 2023-09-03 NOTE — Telephone Encounter (Signed)
 LVM to schedule FUN/ SIM with pt's daughter

## 2023-09-10 ENCOUNTER — Encounter: Payer: Self-pay | Admitting: Nurse Practitioner

## 2023-09-10 ENCOUNTER — Inpatient Hospital Stay: Attending: Oncology | Admitting: Nurse Practitioner

## 2023-09-10 VITALS — BP 121/85 | HR 71 | Temp 98.5°F | Resp 16

## 2023-09-10 DIAGNOSIS — G893 Neoplasm related pain (acute) (chronic): Secondary | ICD-10-CM

## 2023-09-10 DIAGNOSIS — C140 Malignant neoplasm of pharynx, unspecified: Secondary | ICD-10-CM

## 2023-09-10 DIAGNOSIS — R530 Neoplastic (malignant) related fatigue: Secondary | ICD-10-CM | POA: Diagnosis not present

## 2023-09-10 DIAGNOSIS — Z515 Encounter for palliative care: Secondary | ICD-10-CM

## 2023-09-10 DIAGNOSIS — Z7189 Other specified counseling: Secondary | ICD-10-CM

## 2023-09-10 MED ORDER — OXYCODONE HCL 5 MG/5ML PO SOLN: 2.5000 mg | Freq: Four times a day (QID) | ORAL | 0 refills | Status: DC | PRN

## 2023-09-10 NOTE — Progress Notes (Signed)
 Palliative Medicine [facility]  [phone] [phone]) [phone]   Name: [name] Date: 09/10/2023 MRN: [id number]  DOB: 09-16-1950  Patient Care Team: Shepperson, Kirstin, PA-C as PCP - General (Family Medicine) Rosalynn Camie CROME, MD as Consulting Physician (Family Medicine) Pickenpack-Cousar, Fannie SAILOR, NP as Nurse Practitioner (Hospice and Palliative Medicine)    INTERVAL HISTORY: [name] is a 73 y.o. 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 [name] is also status post total laryngectomy in 2011. Palliative ask to see for symptom management and goals of care.   SOCIAL HISTORY:     reports that he quit smoking about 13 years ago. His smoking use included cigarettes. He started smoking about 63 years ago. He has a 10 pack-year smoking history. He quit smokeless tobacco use about 35 years ago.  His smokeless tobacco use included chew. He reports that he does not currently use alcohol  after a past usage of about 2.0 standard drinks of alcohol  per week. He reports that he does not currently use drugs after having used the following drugs: Marijuana.  ADVANCE DIRECTIVES:  Documents on file.  Patient has named his daughter Percell Lamboy and Juelz Whittenberg is his medical decision makers in the event he is unable to speak for himself.  CODE STATUS: Full code  PAST MEDICAL HISTORY: Past Medical History:  Diagnosis Date   Alcohol  abuse    Arthritis    Avascular necrosis of hip, right (HCC) 08/09/2016   Carotid artery disease (HCC)    CTO LICA, 40-59% RICA 05/2022 US    CHF (congestive heart failure) (HCC)    EF 15-20%, normal coronaries 12/28/11; EF 50-55% 08/2022   Cirrhosis of liver (HCC)    CKD  (chronic kidney disease)    Complication of anesthesia    SUCCINYLCHOLINE ADVERSE REACTION (not an allergy) Following 1st attempt to place tracheotomy tube in 2011 at [facility]; patient went into succinylcholine induced pulseless electrical activity secondary to probable hyperkalemia and underwent chest compressions and placement of endotracheal tube with admission to medical ICU.  EXTREMELY HIGH SEVERE REACTION.    Hyperlipidemia    Hypertension    Hypothyroidism    Laryngeal cancer (HCC) 2011   Laryngectomy, T3N0M0 and mouth cancer   Pneumonia    SAH (subarachnoid hemorrhage) (HCC) 07/20/2019   right frontal lobe [facility] 07/20/19   Seizures (HCC)    Urinary retention 05/2022    ALLERGIES:  is allergic to succinylcholine, tylenol  [acetaminophen ], vicodin [hydrocodone-acetaminophen ], other, tamsulosin , and protonix  [pantoprazole ].  MEDICATIONS:  Current Outpatient Medications  Medication Sig Dispense Refill   oxyCODONE  (ROXICODONE ) 5 MG/5ML solution Take 2.5-5 mLs (2.5-5 mg total) by mouth every 6 (six) hours as needed for severe pain (pain score 7-10). 100 mL 0   aspirin  EC 81 MG tablet Take 81 mg by mouth daily. Swallow whole.     cyanocobalamin  (VITAMIN B12) 500 MCG tablet Take 2 tablets (1,000 mcg total) by mouth daily. 30 tablet 0   dexamethasone  (DECADRON ) 0.75 MG tablet Take 0.75 mg by mouth 3 (three) times daily.     diclofenac  Sodium (VOLTAREN ) 1 % GEL Apply 2 g topically 2 (two) times daily as needed (pain).     dronabinol  (MARINOL ) 5 MG capsule Take 1 capsule (5 mg total) by mouth 2 (  two) times daily before lunch and supper. 60 capsule 1   finasteride  (PROSCAR ) 5 MG tablet Take 1 tablet (5 mg total) by mouth daily. 30 tablet 11   fludrocortisone  (FLORINEF ) 0.1 MG tablet Take 0.1 mg by mouth daily. Takes 1/2 tablet daily     folic acid  (FOLVITE ) 1 MG tablet Take 1 mg by mouth daily.     ibuprofen  (ADVIL ) 100 MG/5ML suspension Take 30 mLs (600 mg total) by mouth every 8  (eight) hours as needed for mild pain (pain score 1-3) or moderate pain (pain score 4-6). 473 mL 3   levETIRAcetam  (KEPPRA ) 100 MG/ML solution Take 5 mLs (500 mg total) by mouth 2 (two) times daily. 300 mL 11   levothyroxine  (SYNTHROID ) 75 MCG tablet Take 75 mcg by mouth daily before breakfast.     magic mouthwash SOLN Take 5 mLs by mouth 4 (four) times daily as needed (swish and spit). 240 mL 1   NEOMYCIN-POLYMYXIN-HYDROCORTISONE  (CORTISPORIN) 1 % SOLN OTIC solution Place 4 drops into the left ear 3 (three) times daily as needed (Irritation).     rosuvastatin  (CRESTOR ) 5 MG tablet Take 5 mg by mouth at bedtime.     No current facility-administered medications for this visit.    VITAL SIGNS: BP 121/85 (BP Location: Left Arm, Patient Position: Sitting)   Pulse 71   Temp 98.5 F (36.9 C) (Oral)   Resp 16   SpO2 100%  There were no vitals filed for this visit.   Estimated body mass index is 13.17 kg/m as calculated from the following:   Height as of 06/18/23: 5' 11 (1.803 m).   Weight as of 08/06/23: 94 lb 6.4 oz (42.8 kg).   PERFORMANCE STATUS (ECOG) : 2 - Symptomatic, <50% confined to bed  Physical Exam General: NAD, thin, in wheelchair Cardiovascular: regular rate and rhythm Pulmonary: normal breathing pattern, previous trach scarring Extremities: no edema, no joint deformities Skin: no rashes, right facial tenderness and redness Neurological: AAO x3  IMPRESSION: Discussed the use of AI scribe software for clinical note transcription with the patient, who gave verbal consent to proceed.  History of Present Illness [name] is a 73 year old male with history of head and neck cancer who presents with increased pain and difficulty swallowing. He is accompanied by his daughter and ex-wife. Patient in wheelchair. Guarding left side of face.   There is puffiness and redness in his jaw, but he reports no pain or discomfort from it. He is scheduled for a PET scan on July  21st followed by an appointment with Dr. Izell (Rad Onc) on July 25th to review results and consider additional radiation treatments.   He experiences significant pain, primarily on the left side of his head, described as 'throbbing'. The pain is severe enough to cause tears and disrupt sleep. He has consistent headaches and earaches. Oxycodone  is used for pain management but causes significant drops in blood pressure, taking a day and a half to recover from a single dose. Ibuprofen  is also used and reported as effective, but he is not eating, complicating medication administration. Tramadol  is ineffective for the current pain but helps with stiffness. We discussed his pain management overall. No adjustments to regimen. Daughter to continue administering based on pain level while closely monitoring vital signs.   He has difficulty swallowing, stating he 'can't swallow the food', leading to a refusal to eat solid foods. He is consuming shakes, ice cream, and coffee but is beginning to refuse  shakes as well. His caregiver is considering crushing his medications to aid in administration due to his swallowing difficulties. He has a history of a feeding tube, which he strongly opposes having reinserted.  His current medications include oxycodone , ibuprofen , Keppra  (now in liquid form), Florinef , and dexamethasone . He takes dexamethasone  0.75 mg three times a day and Florinef  with a rotating dose schedule. He cannot take Tylenol  due to cirrhosis of the liver and is allergic to Vicodin. His caregiver reports that his blood pressure is generally high in the mornings and decreases throughout the day, especially after taking oxycodone .   Goals of care 05/22/23: Recent ENT evaluations revealed scar tissue and residue from prior treatments, but no current blockages or issues requiring intervention.  Daughter expressed her understanding after several evaluations with different providers.  They are not seeking any  treatment at this time.  She is clear and expressed wishes to continue to treat the treatable allow her her father every opportunity to continue to thrive.  Expresses understanding of recent recommendation for hospice consideration by oncologist however they are not prepared to consider services at this time.  Daughter states her main priority is managing his symptoms for as long as they can.  She will be open to considering the patient's health further declined or presented as a more terminal state  I discussed the importance of continued conversation with family and their medical providers regarding overall plan of care and treatment options, ensuring decisions are within the context of the patients values and GOCs. Assessment & Plan Cancer related pain Chronic head and neck pain, exacerbated by hypertension. Oxycodone  effective but causes hypotension. Ibuprofen  less effective for severe pain. Tramadol  ineffective for head pain. Awaiting PET scan for further treatment guidance. - Continue oxycodone  liquid, monitor blood pressure closely. - Use ibuprofen  when blood pressure is low. - Await PET scan results for further treatment options, including potential radiation therapy.  Hypotension due to medication Oxycodone  causes significant hypotension, unmitigated by dose splitting. - Monitor blood pressure closely with oxycodone  use. - Use ibuprofen  as an alternative when blood pressure is low.  Dysphagia Difficulty swallowing, prefers oral intake over feeding tube. Resistant to feeding tube placement. - Crush medications that are safe to crush. - Order liquid formulations where possible. - Discuss feeding tube placement if nutritional intake does not improve, respecting his preferences.  Head and neck cancer Previous treatment with residual effects. Awaiting PET scan to assess for recurrence and guide treatment. - Proceed with PET scan on July 21. - Consult Radiation oncologist post-PET scan for  potential radiation therapy.  Hypertension Fluctuating blood pressure, contributing to headaches. Managed with dexamethasone  and Florinef . - Continue dexamethasone  and Florinef . - Monitor blood pressure regularly.  Goals of Care Focus on improving functional status by managing blood pressure and increasing energy, maintaining quality of life without aggressive interventions. Emphasis on weight gain and nutrition. Family involved in care decisions. - Encourage nutritional intake and weight gain. - Maintain open communication with family regarding care decisions and goals. - Continue current management to improve functional status. - Reassess goals of care based on condition and preferences. - Patient and daughter clear in their understanding to treat the treatable allowing every opportunity to continue to thrive.  Education has been provided on outpatient hospice services.  At this time they have declined however will notify medical team when they are in agreement with pursuing support. Focus on quality of life and comfort. Prefers oral intake, resistant to feeding tube. - Prioritize comfort  and quality of life. - Respect preference to avoid feeding tube unless necessary.  Follow-up Cardiology appointment scheduled for blood pressure management. No primary care or endocrinology follow-ups until June. - Follow up with cardiologist tomorrow, communicate findings on blood pressure and steroid use. - Schedule follow-up in six weeks, or three weeks if steroids are initiated.  Patient expressed understanding and was in agreement with this plan. He also understands that He can call the clinic at any time with any questions, concerns, or complaints.   Any controlled substances utilized were prescribed in the context of palliative care. PDMP has been reviewed.   Visit consisted of counseling and education dealing with the complex and emotionally intense issues of symptom management and palliative  care in the setting of serious and potentially life-threatening illness.  Levon Borer, AGPCNP-BC  Palliative Medicine Team/[facility]

## 2023-09-11 NOTE — Progress Notes (Signed)
  Assessment and Plan   1. Anemia of chronic disease (Primary) -     CBC And Differential; Future 2. Tracheostomy status (*) 3. Protein-calorie malnutrition, severe (*) -     Comprehensive Metabolic Panel; Future 4. Cancer of base of tongue (*) 5. Acquired hypothyroidism -     TSH Rfx on Abnormal to Free T4; Future 6. Essential hypertension 7. Orthostatic hypotension    1  repeat labs today 2 stable 3 labs today   protein low will continue protein drinks between meals 4 managed by ENT, oncology 5  discussed results of TSH with Dr Beryl   no changes in medication dose recommended 6-7 blood pressure stable today at 108/60   Risks, benefits, and alternatives of the medications and treatment plan prescribed today were discussed, and patient expressed understanding. Plan follow-up as discussed or as needed if any worsening symptoms or change in condition.      Subjective   [name] is a 73 y.o. (DOB [birth year]) male who presents for the following:    Patient presents with  . 3 mth f/up    Bp and labs. Pt had cereal this morning.     Anemia Presents for follow-up visit. Symptoms include light-headedness and weight loss. There has been no abdominal pain, anorexia, bruising/bleeding easily, confusion, fever, leg swelling, malaise/fatigue, pallor, palpitations, paresthesias or pica. There are no compliance problems.      Reviewed and updated this visit by provider: Tobacco  Allergies  Meds  Problems  Med Hx  Surg Hx  Fam Hx  PDMP      Review of Systems  Constitutional:  Positive for weight loss. Negative for fever and malaise/fatigue.  Cardiovascular:  Negative for palpitations.  Gastrointestinal:  Negative for abdominal pain and anorexia.  Skin:  Negative for pallor.  Neurological:  Positive for light-headedness. Negative for paresthesias.  Hematological:  Does not bruise/bleed easily.  Psychiatric/Behavioral:  Negative for confusion.           Objective    Vitals:   09/11/23 1256  BP: 108/60  Patient Position: Sitting  Pulse: 79  Temp: 97.3 F (36.3 C)  TempSrc: Skin  Resp: 18  Height: 5' 11 (1.803 m)  Weight: Comment: wheelchair  SpO2: 99%  PainSc: 0-No pain    Physical Exam Vitals and nursing note reviewed.  Constitutional:      Appearance: He is ill-appearing.  HENT:     Head: Normocephalic.     Comments: Holds left jaw due to pain from oral cancer     Right Ear: External ear normal.     Left Ear: External ear normal.     Mouth/Throat:     Mouth: Mucous membranes are moist.  Eyes:     Conjunctiva/sclera: Conjunctivae normal.  Cardiovascular:     Rate and Rhythm: Normal rate and regular rhythm.     Pulses: Normal pulses.     Heart sounds: Normal heart sounds.  Pulmonary:     Effort: Pulmonary effort is normal.     Breath sounds: Normal breath sounds.  Abdominal:     Palpations: Abdomen is soft.  Neurological:     Mental Status: He is alert and oriented to person, place, and time.     Comments: Does not speak much due to tracheostomy

## 2023-09-12 NOTE — Progress Notes (Signed)
 Any chance that his thyroid  labs are bad due to his radiation of his oral cancer?

## 2023-09-17 ENCOUNTER — Encounter (HOSPITAL_COMMUNITY): Payer: Self-pay

## 2023-09-17 ENCOUNTER — Inpatient Hospital Stay (HOSPITAL_COMMUNITY)
Admission: EM | Admit: 2023-09-17 | Discharge: 2023-09-20 | DRG: 147 | Disposition: A | Discharge status: Home Health | Attending: Family Medicine | Admitting: Family Medicine

## 2023-09-17 DIAGNOSIS — I130 Hypertensive heart and chronic kidney disease with heart failure and stage 1 through stage 4 chronic kidney disease, or unspecified chronic kidney disease: Secondary | ICD-10-CM | POA: Diagnosis present

## 2023-09-17 DIAGNOSIS — Z888 Allergy status to other drugs, medicaments and biological substances status: Secondary | ICD-10-CM

## 2023-09-17 DIAGNOSIS — Z885 Allergy status to narcotic agent status: Secondary | ICD-10-CM

## 2023-09-17 DIAGNOSIS — Z7952 Long term (current) use of systemic steroids: Secondary | ICD-10-CM

## 2023-09-17 DIAGNOSIS — Z96641 Presence of right artificial hip joint: Secondary | ICD-10-CM | POA: Diagnosis present

## 2023-09-17 DIAGNOSIS — E8809 Other disorders of plasma-protein metabolism, not elsewhere classified: Secondary | ICD-10-CM | POA: Diagnosis present

## 2023-09-17 DIAGNOSIS — D72829 Elevated white blood cell count, unspecified: Secondary | ICD-10-CM | POA: Diagnosis present

## 2023-09-17 DIAGNOSIS — R58 Hemorrhage, not elsewhere classified: Secondary | ICD-10-CM | POA: Diagnosis present

## 2023-09-17 DIAGNOSIS — Z800 Family history of malignant neoplasm of digestive organs: Secondary | ICD-10-CM

## 2023-09-17 DIAGNOSIS — Z8719 Personal history of other diseases of the digestive system: Secondary | ICD-10-CM

## 2023-09-17 DIAGNOSIS — F1011 Alcohol abuse, in remission: Secondary | ICD-10-CM | POA: Diagnosis present

## 2023-09-17 DIAGNOSIS — H9190 Unspecified hearing loss, unspecified ear: Secondary | ICD-10-CM | POA: Diagnosis present

## 2023-09-17 DIAGNOSIS — C321 Malignant neoplasm of supraglottis: Secondary | ICD-10-CM | POA: Diagnosis present

## 2023-09-17 DIAGNOSIS — Z87891 Personal history of nicotine dependence: Secondary | ICD-10-CM

## 2023-09-17 DIAGNOSIS — I2510 Atherosclerotic heart disease of native coronary artery without angina pectoris: Secondary | ICD-10-CM | POA: Diagnosis present

## 2023-09-17 DIAGNOSIS — Z7189 Other specified counseling: Secondary | ICD-10-CM

## 2023-09-17 DIAGNOSIS — R64 Cachexia: Secondary | ICD-10-CM | POA: Diagnosis present

## 2023-09-17 DIAGNOSIS — Z85818 Personal history of malignant neoplasm of other sites of lip, oral cavity, and pharynx: Secondary | ICD-10-CM

## 2023-09-17 DIAGNOSIS — E890 Postprocedural hypothyroidism: Secondary | ICD-10-CM | POA: Diagnosis present

## 2023-09-17 DIAGNOSIS — Z825 Family history of asthma and other chronic lower respiratory diseases: Secondary | ICD-10-CM

## 2023-09-17 DIAGNOSIS — Z8701 Personal history of pneumonia (recurrent): Secondary | ICD-10-CM

## 2023-09-17 DIAGNOSIS — K1379 Other lesions of oral mucosa: Secondary | ICD-10-CM | POA: Diagnosis not present

## 2023-09-17 DIAGNOSIS — Z515 Encounter for palliative care: Secondary | ICD-10-CM

## 2023-09-17 DIAGNOSIS — Z801 Family history of malignant neoplasm of trachea, bronchus and lung: Secondary | ICD-10-CM

## 2023-09-17 DIAGNOSIS — I5189 Other ill-defined heart diseases: Secondary | ICD-10-CM

## 2023-09-17 DIAGNOSIS — E785 Hyperlipidemia, unspecified: Secondary | ICD-10-CM | POA: Diagnosis present

## 2023-09-17 DIAGNOSIS — R627 Adult failure to thrive: Secondary | ICD-10-CM | POA: Diagnosis present

## 2023-09-17 DIAGNOSIS — G40909 Epilepsy, unspecified, not intractable, without status epilepticus: Secondary | ICD-10-CM | POA: Diagnosis present

## 2023-09-17 DIAGNOSIS — D631 Anemia in chronic kidney disease: Secondary | ICD-10-CM | POA: Diagnosis present

## 2023-09-17 DIAGNOSIS — C01 Malignant neoplasm of base of tongue: Principal | ICD-10-CM | POA: Diagnosis present

## 2023-09-17 DIAGNOSIS — K7460 Unspecified cirrhosis of liver: Secondary | ICD-10-CM | POA: Diagnosis present

## 2023-09-17 DIAGNOSIS — Z79899 Other long term (current) drug therapy: Secondary | ICD-10-CM

## 2023-09-17 DIAGNOSIS — C029 Malignant neoplasm of tongue, unspecified: Secondary | ICD-10-CM | POA: Diagnosis present

## 2023-09-17 DIAGNOSIS — Z9221 Personal history of antineoplastic chemotherapy: Secondary | ICD-10-CM

## 2023-09-17 DIAGNOSIS — I5042 Chronic combined systolic (congestive) and diastolic (congestive) heart failure: Secondary | ICD-10-CM | POA: Diagnosis present

## 2023-09-17 DIAGNOSIS — G893 Neoplasm related pain (acute) (chronic): Secondary | ICD-10-CM | POA: Diagnosis present

## 2023-09-17 DIAGNOSIS — Z8521 Personal history of malignant neoplasm of larynx: Secondary | ICD-10-CM

## 2023-09-17 DIAGNOSIS — Z8674 Personal history of sudden cardiac arrest: Secondary | ICD-10-CM

## 2023-09-17 DIAGNOSIS — D62 Acute posthemorrhagic anemia: Secondary | ICD-10-CM | POA: Diagnosis present

## 2023-09-17 DIAGNOSIS — Z7982 Long term (current) use of aspirin: Secondary | ICD-10-CM

## 2023-09-17 DIAGNOSIS — Z79890 Hormone replacement therapy (postmenopausal): Secondary | ICD-10-CM

## 2023-09-17 DIAGNOSIS — E271 Primary adrenocortical insufficiency: Secondary | ICD-10-CM | POA: Diagnosis present

## 2023-09-17 DIAGNOSIS — Z8249 Family history of ischemic heart disease and other diseases of the circulatory system: Secondary | ICD-10-CM

## 2023-09-17 DIAGNOSIS — Z8349 Family history of other endocrine, nutritional and metabolic diseases: Secondary | ICD-10-CM

## 2023-09-17 DIAGNOSIS — E039 Hypothyroidism, unspecified: Secondary | ICD-10-CM | POA: Diagnosis present

## 2023-09-17 DIAGNOSIS — Z923 Personal history of irradiation: Secondary | ICD-10-CM

## 2023-09-17 DIAGNOSIS — N1831 Chronic kidney disease, stage 3a: Secondary | ICD-10-CM | POA: Diagnosis present

## 2023-09-17 DIAGNOSIS — Z886 Allergy status to analgesic agent status: Secondary | ICD-10-CM

## 2023-09-17 LAB — BASIC METABOLIC PANEL WITH GFR
Anion gap: 9 (ref 5–15)
BUN: 38 mg/dL — ABNORMAL HIGH (ref 8–23)
CO2: 25 mmol/L (ref 22–32)
Calcium: 7.9 mg/dL — ABNORMAL LOW (ref 8.9–10.3)
Chloride: 103 mmol/L (ref 98–111)
Creatinine, Ser: 0.87 mg/dL (ref 0.61–1.24)
GFR, Estimated: >60 mL/min (ref >60)
Glucose, Bld: 106 mg/dL — ABNORMAL HIGH (ref 70–99)
Potassium: 3.5 mmol/L (ref 3.5–5.1)
Sodium: 137 mmol/L (ref 135–145)

## 2023-09-17 LAB — HEMOGLOBIN AND HEMATOCRIT, BLOOD
HCT: 32 % — ABNORMAL LOW (ref 39.0–52.0)
HCT: 30.1 % — ABNORMAL LOW (ref 39.0–52.0)
Hemoglobin: 10.1 g/dL — ABNORMAL LOW (ref 13.0–17.0)
Hemoglobin: 9.5 g/dL — ABNORMAL LOW (ref 13.0–17.0)

## 2023-09-17 LAB — TYPE AND SCREEN
ABO/RH(D): A POS
Antibody Screen: NEGATIVE

## 2023-09-17 LAB — CBC WITH DIFFERENTIAL/PLATELET
Abs Immature Granulocytes: 0.08 K/uL — ABNORMAL HIGH (ref 0.00–0.07)
Basophils Absolute: 0 K/uL (ref 0.0–0.1)
Basophils Relative: 0 %
Eosinophils Absolute: 0 K/uL (ref 0.0–0.5)
Eosinophils Relative: 0 %
HCT: 31.8 % — ABNORMAL LOW (ref 39.0–52.0)
Hemoglobin: 10 g/dL — ABNORMAL LOW (ref 13.0–17.0)
Immature Granulocytes: 1 %
Lymphocytes Relative: 14 %
Lymphs Abs: 1.7 K/uL (ref 0.7–4.0)
MCH: 28.8 pg (ref 26.0–34.0)
MCHC: 31.4 g/dL (ref 30.0–36.0)
MCV: 91.6 fL (ref 80.0–100.0)
Monocytes Absolute: 0.9 K/uL (ref 0.1–1.0)
Monocytes Relative: 7 %
Neutro Abs: 9.3 K/uL — ABNORMAL HIGH (ref 1.7–7.7)
Neutrophils Relative %: 78 %
Platelets: 239 K/uL (ref 150–400)
RBC: 3.47 MIL/uL — ABNORMAL LOW (ref 4.22–5.81)
RDW: 14.6 % (ref 11.5–15.5)
WBC: 11.9 K/uL — ABNORMAL HIGH (ref 4.0–10.5)
nRBC: 0 % (ref 0.0–0.2)

## 2023-09-17 LAB — PROTIME-INR
INR: 1.3 — ABNORMAL HIGH (ref 0.8–1.2)
Prothrombin Time: 16.5 s — ABNORMAL HIGH (ref 11.4–15.2)

## 2023-09-17 MED ORDER — TRANEXAMIC ACID FOR INHALATION
500.0000 mg | Freq: Once | RESPIRATORY_TRACT | Status: AC
  Administered 2023-09-17: 500 mg via RESPIRATORY_TRACT
  Filled 2023-09-17: qty 10

## 2023-09-17 MED ORDER — LEVETIRACETAM 100 MG/ML PO SOLN
500.0000 mg | Freq: Two times a day (BID) | ORAL | Status: DC
  Administered 2023-09-17 – 2023-09-20 (×6): 500 mg via ORAL
  Filled 2023-09-17 (×6): qty 5

## 2023-09-17 MED ORDER — DRONABINOL 5 MG PO CAPS
5.0000 mg | ORAL_CAPSULE | Freq: Two times a day (BID) | ORAL | Status: DC
  Administered 2023-09-18 – 2023-09-20 (×5): 5 mg via ORAL
  Filled 2023-09-17 (×5): qty 1

## 2023-09-17 MED ORDER — DEXAMETHASONE 0.5 MG PO TABS
0.7500 mg | ORAL_TABLET | Freq: Three times a day (TID) | ORAL | Status: DC
  Administered 2023-09-17: 0.75 mg via ORAL
  Filled 2023-09-17 (×2): qty 1.5

## 2023-09-17 MED ORDER — OXYCODONE HCL 5 MG/5ML PO SOLN
2.5000 mg | Freq: Four times a day (QID) | ORAL | Status: DC | PRN
  Administered 2023-09-17 – 2023-09-20 (×6): 5 mg via ORAL
  Filled 2023-09-17 (×6): qty 5

## 2023-09-17 MED ORDER — MAGIC MOUTHWASH
5.0000 mL | Freq: Four times a day (QID) | ORAL | Status: DC | PRN
  Administered 2023-09-18: 5 mL via ORAL
  Filled 2023-09-17 (×2): qty 5

## 2023-09-17 MED ORDER — FINASTERIDE 5 MG PO TABS
5.0000 mg | ORAL_TABLET | Freq: Every day | ORAL | Status: DC
  Administered 2023-09-17 – 2023-09-20 (×4): 5 mg via ORAL
  Filled 2023-09-17 (×4): qty 1

## 2023-09-17 MED ORDER — LEVOTHYROXINE SODIUM 75 MCG PO TABS
75.0000 ug | ORAL_TABLET | Freq: Every day | ORAL | Status: DC
  Administered 2023-09-18 – 2023-09-20 (×3): 75 ug via ORAL
  Filled 2023-09-17: qty 1
  Filled 2023-09-17 (×2): qty 3

## 2023-09-17 MED ORDER — VITAMIN B-12 1000 MCG PO TABS
1000.0000 ug | ORAL_TABLET | Freq: Every day | ORAL | Status: DC
  Administered 2023-09-18 – 2023-09-20 (×3): 1000 ug via ORAL
  Filled 2023-09-17 (×3): qty 1

## 2023-09-17 MED ORDER — ONDANSETRON HCL 4 MG PO TABS: 4.0000 mg | ORAL_TABLET | Freq: Four times a day (QID) | ORAL | Status: DC | PRN

## 2023-09-17 MED ORDER — ROSUVASTATIN CALCIUM 5 MG PO TABS
5.0000 mg | ORAL_TABLET | Freq: Every day | ORAL | Status: DC
  Administered 2023-09-17 – 2023-09-19 (×3): 5 mg via ORAL
  Filled 2023-09-17 (×3): qty 1

## 2023-09-17 MED ORDER — DEXAMETHASONE 0.5 MG PO TABS
0.7500 mg | ORAL_TABLET | Freq: Three times a day (TID) | ORAL | Status: DC
  Administered 2023-09-18 – 2023-09-20 (×7): 0.75 mg via ORAL
  Filled 2023-09-17 (×9): qty 1.5

## 2023-09-17 MED ORDER — FLUDROCORTISONE ACETATE 0.1 MG PO TABS
0.1000 mg | ORAL_TABLET | Freq: Every day | ORAL | Status: DC
  Administered 2023-09-18 – 2023-09-20 (×3): 0.1 mg via ORAL
  Filled 2023-09-17 (×3): qty 1

## 2023-09-17 MED ORDER — ONDANSETRON HCL 4 MG/2ML IJ SOLN: 4.0000 mg | Freq: Four times a day (QID) | INTRAMUSCULAR | Status: DC | PRN

## 2023-09-17 MED ORDER — FOLIC ACID 1 MG PO TABS
1.0000 mg | ORAL_TABLET | Freq: Every day | ORAL | Status: DC
  Administered 2023-09-18 – 2023-09-20 (×3): 1 mg via ORAL
  Filled 2023-09-17 (×3): qty 1

## 2023-09-17 NOTE — ED Notes (Signed)
 550 cc of amber urine emptied
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
## 2023-09-17 NOTE — Progress Notes (Signed)
 [facility] admitting physician addendum:  The patient has not had further bleeding.  He seems to be stable.  Stepdown bed is not available at the moment.  Will request a telemetry bed.  Alm Castor, MD.

## 2023-09-17 NOTE — ED Provider Notes (Signed)
 [place] EMERGENCY DEPARTMENT AT [facility] Provider Note   CSN: [id number] Arrival date & time: 09/17/23  9077     Patient presents with: Mouth Lesions   [name] is a 73 y.o. male.   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  He presents with bleeding from his tongue lesion.  This was noted this morning.  Bleeding appears to have stopped during EMS transport from home. Quantity of blood loss is difficulty to ascertain.  Patient does not have other complaint.  He denies pain.  He appears to be full code.  Patient was seen by palliative service on July 7.  Patient known to Dr. Carlie, ENT.    The history is provided by the patient, the EMS personnel and medical records.       Prior to Admission medications   Medication Sig Start Date End Date Taking? Authorizing Provider  aspirin  EC 81 MG tablet Take 81 mg by mouth daily. Swallow whole.    [provider]  cyanocobalamin  (VITAMIN B12) 500 MCG tablet Take 2 tablets (1,000 mcg total) by mouth daily. 04/18/22   Regalado, Belkys A, MD  dexamethasone  (DECADRON ) 0.75 MG tablet Take 0.75 mg by mouth 3 (three) times daily. 08/27/23   [provider]  diclofenac  Sodium (VOLTAREN ) 1 % GEL Apply 2 g topically 2 (two) times daily as needed (pain).    [provider]  dronabinol  (MARINOL ) 5 MG capsule Take 1 capsule (5 mg total) by mouth 2 (two) times daily before lunch and supper. 05/22/23   Pickenpack-Cousar, Athena N, NP  finasteride  (PROSCAR ) 5 MG tablet Take 1 tablet (5 mg total) by mouth daily. 01/24/23 01/24/24  Rosendo Rush, MD  fludrocortisone  (FLORINEF ) 0.1 MG tablet Take 0.1 mg by mouth daily. Takes 1/2 tablet daily 04/27/23   [provider]  folic acid  (FOLVITE ) 1 MG tablet Take 1 mg by mouth daily. 02/13/23   [provider]  ibuprofen  (ADVIL ) 100 MG/5ML suspension Take 30 mLs (600 mg total) by mouth every 8 (eight) hours as needed for mild pain (pain score 1-3) or moderate pain (pain score 4-6). 05/01/23   Pickenpack-Cousar, Fannie SAILOR, NP  levETIRAcetam  (KEPPRA ) 100 MG/ML solution Take 5 mLs (500 mg total) by mouth 2 (two) times daily. 05/31/23   Whitfield Raisin, NP  levothyroxine  (SYNTHROID ) 75 MCG tablet Take 75 mcg by mouth daily before breakfast. 09/22/22   [provider]  magic mouthwash SOLN Take 5 mLs by mouth 4 (four) times daily as needed (swish and spit). 06/13/23   Cloretta Arley NOVAK, MD  NEOMYCIN-POLYMYXIN-HYDROCORTISONE  (CORTISPORIN) 1 % SOLN OTIC solution Place 4 drops into the left ear 3 (three) times daily as needed (Irritation). 01/26/23   [provider]  oxyCODONE  (ROXICODONE ) 5 MG/5ML solution Take 2.5-5 mLs (2.5-5 mg total) by mouth every 6 (six) hours as needed for severe pain (pain score 7-10). 09/10/23   Pickenpack-Cousar, Fannie SAILOR, NP  rosuvastatin  (CRESTOR ) 5 MG tablet Take 5 mg by mouth at bedtime. 03/12/23   [provider]    Allergies: Succinylcholine, Tylenol  [acetaminophen ], Vicodin [hydrocodone-acetaminophen ], Other, Tamsulosin , and Protonix  [pantoprazole ]    Review of Systems  All other systems reviewed and are negative.   Updated Vital Signs SpO2 99%   Physical Exam Vitals and nursing note  reviewed.  Constitutional:      General: He is not in acute distress.    Appearance: Normal appearance. He is well-developed.  HENT:     Head: Normocephalic and atraumatic.     Comments: Lesion to the posterior tongue with clotted blood present.  No active hemorrhage noted. Eyes:     Conjunctiva/sclera: Conjunctivae normal.     Pupils: Pupils are equal, round, and reactive to light.  Cardiovascular:     Rate and Rhythm: Normal rate and regular rhythm.      Heart sounds: Normal heart sounds.  Pulmonary:     Effort: Pulmonary effort is normal. No respiratory distress.     Breath sounds: Normal breath sounds.  Abdominal:     General: There is no distension.     Palpations: Abdomen is soft.     Tenderness: There is no abdominal tenderness.  Musculoskeletal:        General: No deformity. Normal range of motion.     Cervical back: Normal range of motion and neck supple.  Skin:    General: Skin is warm and dry.  Neurological:     General: No focal deficit present.     Mental Status: He is alert and oriented to person, place, and time.     (all labs ordered are listed, but only abnormal results are displayed) Labs Reviewed  CBC WITH DIFFERENTIAL/PLATELET  BASIC METABOLIC PANEL WITH GFR  PROTIME-INR  TYPE AND SCREEN    EKG: None  Radiology: No results found.   Procedures   Medications Ordered in the ED  tranexamic acid  (CYKLOKAPRON ) 1000 MG/10ML nebulizer solution 500 mg (has no administration in time range)                                    Medical Decision Making Amount and/or Complexity of Data Reviewed Labs: ordered.    Medical Screen Complete  This patient presented to the ED with complaint of bleeding from tongue.  This complaint involves an extensive number of treatment options. The initial differential diagnosis includes, but is not limited to, hemorrhage, airway compromise, metabolic abnormality, anemia, etc.  This presentation is: Acute, Chronic, Self-Limited, Previously Undiagnosed, Uncertain Prognosis, Complicated, Systemic Symptoms, and Threat to Life/Bodily Function  Patient with known squamous cell carcinoma of the base of the tongue presents with active hemorrhage from lesion.  Hemorrhage stopped prior to arrival.  Case discussed with patient's ENT provider Dr. Carlie.  He agrees with plan to admit for observation.  Patient would benefit from consultation by oncology team (Dr. Cloretta) who knows the  patient.  Patient may be a candidate for IR embolization if he has recurrent bleeding per Dr. Carlie.  Dr. Carlie will come and evaluate the patient later today.   Hospitalist service made aware of case.    Additional history obtained:  External records from outside sources obtained and reviewed including prior ED visits and prior Inpatient records.    Problem List / ED Course:  Squamous cell carcinoma at the base of the tongue with associated hemorrhage   Reevaluation:  After the interventions noted above, I reevaluated the patient and found that they have: improved   Disposition:  After consideration of the diagnostic results and the patients response to treatment, I feel that the patent would benefit from admission.   CRITICAL CARE Performed by: Maude JAYSON Galloway   Total critical care time: 30 minutes  Critical care  time was exclusive of separately billable procedures and treating other patients.  Critical care was necessary to treat or prevent imminent or life-threatening deterioration.  Critical care was time spent personally by me on the following activities: development of treatment plan with patient and/or surrogate as well as nursing, discussions with consultants, evaluation of patient's response to treatment, examination of patient, obtaining history from patient or surrogate, ordering and performing treatments and interventions, ordering and review of laboratory studies, ordering and review of radiographic studies, pulse oximetry and re-evaluation of patient's condition.       Final diagnoses:  Squamous cell carcinoma of base of tongue [facility])    ED Discharge Orders     None          Laurice Maude BROCKS, MD 09/17/23 1332

## 2023-09-17 NOTE — ED Notes (Signed)
 ED Provider at bedside.

## 2023-09-17 NOTE — ED Notes (Signed)
 Pt's daughter-in-law, Charmin Shorter, can be reached at [phone].

## 2023-09-17 NOTE — ED Notes (Signed)
 This RN called daughter Clotilda Snipes(HPOA) and gave update on Pt being moved to his room on 6th floor.
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
## 2023-09-17 NOTE — ED Notes (Signed)
 Floor called and made aware hat Pt is on his way up.

## 2023-09-17 NOTE — ED Triage Notes (Signed)
 Per EMS, Pt, from home, c/o tongue bleeding from a cancerous lesion starting this morning.  Denies pain.  Hx of mouth CA.  Pt is not receiving treatments.  Slight bleeding noted.  No trauma noted.    EMS reports Pt spit up a significant amount of blood this morning.    Pt is HOH and non-verbal d/t a trach.

## 2023-09-18 ENCOUNTER — Other Ambulatory Visit: Payer: Self-pay

## 2023-09-18 DIAGNOSIS — K1379 Other lesions of oral mucosa: Secondary | ICD-10-CM | POA: Diagnosis not present

## 2023-09-18 LAB — CBC
HCT: 30.4 % — ABNORMAL LOW (ref 39.0–52.0)
Hemoglobin: 9.3 g/dL — ABNORMAL LOW (ref 13.0–17.0)
MCH: 28.8 pg (ref 26.0–34.0)
MCHC: 30.6 g/dL (ref 30.0–36.0)
MCV: 94.1 fL (ref 80.0–100.0)
Platelets: 228 K/uL (ref 150–400)
RBC: 3.23 MIL/uL — ABNORMAL LOW (ref 4.22–5.81)
RDW: 14.6 % (ref 11.5–15.5)
WBC: 14.5 K/uL — ABNORMAL HIGH (ref 4.0–10.5)
nRBC: 0 % (ref 0.0–0.2)

## 2023-09-18 LAB — COMPREHENSIVE METABOLIC PANEL WITH GFR
ALT: 10 U/L (ref 0–44)
AST: 9 U/L — ABNORMAL LOW (ref 15–41)
Albumin: 1.9 g/dL — ABNORMAL LOW (ref 3.5–5.0)
Alkaline Phosphatase: 55 U/L (ref 38–126)
Anion gap: 7 (ref 5–15)
BUN: 41 mg/dL — ABNORMAL HIGH (ref 8–23)
CO2: 25 mmol/L (ref 22–32)
Calcium: 7.8 mg/dL — ABNORMAL LOW (ref 8.9–10.3)
Chloride: 107 mmol/L (ref 98–111)
Creatinine, Ser: 0.77 mg/dL (ref 0.61–1.24)
GFR, Estimated: >60 mL/min (ref >60)
Glucose, Bld: 112 mg/dL — ABNORMAL HIGH (ref 70–99)
Potassium: 3.5 mmol/L (ref 3.5–5.1)
Sodium: 139 mmol/L (ref 135–145)
Total Bilirubin: 1.4 mg/dL — ABNORMAL HIGH (ref 0.0–1.2)
Total Protein: 5.7 g/dL — ABNORMAL LOW (ref 6.5–8.1)

## 2023-09-18 LAB — MRSA NEXT GEN BY PCR, NASAL: MRSA by PCR Next Gen: NOT DETECTED

## 2023-09-18 LAB — HEMOGLOBIN AND HEMATOCRIT, BLOOD
HCT: 30.7 % — ABNORMAL LOW (ref 39.0–52.0)
Hemoglobin: 9.8 g/dL — ABNORMAL LOW (ref 13.0–17.0)

## 2023-09-18 MED ORDER — ENSURE PLUS HIGH PROTEIN PO LIQD
237.0000 mL | Freq: Two times a day (BID) | ORAL | Status: DC
  Administered 2023-09-18 – 2023-09-19 (×2): 237 mL via ORAL

## 2023-09-18 MED ORDER — ORAL CARE MOUTH RINSE: 15.0000 mL | OROMUCOSAL | Status: DC | PRN

## 2023-09-18 MED ORDER — CEPHALEXIN 500 MG PO CAPS
500.0000 mg | ORAL_CAPSULE | Freq: Four times a day (QID) | ORAL | Status: DC
Start: 2023-09-18 — End: 2023-09-20
  Administered 2023-09-18 – 2023-09-20 (×8): 500 mg via ORAL
  Filled 2023-09-18 (×8): qty 1

## 2023-09-18 MED ORDER — KETOROLAC TROMETHAMINE 15 MG/ML IJ SOLN
15.0000 mg | Freq: Four times a day (QID) | INTRAMUSCULAR | Status: DC | PRN
  Administered 2023-09-19 – 2023-09-20 (×5): 15 mg via INTRAVENOUS
  Filled 2023-09-18 (×5): qty 1

## 2023-09-18 MED ORDER — ORAL CARE MOUTH RINSE
15.0000 mL | OROMUCOSAL | Status: DC
  Administered 2023-09-18 – 2023-09-20 (×9): 15 mL via OROMUCOSAL
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
## 2023-09-18 NOTE — Care Management Obs Status (Signed)
 MEDICARE OBSERVATION STATUS NOTIFICATION   Patient Details  Name: [name] MRN: [id number] Date of Birth: [birth year]   Medicare Observation Status Notification Given:  Yes    Toy LITTIE Agar, RN 09/18/2023, 2:35 PM
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
## 2023-09-18 NOTE — Progress Notes (Addendum)
 PROGRESS NOTE    [name]  FMW:[id number] DOB: [birth year] DOA: 09/17/2023 PCP: Donata Snowman, PA-C   Brief Narrative:  73 y.o. 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  On presentation, hemoglobin was 10, WBC of 11.9.  ENT was consulted.  Assessment & Plan:   Bleeding from tongue cancer lesion Squamous cell cancer of the base of the tongue with recurrence Recurrent head and neck cancer Goals of care -Followed by oncology, ENT, palliative care and radiation oncology as an outpatient. - Patient presented with bleeding from his tongue lesion.  Bleeding seems to have stopped.  Hemoglobin currently stable.  Monitor H&H.  Transfuse as needed - ENT evaluation appreciated: ENT has consulted IR for possible embolization.  ENT also recommended oncology consultation.  I have consulted oncology/Dr. Cloretta.  Overall prognosis is guarded to poor.  As per prior notes: Hospice was also suggested but patient/family not ready for the same.  Consult palliative care.  Leukocytosis - Monitor  Anemia of chronic disease - From chronic illnesses and cancer.  Hemoglobin has remained stable.  Acute blood loss anemia has been ruled out.  Monitor  Chronic hypotension ?  Primary adrenal insufficiency - Continue dexamethasone  and fludrocortisone .  Failure to thrive Poor oral intake - Continue dronabinol .  Chronic renal disease stage IIIa - Stable  History of cirrhosis of liver - Outpatient  follow-up with PCP and GI  Hypoalbuminemia - From poor oral intake.  Consult nutrition  Hyperlipidemia Continue statin  Hypothyroidism - Continue levothyroxine   History of seizure disorder - Continue Keppra   Grade 1 diastolic dysfunction - No signs of volume overload.  Physical deconditioning - PT eval  Patient was recently started on Kelfex on 09/16/23 by PCP as per daughter (not sure of the reason). Daughter requests Kelfex to be resumed. I have resumed the Kelfex. Patient can follow up with PCP regarding the same.   DVT prophylaxis: SCDs Code Status: Full Family Communication: Daughter on phone Disposition Plan: Status is: Observation The patient will require care spanning > 2 midnights and should be moved to inpatient because: Of severity of illness.    Consultants: ENT/IR/oncology/palliative care  Procedures: None  Antimicrobials: None   Subjective: Patient seen and examined at bedside.  Poor historian.  No more bleeding from tongue reported.  No fever, vomiting, agitation reported.  Objective: Vitals:   09/18/23 0401 09/18/23 0600 09/18/23 0818 09/18/23 0955  BP:    97/66  Pulse: 65  76 72  Resp: 16 19  18   Temp:    98.7 F (37.1 C)  TempSrc:    Oral  SpO2: 100%  95% 98%  Weight:      Height:        Intake/Output Summary (Last 24 hours) at 09/18/2023 1110 Last data filed at 09/18/2023 9687 Gross per 24 hour  Intake 120 ml  Output 125 ml  Net -5 ml   Filed Weights   09/17/23 0941 09/17/23 2300  Weight: 42.6 kg 37.2 kg    Examination:  General exam: Appears calm and comfortable.  Looks chronically ill and deconditioned.  Extremities thinly built. ENT:  Tongue lesion present, not actively bleeding currently Respiratory system: Bilateral decreased breath sounds at bases Cardiovascular system: S1 & S2 heard, Rate controlled Gastrointestinal system: Abdomen is nondistended, soft and nontender. Normal bowel sounds heard. Extremities: No cyanosis,  clubbing, edema  Central nervous system: Alert and oriented.  Extremely slow to respond.  Poor historian.  No focal neurological deficits. Moving extremities Skin: No rashes, lesions or ulcers Psychiatry: Extremely flat affect.  Not agitated.  Data Reviewed: I have personally reviewed following labs and imaging studies  CBC: Recent Labs  Lab 09/17/23 1058 09/17/23 1712 09/17/23 2225 09/18/23 0551  WBC 11.9*  --   --  14.5*  NEUTROABS 9.3*  --   --   --   HGB 10.0* 10.1* 9.5* 9.3*  HCT 31.8* 32.0* 30.1* 30.4*  MCV 91.6  --   --  94.1  PLT 239  --   --  228   Basic Metabolic Panel: Recent Labs  Lab 09/17/23 1058 09/18/23 0551  NA 137 139  K 3.5 3.5  CL 103 107  CO2 25 25  GLUCOSE 106* 112*  BUN 38* 41*  CREATININE 0.87 0.77  CALCIUM  7.9* 7.8*   GFR: Estimated Creatinine Clearance: 44.6 mL/min (by C-G formula based on SCr of 0.77 mg/dL). Liver Function Tests: Recent Labs  Lab 09/18/23 0551  AST 9*  ALT 10  ALKPHOS 55  BILITOT 1.4*  PROT 5.7*  ALBUMIN 1.9*   No results for input(s): LIPASE, AMYLASE in the last 168 hours. No results for input(s): AMMONIA in the last 168 hours. Coagulation Profile: Recent Labs  Lab 09/17/23 1058  INR 1.3*   Cardiac Enzymes: No results for input(s): CKTOTAL, CKMB, CKMBINDEX, TROPONINI in the last 168 hours. BNP (last 3 results) No results for input(s): PROBNP in the last 8760 hours. HbA1C: No results for input(s): HGBA1C in the last 72 hours. CBG: No results for input(s): GLUCAP in the last 168 hours. Lipid Profile: No results for input(s): CHOL, HDL, LDLCALC, TRIG, CHOLHDL, LDLDIRECT in the last 72 hours. Thyroid  Function Tests: No results for input(s): TSH, T4TOTAL, FREET4, T3FREE, THYROIDAB in the last 72 hours. Anemia Panel: No results for input(s): VITAMINB12, FOLATE, FERRITIN, TIBC, IRON, RETICCTPCT in the last 72 hours. Sepsis Labs: No results for input(s):  PROCALCITON, LATICACIDVEN in the last 168 hours.  Recent Results (from the past 240 hours)  MRSA Next Gen by PCR, Nasal     Status: None   Collection Time: 09/18/23  3:37 AM   Specimen: Nasal Mucosa; Nasal Swab  Result Value Ref Range Status   MRSA by PCR Next Gen NOT DETECTED NOT DETECTED Final    Comment: (NOTE) The GeneXpert MRSA Assay (FDA approved for NASAL specimens only), is one component of a comprehensive MRSA colonization surveillance program. It is not intended to diagnose MRSA infection nor to guide or monitor treatment for MRSA infections. Test performance is not FDA approved in patients less than 41 years old. Performed at [facility], [address] [place], [place] [address]          Radiology Studies: No results found.      Scheduled Meds:  cyanocobalamin   1,000 mcg Oral Daily   dexamethasone   0.75 mg Oral TID   dronabinol   5 mg Oral BID AC   finasteride   5 mg Oral Daily   fludrocortisone   0.1 mg Oral Daily   folic acid   1 mg Oral Daily   levETIRAcetam   500 mg Oral BID   levothyroxine   75 mcg Oral Q0600   mouth rinse  15 mL Mouth Rinse 4 times per day   rosuvastatin   5 mg Oral QHS   Continuous Infusions:        Sophie Mao, MD [facility] 09/18/2023, 11:10 AM

## 2023-09-18 NOTE — Progress Notes (Signed)
 PT denies need for stoma suctioning at this time, confirms her is breathing well, no signs or symptoms of respiratory distress at this time.

## 2023-09-18 NOTE — Progress Notes (Signed)
   09/18/23 1548  TOC Brief Assessment  Insurance and Status Reviewed  Patient has primary care physician Yes (Shepperson, Kirstin, PA-C)  Home environment has been reviewed home  Prior level of function: Independent  Prior/Current Home Services No current home services  Social Drivers of Health Review SDOH reviewed no interventions necessary  Readmission risk has been reviewed Yes  Transition of care needs no transition of care needs at this time

## 2023-09-18 NOTE — Plan of Care (Signed)

## 2023-09-18 NOTE — Plan of Care (Signed)
 Received patient from the ED via stretcher accompanied by ED RN, patient alert, no s/s of respiratory distress, no active bleeding. Patient c/o pain on his left side of his face that radiates to the lower Jaw, given PRN pain medication with minimal relief. Bed locked and in low position, call light given and within reach.  Comfort measures provided.  Problem: Pain Managment: Goal: General experience of comfort will improve and/or be controlled Outcome: Progressing

## 2023-09-19 DIAGNOSIS — D631 Anemia in chronic kidney disease: Secondary | ICD-10-CM | POA: Diagnosis present

## 2023-09-19 DIAGNOSIS — G893 Neoplasm related pain (acute) (chronic): Secondary | ICD-10-CM | POA: Diagnosis present

## 2023-09-19 DIAGNOSIS — Z9221 Personal history of antineoplastic chemotherapy: Secondary | ICD-10-CM | POA: Diagnosis not present

## 2023-09-19 DIAGNOSIS — R627 Adult failure to thrive: Secondary | ICD-10-CM | POA: Diagnosis present

## 2023-09-19 DIAGNOSIS — D62 Acute posthemorrhagic anemia: Secondary | ICD-10-CM | POA: Diagnosis not present

## 2023-09-19 DIAGNOSIS — E8809 Other disorders of plasma-protein metabolism, not elsewhere classified: Secondary | ICD-10-CM | POA: Diagnosis present

## 2023-09-19 DIAGNOSIS — C029 Malignant neoplasm of tongue, unspecified: Secondary | ICD-10-CM | POA: Diagnosis present

## 2023-09-19 DIAGNOSIS — K7460 Unspecified cirrhosis of liver: Secondary | ICD-10-CM | POA: Diagnosis present

## 2023-09-19 DIAGNOSIS — R64 Cachexia: Secondary | ICD-10-CM | POA: Diagnosis present

## 2023-09-19 DIAGNOSIS — E271 Primary adrenocortical insufficiency: Secondary | ICD-10-CM | POA: Diagnosis present

## 2023-09-19 DIAGNOSIS — Z923 Personal history of irradiation: Secondary | ICD-10-CM | POA: Diagnosis not present

## 2023-09-19 DIAGNOSIS — N1831 Chronic kidney disease, stage 3a: Secondary | ICD-10-CM

## 2023-09-19 DIAGNOSIS — C321 Malignant neoplasm of supraglottis: Secondary | ICD-10-CM | POA: Diagnosis not present

## 2023-09-19 DIAGNOSIS — F1011 Alcohol abuse, in remission: Secondary | ICD-10-CM | POA: Diagnosis present

## 2023-09-19 DIAGNOSIS — Z79890 Hormone replacement therapy (postmenopausal): Secondary | ICD-10-CM | POA: Diagnosis not present

## 2023-09-19 DIAGNOSIS — I130 Hypertensive heart and chronic kidney disease with heart failure and stage 1 through stage 4 chronic kidney disease, or unspecified chronic kidney disease: Secondary | ICD-10-CM | POA: Diagnosis present

## 2023-09-19 DIAGNOSIS — Z515 Encounter for palliative care: Secondary | ICD-10-CM

## 2023-09-19 DIAGNOSIS — E785 Hyperlipidemia, unspecified: Secondary | ICD-10-CM | POA: Diagnosis present

## 2023-09-19 DIAGNOSIS — E890 Postprocedural hypothyroidism: Secondary | ICD-10-CM | POA: Diagnosis present

## 2023-09-19 DIAGNOSIS — Z7982 Long term (current) use of aspirin: Secondary | ICD-10-CM | POA: Diagnosis not present

## 2023-09-19 DIAGNOSIS — K1379 Other lesions of oral mucosa: Secondary | ICD-10-CM | POA: Diagnosis present

## 2023-09-19 DIAGNOSIS — Z8249 Family history of ischemic heart disease and other diseases of the circulatory system: Secondary | ICD-10-CM | POA: Diagnosis not present

## 2023-09-19 DIAGNOSIS — Z7189 Other specified counseling: Secondary | ICD-10-CM

## 2023-09-19 DIAGNOSIS — Z885 Allergy status to narcotic agent status: Secondary | ICD-10-CM | POA: Diagnosis not present

## 2023-09-19 DIAGNOSIS — I2510 Atherosclerotic heart disease of native coronary artery without angina pectoris: Secondary | ICD-10-CM | POA: Diagnosis present

## 2023-09-19 DIAGNOSIS — E039 Hypothyroidism, unspecified: Secondary | ICD-10-CM

## 2023-09-19 DIAGNOSIS — I5042 Chronic combined systolic (congestive) and diastolic (congestive) heart failure: Secondary | ICD-10-CM | POA: Diagnosis present

## 2023-09-19 DIAGNOSIS — Z96641 Presence of right artificial hip joint: Secondary | ICD-10-CM | POA: Diagnosis present

## 2023-09-19 DIAGNOSIS — C01 Malignant neoplasm of base of tongue: Secondary | ICD-10-CM | POA: Diagnosis present

## 2023-09-19 DIAGNOSIS — G40909 Epilepsy, unspecified, not intractable, without status epilepticus: Secondary | ICD-10-CM | POA: Diagnosis present

## 2023-09-19 LAB — COMPREHENSIVE METABOLIC PANEL WITH GFR
ALT: 10 U/L (ref 0–44)
AST: 16 U/L (ref 15–41)
Albumin: 1.8 g/dL — ABNORMAL LOW (ref 3.5–5.0)
Alkaline Phosphatase: 59 U/L (ref 38–126)
Anion gap: 7 (ref 5–15)
BUN: 39 mg/dL — ABNORMAL HIGH (ref 8–23)
CO2: 29 mmol/L (ref 22–32)
Calcium: 7.9 mg/dL — ABNORMAL LOW (ref 8.9–10.3)
Chloride: 99 mmol/L (ref 98–111)
Creatinine, Ser: 0.72 mg/dL (ref 0.61–1.24)
GFR, Estimated: >60 mL/min (ref >60)
Glucose, Bld: 125 mg/dL — ABNORMAL HIGH (ref 70–99)
Potassium: 3.6 mmol/L (ref 3.5–5.1)
Sodium: 135 mmol/L (ref 135–145)
Total Bilirubin: 0.7 mg/dL (ref 0.0–1.2)
Total Protein: 5.6 g/dL — ABNORMAL LOW (ref 6.5–8.1)

## 2023-09-19 LAB — CBC WITH DIFFERENTIAL/PLATELET
Abs Immature Granulocytes: 0.09 K/uL — ABNORMAL HIGH (ref 0.00–0.07)
Basophils Absolute: 0 K/uL (ref 0.0–0.1)
Basophils Relative: 0 %
Eosinophils Absolute: 0 K/uL (ref 0.0–0.5)
Eosinophils Relative: 0 %
HCT: 26.8 % — ABNORMAL LOW (ref 39.0–52.0)
Hemoglobin: 8.4 g/dL — ABNORMAL LOW (ref 13.0–17.0)
Immature Granulocytes: 1 %
Lymphocytes Relative: 13 %
Lymphs Abs: 1.5 K/uL (ref 0.7–4.0)
MCH: 29.1 pg (ref 26.0–34.0)
MCHC: 31.3 g/dL (ref 30.0–36.0)
MCV: 92.7 fL (ref 80.0–100.0)
Monocytes Absolute: 1 K/uL (ref 0.1–1.0)
Monocytes Relative: 9 %
Neutro Abs: 9.1 K/uL — ABNORMAL HIGH (ref 1.7–7.7)
Neutrophils Relative %: 77 %
Platelets: 202 K/uL (ref 150–400)
RBC: 2.89 MIL/uL — ABNORMAL LOW (ref 4.22–5.81)
RDW: 14.5 % (ref 11.5–15.5)
WBC: 11.8 K/uL — ABNORMAL HIGH (ref 4.0–10.5)
nRBC: 0 % (ref 0.0–0.2)

## 2023-09-19 LAB — MAGNESIUM: Magnesium: 2.1 mg/dL (ref 1.7–2.4)

## 2023-09-19 MED ORDER — ENSURE PLUS HIGH PROTEIN PO LIQD
237.0000 mL | Freq: Four times a day (QID) | ORAL | Status: DC
  Administered 2023-09-19 – 2023-09-20 (×5): 237 mL via ORAL

## 2023-09-19 NOTE — Progress Notes (Signed)
 [facility]  PROGRESS NOTE  [name] FMW:[id number] DOB: 11/28/1950 DOA: 09/17/2023 PCP: Donata Snowman, PA-C   Brief HPI:    73 y.o. 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  On presentation, hemoglobin was 10, WBC of 11.9.  ENT was consulted.    Assessment/Plan:   Bleeding from tongue cancer lesion Squamous cell cancer of the base of the tongue with recurrence Recurrent head and neck cancer Goals of care -Followed by oncology, ENT, palliative care and radiation oncology as an outpatient. - Patient presented with bleeding from his tongue lesion.  Bleeding seems to have stopped.  Hemoglobin currently stable.  Monitor H&H.  Transfuse as needed - ENT evaluation appreciated: ENT has consulted IR for possible embolization.  ENT also recommended oncology consultation.    Overall prognosis is guarded to poor.  As per prior notes: Hospice was also suggested but patient/family not ready for the same.  Palliative care consulted.   Leukocytosis - Monitor   Anemia of chronic disease - From chronic illnesses and cancer.  Hemoglobin has remained stable.  Acute blood loss anemia has been ruled out.  Monitor -Follow H&H in a.m.   Chronic hypotension ?  Primary adrenal insufficiency - Continue dexamethasone  and fludrocortisone .   Failure to thrive Poor oral intake - Continue dronabinol .   Chronic renal disease stage IIIa - Stable   History of cirrhosis of liver - Outpatient  follow-up with PCP and GI   Hypoalbuminemia - From poor oral intake.  Consult nutrition   Hyperlipidemia Continue statin   Hypothyroidism - Continue levothyroxine    History of seizure disorder - Continue Keppra    Grade 1 diastolic dysfunction - No signs of volume overload.   Physical deconditioning - PT eval   Patient was recently started on Kelfex on 09/16/23 by PCP as per daughter (not sure of the reason). Daughter requests Kelfex to be resumed.  Keflex  resumed.. Patient can follow up with PCP regarding the same.    Medications     cephALEXin   500 mg Oral QID   cyanocobalamin   1,000 mcg Oral Daily   dexamethasone   0.75 mg Oral TID   dronabinol   5 mg Oral BID AC   feeding supplement  237 mL Oral BID BM   finasteride   5 mg Oral Daily   fludrocortisone   0.1 mg Oral Daily   folic acid   1 mg Oral Daily   levETIRAcetam   500 mg Oral BID   levothyroxine   75 mcg Oral Q0600   mouth rinse  15 mL Mouth Rinse 4 times per day   rosuvastatin   5 mg Oral QHS     Data Reviewed:   CBG:  No results for input(s): GLUCAP in the last 168 hours.  SpO2: 94 %    Vitals:   09/18/23 1949 09/18/23 1953 09/19/23 0458 09/19/23 0906  BP: (!) 145/85  105/70   Pulse: 62 64 68   Resp: 18 16 18    Temp: 98 F (36.7 C)  98.6 F (37 C)   TempSrc: Oral  Oral   SpO2: 100% 100% 100% 94%  Weight:  Height:          Data Reviewed:  Basic Metabolic Panel: Recent Labs  Lab 09/17/23 1058 09/18/23 0551 09/19/23 0621  NA 137 139 135  K 3.5 3.5 3.6  CL 103 107 99  CO2 25 25 29   GLUCOSE 106* 112* 125*  BUN 38* 41* 39*  CREATININE 0.87 0.77 0.72  CALCIUM  7.9* 7.8* 7.9*  MG  --   --  2.1    CBC: Recent Labs  Lab 09/17/23 1058 09/17/23 1712 09/17/23 2225 09/18/23 0551 09/18/23 1920 09/19/23 0621  WBC 11.9*  --   --  14.5*  --  11.8*  NEUTROABS 9.3*  --   --   --   --  9.1*  HGB 10.0* 10.1* 9.5* 9.3* 9.8* 8.4*  HCT 31.8* 32.0* 30.1* 30.4* 30.7* 26.8*  MCV 91.6  --    --  94.1  --  92.7  PLT 239  --   --  228  --  202    LFT Recent Labs  Lab 09/18/23 0551 09/19/23 0621  AST 9* 16  ALT 10 10  ALKPHOS 55 59  BILITOT 1.4* 0.7  PROT 5.7* 5.6*  ALBUMIN 1.9* 1.8*     Antibiotics: Anti-infectives (From admission, onward)    Start     Dose/Rate Route Frequency Ordered Stop   09/18/23 1800  cephALEXin  (KEFLEX ) capsule 500 mg        500 mg Oral 4 times daily 09/18/23 1622          DVT prophylaxis: SCDs  Code Status: Full code  Family Communication:    CONSULTS    Subjective   Denies bleeding from tongue.   Objective    Physical Examination:   Appears in no acute distress S1-S2, regular Lungs clear to auscultation bilaterally Mouth-no visible bleeding noted from tongue   Status is: Inpatient:             [name]   [facility] If 7PM-7AM, please contact night-coverage at [email or web address], Office  [phone]   09/19/2023, 9:08 AM  LOS: 0 days

## 2023-09-19 NOTE — Progress Notes (Signed)
 Initial Nutrition Assessment  DOCUMENTATION CODES:   Severe malnutrition in context of chronic illness, Underweight  INTERVENTION:   -Ensure Plus High Protein po BID, each supplement provides 350 kcal and 20 grams of protein. Room temperature per patient request.  -Magic cup BID with meals, each supplement provides 290 kcal and 9 grams of protein  -If patient unable to maintain PO intakes, consider PEG placement if within GOC    NUTRITION DIAGNOSIS:   Severe Malnutrition related to chronic illness, cancer and cancer related treatments as evidenced by severe fat depletion, severe muscle depletion, percent weight loss.  GOAL:   Patient will meet greater than or equal to 90% of their needs  MONITOR:   PO intake, Supplement acceptance  REASON FOR ASSESSMENT:   Consult Assessment of nutrition requirement/status  ASSESSMENT:   73 y.o. 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  Patient in room, nonverbal but able to nod yes or no and writes to communicate. Pt reports his appetite has not been good d/t having mouth and throat pain. He states both liquids and solids cause pain. Currently has lunch order at bedside but cannot eat spaghetti and broccoli. Requesting Ensure at room temperature so provided one during visit. Will increase order to QID and pt also wanting to try ice cream, will order Magic cups.  Pt with history of G-tube, may need replaced if unable to maintain PO intakes  consistently. GOC TBD, palliative care consulted.  Per weight records, pt has lost 21 lbs since 03/13/23 (20% wt loss x 6 months, significant for time frame).  Medications: Vitamin B-12, Marinol , Folic acid   Labs reviewed.  NUTRITION - FOCUSED PHYSICAL EXAM:  Flowsheet Row Most Recent Value  Orbital Region Severe depletion  Upper Arm Region Severe depletion  Thoracic and Lumbar Region Severe depletion  Buccal Region Severe depletion  Temple Region Severe depletion  Clavicle Bone Region Severe depletion  Clavicle and Acromion Bone Region Severe depletion  Scapular Bone Region Severe depletion  Dorsal Hand Severe depletion  Patellar Region Severe depletion  Anterior Thigh Region Severe depletion  Posterior Calf Region Severe depletion  Edema (RD Assessment) None  Hair Reviewed  Eyes Reviewed  Mouth Reviewed  [edentulous]  Skin Reviewed  Nails Reviewed    Diet Order:   Diet Order             Diet regular Fluid consistency: Thin; Fluid restriction: 1200 mL Fluid  Diet effective now                   EDUCATION NEEDS:   No education needs have been identified at this time  Skin:  Skin Assessment: Reviewed RN Assessment  Last BM:  PTA  Height:   Ht Readings from Last 1 Encounters:  09/17/23 5' 11 (1.803 m)    Weight:   Wt Readings from Last 1 Encounters:  09/17/23 37.2 kg    BMI:  Body mass index is 11.44 kg/m.  Estimated Nutritional Needs:   Kcal:  1700-1900  Protein:  85-100g  Fluid:  1.7L/day   Morna Lee, MS, RD, LDN Inpatient Clinical Dietitian  Contact via Secure chat

## 2023-09-19 NOTE — Plan of Care (Signed)

## 2023-09-19 NOTE — Consult Note (Signed)
 Consultation Note Date: 09/19/2023   Patient Name: [name]  DOB: Dec 27, 1950  MRN: [id number]  Age / Sex: 73 y.o., male   PCP: Donata Snowman, PA-C Referring Physician: Drusilla Sabas RAMAN, MD  Reason for Consultation: Establishing goals of care     Chief Complaint/History of Present Illness:   Patient is a 73 year old male with a past medical history of alcohol  abuse in remission, right hip vascular necrosis, CAD, liver cirrhosis, CKD, hyperlipidemia, hypertension, hypothyroidism, laryngeal cancer status post total laryngectomy in 2011, subarachnoid hemorrhage, seizure disorder, urinary retention, 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  Patient has recurrence of SCC of the base of his tongue.  ENT consulted for recommendations and evaluation.  ENT recommended IR for possible embolization.  Palliative medicine team consulted to assist with complex medical decision making. Of note patient known to PMT as follows with outpatient PMT at [facility].  Patient was most recently seen on 09/10/2023.  Extensive review of EMR prior to presenting to bedside.  Reviewed recent documentation from hospitalist, IR, and ENT.  At this time patient's bleeding has ceased and hemoglobin is being monitored closely.  Recent CBC noting WBC 11.8 and hemoglobin 8.4.  Review of recent CMP noted BUN 39, creatinine 0.72, GFR > 60, and albumin 1.8. Discussed care with outpatient PMT, hospitalist, and radiation oncologist to coordinate care.  Patient is supposed to have outpatient PET scan to determine if he is a candidate for further radiation therapy. At time of EMR review of the past 24 hours patient has received as needed oxycodone  5 mg x 2 doses.  Presented to bedside to see patient.  No visitors  present at bedside.  Patient is hard of hearing though able to communicate with speaking loudly.  Introduced myself as a member of the palliative medicine team and my role in patient's medical care.  Patient is familiar with palliative medicine from the outpatient setting.  Patient notes that he is having some pain in his mouth currently though feels the as needed oxycodone  works to manage this.  He does not feel the dose needs to be adjusted at this time. Inquired about medical planning moving forward.  Patient and his daughter have had discussions with multiple medical providers regarding care moving forward.  Patient wants to continue with aggressive medical interventions that are offered.  Patient notes that if he is a candidate for radiation therapy, he would want to continue it.  Patient and family have been reluctant to discuss de-escalation of care though open if patient continues to worsen.  Acknowledged patient's wishes.  Discussed patient would need to follow-up in outpatient setting with radiation oncology to determine if he is a candidate for further radiation after PET scan obtained.  Patient acknowledged this.  Spent time providing emotional support via active listening.  All questions answered at that time.  Noted palliative medicine team will continue to follow along with patient's medical journey.  Discussed care with hospitalist, RN, outpatient PMT, and radiation oncology to coordinate care.  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, laryngeal aspect (HCC)  Chronic kidney disease, stage 3a (HCC)  Hypothyroidism  Malignant neoplasm of base of tongue (HCC)  Primary adrenal insufficiency (HCC)  ABLA (acute blood loss  anemia)   Palliative Review of Systems: Pain managed with current medications  Past Medical History:  Diagnosis Date   Alcohol  abuse    Arthritis    Avascular necrosis of hip, right  (HCC) 08/09/2016   Carotid artery disease (HCC)    CTO LICA, 40-59% RICA 05/2022 US    CHF (congestive heart failure) (HCC)    EF 15-20%, normal coronaries 12/28/11; EF 50-55% 08/2022   Cirrhosis of liver (HCC)    CKD (chronic kidney disease)    Complication of anesthesia    SUCCINYLCHOLINE ADVERSE REACTION (not an allergy) Following 1st attempt to place tracheotomy tube in 2011 at [facility]; patient went into succinylcholine induced pulseless electrical activity secondary to probable hyperkalemia and underwent chest compressions and placement of endotracheal tube with admission to medical ICU.  EXTREMELY HIGH SEVERE REACTION.    Hyperlipidemia    Hypertension    Hypothyroidism    Laryngeal cancer (HCC) 2011   Laryngectomy, T3N0M0 and mouth cancer   Pneumonia    SAH (subarachnoid hemorrhage) (HCC) 07/20/2019   right frontal lobe [facility] 07/20/19   Seizures (HCC)    Urinary retention 05/2022   Social History   Socioeconomic History   Marital status: Widowed    Spouse name: Not on file   Number of children: 3   Years of education: 19   Highest education level: High school graduate  Occupational History   Occupation: retired  Tobacco Use   Smoking status: Former    Current packs/day: 0.00    Average packs/day: 0.2 packs/day for 50.0 years (10.0 ttl pk-yrs)    Types: Cigarettes    Start date: 07/10/1960    Quit date: 07/11/2010    Years since quitting: 13.2   Smokeless tobacco: Former    Types: Chew    Quit date: 1990  Vaping Use   Vaping status: Never Used  Substance and Sexual Activity   Alcohol  use: Not Currently    Alcohol /week: 2.0 standard drinks of alcohol     Types: 2 Cans of beer per week    Comment: Liquor- I glass per week, last drink 2023   Drug use: Not Currently    Types: Marijuana    Comment: last used 2011   Sexual activity: Not Currently  Other Topics Concern   Not on file  Social History Narrative   Patient lives with his daughter in [place].     Patient is cared for by family members. Sister- Gustav Dryer- Daughters Fiance.    Patient enjoys being outside, watching TV, spending time with family and cooking.    Patient does not drive. No transportation needs at this time.    Social Drivers of Corporate investment banker Strain: Low Risk  (03/08/2023)   Received from [facility]   Overall Financial Resource Strain (CARDIA)    Difficulty of Paying Living Expenses: Not hard at all  Food Insecurity: No Food Insecurity (09/18/2023)   Hunger Vital Sign    Worried About Running Out of Food in the Last Year: Never true    Ran Out of Food in the Last Year: Never true  Transportation Needs: No Transportation Needs (09/18/2023)   PRAPARE - Administrator, Civil Service (Medical): No    Lack of Transportation (Non-Medical): No  Physical Activity: Unknown (03/08/2023)   Received from [facility]   Exercise Vital Sign    On average, how many days per week do you engage in moderate to strenuous exercise (like a brisk walk)?: 0  days    Minutes of Exercise per Session: Not on file  Stress: No Stress Concern Present (03/08/2023)   Received from [facility] - Occupational Stress Questionnaire    Feeling of Stress : Not at all  Social Connections: Socially Isolated (09/18/2023)   Social Connection and Isolation Panel    Frequency of Communication with Friends and Family: Never    Frequency of Social Gatherings with Friends and Family: More than three times a week    Attends Religious Services: Never    Database administrator or Organizations: No    Attends Banker Meetings: Never    Marital Status: Widowed   Family History  Problem Relation Age of Onset   Hyperlipidemia Mother    Hypertension Mother    Hypertension Father    Hyperlipidemia Father    Lung cancer Father    Congestive Heart Failure Sister    Hypertension Sister    Cirrhosis Brother    Liver disease  Brother    Liver cancer Brother    Liver cancer Brother    Asthma Daughter    Heart murmur Daughter    Anemia Daughter    Asthma Daughter    HIV Daughter    Anemia Daughter    Scheduled Meds:  cephALEXin   500 mg Oral QID   cyanocobalamin   1,000 mcg Oral Daily   dexamethasone   0.75 mg Oral TID   dronabinol   5 mg Oral BID AC   feeding supplement  237 mL Oral BID BM   finasteride   5 mg Oral Daily   fludrocortisone   0.1 mg Oral Daily   folic acid   1 mg Oral Daily   levETIRAcetam   500 mg Oral BID   levothyroxine   75 mcg Oral Q0600   mouth rinse  15 mL Mouth Rinse 4 times per day   rosuvastatin   5 mg Oral QHS   Continuous Infusions: PRN Meds:.ketorolac , magic mouthwash, ondansetron  **OR** ondansetron  (ZOFRAN ) IV, mouth rinse, oxyCODONE  Allergies  Allergen Reactions   Other Other (See Comments)    Unknown anesthesia medicine CAUSED CARDIAC ARREST!!!!!!!!!!!!!! Succinylcholine SUSPECTED   Succinylcholine Other (See Comments)    ADVERSE REACTION (not an allergy) Following 1st attempt to place tracheotomy tube in 2011 at [facility]; patient went into succinylcholine induced pulseless electrical activity secondary to probable hyperkalemia and underwent chest compressions and placement of endotracheal tube with admission to medical ICU.  EXTREMELY HIGH SEVERE REACTION.    Tylenol  [Acetaminophen ] Other (See Comments) and Hypertension    HBP -Liver Cirrhosis   Vicodin [Hydrocodone-Acetaminophen ] Anaphylaxis, Swelling and Rash   Tamsulosin  Other (See Comments)    Caused B/P to drop (avoid due to orthostatic hypotension)   Oxycodone  Other (See Comments)    Syncope because of DROP IN B/P and MUST BE MONITORED IF THIS IS GIVEN   Protonix  [Pantoprazole ] Swelling and Rash   CBC:    Component Value Date/Time   WBC 11.8 (H) 09/19/2023 0621   HGB 8.4 (L) 09/19/2023 0621   HGB 10.4 (L) 02/01/2021 1421   HGB 12.4 (L) 11/04/2009 1559   HCT 26.8 (L) 09/19/2023 0621   HCT 31.6 (L)  02/01/2021 1421   HCT 38.0 (L) 11/04/2009 1559   PLT 202 09/19/2023 0621   PLT 190 02/01/2021 1421   MCV 92.7 09/19/2023 0621   MCV 88 02/01/2021 1421   MCV 91.2 11/04/2009 1559   NEUTROABS 9.1 (H) 09/19/2023 0621   NEUTROABS 1.6 11/04/2009 1559  LYMPHSABS 1.5 09/19/2023 0621   LYMPHSABS 2.1 11/04/2009 1559   MONOABS 1.0 09/19/2023 0621   MONOABS 0.4 11/04/2009 1559   EOSABS 0.0 09/19/2023 0621   EOSABS 0.1 11/04/2009 1559   BASOSABS 0.0 09/19/2023 0621   BASOSABS 0.0 11/04/2009 1559   Comprehensive Metabolic Panel:    Component Value Date/Time   NA 135 09/19/2023 0621   NA 137 02/01/2021 1421   K 3.6 09/19/2023 0621   CL 99 09/19/2023 0621   CL 103 08/21/2014 0000   CO2 29 09/19/2023 0621   BUN 39 (H) 09/19/2023 0621   BUN 15 02/01/2021 1421   CREATININE 0.72 09/19/2023 0621   CREATININE 0.65 07/28/2014 1144   GLUCOSE 125 (H) 09/19/2023 0621   CALCIUM  7.9 (L) 09/19/2023 0621   CALCIUM  8.9 08/21/2014 0000   AST 16 09/19/2023 0621   ALT 10 09/19/2023 0621   ALKPHOS 59 09/19/2023 0621   BILITOT 0.7 09/19/2023 0621   BILITOT 1.0 02/01/2021 1421   PROT 5.6 (L) 09/19/2023 0621   PROT 8.9 (H) 02/01/2021 1421   PROT 7.3 08/21/2014 0000   ALBUMIN 1.8 (L) 09/19/2023 0621   ALBUMIN 4.5 02/01/2021 1421   ALBUMIN 3.3 08/21/2014 0000    Physical Exam: Vital Signs: BP 105/70 (BP Location: Left Arm)   Pulse 68   Temp 98.6 F (37 C) (Oral)   Resp 18   Ht 5' 11 (1.803 m)   Wt 37.2 kg   SpO2 94%   BMI 11.44 kg/m  SpO2: SpO2: 94 % O2 Device: O2 Device: Room Air O2 Flow Rate:   Intake/output summary:  Intake/Output Summary (Last 24 hours) at 09/19/2023 9046 Last data filed at 09/19/2023 0931 Gross per 24 hour  Intake --  Output 100 ml  Net -100 ml   LBM: Last BM Date :  (new admit) Baseline Weight: Weight: 42.6 kg Most recent weight: Weight: 37.2 kg  General: NAD, awake, chronically ill-appearing, cachectic, frail Cardiovascular: RRR Respiratory: no increased  work of breathing noted, not in respiratory distress Neuro: Awake, interactive          Palliative Performance Scale: 4 Best of luck 0%              Additional Data Reviewed: Recent Labs    09/18/23 0551 09/18/23 1920 09/19/23 0621  WBC 14.5*  --  11.8*  HGB 9.3* 9.8* 8.4*  PLT 228  --  202  NA 139  --  135  BUN 41*  --  39*  CREATININE 0.77  --  0.72    Imaging: AMBULATORY EEG Gregg Lek, MD     08/13/2023  4:38 PM  Clinical History:  This is a 73 y/o M who presents for follow up seizure activity in  the setting of automatic dysfunction vs convulsive syncope as  episodes primarily occur with position changes.   Study Start Date/Time:  07/31/2023 1255 Study End Date/Time:  08/03/2023 0902   INTERMITTENT MONITORING with VIDEO TECHNICAL SUMMARY:  This AVEEG was performed using equipment provided by [employer]  utilizing Bluetooth ( Trackit ) amplifiers with continuous EEGT  attended video collection using encrypted remote transmission via  [employer] secured cellular tower network with data rates  for each AVEEG performed. This is a Therapist, music AVEEG,  obtained, according to the 10-20 international electrode  placement system, reformatted digitally into referential and  bipolar montages. Data was acquired with a minimum of 21 bipolar  connections and sampled at a minimum rate of 250 cycles per  second per channel, maximum rate of 450 cycles per second per  channel and two channels for EKG. The entire VEEG study was  recorded through cable and or radio telemetry for subsequent  analysis. Specified epochs of the AVEEG data were identified at  the direction of the subject by the depression of a push button  by the patient. Each patients event file included data acquired  two minutes prior to the push button activation and continuing  until two minutes afterwards. AVEEG files were reviewed on [employer] [facility] server, Licensed Software provided by   [employer] with a digital high frequency filter set at 70 Hz and a  low frequency filter set at 1 Hz with a paper speed of 44mm/s  resulting in 10 seconds per digital page. This entire AVEEG was  reviewed by the EEG Technologist. Random time samples, random  sleep samples, clips, patient initiated push button files with  included patient daily diary logs, EEG Technologist pruned data  was reviewed and verified for accuracy and validity by the  governing reading neurologist in full details. This AEEGV was  fully compliant with all requirements for CPT 97500 for setup,  patient education, take down and administered by an EEG  technologist.   Long-Term EEG with Video was monitored intermittently by a  qualified EEG technologist for the entirety of the recording;  quality check-ins were performed at a minimum of every two hours,  checking and documenting real-time data and video to assure the  integrity and quality of the recording (e.g., camera position,  electrode integrity and impedance), and identify the need for  maintenance. For intermittent monitoring, an EEG Technologist  monitored no more than 12 patients concurrently. Diagnostic video  was captured at least 80% of the time during the recording.   PATIENT EVENTS:  A button press or notation was not made during this study.   TECHNOLOGIST EVENTS:  No clear epileptiform activity was detected by the reviewing  neurodiagnostic technologist during the recording for further  evaluation.   TIME SAMPLES:  10-minutes of every two hours recorded are reviewed as random  time samples.   SLEEP SAMPLES:  5-minutes of every 24 hour recorded sleep cycle are reviewed as  random sleep samples.   AWAKE:  At maximal level of alertness, the posterior dominant background  activity was continuous, reactive, low voltage rhythm of 9 Hz.  This was symmetric, well-modulated, and attenuated with eye  opening. Diffuse, symmetric, frontocentral beta  range activity  was present.   SLEEP:  N1 Sleep (Stage 1) was observed and characterized by the  disappearance of alpha rhythm and the appearance of vertex  activity.  N2 Sleep (Stage 2) was observed and characterized by vertex  waves, K-complexes, and sleep spindles.   N3 (Stage 3) sleep was observed and characterized by high  amplitude Delta activity of 20%.   REM sleep was observed.   EKG:  There were no arrhythmias or abnormalities noted during this  recording.   Impression:  Normal EEG: awake and asleep   Clinical Correlation:  This is a normal 3-day ambulatory EEG tracing. No focal  abnormalities or epileptiform discharges were seen. There were no  electrographic seizures noted. No events were captured during the  recording. Please note a normal EEG does not exclude the  diagnosis of epilepsy.  Pastor Falling, MD [facility]     I personally reviewed recent imaging.   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  Patient has recurrence of SCC of the base of his tongue.  ENT consulted for recommendations and evaluation.  ENT recommended IR for possible embolization.  Palliative medicine team consulted to assist with complex medical decision making. Of note patient known to PMT as follows with outpatient PMT at [facility].  Patient was  most recently seen on 09/10/2023.  # Complex medical decision making/goals of care  - Discussed care with patient as detailed above in HPI.  Patient (and daughter during prior conversations) have continued to express they would like to treat the treatable aligned very opportunity for patient to thrive.  They have been provided education regarding hospice in the outpatient setting and have declined this noting they will let the medical team know when they are in agreement of hospice support at home.  Patient noted he would want to follow-up in outpatient setting with radiation oncology to determine if he is a candidate for further radiation which he would accept.  Discussed care with Dr. Izell who noted patient would need to obtain outpatient PET scan for her to determine if further radiation would be offered.  Palliative medicine to continue to engage in conversations as able and appropriate.  -  Code Status: Full Code   # Symptom management Patient is receiving these palliative interventions for symptom management with an intent to improve quality of life.   - Pain, in setting of SCC of the tongue   - Continue oxycodone  5 mg every 4 hours as needed.  Noted patient has had hypotension associated with oxycodone  so continue to monitor vitals closely.  # Psycho-social/Spiritual Support:  - Support System: Daughter  # Discharge Planning:  Home with Palliative Services  Thank you for allowing the palliative care team to participate in the care [facility].  Tinnie Radar, DO Palliative Care Provider PMT # [phone]  If patient remains symptomatic despite maximum doses, please call PMT at [phone] between 0700 and 1900. Outside of these hours, please call attending, as PMT does not have night coverage.  Personally spent 60 minutes in patient care including extensive chart review (labs, imaging, progress/consult notes, vital signs), medically appropraite exam, discussed with  treatment team, education to patient, family, and staff, documenting clinical information, medication review and management, coordination of care, and available advanced directive documents.

## 2023-09-20 ENCOUNTER — Encounter

## 2023-09-20 DIAGNOSIS — N1831 Chronic kidney disease, stage 3a: Secondary | ICD-10-CM | POA: Diagnosis not present

## 2023-09-20 DIAGNOSIS — D62 Acute posthemorrhagic anemia: Secondary | ICD-10-CM | POA: Diagnosis not present

## 2023-09-20 DIAGNOSIS — C01 Malignant neoplasm of base of tongue: Secondary | ICD-10-CM | POA: Diagnosis not present

## 2023-09-20 DIAGNOSIS — K1379 Other lesions of oral mucosa: Secondary | ICD-10-CM | POA: Diagnosis not present

## 2023-09-20 LAB — COMPREHENSIVE METABOLIC PANEL WITH GFR
ALT: 17 U/L (ref 0–44)
AST: 21 U/L (ref 15–41)
Albumin: 2.2 g/dL — ABNORMAL LOW (ref 3.5–5.0)
Alkaline Phosphatase: 67 U/L (ref 38–126)
Anion gap: 10 (ref 5–15)
BUN: 42 mg/dL — ABNORMAL HIGH (ref 8–23)
CO2: 29 mmol/L (ref 22–32)
Calcium: 8.5 mg/dL — ABNORMAL LOW (ref 8.9–10.3)
Chloride: 98 mmol/L (ref 98–111)
Creatinine, Ser: 0.9 mg/dL (ref 0.61–1.24)
GFR, Estimated: >60 mL/min (ref >60)
Glucose, Bld: 117 mg/dL — ABNORMAL HIGH (ref 70–99)
Potassium: 4.2 mmol/L (ref 3.5–5.1)
Sodium: 137 mmol/L (ref 135–145)
Total Bilirubin: 0.4 mg/dL (ref 0.0–1.2)
Total Protein: 6.4 g/dL — ABNORMAL LOW (ref 6.5–8.1)

## 2023-09-20 LAB — CBC
HCT: 28.5 % — ABNORMAL LOW (ref 39.0–52.0)
Hemoglobin: 8.8 g/dL — ABNORMAL LOW (ref 13.0–17.0)
MCH: 28.8 pg (ref 26.0–34.0)
MCHC: 30.9 g/dL (ref 30.0–36.0)
MCV: 93.1 fL (ref 80.0–100.0)
Platelets: 234 K/uL (ref 150–400)
RBC: 3.06 MIL/uL — ABNORMAL LOW (ref 4.22–5.81)
RDW: 14.4 % (ref 11.5–15.5)
WBC: 11.3 K/uL — ABNORMAL HIGH (ref 4.0–10.5)
nRBC: 0 % (ref 0.0–0.2)

## 2023-09-20 MED ORDER — OXYCODONE HCL 5 MG PO TABS: 2.5000 mg | ORAL_TABLET | Freq: Four times a day (QID) | ORAL | Status: DC | PRN

## 2023-09-20 MED ORDER — LEVETIRACETAM 500 MG PO TABS: 500.0000 mg | ORAL_TABLET | Freq: Two times a day (BID) | ORAL | Status: DC

## 2023-09-20 NOTE — TOC Transition Note (Signed)
 Transition of Care [facility]) - Discharge Note   Patient Details  Name: [name] MRN: [id number] Date of Birth: 03/31/1950  [facility] CM/SW Contact:  Toy LITTIE Agar, RN Phone Number:[phone]  09/20/2023, 3:36 PM   Clinical Narrative:    Patient with discharge orders. CM spoke with daughter Clotilda who confirms that patient has HH through [facility] and would like to resume. CM has updated Lynette with [employer]. Orders are in AVS updated. No other TOC needs. TOC will sign off.    Final next level of care: Home w Home Health Services Barriers to Discharge: No Barriers Identified   Patient Goals and CMS Choice   CMS Medicare.gov Compare Post Acute Care list provided to:: Patient Represenative (must comment) (daughter) Choice offered to / list presented to : Adult Children, Patient [place] ownership interest in [facility].provided to:: Adult Children    Discharge Placement                       Discharge Plan and Services Additional resources added to the After Visit Summary for                  DME Arranged: N/A DME Agency: NA       HH Arranged: PT HH Agency: [facility] Date HH Agency Contacted: 09/20/23 Time HH Agency Contacted: 1536 Representative spoke with at [facility] Agency: Arna  Social Drivers of Health (SDOH) Interventions SDOH Screenings   Food Insecurity: No Food Insecurity (09/18/2023)  Housing: Low Risk  (09/18/2023)  Transportation Needs: No Transportation Needs (09/18/2023)  Utilities: Not At Risk (09/18/2023)  Depression (PHQ2-9): Low Risk  (06/13/2023)  Financial Resource Strain: Low Risk  (03/08/2023)   Received from [facility]  Physical Activity: Unknown (03/08/2023)   Received from [facility]  Social Connections: Socially Isolated (09/18/2023)  Stress: No Stress Concern Present (03/08/2023)   Received from [facility]  Tobacco Use: Medium Risk (09/17/2023)     Readmission Risk Interventions      No data to display

## 2023-09-20 NOTE — Plan of Care (Signed)

## 2023-09-20 NOTE — Discharge Summary (Signed)
 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 *  Hospital Course:  73 y.o. 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  On presentation, hemoglobin was 10, WBC of 11.9.  ENT was consulted.   Assessment and Plan:  Bleeding from tongue cancer lesion Squamous cell cancer of the base of the tongue with recurrence Recurrent head and neck cancer Goals of care -Followed by oncology, ENT, palliative care and radiation oncology as an outpatient. - Patient presented with bleeding from his tongue lesion.  Bleeding seems to have stopped.  Hemoglobin  currently stable.  Monitor H&H.  Transfuse as needed - ENT evaluation appreciated: ENT has consulted IR for possible embolization.    Overall prognosis is guarded to poor.  As per prior notes: Hospice was also suggested but patient/family not ready for the same.  Palliative care consulted. - Patient to follow-up as outpatient with radiation oncology for PET scan and possible radiation treatment.      Anemia of chronic disease - From chronic illnesses and cancer.  Hemoglobin has remained stable.  Acute blood loss anemia has been ruled out.  H&H is stable   Chronic hypotension ?  Primary adrenal insufficiency - Continue dexamethasone  and fludrocortisone .   Failure to thrive Poor oral intake - Continue dronabinol .   Chronic renal disease stage IIIa - Stable   History of cirrhosis of liver - Outpatient follow-up with PCP and GI   Hypoalbuminemia - From poor oral intake.  Consult nutrition   Hyperlipidemia Continue statin   Hypothyroidism - Continue levothyroxine    History of seizure disorder - Continue Keppra    Grade 1 diastolic dysfunction - No signs of volume overload.   Physical deconditioning - PT eval obtained - Patient to go home with home health PT   Patient was recently started on Kelfex on 09/16/23 by PCP as per daughter (not sure of the reason). Daughter requests Kelfex to be resumed.  Keflex  resumed.. Patient can follow up with PCP regarding the same.        Consultants: Palliative care, ENT Procedures performed:  Disposition: Home Diet recommendation:  Discharge Diet Orders (From admission, onward)     Start     Ordered   09/20/23 0000  Diet - low sodium  heart healthy        09/20/23 1512           Regular diet DISCHARGE MEDICATION: Allergies as of 09/20/2023       Reactions   Other Other (See Comments)   Unknown anesthesia medicine CAUSED CARDIAC ARREST!!!!!!!!!!!!!! Succinylcholine SUSPECTED   Succinylcholine Other (See Comments)    ADVERSE REACTION (not an allergy) Following 1st attempt to place tracheotomy tube in 2011 at [facility]; patient went into succinylcholine induced pulseless electrical activity secondary to probable hyperkalemia and underwent chest compressions and placement of endotracheal tube with admission to medical ICU.  EXTREMELY HIGH SEVERE REACTION.    Tylenol  [acetaminophen ] Other (See Comments), Hypertension   HBP -Liver Cirrhosis   Vicodin [hydrocodone-acetaminophen ] Anaphylaxis, Swelling, Rash   Tamsulosin  Other (See Comments)   Caused B/P to drop (avoid due to orthostatic hypotension)   Oxycodone  Other (See Comments)   Syncope because of DROP IN B/P and MUST BE MONITORED IF THIS IS GIVEN   Protonix  [pantoprazole ] Swelling, Rash        Medication List     STOP taking these medications    ibuprofen  100 MG/5ML suspension Commonly known as: ADVIL        TAKE these medications    cephALEXin  500 MG capsule Commonly known as: KEFLEX  Take 500 mg by mouth 4 (four) times daily.   cyanocobalamin  500 MCG tablet Commonly known as: VITAMIN B12 Take 2 tablets (1,000 mcg total) by mouth daily.   dexamethasone  0.75 MG tablet Commonly known as: DECADRON  Take 0.75 mg by mouth 3 (three) times daily.   diclofenac  Sodium 1 % Gel Commonly known as: VOLTAREN  Apply 2 g topically 2 (two) times daily as needed (pain).   dronabinol  5 MG capsule Commonly known as: MARINOL  Take 1 capsule (5 mg total) by mouth 2 (two) times daily before lunch and supper.   finasteride  5 MG tablet Commonly known as: PROSCAR  Take 1 tablet (5 mg total) by mouth daily.   fludrocortisone  0.1 MG tablet Commonly known as: FLORINEF  Take 0.05-0.1 mg by mouth See admin instructions. Take 0.1 mg by mouth on Sun/Tues/Thurs/Sat and 0.05 mg on Mon/Wed/Fri   folic acid  1 MG tablet Commonly known as: FOLVITE  Take 1 mg by mouth daily.   levETIRAcetam  100 MG/ML solution Commonly known as: Keppra  Take 5 mLs (500 mg  total) by mouth 2 (two) times daily.   levothyroxine  75 MCG tablet Commonly known as: SYNTHROID  Take 75 mcg by mouth daily before breakfast.   magic mouthwash Soln Take 5 mLs by mouth 4 (four) times daily as needed (swish and spit).   NEOMYCIN-POLYMYXIN-HYDROCORTISONE  1 % Soln OTIC solution Commonly known as: CORTISPORIN Place 4 drops into the left ear 3 (three) times daily as needed (Irritation).   oxyCODONE  5 MG/5ML solution Commonly known as: ROXICODONE  Take 2.5-5 mLs (2.5-5 mg total) by mouth every 6 (six) hours as needed for severe pain (pain score 7-10). What changed: reasons to take this   rosuvastatin  5 MG tablet Commonly known as: CRESTOR  Take 5 mg by mouth at bedtime.        Follow-up Information     Izell Domino, MD. Schedule an appointment as soon as possible for a visit.   Specialty: Radiation Oncology Contact information: [address] [place] [address] [phone]                Discharge Exam: Filed Weights   09/17/23 0941 09/17/23 2300  Weight: 42.6 kg 37.2 kg   General-appears in  no acute distress Heart-S1-S2, regular, no murmur auscultated Lungs-clear to auscultation bilaterally, no wheezing or crackles auscultated Abdomen-soft, nontender, no organomegaly Extremities-no edema in the lower extremities Neuro-alert, oriented x3, no focal deficit noted  Condition at discharge: good  The results of significant diagnostics from this hospitalization (including imaging, microbiology, ancillary and laboratory) are listed below for reference.   Imaging Studies: No results found.  Microbiology: Results for orders placed or performed during the hospital encounter of 09/17/23  MRSA Next Gen by PCR, Nasal     Status: None   Collection Time: 09/18/23  3:37 AM   Specimen: Nasal Mucosa; Nasal Swab  Result Value Ref Range Status   MRSA by PCR Next Gen NOT DETECTED NOT DETECTED Final    Comment: (NOTE) The GeneXpert MRSA Assay (FDA  approved for NASAL specimens only), is one component of a comprehensive MRSA colonization surveillance program. It is not intended to diagnose MRSA infection nor to guide or monitor treatment for MRSA infections. Test performance is not FDA approved in patients less than 74 years old. Performed at [facility], [address], [place], [place] [address]     Labs: CBC: Recent Labs  Lab 09/17/23 1058 09/17/23 1712 09/17/23 2225 09/18/23 0551 09/18/23 1920 09/19/23 0621 09/20/23 1341  WBC 11.9*  --   --  14.5*  --  11.8* 11.3*  NEUTROABS 9.3*  --   --   --   --  9.1*  --   HGB 10.0*   < > 9.5* 9.3* 9.8* 8.4* 8.8*  HCT 31.8*   < > 30.1* 30.4* 30.7* 26.8* 28.5*  MCV 91.6  --   --  94.1  --  92.7 93.1  PLT 239  --   --  228  --  202 234   < > = values in this interval not displayed.   Basic Metabolic Panel: Recent Labs  Lab 09/17/23 1058 09/18/23 0551 09/19/23 0621 09/20/23 1341  NA 137 139 135 137  K 3.5 3.5 3.6 4.2  CL 103 107 99 98  CO2 25 25 29 29   GLUCOSE 106* 112* 125* 117*  BUN 38* 41* 39* 42*  CREATININE 0.87 0.77 0.72 0.90  CALCIUM  7.9* 7.8* 7.9* 8.5*  MG  --   --  2.1  --    Liver Function Tests: Recent Labs  Lab 09/18/23 0551 09/19/23 0621 09/20/23 1341  AST 9* 16 21  ALT 10 10 17   ALKPHOS 55 59 67  BILITOT 1.4* 0.7 0.4  PROT 5.7* 5.6* 6.4*  ALBUMIN 1.9* 1.8* 2.2*   CBG: No results for input(s): GLUCAP in the last 168 hours.  Discharge time spent: greater than 30 minutes.  Signed: Sabas GORMAN Brod, MD [facility] 09/20/2023

## 2023-09-20 NOTE — Progress Notes (Incomplete)
 [facility]  PROGRESS NOTE  [name] FMW:[id number] DOB: 1950/11/27 DOA: 09/17/2023 PCP: Donata Snowman, PA-C   Brief HPI:    73 y.o. 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  On presentation, hemoglobin was 10, WBC of 11.9.  ENT was consulted.    Assessment/Plan:   Bleeding from tongue cancer lesion Squamous cell cancer of the base of the tongue with recurrence Recurrent head and neck cancer Goals of care -Followed by oncology, ENT, palliative care and radiation oncology as an outpatient. - Patient presented with bleeding from his tongue lesion.  Bleeding seems to have stopped.  Hemoglobin currently stable.  Monitor H&H.  Transfuse as needed - ENT evaluation appreciated: ENT has consulted IR for possible embolization.  ENT also recommended oncology consultation.    Overall prognosis is guarded to poor.  As per prior notes: Hospice was also suggested but patient/family not ready for the same.  Palliative care consulted.   Leukocytosis - Monitor   Anemia of chronic disease - From chronic illnesses and cancer.  Hemoglobin has remained stable.  Acute blood loss anemia has been ruled out.  Monitor -Follow H&H in a.m.   Chronic hypotension ?  Primary adrenal insufficiency - Continue dexamethasone  and fludrocortisone .   Failure to thrive Poor oral intake - Continue dronabinol .   Chronic renal disease stage IIIa - Stable   History of cirrhosis of liver - Outpatient  follow-up with PCP and GI   Hypoalbuminemia - From poor oral intake.  Consult nutrition   Hyperlipidemia Continue statin   Hypothyroidism - Continue levothyroxine    History of seizure disorder - Continue Keppra    Grade 1 diastolic dysfunction - No signs of volume overload.   Physical deconditioning - PT eval   Patient was recently started on Kelfex on 09/16/23 by PCP as per daughter (not sure of the reason). Daughter requests Kelfex to be resumed.  Keflex  resumed.. Patient can follow up with PCP regarding the same.    Medications     cephALEXin   500 mg Oral QID   cyanocobalamin   1,000 mcg Oral Daily   dexamethasone   0.75 mg Oral TID   dronabinol   5 mg Oral BID AC   feeding supplement  237 mL Oral QID   finasteride   5 mg Oral Daily   fludrocortisone   0.1 mg Oral Daily   folic acid   1 mg Oral Daily   levETIRAcetam   500 mg Oral BID   levothyroxine   75 mcg Oral Q0600   mouth rinse  15 mL Mouth Rinse 4 times per day   rosuvastatin   5 mg Oral QHS     Data Reviewed:   CBG:  No results for input(s): GLUCAP in the last 168 hours.  SpO2: 99 % FiO2 (%): 21 %    Vitals:   09/19/23 2007 09/19/23 2049 09/20/23 0445 09/20/23 0735  BP:  99/65 119/76   Pulse: 70 (!) 57 61   Resp: 16 14 16    Temp:  98.5 F (36.9 C) 98.5 F (36.9 C)   TempSrc:  Oral Oral   SpO2: 99% 99% 100% 99%  Weight:  Height:          Data Reviewed:  Basic Metabolic Panel: Recent Labs  Lab 09/17/23 1058 09/18/23 0551 09/19/23 0621  NA 137 139 135  K 3.5 3.5 3.6  CL 103 107 99  CO2 25 25 29   GLUCOSE 106* 112* 125*  BUN 38* 41* 39*  CREATININE 0.87 0.77 0.72  CALCIUM  7.9* 7.8* 7.9*  MG  --   --  2.1    CBC: Recent Labs  Lab 09/17/23 1058 09/17/23 1712 09/17/23 2225 09/18/23 0551 09/18/23 1920 09/19/23 0621  WBC 11.9*  --   --  14.5*  --  11.8*  NEUTROABS 9.3*  --   --   --   --  9.1*  HGB 10.0* 10.1* 9.5* 9.3* 9.8* 8.4*  HCT 31.8* 32.0* 30.1* 30.4* 30.7* 26.8*   MCV 91.6  --   --  94.1  --  92.7  PLT 239  --   --  228  --  202    LFT Recent Labs  Lab 09/18/23 0551 09/19/23 0621  AST 9* 16  ALT 10 10  ALKPHOS 55 59  BILITOT 1.4* 0.7  PROT 5.7* 5.6*  ALBUMIN 1.9* 1.8*     Antibiotics: Anti-infectives (From admission, onward)    Start     Dose/Rate Route Frequency Ordered Stop   09/18/23 1800  cephALEXin  (KEFLEX ) capsule 500 mg        500 mg Oral 4 times daily 09/18/23 1622          DVT prophylaxis: SCDs  Code Status: Full code  Family Communication:    CONSULTS    Subjective      Objective    Physical Examination:    Status is: Inpatient:             [name]   [facility] If 7PM-7AM, please contact night-coverage at [email or web address], Office  [phone]   09/20/2023, 8:46 AM  LOS: 1 day

## 2023-09-23 ENCOUNTER — Inpatient Hospital Stay (HOSPITAL_COMMUNITY): Admitting: Anesthesiology

## 2023-09-23 ENCOUNTER — Inpatient Hospital Stay (HOSPITAL_COMMUNITY): Admission: EM | Admit: 2023-09-23 | Discharge: 2023-10-05 | DRG: 144 | Disposition: E

## 2023-09-23 ENCOUNTER — Encounter (HOSPITAL_COMMUNITY): Payer: Self-pay

## 2023-09-23 ENCOUNTER — Encounter (HOSPITAL_COMMUNITY): Admission: EM | Disposition: E | Payer: Self-pay | Source: Home / Self Care | Attending: Internal Medicine

## 2023-09-23 ENCOUNTER — Inpatient Hospital Stay (HOSPITAL_COMMUNITY)

## 2023-09-23 ENCOUNTER — Other Ambulatory Visit: Payer: Self-pay

## 2023-09-23 DIAGNOSIS — I130 Hypertensive heart and chronic kidney disease with heart failure and stage 1 through stage 4 chronic kidney disease, or unspecified chronic kidney disease: Secondary | ICD-10-CM | POA: Diagnosis present

## 2023-09-23 DIAGNOSIS — N1831 Chronic kidney disease, stage 3a: Secondary | ICD-10-CM | POA: Diagnosis not present

## 2023-09-23 DIAGNOSIS — Z681 Body mass index (BMI) 19 or less, adult: Secondary | ICD-10-CM | POA: Diagnosis not present

## 2023-09-23 DIAGNOSIS — N189 Chronic kidney disease, unspecified: Secondary | ICD-10-CM | POA: Diagnosis present

## 2023-09-23 DIAGNOSIS — R569 Unspecified convulsions: Secondary | ICD-10-CM | POA: Diagnosis present

## 2023-09-23 DIAGNOSIS — Z886 Allergy status to analgesic agent status: Secondary | ICD-10-CM

## 2023-09-23 DIAGNOSIS — Z8379 Family history of other diseases of the digestive system: Secondary | ICD-10-CM

## 2023-09-23 DIAGNOSIS — I5022 Chronic systolic (congestive) heart failure: Secondary | ICD-10-CM

## 2023-09-23 DIAGNOSIS — K1379 Other lesions of oral mucosa: Secondary | ICD-10-CM | POA: Diagnosis not present

## 2023-09-23 DIAGNOSIS — E890 Postprocedural hypothyroidism: Secondary | ICD-10-CM | POA: Diagnosis present

## 2023-09-23 DIAGNOSIS — Z930 Tracheostomy status: Secondary | ICD-10-CM

## 2023-09-23 DIAGNOSIS — E2740 Unspecified adrenocortical insufficiency: Secondary | ICD-10-CM | POA: Diagnosis present

## 2023-09-23 DIAGNOSIS — R58 Hemorrhage, not elsewhere classified: Secondary | ICD-10-CM

## 2023-09-23 DIAGNOSIS — I739 Peripheral vascular disease, unspecified: Secondary | ICD-10-CM | POA: Diagnosis present

## 2023-09-23 DIAGNOSIS — Z7401 Bed confinement status: Secondary | ICD-10-CM | POA: Diagnosis not present

## 2023-09-23 DIAGNOSIS — R000 Tachycardia, unspecified: Secondary | ICD-10-CM | POA: Diagnosis present

## 2023-09-23 DIAGNOSIS — D649 Anemia, unspecified: Secondary | ICD-10-CM | POA: Diagnosis present

## 2023-09-23 DIAGNOSIS — K7460 Unspecified cirrhosis of liver: Secondary | ICD-10-CM | POA: Diagnosis present

## 2023-09-23 DIAGNOSIS — Z830 Family history of human immunodeficiency virus [HIV] disease: Secondary | ICD-10-CM

## 2023-09-23 DIAGNOSIS — R578 Other shock: Secondary | ICD-10-CM | POA: Diagnosis not present

## 2023-09-23 DIAGNOSIS — Z923 Personal history of irradiation: Secondary | ICD-10-CM

## 2023-09-23 DIAGNOSIS — C01 Malignant neoplasm of base of tongue: Principal | ICD-10-CM | POA: Diagnosis present

## 2023-09-23 DIAGNOSIS — Z8521 Personal history of malignant neoplasm of larynx: Secondary | ICD-10-CM

## 2023-09-23 DIAGNOSIS — Z96641 Presence of right artificial hip joint: Secondary | ICD-10-CM | POA: Diagnosis present

## 2023-09-23 DIAGNOSIS — D509 Iron deficiency anemia, unspecified: Secondary | ICD-10-CM | POA: Diagnosis present

## 2023-09-23 DIAGNOSIS — Z8249 Family history of ischemic heart disease and other diseases of the circulatory system: Secondary | ICD-10-CM | POA: Diagnosis not present

## 2023-09-23 DIAGNOSIS — I9589 Other hypotension: Secondary | ICD-10-CM | POA: Diagnosis present

## 2023-09-23 DIAGNOSIS — G4489 Other headache syndrome: Secondary | ICD-10-CM | POA: Diagnosis not present

## 2023-09-23 DIAGNOSIS — Z9002 Acquired absence of larynx: Secondary | ICD-10-CM

## 2023-09-23 DIAGNOSIS — R041 Hemorrhage from throat: Secondary | ICD-10-CM | POA: Diagnosis present

## 2023-09-23 DIAGNOSIS — Z79890 Hormone replacement therapy (postmenopausal): Secondary | ICD-10-CM

## 2023-09-23 DIAGNOSIS — E785 Hyperlipidemia, unspecified: Secondary | ICD-10-CM | POA: Diagnosis present

## 2023-09-23 DIAGNOSIS — Z83438 Family history of other disorder of lipoprotein metabolism and other lipidemia: Secondary | ICD-10-CM

## 2023-09-23 DIAGNOSIS — Z8674 Personal history of sudden cardiac arrest: Secondary | ICD-10-CM

## 2023-09-23 DIAGNOSIS — Z825 Family history of asthma and other chronic lower respiratory diseases: Secondary | ICD-10-CM

## 2023-09-23 DIAGNOSIS — Z9221 Personal history of antineoplastic chemotherapy: Secondary | ICD-10-CM | POA: Diagnosis not present

## 2023-09-23 DIAGNOSIS — Z800 Family history of malignant neoplasm of digestive organs: Secondary | ICD-10-CM

## 2023-09-23 DIAGNOSIS — I720 Aneurysm of carotid artery: Secondary | ICD-10-CM

## 2023-09-23 DIAGNOSIS — Z885 Allergy status to narcotic agent status: Secondary | ICD-10-CM | POA: Diagnosis not present

## 2023-09-23 DIAGNOSIS — Z888 Allergy status to other drugs, medicaments and biological substances status: Secondary | ICD-10-CM

## 2023-09-23 DIAGNOSIS — D62 Acute posthemorrhagic anemia: Secondary | ICD-10-CM | POA: Diagnosis present

## 2023-09-23 DIAGNOSIS — R64 Cachexia: Secondary | ICD-10-CM | POA: Diagnosis present

## 2023-09-23 DIAGNOSIS — I6522 Occlusion and stenosis of left carotid artery: Secondary | ICD-10-CM | POA: Diagnosis present

## 2023-09-23 DIAGNOSIS — Z801 Family history of malignant neoplasm of trachea, bronchus and lung: Secondary | ICD-10-CM

## 2023-09-23 DIAGNOSIS — Z87891 Personal history of nicotine dependence: Secondary | ICD-10-CM

## 2023-09-23 DIAGNOSIS — C140 Malignant neoplasm of pharynx, unspecified: Secondary | ICD-10-CM | POA: Diagnosis present

## 2023-09-23 DIAGNOSIS — Z832 Family history of diseases of the blood and blood-forming organs and certain disorders involving the immune mechanism: Secondary | ICD-10-CM

## 2023-09-23 DIAGNOSIS — Z85048 Personal history of other malignant neoplasm of rectum, rectosigmoid junction, and anus: Secondary | ICD-10-CM

## 2023-09-23 DIAGNOSIS — K920 Hematemesis: Secondary | ICD-10-CM | POA: Diagnosis present

## 2023-09-23 DIAGNOSIS — Z9189 Other specified personal risk factors, not elsewhere classified: Secondary | ICD-10-CM

## 2023-09-23 DIAGNOSIS — C029 Malignant neoplasm of tongue, unspecified: Secondary | ICD-10-CM | POA: Diagnosis present

## 2023-09-23 DIAGNOSIS — Z79899 Other long term (current) drug therapy: Secondary | ICD-10-CM

## 2023-09-23 LAB — BASIC METABOLIC PANEL WITH GFR
Anion gap: 9 (ref 5–15)
BUN: 28 mg/dL — ABNORMAL HIGH (ref 8–23)
CO2: 26 mmol/L (ref 22–32)
Calcium: 7.6 mg/dL — ABNORMAL LOW (ref 8.9–10.3)
Chloride: 101 mmol/L (ref 98–111)
Creatinine, Ser: 0.95 mg/dL (ref 0.61–1.24)
GFR, Estimated: >60 mL/min (ref >60)
Glucose, Bld: 109 mg/dL — ABNORMAL HIGH (ref 70–99)
Potassium: 3.7 mmol/L (ref 3.5–5.1)
Sodium: 136 mmol/L (ref 135–145)

## 2023-09-23 LAB — TYPE AND SCREEN
ABO/RH(D): A POS
Antibody Screen: NEGATIVE

## 2023-09-23 LAB — PROTIME-INR
INR: 1.2 (ref 0.8–1.2)
Prothrombin Time: 16 s — ABNORMAL HIGH (ref 11.4–15.2)

## 2023-09-23 LAB — CBC
HCT: 27 % — ABNORMAL LOW (ref 39.0–52.0)
Hemoglobin: 8.3 g/dL — ABNORMAL LOW (ref 13.0–17.0)
MCH: 28.8 pg (ref 26.0–34.0)
MCHC: 30.7 g/dL (ref 30.0–36.0)
MCV: 93.8 fL (ref 80.0–100.0)
Platelets: 254 K/uL (ref 150–400)
RBC: 2.88 MIL/uL — ABNORMAL LOW (ref 4.22–5.81)
RDW: 14.3 % (ref 11.5–15.5)
WBC: 14.5 K/uL — ABNORMAL HIGH (ref 4.0–10.5)
nRBC: 0 % (ref 0.0–0.2)

## 2023-09-23 LAB — CBC WITH DIFFERENTIAL/PLATELET
Abs Immature Granulocytes: 0.09 K/uL — ABNORMAL HIGH (ref 0.00–0.07)
Basophils Absolute: 0 K/uL (ref 0.0–0.1)
Basophils Relative: 0 %
Eosinophils Absolute: 0 K/uL (ref 0.0–0.5)
Eosinophils Relative: 0 %
HCT: 26.5 % — ABNORMAL LOW (ref 39.0–52.0)
Hemoglobin: 8 g/dL — ABNORMAL LOW (ref 13.0–17.0)
Immature Granulocytes: 1 %
Lymphocytes Relative: 19 %
Lymphs Abs: 2.1 K/uL (ref 0.7–4.0)
MCH: 28.9 pg (ref 26.0–34.0)
MCHC: 30.2 g/dL (ref 30.0–36.0)
MCV: 95.7 fL (ref 80.0–100.0)
Monocytes Absolute: 1 K/uL (ref 0.1–1.0)
Monocytes Relative: 9 %
Neutro Abs: 7.9 K/uL — ABNORMAL HIGH (ref 1.7–7.7)
Neutrophils Relative %: 71 %
Platelets: 263 K/uL (ref 150–400)
RBC: 2.77 MIL/uL — ABNORMAL LOW (ref 4.22–5.81)
RDW: 14.4 % (ref 11.5–15.5)
WBC: 11 K/uL — ABNORMAL HIGH (ref 4.0–10.5)
nRBC: 0 % (ref 0.0–0.2)

## 2023-09-23 LAB — GLUCOSE, CAPILLARY: Glucose-Capillary: 78 mg/dL (ref 70–99)

## 2023-09-23 LAB — HEMOGLOBIN AND HEMATOCRIT, BLOOD
HCT: 28.1 % — ABNORMAL LOW (ref 39.0–52.0)
Hemoglobin: 8.7 g/dL — ABNORMAL LOW (ref 13.0–17.0)

## 2023-09-23 LAB — PREPARE RBC (CROSSMATCH)

## 2023-09-23 LAB — MRSA NEXT GEN BY PCR, NASAL: MRSA by PCR Next Gen: NOT DETECTED

## 2023-09-23 SURGERY — RADIOLOGY WITH ANESTHESIA
Anesthesia: General

## 2023-09-23 MED ORDER — SODIUM CHLORIDE 0.9 % IV SOLN: INTRAVENOUS | Status: AC

## 2023-09-23 MED ORDER — FENTANYL CITRATE PF 50 MCG/ML IJ SOSY: 12.5000 ug | PREFILLED_SYRINGE | INTRAMUSCULAR | Status: DC | PRN

## 2023-09-23 MED ORDER — DEXAMETHASONE 0.5 MG PO TABS
0.7500 mg | ORAL_TABLET | Freq: Three times a day (TID) | ORAL | Status: DC
  Administered 2023-09-23: 0.75 mg via ORAL
  Filled 2023-09-23 (×2): qty 1
  Filled 2023-09-23 (×3): qty 1.5

## 2023-09-23 MED ORDER — IOHEXOL 300 MG/ML  SOLN
150.0000 mL | Freq: Once | INTRAMUSCULAR | Status: AC | PRN
  Administered 2023-09-24: 100 mL via INTRA_ARTERIAL

## 2023-09-23 MED ORDER — SODIUM CHLORIDE 0.9 % IV SOLN: INTRAVENOUS | Status: DC | PRN

## 2023-09-23 MED ORDER — SODIUM CHLORIDE 0.9% IV SOLUTION: Freq: Once | INTRAVENOUS | Status: DC

## 2023-09-23 MED ORDER — ONDANSETRON HCL 4 MG/2ML IJ SOLN: 4.0000 mg | Freq: Four times a day (QID) | INTRAMUSCULAR | Status: DC | PRN

## 2023-09-23 MED ORDER — ORAL CARE MOUTH RINSE: 15.0000 mL | OROMUCOSAL | Status: DC

## 2023-09-23 MED ORDER — LIDOCAINE HCL (PF) 1 % IJ SOLN
INTRAMUSCULAR | Status: AC
  Filled 2023-09-23: qty 30

## 2023-09-23 MED ORDER — ORAL CARE MOUTH RINSE: 15.0000 mL | OROMUCOSAL | Status: DC | PRN

## 2023-09-23 MED ORDER — FLUDROCORTISONE ACETATE 0.1 MG PO TABS: 0.0500 mg | ORAL_TABLET | ORAL | Status: DC

## 2023-09-23 MED ORDER — VASOPRESSIN 20 UNIT/ML IV SOLN
INTRAVENOUS | Status: DC | PRN
  Administered 2023-09-23: 4 [IU] via INTRAVENOUS
  Administered 2023-09-23 (×3): 2 [IU] via INTRAVENOUS

## 2023-09-23 MED ORDER — OXYCODONE HCL 5 MG/5ML PO SOLN: 2.5000 mg | Freq: Four times a day (QID) | ORAL | Status: DC | PRN

## 2023-09-23 MED ORDER — CEPHALEXIN 500 MG PO CAPS
500.0000 mg | ORAL_CAPSULE | Freq: Four times a day (QID) | ORAL | Status: DC
  Filled 2023-09-23: qty 1

## 2023-09-23 MED ORDER — SODIUM BICARBONATE 8.4 % IV SOLN
INTRAVENOUS | Status: DC | PRN
  Administered 2023-09-23: 50 mL via INTRAVENOUS

## 2023-09-23 MED ORDER — ROCURONIUM 10MG/ML (10ML) SYRINGE FOR MEDFUSION PUMP - OPTIME
INTRAVENOUS | Status: DC | PRN
Start: 2023-09-23 — End: 2023-09-24
  Administered 2023-09-23: 30 mg via INTRAVENOUS

## 2023-09-23 MED ORDER — TRAZODONE HCL 50 MG PO TABS: 25.0000 mg | ORAL_TABLET | Freq: Every evening | ORAL | Status: DC | PRN

## 2023-09-23 MED ORDER — FLUDROCORTISONE ACETATE 0.1 MG PO TABS
0.0500 mg | ORAL_TABLET | ORAL | Status: DC
  Filled 2023-09-23: qty 0.5

## 2023-09-23 MED ORDER — FENTANYL CITRATE PF 50 MCG/ML IJ SOSY
50.0000 ug | PREFILLED_SYRINGE | Freq: Once | INTRAMUSCULAR | Status: AC
  Administered 2023-09-23: 50 ug via INTRAVENOUS
  Filled 2023-09-23: qty 1

## 2023-09-23 MED ORDER — PHENYLEPHRINE HCL-NACL 20-0.9 MG/250ML-% IV SOLN
INTRAVENOUS | Status: DC | PRN
Start: 2023-09-23 — End: 2023-09-24
  Administered 2023-09-23: 40 ug/min via INTRAVENOUS

## 2023-09-23 MED ORDER — CHLORHEXIDINE GLUCONATE CLOTH 2 % EX PADS
6.0000 | MEDICATED_PAD | Freq: Every day | CUTANEOUS | Status: DC
  Administered 2023-09-23: 6 via TOPICAL

## 2023-09-23 MED ORDER — IBUPROFEN 200 MG PO TABS: 400.0000 mg | ORAL_TABLET | Freq: Four times a day (QID) | ORAL | Status: DC | PRN

## 2023-09-23 MED ORDER — LIDOCAINE HCL 1 % IJ SOLN
10.0000 mL | Freq: Once | INTRAMUSCULAR | Status: AC
  Administered 2023-09-24: 10 mL
  Filled 2023-09-23: qty 10

## 2023-09-23 MED ORDER — FENTANYL CITRATE (PF) 100 MCG/2ML IJ SOLN
INTRAMUSCULAR | Status: AC
  Filled 2023-09-23: qty 2

## 2023-09-23 MED ORDER — OXYCODONE HCL 5 MG/5ML PO SOLN: 5.0000 mg | Freq: Once | ORAL | Status: DC

## 2023-09-23 MED ORDER — CALCIUM CHLORIDE 10 % IV SOLN
INTRAVENOUS | Status: DC | PRN
Start: 2023-09-23 — End: 2023-09-24
  Administered 2023-09-23: 400 mg via INTRAVENOUS
  Administered 2023-09-23: 600 mg via INTRAVENOUS
  Administered 2023-09-23: 1000 mg via INTRAVENOUS

## 2023-09-23 MED ORDER — EPHEDRINE SULFATE (PRESSORS) 50 MG/ML IJ SOLN
INTRAMUSCULAR | Status: DC | PRN
  Administered 2023-09-23: 10 mg via INTRAVENOUS

## 2023-09-23 MED ORDER — MIDAZOLAM HCL 2 MG/2ML IJ SOLN
INTRAMUSCULAR | Status: AC
  Filled 2023-09-23: qty 2

## 2023-09-23 MED ORDER — ALBUMIN HUMAN 5 % IV SOLN: INTRAVENOUS | Status: DC | PRN

## 2023-09-23 MED ORDER — ROSUVASTATIN CALCIUM 5 MG PO TABS
5.0000 mg | ORAL_TABLET | Freq: Every day | ORAL | Status: DC
  Filled 2023-09-23: qty 1

## 2023-09-23 MED ORDER — SODIUM CHLORIDE 0.9 % IV SOLN
INTRAVENOUS | Status: DC | PRN
Start: 2023-09-23 — End: 2023-09-24

## 2023-09-23 MED ORDER — ALBUTEROL SULFATE (2.5 MG/3ML) 0.083% IN NEBU: 2.5000 mg | INHALATION_SOLUTION | RESPIRATORY_TRACT | Status: DC | PRN

## 2023-09-23 MED ORDER — LEVOTHYROXINE SODIUM 25 MCG PO TABS
75.0000 ug | ORAL_TABLET | Freq: Every day | ORAL | Status: DC
  Administered 2023-09-23: 75 ug via ORAL
  Filled 2023-09-23: qty 1

## 2023-09-23 MED ORDER — DEXMEDETOMIDINE HCL IN NACL 200 MCG/50ML IV SOLN
INTRAVENOUS | Status: DC | PRN
  Administered 2023-09-23: .2 ug/kg/h via INTRAVENOUS

## 2023-09-23 MED ORDER — MIDAZOLAM HCL 5 MG/5ML IJ SOLN
INTRAMUSCULAR | Status: DC | PRN
  Administered 2023-09-23 (×4): .5 mg via INTRAVENOUS

## 2023-09-23 MED ORDER — FLUDROCORTISONE ACETATE 0.1 MG PO TABS
0.1000 mg | ORAL_TABLET | ORAL | Status: DC
  Administered 2023-09-23: 0.1 mg via ORAL
  Filled 2023-09-23: qty 1

## 2023-09-23 MED ORDER — SODIUM CHLORIDE 0.9 % IV SOLN: Freq: Once | INTRAVENOUS | Status: AC

## 2023-09-23 MED ORDER — ONDANSETRON HCL 4 MG PO TABS: 4.0000 mg | ORAL_TABLET | Freq: Four times a day (QID) | ORAL | Status: DC | PRN

## 2023-09-23 MED ORDER — EPINEPHRINE PF 1 MG/ML IJ SOLN
INTRAMUSCULAR | Status: DC | PRN
  Administered 2023-09-23 (×4): 1 mg via INTRAVENOUS

## 2023-09-23 MED ORDER — DEXMEDETOMIDINE HCL IN NACL 400 MCG/100ML IV SOLN
INTRAVENOUS | Status: AC
  Filled 2023-09-23: qty 100

## 2023-09-24 ENCOUNTER — Other Ambulatory Visit (HOSPITAL_COMMUNITY): Payer: Self-pay | Admitting: Radiology

## 2023-09-24 ENCOUNTER — Encounter (HOSPITAL_COMMUNITY): Payer: Self-pay | Admitting: Interventional Radiology

## 2023-09-24 ENCOUNTER — Encounter (HOSPITAL_COMMUNITY): Admission: RE | Admit: 2023-09-24 | Source: Ambulatory Visit

## 2023-09-24 DIAGNOSIS — Z9189 Other specified personal risk factors, not elsewhere classified: Secondary | ICD-10-CM

## 2023-09-24 LAB — PREPARE FRESH FROZEN PLASMA
Unit division: 0
Unit division: 0

## 2023-09-24 LAB — BPAM FFP
Blood Product Expiration Date: 202507222359
Blood Product Expiration Date: 202507242359
Blood Product Expiration Date: 202507242359
Blood Product Expiration Date: 202507242359
ISSUE DATE / TIME: 202507202143
ISSUE DATE / TIME: 202507202143
ISSUE DATE / TIME: 202507202143
ISSUE DATE / TIME: 202507202143
Unit Type and Rh: 6200
Unit Type and Rh: 6200
Unit Type and Rh: 6200
Unit Type and Rh: 6200

## 2023-09-24 MED ORDER — IOHEXOL 300 MG/ML  SOLN
150.0000 mL | Freq: Once | INTRAMUSCULAR | Status: AC | PRN
  Administered 2023-09-24: 50 mL via INTRA_ARTERIAL

## 2023-09-24 NOTE — Transfer of Care (Signed)
 Immediate Anesthesia Transfer of Care Note  Patient: [name]  Procedure(s) Performed: RADIOLOGY WITH ANESTHESIA  Patient Location: expired  Anesthesia Type:expired  Level of Consciousness: unresponsive  Airway & Oxygen  Therapy: expired  Post-op Assessment: expired  Post vital signs: unstable  Last Vitals:  Vitals Value Taken Time  BP    Temp    Pulse    Resp    SpO2      Last Pain:  Vitals:   09/21/2023 2000  TempSrc:   PainSc: 0-No pain         Complications: No notable events documented.

## 2023-09-24 NOTE — Anesthesia Postprocedure Evaluation (Signed)
 Anesthesia Post Note  Patient: [name]  Procedure(s) Performed: RADIOLOGY WITH ANESTHESIA     Anesthesia Type: General Anesthetic complications: yes Comments: Pt expired in procedure   No notable events documented.  Last Vitals:  Vitals:   09/08/2023 2000 09/28/2023 2030  BP: 106/75 (!) 83/54  Pulse:  (!) 124  Resp: 20 (!) 24  Temp:    SpO2:  (!) 79%    Last Pain:  Vitals:   09/15/2023 2000  TempSrc:   PainSc: 0-No pain                 Norleen Pope

## 2023-09-25 ENCOUNTER — Other Ambulatory Visit (HOSPITAL_COMMUNITY): Payer: Self-pay | Admitting: Radiology

## 2023-09-25 DIAGNOSIS — R58 Hemorrhage, not elsewhere classified: Secondary | ICD-10-CM

## 2023-09-27 LAB — BPAM RBC
Blood Product Expiration Date: 202508162359
Blood Product Expiration Date: 202508162359
Blood Product Expiration Date: 202508162359
Blood Product Expiration Date: 202508172359
Blood Product Expiration Date: 202508172359
Blood Product Expiration Date: 202508172359
Blood Product Expiration Date: 202508172359
Blood Product Expiration Date: 202508172359
Blood Product Expiration Date: 202508172359
Blood Product Expiration Date: 202508172359
ISSUE DATE / TIME: 202507202110
ISSUE DATE / TIME: 202507202110
ISSUE DATE / TIME: 202507202143
ISSUE DATE / TIME: 202507202143
ISSUE DATE / TIME: 202507202207
ISSUE DATE / TIME: 202507202207
ISSUE DATE / TIME: 202507202207
ISSUE DATE / TIME: 202507202207
Unit Type and Rh: 6200
Unit Type and Rh: 6200
Unit Type and Rh: 6200
Unit Type and Rh: 6200
Unit Type and Rh: 6200
Unit Type and Rh: 6200
Unit Type and Rh: 6200
Unit Type and Rh: 6200
Unit Type and Rh: 6200
Unit Type and Rh: 6200

## 2023-09-27 LAB — TYPE AND SCREEN
ABO/RH(D): A POS
Antibody Screen: NEGATIVE
Unit division: 0
Unit division: 0
Unit division: 0
Unit division: 0
Unit division: 0
Unit division: 0
Unit division: 0
Unit division: 0
Unit division: 0
Unit division: 0

## 2023-09-28 ENCOUNTER — Ambulatory Visit

## 2023-09-28 ENCOUNTER — Ambulatory Visit: Admitting: Radiation Oncology

## 2023-09-29 NOTE — Discharge Summary (Signed)
 Physician Discharge Summary   Patient: [name] MRN: [id number] DOB: 1950/10/19  Admit date:     10-10-23  Discharge date: 09/24/2023  Discharge Physician: Orlin Fairly   PCP: Donata Snowman, PA-C   Recommendations at discharge:    Patient deceased  Discharge Diagnoses: Principal Problem:   Tongue cancer [facility])    Hospital Course: 73 y.o. male with medical history significant for hypothyroidism, Adrenal insufficiency, hyperlipidemia, laryngeal cancer status postresection with chemotherapy and radiation and s/p laryngectomy with stoma admitted to the hospital with recurrent oral bleeding.  He was just discharged from [facility] on 7/17 for the same issue, he was seen in consultation by interventional radiology however bleeding spontaneously stopped and plan was for him to follow-up as an outpatient with IR and ENT.  Unfortunately he started bleeding again last night, bright red blood coming out of his mouth, small amount out of his nose. Patient admitted October 10, 2023 and was moved from floor to ICU do to on going bleeding, ABLA and more monitoring and possible need for intubation.  His BP remained low.  Patient taken to IR emergently for on going bleeding. In the IR  for an embolization procedure and was actively bleeding.  Status post coil embolization of the pseudoaneurysms bilaterally with minimal flow postembolization residual on the left side. Due to significant bleeding patient became hypotensive, became pulseless and ACLS protocol initiated.  Unfortunately patient was not able to revive.  Time of death is 11:09 pm 10-Oct-2023.  Assessment and Plan: Deceased         Consultants: IR Procedures performed: Status post coil embolization of the pseudoaneurysms bilaterally with minimal flow postembolization residual on the left side.   Disposition: Morgue   DISCHARGE MEDICATION: N/A Allergies as of 09/24/2023       Reactions   Other Other (See Comments)   Unknown anesthesia  medicine CAUSED CARDIAC ARREST!!!!!!!!!!!!!! Succinylcholine SUSPECTED   Succinylcholine Other (See Comments)   ADVERSE REACTION (not an allergy) Following 1st attempt to place tracheotomy tube in 2011 at [facility]; patient went into succinylcholine induced pulseless electrical activity secondary to probable hyperkalemia and underwent chest compressions and placement of endotracheal tube with admission to medical ICU.  EXTREMELY HIGH SEVERE REACTION.    Tylenol  [acetaminophen ] Other (See Comments), Hypertension   HBP -Liver Cirrhosis   Vicodin [hydrocodone-acetaminophen ] Anaphylaxis, Swelling, Rash   Tamsulosin  Other (See Comments)   Caused B/P to drop (avoid due to orthostatic hypotension)   Oxycodone  Other (See Comments)   Syncope because of DROP IN B/P and MUST BE MONITORED IF THIS IS GIVEN   Protonix  [pantoprazole ] Swelling, Rash        Medication List     ASK your doctor about these medications    cephALEXin  500 MG capsule Commonly known as: KEFLEX  Take 500 mg by mouth 4 (four) times daily. Ask about: Should I take this medication?   dexamethasone  0.75 MG tablet Commonly known as: DECADRON  Take 0.75 mg by mouth 3 (three) times daily.   diclofenac  Sodium 1 % Gel Commonly known as: VOLTAREN  Apply 2 g topically 2 (two) times daily as needed (pain).   fludrocortisone  0.1 MG tablet Commonly known as: FLORINEF  Take 0.05-0.1 mg by mouth See admin instructions. Take 0.1 mg by mouth on Sun/Tues/Thurs/Sat and 0.05 mg on Mon/Wed/Fri   folic acid  1 MG tablet Commonly known as: FOLVITE  Take 1 mg by mouth daily.   levothyroxine  75 MCG tablet Commonly known as: SYNTHROID  Take 75 mcg by mouth daily before breakfast.  NEOMYCIN-POLYMYXIN-HYDROCORTISONE  1 % Soln OTIC solution Commonly known as: CORTISPORIN Place 4 drops into the left ear 3 (three) times daily as needed (Irritation).   rosuvastatin  5 MG tablet Commonly known as: CRESTOR  Take 5 mg by mouth at bedtime.          Discharge Exam: Filed Weights   28-Sep-2023 1920  Weight: 36.6 kg     Condition at discharge: Deceased  The results of significant diagnostics from this hospitalization (including imaging, microbiology, ancillary and laboratory) are listed below for reference.   Imaging Studies: IR ANGIO EXTERNAL CAROTID SEL EXT CAROTID BILAT MOD SED Result Date: 09/25/2023 CLINICAL DATA:  Recurrent episodes of copious oral hemorrhage. Past history of orolaryngeal cancer status post multiple surgeries, radiation treatment and chemotherapy. EXAM: IR ANGIO EXTERNAL CAROTID SEL EXT CAROTID BILAT MOD SED COMPARISON:  CT of the 11/14/2022 MEDICATIONS: Heparin  none units IV. No antibiotic was administered within 1 hour of the procedure. ANESTHESIA/SEDATION: General anesthesia. CONTRAST:  Omnipaque  300 approximately 250 mL. FLUOROSCOPY TIME:  Fluoroscopy Time: 51 minutes 36 seconds (572 mGy). COMPLICATIONS: None immediate. TECHNIQUE: Informed witnessed written consent was obtained from the patient's daughter after a thorough discussion of the procedural risks, benefits and alternatives. Risks of thromboembolic stroke, vessel injury at the site of entry, infection, bleeding, renal failure related to the contrast, and not limited to the above mentioned potential risks, were discussed in detail with the daughter over the phone. All questions were addressed. Maximal Sterile Barrier Technique was utilized including caps, mask, sterile gowns, sterile gloves, sterile drape, hand hygiene and skin antiseptic. A timeout was performed prior to the initiation of the procedure. The right groin was prepped and draped in the usual sterile fashion. Thereafter using modified Seldinger technique, transfemoral access into the right common femoral artery was obtained without difficulty. Over an 0.035 inch guidewire, a 7 French 65 cm Arrow neurovascular sheath was inserted. Through this, and also over an 0.035 inch guidewire, a 6 French  95 cm Simmons 2 Envoy guide catheter was advanced to the aortic arch region, and selectively positioned in the right common carotid artery and the left common carotid artery. Arteriograms were then obtained centered intracranially, and extracranially. Also performed was an initial left vertebral artery arteriogram using a 5 French diagnostic catheter. FINDINGS: Dominant left vertebral artery origin demonstrates mild stenosis at its origin. The vessel is seen to opacify to the cranial skull base with multiple focal areas of mild smooth irregularity associated with outpouching especially the distal 2/3. Patency is seen of the left vertebrobasilar junction and hypoplastic left posterior-inferior cerebellar artery. The basilar artery, the posterior cerebral arteries, the superior cerebellar arteries and the anterior-inferior cerebellar arteries demonstrate patency into the capillary and venous phases. Patency of the dural venous sinuses maintained on delayed venous phase. Multiple focal areas of smooth narrowing are evident in the distal basilar artery, the posterior cerebral arteries proximally and distally. Dominant artery demonstrates moderate tortuosity with acute angulation at its bifurcation with the subclavian and the right common carotid artery proximally. Focal areas of mild narrowing are evident in the distal 1/3 of the right subclavian artery. A hypoplastic right vertebral artery is seen projecting intracranially with multiple focal areas of irregularity associated with mild narrowing. Right common carotid arteriogram demonstrates irregularity of the distal 2/3 of the right common carotid artery associated with mild narrowing of the distal 1/3. The right external carotid artery demonstrates moderate narrowing associated with irregularity of the occipital artery, and the internal maxillary artery. An anteriorly protruding pouch  is seen with to and fro motion at the level of the previous surgical clips noted in  the region of the proximal right external carotid artery. The right internal carotid artery at the bulb demonstrates mild fusiform dilatation with a tapered stenosis of a significant degree at the cervical petrous junction extending into the proximal petrous segment. The right ICA petrous and cavernous segments are widely patent. Patency is seen of the right middle cerebral artery and the right anterior cerebral artery into the capillary and venous phases. Brisk cross-filling via the anterior communicating artery of the left anterior cerebral artery and left middle cerebral artery is noted. ENDOVASCULAR EMBOLIZATION OF THE RIGHT EXTERNAL CAROTID ARTERY SMALL PSEUDOANEURYSM, AND OF THE LARGE LEFT EXTERNAL CAROTID ARTERY PSEUDOANEURYSM USING BARE METAL COILS Through the 6 French Simmons 2 Envoy guide catheter in the right common carotid artery, an 017 150 cm Phenom 2 tip microcatheter was advanced over an 0.014 inch standard Aristotle micro guidewire and positioned in the proximal right external carotid artery carotid artery. Guidewire was removed. Good aspiration obtained from the hub of the microcatheter. A gentle control arteriogram performed through the microcatheter demonstrated safe positioning of the tip of the microcatheter. Coil embolization with bare metal coils was then performed from the distal proximal location occluding the origin of the right occipital artery, and the right internal maxillary artery proximally. There was complete stasis of contrast in the origin of the right external carotid artery without opacification of the external carotid artery branches. The 6 [place] Envoy guide catheter was then positioned in the left common carotid artery. Control angiogram performed demonstrated multiple focal areas of irregularity with moderate narrowing at the of the proximal left common carotid artery. Complete occlusion of the left internal carotid artery was also noted. Left external carotid artery  demonstrated a tapered severe stenosis leading up to a lobulated dense contrast presenting a pseudoaneurysm with extravasation noted projecting medially and anteriorly. An 017 microcatheter was then advanced quickly into the pseudoaneurysm over an 0.014 inch Aristotle micro guidewire. Multiple coils were then advanced into the pseudoaneurysm covered the point of extravasation with significant slowing. Occlusion was obtained of the left occipital artery, and the superficial temporal arteries. The external carotid artery distal to the pseudoaneurysm was also occluded with bare metal coils. A final control arteriogram performed through the left external carotid artery demonstrated dense packing of the pseudoaneurysm and of the surrounding branches. Due to the bleeding, patient's blood pressure required vasopressor support and IV boluses of fluids per anesthesia. He was noted to be pulseless. Full cardiopulmonary resuscitation was immediately started by the anesthesia team without success. Patient was pronounced deceased by the code blue team at 11:09 p.m. IMPRESSION: Status post endovascular embolization of pseudoaneurysm of the larger left external carotid with extravasation to near complete stasis with bare metal endovascular coils, and of the smaller right external carotid artery with bare metal endovascular coils to stasis. Extensive changes involving the right internal carotid artery extra cranially and intracranially, and of the posterior circulation likely related to a combination of previous radiation, and diffuse intracranial and extracranial atherosclerotic changes. PLAN: As per referring MD. INDICATION: Recurrent oro pharyngeal bleeding. History of rectal carcinoma with laryngectomy and radiation chemotherapy and radiation. Electronically Signed   By: Thyra Nash M.D.   On: 09/25/2023 08:05   IR Angiogram Follow Up Study Result Date: 09/25/2023 CLINICAL DATA:  Recurrent episodes of copious oral  hemorrhage. Past history of orolaryngeal cancer status post multiple surgeries, radiation treatment and chemotherapy.  EXAM: IR ANGIO EXTERNAL CAROTID SEL EXT CAROTID BILAT MOD SED COMPARISON:  CT of the 11/14/2022 MEDICATIONS: Heparin  none units IV. No antibiotic was administered within 1 hour of the procedure. ANESTHESIA/SEDATION: General anesthesia. CONTRAST:  Omnipaque  300 approximately 250 mL. FLUOROSCOPY TIME:  Fluoroscopy Time: 51 minutes 36 seconds (572 mGy). COMPLICATIONS: None immediate. TECHNIQUE: Informed witnessed written consent was obtained from the patient's daughter after a thorough discussion of the procedural risks, benefits and alternatives. Risks of thromboembolic stroke, vessel injury at the site of entry, infection, bleeding, renal failure related to the contrast, and not limited to the above mentioned potential risks, were discussed in detail with the daughter over the phone. All questions were addressed. Maximal Sterile Barrier Technique was utilized including caps, mask, sterile gowns, sterile gloves, sterile drape, hand hygiene and skin antiseptic. A timeout was performed prior to the initiation of the procedure. The right groin was prepped and draped in the usual sterile fashion. Thereafter using modified Seldinger technique, transfemoral access into the right common femoral artery was obtained without difficulty. Over an 0.035 inch guidewire, a 7 French 65 cm Arrow neurovascular sheath was inserted. Through this, and also over an 0.035 inch guidewire, a 6 French 95 cm Simmons 2 Envoy guide catheter was advanced to the aortic arch region, and selectively positioned in the right common carotid artery and the left common carotid artery. Arteriograms were then obtained centered intracranially, and extracranially. Also performed was an initial left vertebral artery arteriogram using a 5 French diagnostic catheter. FINDINGS: Dominant left vertebral artery origin demonstrates mild stenosis at its  origin. The vessel is seen to opacify to the cranial skull base with multiple focal areas of mild smooth irregularity associated with outpouching especially the distal 2/3. Patency is seen of the left vertebrobasilar junction and hypoplastic left posterior-inferior cerebellar artery. The basilar artery, the posterior cerebral arteries, the superior cerebellar arteries and the anterior-inferior cerebellar arteries demonstrate patency into the capillary and venous phases. Patency of the dural venous sinuses maintained on delayed venous phase. Multiple focal areas of smooth narrowing are evident in the distal basilar artery, the posterior cerebral arteries proximally and distally. Dominant artery demonstrates moderate tortuosity with acute angulation at its bifurcation with the subclavian and the right common carotid artery proximally. Focal areas of mild narrowing are evident in the distal 1/3 of the right subclavian artery. A hypoplastic right vertebral artery is seen projecting intracranially with multiple focal areas of irregularity associated with mild narrowing. Right common carotid arteriogram demonstrates irregularity of the distal 2/3 of the right common carotid artery associated with mild narrowing of the distal 1/3. The right external carotid artery demonstrates moderate narrowing associated with irregularity of the occipital artery, and the internal maxillary artery. An anteriorly protruding pouch is seen with to and fro motion at the level of the previous surgical clips noted in the region of the proximal right external carotid artery. The right internal carotid artery at the bulb demonstrates mild fusiform dilatation with a tapered stenosis of a significant degree at the cervical petrous junction extending into the proximal petrous segment. The right ICA petrous and cavernous segments are widely patent. Patency is seen of the right middle cerebral artery and the right anterior cerebral artery into the  capillary and venous phases. Brisk cross-filling via the anterior communicating artery of the left anterior cerebral artery and left middle cerebral artery is noted. ENDOVASCULAR EMBOLIZATION OF THE RIGHT EXTERNAL CAROTID ARTERY SMALL PSEUDOANEURYSM, AND OF THE LARGE LEFT EXTERNAL CAROTID ARTERY PSEUDOANEURYSM USING  BARE METAL COILS Through the 6 French Simmons 2 Envoy guide catheter in the right common carotid artery, an 017 150 cm Phenom 2 tip microcatheter was advanced over an 0.014 inch standard Aristotle micro guidewire and positioned in the proximal right external carotid artery carotid artery. Guidewire was removed. Good aspiration obtained from the hub of the microcatheter. A gentle control arteriogram performed through the microcatheter demonstrated safe positioning of the tip of the microcatheter. Coil embolization with bare metal coils was then performed from the distal proximal location occluding the origin of the right occipital artery, and the right internal maxillary artery proximally. There was complete stasis of contrast in the origin of the right external carotid artery without opacification of the external carotid artery branches. The 6 [place] Envoy guide catheter was then positioned in the left common carotid artery. Control angiogram performed demonstrated multiple focal areas of irregularity with moderate narrowing at the of the proximal left common carotid artery. Complete occlusion of the left internal carotid artery was also noted. Left external carotid artery demonstrated a tapered severe stenosis leading up to a lobulated dense contrast presenting a pseudoaneurysm with extravasation noted projecting medially and anteriorly. An 017 microcatheter was then advanced quickly into the pseudoaneurysm over an 0.014 inch Aristotle micro guidewire. Multiple coils were then advanced into the pseudoaneurysm covered the point of extravasation with significant slowing. Occlusion was obtained of the left  occipital artery, and the superficial temporal arteries. The external carotid artery distal to the pseudoaneurysm was also occluded with bare metal coils. A final control arteriogram performed through the left external carotid artery demonstrated dense packing of the pseudoaneurysm and of the surrounding branches. Due to the bleeding, patient's blood pressure required vasopressor support and IV boluses of fluids per anesthesia. He was noted to be pulseless. Full cardiopulmonary resuscitation was immediately started by the anesthesia team without success. Patient was pronounced deceased by the code blue team at 11:09 p.m. IMPRESSION: Status post endovascular embolization of pseudoaneurysm of the larger left external carotid with extravasation to near complete stasis with bare metal endovascular coils, and of the smaller right external carotid artery with bare metal endovascular coils to stasis. Extensive changes involving the right internal carotid artery extra cranially and intracranially, and of the posterior circulation likely related to a combination of previous radiation, and diffuse intracranial and extracranial atherosclerotic changes. PLAN: As per referring MD. INDICATION: Recurrent oro pharyngeal bleeding. History of rectal carcinoma with laryngectomy and radiation chemotherapy and radiation. Electronically Signed   By: Thyra Nash M.D.   On: 09/25/2023 08:05   IR Angiogram Follow Up Study Result Date: 09/25/2023 CLINICAL DATA:  Recurrent episodes of copious oral hemorrhage. Past history of orolaryngeal cancer status post multiple surgeries, radiation treatment and chemotherapy. EXAM: IR ANGIO EXTERNAL CAROTID SEL EXT CAROTID BILAT MOD SED COMPARISON:  CT of the 11/14/2022 MEDICATIONS: Heparin  none units IV. No antibiotic was administered within 1 hour of the procedure. ANESTHESIA/SEDATION: General anesthesia. CONTRAST:  Omnipaque  300 approximately 250 mL. FLUOROSCOPY TIME:  Fluoroscopy Time: 51  minutes 36 seconds (572 mGy). COMPLICATIONS: None immediate. TECHNIQUE: Informed witnessed written consent was obtained from the patient's daughter after a thorough discussion of the procedural risks, benefits and alternatives. Risks of thromboembolic stroke, vessel injury at the site of entry, infection, bleeding, renal failure related to the contrast, and not limited to the above mentioned potential risks, were discussed in detail with the daughter over the phone. All questions were addressed. Maximal Sterile Barrier Technique was utilized including caps, mask,  sterile gowns, sterile gloves, sterile drape, hand hygiene and skin antiseptic. A timeout was performed prior to the initiation of the procedure. The right groin was prepped and draped in the usual sterile fashion. Thereafter using modified Seldinger technique, transfemoral access into the right common femoral artery was obtained without difficulty. Over an 0.035 inch guidewire, a 7 French 65 cm Arrow neurovascular sheath was inserted. Through this, and also over an 0.035 inch guidewire, a 6 French 95 cm Simmons 2 Envoy guide catheter was advanced to the aortic arch region, and selectively positioned in the right common carotid artery and the left common carotid artery. Arteriograms were then obtained centered intracranially, and extracranially. Also performed was an initial left vertebral artery arteriogram using a 5 French diagnostic catheter. FINDINGS: Dominant left vertebral artery origin demonstrates mild stenosis at its origin. The vessel is seen to opacify to the cranial skull base with multiple focal areas of mild smooth irregularity associated with outpouching especially the distal 2/3. Patency is seen of the left vertebrobasilar junction and hypoplastic left posterior-inferior cerebellar artery. The basilar artery, the posterior cerebral arteries, the superior cerebellar arteries and the anterior-inferior cerebellar arteries demonstrate patency  into the capillary and venous phases. Patency of the dural venous sinuses maintained on delayed venous phase. Multiple focal areas of smooth narrowing are evident in the distal basilar artery, the posterior cerebral arteries proximally and distally. Dominant artery demonstrates moderate tortuosity with acute angulation at its bifurcation with the subclavian and the right common carotid artery proximally. Focal areas of mild narrowing are evident in the distal 1/3 of the right subclavian artery. A hypoplastic right vertebral artery is seen projecting intracranially with multiple focal areas of irregularity associated with mild narrowing. Right common carotid arteriogram demonstrates irregularity of the distal 2/3 of the right common carotid artery associated with mild narrowing of the distal 1/3. The right external carotid artery demonstrates moderate narrowing associated with irregularity of the occipital artery, and the internal maxillary artery. An anteriorly protruding pouch is seen with to and fro motion at the level of the previous surgical clips noted in the region of the proximal right external carotid artery. The right internal carotid artery at the bulb demonstrates mild fusiform dilatation with a tapered stenosis of a significant degree at the cervical petrous junction extending into the proximal petrous segment. The right ICA petrous and cavernous segments are widely patent. Patency is seen of the right middle cerebral artery and the right anterior cerebral artery into the capillary and venous phases. Brisk cross-filling via the anterior communicating artery of the left anterior cerebral artery and left middle cerebral artery is noted. ENDOVASCULAR EMBOLIZATION OF THE RIGHT EXTERNAL CAROTID ARTERY SMALL PSEUDOANEURYSM, AND OF THE LARGE LEFT EXTERNAL CAROTID ARTERY PSEUDOANEURYSM USING BARE METAL COILS Through the 6 French Simmons 2 Envoy guide catheter in the right common carotid artery, an 017 150 cm  Phenom 2 tip microcatheter was advanced over an 0.014 inch standard Aristotle micro guidewire and positioned in the proximal right external carotid artery carotid artery. Guidewire was removed. Good aspiration obtained from the hub of the microcatheter. A gentle control arteriogram performed through the microcatheter demonstrated safe positioning of the tip of the microcatheter. Coil embolization with bare metal coils was then performed from the distal proximal location occluding the origin of the right occipital artery, and the right internal maxillary artery proximally. There was complete stasis of contrast in the origin of the right external carotid artery without opacification of the external carotid artery branches. The  6 [place] Envoy guide catheter was then positioned in the left common carotid artery. Control angiogram performed demonstrated multiple focal areas of irregularity with moderate narrowing at the of the proximal left common carotid artery. Complete occlusion of the left internal carotid artery was also noted. Left external carotid artery demonstrated a tapered severe stenosis leading up to a lobulated dense contrast presenting a pseudoaneurysm with extravasation noted projecting medially and anteriorly. An 017 microcatheter was then advanced quickly into the pseudoaneurysm over an 0.014 inch Aristotle micro guidewire. Multiple coils were then advanced into the pseudoaneurysm covered the point of extravasation with significant slowing. Occlusion was obtained of the left occipital artery, and the superficial temporal arteries. The external carotid artery distal to the pseudoaneurysm was also occluded with bare metal coils. A final control arteriogram performed through the left external carotid artery demonstrated dense packing of the pseudoaneurysm and of the surrounding branches. Due to the bleeding, patient's blood pressure required vasopressor support and IV boluses of fluids per anesthesia. He  was noted to be pulseless. Full cardiopulmonary resuscitation was immediately started by the anesthesia team without success. Patient was pronounced deceased by the code blue team at 11:09 p.m. IMPRESSION: Status post endovascular embolization of pseudoaneurysm of the larger left external carotid with extravasation to near complete stasis with bare metal endovascular coils, and of the smaller right external carotid artery with bare metal endovascular coils to stasis. Extensive changes involving the right internal carotid artery extra cranially and intracranially, and of the posterior circulation likely related to a combination of previous radiation, and diffuse intracranial and extracranial atherosclerotic changes. PLAN: As per referring MD. INDICATION: Recurrent oro pharyngeal bleeding. History of rectal carcinoma with laryngectomy and radiation chemotherapy and radiation. Electronically Signed   By: Thyra Nash M.D.   On: 09/25/2023 08:05   IR Angiogram Follow Up Study Result Date: 09/25/2023 CLINICAL DATA:  Recurrent episodes of copious oral hemorrhage. Past history of orolaryngeal cancer status post multiple surgeries, radiation treatment and chemotherapy. EXAM: IR ANGIO EXTERNAL CAROTID SEL EXT CAROTID BILAT MOD SED COMPARISON:  CT of the 11/14/2022 MEDICATIONS: Heparin  none units IV. No antibiotic was administered within 1 hour of the procedure. ANESTHESIA/SEDATION: General anesthesia. CONTRAST:  Omnipaque  300 approximately 250 mL. FLUOROSCOPY TIME:  Fluoroscopy Time: 51 minutes 36 seconds (572 mGy). COMPLICATIONS: None immediate. TECHNIQUE: Informed witnessed written consent was obtained from the patient's daughter after a thorough discussion of the procedural risks, benefits and alternatives. Risks of thromboembolic stroke, vessel injury at the site of entry, infection, bleeding, renal failure related to the contrast, and not limited to the above mentioned potential risks, were discussed in detail  with the daughter over the phone. All questions were addressed. Maximal Sterile Barrier Technique was utilized including caps, mask, sterile gowns, sterile gloves, sterile drape, hand hygiene and skin antiseptic. A timeout was performed prior to the initiation of the procedure. The right groin was prepped and draped in the usual sterile fashion. Thereafter using modified Seldinger technique, transfemoral access into the right common femoral artery was obtained without difficulty. Over an 0.035 inch guidewire, a 7 French 65 cm Arrow neurovascular sheath was inserted. Through this, and also over an 0.035 inch guidewire, a 6 French 95 cm Simmons 2 Envoy guide catheter was advanced to the aortic arch region, and selectively positioned in the right common carotid artery and the left common carotid artery. Arteriograms were then obtained centered intracranially, and extracranially. Also performed was an initial left vertebral artery arteriogram using a 5 [place] diagnostic  catheter. FINDINGS: Dominant left vertebral artery origin demonstrates mild stenosis at its origin. The vessel is seen to opacify to the cranial skull base with multiple focal areas of mild smooth irregularity associated with outpouching especially the distal 2/3. Patency is seen of the left vertebrobasilar junction and hypoplastic left posterior-inferior cerebellar artery. The basilar artery, the posterior cerebral arteries, the superior cerebellar arteries and the anterior-inferior cerebellar arteries demonstrate patency into the capillary and venous phases. Patency of the dural venous sinuses maintained on delayed venous phase. Multiple focal areas of smooth narrowing are evident in the distal basilar artery, the posterior cerebral arteries proximally and distally. Dominant artery demonstrates moderate tortuosity with acute angulation at its bifurcation with the subclavian and the right common carotid artery proximally. Focal areas of mild narrowing  are evident in the distal 1/3 of the right subclavian artery. A hypoplastic right vertebral artery is seen projecting intracranially with multiple focal areas of irregularity associated with mild narrowing. Right common carotid arteriogram demonstrates irregularity of the distal 2/3 of the right common carotid artery associated with mild narrowing of the distal 1/3. The right external carotid artery demonstrates moderate narrowing associated with irregularity of the occipital artery, and the internal maxillary artery. An anteriorly protruding pouch is seen with to and fro motion at the level of the previous surgical clips noted in the region of the proximal right external carotid artery. The right internal carotid artery at the bulb demonstrates mild fusiform dilatation with a tapered stenosis of a significant degree at the cervical petrous junction extending into the proximal petrous segment. The right ICA petrous and cavernous segments are widely patent. Patency is seen of the right middle cerebral artery and the right anterior cerebral artery into the capillary and venous phases. Brisk cross-filling via the anterior communicating artery of the left anterior cerebral artery and left middle cerebral artery is noted. ENDOVASCULAR EMBOLIZATION OF THE RIGHT EXTERNAL CAROTID ARTERY SMALL PSEUDOANEURYSM, AND OF THE LARGE LEFT EXTERNAL CAROTID ARTERY PSEUDOANEURYSM USING BARE METAL COILS Through the 6 French Simmons 2 Envoy guide catheter in the right common carotid artery, an 017 150 cm Phenom 2 tip microcatheter was advanced over an 0.014 inch standard Aristotle micro guidewire and positioned in the proximal right external carotid artery carotid artery. Guidewire was removed. Good aspiration obtained from the hub of the microcatheter. A gentle control arteriogram performed through the microcatheter demonstrated safe positioning of the tip of the microcatheter. Coil embolization with bare metal coils was then performed  from the distal proximal location occluding the origin of the right occipital artery, and the right internal maxillary artery proximally. There was complete stasis of contrast in the origin of the right external carotid artery without opacification of the external carotid artery branches. The 6 [place] Envoy guide catheter was then positioned in the left common carotid artery. Control angiogram performed demonstrated multiple focal areas of irregularity with moderate narrowing at the of the proximal left common carotid artery. Complete occlusion of the left internal carotid artery was also noted. Left external carotid artery demonstrated a tapered severe stenosis leading up to a lobulated dense contrast presenting a pseudoaneurysm with extravasation noted projecting medially and anteriorly. An 017 microcatheter was then advanced quickly into the pseudoaneurysm over an 0.014 inch Aristotle micro guidewire. Multiple coils were then advanced into the pseudoaneurysm covered the point of extravasation with significant slowing. Occlusion was obtained of the left occipital artery, and the superficial temporal arteries. The external carotid artery distal to the pseudoaneurysm was also occluded  with bare metal coils. A final control arteriogram performed through the left external carotid artery demonstrated dense packing of the pseudoaneurysm and of the surrounding branches. Due to the bleeding, patient's blood pressure required vasopressor support and IV boluses of fluids per anesthesia. He was noted to be pulseless. Full cardiopulmonary resuscitation was immediately started by the anesthesia team without success. Patient was pronounced deceased by the code blue team at 11:09 p.m. IMPRESSION: Status post endovascular embolization of pseudoaneurysm of the larger left external carotid with extravasation to near complete stasis with bare metal endovascular coils, and of the smaller right external carotid artery with bare metal  endovascular coils to stasis. Extensive changes involving the right internal carotid artery extra cranially and intracranially, and of the posterior circulation likely related to a combination of previous radiation, and diffuse intracranial and extracranial atherosclerotic changes. PLAN: As per referring MD. INDICATION: Recurrent oro pharyngeal bleeding. History of rectal carcinoma with laryngectomy and radiation chemotherapy and radiation. Electronically Signed   By: Thyra Nash M.D.   On: 09/25/2023 08:05   IR Angiogram Follow Up Study Result Date: 09/25/2023 CLINICAL DATA:  Recurrent episodes of copious oral hemorrhage. Past history of orolaryngeal cancer status post multiple surgeries, radiation treatment and chemotherapy. EXAM: IR ANGIO EXTERNAL CAROTID SEL EXT CAROTID BILAT MOD SED COMPARISON:  CT of the 11/14/2022 MEDICATIONS: Heparin  none units IV. No antibiotic was administered within 1 hour of the procedure. ANESTHESIA/SEDATION: General anesthesia. CONTRAST:  Omnipaque  300 approximately 250 mL. FLUOROSCOPY TIME:  Fluoroscopy Time: 51 minutes 36 seconds (572 mGy). COMPLICATIONS: None immediate. TECHNIQUE: Informed witnessed written consent was obtained from the patient's daughter after a thorough discussion of the procedural risks, benefits and alternatives. Risks of thromboembolic stroke, vessel injury at the site of entry, infection, bleeding, renal failure related to the contrast, and not limited to the above mentioned potential risks, were discussed in detail with the daughter over the phone. All questions were addressed. Maximal Sterile Barrier Technique was utilized including caps, mask, sterile gowns, sterile gloves, sterile drape, hand hygiene and skin antiseptic. A timeout was performed prior to the initiation of the procedure. The right groin was prepped and draped in the usual sterile fashion. Thereafter using modified Seldinger technique, transfemoral access into the right common  femoral artery was obtained without difficulty. Over an 0.035 inch guidewire, a 7 French 65 cm Arrow neurovascular sheath was inserted. Through this, and also over an 0.035 inch guidewire, a 6 French 95 cm Simmons 2 Envoy guide catheter was advanced to the aortic arch region, and selectively positioned in the right common carotid artery and the left common carotid artery. Arteriograms were then obtained centered intracranially, and extracranially. Also performed was an initial left vertebral artery arteriogram using a 5 French diagnostic catheter. FINDINGS: Dominant left vertebral artery origin demonstrates mild stenosis at its origin. The vessel is seen to opacify to the cranial skull base with multiple focal areas of mild smooth irregularity associated with outpouching especially the distal 2/3. Patency is seen of the left vertebrobasilar junction and hypoplastic left posterior-inferior cerebellar artery. The basilar artery, the posterior cerebral arteries, the superior cerebellar arteries and the anterior-inferior cerebellar arteries demonstrate patency into the capillary and venous phases. Patency of the dural venous sinuses maintained on delayed venous phase. Multiple focal areas of smooth narrowing are evident in the distal basilar artery, the posterior cerebral arteries proximally and distally. Dominant artery demonstrates moderate tortuosity with acute angulation at its bifurcation with the subclavian and the right common carotid artery proximally.  Focal areas of mild narrowing are evident in the distal 1/3 of the right subclavian artery. A hypoplastic right vertebral artery is seen projecting intracranially with multiple focal areas of irregularity associated with mild narrowing. Right common carotid arteriogram demonstrates irregularity of the distal 2/3 of the right common carotid artery associated with mild narrowing of the distal 1/3. The right external carotid artery demonstrates moderate narrowing  associated with irregularity of the occipital artery, and the internal maxillary artery. An anteriorly protruding pouch is seen with to and fro motion at the level of the previous surgical clips noted in the region of the proximal right external carotid artery. The right internal carotid artery at the bulb demonstrates mild fusiform dilatation with a tapered stenosis of a significant degree at the cervical petrous junction extending into the proximal petrous segment. The right ICA petrous and cavernous segments are widely patent. Patency is seen of the right middle cerebral artery and the right anterior cerebral artery into the capillary and venous phases. Brisk cross-filling via the anterior communicating artery of the left anterior cerebral artery and left middle cerebral artery is noted. ENDOVASCULAR EMBOLIZATION OF THE RIGHT EXTERNAL CAROTID ARTERY SMALL PSEUDOANEURYSM, AND OF THE LARGE LEFT EXTERNAL CAROTID ARTERY PSEUDOANEURYSM USING BARE METAL COILS Through the 6 French Simmons 2 Envoy guide catheter in the right common carotid artery, an 017 150 cm Phenom 2 tip microcatheter was advanced over an 0.014 inch standard Aristotle micro guidewire and positioned in the proximal right external carotid artery carotid artery. Guidewire was removed. Good aspiration obtained from the hub of the microcatheter. A gentle control arteriogram performed through the microcatheter demonstrated safe positioning of the tip of the microcatheter. Coil embolization with bare metal coils was then performed from the distal proximal location occluding the origin of the right occipital artery, and the right internal maxillary artery proximally. There was complete stasis of contrast in the origin of the right external carotid artery without opacification of the external carotid artery branches. The 6 [place] Envoy guide catheter was then positioned in the left common carotid artery. Control angiogram performed demonstrated multiple focal  areas of irregularity with moderate narrowing at the of the proximal left common carotid artery. Complete occlusion of the left internal carotid artery was also noted. Left external carotid artery demonstrated a tapered severe stenosis leading up to a lobulated dense contrast presenting a pseudoaneurysm with extravasation noted projecting medially and anteriorly. An 017 microcatheter was then advanced quickly into the pseudoaneurysm over an 0.014 inch Aristotle micro guidewire. Multiple coils were then advanced into the pseudoaneurysm covered the point of extravasation with significant slowing. Occlusion was obtained of the left occipital artery, and the superficial temporal arteries. The external carotid artery distal to the pseudoaneurysm was also occluded with bare metal coils. A final control arteriogram performed through the left external carotid artery demonstrated dense packing of the pseudoaneurysm and of the surrounding branches. Due to the bleeding, patient's blood pressure required vasopressor support and IV boluses of fluids per anesthesia. He was noted to be pulseless. Full cardiopulmonary resuscitation was immediately started by the anesthesia team without success. Patient was pronounced deceased by the code blue team at 11:09 p.m. IMPRESSION: Status post endovascular embolization of pseudoaneurysm of the larger left external carotid with extravasation to near complete stasis with bare metal endovascular coils, and of the smaller right external carotid artery with bare metal endovascular coils to stasis. Extensive changes involving the right internal carotid artery extra cranially and intracranially, and of the posterior circulation  likely related to a combination of previous radiation, and diffuse intracranial and extracranial atherosclerotic changes. PLAN: As per referring MD. INDICATION: Recurrent oro pharyngeal bleeding. History of rectal carcinoma with laryngectomy and radiation chemotherapy and  radiation. Electronically Signed   By: Thyra Nash M.D.   On: 09/25/2023 08:05   IR Angiogram Follow Up Study Result Date: 09/25/2023 CLINICAL DATA:  Recurrent episodes of copious oral hemorrhage. Past history of orolaryngeal cancer status post multiple surgeries, radiation treatment and chemotherapy. EXAM: IR ANGIO EXTERNAL CAROTID SEL EXT CAROTID BILAT MOD SED COMPARISON:  CT of the 11/14/2022 MEDICATIONS: Heparin  none units IV. No antibiotic was administered within 1 hour of the procedure. ANESTHESIA/SEDATION: General anesthesia. CONTRAST:  Omnipaque  300 approximately 250 mL. FLUOROSCOPY TIME:  Fluoroscopy Time: 51 minutes 36 seconds (572 mGy). COMPLICATIONS: None immediate. TECHNIQUE: Informed witnessed written consent was obtained from the patient's daughter after a thorough discussion of the procedural risks, benefits and alternatives. Risks of thromboembolic stroke, vessel injury at the site of entry, infection, bleeding, renal failure related to the contrast, and not limited to the above mentioned potential risks, were discussed in detail with the daughter over the phone. All questions were addressed. Maximal Sterile Barrier Technique was utilized including caps, mask, sterile gowns, sterile gloves, sterile drape, hand hygiene and skin antiseptic. A timeout was performed prior to the initiation of the procedure. The right groin was prepped and draped in the usual sterile fashion. Thereafter using modified Seldinger technique, transfemoral access into the right common femoral artery was obtained without difficulty. Over an 0.035 inch guidewire, a 7 French 65 cm Arrow neurovascular sheath was inserted. Through this, and also over an 0.035 inch guidewire, a 6 French 95 cm Simmons 2 Envoy guide catheter was advanced to the aortic arch region, and selectively positioned in the right common carotid artery and the left common carotid artery. Arteriograms were then obtained centered intracranially, and  extracranially. Also performed was an initial left vertebral artery arteriogram using a 5 French diagnostic catheter. FINDINGS: Dominant left vertebral artery origin demonstrates mild stenosis at its origin. The vessel is seen to opacify to the cranial skull base with multiple focal areas of mild smooth irregularity associated with outpouching especially the distal 2/3. Patency is seen of the left vertebrobasilar junction and hypoplastic left posterior-inferior cerebellar artery. The basilar artery, the posterior cerebral arteries, the superior cerebellar arteries and the anterior-inferior cerebellar arteries demonstrate patency into the capillary and venous phases. Patency of the dural venous sinuses maintained on delayed venous phase. Multiple focal areas of smooth narrowing are evident in the distal basilar artery, the posterior cerebral arteries proximally and distally. Dominant artery demonstrates moderate tortuosity with acute angulation at its bifurcation with the subclavian and the right common carotid artery proximally. Focal areas of mild narrowing are evident in the distal 1/3 of the right subclavian artery. A hypoplastic right vertebral artery is seen projecting intracranially with multiple focal areas of irregularity associated with mild narrowing. Right common carotid arteriogram demonstrates irregularity of the distal 2/3 of the right common carotid artery associated with mild narrowing of the distal 1/3. The right external carotid artery demonstrates moderate narrowing associated with irregularity of the occipital artery, and the internal maxillary artery. An anteriorly protruding pouch is seen with to and fro motion at the level of the previous surgical clips noted in the region of the proximal right external carotid artery. The right internal carotid artery at the bulb demonstrates mild fusiform dilatation with a tapered stenosis of a significant degree  at the cervical petrous junction extending  into the proximal petrous segment. The right ICA petrous and cavernous segments are widely patent. Patency is seen of the right middle cerebral artery and the right anterior cerebral artery into the capillary and venous phases. Brisk cross-filling via the anterior communicating artery of the left anterior cerebral artery and left middle cerebral artery is noted. ENDOVASCULAR EMBOLIZATION OF THE RIGHT EXTERNAL CAROTID ARTERY SMALL PSEUDOANEURYSM, AND OF THE LARGE LEFT EXTERNAL CAROTID ARTERY PSEUDOANEURYSM USING BARE METAL COILS Through the 6 French Simmons 2 Envoy guide catheter in the right common carotid artery, an 017 150 cm Phenom 2 tip microcatheter was advanced over an 0.014 inch standard Aristotle micro guidewire and positioned in the proximal right external carotid artery carotid artery. Guidewire was removed. Good aspiration obtained from the hub of the microcatheter. A gentle control arteriogram performed through the microcatheter demonstrated safe positioning of the tip of the microcatheter. Coil embolization with bare metal coils was then performed from the distal proximal location occluding the origin of the right occipital artery, and the right internal maxillary artery proximally. There was complete stasis of contrast in the origin of the right external carotid artery without opacification of the external carotid artery branches. The 6 [place] Envoy guide catheter was then positioned in the left common carotid artery. Control angiogram performed demonstrated multiple focal areas of irregularity with moderate narrowing at the of the proximal left common carotid artery. Complete occlusion of the left internal carotid artery was also noted. Left external carotid artery demonstrated a tapered severe stenosis leading up to a lobulated dense contrast presenting a pseudoaneurysm with extravasation noted projecting medially and anteriorly. An 017 microcatheter was then advanced quickly into the pseudoaneurysm  over an 0.014 inch Aristotle micro guidewire. Multiple coils were then advanced into the pseudoaneurysm covered the point of extravasation with significant slowing. Occlusion was obtained of the left occipital artery, and the superficial temporal arteries. The external carotid artery distal to the pseudoaneurysm was also occluded with bare metal coils. A final control arteriogram performed through the left external carotid artery demonstrated dense packing of the pseudoaneurysm and of the surrounding branches. Due to the bleeding, patient's blood pressure required vasopressor support and IV boluses of fluids per anesthesia. He was noted to be pulseless. Full cardiopulmonary resuscitation was immediately started by the anesthesia team without success. Patient was pronounced deceased by the code blue team at 11:09 p.m. IMPRESSION: Status post endovascular embolization of pseudoaneurysm of the larger left external carotid with extravasation to near complete stasis with bare metal endovascular coils, and of the smaller right external carotid artery with bare metal endovascular coils to stasis. Extensive changes involving the right internal carotid artery extra cranially and intracranially, and of the posterior circulation likely related to a combination of previous radiation, and diffuse intracranial and extracranial atherosclerotic changes. PLAN: As per referring MD. INDICATION: Recurrent oro pharyngeal bleeding. History of rectal carcinoma with laryngectomy and radiation chemotherapy and radiation. Electronically Signed   By: Thyra Nash M.D.   On: 09/25/2023 08:05   IR Angiogram Follow Up Study Result Date: 09/25/2023 CLINICAL DATA:  Recurrent episodes of copious oral hemorrhage. Past history of orolaryngeal cancer status post multiple surgeries, radiation treatment and chemotherapy. EXAM: IR ANGIO EXTERNAL CAROTID SEL EXT CAROTID BILAT MOD SED COMPARISON:  CT of the 11/14/2022 MEDICATIONS: Heparin  none  units IV. No antibiotic was administered within 1 hour of the procedure. ANESTHESIA/SEDATION: General anesthesia. CONTRAST:  Omnipaque  300 approximately 250 mL. FLUOROSCOPY TIME:  Fluoroscopy  Time: 51 minutes 36 seconds (572 mGy). COMPLICATIONS: None immediate. TECHNIQUE: Informed witnessed written consent was obtained from the patient's daughter after a thorough discussion of the procedural risks, benefits and alternatives. Risks of thromboembolic stroke, vessel injury at the site of entry, infection, bleeding, renal failure related to the contrast, and not limited to the above mentioned potential risks, were discussed in detail with the daughter over the phone. All questions were addressed. Maximal Sterile Barrier Technique was utilized including caps, mask, sterile gowns, sterile gloves, sterile drape, hand hygiene and skin antiseptic. A timeout was performed prior to the initiation of the procedure. The right groin was prepped and draped in the usual sterile fashion. Thereafter using modified Seldinger technique, transfemoral access into the right common femoral artery was obtained without difficulty. Over an 0.035 inch guidewire, a 7 French 65 cm Arrow neurovascular sheath was inserted. Through this, and also over an 0.035 inch guidewire, a 6 French 95 cm Simmons 2 Envoy guide catheter was advanced to the aortic arch region, and selectively positioned in the right common carotid artery and the left common carotid artery. Arteriograms were then obtained centered intracranially, and extracranially. Also performed was an initial left vertebral artery arteriogram using a 5 French diagnostic catheter. FINDINGS: Dominant left vertebral artery origin demonstrates mild stenosis at its origin. The vessel is seen to opacify to the cranial skull base with multiple focal areas of mild smooth irregularity associated with outpouching especially the distal 2/3. Patency is seen of the left vertebrobasilar junction and  hypoplastic left posterior-inferior cerebellar artery. The basilar artery, the posterior cerebral arteries, the superior cerebellar arteries and the anterior-inferior cerebellar arteries demonstrate patency into the capillary and venous phases. Patency of the dural venous sinuses maintained on delayed venous phase. Multiple focal areas of smooth narrowing are evident in the distal basilar artery, the posterior cerebral arteries proximally and distally. Dominant artery demonstrates moderate tortuosity with acute angulation at its bifurcation with the subclavian and the right common carotid artery proximally. Focal areas of mild narrowing are evident in the distal 1/3 of the right subclavian artery. A hypoplastic right vertebral artery is seen projecting intracranially with multiple focal areas of irregularity associated with mild narrowing. Right common carotid arteriogram demonstrates irregularity of the distal 2/3 of the right common carotid artery associated with mild narrowing of the distal 1/3. The right external carotid artery demonstrates moderate narrowing associated with irregularity of the occipital artery, and the internal maxillary artery. An anteriorly protruding pouch is seen with to and fro motion at the level of the previous surgical clips noted in the region of the proximal right external carotid artery. The right internal carotid artery at the bulb demonstrates mild fusiform dilatation with a tapered stenosis of a significant degree at the cervical petrous junction extending into the proximal petrous segment. The right ICA petrous and cavernous segments are widely patent. Patency is seen of the right middle cerebral artery and the right anterior cerebral artery into the capillary and venous phases. Brisk cross-filling via the anterior communicating artery of the left anterior cerebral artery and left middle cerebral artery is noted. ENDOVASCULAR EMBOLIZATION OF THE RIGHT EXTERNAL CAROTID ARTERY  SMALL PSEUDOANEURYSM, AND OF THE LARGE LEFT EXTERNAL CAROTID ARTERY PSEUDOANEURYSM USING BARE METAL COILS Through the 6 French Simmons 2 Envoy guide catheter in the right common carotid artery, an 017 150 cm Phenom 2 tip microcatheter was advanced over an 0.014 inch standard Aristotle micro guidewire and positioned in the proximal right external carotid artery carotid  artery. Guidewire was removed. Good aspiration obtained from the hub of the microcatheter. A gentle control arteriogram performed through the microcatheter demonstrated safe positioning of the tip of the microcatheter. Coil embolization with bare metal coils was then performed from the distal proximal location occluding the origin of the right occipital artery, and the right internal maxillary artery proximally. There was complete stasis of contrast in the origin of the right external carotid artery without opacification of the external carotid artery branches. The 6 [place] Envoy guide catheter was then positioned in the left common carotid artery. Control angiogram performed demonstrated multiple focal areas of irregularity with moderate narrowing at the of the proximal left common carotid artery. Complete occlusion of the left internal carotid artery was also noted. Left external carotid artery demonstrated a tapered severe stenosis leading up to a lobulated dense contrast presenting a pseudoaneurysm with extravasation noted projecting medially and anteriorly. An 017 microcatheter was then advanced quickly into the pseudoaneurysm over an 0.014 inch Aristotle micro guidewire. Multiple coils were then advanced into the pseudoaneurysm covered the point of extravasation with significant slowing. Occlusion was obtained of the left occipital artery, and the superficial temporal arteries. The external carotid artery distal to the pseudoaneurysm was also occluded with bare metal coils. A final control arteriogram performed through the left external carotid  artery demonstrated dense packing of the pseudoaneurysm and of the surrounding branches. Due to the bleeding, patient's blood pressure required vasopressor support and IV boluses of fluids per anesthesia. He was noted to be pulseless. Full cardiopulmonary resuscitation was immediately started by the anesthesia team without success. Patient was pronounced deceased by the code blue team at 11:09 p.m. IMPRESSION: Status post endovascular embolization of pseudoaneurysm of the larger left external carotid with extravasation to near complete stasis with bare metal endovascular coils, and of the smaller right external carotid artery with bare metal endovascular coils to stasis. Extensive changes involving the right internal carotid artery extra cranially and intracranially, and of the posterior circulation likely related to a combination of previous radiation, and diffuse intracranial and extracranial atherosclerotic changes. PLAN: As per referring MD. INDICATION: Recurrent oro pharyngeal bleeding. History of rectal carcinoma with laryngectomy and radiation chemotherapy and radiation. Electronically Signed   By: Thyra Nash M.D.   On: 09/25/2023 08:05   IR Angiogram Follow Up Study Result Date: 09/25/2023 CLINICAL DATA:  Recurrent episodes of copious oral hemorrhage. Past history of orolaryngeal cancer status post multiple surgeries, radiation treatment and chemotherapy. EXAM: IR ANGIO EXTERNAL CAROTID SEL EXT CAROTID BILAT MOD SED COMPARISON:  CT of the 11/14/2022 MEDICATIONS: Heparin  none units IV. No antibiotic was administered within 1 hour of the procedure. ANESTHESIA/SEDATION: General anesthesia. CONTRAST:  Omnipaque  300 approximately 250 mL. FLUOROSCOPY TIME:  Fluoroscopy Time: 51 minutes 36 seconds (572 mGy). COMPLICATIONS: None immediate. TECHNIQUE: Informed witnessed written consent was obtained from the patient's daughter after a thorough discussion of the procedural risks, benefits and alternatives.  Risks of thromboembolic stroke, vessel injury at the site of entry, infection, bleeding, renal failure related to the contrast, and not limited to the above mentioned potential risks, were discussed in detail with the daughter over the phone. All questions were addressed. Maximal Sterile Barrier Technique was utilized including caps, mask, sterile gowns, sterile gloves, sterile drape, hand hygiene and skin antiseptic. A timeout was performed prior to the initiation of the procedure. The right groin was prepped and draped in the usual sterile fashion. Thereafter using modified Seldinger technique, transfemoral access into the right common femoral  artery was obtained without difficulty. Over an 0.035 inch guidewire, a 7 French 65 cm Arrow neurovascular sheath was inserted. Through this, and also over an 0.035 inch guidewire, a 6 French 95 cm Simmons 2 Envoy guide catheter was advanced to the aortic arch region, and selectively positioned in the right common carotid artery and the left common carotid artery. Arteriograms were then obtained centered intracranially, and extracranially. Also performed was an initial left vertebral artery arteriogram using a 5 French diagnostic catheter. FINDINGS: Dominant left vertebral artery origin demonstrates mild stenosis at its origin. The vessel is seen to opacify to the cranial skull base with multiple focal areas of mild smooth irregularity associated with outpouching especially the distal 2/3. Patency is seen of the left vertebrobasilar junction and hypoplastic left posterior-inferior cerebellar artery. The basilar artery, the posterior cerebral arteries, the superior cerebellar arteries and the anterior-inferior cerebellar arteries demonstrate patency into the capillary and venous phases. Patency of the dural venous sinuses maintained on delayed venous phase. Multiple focal areas of smooth narrowing are evident in the distal basilar artery, the posterior cerebral arteries  proximally and distally. Dominant artery demonstrates moderate tortuosity with acute angulation at its bifurcation with the subclavian and the right common carotid artery proximally. Focal areas of mild narrowing are evident in the distal 1/3 of the right subclavian artery. A hypoplastic right vertebral artery is seen projecting intracranially with multiple focal areas of irregularity associated with mild narrowing. Right common carotid arteriogram demonstrates irregularity of the distal 2/3 of the right common carotid artery associated with mild narrowing of the distal 1/3. The right external carotid artery demonstrates moderate narrowing associated with irregularity of the occipital artery, and the internal maxillary artery. An anteriorly protruding pouch is seen with to and fro motion at the level of the previous surgical clips noted in the region of the proximal right external carotid artery. The right internal carotid artery at the bulb demonstrates mild fusiform dilatation with a tapered stenosis of a significant degree at the cervical petrous junction extending into the proximal petrous segment. The right ICA petrous and cavernous segments are widely patent. Patency is seen of the right middle cerebral artery and the right anterior cerebral artery into the capillary and venous phases. Brisk cross-filling via the anterior communicating artery of the left anterior cerebral artery and left middle cerebral artery is noted. ENDOVASCULAR EMBOLIZATION OF THE RIGHT EXTERNAL CAROTID ARTERY SMALL PSEUDOANEURYSM, AND OF THE LARGE LEFT EXTERNAL CAROTID ARTERY PSEUDOANEURYSM USING BARE METAL COILS Through the 6 French Simmons 2 Envoy guide catheter in the right common carotid artery, an 017 150 cm Phenom 2 tip microcatheter was advanced over an 0.014 inch standard Aristotle micro guidewire and positioned in the proximal right external carotid artery carotid artery. Guidewire was removed. Good aspiration obtained from the  hub of the microcatheter. A gentle control arteriogram performed through the microcatheter demonstrated safe positioning of the tip of the microcatheter. Coil embolization with bare metal coils was then performed from the distal proximal location occluding the origin of the right occipital artery, and the right internal maxillary artery proximally. There was complete stasis of contrast in the origin of the right external carotid artery without opacification of the external carotid artery branches. The 6 [place] Envoy guide catheter was then positioned in the left common carotid artery. Control angiogram performed demonstrated multiple focal areas of irregularity with moderate narrowing at the of the proximal left common carotid artery. Complete occlusion of the left internal carotid artery was also noted.  Left external carotid artery demonstrated a tapered severe stenosis leading up to a lobulated dense contrast presenting a pseudoaneurysm with extravasation noted projecting medially and anteriorly. An 017 microcatheter was then advanced quickly into the pseudoaneurysm over an 0.014 inch Aristotle micro guidewire. Multiple coils were then advanced into the pseudoaneurysm covered the point of extravasation with significant slowing. Occlusion was obtained of the left occipital artery, and the superficial temporal arteries. The external carotid artery distal to the pseudoaneurysm was also occluded with bare metal coils. A final control arteriogram performed through the left external carotid artery demonstrated dense packing of the pseudoaneurysm and of the surrounding branches. Due to the bleeding, patient's blood pressure required vasopressor support and IV boluses of fluids per anesthesia. He was noted to be pulseless. Full cardiopulmonary resuscitation was immediately started by the anesthesia team without success. Patient was pronounced deceased by the code blue team at 11:09 p.m. IMPRESSION: Status post  endovascular embolization of pseudoaneurysm of the larger left external carotid with extravasation to near complete stasis with bare metal endovascular coils, and of the smaller right external carotid artery with bare metal endovascular coils to stasis. Extensive changes involving the right internal carotid artery extra cranially and intracranially, and of the posterior circulation likely related to a combination of previous radiation, and diffuse intracranial and extracranial atherosclerotic changes. PLAN: As per referring MD. INDICATION: Recurrent oro pharyngeal bleeding. History of rectal carcinoma with laryngectomy and radiation chemotherapy and radiation. Electronically Signed   By: Thyra Nash M.D.   On: 09/25/2023 08:05   IR Angiogram Follow Up Study Result Date: 09/25/2023 CLINICAL DATA:  Recurrent episodes of copious oral hemorrhage. Past history of orolaryngeal cancer status post multiple surgeries, radiation treatment and chemotherapy. EXAM: IR ANGIO EXTERNAL CAROTID SEL EXT CAROTID BILAT MOD SED COMPARISON:  CT of the 11/14/2022 MEDICATIONS: Heparin  none units IV. No antibiotic was administered within 1 hour of the procedure. ANESTHESIA/SEDATION: General anesthesia. CONTRAST:  Omnipaque  300 approximately 250 mL. FLUOROSCOPY TIME:  Fluoroscopy Time: 51 minutes 36 seconds (572 mGy). COMPLICATIONS: None immediate. TECHNIQUE: Informed witnessed written consent was obtained from the patient's daughter after a thorough discussion of the procedural risks, benefits and alternatives. Risks of thromboembolic stroke, vessel injury at the site of entry, infection, bleeding, renal failure related to the contrast, and not limited to the above mentioned potential risks, were discussed in detail with the daughter over the phone. All questions were addressed. Maximal Sterile Barrier Technique was utilized including caps, mask, sterile gowns, sterile gloves, sterile drape, hand hygiene and skin antiseptic. A  timeout was performed prior to the initiation of the procedure. The right groin was prepped and draped in the usual sterile fashion. Thereafter using modified Seldinger technique, transfemoral access into the right common femoral artery was obtained without difficulty. Over an 0.035 inch guidewire, a 7 French 65 cm Arrow neurovascular sheath was inserted. Through this, and also over an 0.035 inch guidewire, a 6 French 95 cm Simmons 2 Envoy guide catheter was advanced to the aortic arch region, and selectively positioned in the right common carotid artery and the left common carotid artery. Arteriograms were then obtained centered intracranially, and extracranially. Also performed was an initial left vertebral artery arteriogram using a 5 French diagnostic catheter. FINDINGS: Dominant left vertebral artery origin demonstrates mild stenosis at its origin. The vessel is seen to opacify to the cranial skull base with multiple focal areas of mild smooth irregularity associated with outpouching especially the distal 2/3. Patency is seen of the left vertebrobasilar  junction and hypoplastic left posterior-inferior cerebellar artery. The basilar artery, the posterior cerebral arteries, the superior cerebellar arteries and the anterior-inferior cerebellar arteries demonstrate patency into the capillary and venous phases. Patency of the dural venous sinuses maintained on delayed venous phase. Multiple focal areas of smooth narrowing are evident in the distal basilar artery, the posterior cerebral arteries proximally and distally. Dominant artery demonstrates moderate tortuosity with acute angulation at its bifurcation with the subclavian and the right common carotid artery proximally. Focal areas of mild narrowing are evident in the distal 1/3 of the right subclavian artery. A hypoplastic right vertebral artery is seen projecting intracranially with multiple focal areas of irregularity associated with mild narrowing. Right  common carotid arteriogram demonstrates irregularity of the distal 2/3 of the right common carotid artery associated with mild narrowing of the distal 1/3. The right external carotid artery demonstrates moderate narrowing associated with irregularity of the occipital artery, and the internal maxillary artery. An anteriorly protruding pouch is seen with to and fro motion at the level of the previous surgical clips noted in the region of the proximal right external carotid artery. The right internal carotid artery at the bulb demonstrates mild fusiform dilatation with a tapered stenosis of a significant degree at the cervical petrous junction extending into the proximal petrous segment. The right ICA petrous and cavernous segments are widely patent. Patency is seen of the right middle cerebral artery and the right anterior cerebral artery into the capillary and venous phases. Brisk cross-filling via the anterior communicating artery of the left anterior cerebral artery and left middle cerebral artery is noted. ENDOVASCULAR EMBOLIZATION OF THE RIGHT EXTERNAL CAROTID ARTERY SMALL PSEUDOANEURYSM, AND OF THE LARGE LEFT EXTERNAL CAROTID ARTERY PSEUDOANEURYSM USING BARE METAL COILS Through the 6 French Simmons 2 Envoy guide catheter in the right common carotid artery, an 017 150 cm Phenom 2 tip microcatheter was advanced over an 0.014 inch standard Aristotle micro guidewire and positioned in the proximal right external carotid artery carotid artery. Guidewire was removed. Good aspiration obtained from the hub of the microcatheter. A gentle control arteriogram performed through the microcatheter demonstrated safe positioning of the tip of the microcatheter. Coil embolization with bare metal coils was then performed from the distal proximal location occluding the origin of the right occipital artery, and the right internal maxillary artery proximally. There was complete stasis of contrast in the origin of the right external  carotid artery without opacification of the external carotid artery branches. The 6 [place] Envoy guide catheter was then positioned in the left common carotid artery. Control angiogram performed demonstrated multiple focal areas of irregularity with moderate narrowing at the of the proximal left common carotid artery. Complete occlusion of the left internal carotid artery was also noted. Left external carotid artery demonstrated a tapered severe stenosis leading up to a lobulated dense contrast presenting a pseudoaneurysm with extravasation noted projecting medially and anteriorly. An 017 microcatheter was then advanced quickly into the pseudoaneurysm over an 0.014 inch Aristotle micro guidewire. Multiple coils were then advanced into the pseudoaneurysm covered the point of extravasation with significant slowing. Occlusion was obtained of the left occipital artery, and the superficial temporal arteries. The external carotid artery distal to the pseudoaneurysm was also occluded with bare metal coils. A final control arteriogram performed through the left external carotid artery demonstrated dense packing of the pseudoaneurysm and of the surrounding branches. Due to the bleeding, patient's blood pressure required vasopressor support and IV boluses of fluids per anesthesia. He was noted  to be pulseless. Full cardiopulmonary resuscitation was immediately started by the anesthesia team without success. Patient was pronounced deceased by the code blue team at 11:09 p.m. IMPRESSION: Status post endovascular embolization of pseudoaneurysm of the larger left external carotid with extravasation to near complete stasis with bare metal endovascular coils, and of the smaller right external carotid artery with bare metal endovascular coils to stasis. Extensive changes involving the right internal carotid artery extra cranially and intracranially, and of the posterior circulation likely related to a combination of previous  radiation, and diffuse intracranial and extracranial atherosclerotic changes. PLAN: As per referring MD. INDICATION: Recurrent oro pharyngeal bleeding. History of rectal carcinoma with laryngectomy and radiation chemotherapy and radiation. Electronically Signed   By: Thyra Nash M.D.   On: 09/25/2023 08:05   IR Angiogram Follow Up Study Result Date: 09/25/2023 CLINICAL DATA:  Recurrent episodes of copious oral hemorrhage. Past history of orolaryngeal cancer status post multiple surgeries, radiation treatment and chemotherapy. EXAM: IR ANGIO EXTERNAL CAROTID SEL EXT CAROTID BILAT MOD SED COMPARISON:  CT of the 11/14/2022 MEDICATIONS: Heparin  none units IV. No antibiotic was administered within 1 hour of the procedure. ANESTHESIA/SEDATION: General anesthesia. CONTRAST:  Omnipaque  300 approximately 250 mL. FLUOROSCOPY TIME:  Fluoroscopy Time: 51 minutes 36 seconds (572 mGy). COMPLICATIONS: None immediate. TECHNIQUE: Informed witnessed written consent was obtained from the patient's daughter after a thorough discussion of the procedural risks, benefits and alternatives. Risks of thromboembolic stroke, vessel injury at the site of entry, infection, bleeding, renal failure related to the contrast, and not limited to the above mentioned potential risks, were discussed in detail with the daughter over the phone. All questions were addressed. Maximal Sterile Barrier Technique was utilized including caps, mask, sterile gowns, sterile gloves, sterile drape, hand hygiene and skin antiseptic. A timeout was performed prior to the initiation of the procedure. The right groin was prepped and draped in the usual sterile fashion. Thereafter using modified Seldinger technique, transfemoral access into the right common femoral artery was obtained without difficulty. Over an 0.035 inch guidewire, a 7 French 65 cm Arrow neurovascular sheath was inserted. Through this, and also over an 0.035 inch guidewire, a 6 French 95 cm  Simmons 2 Envoy guide catheter was advanced to the aortic arch region, and selectively positioned in the right common carotid artery and the left common carotid artery. Arteriograms were then obtained centered intracranially, and extracranially. Also performed was an initial left vertebral artery arteriogram using a 5 French diagnostic catheter. FINDINGS: Dominant left vertebral artery origin demonstrates mild stenosis at its origin. The vessel is seen to opacify to the cranial skull base with multiple focal areas of mild smooth irregularity associated with outpouching especially the distal 2/3. Patency is seen of the left vertebrobasilar junction and hypoplastic left posterior-inferior cerebellar artery. The basilar artery, the posterior cerebral arteries, the superior cerebellar arteries and the anterior-inferior cerebellar arteries demonstrate patency into the capillary and venous phases. Patency of the dural venous sinuses maintained on delayed venous phase. Multiple focal areas of smooth narrowing are evident in the distal basilar artery, the posterior cerebral arteries proximally and distally. Dominant artery demonstrates moderate tortuosity with acute angulation at its bifurcation with the subclavian and the right common carotid artery proximally. Focal areas of mild narrowing are evident in the distal 1/3 of the right subclavian artery. A hypoplastic right vertebral artery is seen projecting intracranially with multiple focal areas of irregularity associated with mild narrowing. Right common carotid arteriogram demonstrates irregularity of the distal 2/3 of  the right common carotid artery associated with mild narrowing of the distal 1/3. The right external carotid artery demonstrates moderate narrowing associated with irregularity of the occipital artery, and the internal maxillary artery. An anteriorly protruding pouch is seen with to and fro motion at the level of the previous surgical clips noted in the  region of the proximal right external carotid artery. The right internal carotid artery at the bulb demonstrates mild fusiform dilatation with a tapered stenosis of a significant degree at the cervical petrous junction extending into the proximal petrous segment. The right ICA petrous and cavernous segments are widely patent. Patency is seen of the right middle cerebral artery and the right anterior cerebral artery into the capillary and venous phases. Brisk cross-filling via the anterior communicating artery of the left anterior cerebral artery and left middle cerebral artery is noted. ENDOVASCULAR EMBOLIZATION OF THE RIGHT EXTERNAL CAROTID ARTERY SMALL PSEUDOANEURYSM, AND OF THE LARGE LEFT EXTERNAL CAROTID ARTERY PSEUDOANEURYSM USING BARE METAL COILS Through the 6 French Simmons 2 Envoy guide catheter in the right common carotid artery, an 017 150 cm Phenom 2 tip microcatheter was advanced over an 0.014 inch standard Aristotle micro guidewire and positioned in the proximal right external carotid artery carotid artery. Guidewire was removed. Good aspiration obtained from the hub of the microcatheter. A gentle control arteriogram performed through the microcatheter demonstrated safe positioning of the tip of the microcatheter. Coil embolization with bare metal coils was then performed from the distal proximal location occluding the origin of the right occipital artery, and the right internal maxillary artery proximally. There was complete stasis of contrast in the origin of the right external carotid artery without opacification of the external carotid artery branches. The 6 [place] Envoy guide catheter was then positioned in the left common carotid artery. Control angiogram performed demonstrated multiple focal areas of irregularity with moderate narrowing at the of the proximal left common carotid artery. Complete occlusion of the left internal carotid artery was also noted. Left external carotid artery  demonstrated a tapered severe stenosis leading up to a lobulated dense contrast presenting a pseudoaneurysm with extravasation noted projecting medially and anteriorly. An 017 microcatheter was then advanced quickly into the pseudoaneurysm over an 0.014 inch Aristotle micro guidewire. Multiple coils were then advanced into the pseudoaneurysm covered the point of extravasation with significant slowing. Occlusion was obtained of the left occipital artery, and the superficial temporal arteries. The external carotid artery distal to the pseudoaneurysm was also occluded with bare metal coils. A final control arteriogram performed through the left external carotid artery demonstrated dense packing of the pseudoaneurysm and of the surrounding branches. Due to the bleeding, patient's blood pressure required vasopressor support and IV boluses of fluids per anesthesia. He was noted to be pulseless. Full cardiopulmonary resuscitation was immediately started by the anesthesia team without success. Patient was pronounced deceased by the code blue team at 11:09 p.m. IMPRESSION: Status post endovascular embolization of pseudoaneurysm of the larger left external carotid with extravasation to near complete stasis with bare metal endovascular coils, and of the smaller right external carotid artery with bare metal endovascular coils to stasis. Extensive changes involving the right internal carotid artery extra cranially and intracranially, and of the posterior circulation likely related to a combination of previous radiation, and diffuse intracranial and extracranial atherosclerotic changes. PLAN: As per referring MD. INDICATION: Recurrent oro pharyngeal bleeding. History of rectal carcinoma with laryngectomy and radiation chemotherapy and radiation. Electronically Signed   By: Thyra Dolphus HERO.D.  On: 09/25/2023 08:05   IR Angiogram Follow Up Study Result Date: 09/25/2023 CLINICAL DATA:  Recurrent episodes of copious oral  hemorrhage. Past history of orolaryngeal cancer status post multiple surgeries, radiation treatment and chemotherapy. EXAM: IR ANGIO EXTERNAL CAROTID SEL EXT CAROTID BILAT MOD SED COMPARISON:  CT of the 11/14/2022 MEDICATIONS: Heparin  none units IV. No antibiotic was administered within 1 hour of the procedure. ANESTHESIA/SEDATION: General anesthesia. CONTRAST:  Omnipaque  300 approximately 250 mL. FLUOROSCOPY TIME:  Fluoroscopy Time: 51 minutes 36 seconds (572 mGy). COMPLICATIONS: None immediate. TECHNIQUE: Informed witnessed written consent was obtained from the patient's daughter after a thorough discussion of the procedural risks, benefits and alternatives. Risks of thromboembolic stroke, vessel injury at the site of entry, infection, bleeding, renal failure related to the contrast, and not limited to the above mentioned potential risks, were discussed in detail with the daughter over the phone. All questions were addressed. Maximal Sterile Barrier Technique was utilized including caps, mask, sterile gowns, sterile gloves, sterile drape, hand hygiene and skin antiseptic. A timeout was performed prior to the initiation of the procedure. The right groin was prepped and draped in the usual sterile fashion. Thereafter using modified Seldinger technique, transfemoral access into the right common femoral artery was obtained without difficulty. Over an 0.035 inch guidewire, a 7 French 65 cm Arrow neurovascular sheath was inserted. Through this, and also over an 0.035 inch guidewire, a 6 French 95 cm Simmons 2 Envoy guide catheter was advanced to the aortic arch region, and selectively positioned in the right common carotid artery and the left common carotid artery. Arteriograms were then obtained centered intracranially, and extracranially. Also performed was an initial left vertebral artery arteriogram using a 5 French diagnostic catheter. FINDINGS: Dominant left vertebral artery origin demonstrates mild stenosis at its  origin. The vessel is seen to opacify to the cranial skull base with multiple focal areas of mild smooth irregularity associated with outpouching especially the distal 2/3. Patency is seen of the left vertebrobasilar junction and hypoplastic left posterior-inferior cerebellar artery. The basilar artery, the posterior cerebral arteries, the superior cerebellar arteries and the anterior-inferior cerebellar arteries demonstrate patency into the capillary and venous phases. Patency of the dural venous sinuses maintained on delayed venous phase. Multiple focal areas of smooth narrowing are evident in the distal basilar artery, the posterior cerebral arteries proximally and distally. Dominant artery demonstrates moderate tortuosity with acute angulation at its bifurcation with the subclavian and the right common carotid artery proximally. Focal areas of mild narrowing are evident in the distal 1/3 of the right subclavian artery. A hypoplastic right vertebral artery is seen projecting intracranially with multiple focal areas of irregularity associated with mild narrowing. Right common carotid arteriogram demonstrates irregularity of the distal 2/3 of the right common carotid artery associated with mild narrowing of the distal 1/3. The right external carotid artery demonstrates moderate narrowing associated with irregularity of the occipital artery, and the internal maxillary artery. An anteriorly protruding pouch is seen with to and fro motion at the level of the previous surgical clips noted in the region of the proximal right external carotid artery. The right internal carotid artery at the bulb demonstrates mild fusiform dilatation with a tapered stenosis of a significant degree at the cervical petrous junction extending into the proximal petrous segment. The right ICA petrous and cavernous segments are widely patent. Patency is seen of the right middle cerebral artery and the right anterior cerebral artery into the  capillary and venous phases. Brisk cross-filling via the  anterior communicating artery of the left anterior cerebral artery and left middle cerebral artery is noted. ENDOVASCULAR EMBOLIZATION OF THE RIGHT EXTERNAL CAROTID ARTERY SMALL PSEUDOANEURYSM, AND OF THE LARGE LEFT EXTERNAL CAROTID ARTERY PSEUDOANEURYSM USING BARE METAL COILS Through the 6 French Simmons 2 Envoy guide catheter in the right common carotid artery, an 017 150 cm Phenom 2 tip microcatheter was advanced over an 0.014 inch standard Aristotle micro guidewire and positioned in the proximal right external carotid artery carotid artery. Guidewire was removed. Good aspiration obtained from the hub of the microcatheter. A gentle control arteriogram performed through the microcatheter demonstrated safe positioning of the tip of the microcatheter. Coil embolization with bare metal coils was then performed from the distal proximal location occluding the origin of the right occipital artery, and the right internal maxillary artery proximally. There was complete stasis of contrast in the origin of the right external carotid artery without opacification of the external carotid artery branches. The 6 [place] Envoy guide catheter was then positioned in the left common carotid artery. Control angiogram performed demonstrated multiple focal areas of irregularity with moderate narrowing at the of the proximal left common carotid artery. Complete occlusion of the left internal carotid artery was also noted. Left external carotid artery demonstrated a tapered severe stenosis leading up to a lobulated dense contrast presenting a pseudoaneurysm with extravasation noted projecting medially and anteriorly. An 017 microcatheter was then advanced quickly into the pseudoaneurysm over an 0.014 inch Aristotle micro guidewire. Multiple coils were then advanced into the pseudoaneurysm covered the point of extravasation with significant slowing. Occlusion was obtained of the left  occipital artery, and the superficial temporal arteries. The external carotid artery distal to the pseudoaneurysm was also occluded with bare metal coils. A final control arteriogram performed through the left external carotid artery demonstrated dense packing of the pseudoaneurysm and of the surrounding branches. Due to the bleeding, patient's blood pressure required vasopressor support and IV boluses of fluids per anesthesia. He was noted to be pulseless. Full cardiopulmonary resuscitation was immediately started by the anesthesia team without success. Patient was pronounced deceased by the code blue team at 11:09 p.m. IMPRESSION: Status post endovascular embolization of pseudoaneurysm of the larger left external carotid with extravasation to near complete stasis with bare metal endovascular coils, and of the smaller right external carotid artery with bare metal endovascular coils to stasis. Extensive changes involving the right internal carotid artery extra cranially and intracranially, and of the posterior circulation likely related to a combination of previous radiation, and diffuse intracranial and extracranial atherosclerotic changes. PLAN: As per referring MD. INDICATION: Recurrent oro pharyngeal bleeding. History of rectal carcinoma with laryngectomy and radiation chemotherapy and radiation. Electronically Signed   By: Thyra Nash M.D.   On: 09/25/2023 08:05   IR Angiogram Follow Up Study Result Date: 09/25/2023 CLINICAL DATA:  Recurrent episodes of copious oral hemorrhage. Past history of orolaryngeal cancer status post multiple surgeries, radiation treatment and chemotherapy. EXAM: IR ANGIO EXTERNAL CAROTID SEL EXT CAROTID BILAT MOD SED COMPARISON:  CT of the 11/14/2022 MEDICATIONS: Heparin  none units IV. No antibiotic was administered within 1 hour of the procedure. ANESTHESIA/SEDATION: General anesthesia. CONTRAST:  Omnipaque  300 approximately 250 mL. FLUOROSCOPY TIME:  Fluoroscopy Time: 51  minutes 36 seconds (572 mGy). COMPLICATIONS: None immediate. TECHNIQUE: Informed witnessed written consent was obtained from the patient's daughter after a thorough discussion of the procedural risks, benefits and alternatives. Risks of thromboembolic stroke, vessel injury at the site of entry, infection, bleeding, renal failure  related to the contrast, and not limited to the above mentioned potential risks, were discussed in detail with the daughter over the phone. All questions were addressed. Maximal Sterile Barrier Technique was utilized including caps, mask, sterile gowns, sterile gloves, sterile drape, hand hygiene and skin antiseptic. A timeout was performed prior to the initiation of the procedure. The right groin was prepped and draped in the usual sterile fashion. Thereafter using modified Seldinger technique, transfemoral access into the right common femoral artery was obtained without difficulty. Over an 0.035 inch guidewire, a 7 French 65 cm Arrow neurovascular sheath was inserted. Through this, and also over an 0.035 inch guidewire, a 6 French 95 cm Simmons 2 Envoy guide catheter was advanced to the aortic arch region, and selectively positioned in the right common carotid artery and the left common carotid artery. Arteriograms were then obtained centered intracranially, and extracranially. Also performed was an initial left vertebral artery arteriogram using a 5 French diagnostic catheter. FINDINGS: Dominant left vertebral artery origin demonstrates mild stenosis at its origin. The vessel is seen to opacify to the cranial skull base with multiple focal areas of mild smooth irregularity associated with outpouching especially the distal 2/3. Patency is seen of the left vertebrobasilar junction and hypoplastic left posterior-inferior cerebellar artery. The basilar artery, the posterior cerebral arteries, the superior cerebellar arteries and the anterior-inferior cerebellar arteries demonstrate patency  into the capillary and venous phases. Patency of the dural venous sinuses maintained on delayed venous phase. Multiple focal areas of smooth narrowing are evident in the distal basilar artery, the posterior cerebral arteries proximally and distally. Dominant artery demonstrates moderate tortuosity with acute angulation at its bifurcation with the subclavian and the right common carotid artery proximally. Focal areas of mild narrowing are evident in the distal 1/3 of the right subclavian artery. A hypoplastic right vertebral artery is seen projecting intracranially with multiple focal areas of irregularity associated with mild narrowing. Right common carotid arteriogram demonstrates irregularity of the distal 2/3 of the right common carotid artery associated with mild narrowing of the distal 1/3. The right external carotid artery demonstrates moderate narrowing associated with irregularity of the occipital artery, and the internal maxillary artery. An anteriorly protruding pouch is seen with to and fro motion at the level of the previous surgical clips noted in the region of the proximal right external carotid artery. The right internal carotid artery at the bulb demonstrates mild fusiform dilatation with a tapered stenosis of a significant degree at the cervical petrous junction extending into the proximal petrous segment. The right ICA petrous and cavernous segments are widely patent. Patency is seen of the right middle cerebral artery and the right anterior cerebral artery into the capillary and venous phases. Brisk cross-filling via the anterior communicating artery of the left anterior cerebral artery and left middle cerebral artery is noted. ENDOVASCULAR EMBOLIZATION OF THE RIGHT EXTERNAL CAROTID ARTERY SMALL PSEUDOANEURYSM, AND OF THE LARGE LEFT EXTERNAL CAROTID ARTERY PSEUDOANEURYSM USING BARE METAL COILS Through the 6 French Simmons 2 Envoy guide catheter in the right common carotid artery, an 017 150 cm  Phenom 2 tip microcatheter was advanced over an 0.014 inch standard Aristotle micro guidewire and positioned in the proximal right external carotid artery carotid artery. Guidewire was removed. Good aspiration obtained from the hub of the microcatheter. A gentle control arteriogram performed through the microcatheter demonstrated safe positioning of the tip of the microcatheter. Coil embolization with bare metal coils was then performed from the distal proximal location occluding the origin  of the right occipital artery, and the right internal maxillary artery proximally. There was complete stasis of contrast in the origin of the right external carotid artery without opacification of the external carotid artery branches. The 6 [place] Envoy guide catheter was then positioned in the left common carotid artery. Control angiogram performed demonstrated multiple focal areas of irregularity with moderate narrowing at the of the proximal left common carotid artery. Complete occlusion of the left internal carotid artery was also noted. Left external carotid artery demonstrated a tapered severe stenosis leading up to a lobulated dense contrast presenting a pseudoaneurysm with extravasation noted projecting medially and anteriorly. An 017 microcatheter was then advanced quickly into the pseudoaneurysm over an 0.014 inch Aristotle micro guidewire. Multiple coils were then advanced into the pseudoaneurysm covered the point of extravasation with significant slowing. Occlusion was obtained of the left occipital artery, and the superficial temporal arteries. The external carotid artery distal to the pseudoaneurysm was also occluded with bare metal coils. A final control arteriogram performed through the left external carotid artery demonstrated dense packing of the pseudoaneurysm and of the surrounding branches. Due to the bleeding, patient's blood pressure required vasopressor support and IV boluses of fluids per anesthesia. He  was noted to be pulseless. Full cardiopulmonary resuscitation was immediately started by the anesthesia team without success. Patient was pronounced deceased by the code blue team at 11:09 p.m. IMPRESSION: Status post endovascular embolization of pseudoaneurysm of the larger left external carotid with extravasation to near complete stasis with bare metal endovascular coils, and of the smaller right external carotid artery with bare metal endovascular coils to stasis. Extensive changes involving the right internal carotid artery extra cranially and intracranially, and of the posterior circulation likely related to a combination of previous radiation, and diffuse intracranial and extracranial atherosclerotic changes. PLAN: As per referring MD. INDICATION: Recurrent oro pharyngeal bleeding. History of rectal carcinoma with laryngectomy and radiation chemotherapy and radiation. Electronically Signed   By: Thyra Nash M.D.   On: 09/25/2023 08:05   IR Angiogram Follow Up Study Result Date: 09/25/2023 CLINICAL DATA:  Recurrent episodes of copious oral hemorrhage. Past history of orolaryngeal cancer status post multiple surgeries, radiation treatment and chemotherapy. EXAM: IR ANGIO EXTERNAL CAROTID SEL EXT CAROTID BILAT MOD SED COMPARISON:  CT of the 11/14/2022 MEDICATIONS: Heparin  none units IV. No antibiotic was administered within 1 hour of the procedure. ANESTHESIA/SEDATION: General anesthesia. CONTRAST:  Omnipaque  300 approximately 250 mL. FLUOROSCOPY TIME:  Fluoroscopy Time: 51 minutes 36 seconds (572 mGy). COMPLICATIONS: None immediate. TECHNIQUE: Informed witnessed written consent was obtained from the patient's daughter after a thorough discussion of the procedural risks, benefits and alternatives. Risks of thromboembolic stroke, vessel injury at the site of entry, infection, bleeding, renal failure related to the contrast, and not limited to the above mentioned potential risks, were discussed in detail  with the daughter over the phone. All questions were addressed. Maximal Sterile Barrier Technique was utilized including caps, mask, sterile gowns, sterile gloves, sterile drape, hand hygiene and skin antiseptic. A timeout was performed prior to the initiation of the procedure. The right groin was prepped and draped in the usual sterile fashion. Thereafter using modified Seldinger technique, transfemoral access into the right common femoral artery was obtained without difficulty. Over an 0.035 inch guidewire, a 7 French 65 cm Arrow neurovascular sheath was inserted. Through this, and also over an 0.035 inch guidewire, a 6 French 95 cm Simmons 2 Envoy guide catheter was advanced to the aortic arch region, and  selectively positioned in the right common carotid artery and the left common carotid artery. Arteriograms were then obtained centered intracranially, and extracranially. Also performed was an initial left vertebral artery arteriogram using a 5 French diagnostic catheter. FINDINGS: Dominant left vertebral artery origin demonstrates mild stenosis at its origin. The vessel is seen to opacify to the cranial skull base with multiple focal areas of mild smooth irregularity associated with outpouching especially the distal 2/3. Patency is seen of the left vertebrobasilar junction and hypoplastic left posterior-inferior cerebellar artery. The basilar artery, the posterior cerebral arteries, the superior cerebellar arteries and the anterior-inferior cerebellar arteries demonstrate patency into the capillary and venous phases. Patency of the dural venous sinuses maintained on delayed venous phase. Multiple focal areas of smooth narrowing are evident in the distal basilar artery, the posterior cerebral arteries proximally and distally. Dominant artery demonstrates moderate tortuosity with acute angulation at its bifurcation with the subclavian and the right common carotid artery proximally. Focal areas of mild narrowing  are evident in the distal 1/3 of the right subclavian artery. A hypoplastic right vertebral artery is seen projecting intracranially with multiple focal areas of irregularity associated with mild narrowing. Right common carotid arteriogram demonstrates irregularity of the distal 2/3 of the right common carotid artery associated with mild narrowing of the distal 1/3. The right external carotid artery demonstrates moderate narrowing associated with irregularity of the occipital artery, and the internal maxillary artery. An anteriorly protruding pouch is seen with to and fro motion at the level of the previous surgical clips noted in the region of the proximal right external carotid artery. The right internal carotid artery at the bulb demonstrates mild fusiform dilatation with a tapered stenosis of a significant degree at the cervical petrous junction extending into the proximal petrous segment. The right ICA petrous and cavernous segments are widely patent. Patency is seen of the right middle cerebral artery and the right anterior cerebral artery into the capillary and venous phases. Brisk cross-filling via the anterior communicating artery of the left anterior cerebral artery and left middle cerebral artery is noted. ENDOVASCULAR EMBOLIZATION OF THE RIGHT EXTERNAL CAROTID ARTERY SMALL PSEUDOANEURYSM, AND OF THE LARGE LEFT EXTERNAL CAROTID ARTERY PSEUDOANEURYSM USING BARE METAL COILS Through the 6 French Simmons 2 Envoy guide catheter in the right common carotid artery, an 017 150 cm Phenom 2 tip microcatheter was advanced over an 0.014 inch standard Aristotle micro guidewire and positioned in the proximal right external carotid artery carotid artery. Guidewire was removed. Good aspiration obtained from the hub of the microcatheter. A gentle control arteriogram performed through the microcatheter demonstrated safe positioning of the tip of the microcatheter. Coil embolization with bare metal coils was then performed  from the distal proximal location occluding the origin of the right occipital artery, and the right internal maxillary artery proximally. There was complete stasis of contrast in the origin of the right external carotid artery without opacification of the external carotid artery branches. The 6 [place] Envoy guide catheter was then positioned in the left common carotid artery. Control angiogram performed demonstrated multiple focal areas of irregularity with moderate narrowing at the of the proximal left common carotid artery. Complete occlusion of the left internal carotid artery was also noted. Left external carotid artery demonstrated a tapered severe stenosis leading up to a lobulated dense contrast presenting a pseudoaneurysm with extravasation noted projecting medially and anteriorly. An 017 microcatheter was then advanced quickly into the pseudoaneurysm over an 0.014 inch Aristotle micro guidewire. Multiple coils were then  advanced into the pseudoaneurysm covered the point of extravasation with significant slowing. Occlusion was obtained of the left occipital artery, and the superficial temporal arteries. The external carotid artery distal to the pseudoaneurysm was also occluded with bare metal coils. A final control arteriogram performed through the left external carotid artery demonstrated dense packing of the pseudoaneurysm and of the surrounding branches. Due to the bleeding, patient's blood pressure required vasopressor support and IV boluses of fluids per anesthesia. He was noted to be pulseless. Full cardiopulmonary resuscitation was immediately started by the anesthesia team without success. Patient was pronounced deceased by the code blue team at 11:09 p.m. IMPRESSION: Status post endovascular embolization of pseudoaneurysm of the larger left external carotid with extravasation to near complete stasis with bare metal endovascular coils, and of the smaller right external carotid artery with bare metal  endovascular coils to stasis. Extensive changes involving the right internal carotid artery extra cranially and intracranially, and of the posterior circulation likely related to a combination of previous radiation, and diffuse intracranial and extracranial atherosclerotic changes. PLAN: As per referring MD. INDICATION: Recurrent oro pharyngeal bleeding. History of rectal carcinoma with laryngectomy and radiation chemotherapy and radiation. Electronically Signed   By: Thyra Nash M.D.   On: 09/25/2023 08:05   IR Angiogram Follow Up Study Result Date: 09/25/2023 CLINICAL DATA:  Recurrent episodes of copious oral hemorrhage. Past history of orolaryngeal cancer status post multiple surgeries, radiation treatment and chemotherapy. EXAM: IR ANGIO EXTERNAL CAROTID SEL EXT CAROTID BILAT MOD SED COMPARISON:  CT of the 11/14/2022 MEDICATIONS: Heparin  none units IV. No antibiotic was administered within 1 hour of the procedure. ANESTHESIA/SEDATION: General anesthesia. CONTRAST:  Omnipaque  300 approximately 250 mL. FLUOROSCOPY TIME:  Fluoroscopy Time: 51 minutes 36 seconds (572 mGy). COMPLICATIONS: None immediate. TECHNIQUE: Informed witnessed written consent was obtained from the patient's daughter after a thorough discussion of the procedural risks, benefits and alternatives. Risks of thromboembolic stroke, vessel injury at the site of entry, infection, bleeding, renal failure related to the contrast, and not limited to the above mentioned potential risks, were discussed in detail with the daughter over the phone. All questions were addressed. Maximal Sterile Barrier Technique was utilized including caps, mask, sterile gowns, sterile gloves, sterile drape, hand hygiene and skin antiseptic. A timeout was performed prior to the initiation of the procedure. The right groin was prepped and draped in the usual sterile fashion. Thereafter using modified Seldinger technique, transfemoral access into the right common  femoral artery was obtained without difficulty. Over an 0.035 inch guidewire, a 7 French 65 cm Arrow neurovascular sheath was inserted. Through this, and also over an 0.035 inch guidewire, a 6 French 95 cm Simmons 2 Envoy guide catheter was advanced to the aortic arch region, and selectively positioned in the right common carotid artery and the left common carotid artery. Arteriograms were then obtained centered intracranially, and extracranially. Also performed was an initial left vertebral artery arteriogram using a 5 French diagnostic catheter. FINDINGS: Dominant left vertebral artery origin demonstrates mild stenosis at its origin. The vessel is seen to opacify to the cranial skull base with multiple focal areas of mild smooth irregularity associated with outpouching especially the distal 2/3. Patency is seen of the left vertebrobasilar junction and hypoplastic left posterior-inferior cerebellar artery. The basilar artery, the posterior cerebral arteries, the superior cerebellar arteries and the anterior-inferior cerebellar arteries demonstrate patency into the capillary and venous phases. Patency of the dural venous sinuses maintained on delayed venous phase. Multiple focal areas of smooth  narrowing are evident in the distal basilar artery, the posterior cerebral arteries proximally and distally. Dominant artery demonstrates moderate tortuosity with acute angulation at its bifurcation with the subclavian and the right common carotid artery proximally. Focal areas of mild narrowing are evident in the distal 1/3 of the right subclavian artery. A hypoplastic right vertebral artery is seen projecting intracranially with multiple focal areas of irregularity associated with mild narrowing. Right common carotid arteriogram demonstrates irregularity of the distal 2/3 of the right common carotid artery associated with mild narrowing of the distal 1/3. The right external carotid artery demonstrates moderate narrowing  associated with irregularity of the occipital artery, and the internal maxillary artery. An anteriorly protruding pouch is seen with to and fro motion at the level of the previous surgical clips noted in the region of the proximal right external carotid artery. The right internal carotid artery at the bulb demonstrates mild fusiform dilatation with a tapered stenosis of a significant degree at the cervical petrous junction extending into the proximal petrous segment. The right ICA petrous and cavernous segments are widely patent. Patency is seen of the right middle cerebral artery and the right anterior cerebral artery into the capillary and venous phases. Brisk cross-filling via the anterior communicating artery of the left anterior cerebral artery and left middle cerebral artery is noted. ENDOVASCULAR EMBOLIZATION OF THE RIGHT EXTERNAL CAROTID ARTERY SMALL PSEUDOANEURYSM, AND OF THE LARGE LEFT EXTERNAL CAROTID ARTERY PSEUDOANEURYSM USING BARE METAL COILS Through the 6 French Simmons 2 Envoy guide catheter in the right common carotid artery, an 017 150 cm Phenom 2 tip microcatheter was advanced over an 0.014 inch standard Aristotle micro guidewire and positioned in the proximal right external carotid artery carotid artery. Guidewire was removed. Good aspiration obtained from the hub of the microcatheter. A gentle control arteriogram performed through the microcatheter demonstrated safe positioning of the tip of the microcatheter. Coil embolization with bare metal coils was then performed from the distal proximal location occluding the origin of the right occipital artery, and the right internal maxillary artery proximally. There was complete stasis of contrast in the origin of the right external carotid artery without opacification of the external carotid artery branches. The 6 [place] Envoy guide catheter was then positioned in the left common carotid artery. Control angiogram performed demonstrated multiple focal  areas of irregularity with moderate narrowing at the of the proximal left common carotid artery. Complete occlusion of the left internal carotid artery was also noted. Left external carotid artery demonstrated a tapered severe stenosis leading up to a lobulated dense contrast presenting a pseudoaneurysm with extravasation noted projecting medially and anteriorly. An 017 microcatheter was then advanced quickly into the pseudoaneurysm over an 0.014 inch Aristotle micro guidewire. Multiple coils were then advanced into the pseudoaneurysm covered the point of extravasation with significant slowing. Occlusion was obtained of the left occipital artery, and the superficial temporal arteries. The external carotid artery distal to the pseudoaneurysm was also occluded with bare metal coils. A final control arteriogram performed through the left external carotid artery demonstrated dense packing of the pseudoaneurysm and of the surrounding branches. Due to the bleeding, patient's blood pressure required vasopressor support and IV boluses of fluids per anesthesia. He was noted to be pulseless. Full cardiopulmonary resuscitation was immediately started by the anesthesia team without success. Patient was pronounced deceased by the code blue team at 11:09 p.m. IMPRESSION: Status post endovascular embolization of pseudoaneurysm of the larger left external carotid with extravasation to near complete stasis with  bare metal endovascular coils, and of the smaller right external carotid artery with bare metal endovascular coils to stasis. Extensive changes involving the right internal carotid artery extra cranially and intracranially, and of the posterior circulation likely related to a combination of previous radiation, and diffuse intracranial and extracranial atherosclerotic changes. PLAN: As per referring MD. INDICATION: Recurrent oro pharyngeal bleeding. History of rectal carcinoma with laryngectomy and radiation chemotherapy and  radiation. Electronically Signed   By: Thyra Nash M.D.   On: 09/25/2023 08:05   IR Transcath/Emboliz Result Date: 09/25/2023 CLINICAL DATA:  Recurrent episodes of copious oral hemorrhage. Past history of orolaryngeal cancer status post multiple surgeries, radiation treatment and chemotherapy. EXAM: IR ANGIO EXTERNAL CAROTID SEL EXT CAROTID BILAT MOD SED COMPARISON:  CT of the 11/14/2022 MEDICATIONS: Heparin  none units IV. No antibiotic was administered within 1 hour of the procedure. ANESTHESIA/SEDATION: General anesthesia. CONTRAST:  Omnipaque  300 approximately 250 mL. FLUOROSCOPY TIME:  Fluoroscopy Time: 51 minutes 36 seconds (572 mGy). COMPLICATIONS: None immediate. TECHNIQUE: Informed witnessed written consent was obtained from the patient's daughter after a thorough discussion of the procedural risks, benefits and alternatives. Risks of thromboembolic stroke, vessel injury at the site of entry, infection, bleeding, renal failure related to the contrast, and not limited to the above mentioned potential risks, were discussed in detail with the daughter over the phone. All questions were addressed. Maximal Sterile Barrier Technique was utilized including caps, mask, sterile gowns, sterile gloves, sterile drape, hand hygiene and skin antiseptic. A timeout was performed prior to the initiation of the procedure. The right groin was prepped and draped in the usual sterile fashion. Thereafter using modified Seldinger technique, transfemoral access into the right common femoral artery was obtained without difficulty. Over an 0.035 inch guidewire, a 7 French 65 cm Arrow neurovascular sheath was inserted. Through this, and also over an 0.035 inch guidewire, a 6 French 95 cm Simmons 2 Envoy guide catheter was advanced to the aortic arch region, and selectively positioned in the right common carotid artery and the left common carotid artery. Arteriograms were then obtained centered intracranially, and  extracranially. Also performed was an initial left vertebral artery arteriogram using a 5 French diagnostic catheter. FINDINGS: Dominant left vertebral artery origin demonstrates mild stenosis at its origin. The vessel is seen to opacify to the cranial skull base with multiple focal areas of mild smooth irregularity associated with outpouching especially the distal 2/3. Patency is seen of the left vertebrobasilar junction and hypoplastic left posterior-inferior cerebellar artery. The basilar artery, the posterior cerebral arteries, the superior cerebellar arteries and the anterior-inferior cerebellar arteries demonstrate patency into the capillary and venous phases. Patency of the dural venous sinuses maintained on delayed venous phase. Multiple focal areas of smooth narrowing are evident in the distal basilar artery, the posterior cerebral arteries proximally and distally. Dominant artery demonstrates moderate tortuosity with acute angulation at its bifurcation with the subclavian and the right common carotid artery proximally. Focal areas of mild narrowing are evident in the distal 1/3 of the right subclavian artery. A hypoplastic right vertebral artery is seen projecting intracranially with multiple focal areas of irregularity associated with mild narrowing. Right common carotid arteriogram demonstrates irregularity of the distal 2/3 of the right common carotid artery associated with mild narrowing of the distal 1/3. The right external carotid artery demonstrates moderate narrowing associated with irregularity of the occipital artery, and the internal maxillary artery. An anteriorly protruding pouch is seen with to and fro motion at the level of the previous  surgical clips noted in the region of the proximal right external carotid artery. The right internal carotid artery at the bulb demonstrates mild fusiform dilatation with a tapered stenosis of a significant degree at the cervical petrous junction extending  into the proximal petrous segment. The right ICA petrous and cavernous segments are widely patent. Patency is seen of the right middle cerebral artery and the right anterior cerebral artery into the capillary and venous phases. Brisk cross-filling via the anterior communicating artery of the left anterior cerebral artery and left middle cerebral artery is noted. ENDOVASCULAR EMBOLIZATION OF THE RIGHT EXTERNAL CAROTID ARTERY SMALL PSEUDOANEURYSM, AND OF THE LARGE LEFT EXTERNAL CAROTID ARTERY PSEUDOANEURYSM USING BARE METAL COILS Through the 6 French Simmons 2 Envoy guide catheter in the right common carotid artery, an 017 150 cm Phenom 2 tip microcatheter was advanced over an 0.014 inch standard Aristotle micro guidewire and positioned in the proximal right external carotid artery carotid artery. Guidewire was removed. Good aspiration obtained from the hub of the microcatheter. A gentle control arteriogram performed through the microcatheter demonstrated safe positioning of the tip of the microcatheter. Coil embolization with bare metal coils was then performed from the distal proximal location occluding the origin of the right occipital artery, and the right internal maxillary artery proximally. There was complete stasis of contrast in the origin of the right external carotid artery without opacification of the external carotid artery branches. The 6 [place] Envoy guide catheter was then positioned in the left common carotid artery. Control angiogram performed demonstrated multiple focal areas of irregularity with moderate narrowing at the of the proximal left common carotid artery. Complete occlusion of the left internal carotid artery was also noted. Left external carotid artery demonstrated a tapered severe stenosis leading up to a lobulated dense contrast presenting a pseudoaneurysm with extravasation noted projecting medially and anteriorly. An 017 microcatheter was then advanced quickly into the pseudoaneurysm  over an 0.014 inch Aristotle micro guidewire. Multiple coils were then advanced into the pseudoaneurysm covered the point of extravasation with significant slowing. Occlusion was obtained of the left occipital artery, and the superficial temporal arteries. The external carotid artery distal to the pseudoaneurysm was also occluded with bare metal coils. A final control arteriogram performed through the left external carotid artery demonstrated dense packing of the pseudoaneurysm and of the surrounding branches. Due to the bleeding, patient's blood pressure required vasopressor support and IV boluses of fluids per anesthesia. He was noted to be pulseless. Full cardiopulmonary resuscitation was immediately started by the anesthesia team without success. Patient was pronounced deceased by the code blue team at 11:09 p.m. IMPRESSION: Status post endovascular embolization of pseudoaneurysm of the larger left external carotid with extravasation to near complete stasis with bare metal endovascular coils, and of the smaller right external carotid artery with bare metal endovascular coils to stasis. Extensive changes involving the right internal carotid artery extra cranially and intracranially, and of the posterior circulation likely related to a combination of previous radiation, and diffuse intracranial and extracranial atherosclerotic changes. PLAN: As per referring MD. INDICATION: Recurrent oro pharyngeal bleeding. History of rectal carcinoma with laryngectomy and radiation chemotherapy and radiation. Electronically Signed   By: Thyra Nash M.D.   On: 09/25/2023 08:05    Microbiology: Results for orders placed or performed during the hospital encounter of 2023/09/29  MRSA Next Gen by PCR, Nasal     Status: None   Collection Time: 09-29-23  7:35 PM   Specimen: Nasal Mucosa; Nasal Swab  Result  Value Ref Range Status   MRSA by PCR Next Gen NOT DETECTED NOT DETECTED Final    Comment: (NOTE) The GeneXpert MRSA  Assay (FDA approved for NASAL specimens only), is one component of a comprehensive MRSA colonization surveillance program. It is not intended to diagnose MRSA infection nor to guide or monitor treatment for MRSA infections. Test performance is not FDA approved in patients less than 74 years old. Performed at [facility], [address], [place], [place] [address]     Labs: CBC: Recent Labs  Lab 10/18/[phone] October 18, 2023 0819 Oct 18, 2023 1740  WBC 11.0*  --  14.5*  NEUTROABS 7.9*  --   --   HGB 8.0* 8.7* 8.3*  HCT 26.5* 28.1* 27.0*  MCV 95.7  --  93.8  PLT 263  --  254   Basic Metabolic Panel: Recent Labs  Lab 10/18/23 0527  NA 136  K 3.7  CL 101  CO2 26  GLUCOSE 109*  BUN 28*  CREATININE 0.95  CALCIUM  7.6*   Liver Function Tests: No results for input(s): AST, ALT, ALKPHOS, BILITOT, PROT, ALBUMIN  in the last 168 hours. CBG: Recent Labs  Lab 18-Oct-2023 1922  GLUCAP 78    Discharge time spent: greater than 30 minutes.  Signed: Orlin Fairly, MD [facility] 09/29/2023

## 2023-10-05 NOTE — Plan of Care (Signed)
  Problem: Clinical Measurements: Goal: Will remain free from infection Outcome: Progressing   Problem: Clinical Measurements: Goal: Diagnostic test results will improve Outcome: Progressing   Problem: Nutrition: Goal: Adequate nutrition will be maintained Outcome: Progressing   Problem: Activity: Goal: Risk for activity intolerance will decrease Outcome: Progressing   Problem: Pain Managment: Goal: General experience of comfort will improve and/or be controlled Outcome: Progressing   Problem: Skin Integrity: Goal: Risk for impaired skin integrity will decrease Outcome: Progressing

## 2023-10-05 NOTE — ED Notes (Signed)
 Patient spitted/ vomited bright red blood with clots. Patient cleaned up, suction mouth (right side) and clean linen placed.

## 2023-10-05 NOTE — Code Documentation (Addendum)
  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 Code blue was subsequently called. At the time of arrival on scene, ACLS protocol was underway.   Technical Description:  - CPR performance duration:  24 minutes  - Was defibrillation or cardioversion used? Yes   - Was external pacer placed? No  - Was patient intubated pre/post CPR? Trach already in place   Medications Administered: Y = Yes; Blank = No Amiodarone    Atropine    Calcium   y  Epinephrine   y  Lidocaine     Magnesium   y  Norepinephrine    Phenylephrine   y  Sodium bicarbonate   y  Vasopressin   y  Other    Post CPR evaluation:  - Final Status - Was patient successfully resuscitated ? No   Miscellaneous Information:  - Time of death:  11:09PM  - Primary team notified?  Yes  - Family Notified? Yes     Tobie Gaines, DO   10-05-23, 12:01 AM

## 2023-10-05 NOTE — Progress Notes (Signed)
 Patient was started having oral bleeding around 6pm, EBL was ,  informed to the IR NP, attending MD, IVF was continue, BP was on lower side, IVF continued, patient transferred to IR as per the call received from IR for the procedure but unfortunately procedure couldn't be done at the time so transferred him to 2M08, all the belongings transferred along with the patient.   Updates provided to pt's daughter over phone.

## 2023-10-05 NOTE — Progress Notes (Signed)
 RT called to room because patient was transferred from [facility].  RT explained the total Laryngectomy sign to the RN's present in the room.  Patient was in no distress at the time and O2 saturation was 100% on Room air.  RT explained if patient goes for procedure that the Ambu bag with pediatric mask that is at bedside needs to go with him.  RT will continue to monitor.

## 2023-10-05 NOTE — ED Provider Notes (Signed)
 [place] EMERGENCY DEPARTMENT AT [facility] Provider Note   CSN: [id number] Arrival date & time: 09/24/2023  9671     Patient presents with: Hematemesis   [name] is a 73 y.o. male.   The history is provided by the patient and a relative. The history is limited by the condition of the patient (Patient nonverbal).  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  He had been in the hospital recently and had been evaluated and told that he might need to have the artery going to the bleeding site embolized if there were recurrence.  Of note, blood was coming from his mouth and nose but not through his tracheostomy.  He has chronic left-sided facial pain which is unchanged, denies difficulty breathing.      Prior to Admission medications   Medication Sig Start Date End Date Taking? Authorizing Provider  cephALEXin  (KEFLEX ) 500 MG capsule Take 500 mg by mouth 4 (four) times daily. 09/16/23 09/26/23  [provider]  cyanocobalamin  (VITAMIN B12) 500 MCG tablet Take 2 tablets (1,000 mcg total) by mouth daily. Patient not taking: Reported on 09/17/2023 04/18/22   Regalado, Owen A, MD  dexamethasone  (DECADRON ) 0.75 MG tablet Take 0.75 mg by mouth 3 (three) times daily. 08/27/23   [provider]  diclofenac  Sodium (VOLTAREN ) 1 % GEL Apply 2 g topically 2 (two) times daily as needed (pain).    [provider]  dronabinol  (MARINOL ) 5 MG capsule Take 1 capsule (5 mg total) by mouth 2 (two) times daily before lunch and supper. 05/22/23   Pickenpack-Cousar, Athena N, NP  finasteride  (PROSCAR ) 5 MG tablet Take 1 tablet (5 mg total) by mouth daily. 01/24/23 01/24/24  Rosendo Rush, MD  fludrocortisone  (FLORINEF ) 0.1 MG tablet Take 0.05-0.1 mg by mouth See admin instructions.  Take 0.1 mg by mouth on Sun/Tues/Thurs/Sat and 0.05 mg on Mon/Wed/Fri 04/27/23   [provider]  folic acid  (FOLVITE ) 1 MG tablet Take 1 mg by mouth daily. 02/13/23   [provider]  levETIRAcetam  (KEPPRA ) 100 MG/ML solution Take 5 mLs (500 mg total) by mouth 2 (two) times daily. 05/31/23   Whitfield Raisin, NP  levothyroxine  (SYNTHROID ) 75 MCG tablet Take 75 mcg by mouth daily before breakfast. 09/22/22   [provider]  magic mouthwash SOLN Take 5 mLs by mouth 4 (four) times daily as needed (swish and spit). 06/13/23   Cloretta Arley NOVAK, MD  NEOMYCIN-POLYMYXIN-HYDROCORTISONE  (CORTISPORIN) 1 % SOLN OTIC solution Place 4 drops into the left ear 3 (three) times daily as needed (Irritation). 01/26/23   [provider]  oxyCODONE  (ROXICODONE ) 5 MG/5ML solution Take 2.5-5 mLs (2.5-5 mg total) by mouth every 6 (six) hours as needed for severe pain (pain score 7-10). Patient taking differently: Take 2.5-5 mg by mouth every 6 (six) hours as needed for severe pain (pain score 7-10) (HOLD FOR HYPOTENSION). 09/10/23   Pickenpack-Cousar, Athena N, NP  rosuvastatin  (CRESTOR ) 5 MG tablet Take 5 mg by mouth at bedtime. 03/12/23   [provider]    Allergies: Other, Succinylcholine, Tylenol  [acetaminophen ], Vicodin [hydrocodone-acetaminophen ], Tamsulosin , Oxycodone , and Protonix  [pantoprazole ]    Review of Systems  Unable to perform ROS: Patient nonverbal    Updated Vital Signs BP (!) 131/99 (BP Location: Right Arm)   Pulse 95   Temp (!) 97.5  F (36.4 C) (Oral)   Resp 15   SpO2 100%   Physical Exam Vitals and nursing note reviewed.   Cachectic appearing 73 year old male, resting comfortably and in no acute distress. Vital signs are significant for elevated diastolic blood pressure. Oxygen  saturation is 100%, which is normal. Head is normocephalic and atraumatic. PERRLA, EOMI. Oropharynx shows no active bleeding and no blood in the mouth.  Examination the nasal  cavities shows no evidence of blood. Neck tracheastoma present. Lungs are clear without rales, wheezes, or rhonchi. Heart has regular rate and rhythm without murmur. Neurologic: Awake and alert, able to answer questions by nodding his head yes or no.  (all labs ordered are listed, but only abnormal results are displayed) Labs Reviewed - No data to display  EKG: None  Radiology: No results found.   Procedures   Medications Ordered in the ED - No data to display                                  Medical Decision Making Amount and/or Complexity of Data Reviewed Labs: ordered.  Risk Prescription drug management. Decision regarding hospitalization.   Recurrent bleeding from cancer of the base of the tongue.  I reviewed his past records, and he had been admitted 09/17/2023-09/20/2023 for similar episode.  There was a note from interventional radiology and from ENT that if he had recurrent bleeding you would need to be transferred to [facility] for arteriogram and embolization.  Current episode of bleeding has stopped, I am ordering screening labs and chest x-ray.  I have reviewed his laboratory tests, and my interpretation is anemia which has progressed compared with 3 days ago, mild leukocytosis which is nonspecific, mildly elevated BUN which is actually decreasing, elevated random glucose.  He has not had any more bleeding, but plan had been to proceed with embolization if he had recurrent bleeding.  I have discussed case with Dr. Debby of [facility], who agrees to admit the patient.  I have also sent a secure chat to the ENT on-call physician to let them know that they will be on consult.     Final diagnoses:  Bleeding from mouth  Cancer of base of tongue (HCC)  Normochromic normocytic anemia    ED Discharge Orders     None          Raford Lenis, MD 09/27/2023 [phone]

## 2023-10-05 NOTE — Procedures (Signed)
 INR.  Bilateral common carotid, and external carotid arteriograms.  Right CFA approach.  Findings.  1.  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  Status post coil embolization of the pseudoaneurysms bilaterally with minimal flow postembolization residual on the left side.  Patient became hemodynamically unstable due to bleeding requiring cardiopulmonary resuscitation by anesthesia which was unsuccessful.   GORMAN Nash MD. .

## 2023-10-05 NOTE — Progress Notes (Addendum)
 [name], male   DOB: 1950-08-23, 73 y.o.   MRN: [id number] Pt known to [facility] team from recent consultation on 7/15 in [facility] ED to evaluate candidacy for common/external carotid arteriogram with possible embolization in setting of recurrent oropharyngeal bleeding from head/neck cancer. At time of previous evaluation pt's bleeding had subsided during his hospital stay and hgb remained stable. Dr. Dolphus reviewed case and stated that if rebleeding occurred pt would need transfer to [facility] for angio with embolization as this procedure is not performed at [facility]. Pt was subsequently discharged home on 7/17. He presented to [facility] ED today with recurrent bleeding which started last night. He is afebrile, hgb 8.7, plts nl, PT/INR 16/1.2, creat nl. Above d/w Drs. Deveshwar/Ikramullah. Plan is for transfer of pt to [facility] once bed available and he will be evaluated again in am for potential common/external carotid arteriogram with embolization with anesthesia assistance. If bleeding becomes more significant pt will be intervened on sooner. Pt's daughter aware of plans.

## 2023-10-05 NOTE — Anesthesia Preprocedure Evaluation (Addendum)
 Anesthesia Evaluation  Patient identified by MRN, date of birth, ID band  Reviewed: Allergy & Precautions, [name] Chart, lab work & pertinent test results, Unable to perform ROS - Chart review only  History of Anesthesia Complications (+) history of anesthetic complications (hyperkalemic vs resp arrest)  Airway Mallampati: Trach       Dental  (+) Edentulous Upper, Edentulous Lower   Pulmonary former smoker Trach s/p laryngectomy    + decreased breath sounds      Cardiovascular hypertension, + Peripheral Vascular Disease and +CHF   Rhythm:Regular Rate:Tachycardia  Mitodrine BP support  chronic systolic CHF (due to NICM likely related to ETOH/uncontrolled HTN 12/2011, EF 15-20%, normal coronaries 12/28/11->EF 55% 05/2022; 50-55% 08/2022),  08/2022 ECHO: ([facility]): -  Left Ventricle: Left ventricle size is normal.    Left Ventricle: Systolic function is low normal. EF: 50-55%.    Left Ventricle: Doppler parameters consistent with mild diastolic dysfunction and low to normal LA pressure.    Right Ventricle: Right ventricle size is normal.    Right Ventricle: Systolic function is normal.    Tricuspid Valve: The right ventricular systolic pressure is mildly elevated (37-49 mmHg).     Neuro/Psych Seizures - (Keppra ), Well Controlled,  PSYCHIATRIC DISORDERS      H/o meningioma    GI/Hepatic negative GI ROS,,,(+) Cirrhosis         Endo/Other  Hypothyroidism    Renal/GU Renal InsufficiencyRenal disease     Musculoskeletal  (+) Arthritis ,    Abdominal   Peds  Hematology  (+) Blood dyscrasia, anemia   Anesthesia Other Findings S/p laryngectomy, Laryngeal cancer: trach  [facility]:  [facility] cardiologist Dr. Uvaldo on 08/08/22 for hypotension in setting of severe protein calorie deficit. Orthostatic vitals were positive. By Narrative, EKG showed SR, LAE, possible LVH, negative T waves. He was started on midodrine   and an echocardiogram was ordered. 08/08/22 echo showed normal LV size, LVEF 50-55%, LV wall motion was normal, mild diastolic dysfunction, normal RV size, normal RV systolic function, RVSP mildly elevated at 37-49 mmHg. (Of note midodrine  dose decreased on 09/14/22 after BP at PCP office was 170/90.  Reproductive/Obstetrics                              Anesthesia Physical Anesthesia Plan  ASA: 4 and emergent  Anesthesia Plan: MAC   Post-op Pain Management:    Induction: Intravenous  PONV Risk Score and Plan: 2 and Ondansetron , Propofol  infusion, TIVA and Treatment may vary due to age or medical condition  Airway Management Planned: Tracheostomy  Additional Equipment: None  Intra-op Plan:   Post-operative Plan:   Informed Consent: I have reviewed the patients History and Physical, chart, labs and discussed the procedure including the risks, benefits and alternatives for the proposed anesthesia with the patient or authorized representative who has indicated his/her understanding and acceptance.     Consent reviewed with POA and Only emergency history available  Plan Discussed with: CRNA  Anesthesia Plan Comments: (PAT note written 09/18/2022 by Allison Zelenak, PA-C.  )        Anesthesia Quick Evaluation

## 2023-10-05 NOTE — Progress Notes (Signed)
 Pt arrived in the unit, patient is alert, not able to speak due to tracheostomy stoma, he is following commands, CCMD notified, V/S obtained, CHG bath given, all needs met, call bell in reach. His HR is in 120s to 130, will notify MD.   09/17/2023 1613  Vitals  Temp 98.8 F (37.1 C)  Temp Source Oral  BP (!) 119/91  MAP (mmHg) 100  BP Location Right Arm  BP Method Automatic  Patient Position (if appropriate) Lying  Pulse Rate (!) 127  Pulse Rate Source Monitor  ECG Heart Rate (!) 127  Resp 20  Level of Consciousness  Level of Consciousness Alert  MEWS COLOR  MEWS Score Color Yellow  Oxygen  Therapy  SpO2 100 %  O2 Device Room Air  Pain Assessment  Pain Scale Faces  Faces Pain Scale 0  MEWS Score  MEWS Temp 0  MEWS Systolic 0  MEWS Pulse 2  MEWS RR 0  MEWS LOC 0  MEWS Score 2

## 2023-10-05 NOTE — Progress Notes (Signed)
 Evaluating patient at bedside with Dr. Maree. Patient moved to ICU for airway monitoring with bleeding from tongue base cancer. Initial plan was IR in morning, however, is actively bleeding. IR is going to take him now for embolization. Anesthesia is aware. He is currently satting okay on room air through his stoma, but will provide supplemental O2 while he is transported.
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
## 2023-10-05 NOTE — ED Triage Notes (Signed)
 BIBA from home.  Was throwing up blood 15 minutes before ems arrived  EMS states trach can was full of blood  Hx Throat cancer, Orthostatic Hypotension  BP 70/48 95% RA RR 20

## 2023-10-05 NOTE — Progress Notes (Signed)
 eLink Physician-Brief Progress Note Patient Name: [name] DOB: 05/19/1950 MRN: [id number]   Date of Service  09/13/2023  HPI/Events of Note  73 year old male admitted to ICU for close monitoring following recurrent oral bleeding.  He has a history of laryngeal and base of tongue cancer status post chemoradiation.  He initially was hypotensive and tachycardic but hypotension has since resolved.  He remains tachycardic.  Heart rate is around 120.  eICU Interventions  [name] chart reviewed.  Pertinent labs and imaging studies reviewed.  Video assessment of patient done.  He does not appear to be in any distress.  No active bleeding noted. Impression: Recurrent oral bleeding Acute blood loss anemia Plan: Monitor closely in ICU. Check CBC every 4 hours overnight. Transfuse for any blood loss. Currently not requiring intubation but we will plan to intubate if airway is compromised. For IR embolization in the morning or earlier if needed. Discussed with bedside RN.     Intervention Category Evaluation Type: New Patient Evaluation  Jerilynn Berg 09/25/2023, 7:37 PM

## 2023-10-05 NOTE — Progress Notes (Signed)
 RT placed Neck Breather sign and Adult AMBU bag with pediatric mask attached at head of bed. RN aware and confirms aware that PT is a total neck breather and to apply AMBU to neck if resuscitation is needed.

## 2023-10-05 NOTE — Plan of Care (Addendum)
 Received a call from good response team team Dr. Tobie and ICU that Patient was in the IR  the IR room for an embolization procedure and was actively bleeding.  Due to significant bleeding patient became hypotensive and eventually decompensated and unfortunately patient was he passed away on October 09, 2307.  Time of death is 11:09 pm 2023/09/29.  Per chart review patient was initially admitted under TRH service but later on he has been transferred to ICU in the setting of hypotension and oral bleeding concern for need of airway protection.   Matylda Fehring, MD [facility] September 29, 2023, 11:23 PM

## 2023-10-05 NOTE — Progress Notes (Addendum)
 Patient is a 73 yo m with throat cancer, transferred earlier today from [place] [facility] for intermittent oozing from his mouth cancer. Reason for transfer is for neuro IR to do angiography Monday morning.   I was messaged by the nurse around 4:50 pm patient started bleeding profusely from his mouth, systolic blood pressure into the 70/80, and heart rate 140s. Dr Celinda was closer to the patients room and evaluated first, within 10 minutes I also evaluated patient at bedside, he is uncomfortable but in no apparent distress and on room air. Blood is being suctioned. He is alert and unable to verbalize due to his mouth cancer history. Rapid response and respiratory called as well.  -stat type and screen and CBC -stat IV bolus wide open -I spoke with Dr Dolphus over the phone about the case, patient is scheduled for angiography tomorrow. By the time I spoke with neuro IR, patients blood pressure stabilized to ~110 systolic and hrart rate improved, Everitt Dolphus still wants to maintain the schedule for tomorrow since he is stable at this point -PCCM consulted, I would favor being monitored in the ICU until his procedures, if he bleeds again he is at risk for respiratory compromise and might need to be intubated  Addendum: received page 18:36 that patient is bleeding again. He is in the process of transferring to ICU, 27M. I paged Dr Dolphus to update, will take patient to IR after his current case. PCCM updates as well.   The patient is critically ill with multiple organ system failure and requires high complexity decision making for assessment and support, frequent evaluation and titration of therapies, advanced monitoring, review of radiographic studies and interpretation of complex data.    Critical Care Time devoted to patient care services, exclusive of separately billable procedures, described in this note is 50 minutes.   Treydon Henricks M. Trixie, MD, PhD [facility]  Between 7 am - 7 pm you can  contact me via Amion (for emergencies) or Securechat (non urgent matters).  I am not available 7 pm - 7 am, please contact night coverage MD/APP via Amion

## 2023-10-05 NOTE — Progress Notes (Signed)
 Patient was having active oral bleeding, EBL was approx , BP was on lower side, HR was in 140s, informed to the attending MD, rapid response and respiratory therapist.   09/10/2023 1642  Vitals  BP (!) 77/49  MAP (mmHg) (!) 60  BP Location Right Arm  BP Method Automatic  Patient Position (if appropriate) Lying  ECG Heart Rate (!) 142  Resp (!) 23  MEWS COLOR  MEWS Score Color Red  MEWS Score  MEWS Temp 0  MEWS Systolic 2  MEWS Pulse 3  MEWS RR 1  MEWS LOC 0  MEWS Score 6

## 2023-10-05 DEATH — deceased

## 2023-10-11 ENCOUNTER — Ambulatory Visit: Admitting: Oncology
